# Patient Record
Sex: Male | Born: 1965 | Hispanic: Yes | Marital: Married | Smoking: Former smoker
Health system: Southern US, Community
[De-identification: ages and names within clinical notes are randomized; demographics above are authoritative.]

## PROBLEM LIST (undated history)

## (undated) DIAGNOSIS — E860 Dehydration: Secondary | ICD-10-CM

## (undated) DIAGNOSIS — F329 Major depressive disorder, single episode, unspecified: Secondary | ICD-10-CM

## (undated) DIAGNOSIS — I5042 Chronic combined systolic (congestive) and diastolic (congestive) heart failure: Secondary | ICD-10-CM

## (undated) DIAGNOSIS — R7989 Other specified abnormal findings of blood chemistry: Secondary | ICD-10-CM

## (undated) DIAGNOSIS — Z8673 Personal history of transient ischemic attack (TIA), and cerebral infarction without residual deficits: Secondary | ICD-10-CM

## (undated) DIAGNOSIS — E7800 Pure hypercholesterolemia, unspecified: Secondary | ICD-10-CM

## (undated) DIAGNOSIS — I2583 Coronary atherosclerosis due to lipid rich plaque: Secondary | ICD-10-CM

## (undated) DIAGNOSIS — I255 Ischemic cardiomyopathy: Secondary | ICD-10-CM

## (undated) DIAGNOSIS — E119 Type 2 diabetes mellitus without complications: Secondary | ICD-10-CM

## (undated) DIAGNOSIS — Z720 Tobacco use: Secondary | ICD-10-CM

## (undated) DIAGNOSIS — R55 Syncope and collapse: Secondary | ICD-10-CM

## (undated) DIAGNOSIS — I110 Hypertensive heart disease with heart failure: Secondary | ICD-10-CM

## (undated) DIAGNOSIS — I200 Unstable angina: Secondary | ICD-10-CM

## (undated) DIAGNOSIS — I214 Non-ST elevation (NSTEMI) myocardial infarction: Secondary | ICD-10-CM

## (undated) DIAGNOSIS — Z955 Presence of coronary angioplasty implant and graft: Secondary | ICD-10-CM

## (undated) DIAGNOSIS — I2510 Atherosclerotic heart disease of native coronary artery without angina pectoris: Secondary | ICD-10-CM

## (undated) DIAGNOSIS — D649 Anemia, unspecified: Secondary | ICD-10-CM

## (undated) DIAGNOSIS — F32A Depression, unspecified: Secondary | ICD-10-CM

## (undated) DIAGNOSIS — I10 Essential (primary) hypertension: Secondary | ICD-10-CM

## (undated) DIAGNOSIS — N186 End stage renal disease: Secondary | ICD-10-CM

## (undated) DIAGNOSIS — R079 Chest pain, unspecified: Secondary | ICD-10-CM

## (undated) DIAGNOSIS — E1121 Type 2 diabetes mellitus with diabetic nephropathy: Secondary | ICD-10-CM

## (undated) DIAGNOSIS — R778 Other specified abnormalities of plasma proteins: Secondary | ICD-10-CM

## (undated) DIAGNOSIS — Z992 Dependence on renal dialysis: Secondary | ICD-10-CM

## (undated) DIAGNOSIS — N419 Inflammatory disease of prostate, unspecified: Secondary | ICD-10-CM

## (undated) HISTORY — DX: Chest pain, unspecified: R07.9

## (undated) HISTORY — DX: Atherosclerotic heart disease of native coronary artery without angina pectoris: I25.10

## (undated) HISTORY — DX: Dehydration: E86.0

## (undated) HISTORY — DX: Essential (primary) hypertension: I10

## (undated) HISTORY — DX: Coronary atherosclerosis due to lipid rich plaque: I25.83

## (undated) HISTORY — DX: Personal history of transient ischemic attack (TIA), and cerebral infarction without residual deficits: Z86.73

## (undated) HISTORY — DX: Type 2 diabetes mellitus with diabetic nephropathy: E11.21

## (undated) HISTORY — DX: Presence of coronary angioplasty implant and graft: Z95.5

## (undated) HISTORY — DX: Other specified abnormal findings of blood chemistry: R79.89

## (undated) HISTORY — DX: Unstable angina: I20.0

## (undated) HISTORY — DX: Anemia, unspecified: D64.9

## (undated) HISTORY — DX: Syncope and collapse: R55

## (undated) HISTORY — DX: Hypertensive heart disease with heart failure: I11.0

## (undated) HISTORY — DX: Ischemic cardiomyopathy: I25.5

## (undated) HISTORY — DX: Other specified abnormalities of plasma proteins: R77.8

## (undated) HISTORY — DX: Chronic combined systolic (congestive) and diastolic (congestive) heart failure: I50.42

## (undated) NOTE — *Deleted (*Deleted)
Patient is a 21 year old male with multiple medical problems including CVA with chronic deficits, recent SNF placement, dialysis on Monday Wednesday Friday who is presenting today from skilled facility with hypoxia, respiratory distress.  Patient has recently been treated for pneumonia but symptoms have worsened.  He is awake and alert but has coarse breath sounds throughout, ongoing coughing and satting 100% on nonrebreather.  Patient sputum is green and thick.  He has no evidence of generalized fluid overload.  Patient was placed on high flow nasal cannula which improved his work of breathing.  X-ray shows right lower lobe pneumonia.  Leukocytosis of 23,000 and potassium at this time is normal.  Patient will need admission, IV antibiotics and dialysis while admitted.

---

## 2010-03-03 ENCOUNTER — Inpatient Hospital Stay (HOSPITAL_COMMUNITY)
Admission: EM | Admit: 2010-03-03 | Discharge: 2010-03-05 | Payer: Self-pay | Source: Home / Self Care | Attending: Interventional Cardiology | Admitting: Interventional Cardiology

## 2010-03-03 LAB — POCT CARDIAC MARKERS
CKMB, poc: <1 ng/mL — ABNORMAL LOW (ref 1.0–8.0)
Myoglobin, poc: 51.5 ng/mL (ref 12–200)
Troponin i, poc: <0.05 ng/mL (ref 0.00–0.09)
Troponin i, poc: <0.05 ng/mL (ref 0.00–0.09)

## 2010-03-03 LAB — CBC
HCT: 40.7 % (ref 39.0–52.0)
HCT: 41.6 % (ref 39.0–52.0)
Hemoglobin: 14.2 g/dL (ref 13.0–17.0)
MCH: 29.5 pg (ref 26.0–34.0)
MCHC: 33.7 g/dL (ref 30.0–36.0)
MCV: 87.5 fL (ref 78.0–100.0)
RDW: 12.6 % (ref 11.5–15.5)
WBC: 7.6 10*3/uL (ref 4.0–10.5)
WBC: 11.1 10*3/uL — ABNORMAL HIGH (ref 4.0–10.5)

## 2010-03-03 LAB — DIFFERENTIAL
Eosinophils Relative: 3 % (ref 0–5)
Lymphocytes Relative: 21 % (ref 12–46)
Lymphs Abs: 1.6 10*3/uL (ref 0.7–4.0)
Monocytes Absolute: 0.7 10*3/uL (ref 0.1–1.0)
Monocytes Relative: 9 % (ref 3–12)

## 2010-03-03 LAB — COMPREHENSIVE METABOLIC PANEL
ALT: 13 U/L (ref 0–53)
Alkaline Phosphatase: 113 U/L (ref 39–117)
Chloride: 103 mEq/L (ref 96–112)
Glucose, Bld: 280 mg/dL — ABNORMAL HIGH (ref 70–99)
Potassium: 4.4 mEq/L (ref 3.5–5.1)
Sodium: 137 mEq/L (ref 135–145)
Total Protein: 6.4 g/dL (ref 6.0–8.3)

## 2010-03-03 LAB — GLUCOSE, CAPILLARY: Glucose-Capillary: 139 mg/dL — ABNORMAL HIGH (ref 70–99)

## 2010-03-04 LAB — GLUCOSE, CAPILLARY
Glucose-Capillary: 189 mg/dL — ABNORMAL HIGH (ref 70–99)
Glucose-Capillary: 252 mg/dL — ABNORMAL HIGH (ref 70–99)
Glucose-Capillary: 109 mg/dL — ABNORMAL HIGH (ref 70–99)

## 2010-03-04 LAB — HEMOGLOBIN A1C
Hgb A1c MFr Bld: 11.1 % — ABNORMAL HIGH (ref <5.7)
Mean Plasma Glucose: 272 mg/dL — ABNORMAL HIGH (ref <117)

## 2010-03-05 LAB — LIPID PANEL
Total CHOL/HDL Ratio: 5.8 RATIO
VLDL: 37 mg/dL (ref 0–40)

## 2010-03-15 NOTE — Discharge Summary (Signed)
  Jeffery Young, JOSH NO.:  [id number]  MEDICAL RECORD NO.:  [id number]          PATIENT TYPE:  INP  LOCATION:  [id number]                         FACILITY:  [facility]  PHYSICIAN:  Ezzard Standing, M.D.DATE OF BIRTH:  09/30/1965  DATE OF ADMISSION:  03/03/2010 DATE OF DISCHARGE:  03/05/2010                              DISCHARGE SUMMARY   FINAL DIAGNOSES: 1. Chest pain of undetermined etiology with myocardial infarction     ruled out.     a.     Negative Cardiolite study for ischemia. 2. Tobacco abuse. 3. Non-insulin-dependent diabetes. 4. Hypertension. 5. Hyperlipidemia.  PROCEDURES:  Stress echo, Cardiolite stress test with Lexiscan.  HISTORY OF PRESENT ILLNESS:  This 45 year old Hispanic male moved from [place] recently to [place].  He has ran out of medicines and could not afford them and states his wife has insurance.  He has a history of diabetes for 5 years.  The morning prior to a getting ready for work, he developed sudden onset of substernal chest pressure lasting 30 minutes and resolved, it frightened him and he came to the ER for evaluation, was placed in the Cardiovascular  Diagnostic Unit.  Enzymes were negative.  A stress echocardiogram was negative for ischemia and he achieved a peak heart rate of 137 and stopped because of leg pain. There were no wall motion abnormalities noted on stress echo.  Dr. Tamala Julian admitted into the hospital for further evaluation and I am covering for him this weekend.  He has not had recurrent pain since admission and was to have a Cardiolite stress test in view of the indeterminate stress echo.  Please see the previously dictated history and physical for remainder of the details.  HOSPITAL COURSE:  Lab data showed a normal CBC.  Hemoglobin A1c is 11.1. Cholesterol is 181, triglycerides 185, HDL 31, LDL of 113.  Chemistry panel shows a glucose of 280.  Chest x-ray was normal.  Stress Myoview done on the 29 showed  diaphragmatic attenuation with no evidence of ischemia, nonspecific septal hypokinesis with an ejection fraction of 56%.  The stress echo showed vigorous LV systolic function, but was nondiagnostic because his heart rate was on adequate.  He had no recurrence of chest pain, was counseled to stop smoking.  He was discharged and was advised to get a primary care physician.  He was given referral to [facility].  DISCHARGE MEDICATIONS: 1. Metformin 500 mg b.i.d. resumed. 2. Simvastatin 20 mg daily. 3. Lisinopril 5 mg daily. 4. Aspirin 81 mg daily.  He is to follow up with [facility].  If he has recurrent pain, this should be followed up with Dr. Daneen Schick.     Ezzard Standing, M.D.     WST/MEDQ  D:  03/05/2010  T:  03/05/2010  Job:  [id number]  cc:   Belva Crome, M.D. Oncologist Signed by Viona Gilmore. Wynonia Lawman M.D. on 03/15/2010 09:22:41 AM

## 2010-03-16 NOTE — H&P (Signed)
Jeffery Young, Jeffery Young NO.:  [id number]  MEDICAL RECORD NO.:  [id number]          PATIENT TYPE:  INP  LOCATION:  [id number]                         FACILITY:  [facility]  PHYSICIAN:  Belva Crome, M.D.   DATE OF BIRTH:  03/11/1965  DATE OF ADMISSION:  03/03/2010 DATE OF DISCHARGE:                             HISTORY & PHYSICAL   No primary care physician, recently moved to [place] from [place].  REASON FOR ADMISSION:  Chest pain with suboptimal/indeterminate stress echo from CDU.  SUBJECTIVE:  The patient is 45 years of age.  He is a diabetic who is not on therapy.  He did not have insurance and ran out of medications and could not afford them.  He has a history of diabetes for approximately 5 years.  This morning prior to getting ready go to work, he developed sudden onset of substernal chest pressure.  It lasted approximately 30 minutes and then resolved.  It frightened him and he came to the ER for evaluation.  He was placed on CDU.  This morning he underwent a stress echo.  He was unable to achieve a target heart rate of 158 beats per minute.  He achieved a peak heart rate of 137. Exercise was stopped because of leg pain.  There was a question of EKG changes that were concerning.  There were no wall motion abnormalities noted on stress echo.  Cardiac markers x4 have been negative.  Chest pain has not recurred.  Because of the patient's risk profile, we felt that he needed to have cardiology consultation.  HABITS:  Smokes cigarettes one pack per day for greater than 20 years. Does not drink alcohol.  FAMILY HISTORY:  Mother died of cancer, had history of hypertension. Does not know his father's history.  He has one brother who had an MI at age 85 and other brothers had a CVA, age 53.  Two other brothers and one sister in good health.  ALLERGIES:  None.  MEDICATIONS:  Was previously taking metformin 500 mg b.i.d., simvastatin 10 mg per day, lisinopril 5 mg  per day.  Have not been on them for quite some time because he lost his insurance and is unable to afford the medications.  SOCIAL HISTORY:  He is married.  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  SIGNIFICANT PAST MEDICAL PROBLEMS: 1. Diabetes x6 years. 2. Hypertension. 3. Hyperlipidemia.  REVIEW OF SYSTEMS:  Has aches and pains in his muscles.  He had to stop walking on a treadmill at this point because both calves became really sore and tender.  He did not have any chest pain with exercise. Otherwise unremarkable ROS.  OBJECTIVE:  VITAL SIGNS:  Blood pressure 158/80, heart rate 60. SKIN:  Clear.  No xanthelasma. NECK:  No JVD or carotid bruits. LUNGS:  Clear. CARDIAC:  No gallop, rub, click, or murmur. ABDOMEN: Soft.  No bruits are heard. EXTREMITIES:  No edema.  Femoral pulses 2+ bilaterally.  Posterior tibial pulses 2+. NEUROLOGICAL:  Unremarkable.  EKG:  QRS pattern V1-V3.  Other laboratory data include potassium of 4.4, creatinine of 0.75, hemoglobin  of 13.7, troponin values were negative.  EKG is has said before unremarkable.  ASSESSMENT:  Atypical chest pain in a patient with multiple high risk markers.  He is an untreated diabetic.  He smokes cigarettes.  Has a family history of vascular disease.  His noninvasive test was nondiagnostic.  PLAN:  We will admit to the hospital for [facility] nuclear study.  If he has normal myocardial perfusion pattern, he will be discharged.  I will encourage him to resume therapy.     Belva Crome, M.D.     HWS/MEDQ  D:  03/03/2010  T:  03/04/2010  Job:  [id number]  Electronically Signed by Daneen Schick M.D. on 03/16/2010 05:00:40 PM

## 2011-04-11 ENCOUNTER — Encounter (INDEPENDENT_AMBULATORY_CARE_PROVIDER_SITE_OTHER): Payer: 59 | Admitting: Ophthalmology

## 2011-04-11 DIAGNOSIS — E11359 Type 2 diabetes mellitus with proliferative diabetic retinopathy without macular edema: Secondary | ICD-10-CM

## 2011-04-11 DIAGNOSIS — H4310 Vitreous hemorrhage, unspecified eye: Secondary | ICD-10-CM

## 2011-04-11 DIAGNOSIS — H43819 Vitreous degeneration, unspecified eye: Secondary | ICD-10-CM

## 2011-04-11 DIAGNOSIS — E11319 Type 2 diabetes mellitus with unspecified diabetic retinopathy without macular edema: Secondary | ICD-10-CM

## 2011-04-11 DIAGNOSIS — E1165 Type 2 diabetes mellitus with hyperglycemia: Secondary | ICD-10-CM

## 2011-04-11 DIAGNOSIS — H2510 Age-related nuclear cataract, unspecified eye: Secondary | ICD-10-CM

## 2011-04-11 DIAGNOSIS — S0510XA Contusion of eyeball and orbital tissues, unspecified eye, initial encounter: Secondary | ICD-10-CM

## 2011-05-14 ENCOUNTER — Encounter (INDEPENDENT_AMBULATORY_CARE_PROVIDER_SITE_OTHER): Payer: 59 | Admitting: Ophthalmology

## 2011-05-14 DIAGNOSIS — H3581 Retinal edema: Secondary | ICD-10-CM

## 2011-05-14 DIAGNOSIS — E11319 Type 2 diabetes mellitus with unspecified diabetic retinopathy without macular edema: Secondary | ICD-10-CM

## 2011-05-14 DIAGNOSIS — E11359 Type 2 diabetes mellitus with proliferative diabetic retinopathy without macular edema: Secondary | ICD-10-CM

## 2011-06-15 ENCOUNTER — Other Ambulatory Visit (INDEPENDENT_AMBULATORY_CARE_PROVIDER_SITE_OTHER): Payer: 59 | Admitting: Ophthalmology

## 2011-06-15 DIAGNOSIS — E1165 Type 2 diabetes mellitus with hyperglycemia: Secondary | ICD-10-CM

## 2011-06-15 DIAGNOSIS — E11359 Type 2 diabetes mellitus with proliferative diabetic retinopathy without macular edema: Secondary | ICD-10-CM

## 2011-10-15 ENCOUNTER — Ambulatory Visit (INDEPENDENT_AMBULATORY_CARE_PROVIDER_SITE_OTHER): Payer: 59 | Admitting: Ophthalmology

## 2011-10-15 DIAGNOSIS — E11319 Type 2 diabetes mellitus with unspecified diabetic retinopathy without macular edema: Secondary | ICD-10-CM

## 2011-10-15 DIAGNOSIS — H2510 Age-related nuclear cataract, unspecified eye: Secondary | ICD-10-CM

## 2011-10-15 DIAGNOSIS — H4310 Vitreous hemorrhage, unspecified eye: Secondary | ICD-10-CM

## 2011-10-15 DIAGNOSIS — E11359 Type 2 diabetes mellitus with proliferative diabetic retinopathy without macular edema: Secondary | ICD-10-CM

## 2011-10-15 DIAGNOSIS — E1165 Type 2 diabetes mellitus with hyperglycemia: Secondary | ICD-10-CM

## 2011-10-19 ENCOUNTER — Ambulatory Visit (INDEPENDENT_AMBULATORY_CARE_PROVIDER_SITE_OTHER): Payer: 59 | Admitting: Ophthalmology

## 2011-10-19 DIAGNOSIS — E1139 Type 2 diabetes mellitus with other diabetic ophthalmic complication: Secondary | ICD-10-CM

## 2011-10-19 DIAGNOSIS — E11311 Type 2 diabetes mellitus with unspecified diabetic retinopathy with macular edema: Secondary | ICD-10-CM

## 2011-10-19 DIAGNOSIS — E1165 Type 2 diabetes mellitus with hyperglycemia: Secondary | ICD-10-CM

## 2011-10-19 DIAGNOSIS — H2510 Age-related nuclear cataract, unspecified eye: Secondary | ICD-10-CM

## 2011-10-19 DIAGNOSIS — H4310 Vitreous hemorrhage, unspecified eye: Secondary | ICD-10-CM

## 2011-11-16 ENCOUNTER — Encounter (INDEPENDENT_AMBULATORY_CARE_PROVIDER_SITE_OTHER): Payer: 59 | Admitting: Ophthalmology

## 2011-12-14 ENCOUNTER — Encounter (INDEPENDENT_AMBULATORY_CARE_PROVIDER_SITE_OTHER): Payer: 59 | Admitting: Ophthalmology

## 2011-12-14 DIAGNOSIS — H35039 Hypertensive retinopathy, unspecified eye: Secondary | ICD-10-CM

## 2011-12-14 DIAGNOSIS — H43819 Vitreous degeneration, unspecified eye: Secondary | ICD-10-CM

## 2011-12-14 DIAGNOSIS — E1165 Type 2 diabetes mellitus with hyperglycemia: Secondary | ICD-10-CM

## 2011-12-14 DIAGNOSIS — H2700 Aphakia, unspecified eye: Secondary | ICD-10-CM

## 2011-12-14 DIAGNOSIS — I10 Essential (primary) hypertension: Secondary | ICD-10-CM

## 2011-12-14 DIAGNOSIS — E11359 Type 2 diabetes mellitus with proliferative diabetic retinopathy without macular edema: Secondary | ICD-10-CM

## 2011-12-14 DIAGNOSIS — E11311 Type 2 diabetes mellitus with unspecified diabetic retinopathy with macular edema: Secondary | ICD-10-CM

## 2012-01-09 ENCOUNTER — Encounter (INDEPENDENT_AMBULATORY_CARE_PROVIDER_SITE_OTHER): Payer: 59 | Admitting: Ophthalmology

## 2012-01-09 DIAGNOSIS — E11359 Type 2 diabetes mellitus with proliferative diabetic retinopathy without macular edema: Secondary | ICD-10-CM

## 2012-01-09 DIAGNOSIS — E11311 Type 2 diabetes mellitus with unspecified diabetic retinopathy with macular edema: Secondary | ICD-10-CM

## 2012-01-09 DIAGNOSIS — E1139 Type 2 diabetes mellitus with other diabetic ophthalmic complication: Secondary | ICD-10-CM

## 2012-01-09 DIAGNOSIS — H43819 Vitreous degeneration, unspecified eye: Secondary | ICD-10-CM

## 2012-01-09 DIAGNOSIS — H2510 Age-related nuclear cataract, unspecified eye: Secondary | ICD-10-CM

## 2012-02-11 ENCOUNTER — Encounter (INDEPENDENT_AMBULATORY_CARE_PROVIDER_SITE_OTHER): Payer: 59 | Admitting: Ophthalmology

## 2012-02-13 ENCOUNTER — Encounter (INDEPENDENT_AMBULATORY_CARE_PROVIDER_SITE_OTHER): Payer: 59 | Admitting: Ophthalmology

## 2012-02-13 DIAGNOSIS — E1165 Type 2 diabetes mellitus with hyperglycemia: Secondary | ICD-10-CM

## 2012-02-13 DIAGNOSIS — H43819 Vitreous degeneration, unspecified eye: Secondary | ICD-10-CM

## 2012-02-13 DIAGNOSIS — I10 Essential (primary) hypertension: Secondary | ICD-10-CM

## 2012-02-13 DIAGNOSIS — H2510 Age-related nuclear cataract, unspecified eye: Secondary | ICD-10-CM

## 2012-02-13 DIAGNOSIS — E11359 Type 2 diabetes mellitus with proliferative diabetic retinopathy without macular edema: Secondary | ICD-10-CM

## 2012-02-13 DIAGNOSIS — H35039 Hypertensive retinopathy, unspecified eye: Secondary | ICD-10-CM

## 2012-02-13 DIAGNOSIS — E11311 Type 2 diabetes mellitus with unspecified diabetic retinopathy with macular edema: Secondary | ICD-10-CM

## 2012-03-14 ENCOUNTER — Encounter (INDEPENDENT_AMBULATORY_CARE_PROVIDER_SITE_OTHER): Payer: BC Managed Care – PPO | Admitting: Ophthalmology

## 2012-08-21 ENCOUNTER — Emergency Department (HOSPITAL_COMMUNITY)
Admission: EM | Admit: 2012-08-21 | Discharge: 2012-08-22 | Disposition: A | Discharge status: Home / Self Care | Payer: BC Managed Care – PPO | Attending: Emergency Medicine | Admitting: Emergency Medicine

## 2012-08-21 ENCOUNTER — Encounter (HOSPITAL_COMMUNITY): Payer: Self-pay | Admitting: Adult Health

## 2012-08-21 ENCOUNTER — Emergency Department (HOSPITAL_COMMUNITY): Payer: BC Managed Care – PPO

## 2012-08-21 DIAGNOSIS — E119 Type 2 diabetes mellitus without complications: Secondary | ICD-10-CM | POA: Insufficient documentation

## 2012-08-21 DIAGNOSIS — F17200 Nicotine dependence, unspecified, uncomplicated: Secondary | ICD-10-CM | POA: Insufficient documentation

## 2012-08-21 DIAGNOSIS — E860 Dehydration: Secondary | ICD-10-CM | POA: Diagnosis present

## 2012-08-21 DIAGNOSIS — E7800 Pure hypercholesterolemia, unspecified: Secondary | ICD-10-CM | POA: Insufficient documentation

## 2012-08-21 DIAGNOSIS — R112 Nausea with vomiting, unspecified: Secondary | ICD-10-CM | POA: Insufficient documentation

## 2012-08-21 DIAGNOSIS — R42 Dizziness and giddiness: Secondary | ICD-10-CM | POA: Diagnosis present

## 2012-08-21 DIAGNOSIS — H5450 Low vision, one eye, unspecified eye: Secondary | ICD-10-CM | POA: Insufficient documentation

## 2012-08-21 DIAGNOSIS — R040 Epistaxis: Secondary | ICD-10-CM | POA: Insufficient documentation

## 2012-08-21 DIAGNOSIS — I10 Essential (primary) hypertension: Secondary | ICD-10-CM | POA: Insufficient documentation

## 2012-08-21 DIAGNOSIS — Z79899 Other long term (current) drug therapy: Secondary | ICD-10-CM | POA: Insufficient documentation

## 2012-08-21 HISTORY — DX: Pure hypercholesterolemia, unspecified: E78.00

## 2012-08-21 HISTORY — DX: Essential (primary) hypertension: I10

## 2012-08-21 LAB — URINALYSIS, ROUTINE W REFLEX MICROSCOPIC
Glucose, UA: NEGATIVE mg/dL
Ketones, ur: 15 mg/dL — AB
Leukocytes, UA: NEGATIVE
Nitrite: NEGATIVE
Protein, ur: 100 mg/dL — AB

## 2012-08-21 LAB — COMPREHENSIVE METABOLIC PANEL
ALT: 13 U/L (ref 0–53)
Albumin: 3.8 g/dL (ref 3.5–5.2)
Alkaline Phosphatase: 102 U/L (ref 39–117)
BUN: 20 mg/dL (ref 6–23)
Calcium: 9.7 mg/dL (ref 8.4–10.5)
Potassium: 4.3 mEq/L (ref 3.5–5.1)
Sodium: 135 mEq/L (ref 135–145)
Total Protein: 7.4 g/dL (ref 6.0–8.3)

## 2012-08-21 LAB — GLUCOSE, CAPILLARY: Glucose-Capillary: 123 mg/dL — ABNORMAL HIGH (ref 70–99)

## 2012-08-21 LAB — CBC
MCH: 30.4 pg (ref 26.0–34.0)
MCHC: 34.3 g/dL (ref 30.0–36.0)
Platelets: 195 10*3/uL (ref 150–400)
RDW: 12.6 % (ref 11.5–15.5)

## 2012-08-21 LAB — URINE MICROSCOPIC-ADD ON

## 2012-08-21 LAB — DIFFERENTIAL
Basophils Relative: 0 % (ref 0–1)
Eosinophils Absolute: 0.1 10*3/uL (ref 0.0–0.7)
Eosinophils Relative: 1 % (ref 0–5)
Neutrophils Relative %: 68 % (ref 43–77)

## 2012-08-21 LAB — POCT I-STAT TROPONIN I

## 2012-08-21 LAB — PROTIME-INR
INR: 0.98 (ref 0.00–1.49)
Prothrombin Time: 12.8 seconds (ref 11.6–15.2)

## 2012-08-21 LAB — TROPONIN I: Troponin I: <0.3 ng/mL (ref <0.30)

## 2012-08-21 MED ORDER — SODIUM CHLORIDE 0.9 % IV BOLUS (SEPSIS)
1000.0000 mL | Freq: Once | INTRAVENOUS | Status: AC
  Administered 2012-08-21: 1000 mL via INTRAVENOUS

## 2012-08-21 NOTE — ED Provider Notes (Signed)
I saw and evaluated the patient, reviewed the resident's note and I agree with the findings and plan.  Subjective: Patient here with transient dizziness while at work which started with abdominal discomfort. No anginal chest pain. No shortness of breath.  Objective: Afebrile vital signs stable. Labs unremarkable.  Assessment/plan: Workup here has been negative. Stable for discharge  Leota Jacobsen, MD 08/21/12 2321

## 2012-08-21 NOTE — ED Notes (Addendum)
Presents with "I feel off balanced. Like I am drunk but I am not. The back of my neck hurts and feels stiff and I have a minor headache. I just feel sluggish all day since 04:30 this am when I got up" CBG 123, BP 119/83. Pt alert and oriented, no neuro deficits. The feeling of off balance came on gradually. "I am not dizzy, the room is not spinning. Its just me. I feel like I am drunk and I am not"  No drift, no droop noted.  Denies numbness, tingling and weakness. Answers all questions appropriately.

## 2012-08-21 NOTE — ED Notes (Signed)
Urinal given for urine sample. Pt is aware of the sample needed

## 2012-08-21 NOTE — ED Provider Notes (Signed)
History    CSN: [id number] Arrival date & time 08/21/12  [id number]  First MD Initiated Contact with Patient 08/21/12 2037     Chief Complaint  Patient presents with  . Dizziness   (Consider location/radiation/quality/duration/timing/severity/associated sxs/prior Treatment) HPI  Jeffery Young 47 y.o. with high blood pressure, diabetes and high cholesterol presents emergency department for lightheadedness, nausea, and vomiting. It started this afternoon approximately 4:30 PM. Lightheadedness occurred after work. Is unchanged. Worsened when standing for a long period of time or quickly standing up. Nausea and vomiting onset at after the lightheadedness. Emesis is nonbilious and nonbloody. He had 2 episodes. Following the second episode of vomiting patient had epistaxis of the left nares, which resolved with gentle pressure. No history of frequent nosebleeds or easy bleeding. He denies headache, visual changes, slurred speech, dysphagia, weakness, numbness or tingling. Patient did note he has not been drinking as much water as usual.  Past Medical History  Diagnosis Date  . Hypertension   . Diabetes mellitus without complication   . Hypercholesteremia    History reviewed. No pertinent past surgical history. History reviewed. No pertinent family history. History  Substance Use Topics  . Smoking status: Current Every Day Smoker    Types: Cigarettes  . Smokeless tobacco: Not on file  . Alcohol Use: Yes    Review of Systems  Constitutional: Negative for fever, chills, diaphoresis, activity change, appetite change and fatigue.  HENT: Positive for nosebleeds. Negative for congestion, sore throat, rhinorrhea, sneezing and trouble swallowing.   Eyes: Negative for pain and redness.  Respiratory: Negative for cough, choking, chest tightness, shortness of breath, wheezing and stridor.   Cardiovascular: Negative for chest pain and leg swelling.  Gastrointestinal: Positive for nausea and vomiting.  Negative for diarrhea, constipation, blood in stool, abdominal distention and anal bleeding.  Musculoskeletal: Negative for myalgias and back pain.  Skin: Negative for rash.  Neurological: Positive for light-headedness. Negative for dizziness, speech difficulty, weakness, numbness and headaches.  Hematological: Negative for adenopathy.  Psychiatric/Behavioral: Negative for confusion.    Allergies  Review of patient's allergies indicates no known allergies.  Home Medications   Current Outpatient Rx  Name  Route  Sig  Dispense  Refill  . lisinopril (PRINIVIL,ZESTRIL) 5 MG tablet   Oral   Take 5 mg by mouth daily.         . metFORMIN (GLUCOPHAGE) 1000 MG tablet   Oral   Take 1,000 mg by mouth 2 (two) times daily with a meal.         . simvastatin (ZOCOR) 20 MG tablet   Oral   Take 20 mg by mouth every evening.          BP 153/75  Pulse 67  Temp(Src) 98.7 F (37.1 C) (Oral)  Resp 23  Ht 5\' 5"  (1.651 m)  Wt 139 lb (63.05 kg)  BMI 23.13 kg/m2  SpO2 95% Physical Exam  Nursing note and vitals reviewed. Constitutional: He is oriented to person, place, and time. He appears well-developed and well-nourished. No distress.  HENT:  Head: Normocephalic and atraumatic.  Nose: Mucosal edema present. No rhinorrhea. No epistaxis (dry blood in the left nares).  Eyes: Conjunctivae and EOM are normal. Right eye exhibits no discharge. Left eye exhibits no discharge. Right pupil is not reactive. Right pupil is round. 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 He does note having asymmetrical pupils are). Left pupil is round.  Neck:  Normal range of motion. Neck supple. No tracheal deviation present.  Cardiovascular: Normal rate, regular rhythm and normal heart sounds.  Exam reveals no friction rub.   No murmur heard. Pulmonary/Chest: Effort normal and breath sounds normal. No stridor. No respiratory distress. He has no wheezes.  He has no rales. He exhibits no tenderness.  Abdominal: Soft. He exhibits no distension. There is no tenderness. There is no rebound and no guarding.  Neurological: He is alert and oriented to person, place, and time. A cranial nerve deficit (3 through 12 grossly intact bilaterally) is present. No sensory deficit. He exhibits normal muscle tone (Strength in the bilateral upper extremities and lower  extremities intact and symmetric bilaterally). GCS eye subscore is 4. GCS verbal subscore is 5. GCS motor subscore is 6.  Skin: Skin is warm.  Psychiatric: He has a normal mood and affect.    ED Course  Procedures (including critical care time) Labs Reviewed  GLUCOSE, CAPILLARY - Abnormal; Notable for the following:    Glucose-Capillary 123 (*)    All other components within normal limits  COMPREHENSIVE METABOLIC PANEL - Abnormal; Notable for the following:    Glucose, Bld 129 (*)    All other components within normal limits  URINALYSIS, ROUTINE W REFLEX MICROSCOPIC - Abnormal; Notable for the following:    Hgb urine dipstick MODERATE (*)    Ketones, ur 15 (*)    Protein, ur 100 (*)    All other components within normal limits  PROTIME-INR  APTT  CBC  DIFFERENTIAL  TROPONIN I  URINE MICROSCOPIC-ADD ON  POCT I-STAT TROPONIN I   Ct Head (brain) Wo Contrast  08/21/2012   *RADIOLOGY REPORT*  Clinical Data: Dizziness  CT HEAD WITHOUT CONTRAST  Technique:  Contiguous axial images were obtained from the base of the skull through the vertex without contrast.  Comparison: None.  Findings: No acute hemorrhage, acute infarction, or mass lesion is identified.  No midline shift.  No ventriculomegaly.  No skull fracture.  Mild ethmoid mucoperiosteal thickening noted.  IMPRESSION: No acute intracranial finding.  Mild ethmoid sinusitis.   Original Report Authenticated By: Conchita [place], M.D.   Results for orders placed during the hospital encounter of 08/21/12  GLUCOSE, CAPILLARY      Result Value  Range   Glucose-Capillary 123 (*) 70 - 99 mg/dL   Comment 1 Notify RN     Comment 2 Documented in Chart    PROTIME-INR      Result Value Range   Prothrombin Time 12.8  11.6 - 15.2 seconds   INR 0.98  0.00 - 1.49  APTT      Result Value Range   aPTT 32  24 - 37 seconds  CBC      Result Value Range   WBC 9.5  4.0 - 10.5 K/uL   RBC 4.87  4.22 - 5.81 MIL/uL   Hemoglobin 14.8  13.0 - 17.0 g/dL   HCT 43.2  39.0 - 52.0 %   MCV 88.7  78.0 - 100.0 fL   MCH 30.4  26.0 - 34.0 pg   MCHC 34.3  30.0 - 36.0 g/dL   RDW 12.6  11.5 - 15.5 %   Platelets 195  150 - 400 K/uL  DIFFERENTIAL      Result Value Range   Neutrophils Relative % 68  43 - 77 %   Neutro Abs 6.4  1.7 - 7.7 K/uL   Lymphocytes Relative 24  12 - 46 %   Lymphs Abs  2.3  0.7 - 4.0 K/uL   Monocytes Relative 7  3 - 12 %   Monocytes Absolute 0.6  0.1 - 1.0 K/uL   Eosinophils Relative 1  0 - 5 %   Eosinophils Absolute 0.1  0.0 - 0.7 K/uL   Basophils Relative 0  0 - 1 %   Basophils Absolute 0.0  0.0 - 0.1 K/uL  COMPREHENSIVE METABOLIC PANEL      Result Value Range   Sodium 135  135 - 145 mEq/L   Potassium 4.3  3.5 - 5.1 mEq/L   Chloride 98  96 - 112 mEq/L   CO2 29  19 - 32 mEq/L   Glucose, Bld 129 (*) 70 - 99 mg/dL   BUN 20  6 - 23 mg/dL   Creatinine, Ser 0.96  0.50 - 1.35 mg/dL   Calcium 9.7  8.4 - 10.5 mg/dL   Total Protein 7.4  6.0 - 8.3 g/dL   Albumin 3.8  3.5 - 5.2 g/dL   AST 15  0 - 37 U/L   ALT 13  0 - 53 U/L   Alkaline Phosphatase 102  39 - 117 U/L   Total Bilirubin 0.3  0.3 - 1.2 mg/dL   GFR calc non Af Amer >90  >90 mL/min   GFR calc Af Amer >90  >90 mL/min  TROPONIN I      Result Value Range   Troponin I <0.30  <0.30 ng/mL  URINALYSIS, ROUTINE W REFLEX MICROSCOPIC      Result Value Range   Color, Urine YELLOW  YELLOW   APPearance CLEAR  CLEAR   Specific Gravity, Urine 1.013  1.005 - 1.030   pH 6.0  5.0 - 8.0   Glucose, UA NEGATIVE  NEGATIVE mg/dL   Hgb urine dipstick MODERATE (*) NEGATIVE   Bilirubin  Urine NEGATIVE  NEGATIVE   Ketones, ur 15 (*) NEGATIVE mg/dL   Protein, ur 100 (*) NEGATIVE mg/dL   Urobilinogen, UA 1.0  0.0 - 1.0 mg/dL   Nitrite NEGATIVE  NEGATIVE   Leukocytes, UA NEGATIVE  NEGATIVE  URINE MICROSCOPIC-ADD ON      Result Value Range   Squamous Epithelial / LPF RARE  RARE   WBC, UA 0-2  <3 WBC/hpf   RBC / HPF 0-2  <3 RBC/hpf   Bacteria, UA RARE  RARE  POCT I-STAT TROPONIN I      Result Value Range   Troponin i, poc 0.00  0.00 - 0.08 ng/mL   Comment 3              1. Lightheadedness   2. Dehydration     MDM  Eduardo Menge 48 y.o. presents after lightheadedness, nausea, vomiting and epistaxis. Symptoms have improved at bedside. He's afebrile vital signs are stable. No acute distress. No focal deficits on exam. As noted above, patient's asymmetric pupils are a baseline finding. He is alert and oriented x3. Moving all 4 extremities freely. Patient reflexively received workup including a CT scan. CT negative for acute intracranial abnormality, although some ethmoid sinusitis noted. Troponin negative. EKG unchanged from baseline. No murmurs on exam. Doubt patients having symptoms related ACS. Blood glucose within normal limits. Electrolytes stable. UA suggestive of dehydration. Patient given IV fluids in the emergency partner. Symptoms likely related dehydration. On reexam patient was feeling much better. Patient states discharge home was agreeable to plan. He was given strong return to cautions and instructed to follow up with his PCP, which he can do. Labs and  imaging reviewed. I discussed this patient's care with my attending, Dr. Zenia Resides.   Kelby Aline, MD 08/22/12 [phone]

## 2012-08-22 DIAGNOSIS — E860 Dehydration: Secondary | ICD-10-CM

## 2012-08-22 DIAGNOSIS — R42 Dizziness and giddiness: Secondary | ICD-10-CM | POA: Diagnosis present

## 2012-08-22 HISTORY — DX: Dehydration: E86.0

## 2012-08-23 NOTE — ED Provider Notes (Signed)
I saw and evaluated the patient, reviewed the resident's note and I agree with the findings and plan.   Leota Jacobsen, MD 08/23/12 [phone]

## 2012-09-23 ENCOUNTER — Encounter (INDEPENDENT_AMBULATORY_CARE_PROVIDER_SITE_OTHER): Payer: BC Managed Care – PPO | Admitting: Ophthalmology

## 2012-09-23 DIAGNOSIS — H4310 Vitreous hemorrhage, unspecified eye: Secondary | ICD-10-CM

## 2012-09-23 DIAGNOSIS — E11311 Type 2 diabetes mellitus with unspecified diabetic retinopathy with macular edema: Secondary | ICD-10-CM

## 2012-09-23 DIAGNOSIS — H35039 Hypertensive retinopathy, unspecified eye: Secondary | ICD-10-CM

## 2012-09-23 DIAGNOSIS — E1139 Type 2 diabetes mellitus with other diabetic ophthalmic complication: Secondary | ICD-10-CM

## 2012-09-23 DIAGNOSIS — I10 Essential (primary) hypertension: Secondary | ICD-10-CM

## 2012-09-23 DIAGNOSIS — E11359 Type 2 diabetes mellitus with proliferative diabetic retinopathy without macular edema: Secondary | ICD-10-CM

## 2012-10-16 ENCOUNTER — Encounter (INDEPENDENT_AMBULATORY_CARE_PROVIDER_SITE_OTHER): Payer: BC Managed Care – PPO | Admitting: Ophthalmology

## 2012-10-16 DIAGNOSIS — E1139 Type 2 diabetes mellitus with other diabetic ophthalmic complication: Secondary | ICD-10-CM

## 2012-10-16 DIAGNOSIS — I10 Essential (primary) hypertension: Secondary | ICD-10-CM

## 2012-10-16 DIAGNOSIS — H35039 Hypertensive retinopathy, unspecified eye: Secondary | ICD-10-CM

## 2012-10-16 DIAGNOSIS — E11359 Type 2 diabetes mellitus with proliferative diabetic retinopathy without macular edema: Secondary | ICD-10-CM

## 2012-10-16 DIAGNOSIS — H4310 Vitreous hemorrhage, unspecified eye: Secondary | ICD-10-CM

## 2012-10-16 DIAGNOSIS — E11311 Type 2 diabetes mellitus with unspecified diabetic retinopathy with macular edema: Secondary | ICD-10-CM

## 2012-11-13 ENCOUNTER — Encounter (INDEPENDENT_AMBULATORY_CARE_PROVIDER_SITE_OTHER): Payer: BC Managed Care – PPO | Admitting: Ophthalmology

## 2012-11-13 DIAGNOSIS — E11359 Type 2 diabetes mellitus with proliferative diabetic retinopathy without macular edema: Secondary | ICD-10-CM

## 2012-11-13 DIAGNOSIS — E11311 Type 2 diabetes mellitus with unspecified diabetic retinopathy with macular edema: Secondary | ICD-10-CM

## 2012-11-13 DIAGNOSIS — H35039 Hypertensive retinopathy, unspecified eye: Secondary | ICD-10-CM

## 2012-11-13 DIAGNOSIS — H43819 Vitreous degeneration, unspecified eye: Secondary | ICD-10-CM

## 2012-11-13 DIAGNOSIS — E1139 Type 2 diabetes mellitus with other diabetic ophthalmic complication: Secondary | ICD-10-CM

## 2012-11-13 DIAGNOSIS — I10 Essential (primary) hypertension: Secondary | ICD-10-CM

## 2012-11-13 DIAGNOSIS — H2510 Age-related nuclear cataract, unspecified eye: Secondary | ICD-10-CM

## 2012-12-12 ENCOUNTER — Encounter (HOSPITAL_COMMUNITY): Payer: Self-pay | Admitting: Emergency Medicine

## 2012-12-12 ENCOUNTER — Emergency Department (HOSPITAL_COMMUNITY)
Admission: EM | Admit: 2012-12-12 | Discharge: 2012-12-12 | Disposition: A | Discharge status: Home / Self Care | Payer: BC Managed Care – PPO | Attending: Emergency Medicine | Admitting: Emergency Medicine

## 2012-12-12 ENCOUNTER — Encounter (INDEPENDENT_AMBULATORY_CARE_PROVIDER_SITE_OTHER): Payer: BC Managed Care – PPO | Admitting: Ophthalmology

## 2012-12-12 DIAGNOSIS — R739 Hyperglycemia, unspecified: Secondary | ICD-10-CM

## 2012-12-12 DIAGNOSIS — I10 Essential (primary) hypertension: Secondary | ICD-10-CM | POA: Insufficient documentation

## 2012-12-12 DIAGNOSIS — F17200 Nicotine dependence, unspecified, uncomplicated: Secondary | ICD-10-CM | POA: Insufficient documentation

## 2012-12-12 DIAGNOSIS — E7800 Pure hypercholesterolemia, unspecified: Secondary | ICD-10-CM | POA: Insufficient documentation

## 2012-12-12 DIAGNOSIS — E119 Type 2 diabetes mellitus without complications: Secondary | ICD-10-CM | POA: Insufficient documentation

## 2012-12-12 DIAGNOSIS — Z79899 Other long term (current) drug therapy: Secondary | ICD-10-CM | POA: Insufficient documentation

## 2012-12-12 LAB — BASIC METABOLIC PANEL
BUN: 22 mg/dL (ref 6–23)
Calcium: 9.3 mg/dL (ref 8.4–10.5)
Chloride: 100 mEq/L (ref 96–112)
Creatinine, Ser: 1.03 mg/dL (ref 0.50–1.35)
GFR calc Af Amer: >90 mL/min (ref >90)
GFR calc non Af Amer: 85 mL/min — ABNORMAL LOW (ref >90)

## 2012-12-12 LAB — CBC WITH DIFFERENTIAL/PLATELET
Basophils Absolute: 0.1 10*3/uL (ref 0.0–0.1)
Basophils Relative: 1 % (ref 0–1)
Eosinophils Absolute: 0.2 10*3/uL (ref 0.0–0.7)
Eosinophils Relative: 3 % (ref 0–5)
HCT: 42.7 % (ref 39.0–52.0)
Hemoglobin: 15 g/dL (ref 13.0–17.0)
MCH: 31.7 pg (ref 26.0–34.0)
MCHC: 35.1 g/dL (ref 30.0–36.0)
MCV: 90.3 fL (ref 78.0–100.0)
Monocytes Absolute: 0.7 10*3/uL (ref 0.1–1.0)
Monocytes Relative: 7 % (ref 3–12)
Neutro Abs: 5.9 10*3/uL (ref 1.7–7.7)
RDW: 12.6 % (ref 11.5–15.5)

## 2012-12-12 LAB — GLUCOSE, CAPILLARY: Glucose-Capillary: 232 mg/dL — ABNORMAL HIGH (ref 70–99)

## 2012-12-12 MED ORDER — SODIUM CHLORIDE 0.9 % IV BOLUS (SEPSIS)
1000.0000 mL | Freq: Once | INTRAVENOUS | Status: AC
  Administered 2012-12-12: 1000 mL via INTRAVENOUS

## 2012-12-12 NOTE — ED Provider Notes (Signed)
CSN: [id number]     Arrival date & time 12/12/12  1736 History   First MD Initiated Contact with Patient 12/12/12 1955     Chief Complaint  Patient presents with  . Dizziness   (Consider location/radiation/quality/duration/timing/severity/associated sxs/prior Treatment) HPI  Jeffery Young is a 47 y.o.male with a significant PMH of diabetes, hypertension, smoking, hypercholesterolemia  presents to the ER with complaints of light headedness for 2 days. He says that he has not been dizzy but has been able to tell that something does not feel right. Both of his brothers are currently in the hospital and he has been under a lot of stress. He has not had any other symptoms of headache, fevers, neck pain, nausea, vomiting, diarrhea, weakness (generalized or focal). His symptoms do not get worse with movement. He does not feel as though he is spinning or rocking on a boat. He says he is otherwise pretty healthy and active.    Past Medical History  Diagnosis Date  . Hypertension   . Diabetes mellitus without complication   . Hypercholesteremia    History reviewed. No pertinent past surgical history. No family history on file. History  Substance Use Topics  . Smoking status: Current Every Day Smoker    Types: Cigarettes  . Smokeless tobacco: Not on file  . Alcohol Use: Yes    Review of Systems The patient denies anorexia, fever, weight loss,, vision loss, decreased hearing, hoarseness, chest pain, syncope, dyspnea on exertion, peripheral edema, balance deficits, hemoptysis, abdominal pain, melena, hematochezia, severe indigestion/heartburn, hematuria, incontinence, genital sores, muscle weakness, suspicious skin lesions, transient blindness, difficulty walking, depression, unusual weight change, abnormal bleeding, enlarged lymph nodes, angioedema, and breast masses.  Allergies  Review of patient's allergies indicates no known allergies.  Home Medications   Current Outpatient Rx  Name   Route  Sig  Dispense  Refill  . lisinopril (PRINIVIL,ZESTRIL) 5 MG tablet   Oral   Take 5 mg by mouth daily.         . metFORMIN (GLUCOPHAGE) 1000 MG tablet   Oral   Take 1,000 mg by mouth 2 (two) times daily with a meal.         . simvastatin (ZOCOR) 20 MG tablet   Oral   Take 20 mg by mouth every morning.           BP 172/90  Pulse 84  Temp(Src) 98.7 F (37.1 C) (Oral)  Resp 18  Ht 5\' 5"  (1.651 m)  Wt 140 lb (63.504 kg)  BMI 23.30 kg/m2  SpO2 100% Physical Exam  Nursing note and vitals reviewed. Constitutional: He is oriented to person, place, and time. He appears well-developed and well-nourished. No distress.  HENT:  Head: Normocephalic and atraumatic.  Eyes: Pupils are equal, round, and reactive to light.  Neck: Normal range of motion. Neck supple.  Cardiovascular: Normal rate and regular rhythm.   Pulmonary/Chest: Effort normal.  Abdominal: Soft. Bowel sounds are normal. There is no tenderness. There is no rebound.  Neurological: He is alert and oriented to person, place, and time. He has normal strength. No cranial nerve deficit or sensory deficit. GCS eye subscore is 4. GCS verbal subscore is 5. GCS motor subscore is 6.  Skin: Skin is warm and dry.    ED Course  Procedures (including critical care time) Labs Review Labs Reviewed  BASIC METABOLIC PANEL - Abnormal; Notable for the following:    Glucose, Bld 218 (*)    GFR calc non Af Wyvonnia Lora  85 (*)    All other components within normal limits  GLUCOSE, CAPILLARY - Abnormal; Notable for the following:    Glucose-Capillary 232 (*)    All other components within normal limits  GLUCOSE, CAPILLARY - Abnormal; Notable for the following:    Glucose-Capillary 125 (*)    All other components within normal limits  CBC WITH DIFFERENTIAL   Imaging Review No results found.  EKG Interpretation   None       MDM   1. Hyperglycemia     8:53 pm Patient sugar is noted to be elevated at 232 which he says is  very high for him. He feels as though his sugar is higher than normal because of all the stress that he is under. We discussed rehydrating patient and then re-evaluating. He is not showing any signs or symptoms of a stroke or head bleed at this time. The patient feels comfortable with the plan.  10:30pm After re-evaluation patient is feeling much better. His CBG is now 132 and he is asymptomatic. Pt advised to drink more water and see PCP for help with further managing his stress.  47 y.o.Jeffery Young's evaluation in the Emergency Department is complete. It has been determined that no acute conditions requiring further emergency intervention are present at this time. The patient/guardian have been advised of the diagnosis and plan. We have discussed signs and symptoms that warrant return to the ED, such as changes or worsening in symptoms.  Vital signs are stable at discharge. Filed Vitals:   12/12/12 2100  BP: 172/90  Pulse: 84  Temp:   Resp: 18    Patient/guardian has voiced understanding and agreed to follow-up with the PCP or specialist.   Linus Mako, PA-C 12/12/12 2231

## 2012-12-12 NOTE — ED Notes (Signed)
Pt states that he has multiple stressors in his life right now, include stress at work, marital problems, and multiple sick person in his family.

## 2012-12-12 NOTE — ED Notes (Signed)
The pt is c/o dizziness for 2 days with some pain in the back of his neck.  Earlier today he turned his head at work and he felt a numbness in the back of his neck also

## 2012-12-16 NOTE — ED Provider Notes (Signed)
Medical screening examination/treatment/procedure(s) were conducted as a shared visit with non-physician practitioner(s) and myself.  I personally evaluated the patient during the encounter.  EKG Interpretation     Ventricular Rate:  107 PR Interval:  148 QRS Duration: 74 QT Interval:  310 QTC Calculation: 413 R Axis:   77 Text Interpretation:  Sinus tachycardia Septal infarct , age undetermined Abnormal ECG ED PHYSICIAN INTERPRETATION AVAILABLE IN [facility] [place]           Patient is a diabetic and feels lightheaded for 2 days. Glucose elevated mildly.  Patient feels better after IV hydration  Nat Christen, MD 12/16/12 [phone]

## 2012-12-22 ENCOUNTER — Encounter (INDEPENDENT_AMBULATORY_CARE_PROVIDER_SITE_OTHER): Payer: BC Managed Care – PPO | Admitting: Ophthalmology

## 2012-12-22 DIAGNOSIS — E1139 Type 2 diabetes mellitus with other diabetic ophthalmic complication: Secondary | ICD-10-CM

## 2012-12-22 DIAGNOSIS — H43819 Vitreous degeneration, unspecified eye: Secondary | ICD-10-CM

## 2012-12-22 DIAGNOSIS — H2510 Age-related nuclear cataract, unspecified eye: Secondary | ICD-10-CM

## 2012-12-22 DIAGNOSIS — H35039 Hypertensive retinopathy, unspecified eye: Secondary | ICD-10-CM

## 2012-12-22 DIAGNOSIS — I10 Essential (primary) hypertension: Secondary | ICD-10-CM

## 2012-12-22 DIAGNOSIS — E11311 Type 2 diabetes mellitus with unspecified diabetic retinopathy with macular edema: Secondary | ICD-10-CM

## 2012-12-22 DIAGNOSIS — E11359 Type 2 diabetes mellitus with proliferative diabetic retinopathy without macular edema: Secondary | ICD-10-CM

## 2013-01-05 ENCOUNTER — Other Ambulatory Visit (INDEPENDENT_AMBULATORY_CARE_PROVIDER_SITE_OTHER): Payer: BC Managed Care – PPO | Admitting: Ophthalmology

## 2013-01-05 DIAGNOSIS — E1139 Type 2 diabetes mellitus with other diabetic ophthalmic complication: Secondary | ICD-10-CM

## 2013-01-05 DIAGNOSIS — H3581 Retinal edema: Secondary | ICD-10-CM

## 2013-01-16 ENCOUNTER — Encounter (INDEPENDENT_AMBULATORY_CARE_PROVIDER_SITE_OTHER): Payer: BC Managed Care – PPO | Admitting: Ophthalmology

## 2013-01-16 DIAGNOSIS — H2510 Age-related nuclear cataract, unspecified eye: Secondary | ICD-10-CM

## 2013-01-16 DIAGNOSIS — E11311 Type 2 diabetes mellitus with unspecified diabetic retinopathy with macular edema: Secondary | ICD-10-CM

## 2013-01-16 DIAGNOSIS — E1139 Type 2 diabetes mellitus with other diabetic ophthalmic complication: Secondary | ICD-10-CM

## 2013-01-16 DIAGNOSIS — E11359 Type 2 diabetes mellitus with proliferative diabetic retinopathy without macular edema: Secondary | ICD-10-CM

## 2013-01-16 DIAGNOSIS — H43819 Vitreous degeneration, unspecified eye: Secondary | ICD-10-CM

## 2013-01-16 DIAGNOSIS — H35039 Hypertensive retinopathy, unspecified eye: Secondary | ICD-10-CM

## 2013-01-16 DIAGNOSIS — H4310 Vitreous hemorrhage, unspecified eye: Secondary | ICD-10-CM

## 2013-01-16 DIAGNOSIS — I10 Essential (primary) hypertension: Secondary | ICD-10-CM

## 2013-02-13 ENCOUNTER — Encounter (INDEPENDENT_AMBULATORY_CARE_PROVIDER_SITE_OTHER): Payer: BC Managed Care – PPO | Admitting: Ophthalmology

## 2013-02-13 DIAGNOSIS — H43819 Vitreous degeneration, unspecified eye: Secondary | ICD-10-CM

## 2013-02-13 DIAGNOSIS — E11311 Type 2 diabetes mellitus with unspecified diabetic retinopathy with macular edema: Secondary | ICD-10-CM

## 2013-02-13 DIAGNOSIS — E1139 Type 2 diabetes mellitus with other diabetic ophthalmic complication: Secondary | ICD-10-CM

## 2013-02-13 DIAGNOSIS — I10 Essential (primary) hypertension: Secondary | ICD-10-CM

## 2013-02-13 DIAGNOSIS — E1165 Type 2 diabetes mellitus with hyperglycemia: Secondary | ICD-10-CM

## 2013-02-13 DIAGNOSIS — H35039 Hypertensive retinopathy, unspecified eye: Secondary | ICD-10-CM

## 2013-02-13 DIAGNOSIS — E11359 Type 2 diabetes mellitus with proliferative diabetic retinopathy without macular edema: Secondary | ICD-10-CM

## 2013-03-13 ENCOUNTER — Encounter (INDEPENDENT_AMBULATORY_CARE_PROVIDER_SITE_OTHER): Payer: BC Managed Care – PPO | Admitting: Ophthalmology

## 2013-03-19 ENCOUNTER — Encounter (INDEPENDENT_AMBULATORY_CARE_PROVIDER_SITE_OTHER): Payer: BC Managed Care – PPO | Admitting: Ophthalmology

## 2013-03-19 DIAGNOSIS — E1165 Type 2 diabetes mellitus with hyperglycemia: Secondary | ICD-10-CM

## 2013-03-19 DIAGNOSIS — H43819 Vitreous degeneration, unspecified eye: Secondary | ICD-10-CM

## 2013-03-19 DIAGNOSIS — I10 Essential (primary) hypertension: Secondary | ICD-10-CM

## 2013-03-19 DIAGNOSIS — H2510 Age-related nuclear cataract, unspecified eye: Secondary | ICD-10-CM

## 2013-03-19 DIAGNOSIS — E1139 Type 2 diabetes mellitus with other diabetic ophthalmic complication: Secondary | ICD-10-CM

## 2013-03-19 DIAGNOSIS — H35039 Hypertensive retinopathy, unspecified eye: Secondary | ICD-10-CM

## 2013-03-19 DIAGNOSIS — E11311 Type 2 diabetes mellitus with unspecified diabetic retinopathy with macular edema: Secondary | ICD-10-CM

## 2013-03-19 DIAGNOSIS — E11359 Type 2 diabetes mellitus with proliferative diabetic retinopathy without macular edema: Secondary | ICD-10-CM

## 2013-04-16 ENCOUNTER — Encounter (INDEPENDENT_AMBULATORY_CARE_PROVIDER_SITE_OTHER): Payer: BC Managed Care – PPO | Admitting: Ophthalmology

## 2013-04-30 ENCOUNTER — Encounter (INDEPENDENT_AMBULATORY_CARE_PROVIDER_SITE_OTHER): Payer: BC Managed Care – PPO | Admitting: Ophthalmology

## 2013-05-20 ENCOUNTER — Encounter (INDEPENDENT_AMBULATORY_CARE_PROVIDER_SITE_OTHER): Payer: BC Managed Care – PPO | Admitting: Ophthalmology

## 2013-05-20 DIAGNOSIS — E11311 Type 2 diabetes mellitus with unspecified diabetic retinopathy with macular edema: Secondary | ICD-10-CM

## 2013-05-20 DIAGNOSIS — E1165 Type 2 diabetes mellitus with hyperglycemia: Secondary | ICD-10-CM

## 2013-05-20 DIAGNOSIS — H43819 Vitreous degeneration, unspecified eye: Secondary | ICD-10-CM

## 2013-05-20 DIAGNOSIS — I10 Essential (primary) hypertension: Secondary | ICD-10-CM

## 2013-05-20 DIAGNOSIS — H2510 Age-related nuclear cataract, unspecified eye: Secondary | ICD-10-CM

## 2013-05-20 DIAGNOSIS — E11359 Type 2 diabetes mellitus with proliferative diabetic retinopathy without macular edema: Secondary | ICD-10-CM

## 2013-05-20 DIAGNOSIS — E1139 Type 2 diabetes mellitus with other diabetic ophthalmic complication: Secondary | ICD-10-CM

## 2013-05-20 DIAGNOSIS — H35039 Hypertensive retinopathy, unspecified eye: Secondary | ICD-10-CM

## 2013-06-16 ENCOUNTER — Encounter (INDEPENDENT_AMBULATORY_CARE_PROVIDER_SITE_OTHER): Payer: BC Managed Care – PPO | Admitting: Ophthalmology

## 2013-07-17 ENCOUNTER — Encounter (INDEPENDENT_AMBULATORY_CARE_PROVIDER_SITE_OTHER): Payer: BC Managed Care – PPO | Admitting: Ophthalmology

## 2013-07-17 DIAGNOSIS — H4310 Vitreous hemorrhage, unspecified eye: Secondary | ICD-10-CM

## 2013-07-17 DIAGNOSIS — E11311 Type 2 diabetes mellitus with unspecified diabetic retinopathy with macular edema: Secondary | ICD-10-CM

## 2013-07-17 DIAGNOSIS — E1165 Type 2 diabetes mellitus with hyperglycemia: Secondary | ICD-10-CM

## 2013-07-17 DIAGNOSIS — E1139 Type 2 diabetes mellitus with other diabetic ophthalmic complication: Secondary | ICD-10-CM

## 2013-07-17 DIAGNOSIS — E11359 Type 2 diabetes mellitus with proliferative diabetic retinopathy without macular edema: Secondary | ICD-10-CM

## 2013-07-17 DIAGNOSIS — I10 Essential (primary) hypertension: Secondary | ICD-10-CM

## 2013-07-17 DIAGNOSIS — H35039 Hypertensive retinopathy, unspecified eye: Secondary | ICD-10-CM

## 2013-07-17 DIAGNOSIS — H43819 Vitreous degeneration, unspecified eye: Secondary | ICD-10-CM

## 2013-08-14 ENCOUNTER — Encounter (INDEPENDENT_AMBULATORY_CARE_PROVIDER_SITE_OTHER): Payer: BC Managed Care – PPO | Admitting: Ophthalmology

## 2013-08-14 DIAGNOSIS — E1139 Type 2 diabetes mellitus with other diabetic ophthalmic complication: Secondary | ICD-10-CM

## 2013-08-14 DIAGNOSIS — E11359 Type 2 diabetes mellitus with proliferative diabetic retinopathy without macular edema: Secondary | ICD-10-CM

## 2013-08-14 DIAGNOSIS — H4310 Vitreous hemorrhage, unspecified eye: Secondary | ICD-10-CM

## 2013-08-14 DIAGNOSIS — E1165 Type 2 diabetes mellitus with hyperglycemia: Secondary | ICD-10-CM

## 2013-08-14 DIAGNOSIS — H35039 Hypertensive retinopathy, unspecified eye: Secondary | ICD-10-CM

## 2013-08-14 DIAGNOSIS — I10 Essential (primary) hypertension: Secondary | ICD-10-CM

## 2013-08-14 DIAGNOSIS — E11311 Type 2 diabetes mellitus with unspecified diabetic retinopathy with macular edema: Secondary | ICD-10-CM

## 2013-09-11 ENCOUNTER — Encounter (INDEPENDENT_AMBULATORY_CARE_PROVIDER_SITE_OTHER): Payer: BC Managed Care – PPO | Admitting: Ophthalmology

## 2013-11-25 ENCOUNTER — Encounter (INDEPENDENT_AMBULATORY_CARE_PROVIDER_SITE_OTHER): Payer: BC Managed Care – PPO | Admitting: Ophthalmology

## 2013-12-07 ENCOUNTER — Encounter (INDEPENDENT_AMBULATORY_CARE_PROVIDER_SITE_OTHER): Payer: BC Managed Care – PPO | Admitting: Ophthalmology

## 2013-12-07 DIAGNOSIS — E11351 Type 2 diabetes mellitus with proliferative diabetic retinopathy with macular edema: Secondary | ICD-10-CM

## 2013-12-07 DIAGNOSIS — E11311 Type 2 diabetes mellitus with unspecified diabetic retinopathy with macular edema: Secondary | ICD-10-CM

## 2013-12-07 DIAGNOSIS — I10 Essential (primary) hypertension: Secondary | ICD-10-CM

## 2013-12-07 DIAGNOSIS — H35033 Hypertensive retinopathy, bilateral: Secondary | ICD-10-CM

## 2013-12-07 DIAGNOSIS — H43813 Vitreous degeneration, bilateral: Secondary | ICD-10-CM

## 2013-12-07 DIAGNOSIS — H2511 Age-related nuclear cataract, right eye: Secondary | ICD-10-CM

## 2014-01-04 ENCOUNTER — Encounter (INDEPENDENT_AMBULATORY_CARE_PROVIDER_SITE_OTHER): Payer: BC Managed Care – PPO | Admitting: Ophthalmology

## 2014-01-04 DIAGNOSIS — E11351 Type 2 diabetes mellitus with proliferative diabetic retinopathy with macular edema: Secondary | ICD-10-CM

## 2014-01-04 DIAGNOSIS — E11311 Type 2 diabetes mellitus with unspecified diabetic retinopathy with macular edema: Secondary | ICD-10-CM

## 2014-01-04 DIAGNOSIS — I10 Essential (primary) hypertension: Secondary | ICD-10-CM

## 2014-01-04 DIAGNOSIS — H43813 Vitreous degeneration, bilateral: Secondary | ICD-10-CM

## 2014-01-04 DIAGNOSIS — H35033 Hypertensive retinopathy, bilateral: Secondary | ICD-10-CM

## 2014-02-08 ENCOUNTER — Encounter (INDEPENDENT_AMBULATORY_CARE_PROVIDER_SITE_OTHER): Payer: BC Managed Care – PPO | Admitting: Ophthalmology

## 2014-02-08 DIAGNOSIS — E11351 Type 2 diabetes mellitus with proliferative diabetic retinopathy with macular edema: Secondary | ICD-10-CM

## 2014-02-08 DIAGNOSIS — H35033 Hypertensive retinopathy, bilateral: Secondary | ICD-10-CM

## 2014-02-08 DIAGNOSIS — H43813 Vitreous degeneration, bilateral: Secondary | ICD-10-CM

## 2014-02-08 DIAGNOSIS — E11311 Type 2 diabetes mellitus with unspecified diabetic retinopathy with macular edema: Secondary | ICD-10-CM

## 2014-02-08 DIAGNOSIS — I10 Essential (primary) hypertension: Secondary | ICD-10-CM

## 2014-02-08 DIAGNOSIS — H2513 Age-related nuclear cataract, bilateral: Secondary | ICD-10-CM

## 2014-02-08 DIAGNOSIS — E11359 Type 2 diabetes mellitus with proliferative diabetic retinopathy without macular edema: Secondary | ICD-10-CM

## 2014-03-12 ENCOUNTER — Encounter (INDEPENDENT_AMBULATORY_CARE_PROVIDER_SITE_OTHER): Payer: BC Managed Care – PPO | Admitting: Ophthalmology

## 2015-12-07 DIAGNOSIS — N419 Inflammatory disease of prostate, unspecified: Secondary | ICD-10-CM

## 2015-12-07 HISTORY — DX: Inflammatory disease of prostate, unspecified: N41.9

## 2015-12-22 ENCOUNTER — Encounter (HOSPITAL_COMMUNITY)
Admission: EM | Disposition: A | Discharge status: Home / Self Care | Payer: Self-pay | Source: Home / Self Care | Attending: Cardiovascular Disease

## 2015-12-22 ENCOUNTER — Encounter (HOSPITAL_COMMUNITY): Payer: Self-pay | Admitting: *Deleted

## 2015-12-22 ENCOUNTER — Inpatient Hospital Stay (HOSPITAL_COMMUNITY)
Admission: EM | Admit: 2015-12-22 | Discharge: 2015-12-26 | DRG: 247 | Disposition: A | Discharge status: Home / Self Care | Payer: BC Managed Care – PPO | Attending: Cardiovascular Disease | Admitting: Cardiovascular Disease

## 2015-12-22 ENCOUNTER — Other Ambulatory Visit: Payer: Self-pay

## 2015-12-22 ENCOUNTER — Emergency Department (HOSPITAL_COMMUNITY): Payer: BC Managed Care – PPO

## 2015-12-22 DIAGNOSIS — Z7984 Long term (current) use of oral hypoglycemic drugs: Secondary | ICD-10-CM | POA: Diagnosis not present

## 2015-12-22 DIAGNOSIS — N179 Acute kidney failure, unspecified: Secondary | ICD-10-CM | POA: Diagnosis not present

## 2015-12-22 DIAGNOSIS — Z8249 Family history of ischemic heart disease and other diseases of the circulatory system: Secondary | ICD-10-CM | POA: Diagnosis not present

## 2015-12-22 DIAGNOSIS — F17210 Nicotine dependence, cigarettes, uncomplicated: Secondary | ICD-10-CM | POA: Diagnosis not present

## 2015-12-22 DIAGNOSIS — Z823 Family history of stroke: Secondary | ICD-10-CM

## 2015-12-22 DIAGNOSIS — G44209 Tension-type headache, unspecified, not intractable: Secondary | ICD-10-CM | POA: Diagnosis not present

## 2015-12-22 DIAGNOSIS — Z79899 Other long term (current) drug therapy: Secondary | ICD-10-CM | POA: Diagnosis not present

## 2015-12-22 DIAGNOSIS — Z006 Encounter for examination for normal comparison and control in clinical research program: Secondary | ICD-10-CM | POA: Diagnosis not present

## 2015-12-22 DIAGNOSIS — E1121 Type 2 diabetes mellitus with diabetic nephropathy: Secondary | ICD-10-CM

## 2015-12-22 DIAGNOSIS — Z955 Presence of coronary angioplasty implant and graft: Secondary | ICD-10-CM

## 2015-12-22 DIAGNOSIS — I129 Hypertensive chronic kidney disease with stage 1 through stage 4 chronic kidney disease, or unspecified chronic kidney disease: Secondary | ICD-10-CM | POA: Diagnosis present

## 2015-12-22 DIAGNOSIS — E1122 Type 2 diabetes mellitus with diabetic chronic kidney disease: Secondary | ICD-10-CM | POA: Diagnosis not present

## 2015-12-22 DIAGNOSIS — I2510 Atherosclerotic heart disease of native coronary artery without angina pectoris: Secondary | ICD-10-CM | POA: Diagnosis not present

## 2015-12-22 DIAGNOSIS — I255 Ischemic cardiomyopathy: Secondary | ICD-10-CM | POA: Diagnosis not present

## 2015-12-22 DIAGNOSIS — I25110 Atherosclerotic heart disease of native coronary artery with unstable angina pectoris: Secondary | ICD-10-CM

## 2015-12-22 DIAGNOSIS — Z720 Tobacco use: Secondary | ICD-10-CM | POA: Diagnosis present

## 2015-12-22 DIAGNOSIS — I10 Essential (primary) hypertension: Secondary | ICD-10-CM | POA: Diagnosis present

## 2015-12-22 DIAGNOSIS — I214 Non-ST elevation (NSTEMI) myocardial infarction: Secondary | ICD-10-CM | POA: Diagnosis not present

## 2015-12-22 DIAGNOSIS — E7800 Pure hypercholesterolemia, unspecified: Secondary | ICD-10-CM | POA: Diagnosis not present

## 2015-12-22 DIAGNOSIS — N183 Chronic kidney disease, stage 3 unspecified: Secondary | ICD-10-CM

## 2015-12-22 DIAGNOSIS — N189 Chronic kidney disease, unspecified: Secondary | ICD-10-CM | POA: Diagnosis not present

## 2015-12-22 DIAGNOSIS — I213 ST elevation (STEMI) myocardial infarction of unspecified site: Secondary | ICD-10-CM | POA: Diagnosis not present

## 2015-12-22 HISTORY — DX: Atherosclerotic heart disease of native coronary artery without angina pectoris: I25.10

## 2015-12-22 HISTORY — DX: Inflammatory disease of prostate, unspecified: N41.9

## 2015-12-22 HISTORY — DX: Type 2 diabetes mellitus without complications: E11.9

## 2015-12-22 HISTORY — DX: Non-ST elevation (NSTEMI) myocardial infarction: I21.4

## 2015-12-22 HISTORY — DX: Tobacco use: Z72.0

## 2015-12-22 HISTORY — PX: CARDIAC CATHETERIZATION: SHX172

## 2015-12-22 HISTORY — DX: Depression, unspecified: F32.A

## 2015-12-22 HISTORY — PX: CORONARY ANGIOPLASTY WITH STENT PLACEMENT: SHX49

## 2015-12-22 HISTORY — DX: Major depressive disorder, single episode, unspecified: F32.9

## 2015-12-22 LAB — COMPREHENSIVE METABOLIC PANEL
ALBUMIN: 2.9 g/dL — AB (ref 3.5–5.0)
ALT: 15 U/L — ABNORMAL LOW (ref 17–63)
AST: 28 U/L (ref 15–41)
Alkaline Phosphatase: 81 U/L (ref 38–126)
Anion gap: 8 (ref 5–15)
BUN: 19 mg/dL (ref 6–20)
CHLORIDE: 102 mmol/L (ref 101–111)
CO2: 25 mmol/L (ref 22–32)
Calcium: 9 mg/dL (ref 8.9–10.3)
Creatinine, Ser: 1.59 mg/dL — ABNORMAL HIGH (ref 0.61–1.24)
GFR calc Af Amer: 57 mL/min — ABNORMAL LOW (ref >60)
GFR calc non Af Amer: 49 mL/min — ABNORMAL LOW (ref >60)
GLUCOSE: 149 mg/dL — AB (ref 65–99)
Potassium: 4.6 mmol/L (ref 3.5–5.1)
SODIUM: 135 mmol/L (ref 135–145)
Total Bilirubin: 0.5 mg/dL (ref 0.3–1.2)
Total Protein: 6.5 g/dL (ref 6.5–8.1)

## 2015-12-22 LAB — CBC WITH DIFFERENTIAL/PLATELET
Basophils Absolute: 0 10*3/uL (ref 0.0–0.1)
Basophils Relative: 0 %
EOS ABS: 0.1 10*3/uL (ref 0.0–0.7)
EOS PCT: 1 %
HCT: 40.6 % (ref 39.0–52.0)
HEMOGLOBIN: 13.7 g/dL (ref 13.0–17.0)
Lymphocytes Relative: 23 %
Lymphs Abs: 1.6 10*3/uL (ref 0.7–4.0)
MCH: 30.9 pg (ref 26.0–34.0)
MCHC: 33.7 g/dL (ref 30.0–36.0)
MCV: 91.4 fL (ref 78.0–100.0)
MONOS PCT: 8 %
Monocytes Absolute: 0.6 10*3/uL (ref 0.1–1.0)
Neutro Abs: 4.6 10*3/uL (ref 1.7–7.7)
Neutrophils Relative %: 68 %
PLATELETS: 225 10*3/uL (ref 150–400)
RBC: 4.44 MIL/uL (ref 4.22–5.81)
RDW: 13.1 % (ref 11.5–15.5)
WBC: 6.9 10*3/uL (ref 4.0–10.5)

## 2015-12-22 LAB — GLUCOSE, CAPILLARY
GLUCOSE-CAPILLARY: 95 mg/dL (ref 65–99)
GLUCOSE-CAPILLARY: 71 mg/dL (ref 65–99)
Glucose-Capillary: 312 mg/dL — ABNORMAL HIGH (ref 65–99)

## 2015-12-22 LAB — POCT ACTIVATED CLOTTING TIME
ACTIVATED CLOTTING TIME: 324 s
Activated Clotting Time: 312 seconds

## 2015-12-22 LAB — PROTIME-INR
INR: 1.02
PROTHROMBIN TIME: 13.4 s (ref 11.4–15.2)

## 2015-12-22 LAB — I-STAT TROPONIN, ED: Troponin i, poc: 5.8 ng/mL (ref 0.00–0.08)

## 2015-12-22 LAB — TROPONIN I
TROPONIN I: 7.37 ng/mL — AB (ref <0.03)
Troponin I: 7.1 ng/mL (ref <0.03)

## 2015-12-22 LAB — TSH: TSH: 0.92 u[IU]/mL (ref 0.350–4.500)

## 2015-12-22 SURGERY — LEFT HEART CATH AND CORONARY ANGIOGRAPHY
Anesthesia: LOCAL

## 2015-12-22 MED ORDER — SODIUM CHLORIDE 0.9 % IV SOLN: 250.0000 mL | INTRAVENOUS | Status: DC | PRN

## 2015-12-22 MED ORDER — HYDRALAZINE HCL 20 MG/ML IJ SOLN: 10.0000 mg | Freq: Four times a day (QID) | INTRAMUSCULAR | Status: DC | PRN

## 2015-12-22 MED ORDER — LISINOPRIL 5 MG PO TABS: 5.0000 mg | ORAL_TABLET | Freq: Every day | ORAL | Status: DC

## 2015-12-22 MED ORDER — SODIUM CHLORIDE 0.9% FLUSH: 3.0000 mL | INTRAVENOUS | Status: DC | PRN

## 2015-12-22 MED ORDER — LIDOCAINE HCL (PF) 1 % IJ SOLN
INTRAMUSCULAR | Status: DC | PRN
  Administered 2015-12-22: 2 mL

## 2015-12-22 MED ORDER — VERAPAMIL HCL 2.5 MG/ML IV SOLN
INTRAVENOUS | Status: AC
  Filled 2015-12-22: qty 2

## 2015-12-22 MED ORDER — IOPAMIDOL (ISOVUE-370) INJECTION 76%
INTRAVENOUS | Status: AC
  Filled 2015-12-22: qty 100

## 2015-12-22 MED ORDER — MIDAZOLAM HCL 2 MG/2ML IJ SOLN
INTRAMUSCULAR | Status: AC
  Filled 2015-12-22: qty 2

## 2015-12-22 MED ORDER — ASPIRIN 81 MG PO CHEW: 324.0000 mg | CHEWABLE_TABLET | ORAL | Status: DC

## 2015-12-22 MED ORDER — HEPARIN (PORCINE) IN NACL 2-0.9 UNIT/ML-% IJ SOLN
INTRAMUSCULAR | Status: AC
  Filled 2015-12-22: qty 1000

## 2015-12-22 MED ORDER — IOPAMIDOL (ISOVUE-370) INJECTION 76%
INTRAVENOUS | Status: DC | PRN
  Administered 2015-12-22: 160 mL via INTRA_ARTERIAL

## 2015-12-22 MED ORDER — TICAGRELOR 90 MG PO TABS
ORAL_TABLET | ORAL | Status: DC | PRN
  Administered 2015-12-22: 180 mg via ORAL

## 2015-12-22 MED ORDER — NITROGLYCERIN 1 MG/10 ML FOR IR/CATH LAB
INTRA_ARTERIAL | Status: DC | PRN
  Administered 2015-12-22 (×2): 200 ug via INTRACORONARY

## 2015-12-22 MED ORDER — SODIUM CHLORIDE 0.9% FLUSH: 3.0000 mL | Freq: Two times a day (BID) | INTRAVENOUS | Status: DC

## 2015-12-22 MED ORDER — ATORVASTATIN CALCIUM 80 MG PO TABS
80.0000 mg | ORAL_TABLET | Freq: Every day | ORAL | Status: DC
  Administered 2015-12-22 – 2015-12-25 (×4): 80 mg via ORAL
  Filled 2015-12-22 (×4): qty 1

## 2015-12-22 MED ORDER — SODIUM CHLORIDE 0.9 % WEIGHT BASED INFUSION: 1.0000 mL/kg/h | INTRAVENOUS | Status: DC

## 2015-12-22 MED ORDER — ANGIOPLASTY BOOK
Freq: Once | Status: AC
  Administered 2015-12-22: 21:00:00
  Filled 2015-12-22: qty 1

## 2015-12-22 MED ORDER — ASPIRIN 300 MG RE SUPP: 300.0000 mg | RECTAL | Status: DC

## 2015-12-22 MED ORDER — LIDOCAINE HCL (PF) 1 % IJ SOLN
INTRAMUSCULAR | Status: AC
Start: 2015-12-22 — End: 2015-12-22
  Filled 2015-12-22: qty 30

## 2015-12-22 MED ORDER — NITROGLYCERIN 0.2 MG/ML ON CALL CATH LAB
INTRAVENOUS | Status: AC
  Filled 2015-12-22: qty 1

## 2015-12-22 MED ORDER — SODIUM CHLORIDE 0.9 % IV SOLN
Freq: Once | INTRAVENOUS | Status: AC
  Administered 2015-12-22: 10:00:00 via INTRAVENOUS

## 2015-12-22 MED ORDER — SODIUM CHLORIDE 0.9 % WEIGHT BASED INFUSION
3.0000 mL/kg/h | INTRAVENOUS | Status: DC
  Administered 2015-12-22: 3 mL/kg/h via INTRAVENOUS

## 2015-12-22 MED ORDER — SODIUM CHLORIDE 0.9% FLUSH
3.0000 mL | Freq: Two times a day (BID) | INTRAVENOUS | Status: DC
  Administered 2015-12-23 – 2015-12-26 (×6): 3 mL via INTRAVENOUS

## 2015-12-22 MED ORDER — VERAPAMIL HCL 2.5 MG/ML IV SOLN
INTRAVENOUS | Status: DC | PRN
  Administered 2015-12-22: 10 mL via INTRA_ARTERIAL

## 2015-12-22 MED ORDER — HEART ATTACK BOUNCING BOOK
Freq: Once | Status: AC
  Administered 2015-12-22: 21:00:00
  Filled 2015-12-22: qty 1

## 2015-12-22 MED ORDER — ACTIVE PARTNERSHIP FOR HEALTH OF YOUR HEART BOOK
Freq: Once | Status: AC
  Administered 2015-12-22: 23:00:00
  Filled 2015-12-22: qty 1

## 2015-12-22 MED ORDER — ACETAMINOPHEN 325 MG PO TABS: 650.0000 mg | ORAL_TABLET | ORAL | Status: DC | PRN

## 2015-12-22 MED ORDER — ONDANSETRON HCL 4 MG/2ML IJ SOLN: 4.0000 mg | Freq: Four times a day (QID) | INTRAMUSCULAR | Status: DC | PRN

## 2015-12-22 MED ORDER — ASPIRIN EC 81 MG PO TBEC
81.0000 mg | DELAYED_RELEASE_TABLET | Freq: Every day | ORAL | Status: DC
  Administered 2015-12-23 – 2015-12-26 (×4): 81 mg via ORAL
  Filled 2015-12-22 (×4): qty 1

## 2015-12-22 MED ORDER — HEPARIN (PORCINE) IN NACL 2-0.9 UNIT/ML-% IJ SOLN
INTRAMUSCULAR | Status: DC | PRN
  Administered 2015-12-22: 1000 mL

## 2015-12-22 MED ORDER — FENTANYL CITRATE (PF) 100 MCG/2ML IJ SOLN
INTRAMUSCULAR | Status: DC | PRN
  Administered 2015-12-22: 50 ug via INTRAVENOUS
  Administered 2015-12-22: 25 ug via INTRAVENOUS

## 2015-12-22 MED ORDER — HEPARIN BOLUS VIA INFUSION
3600.0000 [IU] | Freq: Once | INTRAVENOUS | Status: AC
  Administered 2015-12-22: 3600 [IU] via INTRAVENOUS
  Filled 2015-12-22: qty 3600

## 2015-12-22 MED ORDER — TICAGRELOR 90 MG PO TABS
90.0000 mg | ORAL_TABLET | Freq: Two times a day (BID) | ORAL | Status: DC
  Administered 2015-12-23 – 2015-12-26 (×8): 90 mg via ORAL
  Filled 2015-12-22 (×8): qty 1

## 2015-12-22 MED ORDER — HEPARIN SODIUM (PORCINE) 5000 UNIT/ML IJ SOLN: 60.0000 [IU]/kg | Freq: Once | INTRAMUSCULAR | Status: DC

## 2015-12-22 MED ORDER — ASPIRIN 81 MG PO CHEW: 81.0000 mg | CHEWABLE_TABLET | ORAL | Status: AC

## 2015-12-22 MED ORDER — CARVEDILOL 12.5 MG PO TABS
12.5000 mg | ORAL_TABLET | Freq: Two times a day (BID) | ORAL | Status: DC
  Administered 2015-12-22: 17:00:00 12.5 mg via ORAL
  Filled 2015-12-22 (×2): qty 1

## 2015-12-22 MED ORDER — NITROGLYCERIN 0.4 MG SL SUBL: 0.4000 mg | SUBLINGUAL_TABLET | SUBLINGUAL | Status: DC | PRN

## 2015-12-22 MED ORDER — FENTANYL CITRATE (PF) 100 MCG/2ML IJ SOLN
INTRAMUSCULAR | Status: AC
  Filled 2015-12-22: qty 2

## 2015-12-22 MED ORDER — HEPARIN SODIUM (PORCINE) 1000 UNIT/ML IJ SOLN
INTRAMUSCULAR | Status: AC
  Filled 2015-12-22: qty 1

## 2015-12-22 MED ORDER — ALPRAZOLAM 0.5 MG PO TABS: 0.5000 mg | ORAL_TABLET | Freq: Two times a day (BID) | ORAL | Status: DC | PRN

## 2015-12-22 MED ORDER — HEPARIN (PORCINE) IN NACL 100-0.45 UNIT/ML-% IJ SOLN
750.0000 [IU]/h | INTRAMUSCULAR | Status: DC
  Administered 2015-12-22: 750 [IU]/h via INTRAVENOUS
  Filled 2015-12-22: qty 250

## 2015-12-22 MED ORDER — INSULIN ASPART 100 UNIT/ML ~~LOC~~ SOLN
0.0000 [IU] | Freq: Three times a day (TID) | SUBCUTANEOUS | Status: DC
  Administered 2015-12-22: 11 [IU] via SUBCUTANEOUS
  Administered 2015-12-23: 12:00:00 8 [IU] via SUBCUTANEOUS
  Administered 2015-12-23: 18:00:00 5 [IU] via SUBCUTANEOUS
  Administered 2015-12-24: 8 [IU] via SUBCUTANEOUS
  Administered 2015-12-24: 06:00:00 5 [IU] via SUBCUTANEOUS
  Administered 2015-12-24: 2 [IU] via SUBCUTANEOUS
  Administered 2015-12-25 (×2): 3 [IU] via SUBCUTANEOUS
  Administered 2015-12-25: 5 [IU] via SUBCUTANEOUS
  Administered 2015-12-26 (×2): 3 [IU] via SUBCUTANEOUS

## 2015-12-22 MED ORDER — MIDAZOLAM HCL 2 MG/2ML IJ SOLN
INTRAMUSCULAR | Status: DC | PRN
  Administered 2015-12-22: 2 mg via INTRAVENOUS
  Administered 2015-12-22: 1 mg via INTRAVENOUS

## 2015-12-22 MED ORDER — HEPARIN SODIUM (PORCINE) 1000 UNIT/ML IJ SOLN
INTRAMUSCULAR | Status: DC | PRN
  Administered 2015-12-22: 6000 [IU] via INTRAVENOUS
  Administered 2015-12-22: 4000 [IU] via INTRAVENOUS

## 2015-12-22 MED ORDER — SODIUM CHLORIDE 0.9 % IV SOLN
INTRAVENOUS | Status: AC
  Administered 2015-12-22: 15:00:00 via INTRAVENOUS

## 2015-12-22 MED ORDER — ASPIRIN 81 MG PO CHEW
324.0000 mg | CHEWABLE_TABLET | Freq: Once | ORAL | Status: AC
  Administered 2015-12-22: 324 mg via ORAL
  Filled 2015-12-22: qty 4

## 2015-12-22 MED ORDER — TICAGRELOR 90 MG PO TABS
ORAL_TABLET | ORAL | Status: AC
  Filled 2015-12-22: qty 2

## 2015-12-22 SURGICAL SUPPLY — 22 items
BALLN MOZEC 1.5X15 (BALLOONS) ×2
BALLN MOZEC 2.50X20 (BALLOONS) ×2
BALLN ~~LOC~~ MOZEC 3.0X23 (BALLOONS) ×2
BALLN ~~LOC~~ MOZEC 3.5X15 (BALLOONS) ×2
BALLOON MOZEC 1.5X15 (BALLOONS) ×1 IMPLANT
BALLOON MOZEC 2.50X20 (BALLOONS) ×1 IMPLANT
BALLOON ~~LOC~~ MOZEC 3.0X23 (BALLOONS) ×1 IMPLANT
BALLOON ~~LOC~~ MOZEC 3.5X15 (BALLOONS) ×1 IMPLANT
CATH 5FR JL3.5 JR4 ANG PIG MP (CATHETERS) ×2 IMPLANT
CATH VISTA GUIDE 6FR XB3.5 (CATHETERS) ×2 IMPLANT
DEVICE RAD COMP TR BAND LRG (VASCULAR PRODUCTS) ×2 IMPLANT
GLIDESHEATH SLEND SS 6F .021 (SHEATH) ×2 IMPLANT
GUIDEWIRE INQWIRE 1.5J.035X260 (WIRE) ×1 IMPLANT
INQWIRE 1.5J .035X260CM (WIRE) ×2
KIT ENCORE 26 ADVANTAGE (KITS) ×2 IMPLANT
KIT HEART LEFT (KITS) ×2 IMPLANT
PACK CARDIAC CATHETERIZATION (CUSTOM PROCEDURE TRAY) ×2 IMPLANT
STENT PROMUS PREM MR 2.75X28 (Permanent Stent) ×2 IMPLANT
STENT PROMUS PREM MR 3.5X20 (Permanent Stent) ×2 IMPLANT
TRANSDUCER W/STOPCOCK (MISCELLANEOUS) ×2 IMPLANT
TUBING CIL FLEX 10 FLL-RA (TUBING) ×2 IMPLANT
WIRE COUGAR XT STRL 190CM (WIRE) ×4 IMPLANT

## 2015-12-22 NOTE — ED Provider Notes (Signed)
[facility] DEPT Provider Note   CSN: [id number] Arrival date & time: 12/22/15  6834     History   Chief Complaint Chief Complaint  Patient presents with  . Headache  . Chest Pain    HPI Jeffery Young is a 50 y.o. male.  HPI   Jeffery Young is a 50 y.o. male, with a history of DM, hypercholesterolemia, and HTN, presenting to the ED with recurrent headaches and chest pain for the past two weeks. These episodes have been occurring in the evenings beginning around 7pm. Pt would take ibuprofen and then go to sleep. Endorses an episode of vomiting last night with onset of symptoms. Denies current pain.  Headache and CP occur simultaneously. Headache is in the back of the head, throbbing, radiates into the posterior neck. CP occurs at rest, retrosternal, described as a pressure, moderate to severe, radiates down the left arm. Endorses significantly increased stress over the last few weeks.    Risk factors include medical history and 1 PPD smoking. Last oral intake was around 9pm last night.   Past Medical History:  Diagnosis Date  . Diabetes mellitus without complication ([place])   . Hypercholesteremia   . Hypertension   . Tobacco abuse     Patient Active Problem List   Diagnosis Date Noted  . NSTEMI (non-ST elevated myocardial infarction) ([place]) 12/22/2015  . Lightheadedness 08/22/2012  . Dehydration 08/22/2012    History reviewed. No pertinent surgical history.     Home Medications    Prior to Admission medications   Medication Sig Start Date End Date Taking? Authorizing Provider  ibuprofen (ADVIL,MOTRIN) 200 MG tablet Take 200 mg by mouth every 6 (six) hours as needed for moderate pain.   Yes Historical Provider, MD  lisinopril (PRINIVIL,ZESTRIL) 5 MG tablet Take 5 mg by mouth daily.   Yes Historical Provider, MD  metFORMIN (GLUCOPHAGE) 1000 MG tablet Take 1,000 mg by mouth 2 (two) times daily with a meal.   Yes Historical Provider, MD  simvastatin (ZOCOR) 20 MG  tablet Take 20 mg by mouth every morning.    Yes Historical Provider, MD    Family History Family History  Problem Relation Age of Onset  . CAD Brother   . CVA Brother     Social History Social History  Substance Use Topics  . Smoking status: Current Every Day Smoker    Types: Cigarettes  . Smokeless tobacco: Never Used  . Alcohol use Yes     Allergies   Patient has no known allergies.   Review of Systems Review of Systems  Constitutional: Negative for chills and fever.  Respiratory: Negative for cough and shortness of breath.   Cardiovascular: Positive for chest pain (none currently).  Gastrointestinal: Positive for vomiting (resolved). Negative for abdominal pain and nausea.  Neurological: Positive for headaches. Negative for dizziness, syncope, weakness, light-headedness and numbness.  All other systems reviewed and are negative.    Physical Exam Updated Vital Signs BP (!) 162/104 (BP Location: Right Arm)   Pulse 86   Temp 97.9 F (36.6 C) (Oral)   Resp 18   Ht 5\' 5"  (1.651 m)   Wt 61.2 kg   SpO2 98%   BMI 22.47 kg/m   Physical Exam  Constitutional: He is oriented to person, place, and time. He appears well-developed and well-nourished. No distress.  HENT:  Head: Normocephalic and atraumatic.  Eyes: Conjunctivae and EOM are normal. Pupils are equal, round, and reactive to light.  Neck: Normal range of motion. Neck  supple.  Cardiovascular: Normal rate, regular rhythm, normal heart sounds and intact distal pulses.   Pulmonary/Chest: Effort normal and breath sounds normal. No respiratory distress. He exhibits no tenderness.  Abdominal: Soft. There is no tenderness. There is no guarding.  Musculoskeletal: He exhibits no edema.  Normal motor function intact in all extremities and spine. No midline spinal tenderness.   Lymphadenopathy:    He has no cervical adenopathy.  Neurological: He is alert and oriented to person, place, and time.  No sensory deficits.  Strength 5/5 in all extremities. No gait disturbance. Coordination intact. Cranial nerves III-XII grossly intact. No facial droop.   Skin: Skin is warm and dry. He is not diaphoretic.  Psychiatric: He has a normal mood and affect. His behavior is normal.  Nursing note and vitals reviewed.    ED Treatments / Results  Labs (all labs ordered are listed, but only abnormal results are displayed) Labs Reviewed  COMPREHENSIVE METABOLIC PANEL - Abnormal; Notable for the following:       Result Value   Glucose, Bld 149 (*)    Creatinine, Ser 1.59 (*)    Albumin 2.9 (*)    ALT 15 (*)    GFR calc non Af Amer 49 (*)    GFR calc Af Amer 57 (*)    All other components within normal limits  I-STAT TROPOININ, ED - Abnormal; Notable for the following:    Troponin i, poc 5.80 (*)    All other components within normal limits  CBC WITH DIFFERENTIAL/PLATELET  HEPARIN LEVEL (UNFRACTIONATED)    EKG  EKG Interpretation  Date/Time:  Thursday December 22 2015 06:46:59 EST Ventricular Rate:  89 PR Interval:  132 QRS Duration: 90 QT Interval:  354 QTC Calculation: 430 R Axis:   75 Text Interpretation:  Normal sinus rhythm Septal infarct , age undetermined ST & T wave abnormality, consider inferior ischemia that is new compared to prior Abnormal ECG Confirmed by WARD,  DO, KRISTEN [phone]) on 12/22/2015 6:52:14 AM        EKG Interpretation  Date/Time:  Thursday December 22 2015 08:37:46 EST Ventricular Rate:  79 PR Interval:  132 QRS Duration: 95 QT Interval:  391 QTC Calculation: 449 R Axis:   72 Text Interpretation:  Sinus rhythm Anteroseptal infarct, old Borderline repolarization abnormality flipped t wave in v5 resolved Otherwise no significant change Confirmed by Tyrone Nine MD, DANIEL [phone]) on 12/22/2015 9:28:29 AM       Radiology Dg Chest 2 View  Result Date: 12/22/2015 CLINICAL DATA:  Worsening chest pain radiating to left arm for past 2 weeks. Hypertension, diabetes, and  hypercholesterolemia. EXAM: CHEST  2 VIEW COMPARISON:  03/03/2010 FINDINGS: The heart size and mediastinal contours are within normal limits. Both lungs are clear. The visualized skeletal structures are unremarkable. IMPRESSION: Negative.  No active cardiopulmonary disease. Electronically Signed   By: Earle Gell M.D.   On: 12/22/2015 08:08    Procedures Procedures (including critical care time)  CRITICAL CARE Performed by: Joshia Kitchings C Gerene Nedd Total critical care time: 35 minutes Critical care time was exclusive of separately billable procedures and treating other patients. Critical care was necessary to treat or prevent imminent or life-threatening deterioration. Critical care was time spent personally by me on the following activities: development of treatment plan with patient and/or surrogate as well as nursing, discussions with consultants, evaluation of patient's response to treatment, examination of patient, obtaining history from patient or surrogate, ordering and performing treatments and interventions, ordering and review of laboratory  studies, ordering and review of radiographic studies, pulse oximetry and re-evaluation of patient's condition.  Medications Ordered in ED Medications  heparin ADULT infusion 100 units/mL (25000 units/263mL sodium chloride 0.45%) (750 Units/hr Intravenous New Bag/Given 12/22/15 0809)  insulin aspart (novoLOG) injection 0-15 Units (not administered)  aspirin chewable tablet 324 mg (324 mg Oral Given 12/22/15 0755)  heparin bolus via infusion 3,600 Units (3,600 Units Intravenous Bolus from Bag 12/22/15 0809)  0.9 %  sodium chloride infusion ( Intravenous New Bag/Given 12/22/15 0944)     Initial Impression / Assessment and Plan / ED Course  I have reviewed the triage vital signs and the nursing notes.  Pertinent labs & imaging results that were available during my care of the patient were reviewed by me and considered in my medical decision making (see chart  for details).  Clinical Course     Patient presents with recurrent chest pain over the last 2 weeks. Initial EKG has new ischemic changes since his last tracing in November 2014. Inverted T waves in inferior lateral leads. ST elevation in leads V1 through V3 does not meet STEMI criteria and are consistent with elevation noted in previous tracing.   Suspect ACS. HEART score of 8. History significant for Hypertension, hypercholesterolemia, DM, and smoking. Patient's twin brother had a MI last year. Cardiology consulted. Patient to go to cath lab.  Patient's headache is consistent with a tension-type headache. He has no current headache here in the ED. No focal neuro deficits. I do not think this patient has an intracranial hemorrhage.  Findings and plan of care discussed with Deno Etienne, DO. Dr. Tyrone Nine personally evaluated and examined this patient.  Vitals:   12/22/15 0649 12/22/15 0730 12/22/15 0830  BP: (!) 162/104 127/88 156/95  Pulse: 86 84 81  Resp: 18 17 17   Temp: 97.9 F (36.6 C)    TempSrc: Oral    SpO2: 98% 97% 97%  Weight: 61.2 kg    Height: 5\' 5"  (1.651 m)     Vitals:   12/22/15 0830 12/22/15 0930 12/22/15 1000 12/22/15 1050  BP: 156/95 134/93 137/96 145/93  Pulse: 81 94 93 83  Resp: 17 19 24 21   Temp:    98.3 F (36.8 C)  TempSrc:    Oral  SpO2: 97% 100% 96% 98%  Weight:      Height:         Final Clinical Impressions(s) / ED Diagnoses   Final diagnoses:  NSTEMI (non-ST elevated myocardial infarction) [facility])    New Prescriptions New Prescriptions   No medications on file     Lorayne Bender, PA-C 12/22/15 [place], DO 12/22/15 1117

## 2015-12-22 NOTE — ED Triage Notes (Signed)
Patient presents with c/o CP that occurs in the evening every night for the past 2 weeks and a headache that occurs at the same time

## 2015-12-22 NOTE — Interval H&P Note (Signed)
History and Physical Interval Note:  12/22/2015 1:14 PM  Jeffery Young  has presented today for surgery, with the diagnosis of postive enzymes  The various methods of treatment have been discussed with the patient and family. After consideration of risks, benefits and other options for treatment, the patient has consented to  Procedure(s): Left Heart Cath and Coronary Angiography (N/A) as a surgical intervention .  The patient's history has been reviewed, patient examined, no change in status, stable for surgery.  I have reviewed the patient's chart and labs.  Questions were answered to the patient's satisfaction.    Cath Lab Visit (complete for each Cath Lab visit)  Clinical Evaluation Leading to the Procedure:   ACS: Yes.    Non-ACS:    Anginal Classification: CCS IV  Anti-ischemic medical therapy: No Therapy  Non-Invasive Test Results: No non-invasive testing performed  Prior CABG: No previous CABG         Lauree Chandler

## 2015-12-22 NOTE — ED Notes (Signed)
MD at bedside. 

## 2015-12-22 NOTE — Progress Notes (Signed)
TR BAND REMOVAL  LOCATION:    right radial  DEFLATED PER PROTOCOL:    Yes.    TIME BAND OFF / DRESSING APPLIED:    20:30   SITE UPON ARRIVAL:    Level 0  SITE AFTER BAND REMOVAL:    Level 0  CIRCULATION SENSATION AND MOVEMENT:    Within Normal Limits   Yes.    COMMENTS:   Post TR band instructions given. Pt tolerated well.

## 2015-12-22 NOTE — Progress Notes (Signed)
CRITICAL VALUE ALERT  Critical value received:  Troponin I    7.1  Date of notification: 12/22/15  Time of notification:  5537  Critical value read back:Yes.    Nurse who received alert:  Dina Rich RN  MD notified (1st page):  Bernerd Pho PA  Time of first page:  [phone]   Responding MD: Bernerd Pho PA  Time MD responded: [phone]

## 2015-12-22 NOTE — Progress Notes (Signed)
ANTICOAGULATION CONSULT NOTE - Initial Consult  Pharmacy Consult for heparin Indication: chest pain/ACS  No Known Allergies  Patient Measurements: Height: 5\' 5"  (165.1 cm) Weight: 135 lb (61.2 kg) IBW/kg (Calculated) : 61.5 Heparin Dosing Weight: 61.2 KG  Vital Signs: Temp: 97.9 F (36.6 C) (11/16 0649) Temp Source: Oral (11/16 0649) BP: 127/88 (11/16 0730) Pulse Rate: 84 (11/16 0730)  Labs:  Recent Labs  12/22/15 0655  HGB 13.7  HCT 40.6  PLT 225  CREATININE 1.59*    Estimated Creatinine Clearance: 48.6 mL/min (by C-G formula based on SCr of 1.59 mg/dL (H)).   Medical History: Past Medical History:  Diagnosis Date  . Diabetes mellitus without complication ([place])   . Hypercholesteremia   . Hypertension     Medications:   (Not in a hospital admission)  Assessment: Patient presents with c/o CP for the past 2 weeks and associated headache. No anticoagulants PTA. Platelets and HgB stable WNL. Pharmacy has been asked to start heparin infusion.   Goal of Therapy:  Heparin level 0.3-0.7 units/ml Monitor platelets by anticoagulation protocol: Yes   Plan:  Give 3600 units bolus x 1 Start heparin infusion at 750 units/hr Check anti-Xa level in 6 hours and daily while on heparin Continue to monitor H&H and platelets  Jariah Jarmon L Tavaria Mackins 12/22/2015,7:45 AM

## 2015-12-22 NOTE — ED Notes (Signed)
Cardiology at bedside.

## 2015-12-22 NOTE — H&P (Signed)
Patient ID: Jeffery Young MRN: [id number], DOB/AGE: Aug 12, 1965   Admit date: 12/22/2015  Primary Physician: Tawanna Solo, MD Primary Cardiologist: Seen by Dr. Tamala Julian in 2012 Reason for admission: NSTEMI  Pt. Profile:  Jeffery Young is a 50 y.o. male with a history of hypertension, hyperlipidemia, diabetes type 2, ongoing tobacco abuse and strong family history of CAD (twin brother with MI) who presented to [facility] ED today with ongoing chest pain 2 weeks. POC troponin 5 and cardiology called for admission  He was seen by Dr. Tamala Julian in 2012 for evaluation of chest pain. He underwent a stress echo but was unable to achieve a target heart rate of 158 beats per minute. Exercise was stopped because of leg pain.  There was a question of EKG changes that were concerning but there were no wall motion abnormalities noted on stress echo.  Cardiac markers x4 were negative and he was chest pain free so he was discharge home.     He was in his usual state of health until 2 weeks ago when he started to notice chest pain associated with nausea/vomitting, diaphoresis and shortness of breath. It seemed to occur on his way home from work and when he was trying to go to sleep. Felt like there was bruising in his chest area with radiation into his neck. He would try taking Advil for some relief which did help. It is occurred always every night for 2 weeks and he finally decided that he needed to be evaluated. Not necessarily related to exertion. He is pretty active on the job and has not had chest pain there. No lower extremity edema, orthopnea or PND. No dizziness or syncope. He smokes almost a pack a day for many years.  His twin brother in [place] had a MI and stroke recently.  Problem List  Past Medical History:  Diagnosis Date  . Diabetes mellitus without complication ([place])   . Hypercholesteremia   . Hypertension   . Tobacco abuse     History reviewed. No pertinent surgical history.    Allergies  No Known Allergies   Home Medications  Prior to Admission medications   Medication Sig Start Date End Date Taking? Authorizing Provider  lisinopril (PRINIVIL,ZESTRIL) 5 MG tablet Take 5 mg by mouth daily.    Historical Provider, MD  metFORMIN (GLUCOPHAGE) 1000 MG tablet Take 1,000 mg by mouth 2 (two) times daily with a meal.    Historical Provider, MD  simvastatin (ZOCOR) 20 MG tablet Take 20 mg by mouth every morning.     Historical Provider, MD    Family History  Family History  Problem Relation Age of Onset  . CAD Brother   . CVA Brother    Family Status  Relation Status  . Brother Alive     Social History Social History   Social History  . Marital status: Married    Spouse name: N/A  . Number of children: N/A  . Years of education: N/A   Occupational History  . Not on file.   Social History Main Topics  . Smoking status: Current Every Day Smoker    Types: Cigarettes  . Smokeless tobacco: Never Used  . Alcohol use Yes  . Drug use: No  . Sexual activity: Not on file   Other Topics Concern  . Not on file   Social History Narrative  . No narrative on file     Review of Systems General:  No chills, fever, night sweats  or weight changes.  Cardiovascular:  +++ chest pain, +++ dyspnea on exertion, No LE edema, orthopnea, palpitations, paroxysmal nocturnal dyspnea. Dermatological: No rash, lesions/masses Respiratory: No cough, dyspnea Urologic: No hematuria, dysuria Abdominal:   +++ nausea, vomiting, NO diarrhea, bright red blood per rectum, melena, or hematemesis Neurologic:  No visual changes, wkns, changes in mental status.                            All other systems reviewed and are otherwise negative except as noted above.  Physical Exam  Blood pressure 156/95, pulse 81, temperature 97.9 F (36.6 C), temperature source Oral, resp. rate 17, height 5\' 5"  (1.651 m), weight 135 lb (61.2 kg), SpO2 97 %.  General: Pleasant, NAD Psych:  Normal affect. Neuro: Alert and oriented X 3. Moves all extremities spontaneously. HEENT: Normal  Neck: Supple without bruits or JVD. Lungs:  Resp regular and unlabored, CTA. Heart: RRR no s3, s4, or murmurs. Abdomen: Soft, non-tender, non-distended, BS + x 4.  Extremities: No clubbing, cyanosis or edema. DP/PT/Radials 2+ and equal bilaterally.  Labs  No results for input(s): CKTOTAL, CKMB, TROPONINI in the last 72 hours. Lab Results  Component Value Date   WBC 6.9 12/22/2015   HGB 13.7 12/22/2015   HCT 40.6 12/22/2015   MCV 91.4 12/22/2015   PLT 225 12/22/2015     Recent Labs Lab 12/22/15 0655  NA 135  K 4.6  CL 102  CO2 25  BUN 19  CREATININE 1.59*  CALCIUM 9.0  PROT 6.5  BILITOT 0.5  ALKPHOS 81  ALT 15*  AST 28  GLUCOSE 149*    No results found for: DDIMER   Radiology/Studies  Dg Chest 2 View  Result Date: 12/22/2015 CLINICAL DATA:  Worsening chest pain radiating to left arm for past 2 weeks. Hypertension, diabetes, and hypercholesterolemia. EXAM: CHEST  2 VIEW COMPARISON:  03/03/2010 FINDINGS: The heart size and mediastinal contours are within normal limits. Both lungs are clear. The visualized skeletal structures are unremarkable. IMPRESSION: Negative.  No active cardiopulmonary disease. Electronically Signed   By: Earle Gell M.D.   On: 12/22/2015 08:08    ECG  NSR HR 89 septal Q waves, inferolateral TWIs, peaked T waves in v2-v3 similar to previous  ASSESSMENT AND PLAN Jeffery Young is a 50 y.o. male with a history of hypertension, hyperlipidemia, diabetes type 2, ongoing tobacco abuse and strong family history of CAD (twin brother with MI) who presented to [facility] ED today with ongoing chest pain 2 weeks. POC troponin 5 and cardiology called for admission  NSTEMI: POC troponin 5.8. ECG with inferolateral TWIs. Continue IV heparin. Given 324 mg ASA. Will continue to cycle enzymes and plan for heart cath this afternoon. Keep NPO. -- Continue  ASA, statin and will add Coreg. Will change simvastatin to atorva 80mg  daily  I have reviewed the risks, indications, and alternatives to cardiac catheterization and possible angioplasty/stenting with the patient. Risks include but are not limited to bleeding, infection, vascular injury, stroke, myocardial infection, arrhythmia, kidney injury, radiation-related injury in the case of prolonged fluoroscopy use, emergency cardiac surgery, and death. The patient understands the risks of serious complication is low (<2%).   CKD: unclear baseline. Creat 1.59. Creat 1.03 in 2014. Will hold lisinopril 5mg  daily for cath.  DMT2: hold metformin for cath. Will start SSI  HTN: BP mildly elevated. Will add Coreg 12.5 mg BID and hold ACE given elevated creat  and need for cath  Tobacco abuse: counseled on cessation   Signed, Angelena Form, PA-C 12/22/2015, 8:46 AM  Pager [phone]  I have personally seen and examined this patient with Angelena Form, PA-C. I agree with the assessment and plan as outlined above. He presents with chest pain, elevated troponin  And EKG changes. He is a long term smoker with HTN, HLD and FH of CAD. My exam shows a thin male in NAD, CV:RRR, no murmurs. Lungs: clear bilaterally, Abd: soft,NT, Ext: no edema. Labs reviewed by me. EKG reviewed by me and shows inferior TWI, sinus.  Plan is to admit with IV heparin, ASA and perform cardiac cath later today with probable PCI. Risks and benefits of cath reviewed with pt. He agrees to proceed. He is pain free. If he has recurrent pain, will move up in the cath schedule. Will hydrate this am pre-cath with borderline renal function.   Lauree Chandler 12/22/2015 9:29 AM

## 2015-12-23 ENCOUNTER — Inpatient Hospital Stay (HOSPITAL_COMMUNITY): Payer: BC Managed Care – PPO

## 2015-12-23 ENCOUNTER — Encounter (HOSPITAL_COMMUNITY): Payer: Self-pay | Admitting: Cardiovascular Disease

## 2015-12-23 DIAGNOSIS — Z720 Tobacco use: Secondary | ICD-10-CM | POA: Diagnosis present

## 2015-12-23 DIAGNOSIS — E1121 Type 2 diabetes mellitus with diabetic nephropathy: Secondary | ICD-10-CM

## 2015-12-23 DIAGNOSIS — I10 Essential (primary) hypertension: Secondary | ICD-10-CM | POA: Diagnosis present

## 2015-12-23 DIAGNOSIS — I213 ST elevation (STEMI) myocardial infarction of unspecified site: Secondary | ICD-10-CM

## 2015-12-23 DIAGNOSIS — E7800 Pure hypercholesterolemia, unspecified: Secondary | ICD-10-CM | POA: Diagnosis present

## 2015-12-23 LAB — COMPREHENSIVE METABOLIC PANEL
ALBUMIN: 2.5 g/dL — AB (ref 3.5–5.0)
ALK PHOS: 72 U/L (ref 38–126)
ALT: 12 U/L — ABNORMAL LOW (ref 17–63)
ANION GAP: 7 (ref 5–15)
AST: 24 U/L (ref 15–41)
BUN: 24 mg/dL — ABNORMAL HIGH (ref 6–20)
CALCIUM: 8.6 mg/dL — AB (ref 8.9–10.3)
CO2: 25 mmol/L (ref 22–32)
Chloride: 104 mmol/L (ref 101–111)
Creatinine, Ser: 1.67 mg/dL — ABNORMAL HIGH (ref 0.61–1.24)
GFR calc non Af Amer: 47 mL/min — ABNORMAL LOW (ref >60)
GFR, EST AFRICAN AMERICAN: 54 mL/min — AB (ref >60)
GLUCOSE: 100 mg/dL — AB (ref 65–99)
Potassium: 4.3 mmol/L (ref 3.5–5.1)
SODIUM: 136 mmol/L (ref 135–145)
TOTAL PROTEIN: 5.5 g/dL — AB (ref 6.5–8.1)
Total Bilirubin: 0.6 mg/dL (ref 0.3–1.2)

## 2015-12-23 LAB — ECHOCARDIOGRAM COMPLETE
AOASC: 34 cm
CHL CUP MV DEC (S): 239
EERAT: 9.77
EWDT: 239 ms
FS: 12 % — AB (ref 28–44)
Height: 65 in
IVS/LV PW RATIO, ED: 0.96
LA diam end sys: 29 mm
LA vol: 39 mL
LADIAMINDEX: 1.73 cm/m2
LASIZE: 29 mm
LAVOLA4C: 32.3 mL
LAVOLIN: 23.2 mL/m2
LV TDI E'LATERAL: 7.4
LVEEAVG: 9.77
LVEEMED: 9.77
LVELAT: 7.4 cm/s
LVOT SV: 54 mL
LVOT VTI: 15.5 cm
LVOT area: 3.46 cm2
LVOT diameter: 21 mm
LVOT peak vel: 85 cm/s
Lateral S' vel: 10.3 cm/s
MV pk A vel: 87.3 m/s
MVPG: 2 mmHg
MVPKEVEL: 72.3 m/s
PW: 11 mm — AB (ref 0.6–1.1)
RV TAPSE: 22 mm
TDI e' medial: 5.55
Weight: 2179.91 oz

## 2015-12-23 LAB — CBC
HCT: 36 % — ABNORMAL LOW (ref 39.0–52.0)
Hemoglobin: 12 g/dL — ABNORMAL LOW (ref 13.0–17.0)
MCH: 30.5 pg (ref 26.0–34.0)
MCHC: 33.3 g/dL (ref 30.0–36.0)
MCV: 91.4 fL (ref 78.0–100.0)
Platelets: 219 10*3/uL (ref 150–400)
RBC: 3.94 MIL/uL — ABNORMAL LOW (ref 4.22–5.81)
RDW: 13.1 % (ref 11.5–15.5)
WBC: 8.8 10*3/uL (ref 4.0–10.5)

## 2015-12-23 LAB — PROTIME-INR
INR: 0.92
Prothrombin Time: 12.3 seconds (ref 11.4–15.2)

## 2015-12-23 LAB — LIPID PANEL
CHOL/HDL RATIO: 3.9 ratio
CHOLESTEROL: 218 mg/dL — AB (ref 0–200)
HDL: 56 mg/dL (ref >40)
LDL Cholesterol: 144 mg/dL — ABNORMAL HIGH (ref 0–99)
TRIGLYCERIDES: 92 mg/dL (ref <150)
VLDL: 18 mg/dL (ref 0–40)

## 2015-12-23 LAB — HEMOGLOBIN A1C
Hgb A1c MFr Bld: 6.9 % — ABNORMAL HIGH (ref 4.8–5.6)
MEAN PLASMA GLUCOSE: 151 mg/dL

## 2015-12-23 LAB — GLUCOSE, CAPILLARY
GLUCOSE-CAPILLARY: 241 mg/dL — AB (ref 65–99)
GLUCOSE-CAPILLARY: 214 mg/dL — AB (ref 65–99)
Glucose-Capillary: 104 mg/dL — ABNORMAL HIGH (ref 65–99)
Glucose-Capillary: 253 mg/dL — ABNORMAL HIGH (ref 65–99)

## 2015-12-23 LAB — TROPONIN I: TROPONIN I: 6.33 ng/mL — AB (ref <0.03)

## 2015-12-23 MED ORDER — CARVEDILOL 25 MG PO TABS
25.0000 mg | ORAL_TABLET | Freq: Two times a day (BID) | ORAL | Status: DC
  Administered 2015-12-23 – 2015-12-26 (×7): 25 mg via ORAL
  Filled 2015-12-23 (×2): qty 2
  Filled 2015-12-23 (×3): qty 1
  Filled 2015-12-23: qty 2
  Filled 2015-12-23: qty 1

## 2015-12-23 MED ORDER — SODIUM CHLORIDE 0.9 % IV SOLN
INTRAVENOUS | Status: AC
  Administered 2015-12-23: 10:00:00 via INTRAVENOUS

## 2015-12-23 NOTE — Progress Notes (Signed)
CARDIAC REHAB PHASE I   PRE:  Rate/Rhythm: 91 SR    BP: sitting 151/89    SaO2:   MODE:  Ambulation: 500 ft   POST:  Rate/Rhythm: 99 SR    BP: sitting 153/81     SaO2:   Tolerated well, denied CP. Does st his left great toe is hurting to walk on. BP fairly stable. Ed completed with pt. He is contemplating smoking cessation. Voices numerous times "what am I going to do if I quit smoking?" Gave fake cigarette, resources, and tips. Understands the importance of Brilinta and sts he takes his meds. Will send referral to [facility]. He can walk independently today. [place], ACSM 12/23/2015 9:32 AM

## 2015-12-23 NOTE — Progress Notes (Signed)
  2D Echocardiogram has been performed.  Jeffery Young 12/23/2015, 11:18 AM

## 2015-12-23 NOTE — Care Management Note (Signed)
Case Management Note  Patient Details  Name: Jeffery Young MRN: [id number] Date of Birth: 1965/02/24  Subjective/Objective:       CV intervention, pta indep,  Will be on brilinta, NCM gave him the 30 day savings card, informed him co pay is 30.00, he said he was thinking about participating in twilight but was not sure.   [place] [facility] .  NCM will cont to follow for dc need.             Action/Plan:   Expected Discharge Date:                  Expected Discharge Plan:  Home/Self Care  In-House Referral:     Discharge planning Services  CM Consult  Post Acute Care Choice:    Choice offered to:     DME Arranged:    DME Agency:     HH Arranged:    HH Agency:     Status of Service:  Completed, signed off  If discussed at [employer] of Stay Meetings, dates discussed:    Additional Comments:  Zenon Mayo, RN 12/23/2015, 11:47 AM

## 2015-12-23 NOTE — Progress Notes (Signed)
Patient Name: Jeffery Young Date of Encounter: 12/23/2015  Primary Cardiologist: Dr. Shon Millet Problem List     Principal Problem:   NSTEMI (non-ST elevated myocardial infarction) [facility]) Active Problems:   Type II diabetes mellitus (HCC)   Tobacco abuse   Hypertension   Hypercholesteremia     Subjective   No chest pain or SOB.   Inpatient Medications    Scheduled Meds: . aspirin EC  81 mg Oral Daily  . atorvastatin  80 mg Oral q1800  . carvedilol  25 mg Oral BID WC  . insulin aspart  0-15 Units Subcutaneous TID WC  . sodium chloride flush  3 mL Intravenous Q12H  . ticagrelor  90 mg Oral BID   Continuous Infusions:  PRN Meds: sodium chloride, acetaminophen, ALPRAZolam, hydrALAZINE, nitroGLYCERIN, ondansetron (ZOFRAN) IV, sodium chloride flush   Vital Signs    Vitals:   12/22/15 2200 12/23/15 0342 12/23/15 0618 12/23/15 0741  BP: (!) 145/90 (!) 172/93 (!) 166/94 (!) 172/94  Pulse: 88 89  89  Resp: (!) 33 (!) 28 (!) 27 19  Temp:  97 F (36.1 C)  97.7 F (36.5 C)  TempSrc:  Oral  Oral  SpO2: 96% 96% 96% 96%  Weight:  136 lb 3.9 oz (61.8 kg)    Height:        Intake/Output Summary (Last 24 hours) at 12/23/15 0805 Last data filed at 12/23/15 0343  Gross per 24 hour  Intake           955.63 ml  Output             1650 ml  Net          -694.37 ml   Filed Weights   12/22/15 0649 12/23/15 0342  Weight: 135 lb (61.2 kg) 136 lb 3.9 oz (61.8 kg)    Physical Exam    GEN: Well nourished, well developed, in no acute distress.  HEENT: Grossly normal.  Neck: Supple, no JVD, carotid bruits, or masses. Cardiac: RRR, no murmurs, rubs, or gallops. No clubbing, cyanosis, edema.  Radials/DP/PT 2+ and equal bilaterally.  Respiratory:  Respirations regular and unlabored, clear to auscultation bilaterally. GI: Soft, nontender, nondistended, BS + x 4. MS: no deformity or atrophy. Skin: warm and dry, no rash. Neuro:  Strength and sensation are intact. Psych:  AAOx3.  Normal affect.  Labs    CBC  Recent Labs  12/22/15 0655 12/23/15 0336  WBC 6.9 8.8  NEUTROABS 4.6  --   HGB 13.7 12.0*  HCT 40.6 36.0*  MCV 91.4 91.4  PLT 225 867   Basic Metabolic Panel  Recent Labs  12/22/15 0655 12/23/15 0336  NA 135 136  K 4.6 4.3  CL 102 104  CO2 25 25  GLUCOSE 149* 100*  BUN 19 24*  CREATININE 1.59* 1.67*  CALCIUM 9.0 8.6*   Liver Function Tests  Recent Labs  12/22/15 0655 12/23/15 0336  AST 28 24  ALT 15* 12*  ALKPHOS 81 72  BILITOT 0.5 0.6  PROT 6.5 5.5*  ALBUMIN 2.9* 2.5*   No results for input(s): LIPASE, AMYLASE in the last 72 hours. Cardiac Enzymes  Recent Labs  12/22/15 1637 12/22/15 2104 12/23/15 0336  TROPONINI 7.10* 7.37* 6.33*   BNP Invalid input(s): POCBNP D-Dimer No results for input(s): DDIMER in the last 72 hours. Hemoglobin A1C  Recent Labs  12/22/15 1637  HGBA1C 6.9*   Fasting Lipid Panel  Recent Labs  12/23/15 0336  CHOL 218*  HDL 56  LDLCALC 144*  TRIG 92  CHOLHDL 3.9   Thyroid Function Tests  Recent Labs  12/22/15 1637  TSH 0.920    Telemetry    NSR- Personally Reviewed  ECG    NSR with inferolateral TWIs - Personally Reviewed  Radiology    Dg Chest 2 View  Result Date: 12/22/2015 CLINICAL DATA:  Worsening chest pain radiating to left arm for past 2 weeks. Hypertension, diabetes, and hypercholesterolemia. EXAM: CHEST  2 VIEW COMPARISON:  03/03/2010 FINDINGS: The heart size and mediastinal contours are within normal limits. Both lungs are clear. The visualized skeletal structures are unremarkable. IMPRESSION: Negative.  No active cardiopulmonary disease. Electronically Signed   By: Earle Gell M.D.   On: 12/22/2015 08:08    Cardiac Studies   12/22/15 Procedures  Coronary Stent Intervention  Left Heart Cath and Coronary Angiography  Conclusion    Prox RCA lesion, 20 %stenosed.  A STENT PROMUS PREM MR 2.75X28 drug eluting stent was successfully  placed.  Mid Cx lesion, 95 %stenosed.  Post intervention, there is a 0% residual stenosis.  A STENT PROMUS PREM MR 3.5X20 drug eluting stent was successfully placed, and overlaps previously placed stent.  Prox Cx to Mid Cx lesion, 99 %stenosed.  Post intervention, there is a 0% residual stenosis.  Ost 2nd Mrg to 2nd Mrg lesion, 70 %stenosed.  Post intervention, there is a 70% residual stenosis.  Ost 2nd Diag lesion, 100 %stenosed.  Prox LAD to Mid LAD lesion, 40 %stenosed.   1. Severe single vessel CAD.  2. Severe stenosis throughout the mid Circumflex involving the ostium of the moderate caliber OM2 branch.  3. Successful angioplasty OM2 4. Successful placement DES x 2 mid Circumflex.  5. Successful balloon angioplasty distal AV groove Circumflex (too small for a stent) 6. Moderate proximal LAD stenosis.   Recommendations: Will continue ASA and Brilinta for at least one year. (Will enroll in TWILIGHT study). Continue statin and beta blocker. Echo to assess LVEF.       Patient Profile     Jeffery Young is a 50 y.o. male with a history of hypertension, hyperlipidemia, diabetes type 2, ongoing tobacco abuse and strong family history of CAD (twin brother with MI) who presented to [facility] ED on 12/22/15 with ongoing chest pain 2 weeks. POC troponin 5 and cardiology called for admission  Assessment & Plan    NSTEMI: peak troponin 7.37. He underwent LHC on 12/22/15 which showed severe one vessel CAD s/p overlapping DES x2 and balloon angioplasty to distal AV groove Circumflex (too small for a stent).  -- He was started on ASA and Brilinta and will be enrolled in the Twilight study which is Brilinta + ASA x3 months followed by Brilinta + ASA or placebo.  -- Continue atorvastatin 80mg  daily and Coreg. I will increase Coreg 12.5 mg BID to 25mg  BID as he is hypertensive with HRs in 90s -- 2D ECHO pending.    CKD: unclear baseline. Creat 1.67 up from 1.59 after cath.  Will hold lisinopril 5mg  daily. If creat stabilizes this can be added back as an outpatient.   DMT2: HgA1c 6.9. Hold metformin 48 hours post cath. Continue SSI   HTN: BP uncontrolled. As above, I will increase Coreg from 12.5 mg BID to 25mg  BID. Home Lisinopril 5mg  daily has been held given elevated creat and need for cath  HLD: LDL 144. Goal <70. Home simvastatin changed to atorva 80 mg daily  Tobacco abuse: counseled on the  importance of cessation  Signed, Angelena Form, PA-C  12/23/2015, 8:05 AM   I have personally seen and examined this patient with Angelena Form, PA-C. I agree with the assessment and plan as outlined above. He was admitted with NSTEMI. Severe stenosis Circumflex treated with DES x 2. He is stable today on ASA/Brilinta/statin/Coreg. Ace-inh on hold with renal dysfunction. Echo today. I think we should monitor him today and gently hydrate. Follow renal function. If renal function stable, home tomorrow. Follow up with me after d/c. 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   Lauree Chandler 12/23/2015 8:55 AM

## 2015-12-23 NOTE — Progress Notes (Signed)
/  W  KIM  @ [facility] # [phone]   BRILINTA  90 MG BID  30/60TAB   COVER- YES  CO-PAY- $ 30.00  TIER- 2 DRUG  PRIOR APPROVAL - NO  PHARMACY : [facility]

## 2015-12-24 DIAGNOSIS — N183 Chronic kidney disease, stage 3 unspecified: Secondary | ICD-10-CM

## 2015-12-24 DIAGNOSIS — N179 Acute kidney failure, unspecified: Secondary | ICD-10-CM

## 2015-12-24 DIAGNOSIS — E7800 Pure hypercholesterolemia, unspecified: Secondary | ICD-10-CM

## 2015-12-24 DIAGNOSIS — N189 Chronic kidney disease, unspecified: Secondary | ICD-10-CM

## 2015-12-24 LAB — BASIC METABOLIC PANEL
Anion gap: 6 (ref 5–15)
BUN: 32 mg/dL — AB (ref 6–20)
CALCIUM: 8.5 mg/dL — AB (ref 8.9–10.3)
CHLORIDE: 105 mmol/L (ref 101–111)
CO2: 25 mmol/L (ref 22–32)
CREATININE: 1.7 mg/dL — AB (ref 0.61–1.24)
GFR calc non Af Amer: 46 mL/min — ABNORMAL LOW (ref >60)
GFR, EST AFRICAN AMERICAN: 53 mL/min — AB (ref >60)
GLUCOSE: 199 mg/dL — AB (ref 65–99)
Potassium: 4.5 mmol/L (ref 3.5–5.1)
Sodium: 136 mmol/L (ref 135–145)

## 2015-12-24 LAB — CBC
HEMATOCRIT: 36.2 % — AB (ref 39.0–52.0)
HEMOGLOBIN: 11.9 g/dL — AB (ref 13.0–17.0)
MCH: 30.3 pg (ref 26.0–34.0)
MCHC: 32.9 g/dL (ref 30.0–36.0)
MCV: 92.1 fL (ref 78.0–100.0)
Platelets: 198 10*3/uL (ref 150–400)
RBC: 3.93 MIL/uL — ABNORMAL LOW (ref 4.22–5.81)
RDW: 13.2 % (ref 11.5–15.5)
WBC: 6.8 10*3/uL (ref 4.0–10.5)

## 2015-12-24 LAB — GLUCOSE, CAPILLARY
GLUCOSE-CAPILLARY: 98 mg/dL (ref 65–99)
Glucose-Capillary: 216 mg/dL — ABNORMAL HIGH (ref 65–99)
Glucose-Capillary: 141 mg/dL — ABNORMAL HIGH (ref 65–99)
Glucose-Capillary: 292 mg/dL — ABNORMAL HIGH (ref 65–99)

## 2015-12-24 MED ORDER — HYDRALAZINE HCL 10 MG PO TABS
10.0000 mg | ORAL_TABLET | Freq: Three times a day (TID) | ORAL | Status: DC
  Administered 2015-12-24 – 2015-12-26 (×6): 10 mg via ORAL
  Filled 2015-12-24 (×6): qty 1

## 2015-12-24 MED ORDER — ISOSORBIDE MONONITRATE ER 30 MG PO TB24
30.0000 mg | ORAL_TABLET | Freq: Every day | ORAL | Status: DC
  Administered 2015-12-24 – 2015-12-26 (×3): 30 mg via ORAL
  Filled 2015-12-24 (×3): qty 1

## 2015-12-24 NOTE — Progress Notes (Signed)
Patient Name: Jeffery Young Date of Encounter: 12/24/2015  Primary Cardiologist:  Dr. Loel Dubonnet Problem List     Principal Problem:   NSTEMI (non-ST elevated myocardial infarction) [facility]) Active Problems:   Type II diabetes mellitus (HCC)   Tobacco abuse   Hypertension   Hypercholesteremia     Subjective   No complaints  Inpatient Medications    Scheduled Meds: . aspirin EC  81 mg Oral Daily  . atorvastatin  80 mg Oral q1800  . carvedilol  25 mg Oral BID WC  . insulin aspart  0-15 Units Subcutaneous TID WC  . sodium chloride flush  3 mL Intravenous Q12H  . ticagrelor  90 mg Oral BID   Continuous Infusions:  PRN Meds: sodium chloride, acetaminophen, ALPRAZolam, hydrALAZINE, nitroGLYCERIN, ondansetron (ZOFRAN) IV, sodium chloride flush   Vital Signs    Vitals:   12/23/15 1931 12/24/15 0428 12/24/15 0700 12/24/15 0800  BP: 129/86 (!) 177/94 (!) 143/89   Pulse: 83 90 80   Resp: 15 (!) 24 (!) 28 (!) 21  Temp: 97.5 F (36.4 C) 97 F (36.1 C) 98.2 F (36.8 C)   TempSrc: Oral Oral Oral   SpO2: 97% 97% 97%   Weight:  137 lb 2 oz (62.2 kg)    Height:        Intake/Output Summary (Last 24 hours) at 12/24/15 0900 Last data filed at 12/24/15 0700  Gross per 24 hour  Intake              600 ml  Output              500 ml  Net              100 ml   Filed Weights   12/22/15 0649 12/23/15 0342 12/24/15 0428  Weight: 135 lb (61.2 kg) 136 lb 3.9 oz (61.8 kg) 137 lb 2 oz (62.2 kg)    Physical Exam    GEN: Well nourished, well developed, in no acute distress.  HEENT: Grossly normal.  Neck: Supple, no JVD, carotid bruits, or masses. Cardiac: RRR, no murmurs, rubs, or gallops. No clubbing, cyanosis, edema.  Radials/DP/PT 2+ and equal bilaterally.  Respiratory:  Respirations regular and unlabored, clear to auscultation bilaterally. GI: Soft, nontender, nondistended, BS + x 4. MS: no deformity or atrophy. Skin: warm and dry, no rash. Neuro:   Strength and sensation are intact. Psych: AAOx3.  Normal affect.  Labs    CBC  Recent Labs  12/22/15 0655 12/23/15 0336 12/24/15 0236  WBC 6.9 8.8 6.8  NEUTROABS 4.6  --   --   HGB 13.7 12.0* 11.9*  HCT 40.6 36.0* 36.2*  MCV 91.4 91.4 92.1  PLT 225 219 324   Basic Metabolic Panel  Recent Labs  12/23/15 0336 12/24/15 0236  NA 136 136  K 4.3 4.5  CL 104 105  CO2 25 25  GLUCOSE 100* 199*  BUN 24* 32*  CREATININE 1.67* 1.70*  CALCIUM 8.6* 8.5*   Liver Function Tests  Recent Labs  12/22/15 0655 12/23/15 0336  AST 28 24  ALT 15* 12*  ALKPHOS 81 72  BILITOT 0.5 0.6  PROT 6.5 5.5*  ALBUMIN 2.9* 2.5*   No results for input(s): LIPASE, AMYLASE in the last 72 hours. Cardiac Enzymes  Recent Labs  12/22/15 1637 12/22/15 2104 12/23/15 0336  TROPONINI 7.10* 7.37* 6.33*   BNP Invalid input(s): POCBNP D-Dimer No results for input(s): DDIMER in the last 72 hours.  Hemoglobin A1C  Recent Labs  12/22/15 1637  HGBA1C 6.9*   Fasting Lipid Panel  Recent Labs  12/23/15 0336  CHOL 218*  HDL 56  LDLCALC 144*  TRIG 92  CHOLHDL 3.9   Thyroid Function Tests  Recent Labs  12/22/15 1637  TSH 0.920    Telemetry    NSR - Personally Reviewed  ECG    NSR, HR 80, TWI in 2, 3, aVF, V6, QTc 449  - Personally Reviewed  Radiology    No results found.  Cardiac Studies   Echo 12/23/15 - Left ventricle: The cavity size was normal. Wall thickness was   increased in a pattern of moderate LVH. Systolic function was   moderately to severely reduced. The estimated ejection fraction   was in the range of 30% to 35%. Akinesis of the inferior   myocardium. Features are consistent with a pseudonormal left   ventricular filling pattern, with concomitant abnormal relaxation   and increased filling pressure (grade 2 diastolic dysfunction). - Mitral valve: There was mild regurgitation. - Pericardium, extracardiac: A trivial pericardial effusion was    identified.  LHC 12/22/15 LAD proximal 40, D2 ostial 100 (filled by collaterals from D3) LCx proximal 99, mid 95; OM2 ostial 70 RCA proximal 20 PCI: 3.5 x 20 mm Promus Premier DES to proximal LCx; 2.75 x 28 mm Promus Premier DES to mid LCx; POBA to OM2  1. Severe single vessel CAD.  2. Severe stenosis throughout the mid Circumflex involving the ostium of the moderate caliber OM2 branch.  3. Successful angioplasty OM2 4. Successful placement DES x 2 mid Circumflex.  5. Successful balloon angioplasty distal AV groove Circumflex (too small for a stent) 6. Moderate proximal LAD stenosis.         Patient Profile     Jeffery Young is a 50 y.o. male with a hx of HTN, HL, DM2, tobacco abuse, FHx of CAD who presented to the hospital on 12/22/2015 with chest pain with assoc n/v, shortness of breath and diaphoresis x 2 weeks and a positive Troponin ruling him in for a NSTEMI.  ECG demonstrated IL TW inversions.  SCr was elevated upon presentation at 1.59. His ACE inhibitor was held and he was hydrated for cardiac catheterization. Troponin peaked at 7.37.  He underwent cardiac catheterization which demonstrated severe 1 V CAD with high grade stenosis in the LCx which was treated with overlapping DES x 2 and POBA to OM2.  He was enrolled in the TWILIGHT study and placed on Brilinta and ASA.  Creatinine did increase post Cath.  His blood pressure medications were adjusted for better control.  His statin was changed to high dose Atorvastatin due to elevated LDL and NSTEMI.  Echo yesterday shows EF 30-35 with inferior AK and grade 2 diastolic dysfunction.  Assessment & Plan    1. S/p NSTEMI - Patient presented with 2 weeks of chest pain and + Troponin.  LHC demonstrated 1 v CAD tx with overlapping DES x 2 to LCx and POBA to OM2.  He has not had any further CP. - Continue DAPT with ASA and Brilinta - continue BB and high dose statin.    2. Ischemic CM - EF 30-35 by echo.  He appears euvolemic on exam.  LVEDP  was 15-4mmHg.   - Avoid ACE I or ARB given worsening renal function.  Will start Hydralazine 10mg  TID and Imdur 30mg  daily  - Given ischemic LV dysfunction with EF 30-35% will need LifeVest until EF reassessed  by echo.  Will order today - Repeat echo in 2 months post revascularization.    3. CKD - Creatinine increased more since yesterday (1.03>>1.59>>1.67>>1.70) - Will gently hydrate and monitor overnight  4. DM2 - BS fairly controlled.   - Metformin on hold post cath - restart tomorrow.   - continue SS Insulin  5. HTN - BP poorly controlled - adding Hydralazine as above.  - continue BB  6. HL - Now on Atorvastatin 80. -  If patient it tolerating statin at time of TOC appt then repeat FLP and ALT in 6 weeks.   7. Tobacco abuse - Cessation has been stressed to patient this admit.  Signed, Fransico Him, MD 12/24/2015, 9:00 AM

## 2015-12-24 NOTE — Progress Notes (Signed)
CARDIAC REHAB PHASE I   PRE:  Rate/Rhythm: 76  BP:  Sitting: 135/80     SaO2: 96 ra  MODE:  Ambulation: 800 ft   POST:  Rate/Rhythm: 82  BP:  Sitting: 137/76     SaO2: 96% ra  8:14am-8:45am Patient ambulated well independently. One coughing fit. No shortness of breath. Toe still in pain. Overall he feels goods. Discussed smoking cessation. Moved to chair with call bell in reach.  [place], MS 12/24/2015 8:41 AM

## 2015-12-24 NOTE — Progress Notes (Signed)
Pt with order to transfer to 3W39. Report given to [facility]. V/S stable. No c/o any chest pain or discomfort. . Transferred without difficulty.

## 2015-12-25 DIAGNOSIS — Z955 Presence of coronary angioplasty implant and graft: Secondary | ICD-10-CM

## 2015-12-25 LAB — CBC
HCT: 34.2 % — ABNORMAL LOW (ref 39.0–52.0)
Hemoglobin: 11.2 g/dL — ABNORMAL LOW (ref 13.0–17.0)
MCH: 30 pg (ref 26.0–34.0)
MCHC: 32.7 g/dL (ref 30.0–36.0)
MCV: 91.7 fL (ref 78.0–100.0)
PLATELETS: 202 10*3/uL (ref 150–400)
RBC: 3.73 MIL/uL — AB (ref 4.22–5.81)
RDW: 13.1 % (ref 11.5–15.5)
WBC: 6.8 10*3/uL (ref 4.0–10.5)

## 2015-12-25 LAB — BASIC METABOLIC PANEL
ANION GAP: 8 (ref 5–15)
BUN: 27 mg/dL — ABNORMAL HIGH (ref 6–20)
CALCIUM: 8.5 mg/dL — AB (ref 8.9–10.3)
CO2: 24 mmol/L (ref 22–32)
Chloride: 105 mmol/L (ref 101–111)
Creatinine, Ser: 1.8 mg/dL — ABNORMAL HIGH (ref 0.61–1.24)
GFR, EST AFRICAN AMERICAN: 49 mL/min — AB (ref >60)
GFR, EST NON AFRICAN AMERICAN: 43 mL/min — AB (ref >60)
Glucose, Bld: 180 mg/dL — ABNORMAL HIGH (ref 65–99)
Potassium: 4.1 mmol/L (ref 3.5–5.1)
Sodium: 137 mmol/L (ref 135–145)

## 2015-12-25 LAB — GLUCOSE, CAPILLARY
GLUCOSE-CAPILLARY: 221 mg/dL — AB (ref 65–99)
GLUCOSE-CAPILLARY: 253 mg/dL — AB (ref 65–99)
Glucose-Capillary: 165 mg/dL — ABNORMAL HIGH (ref 65–99)
Glucose-Capillary: 160 mg/dL — ABNORMAL HIGH (ref 65–99)

## 2015-12-25 NOTE — Progress Notes (Signed)
Patient Name: Jeffery Young Date of Encounter: 12/25/2015  Primary Cardiologist:  Dr. Loel Dubonnet Problem List     Principal Problem:   NSTEMI (non-ST elevated myocardial infarction) [facility]) Active Problems:   Type II diabetes mellitus ([place])   Tobacco abuse   Hypertension   Hypercholesteremia   Acute kidney injury superimposed on CKD (HCC)     Subjective   No complaints. Creatinine pending this am, was hydrated overnight. Echo shows LVEF 30-35%. Plan was for LifeVest is LV function remains low.  Inpatient Medications    Scheduled Meds: . aspirin EC  81 mg Oral Daily  . atorvastatin  80 mg Oral q1800  . carvedilol  25 mg Oral BID WC  . hydrALAZINE  10 mg Oral Q8H  . insulin aspart  0-15 Units Subcutaneous TID WC  . isosorbide mononitrate  30 mg Oral Daily  . sodium chloride flush  3 mL Intravenous Q12H  . ticagrelor  90 mg Oral BID   Continuous Infusions:  PRN Meds: sodium chloride, acetaminophen, ALPRAZolam, hydrALAZINE, nitroGLYCERIN, ondansetron (ZOFRAN) IV, sodium chloride flush   Vital Signs    Vitals:   12/24/15 2041 12/24/15 2130 12/25/15 0537 12/25/15 0903  BP: 120/76 130/80 137/88 (!) 150/93  Pulse: 87  79   Resp: 19  18   Temp: 99 F (37.2 C)  98.9 F (37.2 C)   TempSrc:      SpO2: 98%  99%   Weight:   131 lb (59.4 kg)   Height:        Intake/Output Summary (Last 24 hours) at 12/25/15 0946 Last data filed at 12/25/15 0900  Gross per 24 hour  Intake             1102 ml  Output              300 ml  Net              802 ml   Filed Weights   12/23/15 0342 12/24/15 0428 12/25/15 0537  Weight: 136 lb 3.9 oz (61.8 kg) 137 lb 2 oz (62.2 kg) 131 lb (59.4 kg)    Physical Exam    GEN: Well nourished, well developed, in no acute distress.  HEENT: Grossly normal.  Neck: Supple, no JVD, carotid bruits, or masses. Cardiac: RRR, no murmurs, rubs, or gallops. No clubbing, cyanosis, edema.  Radials/DP/PT 2+ and equal bilaterally.    Respiratory:  Respirations regular and unlabored, clear to auscultation bilaterally. GI: Soft, nontender, nondistended, BS + x 4. MS: no deformity or atrophy. Skin: warm and dry, no rash. Neuro:  Strength and sensation are intact. Psych: AAOx3.  Normal affect.  Labs    CBC  Recent Labs  12/24/15 0236 12/25/15 0519  WBC 6.8 6.8  HGB 11.9* 11.2*  HCT 36.2* 34.2*  MCV 92.1 91.7  PLT 198 354   Basic Metabolic Panel  Recent Labs  12/23/15 0336 12/24/15 0236  NA 136 136  K 4.3 4.5  CL 104 105  CO2 25 25  GLUCOSE 100* 199*  BUN 24* 32*  CREATININE 1.67* 1.70*  CALCIUM 8.6* 8.5*   Liver Function Tests  Recent Labs  12/23/15 0336  AST 24  ALT 12*  ALKPHOS 72  BILITOT 0.6  PROT 5.5*  ALBUMIN 2.5*   No results for input(s): LIPASE, AMYLASE in the last 72 hours. Cardiac Enzymes  Recent Labs  12/22/15 1637 12/22/15 2104 12/23/15 0336  TROPONINI 7.10* 7.37* 6.33*   BNP Invalid  input(s): POCBNP D-Dimer No results for input(s): DDIMER in the last 72 hours. Hemoglobin A1C  Recent Labs  12/22/15 1637  HGBA1C 6.9*   Fasting Lipid Panel  Recent Labs  12/23/15 0336  CHOL 218*  HDL 56  LDLCALC 144*  TRIG 92  CHOLHDL 3.9   Thyroid Function Tests  Recent Labs  12/22/15 1637  TSH 0.920    Telemetry    NSR - Personally Reviewed  ECG    NSR, HR 80, TWI in 2, 3, aVF, V6, QTc 449  - Personally Reviewed  Radiology    No results found.  Cardiac Studies   Echo 12/23/15 - Left ventricle: The cavity size was normal. Wall thickness was   increased in a pattern of moderate LVH. Systolic function was   moderately to severely reduced. The estimated ejection fraction   was in the range of 30% to 35%. Akinesis of the inferior   myocardium. Features are consistent with a pseudonormal left   ventricular filling pattern, with concomitant abnormal relaxation   and increased filling pressure (grade 2 diastolic dysfunction). - Mitral valve: There was  mild regurgitation. - Pericardium, extracardiac: A trivial pericardial effusion was   identified.  LHC 12/22/15 LAD proximal 40, D2 ostial 100 (filled by collaterals from D3) LCx proximal 99, mid 95; OM2 ostial 70 RCA proximal 20 PCI: 3.5 x 20 mm Promus Premier DES to proximal LCx; 2.75 x 28 mm Promus Premier DES to mid LCx; POBA to OM2  1. Severe single vessel CAD.  2. Severe stenosis throughout the mid Circumflex involving the ostium of the moderate caliber OM2 branch.  3. Successful angioplasty OM2 4. Successful placement DES x 2 mid Circumflex.  5. Successful balloon angioplasty distal AV groove Circumflex (too small for a stent) 6. Moderate proximal LAD stenosis.         Patient Profile     Jeffery Young is a 50 y.o. male with a hx of HTN, HL, DM2, tobacco abuse, FHx of CAD who presented to the hospital on 12/22/2015 with chest pain with assoc n/v, shortness of breath and diaphoresis x 2 weeks and a positive Troponin ruling him in for a NSTEMI.  ECG demonstrated IL TW inversions.  SCr was elevated upon presentation at 1.59. His ACE inhibitor was held and he was hydrated for cardiac catheterization. Troponin peaked at 7.37.  He underwent cardiac catheterization which demonstrated severe 1 V CAD with high grade stenosis in the LCx which was treated with overlapping DES x 2 and POBA to OM2.  He was enrolled in the TWILIGHT study and placed on Brilinta and ASA.  Creatinine did increase post Cath.  His blood pressure medications were adjusted for better control.  His statin was changed to high dose Atorvastatin due to elevated LDL and NSTEMI.  Echo yesterday shows EF 30-35 with inferior AK and grade 2 diastolic dysfunction.  Assessment & Plan    1. S/p NSTEMI - Patient presented with 2 weeks of chest pain and + Troponin.  LHC demonstrated 1 v CAD tx with overlapping DES x 2 to LCx and POBA to OM2.  He has not had any further CP. - Continue DAPT with ASA and Brilinta - continue BB and high  dose statin.    2. Ischemic CM - EF 30-35 by echo.  He appears euvolemic on exam.  LVEDP was 15-40mmHg.   - Avoid ACE I or ARB given worsening renal function.  Will start Hydralazine 10mg  TID and Imdur 30mg  daily  -  Given ischemic LV dysfunction with EF 30-35% will need LifeVest - ordered by Dr. Radford Pax. - Repeat echo in 3 months post revascularization.   - Will likely need home lasix 40 mg daily at discharge  3. CKD - Creatinine increased more since yesterday (1.03>>1.59>>1.67>>1.70) - Hydrated overnight, BMET not ordered today, will order now  4. DM2 - BS fairly controlled.   - Metformin on hold post cath - restart tomorrow.   - continue SS Insulin  5. HTN - BP poorly controlled - adding Hydralazine as above.  - continue BB  6. HL - Now on Atorvastatin 80. -  If patient it tolerating statin at time of TOC appt then repeat FLP and ALT in 6 weeks.   D/c possibly later today if creatinine is improved and he can be fitted with LifeVest, otherwise, likely tomorrow am.  Pixie Casino, MD, [facility] Attending Cardiologist [facility]  12/25/2015, 9:46 AM

## 2015-12-25 NOTE — Progress Notes (Signed)
   Recent Labs  12/22/15 0655 12/23/15 0336 12/24/15 0236 12/25/15 1039  CREATININE 1.59* 1.67* 1.70* 1.80*    D/w Dr. K. Mali Hilty. As Creatinine continues to rise, will hold off on DC. Check BMET tomorrow AM. Will likely be able to DC home once Creatinine starts to decline. Richardson Dopp, PA-C   12/25/2015 12:55 PM

## 2015-12-25 NOTE — Plan of Care (Signed)
Problem: Education: Goal: Understanding of medication regimen will improve Outcome: Progressing Reviewed with patient and his wife about Brilinta, uses and side effects to call for, also discussed Coreg, written info provided for both meds and we also reviewed CHF and CAD and S/S to call for help, patient and wife asked appropriate questions and verbalized understanding of what was discussed.

## 2015-12-26 ENCOUNTER — Encounter: Payer: Self-pay | Admitting: *Deleted

## 2015-12-26 DIAGNOSIS — Z006 Encounter for examination for normal comparison and control in clinical research program: Secondary | ICD-10-CM

## 2015-12-26 LAB — GLUCOSE, CAPILLARY
GLUCOSE-CAPILLARY: 159 mg/dL — AB (ref 65–99)
Glucose-Capillary: 195 mg/dL — ABNORMAL HIGH (ref 65–99)

## 2015-12-26 LAB — BASIC METABOLIC PANEL
Anion gap: 6 (ref 5–15)
BUN: 29 mg/dL — AB (ref 6–20)
CO2: 25 mmol/L (ref 22–32)
CREATININE: 1.7 mg/dL — AB (ref 0.61–1.24)
Calcium: 8.6 mg/dL — ABNORMAL LOW (ref 8.9–10.3)
Chloride: 105 mmol/L (ref 101–111)
GFR calc Af Amer: 53 mL/min — ABNORMAL LOW (ref >60)
GFR, EST NON AFRICAN AMERICAN: 46 mL/min — AB (ref >60)
Glucose, Bld: 159 mg/dL — ABNORMAL HIGH (ref 65–99)
Potassium: 4.1 mmol/L (ref 3.5–5.1)
SODIUM: 136 mmol/L (ref 135–145)

## 2015-12-26 MED ORDER — CARVEDILOL 25 MG PO TABS: 25.0000 mg | ORAL_TABLET | Freq: Two times a day (BID) | ORAL | 6 refills | Status: DC

## 2015-12-26 MED ORDER — ISOSORBIDE MONONITRATE ER 30 MG PO TB24: 30.0000 mg | ORAL_TABLET | Freq: Every day | ORAL | 6 refills | Status: DC

## 2015-12-26 MED ORDER — HYDRALAZINE HCL 25 MG PO TABS
25.0000 mg | ORAL_TABLET | Freq: Three times a day (TID) | ORAL | Status: DC
  Administered 2015-12-26: 25 mg via ORAL
  Filled 2015-12-26: qty 1

## 2015-12-26 MED ORDER — ATORVASTATIN CALCIUM 80 MG PO TABS: 80.0000 mg | ORAL_TABLET | Freq: Every day | ORAL | 6 refills | Status: DC

## 2015-12-26 MED ORDER — ASPIRIN 81 MG PO TBEC: 81.0000 mg | DELAYED_RELEASE_TABLET | Freq: Every day | ORAL | Status: DC

## 2015-12-26 MED ORDER — TICAGRELOR 90 MG PO TABS: 90.0000 mg | ORAL_TABLET | Freq: Two times a day (BID) | ORAL | Status: DC

## 2015-12-26 MED ORDER — HYDRALAZINE HCL 25 MG PO TABS: 25.0000 mg | ORAL_TABLET | Freq: Three times a day (TID) | ORAL | 6 refills | Status: DC

## 2015-12-26 MED ORDER — NITROGLYCERIN 0.4 MG SL SUBL: 0.4000 mg | SUBLINGUAL_TABLET | SUBLINGUAL | 3 refills | Status: AC | PRN

## 2015-12-26 NOTE — Progress Notes (Signed)
Patient had cardiac rehab contact me to enroll in the  [facility] Research study.  Thanks for Rehab's assistance.

## 2015-12-26 NOTE — Progress Notes (Signed)
Updated report received in patient's room via [facility], reviewed new orders, POC and events of the day, assumed care of patient. Patient's spouse addressed her concern that he get some medication and instruction on how to stop smoking for when he goes home. She said that he is a 2-3 pack/day chain smoker and she wants him to continue not to smoke when he gets home. She also addressed concerns regarding purchasing B/P cuff and notes for absenteeism from his place of employment. I will try to remember to address with the day nurse, showed patient how to take his pulse and what was normal and when to call for help, all questions answered and both patient and his spouse verbalized understanding.

## 2015-12-26 NOTE — Research (Signed)
TWILIGHT Informed Consent   Subject Name: Jeffery Young  Subject met inclusion and exclusion criteria.  The informed consent form, study requirements and expectations were reviewed with the subject and questions and concerns were addressed prior to the signing of the consent form.  The subject verbalized understanding of the trial requirements.  The subject agreed to participate in the TWILIGHT trial and signed the informed consent.  The informed consent was obtained prior to performance of any protocol-specific procedures for the subject.  A copy of the signed informed consent was given to the subject and a copy was placed in the subject's medical record.  [name] 12/26/2015, 1115 am

## 2015-12-26 NOTE — Progress Notes (Signed)
CARDIAC REHAB PHASE I   PRE:  Rate/Rhythm: 71 SR    BP: sitting 143/94    SaO2:   MODE:  Ambulation: 1100 ft   POST:  Rate/Rhythm: 82 SR    BP: sitting 143/86     SaO2:   Tolerated well, no c/o. Ed reviewed with good understanding. Reinforced smoking cessation, pt is still worried about quitting. Gave him more tips and encouragement and resources. Discussed HF booklet and signs and sx. Gave videos to watch. Pt is now interested in Twilight trial. I am trying to contact research. [phone]   [place], ACSM 12/26/2015 10:36 AM

## 2015-12-26 NOTE — Discharge Summary (Signed)
Discharge Summary    Patient ID: Jeffery Young,  MRN: [id number], DOB/AGE: 49-Dec-1967 49 y.o.  Admit date: 12/22/2015 Discharge date: 12/26/2015  Primary Care Provider: Andria Frames Primary Cardiologist: Dr. Angelena Form   Discharge Diagnoses    Principal Problem:   NSTEMI (non-ST elevated myocardial infarction) [facility]) Active Problems:   Type II diabetes mellitus ([place])   Tobacco abuse   Hypertension   Hypercholesteremia   Acute kidney injury superimposed on CKD [facility])   Status post coronary artery stent placement   Allergies No Known Allergies  Diagnostic Studies/Procedures    LHC: 12/22/15  Conclusion     Prox RCA lesion, 20 %stenosed.  A STENT PROMUS PREM MR 2.75X28 drug eluting stent was successfully placed.  Mid Cx lesion, 95 %stenosed.  Post intervention, there is a 0% residual stenosis.  A STENT PROMUS PREM MR 3.5X20 drug eluting stent was successfully placed, and overlaps previously placed stent.  Prox Cx to Mid Cx lesion, 99 %stenosed.  Post intervention, there is a 0% residual stenosis.  Ost 2nd Mrg to 2nd Mrg lesion, 70 %stenosed.  Post intervention, there is a 70% residual stenosis.  Ost 2nd Diag lesion, 100 %stenosed.  Prox LAD to Mid LAD lesion, 40 %stenosed.   1. Severe single vessel CAD.  2. Severe stenosis throughout the mid Circumflex involving the ostium of the moderate caliber OM2 branch.  3. Successful angioplasty OM2 4. Successful placement DES x 2 mid Circumflex.  5. Successful balloon angioplasty distal AV groove Circumflex (too small for a stent) 6. Moderate proximal LAD stenosis.   Recommendations: Will continue ASA and Brilinta for at least one year. (Will enroll in TWILIGHT study). Continue statin and beta blocker. Echo to assess LVEF.    TTE: 12/23/15  Study Conclusions  - Left ventricle: The cavity size was normal. Wall thickness was   increased in a pattern of moderate LVH. Systolic function was   moderately to  severely reduced. The estimated ejection fraction   was in the range of 30% to 35%. Akinesis of the inferior   myocardium. Features are consistent with a pseudonormal left   ventricular filling pattern, with concomitant abnormal relaxation   and increased filling pressure (grade 2 diastolic dysfunction). - Mitral valve: There was mild regurgitation. - Pericardium, extracardiac: A trivial pericardial effusion was   identified. _____________   History of Present Illness     Jeffery Young is a 50 y.o. male with a history of hypertension, hyperlipidemia, diabetes type 2, ongoing tobacco abuse and strong family history of CAD (twin brother with MI) who presented to [facility] ED on 11/16 with ongoing chest pain 2 weeks. POC troponin 5 and cardiology called for admission  He was seen by Dr. Tamala Julian in 2012 for evaluation of chest pain. He underwent a stress echo but was unable to achieve a target heart rate of 158 beats per minute. Exercise was stopped because of leg pain. There was a question of EKG changes that were concerning but there were no wall motion abnormalities noted on stress echo. Cardiac markers x4 were negative and he was chest pain free so he was discharge home.    He was in his usual state of health until 2 weeks ago when he started to notice chest pain associated with nausea/vomitting, diaphoresis and shortness of breath. It seemed to occur on his way home from work and when he was trying to go to sleep. Felt like there was bruising in his chest area with radiation  into his neck. He would try taking Advil for some relief which did help. It is occurred always every night for 2 weeks and he finally decided that he needed to be evaluated. Not necessarily related to exertion. He is pretty active on the job and has not had chest pain there. No lower extremity edema, orthopnea or PND. No dizziness or syncope. He smokes almost a pack a day for many years. His twin brother in [place]  had a MI and stroke recently.   Hospital Course     Consultants: None  His POC trop in the ED was 5.8, and EKG showed inferolateral TWIs. He was started on IV heparin and taken for LHC that afternoon that showed severe single vessel disease in the LCx with 95 % stenosis that was treated with a DES x1 and POBA to OM2. Plan will be to continued on DAPT with ASA and Brilinta for at least one year. He will continue on statin and BB therapy. Follow up echo showed a reduced EF of 30-35% with akinesis of the inferior myocardium with G2DD. He did have a noted rise in his Cr following cath, and was held for gentle IV hydration. His blood pressure remained uncontrolled, therefore hydralazine was added with improvement. He was also fitted for a LifeVest given his decrease in EF noted on echo. Original plan was for discharge on 11/19, but his had a slight increase in Cr to 1.8, and he was held overnight for follow up BMET.   On 11/20 his Cr improved to 1.7. He reported feeling well. Worked well with cardiac rehab. He was seen and assessed by Dr. Meda Coffee and determined stable for discharge home. He has follow up arranged in the office next week. He has been enrolled in the Rainbow study and received his Brilinta and ASA prior to discharge.  _____________  Discharge Vitals Blood pressure (!) 157/90, pulse 72, temperature 98.9 F (37.2 C), temperature source Oral, resp. rate 18, height 5\' 5"  (1.651 m), weight 126 lb 9.6 oz (57.4 kg), SpO2 99 %.  Filed Weights   12/24/15 0428 12/25/15 0537 12/26/15 0529  Weight: 137 lb 2 oz (62.2 kg) 131 lb (59.4 kg) 126 lb 9.6 oz (57.4 kg)    Labs & Radiologic Studies    CBC  Recent Labs  12/24/15 0236 12/25/15 0519  WBC 6.8 6.8  HGB 11.9* 11.2*  HCT 36.2* 34.2*  MCV 92.1 91.7  PLT 198 093   Basic Metabolic Panel  Recent Labs  12/25/15 1039 12/26/15 0323  NA 137 136  K 4.1 4.1  CL 105 105  CO2 24 25  GLUCOSE 180* 159*  BUN 27* 29*  CREATININE 1.80*  1.70*  CALCIUM 8.5* 8.6*   Liver Function Tests No results for input(s): AST, ALT, ALKPHOS, BILITOT, PROT, ALBUMIN in the last 72 hours. No results for input(s): LIPASE, AMYLASE in the last 72 hours. Cardiac Enzymes No results for input(s): CKTOTAL, CKMB, CKMBINDEX, TROPONINI in the last 72 hours. BNP Invalid input(s): POCBNP D-Dimer No results for input(s): DDIMER in the last 72 hours. Hemoglobin A1C No results for input(s): HGBA1C in the last 72 hours. Fasting Lipid Panel No results for input(s): CHOL, HDL, LDLCALC, TRIG, CHOLHDL, LDLDIRECT in the last 72 hours. Thyroid Function Tests No results for input(s): TSH, T4TOTAL, T3FREE, THYROIDAB in the last 72 hours.  Invalid input(s): FREET3 _____________  Dg Chest 2 View  Result Date: 12/22/2015 CLINICAL DATA:  Worsening chest pain radiating to left arm for past 2  weeks. Hypertension, diabetes, and hypercholesterolemia. EXAM: CHEST  2 VIEW COMPARISON:  03/03/2010 FINDINGS: The heart size and mediastinal contours are within normal limits. Both lungs are clear. The visualized skeletal structures are unremarkable. IMPRESSION: Negative.  No active cardiopulmonary disease. Electronically Signed   By: Earle Gell M.D.   On: 12/22/2015 08:08   Disposition   Pt is being discharged home today in good condition.  Follow-up Plans & Appointments    Follow-up Information    Richardson Dopp, PA-C Follow up.   Specialties:  Cardiology, Physician Assistant Why:  at 12:00pm for your hospital follow up.  Contact information: [address] [address] [phone]          Discharge Instructions    (HEART FAILURE PATIENTS) Call MD:  Anytime you have any of the following symptoms: 1) 3 pound weight gain in 24 hours or 5 pounds in 1 week 2) shortness of breath, with or without a dry hacking cough 3) swelling in the hands, feet or stomach 4) if you have to sleep on extra pillows at night in order to breathe.     Complete by:  As directed    Amb Referral to Cardiac Rehabilitation    Complete by:  As directed    Diagnosis:   Coronary Stents NSTEMI PTCA     Diet - low sodium heart healthy    Complete by:  As directed    Discharge instructions    Complete by:  As directed    Radial Site Care Refer to this sheet in the next few weeks. These instructions provide you with information on caring for yourself after your procedure. Your caregiver may also give you more specific instructions. Your treatment has been planned according to current medical practices, but problems sometimes occur. Call your caregiver if you have any problems or questions after your procedure. HOME CARE INSTRUCTIONS You may shower the day after the procedure.Remove the bandage (dressing) and gently wash the site with plain soap and water.Gently pat the site dry.  Do not apply powder or lotion to the site.  Do not submerge the affected site in water for 3 to 5 days.  Inspect the site at least twice daily.  Do not flex or bend the affected arm for 24 hours.  No lifting over 5 pounds (2.3 kg) for 5 days after your procedure.  Do not drive home if you are discharged the same day of the procedure. Have someone else drive you.  You may drive 24 hours after the procedure unless otherwise instructed by your caregiver.  What to expect: Any bruising will usually fade within 1 to 2 weeks.  Blood that collects in the tissue (hematoma) may be painful to the touch. It should usually decrease in size and tenderness within 1 to 2 weeks.  SEEK IMMEDIATE MEDICAL CARE IF: You have unusual pain at the radial site.  You have redness, warmth, swelling, or pain at the radial site.  You have drainage (other than a small amount of blood on the dressing).  You have chills.  You have a fever or persistent symptoms for more than 72 hours.  You have a fever and your symptoms suddenly get worse.  Your arm becomes pale, cool, tingly, or numb.  You have  heavy bleeding from the site. Hold pressure on the site.   Increase activity slowly    Complete by:  As directed       Discharge Medications   Current Discharge  Medication List    START taking these medications   Details  aspirin EC 81 MG EC tablet Take 1 tablet (81 mg total) by mouth daily.    atorvastatin (LIPITOR) 80 MG tablet Take 1 tablet (80 mg total) by mouth daily at 6 PM. Qty: 30 tablet, Refills: 6    carvedilol (COREG) 25 MG tablet Take 1 tablet (25 mg total) by mouth 2 (two) times daily with a meal. Qty: 60 tablet, Refills: 6    hydrALAZINE (APRESOLINE) 25 MG tablet Take 1 tablet (25 mg total) by mouth 3 (three) times daily. Qty: 90 tablet, Refills: 6    isosorbide mononitrate (IMDUR) 30 MG 24 hr tablet Take 1 tablet (30 mg total) by mouth daily. Qty: 30 tablet, Refills: 6    nitroGLYCERIN (NITROSTAT) 0.4 MG SL tablet Place 1 tablet (0.4 mg total) under the tongue every 5 (five) minutes x 3 doses as needed for chest pain. Qty: 25 tablet, Refills: 3    ticagrelor (BRILINTA) 90 MG TABS tablet Take 1 tablet (90 mg total) by mouth 2 (two) times daily. Qty: 60 tablet      CONTINUE these medications which have NOT CHANGED   Details  ibuprofen (ADVIL,MOTRIN) 200 MG tablet Take 200 mg by mouth every 6 (six) hours as needed for moderate pain.    metFORMIN (GLUCOPHAGE) 1000 MG tablet Take 1,000 mg by mouth 2 (two) times daily with a meal.      STOP taking these medications     lisinopril (PRINIVIL,ZESTRIL) 5 MG tablet      simvastatin (ZOCOR) 20 MG tablet          Aspirin prescribed at discharge?  Yes High Intensity Statin Prescribed? (Lipitor 40-80mg  or Crestor 20-40mg ): Yes Beta Blocker Prescribed? Yes For EF <40%, was ACEI/ARB Prescribed? No: elevated cr ADP Receptor Inhibitor Prescribed? (i.e. Plavix etc.-Includes Medically Managed Patients): Yes For EF <40%, Aldosterone Inhibitor Prescribed? No:  Was EF assessed during THIS hospitalization? Yes Was  Cardiac Rehab II ordered? (Included Medically managed Patients): Yes   Outstanding Labs/Studies   BMET at office visit. FLP and LFTs in 6 weeks if tolerating statin. Echo in 6 weeks for follow up EF.  Duration of Discharge Encounter   Greater than 30 minutes including physician time.  Signed, Reino Bellis NP-C 12/26/2015, 12:42 PM

## 2015-12-26 NOTE — Progress Notes (Signed)
Inpatient Diabetes Program Recommendations  AACE/ADA: New Consensus Statement on Inpatient Glycemic Control (2015)  Target Ranges:  Prepandial:   less than 140 mg/dL      Peak postprandial:   less than 180 mg/dL (1-2 hours)      Critically ill patients:  140 - 180 mg/dL   Results for GALEN, RUSSMAN (MRN [id number]) as of 12/26/2015 11:22  Ref. Range 12/25/2015 08:12 12/25/2015 12:06 12/25/2015 17:24 12/25/2015 21:51 12/26/2015 08:06  Glucose-Capillary Latest Ref Range: 65 - 99 mg/dL 165 (H) 160 (H) 221 (H) 253 (H) 159 (H)   Review of Glycemic Control  Diabetes history: DM2 Outpatient Diabetes medications: Metformin 1000 mg BID Current orders for Inpatient glycemic control: Novolog 0-15 units TID with meals  Inpatient Diabetes Program Recommendations Insulin - Meal Coverage: While inpatient please consider ordering Novolog 3 units TID with meals for meal coverage if patient eats at least 50% of meals.  Thanks, Barnie Alderman, RN, MSN, CDE Diabetes Coordinator Inpatient Diabetes Program [phone] (Team Pager from 8am to 5pm)

## 2015-12-26 NOTE — Progress Notes (Signed)
Spoke with patient and his wife (via speakerphone) regarding the TWILIGHT Research study.  Biggest concern was this being a study.  Wife would not let the patient commit until she had an opportunity to research Brilinta and the study.  I called before leaving on Friday, 12/23/2015 and left a call back number with Clair Gulling, RN to contact me if patient expresses interest.  As of this morning have still not heard from patient regarding study.

## 2015-12-26 NOTE — Progress Notes (Signed)
Patient Name: Jeffery Young Date of Encounter: 12/26/2015  Primary Cardiologist:  Dr. Loel Dubonnet Problem List     Principal Problem:   NSTEMI (non-ST elevated myocardial infarction) [facility]) Active Problems:   Type II diabetes mellitus ([place])   Tobacco abuse   Hypertension   Hypercholesteremia   Acute kidney injury superimposed on CKD [facility])   Status post coronary artery stent placement   Subjective   No complaints. Cr delayed dc yesterday, but has improved today. Fitted for [place] yesterday.   Inpatient Medications    Scheduled Meds: . aspirin EC  81 mg Oral Daily  . atorvastatin  80 mg Oral q1800  . carvedilol  25 mg Oral BID WC  . hydrALAZINE  10 mg Oral Q8H  . insulin aspart  0-15 Units Subcutaneous TID WC  . isosorbide mononitrate  30 mg Oral Daily  . sodium chloride flush  3 mL Intravenous Q12H  . ticagrelor  90 mg Oral BID   Continuous Infusions:  PRN Meds: sodium chloride, acetaminophen, ALPRAZolam, hydrALAZINE, nitroGLYCERIN, ondansetron (ZOFRAN) IV, sodium chloride flush   Vital Signs    Vitals:   12/25/15 2100 12/25/15 2231 12/26/15 0529 12/26/15 0557  BP: (!) 142/84 (!) 144/87 (!) 157/90 (!) 151/97  Pulse: 71  77   Resp: 17  18   Temp: 98.3 F (36.8 C)  99.2 F (37.3 C)   TempSrc:      SpO2: 98%  99%   Weight:   126 lb 9.6 oz (57.4 kg)   Height:        Intake/Output Summary (Last 24 hours) at 12/26/15 0804 Last data filed at 12/26/15 0529  Gross per 24 hour  Intake             1200 ml  Output                0 ml  Net             1200 ml   Filed Weights   12/24/15 0428 12/25/15 0537 12/26/15 0529  Weight: 137 lb 2 oz (62.2 kg) 131 lb (59.4 kg) 126 lb 9.6 oz (57.4 kg)    Physical Exam    GEN: Well nourished, well developed, in no acute distress.  HEENT: Grossly normal.  Neck: Supple, no JVD, carotid bruits, or masses. Cardiac: RRR, no murmurs, rubs, or gallops. No clubbing, cyanosis, edema.  Radials/DP/PT 2+ and  equal bilaterally.  Respiratory:  Respirations regular and unlabored, clear to auscultation bilaterally. GI: Soft, nontender, nondistended, BS + x 4. MS: no deformity or atrophy. Skin: warm and dry, no rash. Neuro:  Strength and sensation are intact. Psych: AAOx3.  Normal affect.  Labs    CBC  Recent Labs  12/24/15 0236 12/25/15 0519  WBC 6.8 6.8  HGB 11.9* 11.2*  HCT 36.2* 34.2*  MCV 92.1 91.7  PLT 198 092   Basic Metabolic Panel  Recent Labs  12/25/15 1039 12/26/15 0323  NA 137 136  K 4.1 4.1  CL 105 105  CO2 24 25  GLUCOSE 180* 159*  BUN 27* 29*  CREATININE 1.80* 1.70*  CALCIUM 8.5* 8.6*   Telemetry    NSR - Personally Reviewed  ECG    NSR, HR 80, TWI in 2, 3, aVF, V6, QTc 449  - Personally Reviewed  Radiology    No results found.  Cardiac Studies   Echo 12/23/15 - Left ventricle: The cavity size was normal. Wall thickness was   increased  in a pattern of moderate LVH. Systolic function was   moderately to severely reduced. The estimated ejection fraction   was in the range of 30% to 35%. Akinesis of the inferior   myocardium. Features are consistent with a pseudonormal left   ventricular filling pattern, with concomitant abnormal relaxation   and increased filling pressure (grade 2 diastolic dysfunction). - Mitral valve: There was mild regurgitation. - Pericardium, extracardiac: A trivial pericardial effusion was   identified.  LHC 12/22/15 LAD proximal 40, D2 ostial 100 (filled by collaterals from D3) LCx proximal 99, mid 95; OM2 ostial 70 RCA proximal 20 PCI: 3.5 x 20 mm Promus Premier DES to proximal LCx; 2.75 x 28 mm Promus Premier DES to mid LCx; POBA to OM2  1. Severe single vessel CAD.  2. Severe stenosis throughout the mid Circumflex involving the ostium of the moderate caliber OM2 branch.  3. Successful angioplasty OM2 4. Successful placement DES x 2 mid Circumflex.  5. Successful balloon angioplasty distal AV groove Circumflex (too  small for a stent) 6. Moderate proximal LAD stenosis.         Patient Profile     Jeffery Young is a 50 y.o. male with a hx of HTN, HL, DM2, tobacco abuse, FHx of CAD who presented to the hospital on 12/22/2015 with chest pain with assoc n/v, shortness of breath and diaphoresis x 2 weeks and a positive Troponin ruling him in for a NSTEMI.  ECG demonstrated IL TW inversions.  SCr was elevated upon presentation at 1.59. His ACE inhibitor was held and he was hydrated for cardiac catheterization. Troponin peaked at 7.37.  He underwent cardiac catheterization which demonstrated severe 1 V CAD with high grade stenosis in the LCx which was treated with overlapping DES x 2 and POBA to OM2.  He was enrolled in the TWILIGHT study and placed on Brilinta and ASA.  Creatinine did increase post Cath.  His blood pressure medications were adjusted for better control.  His statin was changed to high dose Atorvastatin due to elevated LDL and NSTEMI.  Echo showed EF 30-35 with inferior AK and grade 2 diastolic dysfunction.  Assessment & Plan    1. S/p NSTEMI - Patient presented with 2 weeks of chest pain and + Troponin.  LHC demonstrated 1 v CAD tx with overlapping DES x 2 to LCx and POBA to OM2.  He has not had any further CP. - Continue DAPT with ASA and Brilinta - continue BB and high dose statin.    2. Ischemic CM - EF 30-35 by echo.  He appears euvolemic on exam.  LVEDP was 15-41mmHg.   - Avoid ACE I or ARB given worsening renal function.  Started yesterday on  Hydralazine 10mg  TID and Imdur 30mg  daily  - Given ischemic LV dysfunction with EF 30-35% will need LifeVest - ordered by Dr. Radford Pax. - Repeat echo in 3 months post revascularization.   - Will likely need home lasix 40 mg daily at discharge- per Dr. Lysbeth Penner previous note  3. CKD - Creatinine increased more since yesterday (1.03>>1.59>>1.67>>1.70>>1.80) - Cr yesterday increased 1.80. Held discharge yesterday given increase. Repeat this morning 1.7.    4. DM2 - BS fairly controlled.   - Metformin on hold post cath - can restart at discharge   - continue SS Insulin  5. HTN - BP with some improvement - Hydralazine as above.  - continue BB  6. HL - Now on Atorvastatin 80. -  If patient it tolerating statin  at time of TOC appt then repeat FLP and ALT in 6 weeks.    Reino Bellis NP-C 12/26/2015, 8:04 AM    The patient was seen, examined and discussed with Reino Bellis, NP-C and I agree with the above.   50 year old male post NSTEMI on 11/16, s/p LHC that showed 1 v CAD tx with overlapping DES x 2 to LCx and POBA to OM2. Now chest pain free. Ischemic cardiomyopathy, LVEF 30-35%, on ASA, Brilinta, atorvastatin, no ACEI/ARB as acute on chronic kidney failure, we will continue imdur 30 mg po daily, change iv hydralazine to hydralazine PO 25 mg TID, we will d/c lasix, we will arrange for an early follow up in 1 week to uptitrate BP meds as needed and for CMP blood draw.  Discharge today. Follow up echocardiogram in 6 weeks. If persistent LVEF < 35% --> ICD referral.  Ena Dawley 12/26/2015

## 2015-12-27 ENCOUNTER — Other Ambulatory Visit: Payer: Self-pay | Admitting: *Deleted

## 2015-12-27 MED ORDER — AMBULATORY NON FORMULARY MEDICATION: 81.0000 mg | Freq: Every day | Status: DC

## 2015-12-27 MED ORDER — AMBULATORY NON FORMULARY MEDICATION: 90.0000 mg | Freq: Two times a day (BID) | Status: DC

## 2016-01-02 ENCOUNTER — Encounter: Payer: Self-pay | Admitting: Cardiology

## 2016-01-03 ENCOUNTER — Encounter: Payer: Self-pay | Admitting: Cardiology

## 2016-01-03 ENCOUNTER — Ambulatory Visit (INDEPENDENT_AMBULATORY_CARE_PROVIDER_SITE_OTHER): Payer: BC Managed Care – PPO | Admitting: Cardiology

## 2016-01-03 ENCOUNTER — Encounter: Payer: Self-pay | Admitting: *Deleted

## 2016-01-03 VITALS — BP 138/72 | HR 80 | Ht 65.0 in | Wt 131.0 lb

## 2016-01-03 DIAGNOSIS — I10 Essential (primary) hypertension: Secondary | ICD-10-CM

## 2016-01-03 DIAGNOSIS — E7800 Pure hypercholesterolemia, unspecified: Secondary | ICD-10-CM

## 2016-01-03 DIAGNOSIS — N189 Chronic kidney disease, unspecified: Secondary | ICD-10-CM

## 2016-01-03 DIAGNOSIS — I214 Non-ST elevation (NSTEMI) myocardial infarction: Secondary | ICD-10-CM

## 2016-01-03 DIAGNOSIS — Z955 Presence of coronary angioplasty implant and graft: Secondary | ICD-10-CM

## 2016-01-03 DIAGNOSIS — N179 Acute kidney failure, unspecified: Secondary | ICD-10-CM

## 2016-01-03 DIAGNOSIS — N289 Disorder of kidney and ureter, unspecified: Secondary | ICD-10-CM

## 2016-01-03 LAB — BASIC METABOLIC PANEL
BUN: 29 mg/dL — ABNORMAL HIGH (ref 7–25)
CHLORIDE: 102 mmol/L (ref 98–110)
CO2: 24 mmol/L (ref 20–31)
Calcium: 8.9 mg/dL (ref 8.6–10.3)
Creat: 1.61 mg/dL — ABNORMAL HIGH (ref 0.60–1.35)
Glucose, Bld: 123 mg/dL — ABNORMAL HIGH (ref 65–99)
POTASSIUM: 4.7 mmol/L (ref 3.5–5.3)
SODIUM: 135 mmol/L (ref 135–146)

## 2016-01-03 NOTE — Progress Notes (Signed)
01/03/2016 Jeffery Young   1965-08-16  [id number]  Primary Physician Andria Frames, MD Primary Cardiologist: Dr. Angelena Form    Reason for Visit/CC: Hospital follow-up for CAD, status post non-STEMI  HPI:  Jeffery Young a 50 y.o.malewith a history of hypertension, hyperlipidemia, diabetes type 2, ongoing tobacco abuse and strong family history of CAD (twin brother with MI)who presented to [facility] ED on 11/16 with ongoing chest pain 2 weeks.  He ruled in for non-ST elevation myocardial infarction with troponin peaking at 5. EKG showed inferior lateral T-wave inversions. He was started on IV heparin and taken to the cath lab for left heart catheterization which showed severe single-vessel disease in the left circumflex with 95% stenosis that was treated successfully with PCI utilizing a drug-eluting stent. He also underwent by POBA to the OM 2. He was placed on dual antiplatelet therapy with aspirin plus Brilinta (Twilight Study). He was continued on a statin and beta blocker. An echocardiogram was obtained which revealed reduced left ventricular systolic function with an estimated ejection fraction of 30-35% with akinesis of the inferior myocardium along with grade 2 diastolic dysfunction. He was noted to have a rise in  serum creatinine following cath. This was treated with gentle IV hydration. SCr day of d/c was 1.7. His blood pressure also remained poorly uncontrolled and hydralazine was added to his regimen which resulted in improvement. Given his MI with EF of less than 30-35%, he was fitted for a LifeVest.  He presents back to clinic today for post hospital follow-up. He has done well. No recurrent CP. No dyspnea, LEE, orthopnea or PND. He has been fully compliant with meds and his lifevest. No shocks. He has not required use of SL NTG.   Current Meds  Medication Sig  . AMBULATORY NON FORMULARY MEDICATION Take 90 mg by mouth 2 (two) times daily. Medication Name: Brilinta 90 mg  BID (TWILIGHT Research Study PROVIDED)  . AMBULATORY NON FORMULARY MEDICATION Take 81 mg by mouth daily. Medication Name: Aspirin 81 mg Daily (TWILIGHT Research study PROVIDED)  . atorvastatin (LIPITOR) 80 MG tablet Take 1 tablet (80 mg total) by mouth daily at 6 PM.  . carvedilol (COREG) 25 MG tablet Take 1 tablet (25 mg total) by mouth 2 (two) times daily with a meal.  . hydrALAZINE (APRESOLINE) 25 MG tablet Take 1 tablet (25 mg total) by mouth 3 (three) times daily.  Marland Kitchen ibuprofen (ADVIL,MOTRIN) 200 MG tablet Take 200 mg by mouth every 6 (six) hours as needed for moderate pain.  . isosorbide mononitrate (IMDUR) 30 MG 24 hr tablet Take 1 tablet (30 mg total) by mouth daily.  . metFORMIN (GLUCOPHAGE) 1000 MG tablet Take 1,000 mg by mouth 2 (two) times daily with a meal.  . nitroGLYCERIN (NITROSTAT) 0.4 MG SL tablet Place 1 tablet (0.4 mg total) under the tongue every 5 (five) minutes x 3 doses as needed for chest pain.   No Known Allergies Past Medical History:  Diagnosis Date  . Coronary artery disease    a.12/22/15: NSTEMI s/p overlapping DES x2 and balloon angioplasty to distal AV groove Circumflex (too small for a stent).   . Depression   . Hypercholesteremia   . Hypertension   . Prostate infection   . Tobacco abuse   . Type II diabetes mellitus (HCC)    Family History  Problem Relation Age of Onset  . CAD Brother   . CVA Brother    Past Surgical History:  Procedure Laterality Date  .  CARDIAC CATHETERIZATION N/A 12/22/2015   Procedure: Left Heart Cath and Coronary Angiography;  Surgeon: Burnell Blanks, MD;  Location: [place] [facility];  Service: Cardiovascular;  Laterality: N/A;  . CARDIAC CATHETERIZATION N/A 12/22/2015   Procedure: Coronary Stent Intervention;  Surgeon: Burnell Blanks, MD;  Location: [place] [facility];  Service: Cardiovascular;  Laterality: N/A;  . CORONARY ANGIOPLASTY WITH STENT PLACEMENT  12/22/2015   Social History   Social History  .  Marital status: Married    Spouse name: N/A  . Number of children: N/A  . Years of education: N/A   Occupational History  . Not on file.   Social History Main Topics  . Smoking status: Current Every Day Smoker    Packs/day: 0.75    Years: 29.00    Types: Cigarettes  . Smokeless tobacco: Never Used  . Alcohol use 4.2 oz/week    7 Cans of beer per week  . Drug use: No  . Sexual activity: Not Currently   Other Topics Concern  . Not on file   Social History Narrative  . No narrative on file     Review of Systems: General: negative for chills, fever, night sweats or weight changes.  Cardiovascular: negative for chest pain, dyspnea on exertion, edema, orthopnea, palpitations, paroxysmal nocturnal dyspnea or shortness of breath Dermatological: negative for rash Respiratory: negative for cough or wheezing Urologic: negative for hematuria Abdominal: negative for nausea, vomiting, diarrhea, bright red blood per rectum, melena, or hematemesis Neurologic: negative for visual changes, syncope, or dizziness All other systems reviewed and are otherwise negative except as noted above.   Physical Exam:  Height 5\' 5"  (1.651 m).  General appearance: alert, cooperative and no distress Neck: no carotid bruit and no JVD Lungs: clear to auscultation bilaterally Heart: regular rate and rhythm, S1, S2 normal, no murmur, click, rub or gallop Extremities: extremities normal, atraumatic, no cyanosis or edema Pulses: 2+ and symmetric Skin: Skin color, texture, turgor normal. No rashes or lesions Neurologic: Grossly normal  EKG not performed   ASSESSMENT AND PLAN:   1. CAD: s/p NSTEMI 12/22/15. LHC showed severe single vessel disease in the LCx with 95 % stenosis that was treated with a DES x1 and POBA to OM2. EF 30-35%. He has remained stable w/o recurrent angina. Continue DAPT with ASA + Brilinta, high intensity statin, BB and Imdur.   2. HTN: Controlled on current regimen.   3.  Cardiomyopathy/ Systolic HF: EF post MI reduced at 30-35%. He has a lifevest and reports full compliance. Volume status is stable. Continue medical therapy with BB, hydralazine + nitrate. No ACE/ARB at this time given renal insufficiency. We will check a repeat BMP today. If Scr improved, will add ACE/ARB at his next f/u visit. We will gradually titrate HF meds. Plan is to repeat 2D echo in 3 months. If EF remains <35%, he will need referral to EP for consideration for ICD.   4. Renal Insufficiency: Scr at time of discharge was 1.7. He had a bump in SCr post cath. No ACE/ARB. We will check a repeat BMP today to reassess.   5. HLD: LDL was 144 during admission. Continue high intensity Lipitor. Recheck FLP and HFTs in 6 weeks.   6. DM: Per PCP.   7.Tobacco Abuse: he continues to smoke. Complete smoking cessation advised.    PLAN  F/u in 2-3 weeks for repeat assessment/ mediatation titration for HF.   Lyda Jester PA-C 01/03/2016 11:18 AM

## 2016-01-03 NOTE — Patient Instructions (Addendum)
Medication Instructions:  Your physician recommends that you continue on your current medications as directed. Please refer to the Current Medication list given to you today.   Labwork: TODAY:  BMET  6 WEEKS:  FASTING LIPID & HEPATIC   Testing/Procedures: None ordered  Follow-Up: Your physician recommends that you schedule a follow-up appointment in: [place], PA-C   Any Other Special Instructions Will Be Listed Below (If Applicable).  Heart-Healthy Eating Plan Introduction Heart-healthy meal planning includes:  Limiting unhealthy fats.  Increasing healthy fats.  Making other small dietary changes. You may need to talk with your doctor or a diet specialist (dietitian) to create an eating plan that is right for you. What types of fat should I choose?  Choose healthy fats. These include olive oil and canola oil, flaxseeds, walnuts, almonds, and seeds.  Eat more omega-3 fats. These include salmon, mackerel, sardines, tuna, flaxseed oil, and ground flaxseeds. Try to eat fish at least twice each week.  Limit saturated fats.  Saturated fats are often found in animal products, such as meats, butter, and cream.  Plant sources of saturated fats include palm oil, palm kernel oil, and coconut oil.  Avoid foods with partially hydrogenated oils in them. These include stick margarine, some tub margarines, cookies, crackers, and other baked goods. These contain trans fats. What general guidelines do I need to follow?  Check food labels carefully. Identify foods with trans fats or high amounts of saturated fat.  Fill one half of your plate with vegetables and green salads. Eat 4-5 servings of vegetables per day. A serving of vegetables is:  1 cup of raw leafy vegetables.   cup of raw or cooked cut-up vegetables.   cup of vegetable juice.  Fill one fourth of your plate with whole grains. Look for the word "whole" as the first word in the  ingredient list.  Fill one fourth of your plate with lean protein foods.  Eat 4-5 servings of fruit per day. A serving of fruit is:  One medium whole fruit.   cup of dried fruit.   cup of fresh, frozen, or canned fruit.   cup of 100% fruit juice.  Eat more foods that contain soluble fiber. These include apples, broccoli, carrots, beans, peas, and barley. Try to get 20-30 g of fiber per day.  Eat more home-cooked food. Eat less restaurant, buffet, and fast food.  Limit or avoid alcohol.  Limit foods high in starch and sugar.  Avoid fried foods.  Avoid frying your food. Try baking, boiling, grilling, or broiling it instead. You can also reduce fat by:  Removing the skin from poultry.  Removing all visible fats from meats.  Skimming the fat off of stews, soups, and gravies before serving them.  Steaming vegetables in water or broth.  Lose weight if you are overweight.  Eat 4-5 servings of nuts, legumes, and seeds per week:  One serving of dried beans or legumes equals  cup after being cooked.  One serving of nuts equals 1 ounces.  One serving of seeds equals  ounce or one tablespoon.  You may need to keep track of how much salt or sodium you eat. This is especially true if you have high blood pressure. Talk with your doctor or dietitian to get more information. What foods can I eat? Grains  Breads, including [place], white, pita, wheat, raisin, rye, oatmeal, and [place]. Tortillas that are neither fried nor made with lard or trans  fat. Low-fat rolls, including hotdog and hamburger buns and English muffins. Biscuits. Muffins. Waffles. Pancakes. Light popcorn. Whole-grain cereals. Flatbread. Melba toast. Pretzels. Breadsticks. Rusks. Low-fat snacks. Low-fat crackers, including oyster, saltine, matzo, graham, animal, and rye. Rice and pasta, including brown rice and pastas that are made with whole wheat. Vegetables  All vegetables. Fruits  All fruits, but limit  coconut. Meats and Other Protein Sources  Lean, well-trimmed beef, veal, pork, and lamb. Chicken and [place] without skin. All fish and shellfish. Wild duck, rabbit, pheasant, and venison. Egg whites or low-cholesterol egg substitutes. Dried beans, peas, lentils, and tofu. Seeds and most nuts. Dairy  Low-fat or nonfat cheeses, including ricotta, string, and mozzarella. Skim or 1% milk that is liquid, powdered, or evaporated. Buttermilk that is made with low-fat milk. Nonfat or low-fat yogurt. Beverages  Mineral water. Diet carbonated beverages. Sweets and Desserts  Sherbets and fruit ices. Honey, jam, marmalade, jelly, and syrups. Meringues and gelatins. Pure sugar candy, such as hard candy, jelly beans, gumdrops, mints, marshmallows, and small amounts of dark chocolate. [employer] Eat all sweets and desserts in moderation. Fats and Oils  Nonhydrogenated (trans-free) margarines. Vegetable oils, including soybean, sesame, sunflower, olive, peanut, safflower, corn, canola, and cottonseed. Salad dressings or mayonnaise made with a vegetable oil. Limit added fats and oils that you use for cooking, baking, salads, and as spreads. Other  Cocoa powder. Coffee and tea. All seasonings and condiments. The items listed above may not be a complete list of recommended foods or beverages. Contact your dietitian for more options.  What foods are not recommended? Grains  Breads that are made with saturated or trans fats, oils, or whole milk. Croissants. Butter rolls. Cheese breads. Sweet rolls. Donuts. Buttered popcorn. Chow mein noodles. High-fat crackers, such as cheese or butter crackers. Meats and Other Protein Sources  Fatty meats, such as hotdogs, short ribs, sausage, spareribs, bacon, rib eye roast or steak, and mutton. High-fat deli meats, such as salami and bologna. Caviar. Domestic duck and goose. Organ meats, such as kidney, liver, sweetbreads, and heart. Dairy  Cream, sour cream, cream cheese,  and creamed cottage cheese. Whole-milk cheeses, including blue (bleu), [place] Jack, [place], [place], American, [place], Swiss, cheddar, [place], and [place]. Whole or 2% milk that is liquid, evaporated, or condensed. Whole buttermilk. Cream sauce or high-fat cheese sauce. Yogurt that is made from whole milk. Beverages  Regular sodas and juice drinks with added sugar. Sweets and Desserts  Frosting. Pudding. Cookies. Cakes other than angel food cake. Candy that has milk chocolate or white chocolate, hydrogenated fat, butter, coconut, or unknown ingredients. Buttered syrups. Full-fat ice cream or ice cream drinks. Fats and Oils  Gravy that has suet, meat fat, or shortening. Cocoa butter, hydrogenated oils, palm oil, coconut oil, palm kernel oil. These can often be found in baked products, candy, fried foods, nondairy creamers, and whipped toppings. Solid fats and shortenings, including bacon fat, salt pork, lard, and butter. Nondairy cream substitutes, such as coffee creamers and sour cream substitutes. Salad dressings that are made of unknown oils, cheese, or sour cream. The items listed above may not be a complete list of foods and beverages to avoid. Contact your dietitian for more information.  This information is not intended to replace advice given to you by your health care provider. Make sure you discuss any questions you have with your health care provider. Document Released: 07/24/2011 Document Revised: 06/30/2015 Document Reviewed: 07/16/2013  2017 Elsevier     If you need a  refill on your cardiac medications before your next appointment, please call your pharmacy.

## 2016-01-10 ENCOUNTER — Encounter (HOSPITAL_COMMUNITY): Payer: Self-pay | Admitting: Emergency Medicine

## 2016-01-10 ENCOUNTER — Emergency Department (HOSPITAL_COMMUNITY): Payer: BC Managed Care – PPO

## 2016-01-10 ENCOUNTER — Telehealth: Payer: Self-pay | Admitting: Cardiology

## 2016-01-10 ENCOUNTER — Inpatient Hospital Stay (HOSPITAL_COMMUNITY)
Admission: EM | Admit: 2016-01-10 | Discharge: 2016-01-12 | DRG: 246 | Disposition: A | Discharge status: Home / Self Care | Payer: BC Managed Care – PPO | Attending: Cardiovascular Disease | Admitting: Cardiovascular Disease

## 2016-01-10 DIAGNOSIS — R079 Chest pain, unspecified: Secondary | ICD-10-CM

## 2016-01-10 DIAGNOSIS — E871 Hypo-osmolality and hyponatremia: Secondary | ICD-10-CM | POA: Diagnosis present

## 2016-01-10 DIAGNOSIS — Z7984 Long term (current) use of oral hypoglycemic drugs: Secondary | ICD-10-CM

## 2016-01-10 DIAGNOSIS — N183 Chronic kidney disease, stage 3 unspecified: Secondary | ICD-10-CM | POA: Diagnosis present

## 2016-01-10 DIAGNOSIS — E7800 Pure hypercholesterolemia, unspecified: Secondary | ICD-10-CM | POA: Diagnosis present

## 2016-01-10 DIAGNOSIS — Z823 Family history of stroke: Secondary | ICD-10-CM

## 2016-01-10 DIAGNOSIS — I200 Unstable angina: Secondary | ICD-10-CM | POA: Diagnosis present

## 2016-01-10 DIAGNOSIS — R778 Other specified abnormalities of plasma proteins: Secondary | ICD-10-CM

## 2016-01-10 DIAGNOSIS — I25110 Atherosclerotic heart disease of native coronary artery with unstable angina pectoris: Secondary | ICD-10-CM | POA: Diagnosis not present

## 2016-01-10 DIAGNOSIS — I130 Hypertensive heart and chronic kidney disease with heart failure and stage 1 through stage 4 chronic kidney disease, or unspecified chronic kidney disease: Secondary | ICD-10-CM | POA: Diagnosis present

## 2016-01-10 DIAGNOSIS — E1121 Type 2 diabetes mellitus with diabetic nephropathy: Secondary | ICD-10-CM | POA: Diagnosis present

## 2016-01-10 DIAGNOSIS — I214 Non-ST elevation (NSTEMI) myocardial infarction: Secondary | ICD-10-CM | POA: Diagnosis present

## 2016-01-10 DIAGNOSIS — I2583 Coronary atherosclerosis due to lipid rich plaque: Secondary | ICD-10-CM | POA: Diagnosis present

## 2016-01-10 DIAGNOSIS — I10 Essential (primary) hypertension: Secondary | ICD-10-CM | POA: Diagnosis not present

## 2016-01-10 DIAGNOSIS — F17210 Nicotine dependence, cigarettes, uncomplicated: Secondary | ICD-10-CM | POA: Diagnosis present

## 2016-01-10 DIAGNOSIS — R7989 Other specified abnormal findings of blood chemistry: Secondary | ICD-10-CM

## 2016-01-10 DIAGNOSIS — I2510 Atherosclerotic heart disease of native coronary artery without angina pectoris: Secondary | ICD-10-CM

## 2016-01-10 DIAGNOSIS — Z8249 Family history of ischemic heart disease and other diseases of the circulatory system: Secondary | ICD-10-CM

## 2016-01-10 DIAGNOSIS — I5042 Chronic combined systolic (congestive) and diastolic (congestive) heart failure: Secondary | ICD-10-CM | POA: Diagnosis present

## 2016-01-10 DIAGNOSIS — Z720 Tobacco use: Secondary | ICD-10-CM | POA: Diagnosis present

## 2016-01-10 DIAGNOSIS — I255 Ischemic cardiomyopathy: Secondary | ICD-10-CM | POA: Diagnosis present

## 2016-01-10 DIAGNOSIS — E1122 Type 2 diabetes mellitus with diabetic chronic kidney disease: Secondary | ICD-10-CM | POA: Diagnosis present

## 2016-01-10 DIAGNOSIS — Z7982 Long term (current) use of aspirin: Secondary | ICD-10-CM

## 2016-01-10 DIAGNOSIS — I2582 Chronic total occlusion of coronary artery: Secondary | ICD-10-CM | POA: Diagnosis present

## 2016-01-10 DIAGNOSIS — Z955 Presence of coronary angioplasty implant and graft: Secondary | ICD-10-CM

## 2016-01-10 DIAGNOSIS — I110 Hypertensive heart disease with heart failure: Secondary | ICD-10-CM

## 2016-01-10 DIAGNOSIS — Z7902 Long term (current) use of antithrombotics/antiplatelets: Secondary | ICD-10-CM

## 2016-01-10 HISTORY — DX: Atherosclerotic heart disease of native coronary artery without angina pectoris: I25.10

## 2016-01-10 HISTORY — DX: Chest pain, unspecified: R07.9

## 2016-01-10 LAB — BASIC METABOLIC PANEL
Anion gap: 11 (ref 5–15)
BUN: 21 mg/dL — ABNORMAL HIGH (ref 6–20)
CHLORIDE: 101 mmol/L (ref 101–111)
CO2: 21 mmol/L — ABNORMAL LOW (ref 22–32)
CREATININE: 1.5 mg/dL — AB (ref 0.61–1.24)
Calcium: 9.1 mg/dL (ref 8.9–10.3)
GFR calc non Af Amer: 53 mL/min — ABNORMAL LOW (ref >60)
Glucose, Bld: 211 mg/dL — ABNORMAL HIGH (ref 65–99)
POTASSIUM: 4.4 mmol/L (ref 3.5–5.1)
SODIUM: 133 mmol/L — AB (ref 135–145)

## 2016-01-10 LAB — CBC
HCT: 42.5 % (ref 39.0–52.0)
Hemoglobin: 14.9 g/dL (ref 13.0–17.0)
MCH: 31.1 pg (ref 26.0–34.0)
MCHC: 35.1 g/dL (ref 30.0–36.0)
MCV: 88.7 fL (ref 78.0–100.0)
PLATELETS: 300 10*3/uL (ref 150–400)
RBC: 4.79 MIL/uL (ref 4.22–5.81)
RDW: 12.4 % (ref 11.5–15.5)
WBC: 9.8 10*3/uL (ref 4.0–10.5)

## 2016-01-10 LAB — I-STAT TROPONIN, ED
TROPONIN I, POC: 0.01 ng/mL (ref 0.00–0.08)
Troponin i, poc: 0.01 ng/mL (ref 0.00–0.08)

## 2016-01-10 LAB — CBG MONITORING, ED: GLUCOSE-CAPILLARY: 158 mg/dL — AB (ref 65–99)

## 2016-01-10 NOTE — ED Triage Notes (Signed)
Pt sts CP starting last night; pt wearing external defib; pt had recent stent as well

## 2016-01-10 NOTE — ED Notes (Signed)
Pt sent to xray then to room.

## 2016-01-10 NOTE — ED Provider Notes (Signed)
[place] DEPT Provider Note   CSN: [id number] Arrival date & time: 01/10/16  1750    History   Chief Complaint Chief Complaint  Patient presents with  . Chest Pain    HPI Jeffery Young is a 50 y.o. male.  Patient is a 50 y/o male with hx of CAD, HTN, HLD, tobacco abuse, and NSTEMI on 12/22/15 s/p DES x 2 and balloon angioplasty to distal AV groove Circumflex. He is on dual therapy of ASA and Brilinta for anticoagulation and has a defibrillator for LVEF of 30-35%. He presents to the ED for evaluation of chest pain. Chest pain began last night and has been intermittent. He describes an ache that radiates to his LUE and mildly to his back. Pain has been relieved with NTG prior to arrival. Patient reports taking 6 tablets before ED evaluation. He reports similar pain with his NSTEMI. He cannot characterize whether pain worsens with exertion. He has not had syncope, SOB, lightheadedness, jaw pain, extremity numbness/paresthesias, nausea, vomiting, fever, diaphoresis. He had reassuring outpatient cards follow up on 01/03/16.  Cards - Dr. Julianne Handler   The history is provided by the patient. No language interpreter was used.  Chest Pain      Past Medical History:  Diagnosis Date  . Coronary artery disease    a.12/22/15: NSTEMI s/p overlapping DES x2 and balloon angioplasty to distal AV groove Circumflex (too small for a stent).   . Depression   . Hypercholesteremia   . Hypertension   . Prostate infection   . Tobacco abuse   . Type II diabetes mellitus [facility])     Patient Active Problem List   Diagnosis Date Noted  . Chest pain 01/10/2016  . Status post coronary artery stent placement   . Acute kidney injury superimposed on CKD ([place])   . Type II diabetes mellitus ([place])   . Tobacco abuse   . Hypertension   . Hypercholesteremia   . NSTEMI (non-ST elevated myocardial infarction) ([place]) 12/22/2015  . Lightheadedness 08/22/2012  . Dehydration 08/22/2012    Past Surgical  History:  Procedure Laterality Date  . CARDIAC CATHETERIZATION N/A 12/22/2015   Procedure: Left Heart Cath and Coronary Angiography;  Surgeon: Burnell Blanks, MD;  Location: [place] [facility];  Service: Cardiovascular;  Laterality: N/A;  . CARDIAC CATHETERIZATION N/A 12/22/2015   Procedure: Coronary Stent Intervention;  Surgeon: Burnell Blanks, MD;  Location: [place] [facility];  Service: Cardiovascular;  Laterality: N/A;  . CORONARY ANGIOPLASTY WITH STENT PLACEMENT  12/22/2015       Home Medications    Prior to Admission medications   Medication Sig Start Date End Date Taking? Authorizing Provider  AMBULATORY NON FORMULARY MEDICATION Take 90 mg by mouth 2 (two) times daily. Medication Name: Brilinta 90 mg BID (TWILIGHT Research Study PROVIDED) 12/26/15  Yes Burnell Blanks, MD  AMBULATORY NON FORMULARY MEDICATION Take 81 mg by mouth daily. Medication Name: Aspirin 81 mg Daily (TWILIGHT Research study PROVIDED) 12/27/15  Yes Burnell Blanks, MD  atorvastatin (LIPITOR) 80 MG tablet Take 1 tablet (80 mg total) by mouth daily at 6 PM. 12/26/15  Yes Cheryln Manly, NP  carvedilol (COREG) 25 MG tablet Take 1 tablet (25 mg total) by mouth 2 (two) times daily with a meal. 12/26/15  Yes Cheryln Manly, NP  hydrALAZINE (APRESOLINE) 25 MG tablet Take 1 tablet (25 mg total) by mouth 3 (three) times daily. 12/26/15  Yes Cheryln Manly, NP  ibuprofen (ADVIL,MOTRIN) 200 MG tablet  Take 200 mg by mouth every 6 (six) hours as needed for moderate pain.   Yes Historical Provider, MD  isosorbide mononitrate (IMDUR) 30 MG 24 hr tablet Take 1 tablet (30 mg total) by mouth daily. 12/27/15  Yes Cheryln Manly, NP  metFORMIN (GLUCOPHAGE) 1000 MG tablet Take 1,000 mg by mouth 2 (two) times daily with a meal.   Yes Historical Provider, MD  nitroGLYCERIN (NITROSTAT) 0.4 MG SL tablet Place 1 tablet (0.4 mg total) under the tongue every 5 (five) minutes x 3 doses as needed for  chest pain. 12/26/15  Yes Cheryln Manly, NP    Family History Family History  Problem Relation Age of Onset  . CAD Brother   . CVA Brother     Social History Social History  Substance Use Topics  . Smoking status: Current Every Day Smoker    Packs/day: 0.75    Years: 29.00    Types: Cigarettes  . Smokeless tobacco: Never Used  . Alcohol use 4.2 oz/week    7 Cans of beer per week     Allergies   Patient has no known allergies.   Review of Systems Review of Systems  Cardiovascular: Positive for chest pain.   Ten systems reviewed and are negative for acute change, except as noted in the HPI.    Physical Exam Updated Vital Signs BP 139/83   Pulse 109   Temp 97.8 F (36.6 C) (Oral)   Resp 15   SpO2 97%   Physical Exam  Constitutional: He is oriented to person, place, and time. He appears well-developed and well-nourished. No distress.  Nontoxic and in NAD. In good spirits, pleasant.  HENT:  Head: Normocephalic and atraumatic.  Eyes: Conjunctivae and EOM are normal. No scleral icterus.  Neck: Normal range of motion.  No JVD  Cardiovascular: Regular rhythm and intact distal pulses.   Mild tachycardia  Pulmonary/Chest: Effort normal. No respiratory distress. He has no wheezes. He has no rales.  Respirations even and unlabored. Lungs CTAB.  Musculoskeletal: Normal range of motion.  Neurological: He is alert and oriented to person, place, and time. He exhibits normal muscle tone. Coordination normal.  Skin: Skin is warm and dry. No rash noted. He is not diaphoretic. No erythema. No pallor.  Psychiatric: He has a normal mood and affect. His behavior is normal.  Nursing note and vitals reviewed.    ED Treatments / Results  Labs (all labs ordered are listed, but only abnormal results are displayed) Labs Reviewed  BASIC METABOLIC PANEL - Abnormal; Notable for the following:       Result Value   Sodium 133 (*)    CO2 21 (*)    Glucose, Bld 211 (*)    BUN  21 (*)    Creatinine, Ser 1.50 (*)    GFR calc non Af Amer 53 (*)    All other components within normal limits  CBG MONITORING, ED - Abnormal; Notable for the following:    Glucose-Capillary 158 (*)    All other components within normal limits  CBC  I-STAT TROPOININ, ED  I-STAT TROPOININ, ED    EKG  EKG Interpretation  Date/Time:  Tuesday January 10 2016 17:56:23 EST Ventricular Rate:  115 PR Interval:  142 QRS Duration: 74 QT Interval:  324 QTC Calculation: 448 R Axis:   92 Text Interpretation:  Sinus tachycardia Rightward axis Septal infarct , age undetermined T wave abnormality, consider inferior ischemia Abnormal ECG Similar T wave changes to prior ECG  Confirmed by [facility] MD, [place] ([id number]) on 01/10/2016 6:03:11 PM       Radiology Dg Chest 2 View  Result Date: 01/10/2016 CLINICAL DATA:  Left-sided chest pain and left arm pain. EXAM: CHEST  2 VIEW COMPARISON:  None. FINDINGS: The heart size and mediastinal contours are within normal limits. Both lungs are clear. No pneumothorax. The visualized skeletal structures are unremarkable. IMPRESSION: No active cardiopulmonary disease. No significant change from prior. Electronically Signed   By: Ashley Royalty M.D.   On: 01/10/2016 20:22    Procedures Procedures (including critical care time)  Medications Ordered in ED Medications - No data to display   Initial Impression / Assessment and Plan / ED Course  I have reviewed the triage vital signs and the nursing notes.  Pertinent labs & imaging results that were available during my care of the patient were reviewed by me and considered in my medical decision making (see chart for details).  Clinical Course     50 year old male with recent PCI for an STEMI presents to the emergency department for persistent chest pain which began yesterday. He reports that his pain is similar to his past heart attack. Initial workup is reassuring. Patient is mildly tachycardic. He has had no  hypoxia. Blood pressure is relatively well-controlled compared to baseline during admission he is on hydralazine and Coreg for this. Case discussed with Dr. Radford Pax of cardiology who recommends observation admission to [facility] for chest pain rule out. Case subsequently discussed with Dr. Loleta Books of [facility] who will admit.   Final Clinical Impressions(s) / ED Diagnoses   Final diagnoses:  Chest pain, unspecified type    New Prescriptions New Prescriptions   No medications on file     Antonietta Breach, PA-C 01/10/16 [place], MD 01/12/16 [id number]

## 2016-01-10 NOTE — Telephone Encounter (Signed)
Pt states he has been having chest pain all day, has taken 4-5 NTG since 2 PM without relief.  He c/o pain in arm and chest pain not going away. He denies SOB, nausea, vomiting, and sweating.  I advised pt to go to ED if he had a ride or call 911. He voiced understanding and agreed with plan.

## 2016-01-10 NOTE — ED Notes (Signed)
Hospitalist at bedside 

## 2016-01-10 NOTE — Telephone Encounter (Signed)
Pt c/o of Chest Pain: 1. Are you having CP right now? No 2. Are you experiencing any other symptoms (ex. SOB, nausea, vomiting, sweating)? no 3. How long have you been experiencing CP? Last night 4. Is your CP continuous or coming and going? Comes and goes  5. Have you taken Nitroglycerin? Yes-4 to 5

## 2016-01-10 NOTE — ED Notes (Signed)
Patient transported to X-ray by [facility].

## 2016-01-10 NOTE — ED Notes (Signed)
Pt taken to xray 

## 2016-01-10 NOTE — H&P (Signed)
History and Physical  Patient Name: Jeffery Young     ZJQ:[id number]    DOB: [birth year]    DOA: 01/10/2016 PCP: Andria Frames, MD  Cardiology: Dr. Angelena Form    Patient coming from: Home     Chief Complaint: Chest pain  HPI: Jeffery Young is a 50 y.o. male with a past medical history significant for CAD s/p DES to LCx 12/22/15, HTN, CKD, and NIDDM who presents with chest pain.  The patient was recently admitted to the Cardiology service for NSTEMI, found to have single vessel disease, treated with DES x2 to LCx, started on aspirin and Brilinta.  Echo showed EF 30-35% and so he was given Lifevest as well.  Since discharge, he has been well until last night, he was lying down to sleep when he had onset of chest discomfort and left elbow discomfort, identical to his previous angina before his stent.  This resolved by itself, but returned today.  He took "like 6 nitro" without relief, called his Cardiologist's office who recommended he be evaluated in the ER.  ED course: -Afebrile, heart rate 110, respirations and pulse ox normal, blood pressure 161/108 -Initial ECG showed sinus tachycardia with a new right axis and troponin was negative. -Na 133, K 4.4, Cr 1.5 (baseline 1.6), WBC 9.8, Hgb 14.9 -CXR clear -The case was discussed with Cardiology, who recommended rule out overnight given recent stent and TRH was asked to admit for observation for chest pain      Review of Systems:  Review of Systems  Constitutional: Negative for diaphoresis.  Respiratory: Negative for shortness of breath.   Cardiovascular: Positive for chest pain.  Gastrointestinal: Negative for nausea and vomiting.  All other systems reviewed and are negative.    Past Medical History:  Diagnosis Date  . Coronary artery disease    a.12/22/15: NSTEMI s/p overlapping DES x2 and balloon angioplasty to distal AV groove Circumflex (too small for a stent).   . Depression   . Hypercholesteremia   . Hypertension   .  Prostate infection   . Tobacco abuse   . Type II diabetes mellitus ([place])     Past Surgical History:  Procedure Laterality Date  . CARDIAC CATHETERIZATION N/A 12/22/2015   Procedure: Left Heart Cath and Coronary Angiography;  Surgeon: Burnell Blanks, MD;  Location: [facility];  Service: Cardiovascular;  Laterality: N/A;  . CARDIAC CATHETERIZATION N/A 12/22/2015   Procedure: Coronary Stent Intervention;  Surgeon: Burnell Blanks, MD;  Location: [facility];  Service: Cardiovascular;  Laterality: N/A;  . CORONARY ANGIOPLASTY WITH STENT PLACEMENT  12/22/2015    Social History: Patient lives alone.  Patient walks unassisted.  He still smokes.  He is from [place].  He is a retired Occupational hygienist.    No Known Allergies  Family history: family history includes CAD in his brother and brother; CVA in his brother.  Prior to Admission medications   Medication Sig Start Date End Date Taking? Authorizing Provider  AMBULATORY NON FORMULARY MEDICATION Take 90 mg by mouth 2 (two) times daily. Medication Name: Brilinta 90 mg BID (TWILIGHT Research Study PROVIDED) 12/26/15  Yes Burnell Blanks, MD  AMBULATORY NON FORMULARY MEDICATION Take 81 mg by mouth daily. Medication Name: Aspirin 81 mg Daily (TWILIGHT Research study PROVIDED) 12/27/15  Yes Burnell Blanks, MD  atorvastatin (LIPITOR) 80 MG tablet Take 1 tablet (80 mg total) by mouth daily at 6 PM. 12/26/15  Yes Cheryln Manly, NP  carvedilol (COREG)  25 MG tablet Take 1 tablet (25 mg total) by mouth 2 (two) times daily with a meal. 12/26/15  Yes Cheryln Manly, NP  hydrALAZINE (APRESOLINE) 25 MG tablet Take 1 tablet (25 mg total) by mouth 3 (three) times daily. 12/26/15  Yes Cheryln Manly, NP  ibuprofen (ADVIL,MOTRIN) 200 MG tablet Take 200 mg by mouth every 6 (six) hours as needed for moderate pain.   Yes Historical Provider, MD  isosorbide mononitrate (IMDUR) 30 MG 24 hr tablet Take 1  tablet (30 mg total) by mouth daily. 12/27/15  Yes Cheryln Manly, NP  metFORMIN (GLUCOPHAGE) 1000 MG tablet Take 1,000 mg by mouth 2 (two) times daily with a meal.   Yes Historical Provider, MD  nitroGLYCERIN (NITROSTAT) 0.4 MG SL tablet Place 1 tablet (0.4 mg total) under the tongue every 5 (five) minutes x 3 doses as needed for chest pain. 12/26/15  Yes Cheryln Manly, NP       Physical Exam: BP 144/98   Pulse 106   Temp 97.8 F (36.6 C) (Oral)   Resp (!) 27   SpO2 97%  General appearance: Well-developed, adult male, alert and in no acute distress.   Eyes: Left pupil deformed.  Anicteric, conjunctiva pink, lids and lashes normal.     ENT: No nasal deformity, discharge, or epistaxis.  OP moist without lesions.   Skin: Warm and dry.   Cardiac: RRR, nl S1-S2, no murmurs appreciated.  Capillary refill is brisk.  JVP normal.  No LE edema.  Radial and DP pulses 2+ and symmetric.  No carotid bruits. Respiratory: Normal respiratory rate and rhythm.  CTAB without rales or wheezes. GI: Abdomen soft without rigidity.  No TTP. No ascites, distension.   MSK: No deformities or effusions.   Pain not reproduced with palpation of precordium.  No pain with arm movement. Neuro: Sensorium intact and responding to questions, attention normal.  Speech is fluent.  Moves all extremities equally and with normal coordination.    Psych: Behavior appropriate.  Affect normal.  No evidence of aural or visual hallucinations or delusions.       Labs on Admission:  The metabolic panel shows mild hyponatremia, chronic renal insufficiency. The complete blood count shows normal counts. The initial troponin is negative.  Radiological Exams on Admission: Personally reviewed CXR shows no focal abnormality: Dg Chest 2 View  Result Date: 01/10/2016 CLINICAL DATA:  Left-sided chest pain and left arm pain. EXAM: CHEST  2 VIEW COMPARISON:  None. FINDINGS: The heart size and mediastinal contours are within normal  limits. Both lungs are clear. No pneumothorax. The visualized skeletal structures are unremarkable. IMPRESSION: No active cardiopulmonary disease. No significant change from prior. Electronically Signed   By: Ashley Royalty M.D.   On: 01/10/2016 20:22    EKG: Independently reviewed. Rate 115, QTc 448, new right axis.      Assessment/Plan  1. Chest pain: This is new.  The pain is identical to his recent MI and other potential causes of chest pain (PE, dissection, pancreatitis, pneumonia/effusion, pericarditis) are thought to be unlikely.  We have been asked to admit the patient for observation and etiology consultation with Cardiology tomorrow.  -Aspirin 325 given in ER -Serial troponins are ordered -Telemetry -Consult to cardiology, appreciate recommendations -Smoking cessation was recommended, nursing teaching ordered   2. Coronary secondary prevention:  -Continue statin, aspirin, Brilinta -Continue BB  3. HTN:  -Continue hydralazine, Imdur  4. NIDDM:  -Hold metformin -SSI every 8 hours while NPO  5. Hyponatremia:  -Check urine electrolytes, osmolality      DVT prophylaxis: Lovenox Diet: NPO after 4am for anticipated stress testing Code Status: FULL  Family Communication: Wife and son at bedside  Disposition Plan: Anticipate overnight observation for arrhythmia on telemetry, serial troponins and subsequent risk stratification by Cardiology.  If testing negative, home after. Consults called: Cardiology Admission status: Telemetry, OBS   Medical decision making: Patient seen at 10:20 PM on 01/10/2016.  The patient was discussed with Antonietta Breach, PA-C. What exists of the patient's chart was reviewed in depth.  Clinical condition: stable.      Edwin Dada [facility] Pager [phone]

## 2016-01-11 ENCOUNTER — Encounter (HOSPITAL_COMMUNITY): Payer: Self-pay | Admitting: Physician Assistant

## 2016-01-11 ENCOUNTER — Encounter (HOSPITAL_COMMUNITY)
Admission: EM | Disposition: A | Discharge status: Home / Self Care | Payer: Self-pay | Source: Home / Self Care | Attending: Cardiovascular Disease

## 2016-01-11 DIAGNOSIS — Z7902 Long term (current) use of antithrombotics/antiplatelets: Secondary | ICD-10-CM | POA: Diagnosis not present

## 2016-01-11 DIAGNOSIS — I110 Hypertensive heart disease with heart failure: Secondary | ICD-10-CM | POA: Diagnosis not present

## 2016-01-11 DIAGNOSIS — Z7982 Long term (current) use of aspirin: Secondary | ICD-10-CM | POA: Diagnosis not present

## 2016-01-11 DIAGNOSIS — I2510 Atherosclerotic heart disease of native coronary artery without angina pectoris: Secondary | ICD-10-CM

## 2016-01-11 DIAGNOSIS — I5042 Chronic combined systolic (congestive) and diastolic (congestive) heart failure: Secondary | ICD-10-CM

## 2016-01-11 DIAGNOSIS — E1122 Type 2 diabetes mellitus with diabetic chronic kidney disease: Secondary | ICD-10-CM | POA: Diagnosis present

## 2016-01-11 DIAGNOSIS — Z823 Family history of stroke: Secondary | ICD-10-CM | POA: Diagnosis not present

## 2016-01-11 DIAGNOSIS — Z955 Presence of coronary angioplasty implant and graft: Secondary | ICD-10-CM | POA: Diagnosis not present

## 2016-01-11 DIAGNOSIS — Z720 Tobacco use: Secondary | ICD-10-CM | POA: Diagnosis not present

## 2016-01-11 DIAGNOSIS — I200 Unstable angina: Secondary | ICD-10-CM

## 2016-01-11 DIAGNOSIS — I2582 Chronic total occlusion of coronary artery: Secondary | ICD-10-CM | POA: Diagnosis present

## 2016-01-11 DIAGNOSIS — F17210 Nicotine dependence, cigarettes, uncomplicated: Secondary | ICD-10-CM | POA: Diagnosis present

## 2016-01-11 DIAGNOSIS — I130 Hypertensive heart and chronic kidney disease with heart failure and stage 1 through stage 4 chronic kidney disease, or unspecified chronic kidney disease: Secondary | ICD-10-CM | POA: Diagnosis present

## 2016-01-11 DIAGNOSIS — I214 Non-ST elevation (NSTEMI) myocardial infarction: Secondary | ICD-10-CM | POA: Diagnosis present

## 2016-01-11 DIAGNOSIS — E871 Hypo-osmolality and hyponatremia: Secondary | ICD-10-CM | POA: Diagnosis present

## 2016-01-11 DIAGNOSIS — N183 Chronic kidney disease, stage 3 (moderate): Secondary | ICD-10-CM | POA: Diagnosis present

## 2016-01-11 DIAGNOSIS — Z8249 Family history of ischemic heart disease and other diseases of the circulatory system: Secondary | ICD-10-CM | POA: Diagnosis not present

## 2016-01-11 DIAGNOSIS — E1121 Type 2 diabetes mellitus with diabetic nephropathy: Secondary | ICD-10-CM | POA: Diagnosis present

## 2016-01-11 DIAGNOSIS — R7989 Other specified abnormal findings of blood chemistry: Secondary | ICD-10-CM

## 2016-01-11 DIAGNOSIS — I2583 Coronary atherosclerosis due to lipid rich plaque: Secondary | ICD-10-CM

## 2016-01-11 DIAGNOSIS — R778 Other specified abnormalities of plasma proteins: Secondary | ICD-10-CM

## 2016-01-11 DIAGNOSIS — I255 Ischemic cardiomyopathy: Secondary | ICD-10-CM | POA: Diagnosis present

## 2016-01-11 DIAGNOSIS — Z7984 Long term (current) use of oral hypoglycemic drugs: Secondary | ICD-10-CM | POA: Diagnosis not present

## 2016-01-11 DIAGNOSIS — I25110 Atherosclerotic heart disease of native coronary artery with unstable angina pectoris: Secondary | ICD-10-CM | POA: Diagnosis present

## 2016-01-11 DIAGNOSIS — R079 Chest pain, unspecified: Secondary | ICD-10-CM | POA: Diagnosis present

## 2016-01-11 DIAGNOSIS — E7800 Pure hypercholesterolemia, unspecified: Secondary | ICD-10-CM | POA: Diagnosis present

## 2016-01-11 DIAGNOSIS — R748 Abnormal levels of other serum enzymes: Secondary | ICD-10-CM

## 2016-01-11 HISTORY — DX: Unstable angina: I20.0

## 2016-01-11 HISTORY — PX: CARDIAC CATHETERIZATION: SHX172

## 2016-01-11 LAB — TROPONIN I
TROPONIN I: 0.04 ng/mL — AB (ref <0.03)
Troponin I: <0.03 ng/mL (ref <0.03)

## 2016-01-11 LAB — BASIC METABOLIC PANEL
Anion gap: 4 — ABNORMAL LOW (ref 5–15)
BUN: 24 mg/dL — AB (ref 6–20)
CHLORIDE: 105 mmol/L (ref 101–111)
CO2: 28 mmol/L (ref 22–32)
CREATININE: 1.5 mg/dL — AB (ref 0.61–1.24)
Calcium: 8.8 mg/dL — ABNORMAL LOW (ref 8.9–10.3)
GFR calc non Af Amer: 53 mL/min — ABNORMAL LOW (ref >60)
GLUCOSE: 164 mg/dL — AB (ref 65–99)
Potassium: 4.1 mmol/L (ref 3.5–5.1)
Sodium: 137 mmol/L (ref 135–145)

## 2016-01-11 LAB — GLUCOSE, CAPILLARY
GLUCOSE-CAPILLARY: 121 mg/dL — AB (ref 65–99)
GLUCOSE-CAPILLARY: 371 mg/dL — AB (ref 65–99)
Glucose-Capillary: 121 mg/dL — ABNORMAL HIGH (ref 65–99)
Glucose-Capillary: 171 mg/dL — ABNORMAL HIGH (ref 65–99)
Glucose-Capillary: 172 mg/dL — ABNORMAL HIGH (ref 65–99)
Glucose-Capillary: 288 mg/dL — ABNORMAL HIGH (ref 65–99)

## 2016-01-11 LAB — OSMOLALITY, URINE: OSMOLALITY UR: 407 mosm/kg (ref 300–900)

## 2016-01-11 LAB — PROTIME-INR
INR: 1.06
Prothrombin Time: 13.8 seconds (ref 11.4–15.2)

## 2016-01-11 LAB — RAPID URINE DRUG SCREEN, HOSP PERFORMED
Amphetamines: NOT DETECTED
BARBITURATES: NOT DETECTED
Benzodiazepines: NOT DETECTED
Cocaine: NOT DETECTED
OPIATES: NOT DETECTED
TETRAHYDROCANNABINOL: NOT DETECTED

## 2016-01-11 LAB — POCT ACTIVATED CLOTTING TIME
Activated Clotting Time: 219 seconds
Activated Clotting Time: 257 seconds

## 2016-01-11 LAB — SODIUM, URINE, RANDOM: SODIUM UR: 50 mmol/L

## 2016-01-11 LAB — OSMOLALITY: OSMOLALITY: 298 mosm/kg — AB (ref 275–295)

## 2016-01-11 SURGERY — LEFT HEART CATH AND CORONARY ANGIOGRAPHY

## 2016-01-11 MED ORDER — CARVEDILOL 12.5 MG PO TABS
25.0000 mg | ORAL_TABLET | Freq: Two times a day (BID) | ORAL | Status: DC
  Administered 2016-01-11 – 2016-01-12 (×3): 25 mg via ORAL
  Filled 2016-01-11 (×2): qty 2
  Filled 2016-01-11: qty 1

## 2016-01-11 MED ORDER — VERAPAMIL HCL 2.5 MG/ML IV SOLN
INTRAVENOUS | Status: AC
  Filled 2016-01-11: qty 2

## 2016-01-11 MED ORDER — ENOXAPARIN SODIUM 40 MG/0.4ML ~~LOC~~ SOLN
40.0000 mg | Freq: Every day | SUBCUTANEOUS | Status: DC
  Administered 2016-01-11: 40 mg via SUBCUTANEOUS
  Filled 2016-01-11 (×2): qty 0.4

## 2016-01-11 MED ORDER — METFORMIN HCL 500 MG PO TABS: 1000.0000 mg | ORAL_TABLET | Freq: Two times a day (BID) | ORAL | Status: DC

## 2016-01-11 MED ORDER — ASPIRIN 81 MG PO CHEW
324.0000 mg | CHEWABLE_TABLET | ORAL | Status: AC
  Administered 2016-01-11: 324 mg via ORAL
  Filled 2016-01-11: qty 4

## 2016-01-11 MED ORDER — FENTANYL CITRATE (PF) 100 MCG/2ML IJ SOLN
INTRAMUSCULAR | Status: DC | PRN
  Administered 2016-01-11 (×2): 25 ug via INTRAVENOUS

## 2016-01-11 MED ORDER — INSULIN ASPART 100 UNIT/ML ~~LOC~~ SOLN
0.0000 [IU] | SUBCUTANEOUS | Status: DC
  Administered 2016-01-11: 2 [IU] via SUBCUTANEOUS
  Administered 2016-01-11: 9 [IU] via SUBCUTANEOUS
  Administered 2016-01-12: 1 [IU] via SUBCUTANEOUS

## 2016-01-11 MED ORDER — ISOSORBIDE MONONITRATE ER 30 MG PO TB24
30.0000 mg | ORAL_TABLET | Freq: Every day | ORAL | Status: DC
  Administered 2016-01-11: 30 mg via ORAL
  Filled 2016-01-11: qty 1

## 2016-01-11 MED ORDER — ACETAMINOPHEN 325 MG PO TABS
650.0000 mg | ORAL_TABLET | ORAL | Status: DC | PRN
Start: 2016-01-11 — End: 2016-01-12

## 2016-01-11 MED ORDER — SODIUM CHLORIDE 0.9 % WEIGHT BASED INFUSION
3.0000 mL/kg/h | INTRAVENOUS | Status: AC
  Administered 2016-01-11: 3 mL/kg/h via INTRAVENOUS

## 2016-01-11 MED ORDER — HEPARIN (PORCINE) IN NACL 2-0.9 UNIT/ML-% IJ SOLN
INTRAMUSCULAR | Status: AC
  Filled 2016-01-11: qty 1000

## 2016-01-11 MED ORDER — HEPARIN SODIUM (PORCINE) 1000 UNIT/ML IJ SOLN
INTRAMUSCULAR | Status: AC
  Filled 2016-01-11: qty 1

## 2016-01-11 MED ORDER — TICAGRELOR 90 MG PO TABS
ORAL_TABLET | ORAL | Status: AC
  Filled 2016-01-11: qty 1

## 2016-01-11 MED ORDER — SODIUM CHLORIDE 0.9% FLUSH: 3.0000 mL | INTRAVENOUS | Status: DC | PRN

## 2016-01-11 MED ORDER — IOPAMIDOL (ISOVUE-370) INJECTION 76%
INTRAVENOUS | Status: AC
  Filled 2016-01-11: qty 50

## 2016-01-11 MED ORDER — ADENOSINE (DIAGNOSTIC) 140MCG/KG/MIN
INTRAVENOUS | Status: DC | PRN
  Administered 2016-01-11: 140 ug/kg/min via INTRAVENOUS

## 2016-01-11 MED ORDER — IOPAMIDOL (ISOVUE-370) INJECTION 76%
INTRAVENOUS | Status: DC | PRN
  Administered 2016-01-11: 140 mL via INTRA_ARTERIAL

## 2016-01-11 MED ORDER — SODIUM CHLORIDE 0.9 % WEIGHT BASED INFUSION
1.0000 mL/kg/h | INTRAVENOUS | Status: AC
  Administered 2016-01-11: 1 mL/kg/h via INTRAVENOUS

## 2016-01-11 MED ORDER — HYDRALAZINE HCL 20 MG/ML IJ SOLN: 5.0000 mg | INTRAMUSCULAR | Status: AC | PRN

## 2016-01-11 MED ORDER — ONDANSETRON HCL 4 MG/2ML IJ SOLN: 4.0000 mg | Freq: Four times a day (QID) | INTRAMUSCULAR | Status: DC | PRN

## 2016-01-11 MED ORDER — TICAGRELOR 90 MG PO TABS
90.0000 mg | ORAL_TABLET | Freq: Two times a day (BID) | ORAL | Status: DC
  Filled 2016-01-11: qty 1

## 2016-01-11 MED ORDER — FENTANYL CITRATE (PF) 100 MCG/2ML IJ SOLN
INTRAMUSCULAR | Status: AC
  Filled 2016-01-11: qty 2

## 2016-01-11 MED ORDER — LABETALOL HCL 5 MG/ML IV SOLN: 10.0000 mg | INTRAVENOUS | Status: AC | PRN

## 2016-01-11 MED ORDER — SODIUM CHLORIDE 0.9 % WEIGHT BASED INFUSION: 1.0000 mL/kg/h | INTRAVENOUS | Status: DC

## 2016-01-11 MED ORDER — LIDOCAINE HCL (PF) 1 % IJ SOLN
INTRAMUSCULAR | Status: AC
  Filled 2016-01-11: qty 30

## 2016-01-11 MED ORDER — ISOSORBIDE MONONITRATE ER 30 MG PO TB24
30.0000 mg | ORAL_TABLET | Freq: Once | ORAL | Status: AC
  Administered 2016-01-11: 30 mg via ORAL

## 2016-01-11 MED ORDER — HYDRALAZINE HCL 25 MG PO TABS
25.0000 mg | ORAL_TABLET | Freq: Three times a day (TID) | ORAL | Status: DC
  Administered 2016-01-11 (×2): 25 mg via ORAL
  Filled 2016-01-11 (×2): qty 1

## 2016-01-11 MED ORDER — NITROGLYCERIN 1 MG/10 ML FOR IR/CATH LAB
INTRA_ARTERIAL | Status: DC | PRN
  Administered 2016-01-11: 150 ug via INTRACORONARY

## 2016-01-11 MED ORDER — GI COCKTAIL ~~LOC~~: 30.0000 mL | Freq: Four times a day (QID) | ORAL | Status: DC | PRN

## 2016-01-11 MED ORDER — ASPIRIN 81 MG PO CHEW
81.0000 mg | CHEWABLE_TABLET | Freq: Every day | ORAL | Status: DC
  Administered 2016-01-12: 81 mg via ORAL
  Filled 2016-01-11: qty 1

## 2016-01-11 MED ORDER — MIDAZOLAM HCL 2 MG/2ML IJ SOLN
INTRAMUSCULAR | Status: AC
  Filled 2016-01-11: qty 2

## 2016-01-11 MED ORDER — SODIUM CHLORIDE 0.9% FLUSH: 3.0000 mL | Freq: Two times a day (BID) | INTRAVENOUS | Status: DC

## 2016-01-11 MED ORDER — SODIUM CHLORIDE 0.9 % IV SOLN: 250.0000 mL | INTRAVENOUS | Status: DC | PRN

## 2016-01-11 MED ORDER — INSULIN ASPART 100 UNIT/ML ~~LOC~~ SOLN
0.0000 [IU] | Freq: Three times a day (TID) | SUBCUTANEOUS | Status: DC
  Administered 2016-01-11: 5 [IU] via SUBCUTANEOUS
  Administered 2016-01-11: 2 [IU] via SUBCUTANEOUS

## 2016-01-11 MED ORDER — TICAGRELOR 90 MG PO TABS
ORAL_TABLET | ORAL | Status: DC | PRN
  Administered 2016-01-11: 90 mg via ORAL

## 2016-01-11 MED ORDER — NON FORMULARY: Freq: Every day | Status: DC

## 2016-01-11 MED ORDER — VERAPAMIL HCL 2.5 MG/ML IV SOLN
INTRAVENOUS | Status: DC | PRN
  Administered 2016-01-11: 16:00:00 via INTRA_ARTERIAL

## 2016-01-11 MED ORDER — IOPAMIDOL (ISOVUE-370) INJECTION 76%
INTRAVENOUS | Status: AC
  Filled 2016-01-11: qty 100

## 2016-01-11 MED ORDER — ADENOSINE 12 MG/4ML IV SOLN
INTRAVENOUS | Status: AC
  Filled 2016-01-11: qty 16

## 2016-01-11 MED ORDER — ISOSORBIDE MONONITRATE ER 60 MG PO TB24
60.0000 mg | ORAL_TABLET | Freq: Every day | ORAL | Status: DC
  Administered 2016-01-12: 60 mg via ORAL
  Filled 2016-01-11: qty 1

## 2016-01-11 MED ORDER — MIDAZOLAM HCL 2 MG/2ML IJ SOLN
INTRAMUSCULAR | Status: DC | PRN
  Administered 2016-01-11: 1 mg via INTRAVENOUS
  Administered 2016-01-11: 2 mg via INTRAVENOUS

## 2016-01-11 MED ORDER — LIDOCAINE HCL (PF) 1 % IJ SOLN
INTRAMUSCULAR | Status: DC | PRN
  Administered 2016-01-11: 5 mL

## 2016-01-11 MED ORDER — HYDRALAZINE HCL 50 MG PO TABS
50.0000 mg | ORAL_TABLET | Freq: Three times a day (TID) | ORAL | Status: DC
  Administered 2016-01-11 – 2016-01-12 (×2): 50 mg via ORAL
  Filled 2016-01-11 (×2): qty 1

## 2016-01-11 MED ORDER — ATORVASTATIN CALCIUM 80 MG PO TABS
80.0000 mg | ORAL_TABLET | Freq: Every day | ORAL | Status: DC
  Administered 2016-01-11 (×2): 80 mg via ORAL
  Filled 2016-01-11 (×2): qty 1

## 2016-01-11 MED ORDER — HEPARIN SODIUM (PORCINE) 1000 UNIT/ML IJ SOLN
INTRAMUSCULAR | Status: DC | PRN
  Administered 2016-01-11: 3000 [IU] via INTRAVENOUS
  Administered 2016-01-11: 5000 [IU] via INTRAVENOUS

## 2016-01-11 MED ORDER — HEPARIN (PORCINE) IN NACL 2-0.9 UNIT/ML-% IJ SOLN
INTRAMUSCULAR | Status: DC | PRN
  Administered 2016-01-11: 1000 mL

## 2016-01-11 MED ORDER — NON FORMULARY
Freq: Two times a day (BID) | Status: DC
  Administered 2016-01-11: 22:00:00 via ORAL

## 2016-01-11 MED ORDER — ASPIRIN EC 325 MG PO TBEC
325.0000 mg | DELAYED_RELEASE_TABLET | Freq: Once | ORAL | Status: AC
  Administered 2016-01-11: 325 mg via ORAL
  Filled 2016-01-11: qty 1

## 2016-01-11 SURGICAL SUPPLY — 18 items
BALLN EUPHORA RX 2.0X10 (BALLOONS) ×3
BALLN ~~LOC~~ EUPHORA RX 2.5X12 (BALLOONS) ×3
BALLOON EUPHORA RX 2.0X10 (BALLOONS) ×1 IMPLANT
BALLOON ~~LOC~~ EUPHORA RX 2.5X12 (BALLOONS) ×1 IMPLANT
CATH 5FR JL3.5 JR4 ANG PIG MP (CATHETERS) ×2 IMPLANT
CATH VISTA GUIDE 6FR XB4 (CATHETERS) ×3 IMPLANT
DEVICE RAD COMP TR BAND LRG (VASCULAR PRODUCTS) ×3 IMPLANT
GLIDESHEATH SLEND SS 6F .021 (SHEATH) ×3 IMPLANT
GUIDEWIRE INQWIRE 1.5J.035X260 (WIRE) ×1 IMPLANT
GUIDEWIRE PRESSURE COMET II (WIRE) ×3 IMPLANT
INQWIRE 1.5J .035X260CM (WIRE) ×3
KIT ENCORE 26 ADVANTAGE (KITS) ×3 IMPLANT
KIT HEART LEFT (KITS) ×3 IMPLANT
PACK CARDIAC CATHETERIZATION (CUSTOM PROCEDURE TRAY) ×3 IMPLANT
STENT RESOLUTE ONYX 2.25X15 (Permanent Stent) ×3 IMPLANT
TRANSDUCER W/STOPCOCK (MISCELLANEOUS) ×3 IMPLANT
TUBING CIL FLEX 10 FLL-RA (TUBING) ×3 IMPLANT
WIRE COUGAR XT STRL 190CM (WIRE) ×3 IMPLANT

## 2016-01-11 NOTE — Progress Notes (Signed)
Patient troponin resulted 0.04 this morning.Patient denies chest pain or discomfort at present time.Text paged NP K.Government social research officer.

## 2016-01-11 NOTE — Care Management Obs Status (Signed)
[facility] NOTIFICATION   Patient Details  Name: Birt Reinoso MRN: [id number] Date of Birth: 1965-10-08   Medicare Observation Status Notification Given:  Yes    CrutchfieldAntony Haste, RN 01/11/2016, 10:46 AM

## 2016-01-11 NOTE — Progress Notes (Signed)
Patient asked to have family member to bring study drug in to hospital.

## 2016-01-11 NOTE — Interval H&P Note (Signed)
Cath Lab Visit (complete for each Cath Lab visit)  Clinical Evaluation Leading to the Procedure:   ACS: Yes.    Non-ACS:    Anginal Classification: CCS III  Anti-ischemic medical therapy: Maximal Therapy (2 or more classes of medications)  Non-Invasive Test Results: No non-invasive testing performed  Prior CABG: No previous CABG  History and Physical Interval Note:  01/11/2016 3:41 PM  Jeffery Young  has presented today for surgery, with the diagnosis of cp  The various methods of treatment have been discussed with the patient and family. After consideration of risks, benefits and other options for treatment, the patient has consented to  Procedure(s): Left Heart Cath and Coronary Angiography (N/A) as a surgical intervention .  The patient's history has been reviewed, patient examined, no change in status, stable for surgery.  I have reviewed the patient's chart and labs.  Questions were answered to the patient's satisfaction.     Sherren Mocha

## 2016-01-11 NOTE — H&P (View-Only) (Signed)
CARDIOLOGY CONSULT NOTE   Patient ID: Jeffery Young MRN: [id number] DOB/AGE: 49-May-1967 49 y.o.  Admit date: 01/10/2016  Primary Physician   Andria Frames, MD Primary Cardiologist   Dr Angelena Form Reason for Consultation   Chest pain Requesting MD: Dr Eliseo Squires  [name] is a 50 y.o. year old male with a history of HTN, HLD, CKD III, FH CAD, and NIDDM .  Admit 11/16-11/20 for NSTEMI w/ DES x 2 CFX and POBA OM2. In [place] study. D/c w/ LifeVest for ICM w/ EF 30% 11/28, seen in f/u and was doing well. BMET was stable   Monday night, pt at home, not doing much and developed chest pain. It was a pulling pain. It was 8/10. He took ibuprofen x 3 and was able to sleep. Pt was mid=chest>L chest and L arm.   He woke with the chest pain on Tuesday, but it was not as bad. Later, the pain worsened and he started taking SL NTG. Took about 5 of them. The nitro would help temporarily. No change in pain w/ deep inspiration or cough. No change with position change. No associated sx.   It reminded him of his MI/anginal pain. He finally called the office and came to the ER as requested. He did not get anything for pain, but the pain finally went away. This was the first episode of pain since d/c.    Past Medical History:  Diagnosis Date  . Coronary artery disease    a.12/22/15: NSTEMI s/p overlapping DES x2 and balloon angioplasty to distal AV groove Circumflex (too small for a stent).   . Depression   . Hypercholesteremia   . Hypertension   . Prostate infection   . Tobacco abuse   . Type II diabetes mellitus ([place])      Past Surgical History:  Procedure Laterality Date  . CARDIAC CATHETERIZATION N/A 12/22/2015   Procedure: Left Heart Cath and Coronary Angiography;  Surgeon: Burnell Blanks, MD;  Location: [facility];  Service: Cardiovascular;  Laterality: N/A;  . CARDIAC CATHETERIZATION N/A 12/22/2015   Procedure: Coronary Stent Intervention;  Surgeon: Burnell Blanks, MD;  Location: [facility];  Service: Cardiovascular;  Laterality: N/A;  . CORONARY ANGIOPLASTY WITH STENT PLACEMENT  12/22/2015    No Known Allergies  I have reviewed the patient's current medications . atorvastatin  80 mg Oral q1800  . carvedilol  25 mg Oral BID WC  . enoxaparin (LOVENOX) injection  40 mg Subcutaneous QHS  . hydrALAZINE  25 mg Oral TID  . insulin aspart  0-9 Units Subcutaneous Q4H  . isosorbide mononitrate  30 mg Oral Daily  . NON FORMULARY   Oral BID  . NON FORMULARY   Oral Daily    acetaminophen, gi cocktail, ondansetron (ZOFRAN) IV  Prior to Admission medications   Medication Sig Start Date End Date Taking? Authorizing Provider  AMBULATORY NON FORMULARY MEDICATION Take 90 mg by mouth 2 (two) times daily. Medication Name: Brilinta 90 mg BID (TWILIGHT Research Study PROVIDED) 12/26/15  Yes Burnell Blanks, MD  AMBULATORY NON FORMULARY MEDICATION Take 81 mg by mouth daily. Medication Name: Aspirin 81 mg Daily (TWILIGHT Research study PROVIDED) 12/27/15  Yes Burnell Blanks, MD  atorvastatin (LIPITOR) 80 MG tablet Take 1 tablet (80 mg total) by mouth daily at 6 PM. 12/26/15  Yes Cheryln Manly, NP  carvedilol (COREG) 25 MG tablet Take 1 tablet (25 mg total) by mouth 2 (two) times  daily with a meal. 12/26/15  Yes Cheryln Manly, NP  hydrALAZINE (APRESOLINE) 25 MG tablet Take 1 tablet (25 mg total) by mouth 3 (three) times daily. 12/26/15  Yes Cheryln Manly, NP  ibuprofen (ADVIL,MOTRIN) 200 MG tablet Take 200 mg by mouth every 6 (six) hours as needed for moderate pain.   Yes Historical Provider, MD  isosorbide mononitrate (IMDUR) 30 MG 24 hr tablet Take 1 tablet (30 mg total) by mouth daily. 12/27/15  Yes Cheryln Manly, NP  metFORMIN (GLUCOPHAGE) 1000 MG tablet Take 1,000 mg by mouth 2 (two) times daily with a meal.   Yes Historical Provider, MD  nitroGLYCERIN (NITROSTAT) 0.4 MG SL tablet Place 1 tablet (0.4 mg total) under the  tongue every 5 (five) minutes x 3 doses as needed for chest pain. 12/26/15  Yes Cheryln Manly, NP     Social History   Social History  . Marital status: Married    Spouse name: N/A  . Number of children: N/A  . Years of education: N/A   Occupational History  . [employer]   Social History Main Topics  . Smoking status: Current Every Day Smoker    Packs/day: 0.75    Years: 29.00    Types: Cigarettes  . Smokeless tobacco: Never Used  . Alcohol use 4.2 oz/week    7 Cans of beer per week  . Drug use: No  . Sexual activity: Not Currently   Other Topics Concern  . Not on file   Social History Narrative  . No narrative on file    Family Status  Relation Status  . Brother Alive  . Mother Deceased  . Father Deceased  . Maternal Grandmother Deceased  . Maternal Grandfather Deceased  . Paternal Grandmother Deceased  . Paternal Grandfather Deceased  . Brother    Family History  Problem Relation Age of Onset  . CAD Brother   . CVA Brother   . CAD Brother      ROS:  Full 14 point review of systems complete and found to be negative unless listed above.  Physical Exam: Blood pressure (!) 148/96, pulse 88, temperature 98.2 F (36.8 C), temperature source Oral, resp. rate 17, height 5\' 5"  (1.651 m), weight 125 lb 4.8 oz (56.8 kg), SpO2 98 %.  General: Well developed, well nourished, male in no acute distress Head: Eyes PERRLA, No xanthomas.   Normocephalic and atraumatic, oropharynx without edema or exudate. Dentition: good Lungs: scattered rales Heart: HRRR S1 S2, no rub/gallop, no murmur. pulses are 2+ all 4 extrem.   Neck: No carotid bruits. No lymphadenopathy.  JVD not elevated Abdomen: Bowel sounds present, abdomen soft and non-tender without masses or hernias noted. Msk:  No spine or cva tenderness. No weakness, no joint deformities or effusions. Extremities: No clubbing or cyanosis. No edema.  Neuro: Alert and oriented X 3. No focal deficits  noted. Psych:  Good affect, responds appropriately Skin: No rashes or lesions noted.  Labs:   Lab Results  Component Value Date   WBC 9.8 01/10/2016   HGB 14.9 01/10/2016   HCT 42.5 01/10/2016   MCV 88.7 01/10/2016   PLT 300 01/10/2016     Recent Labs Lab 01/10/16 1800  NA 133*  K 4.4  CL 101  CO2 21*  BUN 21*  CREATININE 1.50*  CALCIUM 9.1  GLUCOSE 211*   No results found for: MG  Recent Labs  01/11/16 0137 01/11/16 0426 01/11/16 0711  TROPONINI <0.03 0.04* <0.03    Recent Labs  01/10/16 1818 01/10/16 2217  TROPIPOC 0.01 0.01   No results found for: BNP Lab Results  Component Value Date   CHOL 218 (H) 12/23/2015   HDL 56 12/23/2015   LDLCALC 144 (H) 12/23/2015   TRIG 92 12/23/2015   TSH  Date/Time Value Ref Range Status  12/22/2015 04:37 PM 0.920 0.350 - 4.500 uIU/mL Final    Comment:    Performed by a 3rd Generation assay with a functional sensitivity of <=0.01 uIU/mL.   Echo: 12/23/2015 - Left ventricle: The cavity size was normal. Wall thickness was increased in a pattern of moderate LVH. Systolic function was moderately to severely reduced. The estimated ejection fraction was in the range of 30% to 35%. Akinesis of the inferior myocardium. Features are consistent with a pseudonormal left ventricular filling pattern, with concomitant abnormal relaxation and increased filling pressure (grade 2 diastolic dysfunction). - Mitral valve: There was mild regurgitation. - Pericardium, extracardiac: A trivial pericardial effusion was identified.  ECG:  12/05 SR, abnl inf T waves and high lateral T waves Minor changes from 11/18  Cath: 12/22/2015  Prox RCA lesion, 20 %stenosed.  A STENT PROMUS PREM MR 2.75X28 drug eluting stent was successfully placed.  Mid Cx lesion, 95 %stenosed.  Post intervention, there is a 0% residual stenosis.  A STENT PROMUS PREM MR 3.5X20 drug eluting stent was successfully placed, and overlaps  previously placed stent.  Prox Cx to Mid Cx lesion, 99 %stenosed.  Post intervention, there is a 0% residual stenosis.  Ost 2nd Mrg to 2nd Mrg lesion, 70 %stenosed.  Post intervention, there is a 70% residual stenosis.  Ost 2nd Diag lesion, 100 %stenosed.  Prox LAD to Mid LAD lesion, 40 %stenosed. 1. Severe single vessel CAD.  2. Severe stenosis throughout the mid Circumflex involving the ostium of the moderate caliber OM2 branch.  3. Successful angioplasty OM2 4. Successful placement DES x 2 mid Circumflex.  5. Successful balloon angioplasty distal AV groove Circumflex (too small for a stent) 6. Moderate proximal LAD stenosis.  Recommendations: Will continue ASA and Brilinta for at least one year. (Will enroll in TWILIGHT study). Continue statin and beta blocker. Echo to assess LVEF.   Radiology:  Dg Chest 2 View Result Date: 01/10/2016 CLINICAL DATA:  Left-sided chest pain and left arm pain. EXAM: CHEST  2 VIEW COMPARISON:  None. FINDINGS: The heart size and mediastinal contours are within normal limits. Both lungs are clear. No pneumothorax. The visualized skeletal structures are unremarkable. IMPRESSION: No active cardiopulmonary disease. No significant change from prior. Electronically Signed   By: Ashley Royalty M.D.   On: 01/10/2016 20:22    ASSESSMENT AND PLAN:   The patient was seen today by Dr Oval Linsey, the patient evaluated and the data reviewed.   Principal Problem: 1.  Chest pain - not exertional - a troponin was 0.4, all others normal - prolonged  - ECG is abnormal, but does not seem significantly different from d/c ECG. - he is NPO, MD advise on further testing.  2. ICM: EF 30-35% at recent admit - Pt took LifeVest off on admission - for recheck before next office visit, refer for ICD if EF no better - wt is down/stable, no sx volume overload by exam. - continue w/ LifeVest after d/c.  Otherwise, per IM Active Problems:   Type 2 diabetes mellitus with diabetic  nephropathy, without long-term current use of insulin (HCC)   Tobacco abuse  Essential hypertension   CKD (chronic kidney disease), stage III   Coronary artery disease due to lipid rich plaque   Signed: Rosaria Ferries, PA-C 01/11/2016 8:43 AM Beeper [phone]  Co-Sign MD

## 2016-01-11 NOTE — Care Management Note (Signed)
Case Management Note  Patient Details  Name: Jeffery Young MRN: [id number] Date of Birth: 02-27-1965  Subjective/Objective:  50 y.o. M admitted 01/10/2016 with CP. Lives at home alone. Denies need for [facility].                   Action/Plan:CM will follow for needs as ordered.    Expected Discharge Date:                  Expected Discharge Plan:  Home/Self Care  In-House Referral:  NA  Discharge planning Services  CM Consult  Post Acute Care Choice:  NA Choice offered to:  Patient  DME Arranged:    DME Agency:     HH Arranged:    [place] Agency:     Status of Service:  In process, will continue to follow  If discussed at Long Length of Stay Meetings, dates discussed:    Additional Comments:  Delrae Sawyers, RN 01/11/2016, 10:46 AM

## 2016-01-11 NOTE — Consult Note (Signed)
CARDIOLOGY CONSULT NOTE   Patient ID: Jeffery Young MRN: [id number] DOB/AGE: August 04, 1965 50 y.o.  Admit date: 01/10/2016  Primary Physician   Andria Frames, MD Primary Cardiologist   Dr Angelena Form Reason for Consultation   Chest pain Requesting MD: Dr Eliseo Squires  QQP:[id number] Jeffery Young is a 50 y.o. year old male with a history of HTN, HLD, CKD III, FH CAD, and NIDDM .  Admit 11/16-11/20 for NSTEMI w/ DES x 2 CFX and POBA OM2. In [place] study. D/c w/ LifeVest for ICM w/ EF 30% 11/28, seen in f/u and was doing well. BMET was stable   Monday night, pt at home, not doing much and developed chest pain. It was a pulling pain. It was 8/10. He took ibuprofen x 3 and was able to sleep. Pt was mid=chest>L chest and L arm.   He woke with the chest pain on Tuesday, but it was not as bad. Later, the pain worsened and he started taking SL NTG. Took about 5 of them. The nitro would help temporarily. No change in pain w/ deep inspiration or cough. No change with position change. No associated sx.   It reminded him of his MI/anginal pain. He finally called the office and came to the ER as requested. He did not get anything for pain, but the pain finally went away. This was the first episode of pain since d/c.    Past Medical History:  Diagnosis Date  . Coronary artery disease    a.12/22/15: NSTEMI s/p overlapping DES x2 and balloon angioplasty to distal AV groove Circumflex (too small for a stent).   . Depression   . Hypercholesteremia   . Hypertension   . Prostate infection   . Tobacco abuse   . Type II diabetes mellitus ([place])      Past Surgical History:  Procedure Laterality Date  . CARDIAC CATHETERIZATION N/A 12/22/2015   Procedure: Left Heart Cath and Coronary Angiography;  Surgeon: Burnell Blanks, MD;  Location: [facility];  Service: Cardiovascular;  Laterality: N/A;  . CARDIAC CATHETERIZATION N/A 12/22/2015   Procedure: Coronary Stent Intervention;  Surgeon: Burnell Blanks, MD;  Location: [facility];  Service: Cardiovascular;  Laterality: N/A;  . CORONARY ANGIOPLASTY WITH STENT PLACEMENT  12/22/2015    No Known Allergies  I have reviewed the patient's current medications . atorvastatin  80 mg Oral q1800  . carvedilol  25 mg Oral BID WC  . enoxaparin (LOVENOX) injection  40 mg Subcutaneous QHS  . hydrALAZINE  25 mg Oral TID  . insulin aspart  0-9 Units Subcutaneous Q4H  . isosorbide mononitrate  30 mg Oral Daily  . NON FORMULARY   Oral BID  . NON FORMULARY   Oral Daily    acetaminophen, gi cocktail, ondansetron (ZOFRAN) IV  Prior to Admission medications   Medication Sig Start Date End Date Taking? Authorizing Provider  AMBULATORY NON FORMULARY MEDICATION Take 90 mg by mouth 2 (two) times daily. Medication Name: Brilinta 90 mg BID (TWILIGHT Research Study PROVIDED) 12/26/15  Yes Burnell Blanks, MD  AMBULATORY NON FORMULARY MEDICATION Take 81 mg by mouth daily. Medication Name: Aspirin 81 mg Daily (TWILIGHT Research study PROVIDED) 12/27/15  Yes Burnell Blanks, MD  atorvastatin (LIPITOR) 80 MG tablet Take 1 tablet (80 mg total) by mouth daily at 6 PM. 12/26/15  Yes Cheryln Manly, NP  carvedilol (COREG) 25 MG tablet Take 1 tablet (25 mg total) by mouth 2 (two) times  daily with a meal. 12/26/15  Yes Cheryln Manly, NP  hydrALAZINE (APRESOLINE) 25 MG tablet Take 1 tablet (25 mg total) by mouth 3 (three) times daily. 12/26/15  Yes Cheryln Manly, NP  ibuprofen (ADVIL,MOTRIN) 200 MG tablet Take 200 mg by mouth every 6 (six) hours as needed for moderate pain.   Yes Historical Provider, MD  isosorbide mononitrate (IMDUR) 30 MG 24 hr tablet Take 1 tablet (30 mg total) by mouth daily. 12/27/15  Yes Cheryln Manly, NP  metFORMIN (GLUCOPHAGE) 1000 MG tablet Take 1,000 mg by mouth 2 (two) times daily with a meal.   Yes Historical Provider, MD  nitroGLYCERIN (NITROSTAT) 0.4 MG SL tablet Place 1 tablet (0.4 mg total) under the  tongue every 5 (five) minutes x 3 doses as needed for chest pain. 12/26/15  Yes Cheryln Manly, NP     Social History   Social History  . Marital status: Married    Spouse name: N/A  . Number of children: N/A  . Years of education: N/A   Occupational History  . [employer]   Social History Main Topics  . Smoking status: Current Every Day Smoker    Packs/day: 0.75    Years: 29.00    Types: Cigarettes  . Smokeless tobacco: Never Used  . Alcohol use 4.2 oz/week    7 Cans of beer per week  . Drug use: No  . Sexual activity: Not Currently   Other Topics Concern  . Not on file   Social History Narrative  . No narrative on file    Family Status  Relation Status  . Brother Alive  . Mother Deceased  . Father Deceased  . Maternal Grandmother Deceased  . Maternal Grandfather Deceased  . Paternal Grandmother Deceased  . Paternal Grandfather Deceased  . Brother    Family History  Problem Relation Age of Onset  . CAD Brother   . CVA Brother   . CAD Brother      ROS:  Full 14 point review of systems complete and found to be negative unless listed above.  Physical Exam: Blood pressure (!) 148/96, pulse 88, temperature 98.2 F (36.8 C), temperature source Oral, resp. rate 17, height 5\' 5"  (1.651 m), weight 125 lb 4.8 oz (56.8 kg), SpO2 98 %.  General: Well developed, well nourished, male in no acute distress Head: Eyes PERRLA, No xanthomas.   Normocephalic and atraumatic, oropharynx without edema or exudate. Dentition: good Lungs: scattered rales Heart: HRRR S1 S2, no rub/gallop, no murmur. pulses are 2+ all 4 extrem.   Neck: No carotid bruits. No lymphadenopathy.  JVD not elevated Abdomen: Bowel sounds present, abdomen soft and non-tender without masses or hernias noted. Msk:  No spine or cva tenderness. No weakness, no joint deformities or effusions. Extremities: No clubbing or cyanosis. No edema.  Neuro: Alert and oriented X 3. No focal deficits  noted. Psych:  Good affect, responds appropriately Skin: No rashes or lesions noted.  Labs:   Lab Results  Component Value Date   WBC 9.8 01/10/2016   HGB 14.9 01/10/2016   HCT 42.5 01/10/2016   MCV 88.7 01/10/2016   PLT 300 01/10/2016     Recent Labs Lab 01/10/16 1800  NA 133*  K 4.4  CL 101  CO2 21*  BUN 21*  CREATININE 1.50*  CALCIUM 9.1  GLUCOSE 211*   No results found for: MG  Recent Labs  01/11/16 0137 01/11/16 0426 01/11/16 0711  TROPONINI <0.03 0.04* <0.03    Recent Labs  01/10/16 1818 01/10/16 2217  TROPIPOC 0.01 0.01   No results found for: BNP Lab Results  Component Value Date   CHOL 218 (H) 12/23/2015   HDL 56 12/23/2015   LDLCALC 144 (H) 12/23/2015   TRIG 92 12/23/2015   TSH  Date/Time Value Ref Range Status  12/22/2015 04:37 PM 0.920 0.350 - 4.500 uIU/mL Final    Comment:    Performed by a 3rd Generation assay with a functional sensitivity of <=0.01 uIU/mL.   Echo: 12/23/2015 - Left ventricle: The cavity size was normal. Wall thickness was increased in a pattern of moderate LVH. Systolic function was moderately to severely reduced. The estimated ejection fraction was in the range of 30% to 35%. Akinesis of the inferior myocardium. Features are consistent with a pseudonormal left ventricular filling pattern, with concomitant abnormal relaxation and increased filling pressure (grade 2 diastolic dysfunction). - Mitral valve: There was mild regurgitation. - Pericardium, extracardiac: A trivial pericardial effusion was identified.  ECG:  12/05 SR, abnl inf T waves and high lateral T waves Minor changes from 11/18  Cath: 12/22/2015  Prox RCA lesion, 20 %stenosed.  A STENT PROMUS PREM MR 2.75X28 drug eluting stent was successfully placed.  Mid Cx lesion, 95 %stenosed.  Post intervention, there is a 0% residual stenosis.  A STENT PROMUS PREM MR 3.5X20 drug eluting stent was successfully placed, and overlaps  previously placed stent.  Prox Cx to Mid Cx lesion, 99 %stenosed.  Post intervention, there is a 0% residual stenosis.  Ost 2nd Mrg to 2nd Mrg lesion, 70 %stenosed.  Post intervention, there is a 70% residual stenosis.  Ost 2nd Diag lesion, 100 %stenosed.  Prox LAD to Mid LAD lesion, 40 %stenosed. 1. Severe single vessel CAD.  2. Severe stenosis throughout the mid Circumflex involving the ostium of the moderate caliber OM2 branch.  3. Successful angioplasty OM2 4. Successful placement DES x 2 mid Circumflex.  5. Successful balloon angioplasty distal AV groove Circumflex (too small for a stent) 6. Moderate proximal LAD stenosis.  Recommendations: Will continue ASA and Brilinta for at least one year. (Will enroll in TWILIGHT study). Continue statin and beta blocker. Echo to assess LVEF.   Radiology:  Dg Chest 2 View Result Date: 01/10/2016 CLINICAL DATA:  Left-sided chest pain and left arm pain. EXAM: CHEST  2 VIEW COMPARISON:  None. FINDINGS: The heart size and mediastinal contours are within normal limits. Both lungs are clear. No pneumothorax. The visualized skeletal structures are unremarkable. IMPRESSION: No active cardiopulmonary disease. No significant change from prior. Electronically Signed   By: Ashley Royalty M.D.   On: 01/10/2016 20:22    ASSESSMENT AND PLAN:   The patient was seen today by Dr Oval Linsey, the patient evaluated and the data reviewed.   Principal Problem: 1.  Chest pain - not exertional - a troponin was 0.4, all others normal - prolonged  - ECG is abnormal, but does not seem significantly different from d/c ECG. - he is NPO, MD advise on further testing.  2. ICM: EF 30-35% at recent admit - Pt took LifeVest off on admission - for recheck before next office visit, refer for ICD if EF no better - wt is down/stable, no sx volume overload by exam. - continue w/ LifeVest after d/c.  Otherwise, per IM Active Problems:   Type 2 diabetes mellitus with diabetic  nephropathy, without long-term current use of insulin (HCC)   Tobacco abuse  Essential hypertension   CKD (chronic kidney disease), stage III   Coronary artery disease due to lipid rich plaque   Signed: Rosaria Ferries, PA-C 01/11/2016 8:43 AM Beeper [phone]  Co-Sign MD

## 2016-01-11 NOTE — Progress Notes (Signed)
PROGRESS NOTE    Jeffery Young  ZDG:[id number] DOB: 10/07/1965 DOA: 01/10/2016 PCP: Andria Frames, MD   Outpatient Specialists:     Brief Narrative:  Jeffery Young is a 50 y.o. male with a past medical history significant for CAD s/p DES to LCx 12/22/15, HTN, CKD, and NIDDM who presents with chest pain.  The patient was recently admitted to the Cardiology service for NSTEMI, found to have single vessel disease, treated with DES x2 to LCx, started on aspirin and Brilinta.  Echo showed EF 30-35% and so he was given Lifevest as well.  Since discharge, he has been well until last night, he was lying down to sleep when he had onset of chest discomfort and left elbow discomfort, identical to his previous angina before his stent.  This resolved by itself, but returned today.  He took "like 6 nitro" without relief, called his Cardiologist's office who recommended he be evaluated in the ER.   Assessment & Plan:   Principal Problem:   Chest pain Active Problems:   Type 2 diabetes mellitus with diabetic nephropathy, without long-term current use of insulin (HCC)   Tobacco abuse   Essential hypertension   CKD (chronic kidney disease), stage III   Coronary artery disease due to lipid rich plaque  Chest pain: -pain is identical to his recent MI -Consult to cardiology, appreciate recommendations- plan for cath -Smoking cessation was recommended, nursing teaching ordered  Coronary secondary prevention:  -Continue statin, aspirin, Brilinta- on a study -Continue BB   HTN:  -Continue hydralazine, Imdur  NIDDM:  -Hold metformin -SSI   CKD stage III -IVF pre-cath -BMP in AM   DVT prophylaxis:    Code Status: Full Code   Family Communication: patient  Disposition Plan:     Consultants:   cards  Procedures:   cath   Subjective: No SOB, no CP currently  Objective: Vitals:   01/11/16 0004 01/11/16 0036 01/11/16 0429 01/11/16 0819  BP: (!) 167/96 (!) 144/90  (!) 167/97 (!) 148/96  Pulse: (!) 102  (!) 102 88  Resp:      Temp: 97.8 F (36.6 C)  98.2 F (36.8 C) 98.2 F (36.8 C)  TempSrc: Oral  Oral Oral  SpO2: 98% 98% 98% 98%  Weight: 58 kg (127 lb 12.8 oz)  56.8 kg (125 lb 4.8 oz)   Height: 5\' 5"  (1.651 m)       Intake/Output Summary (Last 24 hours) at 01/11/16 0830 Last data filed at 01/11/16 0715  Gross per 24 hour  Intake                0 ml  Output              575 ml  Net             -575 ml   Filed Weights   01/11/16 0004 01/11/16 0429  Weight: 58 kg (127 lb 12.8 oz) 56.8 kg (125 lb 4.8 oz)    Examination:  General exam: Appears calm and comfortable  Respiratory system: Clear to auscultation. Respiratory effort normal. Cardiovascular system: S1 & S2 heard, RRR. No JVD, murmurs, rubs, gallops or clicks. No pedal edema. Gastrointestinal system: Abdomen is nondistended, soft and nontender. No organomegaly or masses felt. Normal bowel sounds heard. Central nervous system: Alert and oriented. No focal neurological deficits. .     Data Reviewed: I have personally reviewed following labs and imaging studies  CBC:  Recent Labs Lab 01/10/16 1800  WBC 9.8  HGB 14.9  HCT 42.5  MCV 88.7  PLT 416   Basic Metabolic Panel:  Recent Labs Lab 01/10/16 1800  NA 133*  K 4.4  CL 101  CO2 21*  GLUCOSE 211*  BUN 21*  CREATININE 1.50*  CALCIUM 9.1   GFR: Estimated Creatinine Clearance: 47.9 mL/min (by C-G formula based on SCr of 1.5 mg/dL (H)). Liver Function Tests: No results for input(s): AST, ALT, ALKPHOS, BILITOT, PROT, ALBUMIN in the last 168 hours. No results for input(s): LIPASE, AMYLASE in the last 168 hours. No results for input(s): AMMONIA in the last 168 hours. Coagulation Profile: No results for input(s): INR, PROTIME in the last 168 hours. Cardiac Enzymes:  Recent Labs Lab 01/11/16 0137 01/11/16 0426 01/11/16 0711  TROPONINI <0.03 0.04* <0.03   BNP (last 3 results) No results for input(s): PROBNP  in the last 8760 hours. HbA1C: No results for input(s): HGBA1C in the last 72 hours. CBG:  Recent Labs Lab 01/10/16 2014 01/11/16 0008 01/11/16 0635  GLUCAP 158* 288* 172*   Lipid Profile: No results for input(s): CHOL, HDL, LDLCALC, TRIG, CHOLHDL, LDLDIRECT in the last 72 hours. Thyroid Function Tests: No results for input(s): TSH, T4TOTAL, FREET4, T3FREE, THYROIDAB in the last 72 hours. Anemia Panel: No results for input(s): VITAMINB12, FOLATE, FERRITIN, TIBC, IRON, RETICCTPCT in the last 72 hours. Urine analysis:    Component Value Date/Time   COLORURINE YELLOW 08/21/2012 2250   APPEARANCEUR CLEAR 08/21/2012 2250   LABSPEC 1.013 08/21/2012 2250   PHURINE 6.0 08/21/2012 2250   GLUCOSEU NEGATIVE 08/21/2012 2250   HGBUR MODERATE (A) 08/21/2012 2250   BILIRUBINUR NEGATIVE 08/21/2012 2250   KETONESUR 15 (A) 08/21/2012 2250   PROTEINUR 100 (A) 08/21/2012 2250   UROBILINOGEN 1.0 08/21/2012 2250   NITRITE NEGATIVE 08/21/2012 2250   LEUKOCYTESUR NEGATIVE 08/21/2012 2250   Sepsis Labs: @LABRCNTIP (procalcitonin:4,lacticidven:4)  )No results found for this or any previous visit (from the past 240 hour(s)).    Anti-infectives    None       Radiology Studies: Dg Chest 2 View  Result Date: 01/10/2016 CLINICAL DATA:  Left-sided chest pain and left arm pain. EXAM: CHEST  2 VIEW COMPARISON:  None. FINDINGS: The heart size and mediastinal contours are within normal limits. Both lungs are clear. No pneumothorax. The visualized skeletal structures are unremarkable. IMPRESSION: No active cardiopulmonary disease. No significant change from prior. Electronically Signed   By: Ashley Royalty M.D.   On: 01/10/2016 20:22        Scheduled Meds: . atorvastatin  80 mg Oral q1800  . carvedilol  25 mg Oral BID WC  . enoxaparin (LOVENOX) injection  40 mg Subcutaneous QHS  . hydrALAZINE  25 mg Oral TID  . insulin aspart  0-9 Units Subcutaneous Q8H  . isosorbide mononitrate  30 mg Oral  Daily  . NON FORMULARY   Oral BID  . NON FORMULARY   Oral Daily   Continuous Infusions:   LOS: 0 days    Time spent: 25 min    [place], DO [facility] Pager [phone]  If 7PM-7AM, please contact night-coverage [email or web address] Password [email or web address] 01/11/2016, 8:30 AM

## 2016-01-11 NOTE — Progress Notes (Signed)
Patient arrived to unit [place] bed 38.Assisted to bed by nursing staff.Patient denies chest pain or discomfort at present time.Patient placed on telemetry and oriented to unit and call bell system.Will continue to monitor.

## 2016-01-12 ENCOUNTER — Encounter (HOSPITAL_COMMUNITY): Payer: Self-pay | Admitting: Cardiovascular Disease

## 2016-01-12 DIAGNOSIS — I200 Unstable angina: Secondary | ICD-10-CM

## 2016-01-12 LAB — BASIC METABOLIC PANEL
Anion gap: 8 (ref 5–15)
BUN: 27 mg/dL — AB (ref 6–20)
CHLORIDE: 104 mmol/L (ref 101–111)
CO2: 24 mmol/L (ref 22–32)
CREATININE: 1.51 mg/dL — AB (ref 0.61–1.24)
Calcium: 8.6 mg/dL — ABNORMAL LOW (ref 8.9–10.3)
GFR calc Af Amer: >60 mL/min (ref >60)
GFR calc non Af Amer: 53 mL/min — ABNORMAL LOW (ref >60)
Glucose, Bld: 125 mg/dL — ABNORMAL HIGH (ref 65–99)
Potassium: 3.8 mmol/L (ref 3.5–5.1)
Sodium: 136 mmol/L (ref 135–145)

## 2016-01-12 LAB — CBC
HCT: 33.3 % — ABNORMAL LOW (ref 39.0–52.0)
HEMOGLOBIN: 11.1 g/dL — AB (ref 13.0–17.0)
MCH: 29.9 pg (ref 26.0–34.0)
MCHC: 33.3 g/dL (ref 30.0–36.0)
MCV: 89.8 fL (ref 78.0–100.0)
Platelets: 207 10*3/uL (ref 150–400)
RBC: 3.71 MIL/uL — AB (ref 4.22–5.81)
RDW: 12.7 % (ref 11.5–15.5)
WBC: 8.9 10*3/uL (ref 4.0–10.5)

## 2016-01-12 LAB — GLUCOSE, CAPILLARY
GLUCOSE-CAPILLARY: 181 mg/dL — AB (ref 65–99)
Glucose-Capillary: 146 mg/dL — ABNORMAL HIGH (ref 65–99)

## 2016-01-12 MED ORDER — METFORMIN HCL 1000 MG PO TABS: 1000.0000 mg | ORAL_TABLET | Freq: Two times a day (BID) | ORAL | Status: DC

## 2016-01-12 MED ORDER — ASPIRIN 81 MG PO CHEW: 81.0000 mg | CHEWABLE_TABLET | Freq: Every day | ORAL | Status: DC

## 2016-01-12 MED ORDER — TICAGRELOR 90 MG PO TABS: 90.0000 mg | ORAL_TABLET | Freq: Two times a day (BID) | ORAL | 10 refills | Status: DC

## 2016-01-12 MED ORDER — ANGIOPLASTY BOOK
Freq: Once | Status: DC
  Filled 2016-01-12: qty 1

## 2016-01-12 MED ORDER — TICAGRELOR 90 MG PO TABS: 90.0000 mg | ORAL_TABLET | Freq: Two times a day (BID) | ORAL | 0 refills | Status: DC

## 2016-01-12 MED ORDER — NICOTINE 14 MG/24HR TD PT24: 14.0000 mg | MEDICATED_PATCH | Freq: Every day | TRANSDERMAL | Status: DC

## 2016-01-12 MED ORDER — NICOTINE 14 MG/24HR TD PT24: 14.0000 mg | MEDICATED_PATCH | Freq: Every day | TRANSDERMAL | 0 refills | Status: DC

## 2016-01-12 MED ORDER — LIVING BETTER WITH HEART FAILURE BOOK
Freq: Once | Status: AC
  Administered 2016-01-12: 12:00:00

## 2016-01-12 NOTE — Progress Notes (Signed)
CARDIAC REHAB PHASE I   PRE:  Rate/Rhythm: 92 SR    BP: sitting 149/75    SaO2:   MODE:  Ambulation: 500 ft   POST:  Rate/Rhythm: 113 ST    BP: sitting 170/84     SaO2:   Pt c/o left elbow pain/soreness upon getting OOB. This resolved after a few minutes. He sts it has been coming and going. No CP associated with it (although ? If they went together on admit). Pt HR up to 113 ST walking. He endorses fatigue and SOB. He had not been walking at home as we had discussed last admit. He was also smoking. We had a long discussion, again, of risk of smoking. He is thinking about quitting and would like a nicotine patch prescription at d/c in case he has some insurance coverage for it. I set up smoking cessation video. I gave him ex gl again. He needs numerous discussions regarding information. He was surprised by his heart disease, "thought I was fixed". I tried to explain his disease and draw a picture but he would benefit from cardiology discussing it with him. He understands the importance of Brilinta but there is some confusion with the [place] trial and what he gets from hospital. He is already referred to [facility]. Encouraged more walking if no d/c. [place], ACSM 01/12/2016 8:59 AM

## 2016-01-12 NOTE — Progress Notes (Signed)
Patient Name: Jeffery Young Date of Encounter: 01/12/2016  Primary Cardiologist: Dr. Shon Millet Problem List     Principal Problem:   Chest pain Active Problems:   Type 2 diabetes mellitus with diabetic nephropathy, without long-term current use of insulin (HCC)   Tobacco abuse   Essential hypertension   CKD (chronic kidney disease), stage III   Coronary artery disease due to lipid rich plaque   Hypertensive heart disease with heart failure (HCC)   Chronic combined systolic and diastolic heart failure (HCC)   Elevated troponin   Unstable angina pectoris [facility])     Patient Profile     Jeffery Young is a 39M with CAD status post recent left circumflex PCI, chronic systolic and diastolic heart failure LVEF 30-35%, hypertension, hyperlipidemia, diabetes, and CKD III who presented with recurrent chest pain and mildly elevated troponin.   Subjective   Still with mild, recurrent, intermittent chest and left arm pain at rest. He ambulated with cardiac rehab earlier this am w/o exertional symptoms.   Inpatient Medications    Scheduled Meds: . angioplasty book   Does not apply Once  . aspirin  81 mg Oral Daily  . atorvastatin  80 mg Oral q1800  . carvedilol  25 mg Oral BID WC  . enoxaparin (LOVENOX) injection  40 mg Subcutaneous QHS  . hydrALAZINE  50 mg Oral TID  . insulin aspart  0-9 Units Subcutaneous Q4H  . isosorbide mononitrate  60 mg Oral Daily  . Living Better with Heart Failure Book   Does not apply Once  . NON FORMULARY   Oral BID  . NON FORMULARY   Oral Daily  . sodium chloride flush  3 mL Intravenous Q12H  . ticagrelor  90 mg Oral BID   Continuous Infusions:  PRN Meds: sodium chloride, acetaminophen, gi cocktail, ondansetron (ZOFRAN) IV, sodium chloride flush   Vital Signs    Vitals:   01/11/16 1948 01/11/16 2000 01/12/16 0325 01/12/16 0759  BP: 140/81 135/76 (!) 159/72 (!) 149/75  Pulse: 80  93 96  Resp: 17 (!) 28 (!) 22 20  Temp: 98.3 F (36.8 C)   98.1 F (36.7 C) 98 F (36.7 C)  TempSrc: Oral  Oral Oral  SpO2: 96%  94% 98%  Weight:   125 lb 10.6 oz (57 kg)   Height:        Intake/Output Summary (Last 24 hours) at 01/12/16 1032 Last data filed at 01/12/16 0800  Gross per 24 hour  Intake            893.6 ml  Output             2300 ml  Net          -1406.4 ml   Filed Weights   01/11/16 0004 01/11/16 0429 01/12/16 0325  Weight: 127 lb 12.8 oz (58 kg) 125 lb 4.8 oz (56.8 kg) 125 lb 10.6 oz (57 kg)    Physical Exam   GEN: Well nourished, well developed, in no acute distress.  HEENT: Grossly normal.  Neck: Supple, no JVD, carotid bruits, or masses. Cardiac: RRR, no murmurs, rubs, or gallops. No clubbing, cyanosis, edema.  Radials/DP/PT 2+ and equal bilaterally.  Respiratory:  Respirations regular and unlabored, clear to auscultation bilaterally. GI: Soft, nontender, nondistended, BS + x 4. MS: no deformity or atrophy. Skin: warm and dry, no rash. Neuro:  Strength and sensation are intact. Psych: AAOx3.  Normal affect.  Labs    CBC  Recent  Labs  01/10/16 1800 01/12/16 0242  WBC 9.8 8.9  HGB 14.9 11.1*  HCT 42.5 33.3*  MCV 88.7 89.8  PLT 300 962   Basic Metabolic Panel  Recent Labs  01/11/16 1201 01/12/16 0242  NA 137 136  K 4.1 3.8  CL 105 104  CO2 28 24  GLUCOSE 164* 125*  BUN 24* 27*  CREATININE 1.50* 1.51*  CALCIUM 8.8* 8.6*   Liver Function Tests No results for input(s): AST, ALT, ALKPHOS, BILITOT, PROT, ALBUMIN in the last 72 hours. No results for input(s): LIPASE, AMYLASE in the last 72 hours. Cardiac Enzymes  Recent Labs  01/11/16 0137 01/11/16 0426 01/11/16 0711  TROPONINI <0.03 0.04* <0.03   BNP Invalid input(s): POCBNP D-Dimer No results for input(s): DDIMER in the last 72 hours. Hemoglobin A1C No results for input(s): HGBA1C in the last 72 hours. Fasting Lipid Panel No results for input(s): CHOL, HDL, LDLCALC, TRIG, CHOLHDL, LDLDIRECT in the last 72 hours. Thyroid Function  Tests No results for input(s): TSH, T4TOTAL, T3FREE, THYROIDAB in the last 72 hours.  Invalid input(s): FREET3  Telemetry    NSR - Personally Reviewed  ECG    Repeat 12 lead pending.   Radiology    Dg Chest 2 View  Result Date: 01/10/2016 CLINICAL DATA:  Left-sided chest pain and left arm pain. EXAM: CHEST  2 VIEW COMPARISON:  None. FINDINGS: The heart size and mediastinal contours are within normal limits. Both lungs are clear. No pneumothorax. The visualized skeletal structures are unremarkable. IMPRESSION: No active cardiopulmonary disease. No significant change from prior. Electronically Signed   By: Ashley Royalty M.D.   On: 01/10/2016 20:22    Cardiac Studies   Procedures   Coronary Stent Intervention  Intravascular Pressure Wire/FFR Study  Left Heart Cath and Coronary Angiography  Conclusion   1. Continued patency of the stented segments throughout the proximal and mid circumflex 2. Severe stenosis of the distal circumflex into the left PDA treated successfully with a 2.25 x 15 mm DES 3. Nonobstructive LAD stenosis with total occlusion of the small diagonal branch filled by left to left collaterals 4. Mild to moderate stenosis of a small nondominant RCA 5. Moderate stenosis at the ostium of the second OM branch with negative pressure wire evaluation  Continue ASA/brilinta, secondary risk reduction measures     Patient Profile     Jeffery Young is a 48M with CAD status post recent left circumflex PCI, chronic systolic and diastolic heart failure LVEF 30-35%, hypertension, hyperlipidemia, diabetes, and CKD III who presented with recurrent chest pain and mildly elevated troponin  Assessment & Plan    1. CAD/ Unstable Angina:  Small troponin leak yesterday at 0.04. LHC showed continued patency of the stented segments throughout the proximal and mid circumflex,  severe stenosis of the distal circumflex into the left PDA treated successfully with a 2.25 x 15 mm DES. There was  also nonobstructive LAD stenosis with total occlusion of the small diagonal branch filled by left to left collaterals. Mild to moderate stenosis of a small nondominant RCA and moderate stenosis at the ostium of the second OM branch with negative pressure wire evaluation. He will remain on ASA + Brilinta, high dose Lipitor, Coreg, Imdur and hydralazine. Still with mild, recurrent, intermittent chest and left arm pain at rest. He ambulated with cardiac rehab earlier this am w/o exertional symptoms. Mild active CP at present. Will check STAT 12 lead EKG to assess for any ST changes.  He may need  further titration of his Imdur. Monitor closely.   2. HTN: mildly elevated in the 599H systolic. He just received morning meds which include Coreg, Imdur and hydralazine. Monitor closely.   3. Cardiomyopathy/ Systolic HF: EF, post MI 74/1423, was reduced at 30-35%. He has a lifevest and reports full compliance at home. Volume status is stable. Continue medical therapy with BB, hydralazine + nitrate. No ACE/ARB at this time given renal insufficiency. Plan is to repeat 2D echo in 3 months, after treatment with guidelines directed medical therapy for heart failure. If EF remains <35%, he will need referral to EP for consideration for ICD.   4. Renal Insufficiency: Scr 1.51. Improved from 1.70 from last hospitalization 12/2015. Currently not on an ACE/ARB.   5. HLD: LDL was 144 during admission in Nov. Continue high intensity Lipitor. Recheck FLP and HFTs in 6 weeks.   6. DM: PBG levels elevated by improving. Continue on Insulin inpatient. Outpatient management per PCP.   7.Tobacco Abuse: he continues to smoke. He is requesting Rx for nicotine patch at time of discharge.   Signed, Lyda Jester, PA-C  01/12/2016, 10:32 AM

## 2016-01-12 NOTE — Discharge Summary (Signed)
Discharge Summary    Patient ID: Jeffery Young,  MRN: [id number], DOB/AGE: [birth year] 50 y.o.  Admit date: 01/10/2016 Discharge date: 01/12/2016  Primary Care Provider: Paulino Rily Primary Cardiologist: Dr. Clifton James   Discharge Diagnoses    Principal Problem:   Chest pain Active Problems:   Type 2 diabetes mellitus with diabetic nephropathy, without long-term current use of insulin (HCC)   Tobacco abuse   Essential hypertension   CKD (chronic kidney disease), stage III   Coronary artery disease due to lipid rich plaque   Hypertensive heart disease with heart failure (HCC)   Chronic combined systolic and diastolic heart failure (HCC)   Elevated troponin   Unstable angina pectoris (HCC)   Allergies No Known Allergies  Diagnostic Studies/Procedures   Procedures - LHC 01/11/16  Coronary Stent Intervention  Intravascular Pressure Wire/FFR Study  Left Heart Cath and Coronary Angiography  Conclusion   1. Continued patency of the stented segments throughout the proximal and mid circumflex 2. Severe stenosis of the distal circumflex into the left PDA treated successfully with a 2.25 x 15 mm DES 3. Nonobstructive LAD stenosis with total occlusion of the small diagonal branch filled by left to left collaterals 4. Mild to moderate stenosis of a small nondominant RCA 5. Moderate stenosis at the ostium of the second OM branch with negative pressure wire evaluation  Continue ASA/brilinta, secondary risk reduction measures    History of Present Illness     Jeffery Young a 50 y.o.malewith a history of hypertension, hyperlipidemia, diabetes type 2, ongoing tobacco abuse and strong family history of CAD (twin brother with MI)who presented to [facility] ED on 11/16with ongoing chest pain 2 weeks. He ruled in for non-ST elevation myocardial infarction with troponin peaking at 5. EKG showed inferior lateral T-wave inversions. He was started on IV heparin and taken  to the cath lab for left heart catheterization which showed severe single-vessel disease in the left circumflex with 95% stenosis that was treated successfully with PCI utilizing a drug-eluting stent. He also underwent by POBA to the OM 2. He was placed on dual antiplatelet therapy with aspirin plus Brilinta (Twilight Study). He was continued on a statin and beta blocker. An echocardiogram was obtained which revealed reduced left ventricular systolic function with an estimated ejection fraction of 30-35% with akinesis of the inferior myocardium along with grade 2 diastolic dysfunction. He was noted to have a rise in  serum creatinine following cath. This was treated with gentle IV hydration. SCr day of d/c was 1.7. His blood pressure also remained poorly uncontrolled and hydralazine was added to his regimen which resulted in improvement. Given his MI with EF of less than 30-35%, he was fitted for a LifeVest.  He presented back to [facility] on 01/10/16 with recurrent CP. He was lying down to sleep when he had sudden onset of chest discomfort and left elbow discomfort, identical to his previous angina before his stent but unrelieved with SL NTG. He called the office and was advised to go to the ED. Initial ECG in ED showed sinus tachycardia with a new right axis and initial troponin was negative. Given his symptoms and history, he was admitted for observation.  Hospital Course     Patient was admitted to telemetry for recurrent unstable angina. Cardiac enzymes were cycled. Troponin was slightly abnormal x 1 at 0.04. Subsequently, he was referred for repeat LHC. Procedure was performed by Dr. Excell Seltzer 01/11/16, via the right radial artery. LHC showed continued  patency of the stented segments throughout the proximal and mid circumflex,  severe stenosis of the distal circumflex into the left PDA treated successfully with a 2.25 x 15 mm DES. There was also nonobstructive LAD stenosis with total occlusion of the small diagonal  branch filled by left to left collaterals. Mild to moderate stenosis of a small nondominant RCA and moderate stenosis at the ostium of the second OM branch with negative pressure wire evaluation. He will remain on ASA + Brilinta , high dose Lipitor, Coreg, Imdur and hydralazine. His Imdur was increased to 60 mg. Unfortunately, he no longer meets requirements for [facility] and has been dis enrolled. He will continue to wear his Life Vest. He was last seen by Dr. Duke Salvia, who determined he was stable for discharge home. He was chest pain free w/o post cath complications. VSS. SCr also stable at 1.5. He was instructed to hold his metformin 48 hrs post cath. Post hospital f/u has been arranged in 1 week on 01/20/16.   Consultants: none     Discharge Vitals Blood pressure 133/77, pulse 85, temperature 98.2 F (36.8 C), temperature source Oral, resp. rate 20, height 5\' 5"  (1.651 m), weight 125 lb 10.6 oz (57 kg), SpO2 96 %.  Filed Weights   01/11/16 0004 01/11/16 0429 01/12/16 0325  Weight: 127 lb 12.8 oz (58 kg) 125 lb 4.8 oz (56.8 kg) 125 lb 10.6 oz (57 kg)    Labs & Radiologic Studies    CBC  Recent Labs  01/10/16 1800 01/12/16 0242  WBC 9.8 8.9  HGB 14.9 11.1*  HCT 42.5 33.3*  MCV 88.7 89.8  PLT 300 207   Basic Metabolic Panel  Recent Labs  01/11/16 1201 01/12/16 0242  NA 137 136  K 4.1 3.8  CL 105 104  CO2 28 24  GLUCOSE 164* 125*  BUN 24* 27*  CREATININE 1.50* 1.51*  CALCIUM 8.8* 8.6*   Liver Function Tests No results for input(s): AST, ALT, ALKPHOS, BILITOT, PROT, ALBUMIN in the last 72 hours. No results for input(s): LIPASE, AMYLASE in the last 72 hours. Cardiac Enzymes  Recent Labs  01/11/16 0137 01/11/16 0426 01/11/16 0711  TROPONINI <0.03 0.04* <0.03   BNP Invalid input(s): POCBNP D-Dimer No results for input(s): DDIMER in the last 72 hours. Hemoglobin A1C No results for input(s): HGBA1C in the last 72 hours. Fasting Lipid Panel No results for  input(s): CHOL, HDL, LDLCALC, TRIG, CHOLHDL, LDLDIRECT in the last 72 hours. Thyroid Function Tests No results for input(s): TSH, T4TOTAL, T3FREE, THYROIDAB in the last 72 hours.  Invalid input(s): FREET3 _____________  Dg Chest 2 View  Result Date: 01/10/2016 CLINICAL DATA:  Left-sided chest pain and left arm pain. EXAM: CHEST  2 VIEW COMPARISON:  None. FINDINGS: The heart size and mediastinal contours are within normal limits. Both lungs are clear. No pneumothorax. The visualized skeletal structures are unremarkable. IMPRESSION: No active cardiopulmonary disease. No significant change from prior. Electronically Signed   By: Tollie Eth M.D.   On: 01/10/2016 20:22   Dg Chest 2 View  Result Date: 12/22/2015 CLINICAL DATA:  Worsening chest pain radiating to left arm for past 2 weeks. Hypertension, diabetes, and hypercholesterolemia. EXAM: CHEST  2 VIEW COMPARISON:  03/03/2010 FINDINGS: The heart size and mediastinal contours are within normal limits. Both lungs are clear. The visualized skeletal structures are unremarkable. IMPRESSION: Negative.  No active cardiopulmonary disease. Electronically Signed   By: Myles Rosenthal M.D.   On: 12/22/2015 08:08  Disposition   Pt is being discharged home today in good condition.  Follow-up Plans & Appointments    Follow-up Information    Robbie Lis, PA-C Follow up on 01/20/2016.   Specialties:  Cardiology, Radiology Why:  11:00 AM (cardiology follow-up) Contact information: [address] [place] [address] [phone]            Discharge Medications   Current Discharge Medication List    START taking these medications   Details  aspirin 81 MG chewable tablet Chew 1 tablet (81 mg total) by mouth daily.    nicotine (NICODERM CQ - DOSED IN MG/24 HOURS) 14 mg/24hr patch Place 1 patch (14 mg total) onto the skin daily. Qty: 28 patch, Refills: 0    !! ticagrelor (BRILINTA) 90 MG TABS tablet Take 1 tablet (90 mg  total) by mouth 2 (two) times daily. Qty: 60 tablet, Refills: 10    !! ticagrelor (BRILINTA) 90 MG TABS tablet Take 1 tablet (90 mg total) by mouth 2 (two) times daily. Qty: 60 tablet, Refills: 0     !! - Potential duplicate medications found. Please discuss with provider.    CONTINUE these medications which have CHANGED   Details  metFORMIN (GLUCOPHAGE) 1000 MG tablet Take 1 tablet (1,000 mg total) by mouth 2 (two) times daily with a meal.      CONTINUE these medications which have NOT CHANGED   Details  atorvastatin (LIPITOR) 80 MG tablet Take 1 tablet (80 mg total) by mouth daily at 6 PM. Qty: 30 tablet, Refills: 6    carvedilol (COREG) 25 MG tablet Take 1 tablet (25 mg total) by mouth 2 (two) times daily with a meal. Qty: 60 tablet, Refills: 6    hydrALAZINE (APRESOLINE) 25 MG tablet Take 1 tablet (25 mg total) by mouth 3 (three) times daily. Qty: 90 tablet, Refills: 6    isosorbide mononitrate (IMDUR) 30 MG 24 hr tablet Take 1 tablet (30 mg total) by mouth daily. Qty: 30 tablet, Refills: 6    nitroGLYCERIN (NITROSTAT) 0.4 MG SL tablet Place 1 tablet (0.4 mg total) under the tongue every 5 (five) minutes x 3 doses as needed for chest pain. Qty: 25 tablet, Refills: 3      STOP taking these medications     AMBULATORY NON FORMULARY MEDICATION      AMBULATORY NON FORMULARY MEDICATION      ibuprofen (ADVIL,MOTRIN) 200 MG tablet          Aspirin prescribed at discharge?  Yes High Intensity Statin Prescribed? (Lipitor 40-80mg  or Crestor 20-40mg ): Yes Beta Blocker Prescribed? Yes For EF <40%, was ACEI/ARB Prescribed? No: renal insufficiency (he is on Nitrates + hydralazine) ADP Receptor Inhibitor Prescribed? (i.e. Plavix etc.-Includes Medically Managed Patients): Yes For EF <40%, Aldosterone Inhibitor Prescribed? No: renal insufficiency  Was EF assessed during THIS hospitalization? Yes Was Cardiac Rehab II ordered? (Included Medically managed Patients): Yes     Outstanding Labs/Studies   None   Duration of Discharge Encounter   Greater than 30 minutes including physician time.  Signed, Robbie Lis PA-C 01/12/2016, 1:05 PM   History and all data above reviewed.  Patient examined.  I agree with the findings as above.  All available labs, radiology testing, previous records reviewed. Agree with documented discharge summary.   Alexcis Bicking C. Duke Salvia, MD, [facility]

## 2016-01-12 NOTE — Research (Signed)
TWILIGHT Research study patient 17 days post original PCI, admitted for clinically driven revascularization (PCI). Unfortunately this is a study endpoint and the patient is ineligible for Protocol randomization. Spoke with patient and updated. He will need a prescription for Brilinta. Nurse Clair Gulling aware.

## 2016-01-12 NOTE — Progress Notes (Signed)
Pt copay will be $30 - prior auth not required for brilinta.

## 2016-01-20 ENCOUNTER — Ambulatory Visit (INDEPENDENT_AMBULATORY_CARE_PROVIDER_SITE_OTHER): Payer: BC Managed Care – PPO | Admitting: Cardiology

## 2016-01-20 ENCOUNTER — Encounter: Payer: Self-pay | Admitting: *Deleted

## 2016-01-20 ENCOUNTER — Encounter: Payer: Self-pay | Admitting: Cardiology

## 2016-01-20 VITALS — BP 160/92 | HR 76 | Ht 65.0 in | Wt 131.0 lb

## 2016-01-20 DIAGNOSIS — I5022 Chronic systolic (congestive) heart failure: Secondary | ICD-10-CM

## 2016-01-20 MED ORDER — HYDRALAZINE HCL 50 MG PO TABS: 50.0000 mg | ORAL_TABLET | Freq: Three times a day (TID) | ORAL | 9 refills | Status: DC

## 2016-01-20 NOTE — Patient Instructions (Signed)
Medication Instructions:  Your physician has recommended you make the following change in your medication:  1. Increase Hydrazaline (50 mg ) three times daily, Sent in today to patient's requested pharmacy.   Labwork: -None  Testing/Procedures: -None  Follow-Up: Your physician recommends that you keep your scheduled  follow-up appointment in January with Lyda Jester,  PA.    Any Other Special Instructions Will Be Listed Below (If Applicable).  Patient received a letter today to do light duty at work, no lifting over 25 lbs Faxed patient's Nicotine Replacement Therapy Information to [phone]     If you need a refill on your cardiac medications before your next appointment, please call your pharmacy.

## 2016-01-20 NOTE — Progress Notes (Signed)
01/20/2016 Jeffery Young   Oct 05, 1965  [id number]  Primary Physician Andria Frames, MD Primary Cardiologist: Dr. Angelena Form   Reason for Visit/CC: [facility] F/u for CAD   HPI:  Jeffery Young a 50 y.o.malewith a history of hypertension, hyperlipidemia, diabetes type 2, ongoing tobacco abuse and strong family history of CAD (twin brother with MI)who presented to [facility] ED on 11/16with ongoing chest pain 2 weeks. He ruled in for non-ST elevation myocardial infarction with troponin peaking at 5. EKGshowed inferior lateral T-wave inversions. He was started on IV heparin and taken to the cathlab for left heart catheterization which showed severe single-vessel disease in the left circumflex with 95% stenosis that was treated successfully with PCI utilizing a drug-eluting stent. He also underwent by POBA to the OM 2. He was placed on dual antiplatelet therapy with aspirin plus Brilinta (Twilight Study). He was continued on a statin and beta blocker. An echocardiogram was obtained which revealed reduced left ventricular systolic function with an estimated ejection fraction of 30-35% with akinesis of the inferior myocardium along with grade 2 diastolic dysfunction. He was noted to have a rise in serum creatinine following cath. This was treated with gentle IV hydration. SCr day of d/c was 1.7. His blood pressure also remained poorly uncontrolled and hydralazine was added to his regimen which resulted in improvement. Given his MI with EF of less than 30-35%,he was fitted for a LifeVest.  He presented back to [facility] on 01/10/16 with recurrent CP. He was lying down to sleep when he had sudden onset of chest discomfort and left elbow discomfort, identical to his previous angina before his stent but unrelieved with SL NTG. He called the office and was advised to go to the ED. Initial ECG in ED showed sinus tachycardia with a new right axisand initial troponin was negative. Given his symptoms  and history, he was admitted for observation.   Patient was admitted to telemetry for recurrent unstable angina. Cardiac enzymes were cycled. Troponin was slightly abnormal x 1 at 0.04. Subsequently, he was referred for repeat LHC. Procedure was performed by Dr. Burt Knack 01/11/16, via the right radial artery. LHC showed continued patency of the stented segments throughout the proximal and mid circumflex, severe stenosis of the distal circumflex into the left PDA treated successfully with a 2.25 x 15 mm DES. There was also nonobstructive LAD stenosis with total occlusion of the small diagonal branch filled by left to left collaterals. Mild to moderate stenosis of a small nondominant RCA and moderate stenosis at the ostium of the second OM branch with negative pressure wire evaluation. He was continued on ASA + Brilinta , high dose Lipitor, Coreg, Imdur and hydralazine. His Imdur was increased to 60 mg. Unfortunately, he no longer meets requirements for [facility] and has been dis enrolled. He was discharged home on 12/7 and instructed to continue with his lifevest.  He presents back to clinic today for post hospital f/u. He has done well. No recurrent CP and no dyspnea. He reports full medication compliance. His BP is high in clinic today at 160/92. He admits that he had 3 cups of regular coffee prior to this mornings appt. He also continues to smoke, but is enrolling in a smoking cessation program, offered by his employer, that will provide him with free nicotine patches. He smokes ~1/2 ppd. He is compliant with his Lifevest "most days" but is not wearing it today. No dizziness, palpitations, syncope/ near syncope.     Current Meds  Medication Sig  . aspirin 81 MG chewable tablet Chew 1 tablet (81 mg total) by mouth daily.  Marland Kitchen atorvastatin (LIPITOR) 80 MG tablet Take 1 tablet (80 mg total) by mouth daily at 6 PM.  . carvedilol (COREG) 25 MG tablet Take 1 tablet (25 mg total) by mouth 2 (two) times daily  with a meal.  . hydrALAZINE (APRESOLINE) 50 MG tablet Take 1 tablet (50 mg total) by mouth 3 (three) times daily.  . isosorbide mononitrate (IMDUR) 30 MG 24 hr tablet Take 1 tablet (30 mg total) by mouth daily.  . metFORMIN (GLUCOPHAGE) 1000 MG tablet Take 1 tablet (1,000 mg total) by mouth 2 (two) times daily with a meal.  . nicotine (NICODERM CQ - DOSED IN MG/24 HOURS) 14 mg/24hr patch Place 1 patch (14 mg total) onto the skin daily.  . nitroGLYCERIN (NITROSTAT) 0.4 MG SL tablet Place 1 tablet (0.4 mg total) under the tongue every 5 (five) minutes x 3 doses as needed for chest pain.  . ticagrelor (BRILINTA) 90 MG TABS tablet Take 1 tablet (90 mg total) by mouth 2 (two) times daily.  . [DISCONTINUED] hydrALAZINE (APRESOLINE) 25 MG tablet Take 1 tablet (25 mg total) by mouth 3 (three) times daily.  . [DISCONTINUED] ticagrelor (BRILINTA) 90 MG TABS tablet Take 1 tablet (90 mg total) by mouth 2 (two) times daily.   No Known Allergies Past Medical History:  Diagnosis Date  . Coronary artery disease    a.12/22/15: NSTEMI s/p overlapping DES x2 and balloon angioplasty to distal AV groove Circumflex (too small for a stent).   . Depression   . Hypercholesteremia   . Hypertension   . Prostate infection   . Tobacco abuse   . Type II diabetes mellitus (HCC)    Family History  Problem Relation Age of Onset  . CAD Brother   . CVA Brother   . CAD Brother    Past Surgical History:  Procedure Laterality Date  . CARDIAC CATHETERIZATION N/A 12/22/2015   Procedure: Left Heart Cath and Coronary Angiography;  Surgeon: Burnell Blanks, MD;  Location: [place] [facility];  Service: Cardiovascular;  Laterality: N/A;  . CARDIAC CATHETERIZATION N/A 12/22/2015   Procedure: Coronary Stent Intervention;  Surgeon: Burnell Blanks, MD;  Location: [place] [facility];  Service: Cardiovascular;  Laterality: N/A;  . CARDIAC CATHETERIZATION N/A 01/11/2016   Procedure: Left Heart Cath and Coronary  Angiography;  Surgeon: Sherren Mocha, MD;  Location: [place] [facility];  Service: Cardiovascular;  Laterality: N/A;  . CARDIAC CATHETERIZATION N/A 01/11/2016   Procedure: Intravascular Pressure Wire/FFR Study;  Surgeon: Sherren Mocha, MD;  Location: [place] [facility];  Service: Cardiovascular;  Laterality: N/A;  . CARDIAC CATHETERIZATION N/A 01/11/2016   Procedure: Coronary Stent Intervention;  Surgeon: Sherren Mocha, MD;  Location: [place] [facility];  Service: Cardiovascular;  Laterality: N/A;  . CORONARY ANGIOPLASTY WITH STENT PLACEMENT  12/22/2015   Social History   Social History  . Marital status: Married    Spouse name: N/A  . Number of children: N/A  . Years of education: N/A   Occupational History  . [employer]   Social History Main Topics  . Smoking status: Current Every Day Smoker    Packs/day: 0.75    Years: 29.00    Types: Cigarettes  . Smokeless tobacco: Never Used  . Alcohol use 4.2 oz/week    7 Cans of beer per week  . Drug use: No  .  Sexual activity: Not Currently   Other Topics Concern  . Not on file   Social History Narrative  . No narrative on file     Review of Systems: General: negative for chills, fever, night sweats or weight changes.  Cardiovascular: negative for chest pain, dyspnea on exertion, edema, orthopnea, palpitations, paroxysmal nocturnal dyspnea or shortness of breath Dermatological: negative for rash Respiratory: negative for cough or wheezing Urologic: negative for hematuria Abdominal: negative for nausea, vomiting, diarrhea, bright red blood per rectum, melena, or hematemesis Neurologic: negative for visual changes, syncope, or dizziness All other systems reviewed and are otherwise negative except as noted above.   Physical Exam:  Blood pressure (!) 160/92, pulse 76, height 5\' 5"  (1.651 m), weight 131 lb (59.4 kg).  General appearance: alert, cooperative and no distress Neck: no carotid bruit and no  JVD Lungs: clear to auscultation bilaterally Heart: regular rate and rhythm, S1, S2 normal, no murmur, click, rub or gallop Extremities: extremities normal, atraumatic, no cyanosis or edema Pulses: 2+ and symmetric Skin: Skin color, texture, turgor normal. No rashes or lesions Neurologic: Grossly normal  EKG not performed   ASSESSMENT AND PLAN:   1. CAD/ Unstable Angina:  Repeat LHC on 01/11/16 showed continued patency of the stented segments throughout the proximal and mid circumflex,  severe stenosis of the distal circumflex into the left PDA treated successfully with a 2.25 x 15 mm DES. There was also nonobstructive LAD stenosis with total occlusion of the small diagonal branch filled by left to left collaterals. Mild to moderate stenosis of a small nondominant RCA and moderate stenosis at the ostium of the second OM branch with negative pressure wire evaluation.    -He is stable w/o recurrent angina. Continued ASA + Brilinta, high dose Lipitor, Coreg, Imdur and hydralazine.  2. HTN:  elevated in clinic today at 160/92.Marland Kitchen He had 3 cups of coffee this morning and continues to smoke 1/2 ppd. Pt advised to stop smoking and to limit caffeine intake. He is on the Max dose of Coreg. No ACE/ARB given CKD. We will increase his hydralazine to 50 mg TID. Continue Imdur.   3. Cardiomyopathy/ Systolic HF: EF, post MI 78/2956, was reduced at 30-35%. Volume status is stable. Continue medical therapy with BB, hydralazine + nitrate. No ACE/ARB at this time given renal insufficiency. Plan is to repeat 2D echo in 3 months, after treatment with guidelines directed medical therapy for heart failure. If EF remains <35%, he will need referral to EP for consideration for ICD. Patient advised to improve compliance with Lifevest.   4. Renal Insufficiency: Scr 1.51 on 12/7,  Improved from 1.70 from prior hospitalization 12/2015. Currently not on an ACE/ARB.   5. HLD:LDL was 144 during admission in Nov. Continue  high intensity Lipitor. Recheck FLP and HFTs in 6 weeks.   6. DM: followed by PCP.   7.Tobacco Abuse: he continues to smoke 1/2 ppd. Smoking cessation advised. He is enrolling in a smoking cessation program, offered by his employer, that will provide him with free nicotine patches.   PLAN  F/u in 3-4 weeks for repeat assessment and further titration of his HF meds.   Lyda Jester PA-C 01/20/2016 12:30 PM

## 2016-02-09 ENCOUNTER — Ambulatory Visit: Payer: BC Managed Care – PPO | Admitting: Cardiology

## 2016-02-14 ENCOUNTER — Other Ambulatory Visit: Payer: BC Managed Care – PPO | Admitting: *Deleted

## 2016-02-14 ENCOUNTER — Other Ambulatory Visit: Payer: BC Managed Care – PPO

## 2016-02-14 DIAGNOSIS — I10 Essential (primary) hypertension: Secondary | ICD-10-CM

## 2016-02-14 DIAGNOSIS — E7800 Pure hypercholesterolemia, unspecified: Secondary | ICD-10-CM

## 2016-02-14 NOTE — Addendum Note (Signed)
Addended by: Eulis Foster on: 02/14/2016 10:20 AM   Modules accepted: Orders

## 2016-02-15 LAB — HEPATIC FUNCTION PANEL
ALK PHOS: 115 IU/L (ref 39–117)
ALT: 18 IU/L (ref 0–44)
AST: 14 IU/L (ref 0–40)
Albumin: 3.3 g/dL — ABNORMAL LOW (ref 3.5–5.5)
Bilirubin Total: 0.2 mg/dL (ref 0.0–1.2)
Bilirubin, Direct: 0.06 mg/dL (ref 0.00–0.40)
TOTAL PROTEIN: 6 g/dL (ref 6.0–8.5)

## 2016-02-15 LAB — LIPID PANEL
CHOL/HDL RATIO: 3.7 ratio (ref 0.0–5.0)
Cholesterol, Total: 191 mg/dL (ref 100–199)
HDL: 52 mg/dL (ref >39)
LDL Calculated: 102 mg/dL — ABNORMAL HIGH (ref 0–99)
Triglycerides: 184 mg/dL — ABNORMAL HIGH (ref 0–149)
VLDL CHOLESTEROL CAL: 37 mg/dL (ref 5–40)

## 2016-02-16 ENCOUNTER — Telehealth: Payer: Self-pay | Admitting: Cardiology

## 2016-02-16 NOTE — Telephone Encounter (Signed)
°  Follow Up   Pt is calling to follow up on blood work results. Please call.

## 2016-02-16 NOTE — Telephone Encounter (Signed)
Patient is returning your call.    Thanks

## 2016-02-17 ENCOUNTER — Telehealth (HOSPITAL_COMMUNITY): Payer: Self-pay | Admitting: Family Medicine

## 2016-02-17 NOTE — Telephone Encounter (Signed)
S/w Toney Reil with [employer], verified insurance, Deductible $1250.00 with 20% Co-Insurance, Out of Pocket $4,350.00, no limits Reference # [id number] ... KJ

## 2016-02-17 NOTE — Telephone Encounter (Signed)
-----   Message from Consuelo Pandy, [place] sent at 02/16/2016  1:34 PM EST ----- His cholesterol is still higher than what it should be. We would like for his LDL (bad cholesterol) to be <70 mg. It is 102. Check compliance and make sure is he taking his Lipitor nightly. If fully compliant, we will need to refer him to lipid clinic for consideration of initiation of Zetia vs PCSK-9 inhibitor to help lower LDL. He has had a recent STEMI and repeat trip to the cath lab following MI with repeat PCI. We need to be more aggressive with lowering his cholesterol.

## 2016-02-17 NOTE — Telephone Encounter (Signed)
Follow up  ° ° °Patient returning call back to nurse  °

## 2016-02-17 NOTE — Telephone Encounter (Signed)
Returned call to pt and left another message for pt to call back.  

## 2016-02-17 NOTE — Telephone Encounter (Signed)
Pt has been made aware of his lab results. He does admit not taking his Lipitor every night. He has been advised of the recommendations above, but wants to try taking his meds every night, and watching what he eats and pt was advised we will try that and repeat his lipids on 03/01/16 o/v with Ellen Henri, PA-C. Pt was advised to come to that o/v fasting. Pt agreeable and verbalized understanding.

## 2016-02-20 ENCOUNTER — Encounter (HOSPITAL_COMMUNITY): Payer: Self-pay | Admitting: *Deleted

## 2016-02-20 ENCOUNTER — Telehealth (HOSPITAL_COMMUNITY): Payer: Self-pay | Admitting: Cardiac Rehabilitation

## 2016-02-20 NOTE — Telephone Encounter (Signed)
pc to discuss enrolling in cardiac rehab. Pt declined due to high insurance copay.  Pt offered maintenance program. Pt declined due to cost.  Pt encouraged to exercise on his own. Understanding verbalized.

## 2016-02-27 ENCOUNTER — Telehealth: Payer: Self-pay | Admitting: Cardiology

## 2016-02-27 NOTE — Telephone Encounter (Signed)
Pt advised to contact PCP for recommendations for treatment for cold, pt agreed.

## 2016-02-27 NOTE — Telephone Encounter (Signed)
New Message:     Pt says he has a terrible cold,he wants to know if [place] would call him in a Z Pack?

## 2016-02-29 ENCOUNTER — Telehealth: Payer: Self-pay | Admitting: Cardiovascular Disease

## 2016-02-29 NOTE — Telephone Encounter (Signed)
Medication Detail    Disp Refills Start End   hydrALAZINE (APRESOLINE) 50 MG tablet 90 tablet 9 01/20/2016    Sig - Route: Take 1 tablet (50 mg total) by mouth 3 (three) times daily. - Oral   E-Prescribing Status: Receipt confirmed by pharmacy (01/20/2016 12:00 PM EST)   Pharmacy   [facility] [address], [place]

## 2016-02-29 NOTE — Telephone Encounter (Signed)
New Message   *STAT* If patient is at the pharmacy, call can be transferred to refill team.   1. Which medications need to be refilled? (please list name of each medication and dose if known)  hydralazine (apresoline) 50 mg tablets three times daily  2. Which pharmacy/location (including street and city if local pharmacy) is medication to be sent to? [facility] [address] [place], [place], [place]  3. Do they need a 30 day or 90 day supply?  30 day supply  Pt voiced Ria Comment sent a prescription for 25 mg instead of 50 mg.  Please confirm with pt once prescription has been submitted.

## 2016-02-29 NOTE — Telephone Encounter (Signed)
Refills are already at the pharmacy. I spoke with patient and he is aware that the refills are there in fact he has already picked up the rx for 50 mg. He was not calling to ask that this be refilled he was wanting to find out who sent in an rx for his previous dose of 25 mg. The pharmacy told him that this was filled yesterday by a Mendel Ryder. I do not see where an rx was sent in yesterday for 25 mg. Patient questioned if maybe it was just an automatic refill, which it could be if he had refills remaining on his previous dose. Patient is aware that per his chart 50 mg is current therapy for him.

## 2016-03-01 ENCOUNTER — Encounter: Payer: Self-pay | Admitting: Cardiology

## 2016-03-01 ENCOUNTER — Ambulatory Visit (INDEPENDENT_AMBULATORY_CARE_PROVIDER_SITE_OTHER): Payer: BC Managed Care – PPO | Admitting: Cardiology

## 2016-03-01 ENCOUNTER — Other Ambulatory Visit: Payer: Self-pay

## 2016-03-01 ENCOUNTER — Telehealth: Payer: Self-pay | Admitting: *Deleted

## 2016-03-01 ENCOUNTER — Ambulatory Visit (HOSPITAL_COMMUNITY): Payer: BC Managed Care – PPO | Attending: Internal Medicine

## 2016-03-01 ENCOUNTER — Other Ambulatory Visit: Payer: Self-pay | Admitting: Cardiology

## 2016-03-01 ENCOUNTER — Encounter: Payer: Self-pay | Admitting: *Deleted

## 2016-03-01 VITALS — BP 174/80 | HR 76 | Ht 65.0 in | Wt 131.0 lb

## 2016-03-01 DIAGNOSIS — I5022 Chronic systolic (congestive) heart failure: Secondary | ICD-10-CM

## 2016-03-01 DIAGNOSIS — E7800 Pure hypercholesterolemia, unspecified: Secondary | ICD-10-CM | POA: Diagnosis not present

## 2016-03-01 DIAGNOSIS — Z720 Tobacco use: Secondary | ICD-10-CM | POA: Diagnosis not present

## 2016-03-01 DIAGNOSIS — I5021 Acute systolic (congestive) heart failure: Secondary | ICD-10-CM | POA: Diagnosis present

## 2016-03-01 DIAGNOSIS — E119 Type 2 diabetes mellitus without complications: Secondary | ICD-10-CM | POA: Diagnosis not present

## 2016-03-01 DIAGNOSIS — I10 Essential (primary) hypertension: Secondary | ICD-10-CM | POA: Diagnosis not present

## 2016-03-01 LAB — ECHOCARDIOGRAM LIMITED
HEIGHTINCHES: 65 in
Weight: 2096 oz

## 2016-03-01 NOTE — Patient Instructions (Signed)
Medication Instructions:   Your physician recommends that you continue on your current medications as directed. Please refer to the Current Medication list given to you today.   If you need a refill on your cardiac medications before your next appointment, please call your pharmacy.  Labwork: NONE ORDERED  TODAY    Testing/Procedures: Your physician has requested that you have an echocardiogram. Echocardiography is a painless test that uses sound waves to create images of your heart. It provides your doctor with information about the size and shape of your heart and how well your heart's chambers and valves are working. This procedure takes approximately one hour. There are no restrictions for this procedure.   Follow-Up: BP CHECK FOLLOW UP WITH PHARM D  ON SAME DAY AS ECHO..  IN 3 MONTHS WITH DR Angelena Form    Any Other Special Instructions Will Be Listed Below (If Applicable).

## 2016-03-01 NOTE — Telephone Encounter (Signed)
Pt has been made aware of his echo results. He has been made aware he can return back to work, he can turn the life vest in, and he can continue with his medication regimen.  Pt agreeable with this plan and verbalized understanding.

## 2016-03-01 NOTE — Progress Notes (Signed)
03/01/2016 Jeffery Young   1965/07/28  [id number]  Primary Physician Paulino Rily, MD Primary Cardiologist: Dr. Clifton James    Reason for Visit/CC: F/u for CAD   HPI:  Jeffery Young a 51 y.o.malewith a history of hypertension, hyperlipidemia, diabetes type 2, ongoing tobacco abuse and strong family history of CAD (twin brother with MI)who presented to [facility] ED on 11/16with ongoing chest pain 2 weeks. He ruled in for non-ST elevation myocardial infarction with troponin peaking at 5. EKGshowed inferior lateral T-wave inversions. He was started on IV heparin and taken to the cathlab for left heart catheterization which showed severe single-vessel disease in the left circumflex with 95% stenosis that was treated successfully with PCI utilizing a drug-eluting stent. He also underwent by POBA to the OM 2. He was placed on dual antiplatelet therapy with aspirin plus Brilinta (Twilight Study). He was continued on a statin and beta blocker. An echocardiogram was obtained which revealed reduced left ventricular systolic function with an estimated ejection fraction of 30-35% with akinesis of the inferior myocardium along with grade 2 diastolic dysfunction. He was noted to have a rise in serum creatinine following cath. This was treated with gentle IV hydration. SCr day of d/c was 1.7. His blood pressure also remained poorly uncontrolled and hydralazine was added to his regimen which resulted in improvement. Given his MI with EF of less than 30-35%,he was fitted for a LifeVest.  He presented back to [facility] on 01/10/16 with recurrent CP. He was lying down to sleep when he had sudden onset of chest discomfort and left elbow discomfort, identical to his previous angina before his stent but unrelieved with SL NTG. He called the office and was advised to go to the ED. Initial ECG in EDshowed sinus tachycardia with a new right axisand initial troponin was negative. Given his symptoms and history,  he was admitted for observation.   Patient was admitted to telemetry for recurrent unstable angina. Cardiac enzymes were cycled. Troponin was slightly abnormal x 1 at 0.04. Subsequently, he was referred for repeat LHC. Procedure was performed by Dr. Excell Seltzer 01/11/16, via the right radial artery. LHC showed continued patency of the stented segments throughout the proximal and mid circumflex, severe stenosis of the distal circumflex into the left PDA treated successfully with a 2.25 x 15 mm DES. There was also nonobstructive LAD stenosis with total occlusion of the small diagonal branch filled by left to left collaterals. Mild to moderate stenosis of a small nondominant RCA and moderate stenosis at the ostium of the second OM branch with negative pressure wire evaluation. He was continued on ASA + Brilinta , high dose Lipitor, Coreg,Imdur and hydralazine. His Imdur was increased to 60 mg. Unfortunately, he no longer meets requirements for [facility] and has been dis enrolled. He was discharged home on 12/7 and instructed to continue with his lifevest.  At his last OV 01/20/16, his HF meds were titrated. He was already on the Max dose of Coreg. His hydralazine was increased to 50 mg TID. His Imdur was continued. Repeat Lipid panel was obtained. LDL was improved but still not at goal of < 70 mg/dL. HDL had improved from 144 >>578 mg/dL. Pt admitted that he was not taking Lipitor nightly. He was advised to take nightly, with plans to recheck in 4 weeks and refer to Lipid Clinic for consideration for PCSK9 inhibitor therapy if still not at goal.   Today in clinic he reports that he has done well cardiac wise.  He denies any recurrent anginal symptoms. No CP or dyspnea. No acute HF symptoms, denying dyspnea, orthopnea, PND and LEE. He also denies palpitations. Unfortunately, he has been noncompliant with is Lifevest. He reports full med compliance but has not taken all of his BP meds this am. He has improved  compliance with his statin, now taking it nightly. He continues to smoke < 1ppd. He has nicotine patches at home but has not tried them yet. He also had 2 cups of coffee this am. BP is elevated at 174/80. He notes his BP was good at PCP office yesterday in the 130s systolic and 70s diastolic. He also recently purchased a home BP cuff and notes BP has been controlled at home. He does not want to adjust his BP meds today.    Current Meds  Medication Sig  . aspirin 81 MG chewable tablet Chew 1 tablet (81 mg total) by mouth daily.  Marland Kitchen atorvastatin (LIPITOR) 80 MG tablet Take 1 tablet (80 mg total) by mouth daily at 6 PM.  . carvedilol (COREG) 25 MG tablet Take 1 tablet (25 mg total) by mouth 2 (two) times daily with a meal.  . hydrALAZINE (APRESOLINE) 50 MG tablet Take 1 tablet (50 mg total) by mouth 3 (three) times daily.  . isosorbide mononitrate (IMDUR) 30 MG 24 hr tablet Take 1 tablet (30 mg total) by mouth daily.  . metFORMIN (GLUCOPHAGE) 1000 MG tablet Take 1 tablet (1,000 mg total) by mouth 2 (two) times daily with a meal.  . nicotine (NICODERM CQ - DOSED IN MG/24 HOURS) 14 mg/24hr patch Place 1 patch (14 mg total) onto the skin daily.  . nitroGLYCERIN (NITROSTAT) 0.4 MG SL tablet Place 1 tablet (0.4 mg total) under the tongue every 5 (five) minutes x 3 doses as needed for chest pain.  . ticagrelor (BRILINTA) 90 MG TABS tablet Take 1 tablet (90 mg total) by mouth 2 (two) times daily.   No Known Allergies Past Medical History:  Diagnosis Date  . Coronary artery disease    a.12/22/15: NSTEMI s/p overlapping DES x2 and balloon angioplasty to distal AV groove Circumflex (too small for a stent).   . Depression   . Hypercholesteremia   . Hypertension   . Prostate infection   . Tobacco abuse   . Type II diabetes mellitus (HCC)    Family History  Problem Relation Age of Onset  . CAD Brother   . CVA Brother   . CAD Brother    Past Surgical History:  Procedure Laterality Date  . CARDIAC  CATHETERIZATION N/A 12/22/2015   Procedure: Left Heart Cath and Coronary Angiography;  Surgeon: Kathleene Hazel, MD;  Location: [facility] INVASIVE CV LAB;  Service: Cardiovascular;  Laterality: N/A;  . CARDIAC CATHETERIZATION N/A 12/22/2015   Procedure: Coronary Stent Intervention;  Surgeon: Kathleene Hazel, MD;  Location: [facility] INVASIVE CV LAB;  Service: Cardiovascular;  Laterality: N/A;  . CARDIAC CATHETERIZATION N/A 01/11/2016   Procedure: Left Heart Cath and Coronary Angiography;  Surgeon: Tonny Bollman, MD;  Location: [facility] INVASIVE CV LAB;  Service: Cardiovascular;  Laterality: N/A;  . CARDIAC CATHETERIZATION N/A 01/11/2016   Procedure: Intravascular Pressure Wire/FFR Study;  Surgeon: Tonny Bollman, MD;  Location: [facility] INVASIVE CV LAB;  Service: Cardiovascular;  Laterality: N/A;  . CARDIAC CATHETERIZATION N/A 01/11/2016   Procedure: Coronary Stent Intervention;  Surgeon: Tonny Bollman, MD;  Location: [name] INVASIVE CV LAB;  Service: Cardiovascular;  Laterality: N/A;  . CORONARY ANGIOPLASTY WITH STENT PLACEMENT  12/22/2015  Social History   Social History  . Marital status: Married    Spouse name: N/A  . Number of children: N/A  . Years of education: N/A   Occupational History  . CUSTODIAN [employer]   Social History Main Topics  . Smoking status: Current Every Day Smoker    Packs/day: 0.75    Years: 29.00    Types: Cigarettes  . Smokeless tobacco: Never Used  . Alcohol use 4.2 oz/week    7 Cans of beer per week  . Drug use: No  . Sexual activity: Not Currently   Other Topics Concern  . Not on file   Social History Narrative  . No narrative on file     Review of Systems: General: negative for chills, fever, night sweats or weight changes.  Cardiovascular: negative for chest pain, dyspnea on exertion, edema, orthopnea, palpitations, paroxysmal nocturnal dyspnea or shortness of breath Dermatological: negative for rash Respiratory: negative for cough or  wheezing Urologic: negative for hematuria Abdominal: negative for nausea, vomiting, diarrhea, bright red blood per rectum, melena, or hematemesis Neurologic: negative for visual changes, syncope, or dizziness All other systems reviewed and are otherwise negative except as noted above.   Physical Exam:  Blood pressure (!) 174/80, pulse 76, height 5\' 5"  (1.651 m), weight 131 lb (59.4 kg).  General appearance: alert, cooperative and no distress Neck: no carotid bruit and no JVD Lungs: clear to auscultation bilaterally Heart: regular rate and rhythm, S1, S2 normal, no murmur, click, rub or gallop Extremities: extremities normal, atraumatic, no cyanosis or edema Pulses: 2+ and symmetric Skin: Skin color, texture, turgor normal. No rashes or lesions Neurologic: Grossly normal  EKG Not performed   ASSESSMENT AND PLAN:   1. CAD/ Unstable Angina: s/p STEMI in 12/2014, s/p PCI utilizing a drug-eluting stent. He also underwent by POBA to the OM 2. He had recurrent unstable angina with repeat LHC on 01/11/16 showing continued patency of the stented segments throughout the proximal and mid circumflex, severe stenosis of the distal circumflex into the left PDA treated successfully with a 2.25 x 15 mm DES. There was also nonobstructive LAD stenosis with total occlusion of the small diagonal branch filled by left to left collaterals. Mild to moderate stenosis of a small nondominant RCA and moderate stenosis at the ostium of the second OM branch with negative pressure wire evaluation.               -He is stable w/o recurrent angina. Continued ASA + Brilinta, high dose Lipitor, Coreg, Imdur and hydralazine.  2. HTN:  elevated in clinic today at 174/80. He had 3 cups of coffee this morning and continues to smoke 1/2 ppd. Pt advised to stop smoking and to limit caffeine intake. He is on the Max dose of Coreg. No ACE/ARB given CKD. We increased his hydralazine to 50 mg TID at his last OV. He refuses to  increase his meds further today. He states his BP has been controlled at home and was controlled at PCP office today. He admits that he has not taken all of his BP meds yet this morning. Pt advised to do so when he gets home and to monitor BP at home. We will do a repeat BP check when he returns for his echo in the next 1-2 weeks. Continue Imdur, Coreg and Hydralazine.    3. Cardiomyopathy/ Systolic HF: EF,post MI 12/2015, wasreduced at 30-35%. Volume status is stable. Continue medical therapy with BB, hydralazine +  nitrate. No ACE/ARB at this time given renal insufficiency. He has been noncompliant with his Lifevest. He denies any s/s of acute CHF. No palpitations, dizziness, syncope/ near syncope. It has been > 45 days since his STEMI. We will go ahead and order a repeat limited 2D echo to reassess LVF. Hopefully, there has been improvement in EF, given coronary reperfusion and medical therapy.   4. Renal Insufficiency: Scr 1.51 on 12/7,  Improved from 1.70 from prior hospitalization 12/2015. Currently not on anACE/ARB.   5. ZOX:WRUEAV Lipid panel was obtained at last OV. LDL was improved but still not at goal of < 70 mg/dL. HDL had improved from 144 >>409 mg/dL. Pt admitted that he was not taking Lipitor nightly. He was advised to take nightly, with plans to recheck in 4 weeks and refer to Lipid Clinic for consideration for PCSK9 inhibitor therapy if still not at goal.    6. DM: followed by PCP.   7.Tobacco Abuse: he continues to smoke 1/2 ppd. Smoking cessation advised. He is enrolling in a smoking cessation program, offered by his employer, that will provide him with free nicotine patches. He was encouraged to start this.    PLAN  Repeat 2D echo to reassess LVF. Repeat BP check in HTN clinic when he returns. F/u with Dr. Eulis Foster in 2-3 months.   Raushanah Osmundson PA-C 03/01/2016 10:30 AM

## 2016-03-06 ENCOUNTER — Ambulatory Visit: Payer: BC Managed Care – PPO

## 2016-03-06 NOTE — Progress Notes (Deleted)
Patient ID: Jeffery Young                 DOB: [birth year]                      MRN: [id number]     HPI: Jeffery Young is a 51 y.o. male patient of Dr. Angelena Form, refferred by Ellen Henri, PA with PMH below who presents today for hypertension evaluation. Recent ECHO revealed EF 55-60%. At his last visit with Ellen Henri, BP elevated to 174/80; no medication changes were made as pt's pressure at home has been controlled and his pressure was controlled at PCP the day before.     Cardiac Hx: HTN, HLD, DM2 Tobacco abuse, strong family history of CAD, NSTEMI (12/2014),   Current HTN meds:  Isosorbide mononitrate 30mg  daily Hydralazine 50mg  TID Carvedilol 25mg  BID  Previously tried:  lisinopril  BP goal: <130/80  Family History: Twin brother with MI,   Social History:   Diet:   Exercise:   Home BP readings:   Wt Readings from Last 3 Encounters:  03/01/16 131 lb (59.4 kg)  01/20/16 131 lb (59.4 kg)  01/12/16 125 lb 10.6 oz (57 kg)   BP Readings from Last 3 Encounters:  03/01/16 (!) 174/80  01/20/16 (!) 160/92  01/12/16 133/77   Pulse Readings from Last 3 Encounters:  03/01/16 76  01/20/16 76  01/12/16 85    Renal function: CrCl cannot be calculated (Patient's most recent lab result is older than the maximum 21 days allowed.).  Past Medical History:  Diagnosis Date  . Coronary artery disease    a.12/22/15: NSTEMI s/p overlapping DES x2 and balloon angioplasty to distal AV groove Circumflex (too small for a stent).   . Depression   . Hypercholesteremia   . Hypertension   . Prostate infection   . Tobacco abuse   . Type II diabetes mellitus ([place])     Current Outpatient Prescriptions on File Prior to Visit  Medication Sig Dispense Refill  . aspirin 81 MG chewable tablet Chew 1 tablet (81 mg total) by mouth daily.    Marland Kitchen atorvastatin (LIPITOR) 80 MG tablet Take 1 tablet (80 mg total) by mouth daily at 6 PM. 30 tablet 6  . carvedilol (COREG) 25 MG tablet Take 1  tablet (25 mg total) by mouth 2 (two) times daily with a meal. 60 tablet 6  . hydrALAZINE (APRESOLINE) 50 MG tablet Take 1 tablet (50 mg total) by mouth 3 (three) times daily. 90 tablet 9  . isosorbide mononitrate (IMDUR) 30 MG 24 hr tablet Take 1 tablet (30 mg total) by mouth daily. 30 tablet 6  . metFORMIN (GLUCOPHAGE) 1000 MG tablet Take 1 tablet (1,000 mg total) by mouth 2 (two) times daily with a meal.    . nicotine (NICODERM CQ - DOSED IN MG/24 HOURS) 14 mg/24hr patch Place 1 patch (14 mg total) onto the skin daily. 28 patch 0  . nitroGLYCERIN (NITROSTAT) 0.4 MG SL tablet Place 1 tablet (0.4 mg total) under the tongue every 5 (five) minutes x 3 doses as needed for chest pain. 25 tablet 3  . ticagrelor (BRILINTA) 90 MG TABS tablet Take 1 tablet (90 mg total) by mouth 2 (two) times daily. 60 tablet 10   No current facility-administered medications on file prior to visit.     No Known Allergies  There were no vitals taken for this visit.   Assessment/Plan: Hypertension:    Thank you, Lelan Pons. Patterson Hammersmith,  PharmD  [facility]  03/06/2016 8:23 AM

## 2016-03-09 ENCOUNTER — Other Ambulatory Visit: Payer: Self-pay

## 2016-03-09 ENCOUNTER — Emergency Department (HOSPITAL_COMMUNITY): Payer: BC Managed Care – PPO

## 2016-03-09 ENCOUNTER — Encounter (HOSPITAL_COMMUNITY): Payer: Self-pay | Admitting: Emergency Medicine

## 2016-03-09 ENCOUNTER — Emergency Department (HOSPITAL_COMMUNITY)
Admission: EM | Admit: 2016-03-09 | Discharge: 2016-03-09 | Disposition: A | Discharge status: Home / Self Care | Payer: BC Managed Care – PPO | Attending: Emergency Medicine | Admitting: Emergency Medicine

## 2016-03-09 DIAGNOSIS — N183 Chronic kidney disease, stage 3 (moderate): Secondary | ICD-10-CM | POA: Insufficient documentation

## 2016-03-09 DIAGNOSIS — W228XXA Striking against or struck by other objects, initial encounter: Secondary | ICD-10-CM | POA: Diagnosis not present

## 2016-03-09 DIAGNOSIS — Y990 Civilian activity done for income or pay: Secondary | ICD-10-CM | POA: Insufficient documentation

## 2016-03-09 DIAGNOSIS — S0990XA Unspecified injury of head, initial encounter: Secondary | ICD-10-CM

## 2016-03-09 DIAGNOSIS — S0003XA Contusion of scalp, initial encounter: Secondary | ICD-10-CM | POA: Diagnosis not present

## 2016-03-09 DIAGNOSIS — I2510 Atherosclerotic heart disease of native coronary artery without angina pectoris: Secondary | ICD-10-CM | POA: Diagnosis not present

## 2016-03-09 DIAGNOSIS — Z23 Encounter for immunization: Secondary | ICD-10-CM | POA: Diagnosis not present

## 2016-03-09 DIAGNOSIS — E1122 Type 2 diabetes mellitus with diabetic chronic kidney disease: Secondary | ICD-10-CM | POA: Diagnosis not present

## 2016-03-09 DIAGNOSIS — F17210 Nicotine dependence, cigarettes, uncomplicated: Secondary | ICD-10-CM | POA: Insufficient documentation

## 2016-03-09 DIAGNOSIS — Z7982 Long term (current) use of aspirin: Secondary | ICD-10-CM | POA: Insufficient documentation

## 2016-03-09 DIAGNOSIS — Y929 Unspecified place or not applicable: Secondary | ICD-10-CM | POA: Diagnosis not present

## 2016-03-09 DIAGNOSIS — I252 Old myocardial infarction: Secondary | ICD-10-CM | POA: Diagnosis not present

## 2016-03-09 DIAGNOSIS — I951 Orthostatic hypotension: Secondary | ICD-10-CM | POA: Insufficient documentation

## 2016-03-09 DIAGNOSIS — Z955 Presence of coronary angioplasty implant and graft: Secondary | ICD-10-CM | POA: Insufficient documentation

## 2016-03-09 DIAGNOSIS — Y939 Activity, unspecified: Secondary | ICD-10-CM | POA: Diagnosis not present

## 2016-03-09 DIAGNOSIS — Z79899 Other long term (current) drug therapy: Secondary | ICD-10-CM | POA: Insufficient documentation

## 2016-03-09 LAB — COMPREHENSIVE METABOLIC PANEL
ALK PHOS: 86 U/L (ref 38–126)
ALT: 15 U/L — AB (ref 17–63)
AST: 21 U/L (ref 15–41)
Albumin: 3 g/dL — ABNORMAL LOW (ref 3.5–5.0)
Anion gap: 10 (ref 5–15)
BILIRUBIN TOTAL: 0.3 mg/dL (ref 0.3–1.2)
BUN: 28 mg/dL — ABNORMAL HIGH (ref 6–20)
CALCIUM: 8.9 mg/dL (ref 8.9–10.3)
CO2: 22 mmol/L (ref 22–32)
CREATININE: 1.91 mg/dL — AB (ref 0.61–1.24)
Chloride: 104 mmol/L (ref 101–111)
GFR, EST AFRICAN AMERICAN: 46 mL/min — AB (ref >60)
GFR, EST NON AFRICAN AMERICAN: 39 mL/min — AB (ref >60)
Glucose, Bld: 147 mg/dL — ABNORMAL HIGH (ref 65–99)
Potassium: 4.7 mmol/L (ref 3.5–5.1)
Sodium: 136 mmol/L (ref 135–145)
TOTAL PROTEIN: 6.4 g/dL — AB (ref 6.5–8.1)

## 2016-03-09 LAB — CBC WITH DIFFERENTIAL/PLATELET
Basophils Absolute: 0.1 10*3/uL (ref 0.0–0.1)
Basophils Relative: 1 %
Eosinophils Absolute: 0.2 10*3/uL (ref 0.0–0.7)
Eosinophils Relative: 2 %
HCT: 34.7 % — ABNORMAL LOW (ref 39.0–52.0)
HEMOGLOBIN: 11.2 g/dL — AB (ref 13.0–17.0)
LYMPHS ABS: 1.3 10*3/uL (ref 0.7–4.0)
LYMPHS PCT: 14 %
MCH: 29.4 pg (ref 26.0–34.0)
MCHC: 32.3 g/dL (ref 30.0–36.0)
MCV: 91.1 fL (ref 78.0–100.0)
MONOS PCT: 8 %
Monocytes Absolute: 0.8 10*3/uL (ref 0.1–1.0)
NEUTROS PCT: 75 %
Neutro Abs: 7 10*3/uL (ref 1.7–7.7)
Platelets: 251 10*3/uL (ref 150–400)
RBC: 3.81 MIL/uL — AB (ref 4.22–5.81)
RDW: 13 % (ref 11.5–15.5)
WBC: 9.3 10*3/uL (ref 4.0–10.5)

## 2016-03-09 LAB — URINALYSIS, ROUTINE W REFLEX MICROSCOPIC
Bilirubin Urine: NEGATIVE
GLUCOSE, UA: 50 mg/dL — AB
Hgb urine dipstick: NEGATIVE
Ketones, ur: NEGATIVE mg/dL
Leukocytes, UA: NEGATIVE
Nitrite: NEGATIVE
SPECIFIC GRAVITY, URINE: 1.014 (ref 1.005–1.030)
Squamous Epithelial / LPF: NONE SEEN
pH: 5 (ref 5.0–8.0)

## 2016-03-09 LAB — CBG MONITORING, ED: Glucose-Capillary: 163 mg/dL — ABNORMAL HIGH (ref 65–99)

## 2016-03-09 LAB — I-STAT TROPONIN, ED: Troponin i, poc: 0.02 ng/mL (ref 0.00–0.08)

## 2016-03-09 LAB — TROPONIN I

## 2016-03-09 MED ORDER — SODIUM CHLORIDE 0.9 % IV BOLUS (SEPSIS)
1000.0000 mL | Freq: Once | INTRAVENOUS | Status: AC
  Administered 2016-03-09: 1000 mL via INTRAVENOUS

## 2016-03-09 MED ORDER — ONDANSETRON HCL 4 MG/2ML IJ SOLN
4.0000 mg | Freq: Once | INTRAMUSCULAR | Status: AC
  Administered 2016-03-09: 4 mg via INTRAVENOUS
  Filled 2016-03-09: qty 2

## 2016-03-09 MED ORDER — TETANUS-DIPHTH-ACELL PERTUSSIS 5-2.5-18.5 LF-MCG/0.5 IM SUSP
0.5000 mL | Freq: Once | INTRAMUSCULAR | Status: AC
Start: 2016-03-09 — End: 2016-03-09
  Administered 2016-03-09: 0.5 mL via INTRAMUSCULAR
  Filled 2016-03-09: qty 0.5

## 2016-03-09 NOTE — Discharge Instructions (Signed)
Do not take your blood pressure medication tonight.  Hold your brilinta today.  TED hose if symptoms persist.

## 2016-03-09 NOTE — ED Provider Notes (Signed)
[facility] DEPT Provider Note   CSN: [id number] Arrival date & time: 03/09/16  4098     History   Chief Complaint Chief Complaint  Patient presents with  . Loss of Consciousness    HPI Byrne Capek is a 51 y.o. male.  Pt presents to the ED with a syncopal event.  The pt said that he was getting into a van at work and passed out.  He does not know what happened.  He just remembers getting up off the ground.  He did hit the back of his head and has a little pain to his left hip, but otherwise, feels ok.  He just had a MI in November which required 2 stents.  This is his first week back to work.      Past Medical History:  Diagnosis Date  . Coronary artery disease    a.12/22/15: NSTEMI s/p overlapping DES x2 and balloon angioplasty to distal AV groove Circumflex (too small for a stent).   . Depression   . Hypercholesteremia   . Hypertension   . Prostate infection   . Tobacco abuse   . Type II diabetes mellitus [facility])     Patient Active Problem List   Diagnosis Date Noted  . Unstable angina pectoris ([place]) 01/11/2016  . Hypertensive heart disease with heart failure ([place])   . Chronic combined systolic and diastolic heart failure ([place])   . Elevated troponin   . Chest pain 01/10/2016  . Coronary artery disease due to lipid rich plaque 01/10/2016  . Status post coronary artery stent placement   . CKD (chronic kidney disease), stage III   . Type 2 diabetes mellitus with diabetic nephropathy, without long-term current use of insulin ([place])   . Tobacco abuse   . Essential hypertension   . Hypercholesteremia   . NSTEMI (non-ST elevated myocardial infarction) ([place]) 12/22/2015  . Lightheadedness 08/22/2012  . Dehydration 08/22/2012    Past Surgical History:  Procedure Laterality Date  . CARDIAC CATHETERIZATION N/A 12/22/2015   Procedure: Left Heart Cath and Coronary Angiography;  Surgeon: Burnell Blanks, MD;  Location: [facility] [facility];  Service: Cardiovascular;   Laterality: N/A;  . CARDIAC CATHETERIZATION N/A 12/22/2015   Procedure: Coronary Stent Intervention;  Surgeon: Burnell Blanks, MD;  Location: [place] [facility];  Service: Cardiovascular;  Laterality: N/A;  . CARDIAC CATHETERIZATION N/A 01/11/2016   Procedure: Left Heart Cath and Coronary Angiography;  Surgeon: Sherren Mocha, MD;  Location: [place] [facility];  Service: Cardiovascular;  Laterality: N/A;  . CARDIAC CATHETERIZATION N/A 01/11/2016   Procedure: Intravascular Pressure Wire/FFR Study;  Surgeon: Sherren Mocha, MD;  Location: [place] [facility];  Service: Cardiovascular;  Laterality: N/A;  . CARDIAC CATHETERIZATION N/A 01/11/2016   Procedure: Coronary Stent Intervention;  Surgeon: Sherren Mocha, MD;  Location: [place] [facility];  Service: Cardiovascular;  Laterality: N/A;  . CORONARY ANGIOPLASTY WITH STENT PLACEMENT  12/22/2015       Home Medications    Prior to Admission medications   Medication Sig Start Date End Date Taking? Authorizing Provider  aspirin 81 MG chewable tablet Chew 1 tablet (81 mg total) by mouth daily. 01/13/16  Yes Brittainy Erie Noe, PA-C  atorvastatin (LIPITOR) 80 MG tablet Take 1 tablet (80 mg total) by mouth daily at 6 PM. 12/26/15  Yes Cheryln Manly, NP  carvedilol (COREG) 25 MG tablet Take 1 tablet (25 mg total) by mouth 2 (two) times daily with a meal. 12/26/15  Yes Rosalene Billings  Mancel Bale, NP  hydrALAZINE (APRESOLINE) 50 MG tablet Take 1 tablet (50 mg total) by mouth 3 (three) times daily. 01/20/16  Yes Brittainy Erie Noe, PA-C  isosorbide mononitrate (IMDUR) 30 MG 24 hr tablet Take 1 tablet (30 mg total) by mouth daily. 12/27/15  Yes Cheryln Manly, NP  metFORMIN (GLUCOPHAGE) 1000 MG tablet Take 1 tablet (1,000 mg total) by mouth 2 (two) times daily with a meal. 01/15/16  Yes Brittainy Erie Noe, PA-C  ticagrelor (BRILINTA) 90 MG TABS tablet Take 1 tablet (90 mg total) by mouth 2 (two) times daily. 01/12/16  Yes Brittainy Erie Noe, PA-C    nicotine (NICODERM CQ - DOSED IN MG/24 HOURS) 14 mg/24hr patch Place 1 patch (14 mg total) onto the skin daily. Patient not taking: Reported on 03/09/2016 01/13/16   Brittainy Erie Noe, PA-C  nitroGLYCERIN (NITROSTAT) 0.4 MG SL tablet Place 1 tablet (0.4 mg total) under the tongue every 5 (five) minutes x 3 doses as needed for chest pain. 12/26/15   Cheryln Manly, NP    Family History Family History  Problem Relation Age of Onset  . CAD Brother   . CVA Brother   . CAD Brother     Social History Social History  Substance Use Topics  . Smoking status: Current Every Day Smoker    Packs/day: 0.75    Years: 29.00    Types: Cigarettes  . Smokeless tobacco: Never Used  . Alcohol use 4.2 oz/week    7 Cans of beer per week     Allergies   Patient has no known allergies.   Review of Systems Review of Systems  Musculoskeletal:       Left hip  Neurological: Positive for syncope and headaches.  All other systems reviewed and are negative.    Physical Exam Updated Vital Signs BP 145/86   Pulse 89   Temp 98.4 F (36.9 C) (Oral)   Resp 24   SpO2 100%   Physical Exam  Constitutional: He is oriented to person, place, and time. He appears well-developed and well-nourished.  HENT:  Head: Normocephalic.    Right Ear: External ear normal.  Left Ear: External ear normal.  Nose: Nose normal.  Mouth/Throat: Oropharynx is clear and moist.  Eyes: Conjunctivae are normal.  Left eye anisocoria and strabismus (chronic)  Neck: Normal range of motion. Neck supple.  Cardiovascular: Normal rate, regular rhythm, normal heart sounds and intact distal pulses.   Pulmonary/Chest: Effort normal and breath sounds normal.  Abdominal: Soft. Bowel sounds are normal.  Musculoskeletal:       Left hip: He exhibits tenderness.  Neurological: He is alert and oriented to person, place, and time.  Skin: Skin is warm and dry.  Psychiatric: He has a normal mood and affect. His behavior is normal.  Judgment and thought content normal.  Nursing note and vitals reviewed.    ED Treatments / Results  Labs (all labs ordered are listed, but only abnormal results are displayed) Labs Reviewed  CBC WITH DIFFERENTIAL/PLATELET - Abnormal; Notable for the following:       Result Value   RBC 3.81 (*)    Hemoglobin 11.2 (*)    HCT 34.7 (*)    All other components within normal limits  COMPREHENSIVE METABOLIC PANEL - Abnormal; Notable for the following:    Glucose, Bld 147 (*)    BUN 28 (*)    Creatinine, Ser 1.91 (*)    Total Protein 6.4 (*)    Albumin 3.0 (*)  ALT 15 (*)    GFR calc non Af Amer 39 (*)    GFR calc Af Amer 46 (*)    All other components within normal limits  URINALYSIS, ROUTINE W REFLEX MICROSCOPIC - Abnormal; Notable for the following:    Glucose, UA 50 (*)    Protein, ur >=300 (*)    Bacteria, UA RARE (*)    All other components within normal limits  CBG MONITORING, ED - Abnormal; Notable for the following:    Glucose-Capillary 163 (*)    All other components within normal limits  TROPONIN I  POCT CBG (FASTING - GLUCOSE)-MANUAL ENTRY  I-STAT TROPOININ, ED    EKG  EKG Interpretation None     EKG:  HR 75.  No STEMI  Radiology Dg Chest 2 View  Result Date: 03/09/2016 CLINICAL DATA:  Syncope, fall onto concrete. EXAM: CHEST  2 VIEW COMPARISON:  01/10/2016 FINDINGS: Heart and mediastinal contours are within normal limits. No focal opacities or effusions. No acute bony abnormality. IMPRESSION: No active cardiopulmonary disease. Electronically Signed   By: Rolm Baptise M.D.   On: 03/09/2016 11:20   Ct Head Wo Contrast  Result Date: 03/09/2016 CLINICAL DATA:  Syncope EXAM: CT HEAD WITHOUT CONTRAST TECHNIQUE: Contiguous axial images were obtained from the base of the skull through the vertex without intravenous contrast. COMPARISON:  08/21/2012 FINDINGS: Brain: No acute intracranial abnormality. Specifically, no hemorrhage, hydrocephalus, mass lesion, acute  infarction, or significant intracranial injury. Vascular: No hyperdense vessel or unexpected calcification. Skull: No acute calvarial abnormality. Sinuses/Orbits: Visualized paranasal sinuses and mastoids clear. Orbital soft tissues unremarkable. Other: None IMPRESSION: No acute intracranial abnormality. Electronically Signed   By: Rolm Baptise M.D.   On: 03/09/2016 10:40   Dg Hip Unilat W Or Wo Pelvis 2-3 Views Left  Result Date: 03/09/2016 CLINICAL DATA:  Syncope, fall, left hip pain. EXAM: DG HIP (WITH OR WITHOUT PELVIS) 2-3V LEFT COMPARISON:  None. FINDINGS: Early spurring in the hip joints bilaterally. SI joints are symmetric and unremarkable. No acute bony abnormality. Specifically, no fracture, subluxation, or dislocation. Soft tissues are intact. IMPRESSION: No acute bony abnormality. Electronically Signed   By: Rolm Baptise M.D.   On: 03/09/2016 11:21    Procedures Procedures (including critical care time)  Medications Ordered in ED Medications  Tdap (BOOSTRIX) injection 0.5 mL (0.5 mLs Intramuscular Given 03/09/16 1156)  sodium chloride 0.9 % bolus 1,000 mL (0 mLs Intravenous Stopped 03/09/16 1236)  sodium chloride 0.9 % bolus 1,000 mL (0 mLs Intravenous Stopped 03/09/16 1510)  ondansetron (ZOFRAN) injection 4 mg (4 mg Intravenous Given 03/09/16 1321)     Initial Impression / Assessment and Plan / ED Course  I have reviewed the triage vital signs and the nursing notes.  Pertinent labs & imaging results that were available during my care of the patient were reviewed by me and considered in my medical decision making (see chart for details).     Pt is feeling much better.  Pt is able to stand up with out feeling dizzy.  He is told to hold his coreg and brilinta tonight.  Return if worse.  F/u with pcp.  Final Clinical Impressions(s) / ED Diagnoses   Final diagnoses:  Orthostatic hypotension  Contusion of scalp, initial encounter  Injury of head, initial encounter    New  Prescriptions New Prescriptions   No medications on file     Isla Pence, MD 03/09/16 1523

## 2016-03-09 NOTE — ED Notes (Signed)
Patient undressed, in gown, on monitor, continuous pulse oximetry and blood pressure cuff 

## 2016-03-09 NOTE — ED Triage Notes (Signed)
Pt here from work where he passed out getting into a van , pos loc , pt had a lac to the head bleeding controlled

## 2016-03-09 NOTE — ED Notes (Signed)
CBG 163 

## 2016-03-09 NOTE — ED Notes (Signed)
heada washed with sdoap and water and bacitracin oint applied

## 2016-03-09 NOTE — ED Notes (Signed)
Orthostatics  Cancelled by dr Gilford Raid

## 2016-03-12 NOTE — Telephone Encounter (Signed)
Returned pts call and he has been advised not to stop his bp meds and to continue with the Brilinta.

## 2016-03-12 NOTE — Telephone Encounter (Signed)
New Message     Pt was taken to the Er 03/10/16 because he passed out, they ER said he is dehydrated and his bp was low, they advised him to stop taking the bp medicines, he was treated at [facility]

## 2016-03-13 ENCOUNTER — Ambulatory Visit (INDEPENDENT_AMBULATORY_CARE_PROVIDER_SITE_OTHER): Payer: BC Managed Care – PPO | Admitting: Physician Assistant

## 2016-03-13 ENCOUNTER — Encounter: Payer: Self-pay | Admitting: Physician Assistant

## 2016-03-13 VITALS — BP 170/82 | HR 84 | Ht 65.0 in | Wt 135.0 lb

## 2016-03-13 DIAGNOSIS — R55 Syncope and collapse: Secondary | ICD-10-CM | POA: Insufficient documentation

## 2016-03-13 DIAGNOSIS — I255 Ischemic cardiomyopathy: Secondary | ICD-10-CM

## 2016-03-13 DIAGNOSIS — I5042 Chronic combined systolic (congestive) and diastolic (congestive) heart failure: Secondary | ICD-10-CM

## 2016-03-13 DIAGNOSIS — I2583 Coronary atherosclerosis due to lipid rich plaque: Secondary | ICD-10-CM

## 2016-03-13 DIAGNOSIS — I10 Essential (primary) hypertension: Secondary | ICD-10-CM

## 2016-03-13 DIAGNOSIS — Z720 Tobacco use: Secondary | ICD-10-CM

## 2016-03-13 DIAGNOSIS — I2510 Atherosclerotic heart disease of native coronary artery without angina pectoris: Secondary | ICD-10-CM

## 2016-03-13 HISTORY — DX: Syncope and collapse: R55

## 2016-03-13 HISTORY — DX: Ischemic cardiomyopathy: I25.5

## 2016-03-13 MED ORDER — HYDRALAZINE HCL 25 MG PO TABS: 25.0000 mg | ORAL_TABLET | Freq: Three times a day (TID) | ORAL | 11 refills | Status: DC

## 2016-03-13 NOTE — Patient Instructions (Addendum)
Medication Instructions:  1) DECREASE HYDRALAZINE to 25 mg three times daily  Labwork: None  Testing/Procedures: None  Follow-Up: You have an appointment scheduled with Dr. Angelena Form 03/30/16 at 12:15PM.  Any Other Special Instructions Will Be Listed Below (If Applicable). PLEASE LIMIT YOUR CAFFEINE.  Steps to Quit Smoking Smoking tobacco can be bad for your health. It can also affect almost every organ in your body. Smoking puts you and people around you at risk for many serious long-lasting (chronic) diseases. Quitting smoking is hard, but it is one of the best things that you can do for your health. It is never too late to quit. What are the benefits of quitting smoking? When you quit smoking, you lower your risk for getting serious diseases and conditions. They can include:  Lung cancer or lung disease.  Heart disease.  Stroke.  Heart attack.  Not being able to have children (infertility).  Weak bones (osteoporosis) and broken bones (fractures). If you have coughing, wheezing, and shortness of breath, those symptoms may get better when you quit. You may also get sick less often. If you are pregnant, quitting smoking can help to lower your chances of having a baby of low birth weight. What can I do to help me quit smoking? Talk with your doctor about what can help you quit smoking. Some things you can do (strategies) include:  Quitting smoking totally, instead of slowly cutting back how much you smoke over a period of time.  Going to in-person counseling. You are more likely to quit if you go to many counseling sessions.  Using resources and support systems, such as:  Online chats with a Social worker.  Phone quitlines.  Printed Furniture conservator/restorer.  Support groups or group counseling.  Text messaging programs.  Mobile phone apps or applications.  Taking medicines. Some of these medicines may have nicotine in them. If you are pregnant or breastfeeding, do not take  any medicines to quit smoking unless your doctor says it is okay. Talk with your doctor about counseling or other things that can help you. Talk with your doctor about using more than one strategy at the same time, such as taking medicines while you are also going to in-person counseling. This can help make quitting easier. What things can I do to make it easier to quit? Quitting smoking might feel very hard at first, but there is a lot that you can do to make it easier. Take these steps:  Talk to your family and friends. Ask them to support and encourage you.  Call phone quitlines, reach out to support groups, or work with a Social worker.  Ask people who smoke to not smoke around you.  Avoid places that make you want (trigger) to smoke, such as:  Bars.  Parties.  Smoke-break areas at work.  Spend time with people who do not smoke.  Lower the stress in your life. Stress can make you want to smoke. Try these things to help your stress:  Getting regular exercise.  Deep-breathing exercises.  Yoga.  Meditating.  Doing a body scan. To do this, close your eyes, focus on one area of your body at a time from head to toe, and notice which parts of your body are tense. Try to relax the muscles in those areas.  Download or buy apps on your mobile phone or tablet that can help you stick to your quit plan. There are many free apps, such as QuitGuide from the [employer] Office manager for Disease Control and  Prevention). You can find more support from smokefree.gov and other websites. This information is not intended to replace advice given to you by your health care provider. Make sure you discuss any questions you have with your health care provider. Document Released: 11/18/2008 Document Revised: 09/20/2015 Document Reviewed: 06/08/2014 Elsevier Interactive Patient Education  2017 [employer].    If you need a refill on your cardiac medications before your next appointment, please call your  pharmacy.

## 2016-03-13 NOTE — Progress Notes (Signed)
Cardiology Office Note    Date:  03/13/2016   ID:  Jeffery Young, DOB 05-Apr-1965, MRN [id number]  PCP:  Andria Frames, MD  Cardiologist: Dr.McAlhany  No chief complaint on file.   History of Present Illness:  Jeffery Young is a 51 y.o. male with a history of hypertension, hyperlipidemia, diabetes type 2, ongoing tobacco abuse and strong family history of CAD. He had a NSTEMI 12/2015 Treated with DES to the circumflex and by mouth PA to the OM 2. LVEF was third of the 35% with akinesis of the inferior wall and grade 2 DD. He was fitted for a LifeVest.  Readmitted to the hospital 01/10/16 with unstable angina and cath showed patent proximal and mid circumflex stents with severe stenosis in the distal circumflex into the left PDA treated with DES. Nonobstructive LAD and total small diagonal with left to left collaterals. Mild to moderate stenosis of a small nondominant RCA and moderate stenosis at the ostium of the second OM branch.   Patient saw Ellen Henri, [place] 03/01/16 and she ordered a limited echo to assess his LV function. LVEF was 55-60% with grade 1 DD. He was told to return his LifeVest, and he could return to work.  Patient went back to work and was out smoking a cigarette when he had a syncopal episode. He says he can't remember the events surrounding this but was told in the emergency room his blood pressures dropped. He was given fluids and told to hold his Coreg and Brilinta that night. CT scan was negative for acute injury.  Patient denied any chest pain, palpitations. He did have some dizziness after taking his medications and thought he might be overmedicated.      Past Medical History:  Diagnosis Date  . Coronary artery disease    a.12/22/15: NSTEMI s/p overlapping DES x2 and balloon angioplasty to distal AV groove Circumflex (too small for a stent).   . Depression   . Hypercholesteremia   . Hypertension   . Prostate infection   . Tobacco abuse   . Type II  diabetes mellitus ([place])     Past Surgical History:  Procedure Laterality Date  . CARDIAC CATHETERIZATION N/A 12/22/2015   Procedure: Left Heart Cath and Coronary Angiography;  Surgeon: Burnell Blanks, MD;  Location: [facility];  Service: Cardiovascular;  Laterality: N/A;  . CARDIAC CATHETERIZATION N/A 12/22/2015   Procedure: Coronary Stent Intervention;  Surgeon: Burnell Blanks, MD;  Location: [facility];  Service: Cardiovascular;  Laterality: N/A;  . CARDIAC CATHETERIZATION N/A 01/11/2016   Procedure: Left Heart Cath and Coronary Angiography;  Surgeon: Sherren Mocha, MD;  Location: [facility];  Service: Cardiovascular;  Laterality: N/A;  . CARDIAC CATHETERIZATION N/A 01/11/2016   Procedure: Intravascular Pressure Wire/FFR Study;  Surgeon: Sherren Mocha, MD;  Location: [facility];  Service: Cardiovascular;  Laterality: N/A;  . CARDIAC CATHETERIZATION N/A 01/11/2016   Procedure: Coronary Stent Intervention;  Surgeon: Sherren Mocha, MD;  Location: [facility];  Service: Cardiovascular;  Laterality: N/A;  . CORONARY ANGIOPLASTY WITH STENT PLACEMENT  12/22/2015    Current Medications: Outpatient Medications Prior to Visit  Medication Sig Dispense Refill  . aspirin 81 MG chewable tablet Chew 1 tablet (81 mg total) by mouth daily.    Marland Kitchen atorvastatin (LIPITOR) 80 MG tablet Take 1 tablet (80 mg total) by mouth daily at 6 PM. 30 tablet 6  . carvedilol (COREG) 25 MG tablet Take 1 tablet (25  mg total) by mouth 2 (two) times daily with a meal. 60 tablet 6  . isosorbide mononitrate (IMDUR) 30 MG 24 hr tablet Take 1 tablet (30 mg total) by mouth daily. 30 tablet 6  . metFORMIN (GLUCOPHAGE) 1000 MG tablet Take 1 tablet (1,000 mg total) by mouth 2 (two) times daily with a meal.    . nitroGLYCERIN (NITROSTAT) 0.4 MG SL tablet Place 1 tablet (0.4 mg total) under the tongue every 5 (five) minutes x 3 doses as needed for chest pain. 25 tablet 3  .  ticagrelor (BRILINTA) 90 MG TABS tablet Take 1 tablet (90 mg total) by mouth 2 (two) times daily. 60 tablet 10  . hydrALAZINE (APRESOLINE) 50 MG tablet Take 1 tablet (50 mg total) by mouth 3 (three) times daily. 90 tablet 9  . nicotine (NICODERM CQ - DOSED IN MG/24 HOURS) 14 mg/24hr patch Place 1 patch (14 mg total) onto the skin daily. (Patient not taking: Reported on 03/13/2016) 28 patch 0   No facility-administered medications prior to visit.      Allergies:   Patient has no known allergies.   Social History   Social History  . Marital status: Married    Spouse name: N/A  . Number of children: N/A  . Years of education: N/A   Occupational History  . [employer]   Social History Main Topics  . Smoking status: Current Every Day Smoker    Packs/day: 0.75    Years: 29.00    Types: Cigarettes  . Smokeless tobacco: Never Used  . Alcohol use 4.2 oz/week    7 Cans of beer per week  . Drug use: No  . Sexual activity: Not Currently   Other Topics Concern  . None   Social History Narrative  . None     Family History:  The patient's family history includes CAD in his brother and brother; CVA in his brother.   ROS:   Please see the history of present illness.    Review of Systems  Constitution: Negative.  HENT: Negative.   Cardiovascular: Negative.   Respiratory: Negative.   Endocrine: Negative.   Hematologic/Lymphatic: Negative.   Musculoskeletal: Negative.   Gastrointestinal: Negative.   Genitourinary: Negative.   Neurological: Negative.    All other systems reviewed and are negative.   PHYSICAL EXAM:   VS:  BP (!) 170/82   Pulse 84   Ht 5\' 5"  (1.651 m)   Wt 135 lb (61.2 kg)   BMI 22.47 kg/m   Physical Exam  GEN: Well nourished, well developed, in no acute distress  Neck: no JVD, carotid bruits, or masses Cardiac:RRR; positive S4 no murmurs, rubs  Respiratory:  clear to auscultation bilaterally, normal work of breathing GI: soft, nontender,  nondistended, + BS Ext: without cyanosis, clubbing, or edema, Good distal pulses bilaterally Psych: euthymic mood, full affect  Wt Readings from Last 3 Encounters:  03/13/16 135 lb (61.2 kg)  03/01/16 131 lb (59.4 kg)  01/20/16 131 lb (59.4 kg)      Studies/Labs Reviewed:   EKG:  EKG is not ordered today.  EKG from 03/09/16 normal sinus rhythm with anterior wall MI, old and nonspecific ST-T wave changes, no acute EKG changes. EKG from 03/11/16 normal sinus rhythm with old anteroseptal MI, no acute change Recent Labs: 12/22/2015: TSH 0.920 03/09/2016: ALT 15; BUN 28; Creatinine, Ser 1.91; Hemoglobin 11.2; Platelets 251; Potassium 4.7; Sodium 136   Lipid Panel    Component Value Date/Time  CHOL 191 02/14/2016 1020   TRIG 184 (H) 02/14/2016 1020   HDL 52 02/14/2016 1020   CHOLHDL 3.7 02/14/2016 1020   CHOLHDL 3.9 12/23/2015 0336   VLDL 18 12/23/2015 0336   LDLCALC 102 (H) 02/14/2016 1020    Additional studies/ records that were reviewed today include:  Limited echo 03/01/16 Study Conclusions   - Left ventricle: The cavity size was normal. Wall thickness was   normal. Systolic function was normal. The estimated ejection   fraction was in the range of 55% to 60%. Doppler parameters are   consistent with abnormal left ventricular relaxation (grade 1   diastolic dysfunction).   Cardiac catheterization 12/6/17Conclusion  1. Continued patency of the stented segments throughout the proximal and mid circumflex 2. Severe stenosis of the distal circumflex into the left PDA treated successfully with a 2.25 x 15 mm DES 3. Nonobstructive LAD stenosis with total occlusion of the small diagonal branch filled by left to left collaterals 4. Mild to moderate stenosis of a small nondominant RCA 5. Moderate stenosis at the ostium of the second OM branch with negative pressure wire evaluation   Continue ASA/brilinta, secondary risk reduction measures       ASSESSMENT:    1. Coronary artery  disease due to lipid rich plaque   2. Ischemic cardiomyopathy   3. Syncope, unspecified syncope type   4. Essential hypertension   5. Chronic combined systolic and diastolic heart failure ([place])   6. Tobacco abuse      PLAN:  In order of problems listed above:  CAD status post recent stents in November and December as discussed above. No further angina  Ischemic cardiomyopathy EF improved to 50-55% on 2-D echo last week. LifeVest returned.   Episode of syncope one week later. Suspect it was orthostatic from reading the records from the emergency room. Patient's blood pressure does drop when necessary the office today. He has baseline hypertension 176/84 drops to 133/76 with standing. We'll decrease increase hydralazine to 25 mg 3 times a day. Asked him to cut back on his caffeine and smoking. Follow-up with Dr. Angelena Form Feb 23.  Essential hypertension baseline blood pressure elevated but he is orthostatic. Adjusting medications.  Chronic combined systolic and diastolic heart failure compensated  Tobacco abuse smoking cessation recommended   Medication Adjustments/Labs and Tests Ordered: Current medicines are reviewed at length with the patient today.  Concerns regarding medicines are outlined above.  Medication changes, Labs and Tests ordered today are listed in the Patient Instructions below. Patient Instructions  Medication Instructions:  1) DECREASE HYDRALAZINE to 25 mg three times daily  Labwork: None  Testing/Procedures: None  Follow-Up: You have an appointment scheduled with Dr. Angelena Form 03/30/16 at 12:15PM.  Any Other Special Instructions Will Be Listed Below (If Applicable). PLEASE LIMIT YOUR CAFFEINE.  Steps to Quit Smoking Smoking tobacco can be bad for your health. It can also affect almost every organ in your body. Smoking puts you and people around you at risk for many serious long-lasting (chronic) diseases. Quitting smoking is hard, but it is one of the best  things that you can do for your health. It is never too late to quit. What are the benefits of quitting smoking? When you quit smoking, you lower your risk for getting serious diseases and conditions. They can include:  Lung cancer or lung disease.  Heart disease.  Stroke.  Heart attack.  Not being able to have children (infertility).  Weak bones (osteoporosis) and broken bones (fractures). If  you have coughing, wheezing, and shortness of breath, those symptoms may get better when you quit. You may also get sick less often. If you are pregnant, quitting smoking can help to lower your chances of having a baby of low birth weight. What can I do to help me quit smoking? Talk with your doctor about what can help you quit smoking. Some things you can do (strategies) include:  Quitting smoking totally, instead of slowly cutting back how much you smoke over a period of time.  Going to in-person counseling. You are more likely to quit if you go to many counseling sessions.  Using resources and support systems, such as:  Online chats with a Social worker.  Phone quitlines.  Printed Furniture conservator/restorer.  Support groups or group counseling.  Text messaging programs.  Mobile phone apps or applications.  Taking medicines. Some of these medicines may have nicotine in them. If you are pregnant or breastfeeding, do not take any medicines to quit smoking unless your doctor says it is okay. Talk with your doctor about counseling or other things that can help you. Talk with your doctor about using more than one strategy at the same time, such as taking medicines while you are also going to in-person counseling. This can help make quitting easier. What things can I do to make it easier to quit? Quitting smoking might feel very hard at first, but there is a lot that you can do to make it easier. Take these steps:  Talk to your family and friends. Ask them to support and encourage you.  Call phone  quitlines, reach out to support groups, or work with a Social worker.  Ask people who smoke to not smoke around you.  Avoid places that make you want (trigger) to smoke, such as:  Bars.  Parties.  Smoke-break areas at work.  Spend time with people who do not smoke.  Lower the stress in your life. Stress can make you want to smoke. Try these things to help your stress:  Getting regular exercise.  Deep-breathing exercises.  Yoga.  Meditating.  Doing a body scan. To do this, close your eyes, focus on one area of your body at a time from head to toe, and notice which parts of your body are tense. Try to relax the muscles in those areas.  Download or buy apps on your mobile phone or tablet that can help you stick to your quit plan. There are many free apps, such as QuitGuide from the [employer] Office manager for Disease Control and Prevention). You can find more support from smokefree.gov and other websites. This information is not intended to replace advice given to you by your health care provider. Make sure you discuss any questions you have with your health care provider. Document Released: 11/18/2008 Document Revised: 09/20/2015 Document Reviewed: 06/08/2014 Elsevier Interactive Patient Education  2017 [employer].    If you need a refill on your cardiac medications before your next appointment, please call your pharmacy.      Signed, Ermalinda Barrios, PA-C  03/13/2016 1:56 PM    [facility] [place], [place], [place]  [address] Phone: [phone]; Fax: [phone]

## 2016-03-30 ENCOUNTER — Encounter (INDEPENDENT_AMBULATORY_CARE_PROVIDER_SITE_OTHER): Payer: Self-pay

## 2016-03-30 ENCOUNTER — Ambulatory Visit (INDEPENDENT_AMBULATORY_CARE_PROVIDER_SITE_OTHER): Payer: BC Managed Care – PPO | Admitting: Cardiovascular Disease

## 2016-03-30 ENCOUNTER — Encounter: Payer: Self-pay | Admitting: Cardiovascular Disease

## 2016-03-30 VITALS — BP 160/90 | HR 88 | Ht 65.0 in | Wt 130.4 lb

## 2016-03-30 DIAGNOSIS — I255 Ischemic cardiomyopathy: Secondary | ICD-10-CM

## 2016-03-30 DIAGNOSIS — I2510 Atherosclerotic heart disease of native coronary artery without angina pectoris: Secondary | ICD-10-CM | POA: Diagnosis not present

## 2016-03-30 DIAGNOSIS — I10 Essential (primary) hypertension: Secondary | ICD-10-CM

## 2016-03-30 MED ORDER — CLOPIDOGREL BISULFATE 75 MG PO TABS: 75.0000 mg | ORAL_TABLET | Freq: Every day | ORAL | 11 refills | Status: DC

## 2016-03-30 NOTE — Patient Instructions (Signed)
Medication Instructions:  Your physician has recommended you make the following change in your medication:  Stop Brilinta. Start Clopidogrel 75 mg by mouth daily.   Labwork: none  Testing/Procedures: none  Follow-Up: Your physician recommends that you schedule a follow-up appointment in: 6 months.  Please call our office in about 3 months to schedule this appointment.    Any Other Special Instructions Will Be Listed Below (If Applicable).     If you need a refill on your cardiac medications before your next appointment, please call your pharmacy.

## 2016-03-30 NOTE — Progress Notes (Signed)
Chief Complaint  Patient presents with  . Follow-up     History of Present Illness: 51 yo male with h/o HTN, HLD, DM, tobacco abuse and CAD who is here today for cardiac follow up. He was admitted to [facility] November 2017 with a NSTEMI. 2 Promus drug eluting stents were placed in the mid Circumflex. He was readmitted December 2017 with unstable angina and another DES was placed in the distal Circumflex into the left PDA. FFR of the OM2 branch was ok. Echo January 2018 with LVEF=55-60%. He was smoking a cigarette at work 03/09/16 and passed out. He was seen in the ED at [facility]. He was felt to be orthostatic. Hydralazine dose reduced.   He is here today for follow up. He has has been feeling better since he was seen in the ED. No chest pain. He has had dyspnea at night after taking Brilinta.   Primary Care Physician: Andria Frames, MD   Past Medical History:  Diagnosis Date  . Coronary artery disease    a.12/22/15: NSTEMI s/p overlapping DES x2 and balloon angioplasty to distal AV groove Circumflex (too small for a stent).   . Depression   . Hypercholesteremia   . Hypertension   . Prostate infection   . Tobacco abuse   . Type II diabetes mellitus ([place])     Past Surgical History:  Procedure Laterality Date  . CARDIAC CATHETERIZATION N/A 12/22/2015   Procedure: Left Heart Cath and Coronary Angiography;  Surgeon: Burnell Blanks, MD;  Location: [facility];  Service: Cardiovascular;  Laterality: N/A;  . CARDIAC CATHETERIZATION N/A 12/22/2015   Procedure: Coronary Stent Intervention;  Surgeon: Burnell Blanks, MD;  Location: [facility];  Service: Cardiovascular;  Laterality: N/A;  . CARDIAC CATHETERIZATION N/A 01/11/2016   Procedure: Left Heart Cath and Coronary Angiography;  Surgeon: Sherren Mocha, MD;  Location: [facility];  Service: Cardiovascular;  Laterality: N/A;  . CARDIAC CATHETERIZATION N/A 01/11/2016   Procedure: Intravascular Pressure Wire/FFR  Study;  Surgeon: Sherren Mocha, MD;  Location: [place] [facility];  Service: Cardiovascular;  Laterality: N/A;  . CARDIAC CATHETERIZATION N/A 01/11/2016   Procedure: Coronary Stent Intervention;  Surgeon: Sherren Mocha, MD;  Location: [facility];  Service: Cardiovascular;  Laterality: N/A;  . CORONARY ANGIOPLASTY WITH STENT PLACEMENT  12/22/2015    Current Outpatient Prescriptions  Medication Sig Dispense Refill  . aspirin 81 MG chewable tablet Chew 1 tablet (81 mg total) by mouth daily.    Marland Kitchen atorvastatin (LIPITOR) 80 MG tablet Take 1 tablet (80 mg total) by mouth daily at 6 PM. 30 tablet 6  . carvedilol (COREG) 25 MG tablet Take 1 tablet (25 mg total) by mouth 2 (two) times daily with a meal. 60 tablet 6  . hydrALAZINE (APRESOLINE) 25 MG tablet Take 1 tablet (25 mg total) by mouth 3 (three) times daily. 90 tablet 11  . isosorbide mononitrate (IMDUR) 30 MG 24 hr tablet Take 1 tablet (30 mg total) by mouth daily. 30 tablet 6  . metFORMIN (GLUCOPHAGE) 1000 MG tablet Take 1 tablet (1,000 mg total) by mouth 2 (two) times daily with a meal.    . nitroGLYCERIN (NITROSTAT) 0.4 MG SL tablet Place 1 tablet (0.4 mg total) under the tongue every 5 (five) minutes x 3 doses as needed for chest pain. 25 tablet 3  . clopidogrel (PLAVIX) 75 MG tablet Take 1 tablet (75 mg total) by mouth daily. 30 tablet 11  No current facility-administered medications for this visit.     No Known Allergies  Social History   Social History  . Marital status: Married    Spouse name: N/A  . Number of children: N/A  . Years of education: N/A   Occupational History  . [employer]   Social History Main Topics  . Smoking status: Current Every Day Smoker    Packs/day: 0.75    Years: 29.00    Types: Cigarettes  . Smokeless tobacco: Never Used  . Alcohol use 4.2 oz/week    7 Cans of beer per week  . Drug use: No  . Sexual activity: Not Currently   Other Topics Concern  . Not on file    Social History Narrative  . No narrative on file    Family History  Problem Relation Age of Onset  . CAD Brother   . CVA Brother   . CAD Brother     Review of Systems:  As stated in the HPI and otherwise negative.   BP (!) 160/90   Pulse 88   Ht 5\' 5"  (1.651 m)   Wt 130 lb 6.4 oz (59.1 kg)   BMI 21.70 kg/m   Physical Examination: General: Well developed, well nourished, NAD  HEENT: OP clear, mucus membranes moist  SKIN: warm, dry. No rashes. Neuro: No focal deficits  Musculoskeletal: Muscle strength 5/5 all ext  Psychiatric: Mood and affect normal  Neck: No JVD, no carotid bruits, no thyromegaly, no lymphadenopathy.  Lungs:Clear bilaterally, no wheezes, rhonci, crackles Cardiovascular: Regular rate and rhythm. No murmurs, gallops or rubs. Abdomen:Soft. Bowel sounds present. Non-tender.  Extremities: No lower extremity edema. Pulses are 2 + in the bilateral DP/PT.  Echo January 2018:  Left ventricle: The cavity size was normal. Wall thickness was   normal. Systolic function was normal. The estimated ejection   fraction was in the range of 55% to 60%. Doppler parameters are   consistent with abnormal left ventricular relaxation (grade 1   diastolic dysfunction).  EKG:  EKG is not ordered today. The ekg ordered today demonstrates   Recent Labs: 12/22/2015: TSH 0.920 03/09/2016: ALT 15; BUN 28; Creatinine, Ser 1.91; Hemoglobin 11.2; Platelets 251; Potassium 4.7; Sodium 136   Lipid Panel    Component Value Date/Time   CHOL 191 02/14/2016 1020   TRIG 184 (H) 02/14/2016 1020   HDL 52 02/14/2016 1020   CHOLHDL 3.7 02/14/2016 1020   CHOLHDL 3.9 12/23/2015 0336   VLDL 18 12/23/2015 0336   LDLCALC 102 (H) 02/14/2016 1020     Wt Readings from Last 3 Encounters:  03/30/16 130 lb 6.4 oz (59.1 kg)  03/13/16 135 lb (61.2 kg)  03/01/16 131 lb (59.4 kg)     Other studies Reviewed: Additional studies/ records that were reviewed today include: . Review of the above  records demonstrates:   Assessment and Plan:   1. CAD without angina: NO chest pain suggestive of angina. Will change Brilinta to Plavix given dyspnea. Continue ASA, statin, beta blocker, Imdur  2. Ischemic cardiomyopathy: LVEF normal by echo January 2017. Continue current meds.   3. HTN: BP is slightly elevated today. He will follow at home.   Current medicines are reviewed at length with the patient today.  The patient does not have concerns regarding medicines.  The following changes have been made:  no change  Labs/ tests ordered today include:  No orders of the defined types were placed in this encounter.  Disposition:   FU with me in 6 months  Signed, Lauree Chandler, MD 03/30/2016 1:18 PM    [facility] [place], [place], [place]  [address] Phone: [phone]; Fax: [phone]

## 2016-04-17 ENCOUNTER — Telehealth: Payer: Self-pay | Admitting: Cardiovascular Disease

## 2016-04-17 NOTE — Telephone Encounter (Signed)
°*  STAT* If patient is at the pharmacy, call can be transferred to refill team.   1. Which medications need to be refilled? (please list name of each medication and dose if known) Asprin  2. Which pharmacy/location (including street and city if local pharmacy) is medication to be sent to?  [facility]   3. Do they need a 30 day or 90 day supply? [place]

## 2016-04-17 NOTE — Telephone Encounter (Signed)
informed pt that we do not fill ASA 81, its a OTC medication, pt expressed understanding.

## 2016-05-24 ENCOUNTER — Encounter: Payer: Self-pay | Admitting: Cardiovascular Disease

## 2016-06-06 ENCOUNTER — Emergency Department (HOSPITAL_COMMUNITY)
Admission: EM | Admit: 2016-06-06 | Discharge: 2016-06-06 | Discharge status: Absent without leave | Payer: BC Managed Care – PPO

## 2016-06-06 NOTE — ED Triage Notes (Signed)
Called x 2 NO answer in lobby

## 2016-06-14 ENCOUNTER — Encounter (INDEPENDENT_AMBULATORY_CARE_PROVIDER_SITE_OTHER): Payer: Self-pay

## 2016-06-14 ENCOUNTER — Encounter: Payer: Self-pay | Admitting: Cardiovascular Disease

## 2016-06-14 ENCOUNTER — Ambulatory Visit (INDEPENDENT_AMBULATORY_CARE_PROVIDER_SITE_OTHER): Payer: BC Managed Care – PPO | Admitting: Cardiovascular Disease

## 2016-06-14 VITALS — BP 162/82 | HR 84 | Ht 65.0 in | Wt 130.8 lb

## 2016-06-14 DIAGNOSIS — I10 Essential (primary) hypertension: Secondary | ICD-10-CM

## 2016-06-14 DIAGNOSIS — I2510 Atherosclerotic heart disease of native coronary artery without angina pectoris: Secondary | ICD-10-CM | POA: Diagnosis not present

## 2016-06-14 DIAGNOSIS — Z720 Tobacco use: Secondary | ICD-10-CM | POA: Diagnosis not present

## 2016-06-14 DIAGNOSIS — I255 Ischemic cardiomyopathy: Secondary | ICD-10-CM

## 2016-06-14 MED ORDER — HYDRALAZINE HCL 50 MG PO TABS: 50.0000 mg | ORAL_TABLET | Freq: Three times a day (TID) | ORAL | 6 refills | Status: DC

## 2016-06-14 NOTE — Patient Instructions (Signed)
Medication Instructions:  Your physician has recommended you make the following change in your medication:  Increase hydralazine to 50 mg by mouth three times daily   Labwork: none  Testing/Procedures: none  Follow-Up: Your physician recommends that you schedule a follow-up appointment in: 6 months. Please call our office in about 3 months to schedule this appointment    Any Other Special Instructions Will Be Listed Below (If Applicable).     If you need a refill on your cardiac medications before your next appointment, please call your pharmacy.

## 2016-06-14 NOTE — Progress Notes (Signed)
Chief Complaint  Patient presents with  . Follow-up    CAD     History of Present Illness: 51 yo male with history of CAD, HTN, HLD, DM and tobacco abuse here today for follow p. He had a NSTEMI in November 2017. 2 Promus DES placed in the Circumflex and in December 2017 another DES was placed in the distal Circumflex. FFR of OM branch was ok. Echo January 2018 with LVEF=55-60%. He was smoking a cigarette at work 03/09/16 and passed out. He was seen in the ED at [facility]. He was felt to be orthostatic. Hydralazine dose reduced. He had dyspnea with Brilinta and this was changed to Plavix.   He is here today for follow up. The patient denies any chest pain, dyspnea, palpitations, lower extremity edema, orthopnea, PND, dizziness, near syncope or syncope.   Primary Care Physician: [name], MD   Past Medical History:  Diagnosis Date  . Coronary artery disease    a.12/22/15: NSTEMI s/p overlapping DES x2 and balloon angioplasty to distal AV groove Circumflex (too small for a stent).   . Depression   . Hypercholesteremia   . Hypertension   . Prostate infection   . Tobacco abuse   . Type II diabetes mellitus ([place])     Past Surgical History:  Procedure Laterality Date  . CARDIAC CATHETERIZATION N/A 12/22/2015   Procedure: Left Heart Cath and Coronary Angiography;  Surgeon: Burnell Blanks, MD;  Location: [place] [facility];  Service: Cardiovascular;  Laterality: N/A;  . CARDIAC CATHETERIZATION N/A 12/22/2015   Procedure: Coronary Stent Intervention;  Surgeon: Burnell Blanks, MD;  Location: [place] [facility];  Service: Cardiovascular;  Laterality: N/A;  . CARDIAC CATHETERIZATION N/A 01/11/2016   Procedure: Left Heart Cath and Coronary Angiography;  Surgeon: Sherren Mocha, MD;  Location: [place] [facility];  Service: Cardiovascular;  Laterality: N/A;  . CARDIAC CATHETERIZATION N/A 01/11/2016   Procedure: Intravascular Pressure Wire/FFR Study;  Surgeon: Sherren Mocha, MD;   Location: [place] [facility];  Service: Cardiovascular;  Laterality: N/A;  . CARDIAC CATHETERIZATION N/A 01/11/2016   Procedure: Coronary Stent Intervention;  Surgeon: Sherren Mocha, MD;  Location: [place] [facility];  Service: Cardiovascular;  Laterality: N/A;  . CORONARY ANGIOPLASTY WITH STENT PLACEMENT  12/22/2015    Current Outpatient Prescriptions  Medication Sig Dispense Refill  . aspirin 81 MG chewable tablet Chew 1 tablet (81 mg total) by mouth daily.    Marland Kitchen atorvastatin (LIPITOR) 80 MG tablet Take 1 tablet (80 mg total) by mouth daily at 6 PM. 30 tablet 6  . carvedilol (COREG) 25 MG tablet Take 1 tablet (25 mg total) by mouth 2 (two) times daily with a meal. 60 tablet 6  . clopidogrel (PLAVIX) 75 MG tablet Take 1 tablet (75 mg total) by mouth daily. 30 tablet 11  . isosorbide mononitrate (IMDUR) 30 MG 24 hr tablet Take 1 tablet (30 mg total) by mouth daily. 30 tablet 6  . metFORMIN (GLUCOPHAGE) 1000 MG tablet Take 1 tablet (1,000 mg total) by mouth 2 (two) times daily with a meal.    . nitroGLYCERIN (NITROSTAT) 0.4 MG SL tablet Place 1 tablet (0.4 mg total) under the tongue every 5 (five) minutes x 3 doses as needed for chest pain. 25 tablet 3  . hydrALAZINE (APRESOLINE) 50 MG tablet Take 1 tablet (50 mg total) by mouth 3 (three) times daily. 90 tablet 6   No current facility-administered medications for this visit.  No Known Allergies  Social History   Social History  . Marital status: Married    Spouse name: N/A  . Number of children: N/A  . Years of education: N/A   Occupational History  . [employer]   Social History Main Topics  . Smoking status: Current Every Day Smoker    Packs/day: 0.75    Years: 29.00    Types: Cigarettes  . Smokeless tobacco: Never Used  . Alcohol use 4.2 oz/week    7 Cans of beer per week  . Drug use: No  . Sexual activity: Not Currently   Other Topics Concern  . Not on file   Social History Narrative  . No  narrative on file    Family History  Problem Relation Age of Onset  . CAD Brother   . CVA Brother   . CAD Brother     Review of Systems:  As stated in the HPI and otherwise negative.   BP (!) 162/82   Pulse 84   Ht 5\' 5"  (1.651 m)   Wt 130 lb 12.8 oz (59.3 kg)   SpO2 95%   BMI 21.77 kg/m   Physical Examination: General: Well developed, well nourished, NAD  HEENT: OP clear, mucus membranes moist  SKIN: warm, dry. No rashes. Neuro: No focal deficits  Musculoskeletal: Muscle strength 5/5 all ext  Psychiatric: Mood and affect normal  Neck: No JVD, no carotid bruits, no thyromegaly, no lymphadenopathy.  Lungs:Clear bilaterally, no wheezes, rhonci, crackles Cardiovascular: Regular rate and rhythm. No murmurs, gallops or rubs. Abdomen:Soft. Bowel sounds present. Non-tender.  Extremities: No lower extremity edema. Pulses are 2 + in the bilateral DP/PT.  Echo January 2018:  Left ventricle: The cavity size was normal. Wall thickness was   normal. Systolic function was normal. The estimated ejection   fraction was in the range of 55% to 60%. Doppler parameters are   consistent with abnormal left ventricular relaxation (grade 1   diastolic dysfunction).  EKG:  EKG is not  ordered today. The ekg ordered today demonstrates   Recent Labs: 12/22/2015: TSH 0.920 03/09/2016: ALT 15; BUN 28; Creatinine, Ser 1.91; Hemoglobin 11.2; Platelets 251; Potassium 4.7; Sodium 136   Lipid Panel    Component Value Date/Time   CHOL 191 02/14/2016 1020   TRIG 184 (H) 02/14/2016 1020   HDL 52 02/14/2016 1020   CHOLHDL 3.7 02/14/2016 1020   CHOLHDL 3.9 12/23/2015 0336   VLDL 18 12/23/2015 0336   LDLCALC 102 (H) 02/14/2016 1020     Wt Readings from Last 3 Encounters:  06/14/16 130 lb 12.8 oz (59.3 kg)  03/30/16 130 lb 6.4 oz (59.1 kg)  03/13/16 135 lb (61.2 kg)     Other studies Reviewed: Additional studies/ records that were reviewed today include: . Review of the above records  demonstrates:   Assessment and Plan:   1. CAD with angina: His chest pain has been controlled on Imdur. Will continue ASA, Plavix, statin, beta blocker and Imdur.   2. Ischemic cardiomyopathy: LV function normal by echo January 2018.  This improved from 30% around the time of his MI.   3. HTN: BP elevated today and at home. Will increase Hydralazine to 50 mg po TID.    4. Tobacco abuse: Smoking cessation advised.   Current medicines are reviewed at length with the patient today.  The patient does not have concerns regarding medicines.  The following changes have been made:  no change  Labs/  tests ordered today include:  No orders of the defined types were placed in this encounter.   Disposition:   FU with me in 6 months  Signed, Lauree Chandler, MD 06/14/2016 9:46 AM    [facility] [address], [place], [place]  [address] Phone: [phone]; Fax: [phone]

## 2016-10-03 ENCOUNTER — Other Ambulatory Visit: Payer: Self-pay | Admitting: *Deleted

## 2016-10-03 MED ORDER — ATORVASTATIN CALCIUM 80 MG PO TABS: 80.0000 mg | ORAL_TABLET | Freq: Every day | ORAL | 6 refills | Status: DC

## 2016-11-16 ENCOUNTER — Ambulatory Visit: Payer: BC Managed Care – PPO | Admitting: Internal Medicine

## 2016-11-16 ENCOUNTER — Telehealth: Payer: Self-pay

## 2016-11-16 NOTE — Telephone Encounter (Signed)
Call received to scheduling, Pt states unable to keep appt today.  Pt scheduled to follow up in office next week.  Notified to go to ER if any chest pain, sob.

## 2016-11-16 NOTE — Telephone Encounter (Signed)
DOD call received from [facility].  Pt of Dr. Angelena Form arrived for assessment of left side numbness and hip pain.  Per nurse Pt BP 197/105 and would like to send Pt to be assessed.  Notified nurse ok to send Pt for DOD assessment.

## 2016-11-20 ENCOUNTER — Encounter: Payer: Self-pay | Admitting: Cardiology

## 2016-11-20 ENCOUNTER — Ambulatory Visit (INDEPENDENT_AMBULATORY_CARE_PROVIDER_SITE_OTHER): Payer: BC Managed Care – PPO | Admitting: Cardiology

## 2016-11-20 ENCOUNTER — Encounter (INDEPENDENT_AMBULATORY_CARE_PROVIDER_SITE_OTHER): Payer: Self-pay

## 2016-11-20 VITALS — BP 196/100 | HR 82 | Ht 65.0 in | Wt 129.8 lb

## 2016-11-20 DIAGNOSIS — Z720 Tobacco use: Secondary | ICD-10-CM | POA: Diagnosis not present

## 2016-11-20 DIAGNOSIS — I2510 Atherosclerotic heart disease of native coronary artery without angina pectoris: Secondary | ICD-10-CM | POA: Diagnosis not present

## 2016-11-20 DIAGNOSIS — I10 Essential (primary) hypertension: Secondary | ICD-10-CM

## 2016-11-20 NOTE — Patient Instructions (Signed)
Your physician recommends that you continue on your current medications as directed. Please refer to the Current Medication list given to you today.  Your physician recommends that you schedule a follow-up appointment in:  [place]

## 2016-11-20 NOTE — Progress Notes (Signed)
11/20/2016 Jeffery Young   1965/08/19  [id number]  Primary Physician Knox Royalty, MD Primary Cardiologist: Dr. Clifton James    Reason for Visit/CC: F/u for HTN and CAD  HPI:  Jeffery Young is a 51 y.o. male followed by Dr. Clifton James, with a h/o CAD, HTN, HLD, DM and tobacco use. He first presented Nov 2017 with NSTEMI and had a Promus DES placed in the Circumflex and in December 2017 another DES was placed in the distal Circumflex. He was also noted to have moderate stenosis of the proximal OM branch however FFR testing was normal. EF was initially low post MI at 30%. He was placed on appropriate medical therapy for systolic HF and fitted with a LifeVest for primary prevention of SCD. F/u Echo January 2018 showed improvement in systolic function with LVEF=55-60%. LifeVest discontinued. He has required medication adjustments due to side effects. Brilinta was stopped due to dyspnea. Plavix added. Hydralazine dose also had to be reduced due to orthostatic symptoms.   He presents to clinic today for management of BP. He was seen recently at his orthopedist office and BP was noted to be elevated at 195/105. Call was made to DOD, who advised that he be evaluated soon for medication management.   His BP today is elevated at 196/100. He is asymptomatic w/o CP, dyspnea, HA or visual changes. He admits that he has not yet taken his meds. He did not eat breakfast as he was unsure if he would need labs, thus he did not take any meds. He notes that he does this anytime he has a doctor's appt. He did not take his meds prior to going to the orthopedist the other day when BP was noted to be high.   There also seems to be some confusion regarding the doses/ frequency of meds. He has not been taking Coreg BID. Only once daily. He also often forgets to take his mid-day dose of hydralazine. He has only been taking BID instead of TID. He also is unsure if he is taking Imdur, although it is on his med list. Dr. Clifton James had  noted in his last note to continue on Imdur.   The patient also has other factors likely contributing to his HTN, including persistent tobacco use and moderate caffeine intake.   Current Meds  Medication Sig  . aspirin 81 MG chewable tablet Chew 1 tablet (81 mg total) by mouth daily.  . carvedilol (COREG) 25 MG tablet Take 1 tablet (25 mg total) by mouth 2 (two) times daily with a meal.  . clopidogrel (PLAVIX) 75 MG tablet Take 1 tablet (75 mg total) by mouth daily.  . hydrALAZINE (APRESOLINE) 50 MG tablet Take 1 tablet (50 mg total) by mouth 3 (three) times daily.  . metFORMIN (GLUCOPHAGE) 1000 MG tablet Take 1 tablet (1,000 mg total) by mouth 2 (two) times daily with a meal.  . nitroGLYCERIN (NITROSTAT) 0.4 MG SL tablet Place 1 tablet (0.4 mg total) under the tongue every 5 (five) minutes x 3 doses as needed for chest pain.  . simvastatin (ZOCOR) 20 MG tablet Take 20 mg by mouth daily.   No Known Allergies Past Medical History:  Diagnosis Date  . Coronary artery disease    a.12/22/15: NSTEMI s/p overlapping DES x2 and balloon angioplasty to distal AV groove Circumflex (too small for a stent).   . Depression   . Hypercholesteremia   . Hypertension   . Prostate infection   . Tobacco abuse   . Type II  diabetes mellitus ([facility])    Family History  Problem Relation Age of Onset  . CAD Brother   . CVA Brother   . CAD Brother    Past Surgical History:  Procedure Laterality Date  . CARDIAC CATHETERIZATION N/A 12/22/2015   Procedure: Left Heart Cath and Coronary Angiography;  Surgeon: Kathleene Hazel, MD;  Location: [facility] IN[facility]ASIVE [facility];  Service: Cardiovascular;  Laterality: N/A;  . CARDIAC CATHETERIZATION N/A 12/22/2015   Procedure: Coronary Stent Intervention;  Surgeon: Kathleene Hazel, MD;  Location: [facility] INVAS[facility];  Service: Cardiovascular;  Laterality: N/A;  . CARDIAC CATHETERIZATION N/A 01/11/2016   Procedure: Left Heart Cath and Coronary Angiography;  Surgeon:  Tonny Bollman, MD;  Location: [facility] INVASIVE CV LAB;  Service: Cardiovascular;  Laterality: N/A;  . CARDIAC CATHETERIZATION N/A 01/11/2016   Procedure: Intravascular Pressure Wire/FFR Study;  Surgeon: Tonny Bollman, MD;  Location: [facility] INVASIVE CV LAB;  Service: Cardiovascular;  Laterality: N/A;  . CARDIAC CATHETERIZATION N/A 01/11/2016   Procedure: Coronary Stent Intervention;  Surgeon: Tonny Bollman, MD;  Location: [facility] INVASIVE CV LAB;  Service: Cardiovascular;  Laterality: N/A;  . CORONARY ANGIOPLASTY WITH STENT PLACEMENT  12/22/2015   Social History   Social History  . Marital status: Married    Spouse name: N/A  . Number of children: N/A  . Years of education: N/A   Occupational History  . CUSTODIAN [employer]   Social History Main Topics  . Smoking status: Current Every Day Smoker    Packs/day: 0.75    Years: 29.00    Types: Cigarettes  . Smokeless tobacco: Never Used  . Alcohol use 4.2 oz/week    7 Cans of beer per week  . Drug use: No  . Sexual activity: Not Currently   Other Topics Concern  . Not on file   Social History Narrative  . No narrative on file     Review of Systems: General: negative for chills, fever, night sweats or weight changes.  Cardiovascular: negative for chest pain, dyspnea on exertion, edema, orthopnea, palpitations, paroxysmal nocturnal dyspnea or shortness of breath Dermatological: negative for rash Respiratory: negative for cough or wheezing Urologic: negative for hematuria Abdominal: negative for nausea, vomiting, diarrhea, bright red blood per rectum, melena, or hematemesis Neurologic: negative for visual changes, syncope, or dizziness All other systems reviewed and are otherwise negative except as noted above.   Physical Exam:  Blood pressure (!) 196/100, pulse 82, height 5\' 5"  (1.651 m), weight 129 lb 12.8 oz (58.9 kg), SpO2 96 %.  General appearance: alert, cooperative and no distress Neck: no carotid bruit and no JVD Lungs:  clear to auscultation bilaterally Heart: regular rate and rhythm, S1, S2 normal, no murmur, click, rub or gallop Extremities: extremities normal, atraumatic, no cyanosis or edema Pulses: 2+ and symmetric Skin: Skin color, texture, turgor normal. No rashes or lesions Neurologic: Grossly normal  EKG not performed -- personally reviewed   ASSESSMENT AND PLAN:   1. CAD: stable w/o CP. No dyspnea. Continue medical therapy with ASA, Plavix, BB, LA nitrate and statin. Smoking cessation advised.   2. HTN: elevated in clinic today at 196/100, however pt has not yet taken his BP meds. He also had 2 cups of coffee today and continues to smoke. Pt also taking meds incorrectly, only taking BID BB once daily and TID hydralazine BID. We discussed recommended drug frequency for effectiveness. He now knows to take Coreg BID and will try to improve compliance with hydralazine  to TID. Pt to continue on Imdur once daily. He was advised to be sure to take all of his meds prior to his next f/u appt so that we can better assess his response to his current prescribed regimen. We will make further adjustments if needed at that time. F/u in HTN clinic for repeat assessment. Pt also encouraged to cut back on smoking and reduce caffeine and limit salt intake. He was instructed to bring all of his home meds with him to his f/u with HTN clinic.   3. H/o Systolic HF: EF post MI in 2017 was 30% but improved to normal w/ medical management. F/u Echo 02/2016 showed normal LVEF. He denies dyspnea. Volume is stable.   4. Tobacco Abuse: active smoker. Smoking cessation advised.    Follow-Up in HTN clinic in 1-2 weeks for repeat assessment of BP and medication management. F/u with Dr. Clifton James in 6 months.   Lindey Renzulli Delmer Islam, MHS [facility] 11/20/2016 12:00 PM

## 2016-12-09 NOTE — Progress Notes (Deleted)
Patient ID: Jeffery Young                 DOB: [birth year]                      MRN: [id number]     HPI: Jeffery Young is a 51 y.o. male patient of Dr. Angelena Form referred by Lyda Jester, PA-C to HTN clinic. PMH is significant for CAD s/p PCI 2017, HTN, HLD, DM, HFrEF (EF 30% post-MI, improved to 55% 02/2016), MDD, CKD III, and tobacco abuse. Pt recently seen in clinic with BP 196/100 and no associated symptoms though he had not taken his medications that morning. At this visit pt appeared to be poorly compliant with medications and was taking carvedilol once daily and hydralazine only twice daily.  Pt presents to clinic ***  Current HTN meds: Carvedilol 25mg  BID, hydralazine 50mg  TID, Imdur ***  Previously tried: lisinopril 5mg  daily (AKI post-MI 2017)  BP goal: <130/80 mmHg  Family History: CAD (brother), CVA (brother)  Social History: 0.75 packs/day for 29 years, 7 cans of beer/week ***  Diet: ***  Exercise: ***  Home BP readings: ***  Wt Readings from Last 3 Encounters:  11/20/16 129 lb 12.8 oz (58.9 kg)  06/14/16 130 lb 12.8 oz (59.3 kg)  03/30/16 130 lb 6.4 oz (59.1 kg)   BP Readings from Last 3 Encounters:  11/20/16 (!) 196/100  06/14/16 (!) 162/82  03/30/16 (!) 160/90   Pulse Readings from Last 3 Encounters:  11/20/16 82  06/14/16 84  03/30/16 88    Renal function: CrCl cannot be calculated (Patient's most recent lab result is older than the maximum 21 days allowed.).  Past Medical History:  Diagnosis Date  . Coronary artery disease    a.12/22/15: NSTEMI s/p overlapping DES x2 and balloon angioplasty to distal AV groove Circumflex (too small for a stent).   . Depression   . Hypercholesteremia   . Hypertension   . Prostate infection   . Tobacco abuse   . Type II diabetes mellitus ([place])     Current Outpatient Medications on File Prior to Visit  Medication Sig Dispense Refill  . aspirin 81 MG chewable tablet Chew 1 tablet (81 mg total) by mouth daily.      . carvedilol (COREG) 25 MG tablet Take 1 tablet (25 mg total) by mouth 2 (two) times daily with a meal. 60 tablet 6  . clopidogrel (PLAVIX) 75 MG tablet Take 1 tablet (75 mg total) by mouth daily. 30 tablet 11  . hydrALAZINE (APRESOLINE) 50 MG tablet Take 1 tablet (50 mg total) by mouth 3 (three) times daily. 90 tablet 6  . metFORMIN (GLUCOPHAGE) 1000 MG tablet Take 1 tablet (1,000 mg total) by mouth 2 (two) times daily with a meal.    . nitroGLYCERIN (NITROSTAT) 0.4 MG SL tablet Place 1 tablet (0.4 mg total) under the tongue every 5 (five) minutes x 3 doses as needed for chest pain. 25 tablet 3  . simvastatin (ZOCOR) 20 MG tablet Take 20 mg by mouth daily.     No current facility-administered medications on file prior to visit.     No Known Allergies   Assessment/Plan:  1. Hypertension -

## 2016-12-10 ENCOUNTER — Ambulatory Visit: Payer: BC Managed Care – PPO | Admitting: Pharmacist

## 2017-04-10 ENCOUNTER — Other Ambulatory Visit: Payer: Self-pay | Admitting: Cardiovascular Disease

## 2017-05-28 ENCOUNTER — Inpatient Hospital Stay (HOSPITAL_BASED_OUTPATIENT_CLINIC_OR_DEPARTMENT_OTHER)
Admission: EM | Admit: 2017-05-28 | Discharge: 2017-06-09 | DRG: 683 | Disposition: A | Discharge status: Skilled Nursing Facility | Payer: BC Managed Care – PPO | Attending: Family Medicine | Admitting: Family Medicine

## 2017-05-28 ENCOUNTER — Inpatient Hospital Stay (HOSPITAL_COMMUNITY): Payer: BC Managed Care – PPO

## 2017-05-28 ENCOUNTER — Other Ambulatory Visit: Payer: Self-pay

## 2017-05-28 ENCOUNTER — Emergency Department (HOSPITAL_BASED_OUTPATIENT_CLINIC_OR_DEPARTMENT_OTHER): Payer: BC Managed Care – PPO

## 2017-05-28 ENCOUNTER — Encounter (HOSPITAL_BASED_OUTPATIENT_CLINIC_OR_DEPARTMENT_OTHER): Payer: Self-pay | Admitting: *Deleted

## 2017-05-28 DIAGNOSIS — N179 Acute kidney failure, unspecified: Secondary | ICD-10-CM

## 2017-05-28 DIAGNOSIS — E1122 Type 2 diabetes mellitus with diabetic chronic kidney disease: Secondary | ICD-10-CM | POA: Diagnosis present

## 2017-05-28 DIAGNOSIS — Z8673 Personal history of transient ischemic attack (TIA), and cerebral infarction without residual deficits: Secondary | ICD-10-CM

## 2017-05-28 DIAGNOSIS — D649 Anemia, unspecified: Secondary | ICD-10-CM | POA: Diagnosis present

## 2017-05-28 DIAGNOSIS — E1121 Type 2 diabetes mellitus with diabetic nephropathy: Secondary | ICD-10-CM | POA: Diagnosis present

## 2017-05-28 DIAGNOSIS — Z7902 Long term (current) use of antithrombotics/antiplatelets: Secondary | ICD-10-CM | POA: Diagnosis not present

## 2017-05-28 DIAGNOSIS — G4700 Insomnia, unspecified: Secondary | ICD-10-CM | POA: Diagnosis present

## 2017-05-28 DIAGNOSIS — N185 Chronic kidney disease, stage 5: Secondary | ICD-10-CM | POA: Diagnosis present

## 2017-05-28 DIAGNOSIS — R748 Abnormal levels of other serum enzymes: Secondary | ICD-10-CM | POA: Diagnosis not present

## 2017-05-28 DIAGNOSIS — I6381 Other cerebral infarction due to occlusion or stenosis of small artery: Secondary | ICD-10-CM | POA: Diagnosis not present

## 2017-05-28 DIAGNOSIS — I161 Hypertensive emergency: Secondary | ICD-10-CM | POA: Diagnosis present

## 2017-05-28 DIAGNOSIS — I69354 Hemiplegia and hemiparesis following cerebral infarction affecting left non-dominant side: Secondary | ICD-10-CM

## 2017-05-28 DIAGNOSIS — Z79899 Other long term (current) drug therapy: Secondary | ICD-10-CM

## 2017-05-28 DIAGNOSIS — I120 Hypertensive chronic kidney disease with stage 5 chronic kidney disease or end stage renal disease: Secondary | ICD-10-CM | POA: Diagnosis present

## 2017-05-28 DIAGNOSIS — Z7982 Long term (current) use of aspirin: Secondary | ICD-10-CM

## 2017-05-28 DIAGNOSIS — Z590 Homelessness: Secondary | ICD-10-CM

## 2017-05-28 DIAGNOSIS — E7800 Pure hypercholesterolemia, unspecified: Secondary | ICD-10-CM | POA: Diagnosis present

## 2017-05-28 DIAGNOSIS — Z9114 Patient's other noncompliance with medication regimen: Secondary | ICD-10-CM

## 2017-05-28 DIAGNOSIS — I10 Essential (primary) hypertension: Secondary | ICD-10-CM | POA: Diagnosis present

## 2017-05-28 DIAGNOSIS — Z7984 Long term (current) use of oral hypoglycemic drugs: Secondary | ICD-10-CM

## 2017-05-28 DIAGNOSIS — F32A Depression, unspecified: Secondary | ICD-10-CM | POA: Diagnosis present

## 2017-05-28 DIAGNOSIS — N189 Chronic kidney disease, unspecified: Secondary | ICD-10-CM | POA: Diagnosis present

## 2017-05-28 DIAGNOSIS — R29810 Facial weakness: Secondary | ICD-10-CM | POA: Diagnosis present

## 2017-05-28 DIAGNOSIS — F329 Major depressive disorder, single episode, unspecified: Secondary | ICD-10-CM | POA: Diagnosis not present

## 2017-05-28 DIAGNOSIS — I2583 Coronary atherosclerosis due to lipid rich plaque: Secondary | ICD-10-CM | POA: Diagnosis not present

## 2017-05-28 DIAGNOSIS — F17210 Nicotine dependence, cigarettes, uncomplicated: Secondary | ICD-10-CM | POA: Diagnosis present

## 2017-05-28 DIAGNOSIS — E872 Acidosis: Secondary | ICD-10-CM | POA: Diagnosis present

## 2017-05-28 DIAGNOSIS — Z955 Presence of coronary angioplasty implant and graft: Secondary | ICD-10-CM | POA: Diagnosis not present

## 2017-05-28 DIAGNOSIS — Z8249 Family history of ischemic heart disease and other diseases of the circulatory system: Secondary | ICD-10-CM | POA: Diagnosis not present

## 2017-05-28 DIAGNOSIS — R7989 Other specified abnormal findings of blood chemistry: Secondary | ICD-10-CM

## 2017-05-28 DIAGNOSIS — E785 Hyperlipidemia, unspecified: Secondary | ICD-10-CM | POA: Diagnosis present

## 2017-05-28 DIAGNOSIS — N2581 Secondary hyperparathyroidism of renal origin: Secondary | ICD-10-CM | POA: Diagnosis present

## 2017-05-28 DIAGNOSIS — I252 Old myocardial infarction: Secondary | ICD-10-CM | POA: Diagnosis not present

## 2017-05-28 DIAGNOSIS — T39395A Adverse effect of other nonsteroidal anti-inflammatory drugs [NSAID], initial encounter: Secondary | ICD-10-CM | POA: Diagnosis present

## 2017-05-28 DIAGNOSIS — R778 Other specified abnormalities of plasma proteins: Secondary | ICD-10-CM | POA: Diagnosis present

## 2017-05-28 DIAGNOSIS — D631 Anemia in chronic kidney disease: Secondary | ICD-10-CM | POA: Diagnosis present

## 2017-05-28 DIAGNOSIS — I2510 Atherosclerotic heart disease of native coronary artery without angina pectoris: Secondary | ICD-10-CM | POA: Diagnosis present

## 2017-05-28 DIAGNOSIS — N19 Unspecified kidney failure: Secondary | ICD-10-CM

## 2017-05-28 DIAGNOSIS — G8929 Other chronic pain: Secondary | ICD-10-CM | POA: Diagnosis present

## 2017-05-28 DIAGNOSIS — Z720 Tobacco use: Secondary | ICD-10-CM | POA: Diagnosis present

## 2017-05-28 DIAGNOSIS — N289 Disorder of kidney and ureter, unspecified: Secondary | ICD-10-CM | POA: Diagnosis not present

## 2017-05-28 DIAGNOSIS — E11311 Type 2 diabetes mellitus with unspecified diabetic retinopathy with macular edema: Secondary | ICD-10-CM | POA: Diagnosis present

## 2017-05-28 HISTORY — DX: Personal history of transient ischemic attack (TIA), and cerebral infarction without residual deficits: Z86.73

## 2017-05-28 HISTORY — DX: Anemia, unspecified: D64.9

## 2017-05-28 HISTORY — DX: Anemia in chronic kidney disease: D63.1

## 2017-05-28 HISTORY — DX: Essential (primary) hypertension: I10

## 2017-05-28 HISTORY — DX: Anemia in chronic kidney disease: N18.9

## 2017-05-28 LAB — RAPID URINE DRUG SCREEN, HOSP PERFORMED
Amphetamines: NOT DETECTED
BARBITURATES: NOT DETECTED
BENZODIAZEPINES: NOT DETECTED
COCAINE: NOT DETECTED
Opiates: NOT DETECTED
TETRAHYDROCANNABINOL: NOT DETECTED

## 2017-05-28 LAB — CBC WITH DIFFERENTIAL/PLATELET
BASOS ABS: 0 10*3/uL (ref 0.0–0.1)
BASOS PCT: 0 %
EOS ABS: 0.1 10*3/uL (ref 0.0–0.7)
Eosinophils Relative: 2 %
HEMATOCRIT: 31.8 % — AB (ref 39.0–52.0)
HEMOGLOBIN: 10.9 g/dL — AB (ref 13.0–17.0)
Lymphocytes Relative: 18 %
Lymphs Abs: 1.3 10*3/uL (ref 0.7–4.0)
MCH: 31.2 pg (ref 26.0–34.0)
MCHC: 34.3 g/dL (ref 30.0–36.0)
MCV: 91.1 fL (ref 78.0–100.0)
Monocytes Absolute: 0.5 10*3/uL (ref 0.1–1.0)
Monocytes Relative: 7 %
NEUTROS ABS: 5.4 10*3/uL (ref 1.7–7.7)
NEUTROS PCT: 73 %
Platelets: 216 10*3/uL (ref 150–400)
RBC: 3.49 MIL/uL — AB (ref 4.22–5.81)
RDW: 12.7 % (ref 11.5–15.5)
WBC: 7.4 10*3/uL (ref 4.0–10.5)

## 2017-05-28 LAB — TROPONIN I
TROPONIN I: 0.04 ng/mL — AB (ref <0.03)
Troponin I: 0.04 ng/mL (ref <0.03)

## 2017-05-28 LAB — COMPREHENSIVE METABOLIC PANEL
ALBUMIN: 3.5 g/dL (ref 3.5–5.0)
ALK PHOS: 75 U/L (ref 38–126)
ALT: 12 U/L — AB (ref 17–63)
ANION GAP: 11 (ref 5–15)
AST: 15 U/L (ref 15–41)
BILIRUBIN TOTAL: 0.3 mg/dL (ref 0.3–1.2)
BUN: 63 mg/dL — AB (ref 6–20)
CALCIUM: 8.7 mg/dL — AB (ref 8.9–10.3)
CO2: 22 mmol/L (ref 22–32)
Chloride: 103 mmol/L (ref 101–111)
Creatinine, Ser: 5.84 mg/dL — ABNORMAL HIGH (ref 0.61–1.24)
GFR calc Af Amer: 12 mL/min — ABNORMAL LOW (ref >60)
GFR calc non Af Amer: 10 mL/min — ABNORMAL LOW (ref >60)
GLUCOSE: 148 mg/dL — AB (ref 65–99)
Potassium: 4.6 mmol/L (ref 3.5–5.1)
SODIUM: 136 mmol/L (ref 135–145)
Total Protein: 6.8 g/dL (ref 6.5–8.1)

## 2017-05-28 LAB — URINALYSIS, ROUTINE W REFLEX MICROSCOPIC
BILIRUBIN URINE: NEGATIVE
GLUCOSE, UA: 250 mg/dL — AB
Ketones, ur: NEGATIVE mg/dL
Leukocytes, UA: NEGATIVE
NITRITE: NEGATIVE
PH: 6 (ref 5.0–8.0)
Protein, ur: >300 mg/dL — AB
Specific Gravity, Urine: 1.025 (ref 1.005–1.030)

## 2017-05-28 LAB — URINALYSIS, MICROSCOPIC (REFLEX)

## 2017-05-28 LAB — LIPASE, BLOOD: Lipase: 52 U/L — ABNORMAL HIGH (ref 11–51)

## 2017-05-28 LAB — ETHANOL: Alcohol, Ethyl (B): <10 mg/dL (ref <10)

## 2017-05-28 LAB — GLUCOSE, CAPILLARY: GLUCOSE-CAPILLARY: 299 mg/dL — AB (ref 65–99)

## 2017-05-28 LAB — CREATININE, URINE, RANDOM: Creatinine, Urine: 82.82 mg/dL

## 2017-05-28 LAB — SALICYLATE LEVEL

## 2017-05-28 LAB — SODIUM, URINE, RANDOM: Sodium, Ur: 47 mmol/L

## 2017-05-28 LAB — ACETAMINOPHEN LEVEL: Acetaminophen (Tylenol), Serum: <10 ug/mL — ABNORMAL LOW (ref 10–30)

## 2017-05-28 MED ORDER — INSULIN ASPART 100 UNIT/ML ~~LOC~~ SOLN
0.0000 [IU] | Freq: Three times a day (TID) | SUBCUTANEOUS | Status: DC
  Administered 2017-05-29: 1 [IU] via SUBCUTANEOUS
  Administered 2017-05-29 (×2): 2 [IU] via SUBCUTANEOUS
  Administered 2017-05-30: 1 [IU] via SUBCUTANEOUS
  Administered 2017-05-30: 2 [IU] via SUBCUTANEOUS
  Administered 2017-05-30: 3 [IU] via SUBCUTANEOUS
  Administered 2017-05-31 (×2): 2 [IU] via SUBCUTANEOUS
  Administered 2017-05-31: 1 [IU] via SUBCUTANEOUS
  Administered 2017-06-01: 2 [IU] via SUBCUTANEOUS
  Administered 2017-06-01: 3 [IU] via SUBCUTANEOUS
  Administered 2017-06-02: 1 [IU] via SUBCUTANEOUS
  Administered 2017-06-02: 2 [IU] via SUBCUTANEOUS
  Administered 2017-06-02: 3 [IU] via SUBCUTANEOUS
  Administered 2017-06-03: 2 [IU] via SUBCUTANEOUS
  Administered 2017-06-03: 1 [IU] via SUBCUTANEOUS
  Administered 2017-06-03 – 2017-06-04 (×4): 2 [IU] via SUBCUTANEOUS
  Administered 2017-06-05: 1 [IU] via SUBCUTANEOUS
  Administered 2017-06-05 – 2017-06-07 (×6): 2 [IU] via SUBCUTANEOUS
  Administered 2017-06-07 (×2): 3 [IU] via SUBCUTANEOUS
  Administered 2017-06-08 – 2017-06-09 (×4): 2 [IU] via SUBCUTANEOUS
  Administered 2017-06-09: 3 [IU] via SUBCUTANEOUS

## 2017-05-28 MED ORDER — HYDROCODONE-ACETAMINOPHEN 5-325 MG PO TABS
1.0000 | ORAL_TABLET | ORAL | Status: DC | PRN
  Administered 2017-06-02: 1 via ORAL
  Filled 2017-05-28: qty 1

## 2017-05-28 MED ORDER — LABETALOL HCL 5 MG/ML IV SOLN
10.0000 mg | INTRAVENOUS | Status: DC | PRN
  Administered 2017-05-28 – 2017-05-29 (×2): 10 mg via INTRAVENOUS
  Filled 2017-05-28: qty 4

## 2017-05-28 MED ORDER — SODIUM CHLORIDE 0.9% FLUSH
3.0000 mL | Freq: Two times a day (BID) | INTRAVENOUS | Status: DC
  Administered 2017-05-28 – 2017-06-07 (×15): 3 mL via INTRAVENOUS

## 2017-05-28 MED ORDER — ACETAMINOPHEN 650 MG RE SUPP: 650.0000 mg | Freq: Four times a day (QID) | RECTAL | Status: DC | PRN

## 2017-05-28 MED ORDER — ACETAMINOPHEN 325 MG PO TABS
650.0000 mg | ORAL_TABLET | Freq: Four times a day (QID) | ORAL | Status: DC | PRN
  Filled 2017-05-28: qty 2

## 2017-05-28 MED ORDER — LABETALOL HCL 5 MG/ML IV SOLN
10.0000 mg | Freq: Once | INTRAVENOUS | Status: AC
  Administered 2017-05-28: 10 mg via INTRAVENOUS
  Filled 2017-05-28: qty 4

## 2017-05-28 MED ORDER — CARVEDILOL 25 MG PO TABS
25.0000 mg | ORAL_TABLET | Freq: Two times a day (BID) | ORAL | Status: DC
  Administered 2017-05-29 – 2017-06-09 (×23): 25 mg via ORAL
  Filled 2017-05-28 (×24): qty 1

## 2017-05-28 MED ORDER — SIMVASTATIN 20 MG PO TABS
20.0000 mg | ORAL_TABLET | Freq: Every day | ORAL | Status: DC
  Administered 2017-05-29 – 2017-06-09 (×12): 20 mg via ORAL
  Filled 2017-05-28 (×13): qty 1

## 2017-05-28 MED ORDER — ONDANSETRON HCL 4 MG/2ML IJ SOLN
4.0000 mg | Freq: Four times a day (QID) | INTRAMUSCULAR | Status: DC | PRN
Start: 2017-05-28 — End: 2017-06-09

## 2017-05-28 MED ORDER — HEPARIN SODIUM (PORCINE) 5000 UNIT/ML IJ SOLN
5000.0000 [IU] | Freq: Three times a day (TID) | INTRAMUSCULAR | Status: DC
  Administered 2017-05-28 – 2017-06-09 (×33): 5000 [IU] via SUBCUTANEOUS
  Filled 2017-05-28 (×33): qty 1

## 2017-05-28 MED ORDER — BISACODYL 5 MG PO TBEC
5.0000 mg | DELAYED_RELEASE_TABLET | Freq: Every day | ORAL | Status: DC | PRN
  Administered 2017-06-01 – 2017-06-08 (×5): 5 mg via ORAL
  Filled 2017-05-28 (×5): qty 1

## 2017-05-28 MED ORDER — CLOPIDOGREL BISULFATE 75 MG PO TABS
75.0000 mg | ORAL_TABLET | Freq: Every day | ORAL | Status: DC
  Administered 2017-05-29 – 2017-06-09 (×12): 75 mg via ORAL
  Filled 2017-05-28 (×13): qty 1

## 2017-05-28 MED ORDER — ONDANSETRON HCL 4 MG PO TABS: 4.0000 mg | ORAL_TABLET | Freq: Four times a day (QID) | ORAL | Status: DC | PRN

## 2017-05-28 MED ORDER — SENNOSIDES-DOCUSATE SODIUM 8.6-50 MG PO TABS: 1.0000 | ORAL_TABLET | Freq: Every evening | ORAL | Status: DC | PRN

## 2017-05-28 MED ORDER — HYDRALAZINE HCL 50 MG PO TABS
50.0000 mg | ORAL_TABLET | Freq: Three times a day (TID) | ORAL | Status: DC
  Administered 2017-05-28 – 2017-05-30 (×8): 50 mg via ORAL
  Filled 2017-05-28 (×5): qty 1
  Filled 2017-05-28: qty 2
  Filled 2017-05-28 (×2): qty 1

## 2017-05-28 MED ORDER — INSULIN ASPART 100 UNIT/ML ~~LOC~~ SOLN
0.0000 [IU] | Freq: Every day | SUBCUTANEOUS | Status: DC
  Administered 2017-05-28 – 2017-05-31 (×2): 3 [IU] via SUBCUTANEOUS
  Administered 2017-06-01 – 2017-06-07 (×5): 2 [IU] via SUBCUTANEOUS

## 2017-05-28 MED ORDER — ASPIRIN 81 MG PO CHEW
81.0000 mg | CHEWABLE_TABLET | Freq: Every day | ORAL | Status: DC
  Administered 2017-05-28 – 2017-06-09 (×13): 81 mg via ORAL
  Filled 2017-05-28 (×13): qty 1

## 2017-05-28 MED ORDER — SODIUM CHLORIDE 0.9 % IV SOLN
INTRAVENOUS | Status: AC
  Administered 2017-05-28: 22:00:00 via INTRAVENOUS

## 2017-05-28 NOTE — Progress Notes (Signed)
Patient presenting with 190/110 - ?compliance.  He is also depressed, may need psychiatric evaluation.  Creatinine is currently 5.84, prior creatinine 1.91 in 2/18.  He presented for depression and for several months of neurologic symptoms.  Will admit to telemetry, give some IV pushes of medications - would not start IV drip at this time given general lack of symptoms and probability that this is long-standing uncontrolled HTN rather than HTN crisis.  Carlyon Shadow, M.D.

## 2017-05-28 NOTE — ED Notes (Signed)
Floor states unable to take report at present-states they will call back.

## 2017-05-28 NOTE — H&P (Signed)
History and Physical    Jeffery Young TOI:[id number] DOB: Jan 12, 1966 DOA: 05/28/2017  PCP: [name], MD   Patient coming from: Home, by way of [facility]  Chief Complaint: Gait difficulty, speech problems, depression   HPI: Jeffery Young is a 52 y.o. male with medical history significant for hypertension, type 2 diabetes mellitus, coronary artery disease with stents, and chronic kidney disease, now presenting to the emergency department for evaluation of multiple complaints including depression, gait abnormality, he is accompanied by family that reportand slurred speech.  Family is present and reports noting a left facial droop, dysarthria, and apparent left leg weakness for the past several months and possibly for a year.  Patient has refused to seek evaluation for this up until this point.  Family is also concerned for depression, noting that the patient has not been taking his medications and has been missing work.  Patient acknowledges some feelings of depression, but denies suicidal or homicidal ideation and denies hallucinations.  He denies any chest pain, palpitations, cough, shortness of breath, fevers, chills, abdominal pain, vomiting, or diarrhea.  [facility] ED Course: Upon arrival to the ED, patient is found to be afebrile, saturating well on room air, hypertensive to 190/111, and vitals otherwise normal.  EKG features a sinus rhythm and chest x-ray is negative for edema or consolidation.  Noncontrast head CT is negative for acute intracranial abnormality, but notable for progression of microvascular ischemic changes and several small chronic lacunar infarcts in the basal ganglia and brainstem.  Chemistry panel is concerning for creatinine 5.89, up from 1.91 in February 2018.  CBC features a stable chronic normocytic anemia with hemoglobin 10.9.  Troponin is slightly elevated to 0.04.  UDS is negative and UA is notable for proteinuria.  Patient was treated with his home  antihypertensives of Coreg and hydralazine, and also given a 10 mg IV push of labetalol in the ED.  Blood pressure improved with this, he remained hemodynamically stable, and will be admitted for ongoing evaluation and management of uncontrolled hypertension with slight troponin elevation and marked worsening in his renal function.  Review of Systems:  All other systems reviewed and apart from HPI, are negative.  Past Medical History:  Diagnosis Date  . Coronary artery disease    a.12/22/15: NSTEMI s/p overlapping DES x2 and balloon angioplasty to distal AV groove Circumflex (too small for a stent).   . Depression   . Hypercholesteremia   . Hypertension   . Prostate infection   . Tobacco abuse   . Type II diabetes mellitus ([place])     Past Surgical History:  Procedure Laterality Date  . CARDIAC CATHETERIZATION N/A 12/22/2015   Procedure: Left Heart Cath and Coronary Angiography;  Surgeon: Burnell Blanks, MD;  Location: [place] [facility];  Service: Cardiovascular;  Laterality: N/A;  . CARDIAC CATHETERIZATION N/A 12/22/2015   Procedure: Coronary Stent Intervention;  Surgeon: Burnell Blanks, MD;  Location: [place] [facility];  Service: Cardiovascular;  Laterality: N/A;  . CARDIAC CATHETERIZATION N/A 01/11/2016   Procedure: Left Heart Cath and Coronary Angiography;  Surgeon: Sherren Mocha, MD;  Location: [place] [facility];  Service: Cardiovascular;  Laterality: N/A;  . CARDIAC CATHETERIZATION N/A 01/11/2016   Procedure: Intravascular Pressure Wire/FFR Study;  Surgeon: Sherren Mocha, MD;  Location: [place] [facility];  Service: Cardiovascular;  Laterality: N/A;  . CARDIAC CATHETERIZATION N/A 01/11/2016   Procedure: Coronary Stent Intervention;  Surgeon: Sherren Mocha, MD;  Location: [place] [facility];  Service: Cardiovascular;  Laterality: N/A;  . CORONARY ANGIOPLASTY WITH STENT PLACEMENT  12/22/2015     reports that he has been smoking cigarettes.  He has a 21.75  pack-year smoking history. He has never used smokeless tobacco. He reports that he drinks about 4.2 oz of alcohol per week. He reports that he does not use drugs.  No Known Allergies  Family History  Problem Relation Age of Onset  . CAD Brother   . CVA Brother   . CAD Brother      Prior to Admission medications   Medication Sig Start Date End Date Taking? Authorizing Provider  ibuprofen (ADVIL,MOTRIN) 800 MG tablet Take 800 mg by mouth daily at 6 (six) AM. Taking wife's prescription medication   Yes [provider]  metFORMIN (GLUCOPHAGE) 1000 MG tablet Take 1 tablet (1,000 mg total) by mouth 2 (two) times daily with a meal. 01/15/16  Yes Lyda Jester M, PA-C  Multiple Vitamins-Minerals (MULTIVITAMIN ADULT PO) Take 1 tablet by mouth daily.   Yes [provider]  simvastatin (ZOCOR) 20 MG tablet Take 20 mg by mouth daily.   Yes [provider]  traMADol (ULTRAM) 50 MG tablet Take 50 mg by mouth every 6 (six) hours as needed for moderate pain or severe pain.   Yes [provider]  aspirin 81 MG chewable tablet Chew 1 tablet (81 mg total) by mouth daily. Patient not taking: Reported on 05/28/2017 01/13/16   Lyda Jester M, PA-C  carvedilol (COREG) 25 MG tablet Take 1 tablet (25 mg total) by mouth 2 (two) times daily with a meal. Patient not taking: Reported on 05/28/2017 12/26/15   Reino Bellis B, NP  clopidogrel (PLAVIX) 75 MG tablet TAKE ONE TABLET BY MOUTH DAILY Patient not taking: Reported on 05/28/2017 04/10/17   Burnell Blanks, MD  hydrALAZINE (APRESOLINE) 50 MG tablet Take 1 tablet (50 mg total) by mouth 3 (three) times daily. 06/14/16 11/20/16  Burnell Blanks, MD  nitroGLYCERIN (NITROSTAT) 0.4 MG SL tablet Place 1 tablet (0.4 mg total) under the tongue every 5 (five) minutes x 3 doses as needed for chest pain. 12/26/15   Cheryln Manly, NP    Physical Exam: Vitals:   05/28/17 1600 05/28/17 1630 05/28/17 1700  05/28/17 1730  BP: (!) 184/88 (!) 150/81 (!) 155/78 (!) 158/84  Pulse: 81 88 88 91  Resp: (!) 23 19 20 16   Temp:      TempSrc:      SpO2: 99% 96% 97% 100%  Weight:      Height:          Constitutional: NAD, calm  Eyes: PERTLA, lateral deviation of left eye  ENMT: Mucous membranes are moist. Posterior pharynx clear of any exudate or lesions.   Neck: normal, supple, no masses, no thyromegaly Respiratory: clear to auscultation bilaterally, no wheezing, no crackles. Normal respiratory effort.  Cardiovascular: S1 & S2 heard, regular rate and rhythm. No extremity edema. No significant JVD. Abdomen: No distension, no tenderness, soft. Bowel sounds normal.  Musculoskeletal: no clubbing / cyanosis. No joint deformity upper and lower extremities.   Skin: no significant rashes, lesions, ulcers. Warm, dry, well-perfused. Neurologic: Lateral deviation of left eye; mild dysarthria; mild left lower facial weakness. Sensation to light touch intact. UE strength 5/5 bilaterally. LLE strength diminished distally.  Psychiatric: Alert and oriented x 3. Pleasant and cooperative.     Labs on Admission: I have personally reviewed following labs and imaging studies  CBC: Recent Labs  Lab 05/28/17 1253  WBC 7.4  NEUTROABS 5.4  HGB 10.9*  HCT 31.8*  MCV 91.1  PLT 683   Basic Metabolic Panel: Recent Labs  Lab 05/28/17 1253  NA 136  K 4.6  CL 103  CO2 22  GLUCOSE 148*  BUN 63*  CREATININE 5.84*  CALCIUM 8.7*   GFR: Estimated Creatinine Clearance: 11.3 mL/min (A) (by C-G formula based on SCr of 5.84 mg/dL (H)). Liver Function Tests: Recent Labs  Lab 05/28/17 1253  AST 15  ALT 12*  ALKPHOS 75  BILITOT 0.3  PROT 6.8  ALBUMIN 3.5   Recent Labs  Lab 05/28/17 1253  LIPASE 52*   No results for input(s): AMMONIA in the last 168 hours. Coagulation Profile: No results for input(s): INR, PROTIME in the last 168 hours. Cardiac Enzymes: Recent Labs  Lab 05/28/17 1253 05/28/17 1737   TROPONINI 0.04* 0.04*   BNP (last 3 results) No results for input(s): PROBNP in the last 8760 hours. HbA1C: No results for input(s): HGBA1C in the last 72 hours. CBG: No results for input(s): GLUCAP in the last 168 hours. Lipid Profile: No results for input(s): CHOL, HDL, LDLCALC, TRIG, CHOLHDL, LDLDIRECT in the last 72 hours. Thyroid Function Tests: No results for input(s): TSH, T4TOTAL, FREET4, T3FREE, THYROIDAB in the last 72 hours. Anemia Panel: No results for input(s): VITAMINB12, FOLATE, FERRITIN, TIBC, IRON, RETICCTPCT in the last 72 hours. Urine analysis:    Component Value Date/Time   COLORURINE YELLOW 05/28/2017 [place] 05/28/2017 1253   LABSPEC 1.025 05/28/2017 1253   PHURINE 6.0 05/28/2017 1253   GLUCOSEU 250 (A) 05/28/2017 1253   HGBUR SMALL (A) 05/28/2017 1253   BILIRUBINUR NEGATIVE 05/28/2017 1253   KETONESUR NEGATIVE 05/28/2017 1253   PROTEINUR >300 (A) 05/28/2017 1253   UROBILINOGEN 1.0 08/21/2012 2250   NITRITE NEGATIVE 05/28/2017 1253   LEUKOCYTESUR NEGATIVE 05/28/2017 1253   Sepsis Labs: @LABRCNTIP (procalcitonin:4,lacticidven:4) )No results found for this or any previous visit (from the past 240 hour(s)).   Radiological Exams on Admission: Dg Chest 2 View  Result Date: 05/28/2017 CLINICAL DATA:  Hypertension EXAM: CHEST - 2 VIEW COMPARISON:  March 09, 2016 FINDINGS: Lungs are clear. Heart size and pulmonary vascularity are normal. No adenopathy. No bone lesions. IMPRESSION: No edema or consolidation. Electronically Signed   By: Lowella Grip III M.D.   On: 05/28/2017 14:33   Ct Head Wo Contrast  Result Date: 05/28/2017 CLINICAL DATA:  52 y/o  M; MI 1 year ago with deterioration. EXAM: CT HEAD WITHOUT CONTRAST TECHNIQUE: Contiguous axial images were obtained from the base of the skull through the vertex without intravenous contrast. COMPARISON:  03/09/2016 CT head FINDINGS: Brain: No evidence of acute infarction, hemorrhage,  hydrocephalus, extra-axial collection or mass lesion/mass effect. Small chronic lacunar infarcts are present within the bilateral lentiform nuclei, right thalamus, and right pons. A focus within the right putamen is new from the prior study. There is a background of chronic microvascular ischemic changes and parenchymal volume loss of the brain that is mildly progressed. Vascular: Calcific atherosclerosis of carotid siphons. No hyperdense vessel identified. Skull: Normal. Negative for fracture or focal lesion. Sinuses/Orbits: No acute finding. Other: Left intra-ocular lens replacement. IMPRESSION: 1. No acute intracranial abnormality identified. 2. Mild progression of chronic microvascular ischemic changes and parenchymal volume loss of the brain. Several small chronic lacunar infarcts in basal ganglia and brainstem. Electronically Signed   By: Kristine Garbe M.D.   On: 05/28/2017 13:30    EKG:  Independently reviewed. Sinus rhythm.   Assessment/Plan  1. Renal insufficiency  - SCr is 5.84 on admission, up from 1.91 in February 2019  - Potassium wnl, no acidosis, no volume issues  - He has been using a family member's ibuprofen 800 mg tablets for his chronic low back pain, possibly contributing - Check urine chemistries and renal US, provide IVF hydration, avoid nephrotoxins, renally-dose medications, and repeat chem panel in am    2. Hx of CVA with residual deficits  - Family has noted left leg weakness and mild dysarthria for several months to a yr  - CT head is negative for acute findings, but notable for old lacunar infarcts in basal ganglia and brainstem, as well as progression of microvascular ischemic disease  - Continue ASA, Plavix, and statin  - PT eval and tx requested   3. Hypertension with hypertensive urgency  - BP 190/111 in ED, asymptomatic  - He acknowledges not taking his medications for a few days  - Improved with his home medicines and one IVP labetalol  - Continue  hydralazine and Coreg   4. Type II DM  - A1c was 6.9% in 2017  - Managed with metformin at home, held on admission  - Check CBG's and use a low-intensity SSI with Novolog as needed    5. CAD; elevated troponin  - No anginal complaints on admission  - Hx of NSTEMI treated with DES to left circumflex  - Troponin slightly elevated to 0.04 on admission without angina  - Continue cardiac monitoring, trend troponin, continue ASA, Plavix, Coreg, and Zocor    6. Depression  - Denies SI, HI, or hallucinations  - Manifested in not taking his medications or going to work  - Social work consulted for available resources    DVT prophylaxis: sq heparin  Code Status: Full  Family Communication: Significant other updated at bedside Consults called: None Admission status: Inpatient    [name], MD [facility] Pager [phone]  If 7PM-7AM, please contact night-coverage [email or web address] Password [facility]  05/28/2017, 7:38 PM

## 2017-05-28 NOTE — ED Notes (Signed)
ED Provider at bedside. 

## 2017-05-28 NOTE — ED Triage Notes (Signed)
Wife states he had an MI a year ago and has been going down hill since. She thinks he has mental issues. Pt admits to feeling depressed.

## 2017-05-28 NOTE — Progress Notes (Signed)
Called back ED to get report but not ready, to call back.

## 2017-05-28 NOTE — ED Provider Notes (Signed)
[place] EMERGENCY DEPARTMENT Provider Note   CSN: [id number] Arrival date & time: 05/28/17  1150     History   Chief Complaint Chief Complaint  Patient presents with  . Depression    HPI Delrico Minehart is a 52 y.o. male.  HPI   Pt Is a 52 year old male with history of hypertension, hyperlipidemia, T2 DM, CAD s/p stents x3 (on plavix) who presents to the ED today with his ex-wife to be evaluated for multiple complaints.  His ex-wife brought him to the ED with c/o that he "has been deteriorating over the last several months ".  States he had a heart attack last year and has not seemed to be in self since then.  Has been depressed and not taking his medications, going to work, Social research officer, government. she also states that his words sound slurred and he sometimes has difficulty finding the correct words.  States he has been having some reports he drags his left leg when he walks. She is concerned that he may have had a stroke.  Patient also reports that he has paresthesias in the left arm that has been constant for several weeks.  Also reports pain and weakness to the left lower extremity which as also been ongoing for several months.  Has history of arthritis in his back and was told by PCP that her symptoms are caused because of this.  He denies any fevers, dizziness, lightheadedness, headaches, CP, SOB, abd pain, NVD, urinary sxs.  Does report chronic vision changes to the left eye due to distant trauma to the lens, and chronic vision changes in the right eye that have been present for several months due to macular edema (per pt).  He endorses that he has not been compliant with his medications and has not taken his Plavix in several weeks.  He tells me that he is not sure what all of his medicines are for and is not sure which ones he has and has not been taking.  He denies SI, at HI, AVH.  States he has not used alcohol in over a week.  Denies any illegal substance use.  Past Medical History:    Diagnosis Date  . Coronary artery disease    a.12/22/15: NSTEMI s/p overlapping DES x2 and balloon angioplasty to distal AV groove Circumflex (too small for a stent).   . Depression   . Hypercholesteremia   . Hypertension   . Prostate infection   . Tobacco abuse   . Type II diabetes mellitus [facility])     Patient Active Problem List   Diagnosis Date Noted  . Uncontrolled hypertension 05/28/2017  . Syncope 03/13/2016  . Ischemic cardiomyopathy 03/13/2016  . Unstable angina pectoris ([place]) 01/11/2016  . Hypertensive heart disease with heart failure ([place])   . Chronic combined systolic and diastolic heart failure ([place])   . Elevated troponin   . Chest pain 01/10/2016  . Coronary artery disease due to lipid rich plaque 01/10/2016  . Status post coronary artery stent placement   . CKD (chronic kidney disease), stage III ([place])   . Type 2 diabetes mellitus with diabetic nephropathy, without long-term current use of insulin ([place])   . Tobacco abuse   . Essential hypertension   . Hypercholesteremia   . NSTEMI (non-ST elevated myocardial infarction) ([place]) 12/22/2015  . Lightheadedness 08/22/2012  . Dehydration 08/22/2012    Past Surgical History:  Procedure Laterality Date  . CARDIAC CATHETERIZATION N/A 12/22/2015   Procedure: Left Heart Cath and Coronary Angiography;  Surgeon: Burnell Blanks, MD;  Location: [place] CV LAB;  Service: Cardiovascular;  Laterality: N/A;  . CARDIAC CATHETERIZATION N/A 12/22/2015   Procedure: Coronary Stent Intervention;  Surgeon: Burnell Blanks, MD;  Location: [place] CV LAB;  Service: Cardiovascular;  Laterality: N/A;  . CARDIAC CATHETERIZATION N/A 01/11/2016   Procedure: Left Heart Cath and Coronary Angiography;  Surgeon: Sherren Mocha, MD;  Location: [place] CV LAB;  Service: Cardiovascular;  Laterality: N/A;  . CARDIAC CATHETERIZATION N/A 01/11/2016   Procedure: Intravascular Pressure Wire/FFR Study;  Surgeon: Sherren Mocha, MD;   Location: [place] CV LAB;  Service: Cardiovascular;  Laterality: N/A;  . CARDIAC CATHETERIZATION N/A 01/11/2016   Procedure: Coronary Stent Intervention;  Surgeon: Sherren Mocha, MD;  Location: [place] CV LAB;  Service: Cardiovascular;  Laterality: N/A;  . CORONARY ANGIOPLASTY WITH STENT PLACEMENT  12/22/2015        Home Medications    Prior to Admission medications   Medication Sig Start Date End Date Taking? Authorizing Provider  ibuprofen (ADVIL,MOTRIN) 800 MG tablet Take 800 mg by mouth daily at 6 (six) AM. Taking wife's prescription medication   Yes [provider]  metFORMIN (GLUCOPHAGE) 1000 MG tablet Take 1 tablet (1,000 mg total) by mouth 2 (two) times daily with a meal. 01/15/16  Yes Lyda Jester M, PA-C  Multiple Vitamins-Minerals (MULTIVITAMIN ADULT PO) Take 1 tablet by mouth daily.   Yes [provider]  simvastatin (ZOCOR) 20 MG tablet Take 20 mg by mouth daily.   Yes [provider]  traMADol (ULTRAM) 50 MG tablet Take 50 mg by mouth every 6 (six) hours as needed for moderate pain or severe pain.   Yes [provider]  aspirin 81 MG chewable tablet Chew 1 tablet (81 mg total) by mouth daily. Patient not taking: Reported on 05/28/2017 01/13/16   Lyda Jester M, PA-C  carvedilol (COREG) 25 MG tablet Take 1 tablet (25 mg total) by mouth 2 (two) times daily with a meal. Patient not taking: Reported on 05/28/2017 12/26/15   Reino Bellis B, NP  clopidogrel (PLAVIX) 75 MG tablet TAKE ONE TABLET BY MOUTH DAILY Patient not taking: Reported on 05/28/2017 04/10/17   Burnell Blanks, MD  hydrALAZINE (APRESOLINE) 50 MG tablet Take 1 tablet (50 mg total) by mouth 3 (three) times daily. 06/14/16 11/20/16  Burnell Blanks, MD  nitroGLYCERIN (NITROSTAT) 0.4 MG SL tablet Place 1 tablet (0.4 mg total) under the tongue every 5 (five) minutes x 3 doses as needed for chest pain. 12/26/15   Cheryln Manly, NP    Family  History Family History  Problem Relation Age of Onset  . CAD Brother   . CVA Brother   . CAD Brother     Social History Social History   Tobacco Use  . Smoking status: Current Every Day Smoker    Packs/day: 0.75    Years: 29.00    Pack years: 21.75    Types: Cigarettes  . Smokeless tobacco: Never Used  Substance Use Topics  . Alcohol use: Yes    Alcohol/week: 4.2 oz    Types: 7 Cans of beer per week  . Drug use: No     Allergies   Patient has no known allergies.   Review of Systems Review of Systems   Physical Exam Updated Vital Signs BP (!) 158/84   Pulse 91   Temp 98.1 F (36.7 C)   Resp 16   Ht 5\' 5"  (1.651 m)  Wt 53.5 kg (118 lb)   SpO2 100%   BMI 19.64 kg/m   Physical Exam  Constitutional: He appears well-developed and well-nourished.  HENT:  Head: Normocephalic and atraumatic.  Eyes: Conjunctivae are normal.  Neck: Neck supple.  Cardiovascular: Normal rate, regular rhythm, normal heart sounds and intact distal pulses.  No murmur heard. Pulmonary/Chest: Effort normal and breath sounds normal. No respiratory distress. He has no wheezes. He has no rales.  Abdominal: Soft. Bowel sounds are normal. He exhibits no distension. There is no tenderness.  Musculoskeletal: He exhibits no edema.  Neurological: He is alert.  Mental Status:  Alert, thought content appropriate, able to give a coherent history. Speech is somewhat slow. Able to follow 2 step commands without difficulty.  Cranial Nerves:  II:  Left pupil is dilated to 73mm and fixed, right pupil reactive and round III,IV, VI: ptosis not present, extra-ocular motions intact bilaterally  V,VII: left sided facial droop, facial light touch sensation equal VIII: hearing grossly normal to voice  X: uvula elevates symmetrically  XI: bilateral shoulder shrug symmetric and strong XII: midline tongue extension without fassiculations Motor:  Normal tone.  Patient has 5/5 strength to bilateral upper  extremities asleep.  Left lower extremity has 3/5 strength diffusely in right lower extremity is 5/5 strength.   Sensory: Sensation to bilateral lower extremities intact and symmetric.  Reported abnormal sensation to her right upper extremity as compared to left. DTRs: patellar 2+ symmetric b/l Cerebellar: dysmetria with finger to nose on left, no abnormality appreciated with heel to shin bilat Gait: steady gait, walks with lump CV: 2+ radial and DP/PT pulses Pt had no pronator drift bilateral upper extremities.  Slight pronator drift with left lower cavity.  Skin: Skin is warm and dry.  Psychiatric:  Tearful on exam  Nursing note and vitals reviewed.   ED Treatments / Results  Labs (all labs ordered are listed, but only abnormal results are displayed) Labs Reviewed  CBC WITH DIFFERENTIAL/PLATELET - Abnormal; Notable for the following components:      Result Value   RBC 3.49 (*)    Hemoglobin 10.9 (*)    HCT 31.8 (*)    All other components within normal limits  COMPREHENSIVE METABOLIC PANEL - Abnormal; Notable for the following components:   Glucose, Bld 148 (*)    BUN 63 (*)    Creatinine, Ser 5.84 (*)    Calcium 8.7 (*)    ALT 12 (*)    GFR calc non Af Amer 10 (*)    GFR calc Af Amer 12 (*)    All other components within normal limits  LIPASE, BLOOD - Abnormal; Notable for the following components:   Lipase 52 (*)    All other components within normal limits  URINALYSIS, ROUTINE W REFLEX MICROSCOPIC - Abnormal; Notable for the following components:   Glucose, UA 250 (*)    Hgb urine dipstick SMALL (*)    Protein, ur >300 (*)    All other components within normal limits  TROPONIN I - Abnormal; Notable for the following components:   Troponin I 0.04 (*)    All other components within normal limits  ACETAMINOPHEN LEVEL - Abnormal; Notable for the following components:   Acetaminophen (Tylenol), Serum <10 (*)    All other components within normal limits  URINALYSIS,  MICROSCOPIC (REFLEX) - Abnormal; Notable for the following components:   Bacteria, UA FEW (*)    All other components within normal limits  RAPID URINE DRUG SCREEN, HOSP  PERFORMED  ETHANOL  SALICYLATE LEVEL  TROPONIN I    EKG EKG Interpretation  Date/Time:  Tuesday May 28 2017 13:48:29 EDT Ventricular Rate:  68 PR Interval:    QRS Duration: 87 QT Interval:  435 QTC Calculation: 463 R Axis:   18 Text Interpretation:  Sinus rhythm Anterior infarct, old Baseline wander in lead(s) II III aVR aVF Confirmed by Davonna Belling [phone]) on 05/28/2017 3:15:16 PM   Radiology Dg Chest 2 View  Result Date: 05/28/2017 CLINICAL DATA:  Hypertension EXAM: CHEST - 2 VIEW COMPARISON:  March 09, 2016 FINDINGS: Lungs are clear. Heart size and pulmonary vascularity are normal. No adenopathy. No bone lesions. IMPRESSION: No edema or consolidation. Electronically Signed   By: Lowella Grip III M.D.   On: 05/28/2017 14:33   Ct Head Wo Contrast  Result Date: 05/28/2017 CLINICAL DATA:  52 y/o  M; MI 1 year ago with deterioration. EXAM: CT HEAD WITHOUT CONTRAST TECHNIQUE: Contiguous axial images were obtained from the base of the skull through the vertex without intravenous contrast. COMPARISON:  03/09/2016 CT head FINDINGS: Brain: No evidence of acute infarction, hemorrhage, hydrocephalus, extra-axial collection or mass lesion/mass effect. Small chronic lacunar infarcts are present within the bilateral lentiform nuclei, right thalamus, and right pons. A focus within the right putamen is new from the prior study. There is a background of chronic microvascular ischemic changes and parenchymal volume loss of the brain that is mildly progressed. Vascular: Calcific atherosclerosis of carotid siphons. No hyperdense vessel identified. Skull: Normal. Negative for fracture or focal lesion. Sinuses/Orbits: No acute finding. Other: Left intra-ocular lens replacement. IMPRESSION: 1. No acute intracranial abnormality  identified. 2. Mild progression of chronic microvascular ischemic changes and parenchymal volume loss of the brain. Several small chronic lacunar infarcts in basal ganglia and brainstem. Electronically Signed   By: Kristine Garbe M.D.   On: 05/28/2017 13:30    Procedures Procedures (including critical care time)  Medications Ordered in ED Medications  aspirin chewable tablet 81 mg (81 mg Oral Given 05/28/17 1531)  carvedilol (COREG) tablet 25 mg (has no administration in time range)  clopidogrel (PLAVIX) tablet 75 mg (has no administration in time range)  hydrALAZINE (APRESOLINE) tablet 50 mg (50 mg Oral Given 05/28/17 1532)  simvastatin (ZOCOR) tablet 20 mg (has no administration in time range)  labetalol (NORMODYNE,TRANDATE) injection 10 mg (10 mg Intravenous Given 05/28/17 1438)     Initial Impression / Assessment and Plan / ED Course  I have reviewed the triage vital signs and the nursing notes.  Pertinent labs & imaging results that were available during my care of the patient were reviewed by me and considered in my medical decision making (see chart for details).   Dr Alvino Chapel consulted with Dr. Lorin Mercy who will admit the patient to the hospitalist service. Pt [facility] be [facility] to [facility].    Final Clinical Impressions(s) / ED Diagnoses   Final diagnoses:  Hypertensive emergency  Acute renal failure, unspecified acute renal failure type (HCC)  Elevated troponin  Lacunar infarction [facility]   Patient presenting with multiple complaints including depression, weakness to the left side.  Initially patient was hypertensive.  He is afebrile not tachycardic.  Normal O2 sats and respirations.  His cardiopulmonary exam is within normal limits.  His neurologic exam did show some left-sided facial droop and left-sided weakness in the lower extremities concerning for CVA.  Symptoms have been present for several months so this is not likely new.  CT head was positive for old  lacunar  infarcts to the basal ganglia and brainstem.  Given patient's elevated blood pressure, labs, troponin, EKG and chest x-ray were also ordered.  BC with no leukocytosis however did show slight anemia which appears to be baseline for patient.  CMP with elevated creatinine to 5.84 BUN to 63.  Last creatinine was completed greater than 1 year ago and was 1.91.  Unclear if this is a chronic finding or whether this is an acute renal failure given patient has been noncompliant with his blood pressure medications and his blood pressure is elevated today.  His liver function and lipase are grossly normal.  UA with glucosuria, hematuria, proteinuria.  UDS was negative.  Troponin was elevated 0.04.  It is unclear if this elevation is due to to his worsening kidney function today.  He is having no chest pain or shortness of breath.  His EKG showed normal sinus rhythm heart rate 68.  He does have old anterior infarct.  No significant significant ST elevation or depressions suggesting ischemia. Chest x-ray with no cardiomegaly, pulmonary edema or consolidation.  No widened mediastinum.  Do not suspect acute heart failure.  Patient will require admission for acute renal failure in the setting of uncontrolled hypertension.  Patient admitted to hospitalist service and will be transferred to [facility].   ED Discharge Orders    None       Bishop Dublin 05/28/17 Emelia Salisbury, MD 05/29/17 2142

## 2017-05-29 DIAGNOSIS — N179 Acute kidney failure, unspecified: Secondary | ICD-10-CM | POA: Diagnosis present

## 2017-05-29 LAB — GLUCOSE, CAPILLARY
GLUCOSE-CAPILLARY: 145 mg/dL — AB (ref 65–99)
GLUCOSE-CAPILLARY: 162 mg/dL — AB (ref 65–99)
GLUCOSE-CAPILLARY: 138 mg/dL — AB (ref 65–99)
Glucose-Capillary: 193 mg/dL — ABNORMAL HIGH (ref 65–99)

## 2017-05-29 LAB — BASIC METABOLIC PANEL
Anion gap: 13 (ref 5–15)
BUN: 61 mg/dL — ABNORMAL HIGH (ref 6–20)
CALCIUM: 8.6 mg/dL — AB (ref 8.9–10.3)
CO2: 20 mmol/L — ABNORMAL LOW (ref 22–32)
CREATININE: 5.38 mg/dL — AB (ref 0.61–1.24)
Chloride: 106 mmol/L (ref 101–111)
GFR calc non Af Amer: 11 mL/min — ABNORMAL LOW (ref >60)
GFR, EST AFRICAN AMERICAN: 13 mL/min — AB (ref >60)
Glucose, Bld: 146 mg/dL — ABNORMAL HIGH (ref 65–99)
Potassium: 4.2 mmol/L (ref 3.5–5.1)
SODIUM: 139 mmol/L (ref 135–145)

## 2017-05-29 LAB — CBC
HCT: 29.2 % — ABNORMAL LOW (ref 39.0–52.0)
Hemoglobin: 9.6 g/dL — ABNORMAL LOW (ref 13.0–17.0)
MCH: 30 pg (ref 26.0–34.0)
MCHC: 32.9 g/dL (ref 30.0–36.0)
MCV: 91.3 fL (ref 78.0–100.0)
PLATELETS: 202 10*3/uL (ref 150–400)
RBC: 3.2 MIL/uL — AB (ref 4.22–5.81)
RDW: 13.2 % (ref 11.5–15.5)
WBC: 6.7 10*3/uL (ref 4.0–10.5)

## 2017-05-29 LAB — HEMOGLOBIN A1C
HEMOGLOBIN A1C: 6.9 % — AB (ref 4.8–5.6)
MEAN PLASMA GLUCOSE: 151.33 mg/dL

## 2017-05-29 LAB — HIV ANTIBODY (ROUTINE TESTING W REFLEX): HIV SCREEN 4TH GENERATION: NONREACTIVE

## 2017-05-29 LAB — MRSA PCR SCREENING: MRSA by PCR: NEGATIVE

## 2017-05-29 LAB — TROPONIN I
TROPONIN I: 0.04 ng/mL — AB (ref <0.03)
TROPONIN I: 0.03 ng/mL — AB (ref <0.03)
Troponin I: 0.03 ng/mL (ref <0.03)

## 2017-05-29 MED ORDER — SODIUM CHLORIDE 0.9 % IV SOLN
INTRAVENOUS | Status: DC
  Administered 2017-05-29 – 2017-06-03 (×6): via INTRAVENOUS

## 2017-05-29 MED ORDER — SERTRALINE HCL 25 MG PO TABS
25.0000 mg | ORAL_TABLET | Freq: Every day | ORAL | Status: DC
  Administered 2017-05-29 – 2017-06-01 (×4): 25 mg via ORAL
  Filled 2017-05-29 (×4): qty 1

## 2017-05-29 MED ORDER — HYDRALAZINE HCL 20 MG/ML IJ SOLN: 10.0000 mg | Freq: Four times a day (QID) | INTRAMUSCULAR | Status: DC | PRN

## 2017-05-29 MED ORDER — AMLODIPINE BESYLATE 10 MG PO TABS
10.0000 mg | ORAL_TABLET | Freq: Every day | ORAL | Status: DC
  Administered 2017-05-29 – 2017-06-02 (×5): 10 mg via ORAL
  Filled 2017-05-29 (×5): qty 1

## 2017-05-29 NOTE — Evaluation (Signed)
Physical Therapy Evaluation Patient Details Name: Jeffery Young MRN: [id number] DOB: 11/10/1965 Today's Date: 05/29/2017   History of Present Illness  Pt is a 52 y.o. male admitted 05/28/17 for evaluation of multiple complaints, including depression, gait abnormality, LLE weakness, and gait abnormality over the past several months; pt has refused to seek evaluation up until this point. Head CT negative for acute abnormality; notable for progression of microvascular ischemic changes and several small chronic lacunar infarcts in the basal ganglia and brainstem. Pt also with uncontrolled HTN and worsening renal function; AKI on CKD III. PMH includes CAD, HTN, tobacco abuse, DMII, NSTEMI (12/2015).      Clinical Impression  Pt presents with an overall decrease in functional mobility secondary to above. PTA, pt indep and works as Secretary/administrator; reports multiple falls and increased difficulty with mobility secondary to fatigue and LLE weakness. Recently moved out of wife's house and currently homeless. Today, required HHA and minA to maintain balance amb with no DME; amb in hallway with RW and min guard for balance; poor safety awareness. Demonstrates L-side foot drop. Poor historian regarding timeline for onset of multiple deficits. Pt at significant risk for falls and would benefit from continued acute PT services to maximize functional mobility and independence prior to d/c with short-term SNF-level therapies.     Follow Up Recommendations SNF;Supervision for mobility/OOB    Equipment Recommendations  Rolling walker with 5" wheels    Recommendations for Other Services OT consult     Precautions / Restrictions Precautions Precautions: Fall Restrictions Weight Bearing Restrictions: No      Mobility  Bed Mobility Overal bed mobility: Needs Assistance Bed Mobility: Supine to Sit     Supine to sit: Supervision     General bed mobility comments: No physical assist  required  Transfers Overall transfer level: Needs assistance Equipment used: 1 person hand held assist;Rolling walker (2 wheeled) Transfers: Sit to/from Stand Sit to Stand: Mod assist;Min guard         General transfer comment: Stood initially with no UE support, requiring modA and HHA to correct balance. Stood 2x with RW and min guard for balance  Ambulation/Gait Ambulation/Gait assistance: Min assist;Min guard Ambulation Distance (Feet): 80 Feet Assistive device: 1 person hand held assist;Rolling walker (2 wheeled) Gait Pattern/deviations: Step-through pattern;Decreased stride length;Decreased dorsiflexion - left;Decreased weight shift to left Gait velocity: Decreased Gait velocity interpretation: <1.8 ft/sec, indicate of risk for recurrent falls General Gait Details: Initially amb in room with no DME, requiring HHA and minA to maintain balance. Amb an additional 8' with RW and close min guard for balance; pt demonstrating L foot drop, requiring increased hip flexion to compensate. Shuffling with turns and L foot getting caught on RW. Distance limited secondary to c/o fatigue  Stairs            Wheelchair Mobility    Modified Rankin (Stroke Patients Only) Modified Rankin (Stroke Patients Only) Pre-Morbid Rankin Score: No significant disability Modified Rankin: Moderately severe disability     Balance Overall balance assessment: Needs assistance   Sitting balance-Leahy Scale: Good Sitting balance - Comments: Indep to don socks EOB; increased time and effort     Standing balance-Leahy Scale: Poor Standing balance comment: Reliant on UE support                             Pertinent Vitals/Pain Pain Assessment: Faces Faces Pain Scale: Hurts little more Pain Location: Lower back Pain Descriptors /  Indicators: Constant;Sore Pain Intervention(s): Monitored during session;Repositioned    Home Living Family/patient expects to be discharged to::  Unsure Living Arrangements: Alone Available Help at Discharge: Family;Friend(s);Available PRN/intermittently             Additional Comments: Pt recently moved out of apartment where he lived with wife. Per chart, wife informed RN that pt will not be able to return home with him. Therefore, pt currently homeless; pt unsure if he has anyone to stay with at d/c    Prior Function Level of Independence: Independent         Comments: Works in housekeeping. Does not use DME to ambulate, but endorses multiple falls (mainly from tripping on L foot). Drove up until this past month     Hand Dominance        Extremity/Trunk Assessment   Upper Extremity Assessment Upper Extremity Assessment: LUE deficits/detail LUE Deficits / Details: Grossly 4/5 throughout; reports constant numbness in L hand LUE Sensation: decreased light touch LUE Coordination: decreased fine motor    Lower Extremity Assessment Lower Extremity Assessment: LLE deficits/detail LLE Deficits / Details: Hip and knee grossly 4/5 throughout, ankle DF 2/5, ankle PF 3/5; reports intermittent numbness in L foot LLE Sensation: decreased light touch       Communication   Communication: No difficulties  Cognition Arousal/Alertness: Awake/alert Behavior During Therapy: WFL for tasks assessed/performed Overall Cognitive Status: No family/caregiver present to determine baseline cognitive functioning Area of Impairment: Attention;Following commands;Safety/judgement;Awareness                   Current Attention Level: Selective   Following Commands: Follows multi-step commands inconsistently Safety/Judgement: Decreased awareness of safety Awareness: Emergent   General Comments: Pt states onset of deficits notable since heart attack this past September (per chart, NSTEMI was 12/2015).       General Comments General comments (skin integrity, edema, etc.): Resting BP 154/88, seated BP 138/92, post-amb BP 151/132.  SpO2 98% on RA    Exercises     Assessment/Plan    PT Assessment Patient needs continued PT services  PT Problem List Decreased strength;Decreased activity tolerance;Decreased balance;Decreased mobility;Decreased cognition;Decreased knowledge of use of DME;Decreased safety awareness       PT Treatment Interventions DME instruction;Gait training;Stair training;Functional mobility training;Therapeutic activities;Therapeutic exercise;Balance training;Patient/family education    PT Goals (Current goals can be found in the Care Plan section)  Acute Rehab PT Goals Patient Stated Goal: Get stronger and less fatigue PT Goal Formulation: With patient Time For Goal Achievement: 06/12/17 Potential to Achieve Goals: Fair    Frequency Min 3X/week   Barriers to discharge Decreased caregiver support;Inaccessible home environment Homeless    Co-evaluation               AM-PAC PT "6 Clicks" Daily Activity  Outcome Measure Difficulty turning over in bed (including adjusting bedclothes, sheets and blankets)?: A Little Difficulty moving from lying on back to sitting on the side of the bed? : A Little Difficulty sitting down on and standing up from a chair with arms (e.g., wheelchair, bedside commode, etc,.)?: A Little Help needed moving to and from a bed to chair (including a wheelchair)?: A Little Help needed walking in hospital room?: A Little Help needed climbing 3-5 steps with a railing? : A Lot 6 Click Score: 17    End of Session Equipment Utilized During Treatment: Gait belt Activity Tolerance: Patient limited by fatigue Patient left: in chair;with call bell/phone within reach;with family/visitor present;with nursing/sitter in room;with  chair alarm set Nurse Communication: Mobility status PT Visit Diagnosis: Other abnormalities of gait and mobility (R26.89);Other symptoms and signs involving the nervous system (R29.898)    Time: 2883-3744 PT Time Calculation (min) (ACUTE  ONLY): 30 min   Charges:   PT Evaluation $PT Eval Moderate Complexity: 1 Mod PT Treatments $Therapeutic Activity: 8-22 mins   PT G Codes:       Mabeline Caras, PT, DPT Acute Rehab Services  Pager: [place] 05/29/2017, 4:51 PM

## 2017-05-29 NOTE — Progress Notes (Addendum)
Patient Demographics:    Jeffery Young, is a 52 y.o. male, DOB - 1966/01/04, QMV:[id number]  Admit date - 05/28/2017   Admitting Physician [name], MD  Outpatient Primary MD for the patient is [name], MD  LOS - 1   Chief Complaint  Patient presents with  . Depression        Subjective:    Jeffery Young today has no fevers, no emesis,  No chest pain,     Assessment  & Plan :    Principal Problem:   AKI (acute kidney injury) on CKD III Active Problems:   Type 2 diabetes mellitus with diabetic nephropathy, without long-term current use of insulin (HCC)   Tobacco abuse   Coronary artery disease due to lipid rich plaque   Elevated troponin   Uncontrolled hypertension   Depression   Renal insufficiency   Normocytic anemia   History of completed stroke  1) AKI----acute kidney injury on CKD stage III -    suspect due to uncontrolled hypertension compounded by frequent ibuprofen use,  creatinine on admission=5.84 ,   baseline creatinine = 1.9    , creatinine is now= 5.38      ,  Avoid nephrotoxic agents/dehydration/hypotension, - Potassium wnl, no acidosis, no volume issues , renal ultrasound consistent with medical renal kidney disease without obstructive uropathy   2) Hx of CVA with residual deficits - - CT head is negative for acute findings, but notable for old lacunar infarcts in basal ganglia and brainstem, as well as progression of microvascular ischemic disease , patient with left-sided weakness and speech problems  which are not new, - Continue ASA  and statin , (on plavix for CAD), - PT eval and tx requested   3) Hypertension with hypertensive urgency - - BP 190/111 in ED, asymptomatic , BP is improving with resumption of his home medications including Coreg, amlodipine added, patient admits to noncompliance with antihypertensive medication for a few days prior to admission, may use  IV Hydralazine 10 mg  Every 4 hours Prn for systolic blood pressure over 160 mmhg  4) Type II DM - - A1c was 6.9% , continue to hold metformin due to kidney concerns, Use Novolog/Humalog Sliding scale insulin with Accu-Cheks/Fingersticks as ordered  5. CAD; elevated troponin - - No anginal complaints , suspect due to severely elevated blood pressure with mild ischemia, - Hx of NSTEMI treated with DES to left circumflex ,  Last known EF 55-60%, , continue ASA, Plavix, Coreg, and Zocor  , consider echocardiogram to rule out wall motion abnormality if troponin continues to trend up  6. Depression -  Denies SI, HI, or hallucinations ,  Manifested in not taking his medications or going to work  - Social work consulted for available resources , zoloft as ordered  7)Tobacco Abuse-  Smoking cessation counseling for 4 minutes today, consider nicotine patch   DVT prophylaxis: sq heparin  Code Status: Full    Disposition Plan  : ??? Homeless  Consults  :  SW  Lab Results  Component Value Date   PLT 202 05/29/2017    Inpatient Medications  Scheduled Meds: . amLODipine  10 mg Oral Daily  . aspirin  81 mg Oral Daily  . carvedilol  25 mg Oral BID WC  . clopidogrel  75 mg Oral Daily  . heparin  5,000 Units Subcutaneous Q8H  . hydrALAZINE  50 mg Oral TID  . insulin aspart  0-5 Units Subcutaneous QHS  . insulin aspart  0-9 Units Subcutaneous TID WC  . simvastatin  20 mg Oral Daily  . sodium chloride flush  3 mL Intravenous Q12H   Continuous Infusions: . sodium chloride 75 mL/hr at 05/29/17 1200   PRN Meds:.acetaminophen **OR** acetaminophen, bisacodyl, hydrALAZINE, HYDROcodone-acetaminophen, ondansetron **OR** ondansetron (ZOFRAN) IV, senna-docusate    Anti-infectives (From admission, onward)   None        Objective:   Vitals:   05/29/17 0200 05/29/17 0600 05/29/17 0753 05/29/17 1214  BP: (!) 155/82 (!) 187/85 (!) 179/82 126/75  Pulse: 79 74 78   Resp: 20  (!) 21 18  Temp:    97.7 F (36.5 C) 97.9 F (36.6 C)  TempSrc:   Oral Oral  SpO2: 96%  100%   Weight:      Height:        Wt Readings from Last 3 Encounters:  05/28/17 53.5 kg (118 lb)  11/20/16 58.9 kg (129 lb 12.8 oz)  06/14/16 59.3 kg (130 lb 12.8 oz)     Intake/Output Summary (Last 24 hours) at 05/29/2017 1301 Last data filed at 05/29/2017 1218 Gross per 24 hour  Intake 1565 ml  Output 1625 ml  Net -60 ml     Physical Exam  Gen:- Awake Alert,  In no apparent distress  HEENT:- [place].AT, No sclera icterus, lt eye EOM deficit is not new Neck-Supple Neck,No JVD,.  Lungs-  CTAB , good air movement CV- S1, S2 normal Abd-  +ve B.Sounds, Abd Soft, No tenderness,    Extremity/Skin:- No  edema,   good pulses Psych-affect is appropriate , oriented x3 Neuro-mostly resolved left-sided facial weakness, improving strength in left lower extremity as per patient, speech defect appears to be resolving per patient,   Data Review:   Micro Results Recent Results (from the past 240 hour(s))  MRSA PCR Screening     Status: None   Collection Time: 05/28/17  7:01 PM  Result Value Ref Range Status   MRSA by PCR NEGATIVE NEGATIVE Final    Comment:        The GeneXpert MRSA Assay (FDA approved for NASAL specimens only), is one component of a comprehensive MRSA colonization surveillance program. It is not intended to diagnose MRSA infection nor to guide or monitor treatment for MRSA infections. Performed at [facility], [place] [address], [place], [place] [address]     Radiology Reports Dg Chest 2 View  Result Date: 05/28/2017 CLINICAL DATA:  Hypertension EXAM: CHEST - 2 VIEW COMPARISON:  March 09, 2016 FINDINGS: Lungs are clear. Heart size and pulmonary vascularity are normal. No adenopathy. No bone lesions. IMPRESSION: No edema or consolidation. Electronically Signed   By: Lowella Grip III M.D.   On: 05/28/2017 14:33   Ct Head Wo Contrast  Result Date: 05/28/2017 CLINICAL DATA:  52  y/o  M; MI 1 year ago with deterioration. EXAM: CT HEAD WITHOUT CONTRAST TECHNIQUE: Contiguous axial images were obtained from the base of the skull through the vertex without intravenous contrast. COMPARISON:  03/09/2016 CT head FINDINGS: Brain: No evidence of acute infarction, hemorrhage, hydrocephalus, extra-axial collection or mass lesion/mass effect. Small chronic lacunar infarcts are present within the bilateral lentiform nuclei, right thalamus, and right pons. A focus within the right putamen is  new from the prior study. There is a background of chronic microvascular ischemic changes and parenchymal volume loss of the brain that is mildly progressed. Vascular: Calcific atherosclerosis of carotid siphons. No hyperdense vessel identified. Skull: Normal. Negative for fracture or focal lesion. Sinuses/Orbits: No acute finding. Other: Left intra-ocular lens replacement. IMPRESSION: 1. No acute intracranial abnormality identified. 2. Mild progression of chronic microvascular ischemic changes and parenchymal volume loss of the brain. Several small chronic lacunar infarcts in basal ganglia and brainstem. Electronically Signed   By: Kristine Garbe M.D.   On: 05/28/2017 13:30   US Renal  Result Date: 05/29/2017 CLINICAL DATA:  Renal failure. History of diabetes and hypertension. EXAM: RENAL / URINARY TRACT ULTRASOUND COMPLETE COMPARISON:  None. FINDINGS: Right Kidney: Length: 10.7 cm. Diffusely increased parenchymal echotexture consistent with chronic medical renal disease. No hydronephrosis. Stone in the midpole right kidney measuring 11 mm diameter. Left Kidney: Length: 11.1 cm. Diffusely increased renal parenchymal echotexture consistent with chronic medical renal disease. No hydronephrosis or solid mass identified. Bladder: No bladder wall thickening or filling defects identified. IMPRESSION: 1. Diffusely increased parenchymal echotexture to both kidneys consistent with chronic medical renal disease.  2. Nonobstructing 11 mm stone in the right kidney. 3. No hydronephrosis. Electronically Signed   By: Lucienne Capers M.D.   On: 05/29/2017 00:32     CBC Recent Labs  Lab 05/28/17 1253 05/29/17 0614  WBC 7.4 6.7  HGB 10.9* 9.6*  HCT 31.8* 29.2*  PLT 216 202  MCV 91.1 91.3  MCH 31.2 30.0  MCHC 34.3 32.9  RDW 12.7 13.2  LYMPHSABS 1.3  --   MONOABS 0.5  --   EOSABS 0.1  --   BASOSABS 0.0  --     Chemistries  Recent Labs  Lab 05/28/17 1253 05/29/17 0614  NA 136 139  K 4.6 4.2  CL 103 106  CO2 22 20*  GLUCOSE 148* 146*  BUN 63* 61*  CREATININE 5.84* 5.38*  CALCIUM 8.7* 8.6*  AST 15  --   ALT 12*  --   ALKPHOS 75  --   BILITOT 0.3  --    ------------------------------------------------------------------------------------------------------------------ No results for input(s): CHOL, HDL, LDLCALC, TRIG, CHOLHDL, LDLDIRECT in the last 72 hours.  Lab Results  Component Value Date   HGBA1C 6.9 (H) 05/29/2017   ------------------------------------------------------------------------------------------------------------------ No results for input(s): TSH, T4TOTAL, T3FREE, THYROIDAB in the last 72 hours.  Invalid input(s): FREET3 ------------------------------------------------------------------------------------------------------------------ No results for input(s): VITAMINB12, FOLATE, FERRITIN, TIBC, IRON, RETICCTPCT in the last 72 hours.  Coagulation profile No results for input(s): INR, PROTIME in the last 168 hours.  No results for input(s): DDIMER in the last 72 hours.  Cardiac Enzymes Recent Labs  Lab 05/28/17 2326 05/29/17 0614 05/29/17 1045  TROPONINI 0.04* 0.03* 0.03*   ------------------------------------------------------------------------------------------------------------------ No results found for: BNP   Roxan Hockey M.D on 05/29/2017 at 1:01 PM  Between 7am to 7pm - Pager - [phone]  After 7pm go to [email or web address] - password  [email or web address]  [facility] -  Office  [phone]   Voice Recognition Viviann Spare dictation system was used to create this note, attempts have been made to correct errors. Please contact the author with questions and/or clarifications.

## 2017-05-30 LAB — GLUCOSE, CAPILLARY
GLUCOSE-CAPILLARY: 229 mg/dL — AB (ref 65–99)
Glucose-Capillary: 126 mg/dL — ABNORMAL HIGH (ref 65–99)
Glucose-Capillary: 155 mg/dL — ABNORMAL HIGH (ref 65–99)
Glucose-Capillary: 192 mg/dL — ABNORMAL HIGH (ref 65–99)

## 2017-05-30 LAB — BASIC METABOLIC PANEL WITH GFR
Anion gap: 10 (ref 5–15)
Anion gap: 12 (ref 5–15)
BUN: 58 mg/dL — ABNORMAL HIGH (ref 6–20)
BUN: 54 mg/dL — ABNORMAL HIGH (ref 6–20)
CO2: 21 mmol/L — ABNORMAL LOW (ref 22–32)
CO2: 20 mmol/L — ABNORMAL LOW (ref 22–32)
Calcium: 8.5 mg/dL — ABNORMAL LOW (ref 8.9–10.3)
Calcium: 8.4 mg/dL — ABNORMAL LOW (ref 8.9–10.3)
Chloride: 106 mmol/L (ref 101–111)
Chloride: 104 mmol/L (ref 101–111)
Creatinine, Ser: 5.15 mg/dL — ABNORMAL HIGH (ref 0.61–1.24)
Creatinine, Ser: 5.08 mg/dL — ABNORMAL HIGH (ref 0.61–1.24)
GFR calc Af Amer: 14 mL/min — ABNORMAL LOW
GFR calc Af Amer: 14 mL/min — ABNORMAL LOW
GFR calc non Af Amer: 12 mL/min — ABNORMAL LOW
GFR calc non Af Amer: 12 mL/min — ABNORMAL LOW
Glucose, Bld: 144 mg/dL — ABNORMAL HIGH (ref 65–99)
Glucose, Bld: 161 mg/dL — ABNORMAL HIGH (ref 65–99)
Potassium: 4.5 mmol/L (ref 3.5–5.1)
Potassium: 4.5 mmol/L (ref 3.5–5.1)
Sodium: 137 mmol/L (ref 135–145)
Sodium: 136 mmol/L (ref 135–145)

## 2017-05-30 LAB — UREA NITROGEN, URINE: UREA NITROGEN UR: 404 mg/dL

## 2017-05-30 MED ORDER — ISOSORBIDE MONONITRATE ER 30 MG PO TB24
30.0000 mg | ORAL_TABLET | Freq: Every day | ORAL | Status: DC
  Administered 2017-05-30 – 2017-06-02 (×4): 30 mg via ORAL
  Filled 2017-05-30 (×4): qty 1

## 2017-05-30 MED ORDER — ALBUTEROL SULFATE (2.5 MG/3ML) 0.083% IN NEBU
2.5000 mg | INHALATION_SOLUTION | RESPIRATORY_TRACT | Status: DC | PRN
  Administered 2017-05-30: 2.5 mg via RESPIRATORY_TRACT
  Filled 2017-05-30 (×2): qty 3

## 2017-05-30 MED ORDER — GUAIFENESIN ER 600 MG PO TB12
600.0000 mg | ORAL_TABLET | Freq: Two times a day (BID) | ORAL | Status: DC
  Administered 2017-05-30 – 2017-06-09 (×21): 600 mg via ORAL
  Filled 2017-05-30 (×21): qty 1

## 2017-05-30 NOTE — Progress Notes (Signed)
Physical Therapy Treatment Patient Details Name: Jeffery Young MRN: [id number] DOB: September 13, 1965 Today's Date: 05/30/2017    History of Present Illness Pt is a 52 y.o. male admitted 05/28/17 for evaluation of multiple complaints, including depression, gait abnormality, LLE weakness, and gait abnormality over the past several months; pt has refused to seek evaluation up until this point. Head CT negative for acute abnormality; notable for progression of microvascular ischemic changes and several small chronic lacunar infarcts in the basal ganglia and brainstem. Pt also with uncontrolled HTN and worsening renal function; AKI on CKD III. PMH includes CAD, HTN, tobacco abuse, DMII, NSTEMI (12/2015).     PT Comments    Pt limited by c/o fatigue, only willing to ambulate in room secondary to this. Able to amb with RW and close min guard for balance; decreased awareness of LLE deficits with decreased ability to compensate. Pt at significant increased risk for falls secondary to this and slowed gait speed. Continue to recommend SNF-level therapies to maximize functional mobility and independence prior to return home.   Follow Up Recommendations  SNF;Supervision for mobility/OOB     Equipment Recommendations  Rolling walker with 5" wheels    Recommendations for Other Services       Precautions / Restrictions Precautions Precautions: Fall Restrictions Weight Bearing Restrictions: No    Mobility  Bed Mobility Overal bed mobility: Needs Assistance Bed Mobility: Supine to Sit     Supine to sit: Supervision     General bed mobility comments: No physical assist required  Transfers Overall transfer level: Needs assistance Equipment used: Rolling walker (2 wheeled) Transfers: Sit to/from Stand Sit to Stand: Min guard            Ambulation/Gait Ambulation/Gait assistance: Min guard Ambulation Distance (Feet): 20 Feet Assistive device: Rolling walker (2 wheeled) Gait  Pattern/deviations: Step-through pattern;Decreased stride length;Decreased dorsiflexion - left;Decreased weight shift to left Gait velocity: Decreased Gait velocity interpretation: <1.31 ft/sec, indicative of household ambulator General Gait Details: Pt only willing to amb in room; requiring RW and min guard for balance secondary to L foot drop and decreased awareness to compensate well. Easily fatigued; declining further amb distance secondary to this fatigue.   Stairs             Wheelchair Mobility    Modified Rankin (Stroke Patients Only)       Balance Overall balance assessment: Needs assistance   Sitting balance-Leahy Scale: Good Sitting balance - Comments: Indep to don socks EOB; increased time and effort     Standing balance-Leahy Scale: Poor Standing balance comment: Reliant on UE support                            Cognition Arousal/Alertness: Awake/alert Behavior During Therapy: Flat affect Overall Cognitive Status: No family/caregiver present to determine baseline cognitive functioning Area of Impairment: Attention;Following commands;Safety/judgement;Awareness;Problem solving                   Current Attention Level: Selective   Following Commands: Follows multi-step commands inconsistently Safety/Judgement: Decreased awareness of safety Awareness: Emergent Problem Solving: Requires verbal cues        Exercises      General Comments General comments (skin integrity, edema, etc.): BP 154/66; HR 80s. Declining seated therex secondary to arrival of lunch      Pertinent Vitals/Pain Pain Assessment: No/denies pain    Home Living  Prior Function            PT Goals (current goals can now be found in the care plan section) Acute Rehab PT Goals Patient Stated Goal: Get stronger and less fatigue PT Goal Formulation: With patient Time For Goal Achievement: 06/12/17 Potential to Achieve Goals:  Fair Progress towards PT goals: Progressing toward goals    Frequency    Min 3X/week      PT Plan Current plan remains appropriate    Co-evaluation              AM-PAC PT "6 Clicks" Daily Activity  Outcome Measure  Difficulty turning over in bed (including adjusting bedclothes, sheets and blankets)?: A Little Difficulty moving from lying on back to sitting on the side of the bed? : A Little Difficulty sitting down on and standing up from a chair with arms (e.g., wheelchair, bedside commode, etc,.)?: A Little Help needed moving to and from a bed to chair (including a wheelchair)?: A Little Help needed walking in hospital room?: A Little Help needed climbing 3-5 steps with a railing? : A Lot 6 Click Score: 17    End of Session   Activity Tolerance: Patient limited by fatigue Patient left: in chair;with call bell/phone within reach;with chair alarm set Nurse Communication: Mobility status PT Visit Diagnosis: Other abnormalities of gait and mobility (R26.89);Other symptoms and signs involving the nervous system (R29.898)     Time: 9324-1991 PT Time Calculation (min) (ACUTE ONLY): 13 min  Charges:  $Therapeutic Activity: 8-22 mins                    G Codes:      Mabeline Caras, PT, DPT Acute Rehab Services  Pager: [place] 05/30/2017, 2:07 PM

## 2017-05-30 NOTE — Clinical Social Work Note (Signed)
Clinical Social Work Assessment  Patient Details  Name: Jeffery Young MRN: [id number] Date of Birth: Mar 18, 1965  Date of referral:  05/30/17               Reason for consult:  Facility Placement                Permission sought to share information with:  Family Supports Permission granted to share information::  Yes, Release of Information Signed  Name::        Agency::  snf  Relationship::     Contact Information:     Housing/Transportation Living arrangements for the past 2 months:  Apartment, [place] of Information:  Patient Patient Interpreter Needed:  None Criminal Activity/Legal Involvement Pertinent to Current Situation/Hospitalization:  No - Comment as needed Significant Relationships:  Adult Children, Spouse(they are separated) Lives with:  Spouse(had to move back in with wife due to pt getting kicked out of apt) Do you feel safe going back to the place where you live?    Need for family participation in patient care:  No (Coment)  Care giving concerns: No family at bedside. Patient stated he is separated from his wife. Patient stated he had an apartment of his own but lost it and had to move back in with his wife. Patient stated he has two adult children in the area   Facilities manager / plan:  CSW met patient at bedside to discuss discharge plan. 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 Patient stated he feels like he does not have the same energy doing that job as he did before and will look into filing for disability. Patient stated he is agreeable to discharge to a rehab facility for short term rehab. CSW asked patient what his plan will be once he finish rehab, patient stated he did not have an answer. CSW to follow up with patient once bed offers are available   Employment status:  [employer] information:  Other (Comment Required)(BCBS) PT Recommendations:  [place] /  Referral to community resources:  [place]  Patient/Family's Response to care:  Patient was very sleepy during assessment but stated he appreciates CSW role in his care   Patient/Family's Understanding of and Emotional Response to Diagnosis, Current Treatment, and Prognosis:  Patient agreeable to short term rehab Emotional Assessment Appearance:  Appears stated age Attitude/Demeanor/Rapport:  Other Affect (typically observed):  Accepting Orientation:  Oriented to Self, Oriented to Situation, Oriented to Place, Oriented to  Time Alcohol / Substance use:    Psych involvement (Current and /or in the community):     Discharge Needs  Concerns to be addressed:  No discharge needs identified Readmission within the last 30 days:  No Current discharge risk:    Barriers to Discharge:  No SNF bed, Continued Medical Work up   [employer], LCSW 05/30/2017, 4:54 PM

## 2017-05-30 NOTE — Progress Notes (Signed)
Patient Demographics:    Jeffery Young, is a 52 y.o. male, DOB - 1965-12-07, PVX:[id number]  Admit date - 05/28/2017   Admitting Physician [name], MD  Outpatient Primary MD for the patient is [name], MD  LOS - 2   Chief Complaint  Patient presents with  . Depression        Subjective:    [name] today has no fevers, no emesis,  No chest pain,   Voiding well  Assessment  & Plan :    Principal Problem:   AKI (acute kidney injury) on CKD III Active Problems:   Type 2 diabetes mellitus with diabetic nephropathy, without long-term current use of insulin (HCC)   Tobacco abuse   Coronary artery disease due to lipid rich plaque   Elevated troponin   Uncontrolled hypertension   Depression   Renal insufficiency   Normocytic anemia   History of completed stroke  1) AKI----acute kidney injury on CKD stage III -    suspect due to uncontrolled hypertension compounded by frequent ibuprofen use,  creatinine on admission=5.84 ,   baseline creatinine = 1.9    , creatinine is now= 5.1      ,  Avoid nephrotoxic agents/dehydration/hypotension, - Potassium wnl, no acidosis, renal ultrasound consistent with medical renal kidney disease without obstructive uropathy, suspect underlying CKD due to hypertension and diabetic  2) Hx of CVA with residual deficits - - CT head is negative for acute findings, but notable for old lacunar infarcts in basal ganglia and brainstem, as well as progression of microvascular ischemic disease , patient with left-sided weakness and speech problems  which are not new, - Continue ASA  and statin , (on plavix for CAD),  3) Hypertension with hypertensive urgency - - BP 190/111 in ED, asymptomatic , BP is improving with resumption of his home medications including Coreg, amlodipine added, patient admits to noncompliance with antihypertensive medication for a few days prior to  admission, may use IV Hydralazine 10 mg  Every 4 hours Prn for systolic blood pressure over 160 mmhg. no evidence of acute stroke at this admission  4) Type II DM - - A1c was 6.9% , continue to hold metformin due to kidney concerns, Use Novolog/Humalog Sliding scale insulin with Accu-Cheks/Fingersticks as ordered  5. CAD; elevated troponin - - No anginal complaints , suspect due to severely elevated blood pressure with mild ischemia, - Hx of NSTEMI treated with DES to left circumflex ,  Last known EF 55-60%, , continue ASA, Plavix, Coreg, and Zocor  , troponin peaked at 0.04,.    6. Depression -  Denies SI, HI, or hallucinations , depressive symptoms Manifested in not taking his medications or going to work  - Social work consulted for available resources , zoloft as ordered  7)Tobacco Abuse-  Smoking cessation advised  DVT prophylaxis: sq heparin  Code Status: Full    Disposition Plan  : ??? Homeless, prior to admission lives with his ex-wife  Consults  :  SW/nephrology  Lab Results  Component Value Date   PLT 202 05/29/2017    Inpatient Medications  Scheduled Meds: . amLODipine  10 mg Oral Daily  . aspirin  81 mg Oral Daily  . carvedilol  25 mg  Oral BID WC  . clopidogrel  75 mg Oral Daily  . guaiFENesin  600 mg Oral BID  . heparin  5,000 Units Subcutaneous Q8H  . hydrALAZINE  50 mg Oral TID  . insulin aspart  0-5 Units Subcutaneous QHS  . insulin aspart  0-9 Units Subcutaneous TID WC  . isosorbide mononitrate  30 mg Oral Daily  . sertraline  25 mg Oral Daily  . simvastatin  20 mg Oral Daily  . sodium chloride flush  3 mL Intravenous Q12H   Continuous Infusions: . sodium chloride 75 mL/hr at 05/30/17 0701   PRN Meds:.acetaminophen **OR** acetaminophen, albuterol, bisacodyl, hydrALAZINE, HYDROcodone-acetaminophen, ondansetron **OR** ondansetron (ZOFRAN) IV, senna-docusate    Anti-infectives (From admission, onward)   None        Objective:   Vitals:    05/30/17 1151 05/30/17 1200 05/30/17 1614 05/30/17 1937  BP: (!) 144/78 (!) 157/55 (!) 157/79 130/76  Pulse: 67 69 67 74  Resp: 16 15 (!) 21 (!) 21  Temp: 97.7 F (36.5 C)  98.2 F (36.8 C) 98.5 F (36.9 C)  TempSrc: Oral  Oral Oral  SpO2: 97% 96% 99% 99%  Weight:      Height:        Wt Readings from Last 3 Encounters:  05/28/17 53.5 kg (118 lb)  11/20/16 58.9 kg (129 lb 12.8 oz)  06/14/16 59.3 kg (130 lb 12.8 oz)     Intake/Output Summary (Last 24 hours) at 05/30/2017 2012 Last data filed at 05/30/2017 1302 Gross per 24 hour  Intake 1565 ml  Output 1650 ml  Net -85 ml     Physical Exam  Gen:- Awake Alert,  In no apparent distress  HEENT:- [place].AT, No sclera icterus, lt eye EOM deficit is not new Neck-Supple Neck,No JVD,.  Lungs-  CTAB , good air movement CV- S1, S2 normal Abd-  +ve B.Sounds, Abd Soft, No tenderness,    Extremity/Skin:- No  edema,   good pulses Psych-affect is appropriate , oriented x3 Neuro-mostly resolved left-sided facial weakness, improving strength in left lower extremity as per patient, speech defect appears to be resolving per patient,   Data Review:   Micro Results Recent Results (from the past 240 hour(s))  MRSA PCR Screening     Status: None   Collection Time: 05/28/17  7:01 PM  Result Value Ref Range Status   MRSA by PCR NEGATIVE NEGATIVE Final    Comment:        The GeneXpert MRSA Assay (FDA approved for NASAL specimens only), is one component of a comprehensive MRSA colonization surveillance program. It is not intended to diagnose MRSA infection nor to guide or monitor treatment for MRSA infections. Performed at [facility], [place] [address], [place], [place] [address]     Radiology Reports Dg Chest 2 View  Result Date: 05/28/2017 CLINICAL DATA:  Hypertension EXAM: CHEST - 2 VIEW COMPARISON:  March 09, 2016 FINDINGS: Lungs are clear. Heart size and pulmonary vascularity are normal. No adenopathy. No bone lesions.  IMPRESSION: No edema or consolidation. Electronically Signed   By: Lowella Grip III M.D.   On: 05/28/2017 14:33   Ct Head Wo Contrast  Result Date: 05/28/2017 CLINICAL DATA:  52 y/o  M; MI 1 year ago with deterioration. EXAM: CT HEAD WITHOUT CONTRAST TECHNIQUE: Contiguous axial images were obtained from the base of the skull through the vertex without intravenous contrast. COMPARISON:  03/09/2016 CT head FINDINGS: Brain: No evidence of acute infarction, hemorrhage, hydrocephalus, extra-axial  collection or mass lesion/mass effect. Small chronic lacunar infarcts are present within the bilateral lentiform nuclei, right thalamus, and right pons. A focus within the right putamen is new from the prior study. There is a background of chronic microvascular ischemic changes and parenchymal volume loss of the brain that is mildly progressed. Vascular: Calcific atherosclerosis of carotid siphons. No hyperdense vessel identified. Skull: Normal. Negative for fracture or focal lesion. Sinuses/Orbits: No acute finding. Other: Left intra-ocular lens replacement. IMPRESSION: 1. No acute intracranial abnormality identified. 2. Mild progression of chronic microvascular ischemic changes and parenchymal volume loss of the brain. Several small chronic lacunar infarcts in basal ganglia and brainstem. Electronically Signed   By: Kristine Garbe M.D.   On: 05/28/2017 13:30   US Renal  Result Date: 05/29/2017 CLINICAL DATA:  Renal failure. History of diabetes and hypertension. EXAM: RENAL / URINARY TRACT ULTRASOUND COMPLETE COMPARISON:  None. FINDINGS: Right Kidney: Length: 10.7 cm. Diffusely increased parenchymal echotexture consistent with chronic medical renal disease. No hydronephrosis. Stone in the midpole right kidney measuring 11 mm diameter. Left Kidney: Length: 11.1 cm. Diffusely increased renal parenchymal echotexture consistent with chronic medical renal disease. No hydronephrosis or solid mass identified.  Bladder: No bladder wall thickening or filling defects identified. IMPRESSION: 1. Diffusely increased parenchymal echotexture to both kidneys consistent with chronic medical renal disease. 2. Nonobstructing 11 mm stone in the right kidney. 3. No hydronephrosis. Electronically Signed   By: Lucienne Capers M.D.   On: 05/29/2017 00:32     CBC Recent Labs  Lab 05/28/17 1253 05/29/17 0614  WBC 7.4 6.7  HGB 10.9* 9.6*  HCT 31.8* 29.2*  PLT 216 202  MCV 91.1 91.3  MCH 31.2 30.0  MCHC 34.3 32.9  RDW 12.7 13.2  LYMPHSABS 1.3  --   MONOABS 0.5  --   EOSABS 0.1  --   BASOSABS 0.0  --     Chemistries  Recent Labs  Lab 05/28/17 1253 05/29/17 0614 05/30/17 0222 05/30/17 1049  NA 136 139 137 136  K 4.6 4.2 4.5 4.5  CL 103 106 106 104  CO2 22 20* 21* 20*  GLUCOSE 148* 146* 144* 161*  BUN 63* 61* 58* 54*  CREATININE 5.84* 5.38* 5.15* 5.08*  CALCIUM 8.7* 8.6* 8.5* 8.4*  AST 15  --   --   --   ALT 12*  --   --   --   ALKPHOS 75  --   --   --   BILITOT 0.3  --   --   --    ------------------------------------------------------------------------------------------------------------------ No results for input(s): CHOL, HDL, LDLCALC, TRIG, CHOLHDL, LDLDIRECT in the last 72 hours.  Lab Results  Component Value Date   HGBA1C 6.9 (H) 05/29/2017   ------------------------------------------------------------------------------------------------------------------ No results for input(s): TSH, T4TOTAL, T3FREE, THYROIDAB in the last 72 hours.  Invalid input(s): FREET3 ------------------------------------------------------------------------------------------------------------------ No results for input(s): VITAMINB12, FOLATE, FERRITIN, TIBC, IRON, RETICCTPCT in the last 72 hours.  Coagulation profile No results for input(s): INR, PROTIME in the last 168 hours.  No results for input(s): DDIMER in the last 72 hours.  Cardiac Enzymes Recent Labs  Lab 05/28/17 2326 05/29/17 0614  05/29/17 1045  TROPONINI 0.04* 0.03* 0.03*   ------------------------------------------------------------------------------------------------------------------ No results found for: BNP   Roxan Hockey M.D on 05/30/2017 at 8:12 PM  Between 7am to 7pm - Pager - [phone]  After 7pm go to [email or web address] - password [email or web address]  [facility] -  Office  [phone]   Voice Recognition Viviann Spare dictation system was  used to create this note, attempts have been made to correct errors. Please contact the author with questions and/or clarifications.

## 2017-05-31 LAB — BASIC METABOLIC PANEL
ANION GAP: 11 (ref 5–15)
BUN: 55 mg/dL — ABNORMAL HIGH (ref 6–20)
CHLORIDE: 105 mmol/L (ref 101–111)
CO2: 20 mmol/L — ABNORMAL LOW (ref 22–32)
Calcium: 8.3 mg/dL — ABNORMAL LOW (ref 8.9–10.3)
Creatinine, Ser: 5.11 mg/dL — ABNORMAL HIGH (ref 0.61–1.24)
GFR calc Af Amer: 14 mL/min — ABNORMAL LOW (ref >60)
GFR calc non Af Amer: 12 mL/min — ABNORMAL LOW (ref >60)
GLUCOSE: 168 mg/dL — AB (ref 65–99)
POTASSIUM: 4.1 mmol/L (ref 3.5–5.1)
Sodium: 136 mmol/L (ref 135–145)

## 2017-05-31 LAB — GLUCOSE, CAPILLARY
GLUCOSE-CAPILLARY: 143 mg/dL — AB (ref 65–99)
GLUCOSE-CAPILLARY: 161 mg/dL — AB (ref 65–99)
Glucose-Capillary: 179 mg/dL — ABNORMAL HIGH (ref 65–99)
Glucose-Capillary: 182 mg/dL — ABNORMAL HIGH (ref 65–99)
Glucose-Capillary: 261 mg/dL — ABNORMAL HIGH (ref 65–99)

## 2017-05-31 LAB — IRON AND TIBC
Iron: 67 ug/dL (ref 45–182)
Saturation Ratios: 29 % (ref 17.9–39.5)
TIBC: 234 ug/dL — ABNORMAL LOW (ref 250–450)
UIBC: 167 ug/dL

## 2017-05-31 LAB — FERRITIN: FERRITIN: 48 ng/mL (ref 24–336)

## 2017-05-31 MED ORDER — SODIUM BICARBONATE 650 MG PO TABS
1300.0000 mg | ORAL_TABLET | Freq: Two times a day (BID) | ORAL | Status: DC
  Administered 2017-05-31 – 2017-06-09 (×19): 1300 mg via ORAL
  Filled 2017-05-31 (×20): qty 2

## 2017-05-31 MED ORDER — HYDRALAZINE HCL 50 MG PO TABS
100.0000 mg | ORAL_TABLET | Freq: Three times a day (TID) | ORAL | Status: DC
  Administered 2017-05-31 – 2017-06-09 (×28): 100 mg via ORAL
  Filled 2017-05-31 (×28): qty 2

## 2017-05-31 NOTE — Progress Notes (Signed)
Patient Demographics:    Jeffery Young, is a 52 y.o. male, DOB - [birth year], UYQ:[id number]  Admit date - 05/28/2017   Admitting Physician [name], MD  Outpatient Primary MD for the patient is [name], MD  LOS - 3   Chief Complaint  Patient presents with  . Depression        Subjective:    [name] today has no fevers, no emesis,  No chest pain,   complains of generalized weakness, appetite remains poor  Assessment  & Plan :    Principal Problem:   AKI (acute kidney injury) on CKD III Active Problems:   Type 2 diabetes mellitus with diabetic nephropathy, without long-term current use of insulin (HCC)   Tobacco abuse   Coronary artery disease due to lipid rich plaque   Elevated troponin   Uncontrolled hypertension   Depression   Renal insufficiency   Normocytic anemia   History of completed stroke  1) AKI----acute kidney injury on CKD stage III -    creatinine still above 5, nephrology consult/input appreciated, bicarb added , suspect due to uncontrolled hypertension compounded by frequent ibuprofen use,  creatinine on admission=5.84 ,   baseline creatinine = 1.9    , creatinine is now= 5.11      ,  Avoid nephrotoxic agents/dehydration/hypotension, - Potassium wnl, no acidosis, renal ultrasound consistent with medical renal kidney disease without obstructive uropathy, suspect underlying CKD due to hypertension and diabetic,   2) Hx of CVA with residual deficits - - CT head is negative for acute findings, but notable for old lacunar infarcts in basal ganglia and brainstem, as well as progression of microvascular ischemic disease , patient with left-sided weakness and speech problems  which are not new, - Continue ASA  and statin , (on plavix for CAD), no new/acute stroke this admission  3) Hypertension with hypertensive urgency - - much improved BP control overall, on admission BP  190/111 in ED, asymptomatic , continue Coreg, c/n amlodipine, give hydralazine 100 mg 3 times daily and isosorbide 30 mg daily, patient admits to noncompliance with antihypertensive medication for a few days prior to admission, may use IV Hydralazine 10 mg  Every 4 hours Prn for systolic blood pressure over 160 mmhg. no evidence of acute/new stroke at this admission  4) Type II DM - - A1c was 6.9% , continue to hold metformin due to kidney concerns, Use Novolog/Humalog Sliding scale insulin with Accu-Cheks/Fingersticks as ordered  5. CAD; elevated troponin - - No anginal complaints , suspect due to severely elevated blood pressure with mild ischemia, - Hx of NSTEMI treated with DES to left circumflex ,  Last known EF 55-60%, , continue ASA, Plavix, Coreg, and Zocor  , troponin peaked at 0.04,.    6. Depression -  Denies SI, HI, or hallucinations , depressive symptoms Manifested in not taking his medications or going to work  - Social work consulted for available resources , c/n zoloft as ordered  7)Tobacco Abuse-  Smoking cessation advised  DVT prophylaxis: sq heparin  Code Status: Full   Disposition Plan  : ??? Homeless, prior to admission lives with his ex-wife  Consults  :  SW/nephrology  Lab Results  Component Value Date   PLT  202 05/29/2017   Inpatient Medications  Scheduled Meds: . amLODipine  10 mg Oral Daily  . aspirin  81 mg Oral Daily  . carvedilol  25 mg Oral BID WC  . clopidogrel  75 mg Oral Daily  . guaiFENesin  600 mg Oral BID  . heparin  5,000 Units Subcutaneous Q8H  . hydrALAZINE  100 mg Oral Q8H  . insulin aspart  0-5 Units Subcutaneous QHS  . insulin aspart  0-9 Units Subcutaneous TID WC  . isosorbide mononitrate  30 mg Oral Daily  . sertraline  25 mg Oral Daily  . simvastatin  20 mg Oral Daily  . sodium bicarbonate  1,300 mg Oral BID  . sodium chloride flush  3 mL Intravenous Q12H   Continuous Infusions: . sodium chloride 75 mL/hr at 05/31/17 1019    PRN Meds:.acetaminophen **OR** acetaminophen, albuterol, bisacodyl, hydrALAZINE, HYDROcodone-acetaminophen, ondansetron **OR** ondansetron (ZOFRAN) IV, senna-docusate    Anti-infectives (From admission, onward)   None        Objective:   Vitals:   05/31/17 0513 05/31/17 0749 05/31/17 1150 05/31/17 1532  BP:  (!) 171/88 (!) 143/80 131/68  Pulse:  72 64 70  Resp:  (!) 21 20 (!) 25  Temp:  98.7 F (37.1 C) 98.7 F (37.1 C) 98.8 F (37.1 C)  TempSrc:  Oral Oral Oral  SpO2:  96% 99% 100%  Weight: 57.6 kg (127 lb)     Height:        Wt Readings from Last 3 Encounters:  05/31/17 57.6 kg (127 lb)  11/20/16 58.9 kg (129 lb 12.8 oz)  06/14/16 59.3 kg (130 lb 12.8 oz)     Intake/Output Summary (Last 24 hours) at 05/31/2017 1810 Last data filed at 05/31/2017 1500 Gross per 24 hour  Intake 1215 ml  Output 1725 ml  Net -510 ml     Physical Exam  Gen:- Awake Alert,  In no apparent distress  HEENT:- [place].AT, No sclera icterus, lt eye EOM deficit is not new Neck-Supple Neck,No JVD,.  Lungs-  CTAB , good air movement CV- S1, S2 normal Abd-  +ve B.Sounds, Abd Soft, No tenderness,    Extremity/Skin:- No  edema,   good pulses Psych-affect is appropriate , oriented x3 Neuro-patient has some left-sided deficits from previous stroke, no new acute deficits this time around, but he does have mostly generalized weakness, speech defect appears to have resolved.   Data Review:   Micro Results Recent Results (from the past 240 hour(s))  MRSA PCR Screening     Status: None   Collection Time: 05/28/17  7:01 PM  Result Value Ref Range Status   MRSA by PCR NEGATIVE NEGATIVE Final    Comment:        The GeneXpert MRSA Assay (FDA approved for NASAL specimens only), is one component of a comprehensive MRSA colonization surveillance program. It is not intended to diagnose MRSA infection nor to guide or monitor treatment for MRSA infections. Performed at [facility], [place] [address], [place], [place] [address]     Radiology Reports Dg Chest 2 View  Result Date: 05/28/2017 CLINICAL DATA:  Hypertension EXAM: CHEST - 2 VIEW COMPARISON:  March 09, 2016 FINDINGS: Lungs are clear. Heart size and pulmonary vascularity are normal. No adenopathy. No bone lesions. IMPRESSION: No edema or consolidation. Electronically Signed   By: Lowella Grip III M.D.   On: 05/28/2017 14:33   Ct Head Wo Contrast  Result Date: 05/28/2017 CLINICAL DATA:  52  y/o  M; MI 1 year ago with deterioration. EXAM: CT HEAD WITHOUT CONTRAST TECHNIQUE: Contiguous axial images were obtained from the base of the skull through the vertex without intravenous contrast. COMPARISON:  03/09/2016 CT head FINDINGS: Brain: No evidence of acute infarction, hemorrhage, hydrocephalus, extra-axial collection or mass lesion/mass effect. Small chronic lacunar infarcts are present within the bilateral lentiform nuclei, right thalamus, and right pons. A focus within the right putamen is new from the prior study. There is a background of chronic microvascular ischemic changes and parenchymal volume loss of the brain that is mildly progressed. Vascular: Calcific atherosclerosis of carotid siphons. No hyperdense vessel identified. Skull: Normal. Negative for fracture or focal lesion. Sinuses/Orbits: No acute finding. Other: Left intra-ocular lens replacement. IMPRESSION: 1. No acute intracranial abnormality identified. 2. Mild progression of chronic microvascular ischemic changes and parenchymal volume loss of the brain. Several small chronic lacunar infarcts in basal ganglia and brainstem. Electronically Signed   By: Kristine Garbe M.D.   On: 05/28/2017 13:30   US Renal  Result Date: 05/29/2017 CLINICAL DATA:  Renal failure. History of diabetes and hypertension. EXAM: RENAL / URINARY TRACT ULTRASOUND COMPLETE COMPARISON:  None. FINDINGS: Right Kidney: Length: 10.7 cm. Diffusely increased parenchymal echotexture  consistent with chronic medical renal disease. No hydronephrosis. Stone in the midpole right kidney measuring 11 mm diameter. Left Kidney: Length: 11.1 cm. Diffusely increased renal parenchymal echotexture consistent with chronic medical renal disease. No hydronephrosis or solid mass identified. Bladder: No bladder wall thickening or filling defects identified. IMPRESSION: 1. Diffusely increased parenchymal echotexture to both kidneys consistent with chronic medical renal disease. 2. Nonobstructing 11 mm stone in the right kidney. 3. No hydronephrosis. Electronically Signed   By: Lucienne Capers M.D.   On: 05/29/2017 00:32     CBC Recent Labs  Lab 05/28/17 1253 05/29/17 0614  WBC 7.4 6.7  HGB 10.9* 9.6*  HCT 31.8* 29.2*  PLT 216 202  MCV 91.1 91.3  MCH 31.2 30.0  MCHC 34.3 32.9  RDW 12.7 13.2  LYMPHSABS 1.3  --   MONOABS 0.5  --   EOSABS 0.1  --   BASOSABS 0.0  --     Chemistries  Recent Labs  Lab 05/28/17 1253 05/29/17 0614 05/30/17 0222 05/30/17 1049 05/31/17 0241  NA 136 139 137 136 136  K 4.6 4.2 4.5 4.5 4.1  CL 103 106 106 104 105  CO2 22 20* 21* 20* 20*  GLUCOSE 148* 146* 144* 161* 168*  BUN 63* 61* 58* 54* 55*  CREATININE 5.84* 5.38* 5.15* 5.08* 5.11*  CALCIUM 8.7* 8.6* 8.5* 8.4* 8.3*  AST 15  --   --   --   --   ALT 12*  --   --   --   --   ALKPHOS 75  --   --   --   --   BILITOT 0.3  --   --   --   --    ------------------------------------------------------------------------------------------------------------------ No results for input(s): CHOL, HDL, LDLCALC, TRIG, CHOLHDL, LDLDIRECT in the last 72 hours.  Lab Results  Component Value Date   HGBA1C 6.9 (H) 05/29/2017   ------------------------------------------------------------------------------------------------------------------ No results for input(s): TSH, T4TOTAL, T3FREE, THYROIDAB in the last 72 hours.  Invalid input(s):  FREET3 ------------------------------------------------------------------------------------------------------------------ Recent Labs    05/31/17 0954  FERRITIN 48  TIBC 234*  IRON 67    Coagulation profile No results for input(s): INR, PROTIME in the last 168 hours.  No results for input(s): DDIMER in the last  72 hours.  Cardiac Enzymes Recent Labs  Lab 05/28/17 2326 05/29/17 0614 05/29/17 1045  TROPONINI 0.04* 0.03* 0.03*   ------------------------------------------------------------------------------------------------------------------ No results found for: BNP   Roxan Hockey M.D on 05/31/2017 at 6:10 PM  Between 7am to 7pm - Pager - [phone]  After 7pm go to [email or web address] - password [email or web address]  [facility] -  Office  [phone]   Voice Recognition Viviann Spare dictation system was used to create this note, attempts have been made to correct errors. Please contact the author with questions and/or clarifications.

## 2017-05-31 NOTE — Plan of Care (Signed)
  Problem: Health Behavior/Discharge Planning: Goal: Ability to manage health-related needs will improve Outcome: Progressing   Problem: Clinical Measurements: Goal: Ability to maintain clinical measurements within normal limits will improve Outcome: Progressing Goal: Will remain free from infection Outcome: Progressing Goal: Diagnostic test results will improve Outcome: Progressing Goal: Respiratory complications will improve Outcome: Progressing Goal: Cardiovascular complication will be avoided Outcome: Progressing   Problem: Activity: Goal: Risk for activity intolerance will decrease Outcome: Progressing   Problem: Nutrition: Goal: Adequate nutrition will be maintained Outcome: Progressing   Problem: Pain Managment: Goal: General experience of comfort will improve Outcome: Progressing   Problem: Safety: Goal: Ability to remain free from injury will improve Outcome: Progressing   Problem: Skin Integrity: Goal: Risk for impaired skin integrity will decrease Outcome: Progressing   Problem: Spiritual Needs Goal: Ability to function at adequate level Outcome: Progressing

## 2017-05-31 NOTE — Consult Note (Signed)
[facility] Consult Note     Date: 05/31/2017                  Patient Name:  Jeffery Young  MRN: [id number]  DOB: 1965/10/24  Age / Sex: 52 y.o., male         PCP: [name], MD                 Service Requesting Consult:  Internal medicine                 Reason for Consult:  Acute kidney injury            Chief Complaint: Gait difficulty and change in speech HPI: Patient is a 52 year old male presenting with dysarthria and abnormal gait.  PMH significant for CAD and H/O CVA with residual left-sided weakness, CKD stage III, HTN, type 2 diabetes mellitus, tobacco use disorder, depression.  Family was concerned patient was not taking his medications and that he may be suffering from depression.  Patient is without SI or HI.  Upon arrival, patient was in hypertensive urgency.  Noncontrasted CT negative for acute intracranial abnormalities with residual chronic lacunar infarct of the basal ganglia and brainstem.  Patient also found to have AKI based on creatinine 5.89 up from his baseline 1.91 as of February 2018.  Nephrology was consulted.  Past Medical History:  Diagnosis Date  . Coronary artery disease    a.12/22/15: NSTEMI s/p overlapping DES x2 and balloon angioplasty to distal AV groove Circumflex (too small for a stent).   . Depression   . Hypercholesteremia   . Hypertension   . Prostate infection   . Tobacco abuse   . Type II diabetes mellitus ([place])     Past Surgical History:  Procedure Laterality Date  . CARDIAC CATHETERIZATION N/A 12/22/2015   Procedure: Left Heart Cath and Coronary Angiography;  Surgeon: Burnell Blanks, MD;  Location: [facility];  Service: Cardiovascular;  Laterality: N/A;  . CARDIAC CATHETERIZATION N/A 12/22/2015   Procedure: Coronary Stent Intervention;  Surgeon: Burnell Blanks, MD;  Location: [facility];  Service: Cardiovascular;  Laterality: N/A;  . CARDIAC CATHETERIZATION N/A 01/11/2016   Procedure: Left Heart Cath and Coronary Angiography;  Surgeon: Sherren Mocha, MD;  Location: [facility];  Service: Cardiovascular;  Laterality: N/A;  . CARDIAC CATHETERIZATION N/A 01/11/2016   Procedure: Intravascular Pressure Wire/FFR Study;  Surgeon: Sherren Mocha, MD;  Location: [facility];  Service: Cardiovascular;  Laterality: N/A;  . CARDIAC CATHETERIZATION N/A 01/11/2016   Procedure: Coronary Stent Intervention;  Surgeon: Sherren Mocha, MD;  Location: [facility];  Service: Cardiovascular;  Laterality: N/A;  . CORONARY ANGIOPLASTY WITH STENT PLACEMENT  12/22/2015    Family History  Problem Relation Age of Onset  . CAD Brother   . CVA Brother   . CAD Brother    Social History:  reports that he has been smoking cigarettes.  He has a 21.75 pack-year smoking history. He has never used smokeless tobacco. He reports that he drinks about 4.2 oz of alcohol per week. He reports that he does not use drugs.  Allergies: No Known Allergies  Medications Prior to Admission  Medication Sig Dispense Refill  . ibuprofen (ADVIL,MOTRIN) 800 MG tablet Take 800 mg by mouth daily at 6 (six) AM. Taking wife's prescription medication    . metFORMIN (GLUCOPHAGE) 1000 MG tablet Take 1 tablet (1,000 mg total) by mouth 2 (two) times  daily with a meal.    . Multiple Vitamins-Minerals (MULTIVITAMIN ADULT PO) Take 1 tablet by mouth daily.    . simvastatin (ZOCOR) 20 MG tablet Take 20 mg by mouth daily.    . traMADol (ULTRAM) 50 MG tablet Take 50 mg by mouth every 6 (six) hours as needed for moderate pain or severe pain.    Marland Kitchen aspirin 81 MG chewable tablet Chew 1 tablet (81 mg total) by mouth daily. (Patient not taking: Reported on 05/28/2017)    . carvedilol (COREG) 25 MG tablet Take 1 tablet (25 mg total) by mouth 2 (two) times daily with a meal. (Patient not taking: Reported on 05/28/2017) 60 tablet 6  . clopidogrel (PLAVIX) 75 MG tablet TAKE ONE TABLET BY MOUTH DAILY (Patient not taking:  Reported on 05/28/2017) 90 tablet 2  . hydrALAZINE (APRESOLINE) 50 MG tablet Take 1 tablet (50 mg total) by mouth 3 (three) times daily. 90 tablet 6  . nitroGLYCERIN (NITROSTAT) 0.4 MG SL tablet Place 1 tablet (0.4 mg total) under the tongue every 5 (five) minutes x 3 doses as needed for chest pain. 25 tablet 3    Results for orders placed or performed during the hospital encounter of 05/28/17 (from the past 48 hour(s))  Troponin I     Status: Abnormal   Collection Time: 05/29/17 10:45 AM  Result Value Ref Range   Troponin I 0.03 (HH) <0.03 ng/mL    Comment: CRITICAL VALUE NOTED.  VALUE IS CONSISTENT WITH PREVIOUSLY REPORTED AND CALLED VALUE. Performed at [facility], [place] [address], [place], [place] [address]   Glucose, capillary     Status: Abnormal   Collection Time: 05/29/17  1:00 PM  Result Value Ref Range   Glucose-Capillary 162 (H) 65 - 99 mg/dL   Comment 1 Notify RN    Comment 2 Document in Chart   Glucose, capillary     Status: Abnormal   Collection Time: 05/29/17  4:11 PM  Result Value Ref Range   Glucose-Capillary 138 (H) 65 - 99 mg/dL   Comment 1 Notify RN    Comment 2 Document in Chart   Glucose, capillary     Status: Abnormal   Collection Time: 05/29/17  9:05 PM  Result Value Ref Range   Glucose-Capillary 193 (H) 65 - 99 mg/dL  Basic metabolic panel     Status: Abnormal   Collection Time: 05/30/17  2:22 AM  Result Value Ref Range   Sodium 137 135 - 145 mmol/L   Potassium 4.5 3.5 - 5.1 mmol/L   Chloride 106 101 - 111 mmol/L   CO2 21 (L) 22 - 32 mmol/L   Glucose, Bld 144 (H) 65 - 99 mg/dL   BUN 58 (H) 6 - 20 mg/dL   Creatinine, Ser 5.15 (H) 0.61 - 1.24 mg/dL   Calcium 8.5 (L) 8.9 - 10.3 mg/dL   GFR calc non Af Amer 12 (L) >60 mL/min   GFR calc Af Amer 14 (L) >60 mL/min    Comment: (NOTE) The eGFR has been calculated using the CKD EPI equation. This calculation has not been validated in all clinical situations. eGFR's persistently <60 mL/min signify  possible Chronic Kidney Disease.    Anion gap 10 5 - 15    Comment: Performed at [facility] [address], [place], [place] [address]  Glucose, capillary     Status: Abnormal   Collection Time: 05/30/17  8:06 AM  Result Value Ref Range   Glucose-Capillary 126 (H)  65 - 99 mg/dL   Comment 1 Notify RN    Comment 2 Document in Chart   Basic metabolic panel     Status: Abnormal   Collection Time: 05/30/17 10:49 AM  Result Value Ref Range   Sodium 136 135 - 145 mmol/L   Potassium 4.5 3.5 - 5.1 mmol/L   Chloride 104 101 - 111 mmol/L   CO2 20 (L) 22 - 32 mmol/L   Glucose, Bld 161 (H) 65 - 99 mg/dL   BUN 54 (H) 6 - 20 mg/dL   Creatinine, Ser 5.08 (H) 0.61 - 1.24 mg/dL   Calcium 8.4 (L) 8.9 - 10.3 mg/dL   GFR calc non Af Amer 12 (L) >60 mL/min   GFR calc Af Amer 14 (L) >60 mL/min    Comment: (NOTE) The eGFR has been calculated using the CKD EPI equation. This calculation has not been validated in all clinical situations. eGFR's persistently <60 mL/min signify possible Chronic Kidney Disease.    Anion gap 12 5 - 15    Comment: Performed at [facility] [address] [place], [place] [address]  Glucose, capillary     Status: Abnormal   Collection Time: 05/30/17 12:29 PM  Result Value Ref Range   Glucose-Capillary 155 (H) 65 - 99 mg/dL   Comment 1 Notify RN    Comment 2 Document in Chart   Glucose, capillary     Status: Abnormal   Collection Time: 05/30/17  5:01 PM  Result Value Ref Range   Glucose-Capillary 229 (H) 65 - 99 mg/dL   Comment 1 Notify RN    Comment 2 Document in Chart   Glucose, capillary     Status: Abnormal   Collection Time: 05/30/17  9:11 PM  Result Value Ref Range   Glucose-Capillary 192 (H) 65 - 99 mg/dL   Comment 1 Notify RN    Comment 2 Document in Chart   Basic metabolic panel     Status: Abnormal   Collection Time: 05/31/17  2:41 AM  Result Value Ref Range   Sodium 136 135 - 145 mmol/L   Potassium 4.1 3.5 - 5.1 mmol/L   Chloride 105  101 - 111 mmol/L   CO2 20 (L) 22 - 32 mmol/L   Glucose, Bld 168 (H) 65 - 99 mg/dL   BUN 55 (H) 6 - 20 mg/dL   Creatinine, Ser 5.11 (H) 0.61 - 1.24 mg/dL   Calcium 8.3 (L) 8.9 - 10.3 mg/dL   GFR calc non Af Amer 12 (L) >60 mL/min   GFR calc Af Amer 14 (L) >60 mL/min    Comment: (NOTE) The eGFR has been calculated using the CKD EPI equation. This calculation has not been validated in all clinical situations. eGFR's persistently <60 mL/min signify possible Chronic Kidney Disease.    Anion gap 11 5 - 15    Comment: Performed at [facility] [address] [place], [place] [address]  Glucose, capillary     Status: Abnormal   Collection Time: 05/31/17  7:48 AM  Result Value Ref Range   Glucose-Capillary 143 (H) 65 - 99 mg/dL   Comment 1 Notify RN    Comment 2 Document in Chart    No results found.  Review of Systems  Constitutional: Negative for chills, fever and malaise/fatigue.  Respiratory: Negative for cough and shortness of breath.   Cardiovascular: Negative for chest pain and leg swelling.  Gastrointestinal: Negative for abdominal pain, blood in stool, diarrhea, nausea and  vomiting.  Genitourinary: Negative for dysuria, flank pain, hematuria and urgency.  Musculoskeletal: Negative for myalgias and neck pain.  Neurological: Positive for focal weakness. Negative for headaches.    Blood pressure (!) 171/88, pulse 72, temperature 98.7 F (37.1 C), temperature source Oral, resp. rate (!) 21, height '5\' 5"'  (1.651 m), weight 127 lb (57.6 kg), SpO2 96 %. Physical Exam  Constitutional: He is oriented to person, place, and time. He appears well-developed and well-nourished. No distress.  HENT:  Head: Normocephalic and atraumatic.  Mouth/Throat: Oropharynx is clear and moist.  Eyes: Pupils are equal, round, and reactive to light. EOM are normal.  Neck: Normal range of motion. Neck supple. No JVD present.  Cardiovascular: Normal rate and regular rhythm. Exam reveals no gallop  and no friction rub.  No murmur heard. Respiratory: Effort normal and breath sounds normal. He has no wheezes. He has no rales.  GI: Soft. Bowel sounds are normal. There is no tenderness.  Musculoskeletal: He exhibits no edema or tenderness.  Neurological: He is alert and oriented to person, place, and time.  Chronic LUE and LLE weakness  Skin: Skin is warm and dry.     Assessment/Plan AKI on CKD stage III: Secondary to uncontrolled HTN and NSAID use.  Patient endorses taking an entire bottle of ibuprofen a few weeks ago.  Was not aware that he had CKD.  Creatinine 5.11 (baseline 1.9), BUN 55, CO2 20, electrolytes WNL.  Renal ultrasound without obstructive uropathy or hydronephrosis.  Avoid nephrotoxic agents.  We will continue to treat with fluids.  Suspect there may be underlying ATN based on history and FENa.  Will check iron studies and PTH.    H/o CVA with residual left-sided weakness dysarthria: Old lacunar infarcts in basal ganglia and brainstem on head CT without acute findings.  Followed by PT.  Per primary.  CAD: On Plavix, aspirin, simvastatin, and Imdur.  HTN: Remains hypertensive 170s/80s.  Antihypertensives include amlodipine, carvedilol, hydralazine.  T2DM: Holding metformin in the setting of AKI.  CBGs mid 100s.  Continue insulin.  Per primary.  Tobacco use disorder: On nicotine patch.  Per primary.  Depression: On Zoloft.  Per primary.    Harriet Butte, DO, PGY-2

## 2017-06-01 LAB — RENAL FUNCTION PANEL
ANION GAP: 10 (ref 5–15)
Albumin: 2.9 g/dL — ABNORMAL LOW (ref 3.5–5.0)
BUN: 61 mg/dL — ABNORMAL HIGH (ref 6–20)
CHLORIDE: 106 mmol/L (ref 101–111)
CO2: 22 mmol/L (ref 22–32)
Calcium: 8.6 mg/dL — ABNORMAL LOW (ref 8.9–10.3)
Creatinine, Ser: 5.15 mg/dL — ABNORMAL HIGH (ref 0.61–1.24)
GFR calc non Af Amer: 12 mL/min — ABNORMAL LOW (ref >60)
GFR, EST AFRICAN AMERICAN: 14 mL/min — AB (ref >60)
Glucose, Bld: 103 mg/dL — ABNORMAL HIGH (ref 65–99)
Phosphorus: 6 mg/dL — ABNORMAL HIGH (ref 2.5–4.6)
Potassium: 4.5 mmol/L (ref 3.5–5.1)
Sodium: 138 mmol/L (ref 135–145)

## 2017-06-01 LAB — PROTEIN / CREATININE RATIO, URINE
Creatinine, Urine: 49.01 mg/dL
Protein Creatinine Ratio: 4.33 mg/mg{Cre} — ABNORMAL HIGH (ref 0.00–0.15)
Total Protein, Urine: 212 mg/dL

## 2017-06-01 LAB — PTH, INTACT AND CALCIUM
Calcium, Total (PTH): 8.3 mg/dL — ABNORMAL LOW (ref 8.7–10.2)
PTH: 162 pg/mL — ABNORMAL HIGH (ref 15–65)

## 2017-06-01 LAB — GLUCOSE, CAPILLARY
GLUCOSE-CAPILLARY: 237 mg/dL — AB (ref 65–99)
GLUCOSE-CAPILLARY: 181 mg/dL — AB (ref 65–99)
GLUCOSE-CAPILLARY: 213 mg/dL — AB (ref 65–99)
Glucose-Capillary: 118 mg/dL — ABNORMAL HIGH (ref 65–99)

## 2017-06-01 MED ORDER — SERTRALINE HCL 50 MG PO TABS
50.0000 mg | ORAL_TABLET | Freq: Every day | ORAL | Status: DC
  Administered 2017-06-02 – 2017-06-09 (×8): 50 mg via ORAL
  Filled 2017-06-01 (×8): qty 1

## 2017-06-01 NOTE — Progress Notes (Signed)
[facility] NEPHROLOGY PROGRESS NOTE  Assessment/ Plan: Pt is a 52 y.o.   African-American male with history of type 2 diabetes, hypertension for 2 to 3 years, coronary artery disease, tobacco abuse, CKD stage III with serum creatinine level of 1.9 on 03/2016, admitted with subacute stroke, found to have elevated serum creatinine level of 5.84 on admission.  Patient with hypertensive urgency and was reporting an increase amount of ibuprofen use  (bottles of) for long time.  His blood pressure was not controlled.  Consulted by Dr.Emokpae for the management of renal failure.  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  Acute rise in creatinine may also be contributed by excessive use of NSAIDs and hypertensive urgency.  He is nonoliguric, creatinine level stable at 5.1.  -Patient is nonoliguric.  UA with bacteria and UPCR 4.3 which could be due to NSAID use and diabetic nephropathy.  Follow-up hepatitis studies.  He is not uremic.  No indication for dialysis.  Continue to monitor. -  Ultrasound of kidneys with increased echogenicity and nonobstructive 11 mm calculi on the right kidney.  No hydronephrosis.  #Metabolic acidosis: Continue sodium bicarbonate.  #Anemia likely due to chronic kidney disease: Iron stores acceptable. Hold ESA because of uncontrolled hypertension and stroke.  Monitor CBC.  No sign of bleeding.  #Hypertension: Monitor blood pressure. Currently on amlodipine, Coreg, hydralazine, Imdur.  Monitor blood pressure.  Avoid hypotension because of stroke.   Subjective: Seen and examined at bedside.  Reported generalized weakness.  Denied nausea vomiting chest pain shortness of breath. Objective Vital signs in last 24 hours: Vitals:   05/31/17 2258 06/01/17 0331 06/01/17 0520 06/01/17 0738  BP: (!) 153/75 (!) 159/81  (!)  159/80  Pulse: 72 89  77  Resp: (!) 22 (!) 21  (!) 22  Temp: 98.4 F (36.9 C) 98.3 F (36.8 C)  98.7 F (37.1 C)  TempSrc: Oral Oral  Oral  SpO2: 97% 98%  96%  Weight:   58.1 kg (128 lb)   Height:       Weight change: 0.454 kg (1 lb)  Intake/Output Summary (Last 24 hours) at 06/01/2017 0818 Last data filed at 06/01/2017 0800 Gross per 24 hour  Intake 2727.25 ml  Output 2025 ml  Net 702.25 ml       Labs: Basic Metabolic Panel: Recent Labs  Lab 05/30/17 1049 05/31/17 0241 05/31/17 0954 06/01/17 0218  NA 136 136  --  138  K 4.5 4.1  --  4.5  CL 104 105  --  106  CO2 20* 20*  --  22  GLUCOSE 161* 168*  --  103*  BUN 54* 55*  --  61*  CREATININE 5.08* 5.11*  --  5.15*  CALCIUM 8.4* 8.3* 8.3* 8.6*  PHOS  --   --   --  6.0*   Liver Function Tests: Recent Labs  Lab 05/28/17 1253 06/01/17 0218  AST 15  --   ALT 12*  --   ALKPHOS 75  --   BILITOT 0.3  --   PROT 6.8  --   ALBUMIN 3.5 2.9*   Recent Labs  Lab 05/28/17 1253  LIPASE 52*   No results for input(s): AMMONIA in the last 168 hours. CBC: Recent Labs  Lab 05/28/17 1253 05/29/17 0614  WBC 7.4 6.7  NEUTROABS 5.4  --   HGB 10.9* 9.6*  HCT  31.8* 29.2*  MCV 91.1 91.3  PLT 216 202   Cardiac Enzymes: Recent Labs  Lab 05/28/17 1253 05/28/17 1737 05/28/17 2326 05/29/17 0614 05/29/17 1045  TROPONINI 0.04* 0.04* 0.04* 0.03* 0.03*   CBG: Recent Labs  Lab 05/31/17 0748 05/31/17 1227 05/31/17 1338 05/31/17 1705 05/31/17 2131  GLUCAP 143* 179* 161* 182* 261*    Iron Studies:  Recent Labs    05/31/17 0954  IRON 67  TIBC 234*  FERRITIN 48   Studies/Results: No results found.  Medications: Infusions: . sodium chloride 75 mL/hr at 05/31/17 2209    Scheduled Medications: . amLODipine  10 mg Oral Daily  . aspirin  81 mg Oral Daily  . carvedilol  25 mg Oral BID WC  . clopidogrel  75 mg Oral Daily  . guaiFENesin  600 mg Oral BID  . heparin  5,000 Units Subcutaneous Q8H  .  hydrALAZINE  100 mg Oral Q8H  . insulin aspart  0-5 Units Subcutaneous QHS  . insulin aspart  0-9 Units Subcutaneous TID WC  . isosorbide mononitrate  30 mg Oral Daily  . sertraline  25 mg Oral Daily  . simvastatin  20 mg Oral Daily  . sodium bicarbonate  1,300 mg Oral BID  . sodium chloride flush  3 mL Intravenous Q12H    have reviewed scheduled and prn medications.  Physical Exam: General:NAD, comfortable Heart:RRR, s1s2 nl Lungs:clear b/l, no craclle Abdomen:soft, Non-tender, non-distended Extremities:No edema   Dron Prasad Bhandari 06/01/2017,8:18 AM  LOS: 4 days

## 2017-06-01 NOTE — Progress Notes (Signed)
Patient Demographics:    Jeffery Young, is a 52 y.o. male, DOB - 1965/10/05, DGU:[id number]  Admit date - 05/28/2017   Admitting Physician [name], MD  Outpatient Primary MD for the patient is [name], MD  LOS - 4   Chief Complaint  Patient presents with  . Depression        Subjective:    Jeffery Young today has no fevers, no emesis,  No chest pain,   appetite remains poor, patient is voiding well  Assessment  & Plan :    Principal Problem:   AKI (acute kidney injury) on CKD III Active Problems:   Type 2 diabetes mellitus with diabetic nephropathy, without long-term current use of insulin (HCC)   Tobacco abuse   Coronary artery disease due to lipid rich plaque   Elevated troponin   Uncontrolled hypertension   Depression   Renal insufficiency   Normocytic anemia   History of completed stroke  1) AKI----acute kidney injury on CKD stage III -   patient is voiding well, creatinine still above 5, nephrology consult/input appreciated, bicarb added , suspect due to uncontrolled hypertension compounded by frequent ibuprofen use,  creatinine on admission=5.84 ,   baseline creatinine = 1.9    , creatinine is now= 5.11      ,  Avoid nephrotoxic agents/dehydration/hypotension, - Potassium wnl, no acidosis, renal ultrasound consistent with medical renal kidney disease without obstructive uropathy, suspect underlying CKD is due to hypertension and diabetic,   2) Hx of CVA with residual deficits - - CT head is negative for acute findings, but notable for old lacunar infarcts in basal ganglia and brainstem, as well as progression of microvascular ischemic disease , patient with left-sided weakness and speech problems  which are not new, - Continue ASA  and statin , (on plavix for CAD), no new/acute stroke this admission  3) Hypertension with hypertensive urgency - - much improved BP control overall,  on admission BP 190/111 in ED, asymptomatic , continue Coreg, c/n amlodipine, c/n hydralazine 100 mg 3 times daily and isosorbide 30 mg daily, patient admits to noncompliance with antihypertensive medication for a few days prior to admission, may use IV Hydralazine 10 mg  Every 4 hours Prn for systolic blood pressure over 160 mmhg. no evidence of acute/new stroke at this admission  4) Type II DM - - A1c was 6.9% , continue to hold metformin due to kidney concerns, Use Novolog/Humalog Sliding scale insulin with Accu-Cheks/Fingersticks as ordered  5. CAD; elevated troponin - - No anginal complaints , suspect due to severely elevated blood pressure with mild ischemia, - Hx of NSTEMI treated with DES to left circumflex ,  Last known EF 55-60%, , continue ASA, Plavix, Coreg, and Zocor  , troponin peaked at 0.04,.    6. Depression -  Denies SI, HI, or hallucinations , depressive symptoms Manifested in not taking his medications or going to work  - Social work consulted for available resources , c/n zoloft as ordered  7)Tobacco Abuse-  Smoking cessation advised  DVT prophylaxis: sq heparin  Code Status: Full   Disposition Plan  : ??? Homeless, prior to admission lives with his ex-wife  Consults  :  SW/nephrology  Lab Results  Component Value  Date   PLT 202 05/29/2017   Inpatient Medications  Scheduled Meds: . amLODipine  10 mg Oral Daily  . aspirin  81 mg Oral Daily  . carvedilol  25 mg Oral BID WC  . clopidogrel  75 mg Oral Daily  . guaiFENesin  600 mg Oral BID  . heparin  5,000 Units Subcutaneous Q8H  . hydrALAZINE  100 mg Oral Q8H  . insulin aspart  0-5 Units Subcutaneous QHS  . insulin aspart  0-9 Units Subcutaneous TID WC  . isosorbide mononitrate  30 mg Oral Daily  . [START ON 06/02/2017] sertraline  50 mg Oral Daily  . simvastatin  20 mg Oral Daily  . sodium bicarbonate  1,300 mg Oral BID  . sodium chloride flush  3 mL Intravenous Q12H   Continuous Infusions: . sodium  chloride 75 mL/hr at 05/31/17 2209   PRN Meds:.acetaminophen **OR** acetaminophen, albuterol, bisacodyl, hydrALAZINE, HYDROcodone-acetaminophen, ondansetron **OR** ondansetron (ZOFRAN) IV, senna-docusate    Anti-infectives (From admission, onward)   None        Objective:   Vitals:   06/01/17 0738 06/01/17 1100 06/01/17 1223 06/01/17 1700  BP: (!) 159/80  128/74 (!) 150/81  Pulse: 77  61   Resp: (!) 22 18    Temp: 98.7 F (37.1 C)  97.8 F (36.6 C) 97.9 F (36.6 C)  TempSrc: Oral  Oral Oral  SpO2: 96%  96% 97%  Weight:      Height:        Wt Readings from Last 3 Encounters:  06/01/17 58.1 kg (128 lb)  11/20/16 58.9 kg (129 lb 12.8 oz)  06/14/16 59.3 kg (130 lb 12.8 oz)     Intake/Output Summary (Last 24 hours) at 06/01/2017 1922 Last data filed at 06/01/2017 0900 Gross per 24 hour  Intake 2527.25 ml  Output 1350 ml  Net 1177.25 ml     Physical Exam  Gen:- Awake Alert,  In no apparent distress  HEENT:- [place].AT, No sclera icterus, lt eye EOM deficit is not new Neck-Supple Neck,No JVD,.  Lungs-  CTAB , good air movement CV- S1, S2 normal Abd-  +ve B.Sounds, Abd Soft, No tenderness,    Extremity/Skin:- No  edema,   good pulses Psych-affect is appropriate , oriented x3 Neuro-patient has some left-sided deficits from previous stroke, no new acute deficits this time around, but he does have mostly generalized weakness, speech defect appears to have resolved.   Data Review:   Micro Results Recent Results (from the past 240 hour(s))  MRSA PCR Screening     Status: None   Collection Time: 05/28/17  7:01 PM  Result Value Ref Range Status   MRSA by PCR NEGATIVE NEGATIVE Final    Comment:        The GeneXpert MRSA Assay (FDA approved for NASAL specimens only), is one component of a comprehensive MRSA colonization surveillance program. It is not intended to diagnose MRSA infection nor to guide or monitor treatment for MRSA infections. Performed at [facility], [place] [address], [place], [place] [address]     Radiology Reports Dg Chest 2 View  Result Date: 05/28/2017 CLINICAL DATA:  Hypertension EXAM: CHEST - 2 VIEW COMPARISON:  March 09, 2016 FINDINGS: Lungs are clear. Heart size and pulmonary vascularity are normal. No adenopathy. No bone lesions. IMPRESSION: No edema or consolidation. Electronically Signed   By: Lowella Grip III M.D.   On: 05/28/2017 14:33   Ct Head Wo Contrast  Result Date: 05/28/2017 CLINICAL  DATA:  52 y/o  M; MI 1 year ago with deterioration. EXAM: CT HEAD WITHOUT CONTRAST TECHNIQUE: Contiguous axial images were obtained from the base of the skull through the vertex without intravenous contrast. COMPARISON:  03/09/2016 CT head FINDINGS: Brain: No evidence of acute infarction, hemorrhage, hydrocephalus, extra-axial collection or mass lesion/mass effect. Small chronic lacunar infarcts are present within the bilateral lentiform nuclei, right thalamus, and right pons. A focus within the right putamen is new from the prior study. There is a background of chronic microvascular ischemic changes and parenchymal volume loss of the brain that is mildly progressed. Vascular: Calcific atherosclerosis of carotid siphons. No hyperdense vessel identified. Skull: Normal. Negative for fracture or focal lesion. Sinuses/Orbits: No acute finding. Other: Left intra-ocular lens replacement. IMPRESSION: 1. No acute intracranial abnormality identified. 2. Mild progression of chronic microvascular ischemic changes and parenchymal volume loss of the brain. Several small chronic lacunar infarcts in basal ganglia and brainstem. Electronically Signed   By: Kristine Garbe M.D.   On: 05/28/2017 13:30   US Renal  Result Date: 05/29/2017 CLINICAL DATA:  Renal failure. History of diabetes and hypertension. EXAM: RENAL / URINARY TRACT ULTRASOUND COMPLETE COMPARISON:  None. FINDINGS: Right Kidney: Length: 10.7 cm. Diffusely increased parenchymal  echotexture consistent with chronic medical renal disease. No hydronephrosis. Stone in the midpole right kidney measuring 11 mm diameter. Left Kidney: Length: 11.1 cm. Diffusely increased renal parenchymal echotexture consistent with chronic medical renal disease. No hydronephrosis or solid mass identified. Bladder: No bladder wall thickening or filling defects identified. IMPRESSION: 1. Diffusely increased parenchymal echotexture to both kidneys consistent with chronic medical renal disease. 2. Nonobstructing 11 mm stone in the right kidney. 3. No hydronephrosis. Electronically Signed   By: Lucienne Capers M.D.   On: 05/29/2017 00:32     CBC Recent Labs  Lab 05/28/17 1253 05/29/17 0614  WBC 7.4 6.7  HGB 10.9* 9.6*  HCT 31.8* 29.2*  PLT 216 202  MCV 91.1 91.3  MCH 31.2 30.0  MCHC 34.3 32.9  RDW 12.7 13.2  LYMPHSABS 1.3  --   MONOABS 0.5  --   EOSABS 0.1  --   BASOSABS 0.0  --     Chemistries  Recent Labs  Lab 05/28/17 1253 05/29/17 0614 05/30/17 0222 05/30/17 1049 05/31/17 0241 05/31/17 0954 06/01/17 0218  NA 136 139 137 136 136  --  138  K 4.6 4.2 4.5 4.5 4.1  --  4.5  CL 103 106 106 104 105  --  106  CO2 22 20* 21* 20* 20*  --  22  GLUCOSE 148* 146* 144* 161* 168*  --  103*  BUN 63* 61* 58* 54* 55*  --  61*  CREATININE 5.84* 5.38* 5.15* 5.08* 5.11*  --  5.15*  CALCIUM 8.7* 8.6* 8.5* 8.4* 8.3* 8.3* 8.6*  AST 15  --   --   --   --   --   --   ALT 12*  --   --   --   --   --   --   ALKPHOS 75  --   --   --   --   --   --   BILITOT 0.3  --   --   --   --   --   --    ------------------------------------------------------------------------------------------------------------------ No results for input(s): CHOL, HDL, LDLCALC, TRIG, CHOLHDL, LDLDIRECT in the last 72 hours.  Lab Results  Component Value Date   HGBA1C 6.9 (H) 05/29/2017   ------------------------------------------------------------------------------------------------------------------  No results for  input(s): TSH, T4TOTAL, T3FREE, THYROIDAB in the last 72 hours.  Invalid input(s): FREET3 ------------------------------------------------------------------------------------------------------------------ Recent Labs    05/31/17 0954  FERRITIN 48  TIBC 234*  IRON 67    Coagulation profile No results for input(s): INR, PROTIME in the last 168 hours.  No results for input(s): DDIMER in the last 72 hours.  Cardiac Enzymes Recent Labs  Lab 05/28/17 2326 05/29/17 0614 05/29/17 1045  TROPONINI 0.04* 0.03* 0.03*   ------------------------------------------------------------------------------------------------------------------ No results found for: BNP   Roxan Hockey M.D on 06/01/2017 at 7:22 PM  Between 7am to 7pm - Pager - [phone]  After 7pm go to [email or web address] - password [email or web address]  [facility] -  Office  [phone]   Voice Recognition Viviann Spare dictation system was used to create this note, attempts have been made to correct errors. Please contact the author with questions and/or clarifications.

## 2017-06-02 LAB — CBC
HEMATOCRIT: 28.9 % — AB (ref 39.0–52.0)
Hemoglobin: 9.2 g/dL — ABNORMAL LOW (ref 13.0–17.0)
MCH: 29.2 pg (ref 26.0–34.0)
MCHC: 31.8 g/dL (ref 30.0–36.0)
MCV: 91.7 fL (ref 78.0–100.0)
Platelets: 183 10*3/uL (ref 150–400)
RBC: 3.15 MIL/uL — ABNORMAL LOW (ref 4.22–5.81)
RDW: 13 % (ref 11.5–15.5)
WBC: 5.4 10*3/uL (ref 4.0–10.5)

## 2017-06-02 LAB — RENAL FUNCTION PANEL
ALBUMIN: 2.6 g/dL — AB (ref 3.5–5.0)
ANION GAP: 10 (ref 5–15)
BUN: 68 mg/dL — ABNORMAL HIGH (ref 6–20)
CALCIUM: 8.5 mg/dL — AB (ref 8.9–10.3)
CO2: 22 mmol/L (ref 22–32)
CREATININE: 5.49 mg/dL — AB (ref 0.61–1.24)
Chloride: 104 mmol/L (ref 101–111)
GFR calc non Af Amer: 11 mL/min — ABNORMAL LOW (ref >60)
GFR, EST AFRICAN AMERICAN: 13 mL/min — AB (ref >60)
Glucose, Bld: 175 mg/dL — ABNORMAL HIGH (ref 65–99)
PHOSPHORUS: 6 mg/dL — AB (ref 2.5–4.6)
Potassium: 4.1 mmol/L (ref 3.5–5.1)
SODIUM: 136 mmol/L (ref 135–145)

## 2017-06-02 LAB — GLUCOSE, CAPILLARY
GLUCOSE-CAPILLARY: 158 mg/dL — AB (ref 65–99)
Glucose-Capillary: 143 mg/dL — ABNORMAL HIGH (ref 65–99)
Glucose-Capillary: 242 mg/dL — ABNORMAL HIGH (ref 65–99)
Glucose-Capillary: 191 mg/dL — ABNORMAL HIGH (ref 65–99)

## 2017-06-02 NOTE — Progress Notes (Signed)
[facility] NEPHROLOGY PROGRESS NOTE  Assessment/ Plan: Pt is a 52 y.o.   African-American male with history of type 2 diabetes, hypertension for 2 to 3 years, coronary artery disease, tobacco abuse, CKD stage III with serum creatinine level of 1.9 on 03/2016, admitted with subacute stroke, found to have elevated serum creatinine level of 5.84 on admission.  Patient with hypertensive urgency and was reporting an increase amount of ibuprofen use  (bottles of) for long time.  His blood pressure was not controlled.  Consulted by Dr.Emokpae for the management of renal failure.  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  Acute rise in creatinine may also be contributed by excessive use of NSAIDs and hypertensive urgency.  -The serum creatinine level continued to elevated to 5.4 today.  He is not oliguric and dry on physical exam.  Agree with IV fluid.  Continue sodium bicarbonate.  -UA with bacteria and UPCR 4.3 which could be due to NSAID use and diabetic nephropathy.   -  Ultrasound of kidneys with increased echogenicity and nonobstructive 11 mm calculi on the right kidney.  No hydronephrosis. -Follow-up hepatitis panel.  Since creatinine is worsening I will check serology including immunofixation, complement, ANCA, ANA, hepatitis.  Follow-up with results.  He has no signs or symptoms of uremia.  No indication for dialysis.  Avoid nephrotoxins.  #Metabolic acidosis: Continue sodium bicarbonate.  Serum CO2 22.  #Anemia likely due to chronic kidney disease: Iron stores acceptable. Hold ESA because of uncontrolled hypertension and stroke.  Monitor CBC.  No sign of bleeding.  #Hypertension: Monitor blood pressure. Currently on amlodipine, Coreg, hydralazine, Imdur.  Monitor blood pressure.  Avoid hypotension because of stroke.   Subjective:  Seen and examined at bedside.  Denies chest pain, shortness of breath, nausea vomiting or headache. Objective Vital signs in last 24 hours: Vitals:   06/01/17 2310 06/02/17 0314 06/02/17 0558 06/02/17 0755  BP: (!) 161/79 (!) 148/74  (!) 154/81  Pulse: 67 70  69  Resp: 14 15  16   Temp: 98.2 F (36.8 C) 98.5 F (36.9 C)  (!) 97.5 F (36.4 C)  TempSrc: Oral Oral  Oral  SpO2: 95% 92%  95%  Weight:   59.8 kg (131 lb 13.4 oz)   Height:       Weight change: 1.74 kg (3 lb 13.4 oz)  Intake/Output Summary (Last 24 hours) at 06/02/2017 0852 Last data filed at 06/02/2017 0706 Gross per 24 hour  Intake 280 ml  Output 925 ml  Net -645 ml       Labs: Basic Metabolic Panel: Recent Labs  Lab 05/31/17 0241 05/31/17 0954 06/01/17 0218 06/02/17 0228  NA 136  --  138 136  K 4.1  --  4.5 4.1  CL 105  --  106 104  CO2 20*  --  22 22  GLUCOSE 168*  --  103* 175*  BUN 55*  --  61* 68*  CREATININE 5.11*  --  5.15* 5.49*  CALCIUM 8.3* 8.3* 8.6* 8.5*  PHOS  --   --  6.0* 6.0*   Liver Function Tests: Recent Labs  Lab 05/28/17 1253 06/01/17 0218 06/02/17 0228  AST 15  --   --   ALT 12*  --   --   ALKPHOS 75  --   --   BILITOT 0.3  --   --   PROT  6.8  --   --   ALBUMIN 3.5 2.9* 2.6*   Recent Labs  Lab 05/28/17 1253  LIPASE 52*   No results for input(s): AMMONIA in the last 168 hours. CBC: Recent Labs  Lab 05/28/17 1253 05/29/17 0614 06/02/17 0228  WBC 7.4 6.7 5.4  NEUTROABS 5.4  --   --   HGB 10.9* 9.6* 9.2*  HCT 31.8* 29.2* 28.9*  MCV 91.1 91.3 91.7  PLT 216 202 183   Cardiac Enzymes: Recent Labs  Lab 05/28/17 1253 05/28/17 1737 05/28/17 2326 05/29/17 0614 05/29/17 1045  TROPONINI 0.04* 0.04* 0.04* 0.03* 0.03*   CBG: Recent Labs  Lab 06/01/17 0846 06/01/17 1228 06/01/17 1727 06/01/17 2157 06/02/17 0759  GLUCAP 118* 237* 181* 213* 143*    Iron Studies:  Recent Labs    05/31/17 0954  IRON 67  TIBC 234*  FERRITIN 48   Studies/Results: No  results found.  Medications: Infusions: . sodium chloride 75 mL/hr at 05/31/17 2209    Scheduled Medications: . amLODipine  10 mg Oral Daily  . aspirin  81 mg Oral Daily  . carvedilol  25 mg Oral BID WC  . clopidogrel  75 mg Oral Daily  . guaiFENesin  600 mg Oral BID  . heparin  5,000 Units Subcutaneous Q8H  . hydrALAZINE  100 mg Oral Q8H  . insulin aspart  0-5 Units Subcutaneous QHS  . insulin aspart  0-9 Units Subcutaneous TID WC  . isosorbide mononitrate  30 mg Oral Daily  . sertraline  50 mg Oral Daily  . simvastatin  20 mg Oral Daily  . sodium bicarbonate  1,300 mg Oral BID  . sodium chloride flush  3 mL Intravenous Q12H    have reviewed scheduled and prn medications.  Physical Exam: General:NAD, comfortable Heart:RRR, s1s2 nl Lungs: Clear b/l, no crackle Abdomen:soft, Non-tender, non-distended Extremities: Edema   Dron Tanna Furry 06/02/2017,8:52 AM  LOS: 5 days

## 2017-06-02 NOTE — Progress Notes (Signed)
Patient Demographics:    Jeffery Young, is a 52 y.o. male, DOB - October 28, 1965, RDE:[id number]  Admit date - 05/28/2017   Admitting Physician [name], MD  Outpatient Primary MD for the patient is [name], MD  LOS - 5   Chief Complaint  Patient presents with  . Depression        Subjective:    Jeffery Young today has no fevers, no emesis,  No chest pain,  , patient is voiding well, no new complaints  Assessment  & Plan :    Principal Problem:   AKI (acute kidney injury) on CKD III Active Problems:   Type 2 diabetes mellitus with diabetic nephropathy, without long-term current use of insulin (HCC)   Tobacco abuse   Coronary artery disease due to lipid rich plaque   Elevated troponin   Uncontrolled hypertension   Depression   Renal insufficiency   Normocytic anemia   History of completed stroke  1) AKI----acute kidney injury on CKD stage III -   patient is voiding well, creatinine still above 5, nephrology consult/input appreciated, bicarb added , suspect due to uncontrolled hypertension compounded by frequent ibuprofen use,  creatinine on admission=5.84 ,   baseline creatinine = 1.9    , creatinine is now= back up to  5.49      ,  Avoid nephrotoxic agents/dehydration/hypotension, - Potassium wnl, no acidosis, renal ultrasound consistent with medical renal kidney disease without obstructive uropathy, suspect underlying CKD is due to hypertension and diabetic, give persistent elevation of creatinine check immunofixation, complement, ANCA, ANA, hepatitis  2) Hx of CVA with residual deficits - - CT head is negative for acute findings, but notable for old lacunar infarcts in basal ganglia and brainstem, as well as progression of microvascular ischemic disease , patient with left-sided weakness and speech problems  which are not new, - Continue ASA  and statin , (on plavix for CAD), no new/acute  stroke this admission, patient has generalized weakness as well as residual left-sided weakness, PT eval appreciated, patient needs skilled nursing facility for rehab  3) Hypertension with hypertensive urgency - - BP is improved , on admission BP 190/111 in ED, asymptomatic , continue Coreg, c/n amlodipine, c/n hydralazine 100 mg 3 times daily and isosorbide 30 mg daily, patient admits to noncompliance with antihypertensive medication for a few days prior to admission, may use IV Hydralazine 10 mg  Every 4 hours Prn for systolic blood pressure over 160 mmhg. no evidence of acute/new stroke at this admission  4) Type II DM - - A1c was 6.9% , continue to hold metformin due to kidney concerns, Use Novolog/Humalog Sliding scale insulin with Accu-Cheks/Fingersticks as ordered  5. CAD; elevated troponin - - No anginal complaints , suspect due to severely elevated blood pressure with mild ischemia, - Hx of NSTEMI treated with DES to left circumflex ,  Last known EF 55-60%, , continue ASA, Plavix, Coreg, and Zocor  , troponin peaked at 0.04,.    6. Depression -  Denies SI, HI, or hallucinations , depressive symptoms Manifested in not taking his medications or going to work ,  Social work consulted for available resources , c/n zoloft as ordered  7)Tobacco Abuse-  Smoking cessation counseling for 4 minutes  today, consider nicotine patch      DVT prophylaxis: sq heparin  Code Status: Full   Disposition Plan  : ??? Homeless, prior to admission lives with his ex-wife, Phy therapist recommends SNF rehab awaiting transfer to skilled nursing facility when bed is available  Consults  :  SW/nephrology/PT  Lab Results  Component Value Date   PLT 183 06/02/2017   Inpatient Medications  Scheduled Meds: . amLODipine  10 mg Oral Daily  . aspirin  81 mg Oral Daily  . carvedilol  25 mg Oral BID WC  . clopidogrel  75 mg Oral Daily  . guaiFENesin  600 mg Oral BID  . heparin  5,000 Units Subcutaneous  Q8H  . hydrALAZINE  100 mg Oral Q8H  . insulin aspart  0-5 Units Subcutaneous QHS  . insulin aspart  0-9 Units Subcutaneous TID WC  . isosorbide mononitrate  30 mg Oral Daily  . sertraline  50 mg Oral Daily  . simvastatin  20 mg Oral Daily  . sodium bicarbonate  1,300 mg Oral BID  . sodium chloride flush  3 mL Intravenous Q12H   Continuous Infusions: . sodium chloride 75 mL/hr at 06/02/17 0755   PRN Meds:.acetaminophen **OR** acetaminophen, albuterol, bisacodyl, hydrALAZINE, HYDROcodone-acetaminophen, ondansetron **OR** ondansetron (ZOFRAN) IV, senna-docusate    Anti-infectives (From admission, onward)   None        Objective:   Vitals:   06/02/17 0314 06/02/17 0558 06/02/17 0755 06/02/17 1100  BP: (!) 148/74  (!) 154/81 (!) 144/75  Pulse: 70  69 69  Resp: 15  16 18   Temp: 98.5 F (36.9 C)  (!) 97.5 F (36.4 C) 98.9 F (37.2 C)  TempSrc: Oral  Oral Oral  SpO2: 92%  95% 96%  Weight:  59.8 kg (131 lb 13.4 oz)    Height:        Wt Readings from Last 3 Encounters:  06/02/17 59.8 kg (131 lb 13.4 oz)  11/20/16 58.9 kg (129 lb 12.8 oz)  06/14/16 59.3 kg (130 lb 12.8 oz)     Intake/Output Summary (Last 24 hours) at 06/02/2017 1454 Last data filed at 06/02/2017 1200 Gross per 24 hour  Intake 2001.25 ml  Output 1425 ml  Net 576.25 ml     Physical Exam  Gen:- Awake Alert,  In no apparent distress  HEENT:- [place].AT, No sclera icterus, lt eye EOM deficit is not new Neck-Supple Neck,No JVD,.  Lungs-  CTAB , good air movement CV- S1, S2 normal Abd-  +ve B.Sounds, Abd Soft, No tenderness,    Extremity/Skin:- No  edema,   good pulses Psych-affect is appropriate , oriented x3 Neuro-patient has some left-sided deficits from previous stroke, no new acute deficits this time around, but he does have mostly generalized weakness, speech defect appears to have resolved.   Data Review:   Micro Results Recent Results (from the past 240 hour(s))  MRSA PCR Screening     Status:  None   Collection Time: 05/28/17  7:01 PM  Result Value Ref Range Status   MRSA by PCR NEGATIVE NEGATIVE Final    Comment:        The GeneXpert MRSA Assay (FDA approved for NASAL specimens only), is one component of a comprehensive MRSA colonization surveillance program. It is not intended to diagnose MRSA infection nor to guide or monitor treatment for MRSA infections. Performed at [facility], [place] [address], [place], [place] [address]     Radiology Reports Dg Chest 2 View  Result Date: 05/28/2017 CLINICAL DATA:  Hypertension EXAM: CHEST - 2 VIEW COMPARISON:  March 09, 2016 FINDINGS: Lungs are clear. Heart size and pulmonary vascularity are normal. No adenopathy. No bone lesions. IMPRESSION: No edema or consolidation. Electronically Signed   By: Lowella Grip III M.D.   On: 05/28/2017 14:33   Ct Head Wo Contrast  Result Date: 05/28/2017 CLINICAL DATA:  52 y/o  M; MI 1 year ago with deterioration. EXAM: CT HEAD WITHOUT CONTRAST TECHNIQUE: Contiguous axial images were obtained from the base of the skull through the vertex without intravenous contrast. COMPARISON:  03/09/2016 CT head FINDINGS: Brain: No evidence of acute infarction, hemorrhage, hydrocephalus, extra-axial collection or mass lesion/mass effect. Small chronic lacunar infarcts are present within the bilateral lentiform nuclei, right thalamus, and right pons. A focus within the right putamen is new from the prior study. There is a background of chronic microvascular ischemic changes and parenchymal volume loss of the brain that is mildly progressed. Vascular: Calcific atherosclerosis of carotid siphons. No hyperdense vessel identified. Skull: Normal. Negative for fracture or focal lesion. Sinuses/Orbits: No acute finding. Other: Left intra-ocular lens replacement. IMPRESSION: 1. No acute intracranial abnormality identified. 2. Mild progression of chronic microvascular ischemic changes and parenchymal volume loss of  the brain. Several small chronic lacunar infarcts in basal ganglia and brainstem. Electronically Signed   By: Kristine Garbe M.D.   On: 05/28/2017 13:30   US Renal  Result Date: 05/29/2017 CLINICAL DATA:  Renal failure. History of diabetes and hypertension. EXAM: RENAL / URINARY TRACT ULTRASOUND COMPLETE COMPARISON:  None. FINDINGS: Right Kidney: Length: 10.7 cm. Diffusely increased parenchymal echotexture consistent with chronic medical renal disease. No hydronephrosis. Stone in the midpole right kidney measuring 11 mm diameter. Left Kidney: Length: 11.1 cm. Diffusely increased renal parenchymal echotexture consistent with chronic medical renal disease. No hydronephrosis or solid mass identified. Bladder: No bladder wall thickening or filling defects identified. IMPRESSION: 1. Diffusely increased parenchymal echotexture to both kidneys consistent with chronic medical renal disease. 2. Nonobstructing 11 mm stone in the right kidney. 3. No hydronephrosis. Electronically Signed   By: Lucienne Capers M.D.   On: 05/29/2017 00:32     CBC Recent Labs  Lab 05/28/17 1253 05/29/17 0614 06/02/17 0228  WBC 7.4 6.7 5.4  HGB 10.9* 9.6* 9.2*  HCT 31.8* 29.2* 28.9*  PLT 216 202 183  MCV 91.1 91.3 91.7  MCH 31.2 30.0 29.2  MCHC 34.3 32.9 31.8  RDW 12.7 13.2 13.0  LYMPHSABS 1.3  --   --   MONOABS 0.5  --   --   EOSABS 0.1  --   --   BASOSABS 0.0  --   --     Chemistries  Recent Labs  Lab 05/28/17 1253  05/30/17 0222 05/30/17 1049 05/31/17 0241 05/31/17 0954 06/01/17 0218 06/02/17 0228  NA 136   < > 137 136 136  --  138 136  K 4.6   < > 4.5 4.5 4.1  --  4.5 4.1  CL 103   < > 106 104 105  --  106 104  CO2 22   < > 21* 20* 20*  --  22 22  GLUCOSE 148*   < > 144* 161* 168*  --  103* 175*  BUN 63*   < > 58* 54* 55*  --  61* 68*  CREATININE 5.84*   < > 5.15* 5.08* 5.11*  --  5.15* 5.49*  CALCIUM 8.7*   < > 8.5* 8.4* 8.3* 8.3*  8.6* 8.5*  AST 15  --   --   --   --   --   --   --   ALT  12*  --   --   --   --   --   --   --   ALKPHOS 75  --   --   --   --   --   --   --   BILITOT 0.3  --   --   --   --   --   --   --    < > = values in this interval not displayed.   ------------------------------------------------------------------------------------------------------------------ No results for input(s): CHOL, HDL, LDLCALC, TRIG, CHOLHDL, LDLDIRECT in the last 72 hours.  Lab Results  Component Value Date   HGBA1C 6.9 (H) 05/29/2017   ------------------------------------------------------------------------------------------------------------------ No results for input(s): TSH, T4TOTAL, T3FREE, THYROIDAB in the last 72 hours.  Invalid input(s): FREET3 ------------------------------------------------------------------------------------------------------------------ Recent Labs    05/31/17 0954  FERRITIN 48  TIBC 234*  IRON 67    Coagulation profile No results for input(s): INR, PROTIME in the last 168 hours.  No results for input(s): DDIMER in the last 72 hours.  Cardiac Enzymes Recent Labs  Lab 05/28/17 2326 05/29/17 0614 05/29/17 1045  TROPONINI 0.04* 0.03* 0.03*   ------------------------------------------------------------------------------------------------------------------ No results found for: BNP   Roxan Hockey M.D on 06/02/2017 at 2:54 PM  Between 7am to 7pm - Pager - [phone]  After 7pm go to [email or web address] - password [email or web address]  [facility] -  Office  [phone]   Voice Recognition Viviann Spare dictation system was used to create this note, attempts have been made to correct errors. Please contact the author with questions and/or clarifications.

## 2017-06-02 NOTE — Progress Notes (Signed)
Wife, Marval Regal, is at bedside requesting to speak w/CSW.  List of facilities provided to wife yesterday for pending discharge to SNF for rehab.  VM left for CSW to return call and speak w/wife.

## 2017-06-02 NOTE — Plan of Care (Signed)
Patient continues to progress slowly toward care goals.  Patient ambulated approx 125 ft today w/SBA x 1 pushing IV pole.  Patient also sat in bedside chair for approx six hours so far today.  Chronic back pain c/o 8/10 - took one Hydrocodone tablet for pain which was effective.  Continued discussion re: self care once discharged.  Will continue to monitor progress.

## 2017-06-03 LAB — GLUCOSE, CAPILLARY
GLUCOSE-CAPILLARY: 179 mg/dL — AB (ref 65–99)
GLUCOSE-CAPILLARY: 157 mg/dL — AB (ref 65–99)
Glucose-Capillary: 134 mg/dL — ABNORMAL HIGH (ref 65–99)
Glucose-Capillary: 177 mg/dL — ABNORMAL HIGH (ref 65–99)

## 2017-06-03 LAB — RENAL FUNCTION PANEL
ALBUMIN: 2.8 g/dL — AB (ref 3.5–5.0)
ANION GAP: 9 (ref 5–15)
BUN: 66 mg/dL — ABNORMAL HIGH (ref 6–20)
CO2: 21 mmol/L — ABNORMAL LOW (ref 22–32)
Calcium: 8.6 mg/dL — ABNORMAL LOW (ref 8.9–10.3)
Chloride: 107 mmol/L (ref 101–111)
Creatinine, Ser: 5.5 mg/dL — ABNORMAL HIGH (ref 0.61–1.24)
GFR calc Af Amer: 13 mL/min — ABNORMAL LOW (ref >60)
GFR calc non Af Amer: 11 mL/min — ABNORMAL LOW (ref >60)
GLUCOSE: 173 mg/dL — AB (ref 65–99)
PHOSPHORUS: 6.1 mg/dL — AB (ref 2.5–4.6)
POTASSIUM: 4.2 mmol/L (ref 3.5–5.1)
Sodium: 137 mmol/L (ref 135–145)

## 2017-06-03 LAB — HEPATITIS PANEL, ACUTE
HEP A IGM: NEGATIVE
HEP B C IGM: NEGATIVE
Hepatitis B Surface Ag: NEGATIVE

## 2017-06-03 LAB — C3 COMPLEMENT: C3 Complement: 93 mg/dL (ref 82–167)

## 2017-06-03 LAB — ANA W/REFLEX IF POSITIVE: Anti Nuclear Antibody(ANA): NEGATIVE

## 2017-06-03 LAB — C4 COMPLEMENT: COMPLEMENT C4, BODY FLUID: 22 mg/dL (ref 14–44)

## 2017-06-03 LAB — GLOMERULAR BASEMENT MEMBRANE ANTIBODIES: GBM Ab: 4 units (ref 0–20)

## 2017-06-03 MED ORDER — FERUMOXYTOL INJECTION 510 MG/17 ML
510.0000 mg | Freq: Once | INTRAVENOUS | Status: AC
  Administered 2017-06-03: 510 mg via INTRAVENOUS
  Filled 2017-06-03: qty 17

## 2017-06-03 MED ORDER — LIVING WELL WITH DIABETES BOOK
Freq: Once | Status: AC
  Administered 2017-06-03: 09:00:00
  Filled 2017-06-03 (×2): qty 1

## 2017-06-03 MED ORDER — AMLODIPINE BESYLATE 10 MG PO TABS
10.0000 mg | ORAL_TABLET | Freq: Every day | ORAL | Status: DC
  Administered 2017-06-03 – 2017-06-08 (×6): 10 mg via ORAL
  Filled 2017-06-03 (×6): qty 1

## 2017-06-03 MED ORDER — CALCITRIOL 0.25 MCG PO CAPS
0.2500 ug | ORAL_CAPSULE | Freq: Every day | ORAL | Status: DC
Start: 2017-06-03 — End: 2017-06-09
  Administered 2017-06-03 – 2017-06-09 (×7): 0.25 ug via ORAL
  Filled 2017-06-03 (×7): qty 1

## 2017-06-03 MED ORDER — DARBEPOETIN ALFA 150 MCG/0.3ML IJ SOSY
150.0000 ug | PREFILLED_SYRINGE | INTRAMUSCULAR | Status: DC
  Administered 2017-06-03: 150 ug via SUBCUTANEOUS
  Filled 2017-06-03: qty 0.3

## 2017-06-03 MED ORDER — ISOSORBIDE MONONITRATE ER 60 MG PO TB24
60.0000 mg | ORAL_TABLET | Freq: Every day | ORAL | Status: DC
  Administered 2017-06-03: 60 mg via ORAL
  Filled 2017-06-03: qty 1

## 2017-06-03 NOTE — Progress Notes (Signed)
Subjective: Interval History: has no complaint .  Objective: Vital signs in last 24 hours: Temp:  [97.7 F (36.5 C)-98.9 F (37.2 C)] 98.6 F (37 C) (04/29 0707) Pulse Rate:  [61-69] 69 (04/29 0707) Resp:  [16-18] 18 (04/29 0707) BP: (128-163)/(75-85) 152/76 (04/29 0707) SpO2:  [93 %-96 %] 95 % (04/29 0707) Weight change:   Intake/Output from previous day: 04/28 0701 - 04/29 0700 In: 3686.3 [I.V.:3686.3] Out: 2150 [Urine:2150] Intake/Output this shift: No intake/output data recorded.  General appearance: alert, cooperative and no distress Resp: diminished breath sounds bilaterally Cardio: S1, S2 normal and systolic murmur: systolic ejection 2/6, decrescendo at 2nd left intercostal space GI: soft, non-tender; bowel sounds normal; no masses,  no organomegaly Extremities: extremities normal, atraumatic, no cyanosis or edema  Lab Results: Recent Labs    06/02/17 0228  WBC 5.4  HGB 9.2*  HCT 28.9*  PLT 183   BMET:  Recent Labs    06/02/17 0228 06/03/17 0456  NA 136 137  K 4.1 4.2  CL 104 107  CO2 22 21*  GLUCOSE 175* 173*  BUN 68* 66*  CREATININE 5.49* 5.50*  CALCIUM 8.5* 8.6*   Recent Labs    05/31/17 0954  PTH 162*  Comment   Iron Studies:  Recent Labs    05/31/17 0954  IRON 67  TIBC 234*  FERRITIN 48    Studies/Results: No results found.  I have reviewed the patient's current medications.  Assessment/Plan: 1 CKD 5, ? Mild AKI . Appears to be plateauing.  Mild acidemia, anemia, HPTH 2 HTN controlled, will consider addition of diuretic 3 Anemia esa/Fe 4 MA will use Na bicarb 5 DM controlled 6 HPTH add Calcitriol P vit D, esa, FE,      LOS: 6 days   Jeneen Rinks Britta Louth 06/03/2017,8:54 AM

## 2017-06-03 NOTE — Progress Notes (Signed)
Physical Therapy Treatment Patient Details Name: Jeffery Young MRN: [id number] DOB: [birth year] Today's Date: 06/03/2017    History of Present Illness Pt is a 52 y.o. male admitted 05/28/17 for evaluation of multiple complaints, including depression, gait abnormality, LLE weakness, and gait abnormality over the past several months; pt has refused to seek evaluation up until this point. Head CT negative for acute abnormality; notable for progression of microvascular ischemic changes and several small chronic lacunar infarcts in the basal ganglia and brainstem. Pt also with uncontrolled HTN and worsening renal function; AKI on CKD III. PMH includes CAD, HTN, tobacco abuse, DMII, NSTEMI (12/2015).      PT Comments    Pt not overly excited to mobilize stating he didn't sleep well last night. Pt educated for balance deficits, need for use of RW with gait, fall risk, BE FAST for stroke education, and plan for progression. Pt reports he has no intention of returning to work and has had 5 falls in the last year between work and home. Pt with decreased awareness of deficits and fall risk. Will continue to follow.     Follow Up Recommendations  SNF;Supervision for mobility/OOB     Equipment Recommendations  Rolling walker with 5" wheels    Recommendations for Other Services       Precautions / Restrictions Precautions Precautions: Fall Restrictions Weight Bearing Restrictions: No    Mobility  Bed Mobility   Bed Mobility: Supine to Sit     Supine to sit: Supervision     General bed mobility comments: increased time with HOB 30degrees  Transfers Overall transfer level: Needs assistance   Transfers: Sit to/from Stand Sit to Stand: Min guard         General transfer comment: guarding for balance with deficits  Ambulation/Gait Ambulation/Gait assistance: Min guard Ambulation Distance (Feet): 300 Feet Assistive device: Rolling walker (2 wheeled) Gait Pattern/deviations:  Step-through pattern;Decreased stride length;Decreased dorsiflexion - left;Decreased weight shift to left   Gait velocity interpretation: <1.8 ft/sec, indicate of risk for recurrent falls General Gait Details: pt able to walk with RW with increased tolerance and stability but veering left without awareness and hitting obstacles x 2 despite cues   Stairs             Wheelchair Mobility    Modified Rankin (Stroke Patients Only) Modified Rankin (Stroke Patients Only) Pre-Morbid Rankin Score: No significant disability Modified Rankin: Moderately severe disability     Balance Overall balance assessment: Needs assistance   Sitting balance-Leahy Scale: Good       Standing balance-Leahy Scale: Fair   Single Leg Stance - Right Leg: 0 Single Leg Stance - Left Leg: 0 Tandem Stance - Right Leg: 20 Tandem Stance - Left Leg: 20 Rhomberg - Eyes Opened: 60 Rhomberg - Eyes Closed: 20   High Level Balance Comments: pt able to perform partial tandem stance with assist to step but unable to perform full heel to toe, pt unable to SLS, LOB with eyes closed. Pt educated for fall implication with balance deficits and need for RW with gait             Cognition Arousal/Alertness: Awake/alert Behavior During Therapy: Flat affect Overall Cognitive Status: Impaired/Different from baseline Area of Impairment: Attention;Following commands;Safety/judgement;Awareness;Problem solving;Memory;Orientation                 Orientation Level: Time;Disoriented to Current Attention Level: Selective Memory: Decreased short-term memory Following Commands: Follows one step commands consistently Safety/Judgement: Decreased awareness of safety;Decreased awareness of  deficits Awareness: Emergent Problem Solving: Requires verbal cues;Slow processing General Comments: pt not aware of day, required verbal cues for direction management to return to room, Pt veering left with gait with no awareness       Exercises      General Comments        Pertinent Vitals/Pain Pain Score: 4  Pain Location: Lower back Pain Descriptors / Indicators: Constant;Sore Pain Intervention(s): Limited activity within patient's tolerance;Repositioned    Home Living                      Prior Function            PT Goals (current goals can now be found in the care plan section) Progress towards PT goals: Progressing toward goals    Frequency           PT Plan Current plan remains appropriate    Co-evaluation              AM-PAC PT "6 Clicks" Daily Activity  Outcome Measure  Difficulty turning over in bed (including adjusting bedclothes, sheets and blankets)?: A Little Difficulty moving from lying on back to sitting on the side of the bed? : A Little Difficulty sitting down on and standing up from a chair with arms (e.g., wheelchair, bedside commode, etc,.)?: A Little Help needed moving to and from a bed to chair (including a wheelchair)?: A Little Help needed walking in hospital room?: A Little Help needed climbing 3-5 steps with a railing? : A Little 6 Click Score: 18    End of Session Equipment Utilized During Treatment: Gait belt Activity Tolerance: Patient tolerated treatment well Patient left: in chair;with call bell/phone within reach;with chair alarm set Nurse Communication: Mobility status PT Visit Diagnosis: Other abnormalities of gait and mobility (R26.89);Other symptoms and signs involving the nervous system (R29.898)     Time: 5102-5852 PT Time Calculation (min) (ACUTE ONLY): 23 min  Charges:  $Gait Training: 8-22 mins $Neuromuscular Re-education: 8-22 mins                    G Codes:       Elwyn Reach, PT [phone]    [place] 06/03/2017, 10:57 AM

## 2017-06-03 NOTE — Progress Notes (Signed)
Clinical Social Worker received phone call back from patients wife. Patients wife Marval Regal chose to send patient to [facility] for short term rehab. CSW reached out to admission coordinator for facility and they stated they are able to take patient once authorization through insurance has been attained.   Rhea Pink, MSW,  [place]

## 2017-06-03 NOTE — Progress Notes (Signed)
Clinical Social Worker following patient for support and discharge needs. Patient was given bed offers but stated his wife will be the one to make a decision. CSW reached out to patient wife via phone and left a voicemail for her to return CSW calls.   Rhea Pink, MSW,  [place]

## 2017-06-03 NOTE — Progress Notes (Signed)
Patient Demographics:    Jeffery Young, is a 52 y.o. male, DOB - 1965/11/02, OYD:[id number]  Admit date - 05/28/2017   Admitting Physician [name], MD  Outpatient Primary MD for the patient is [name], MD  LOS - 6   Chief Complaint  Patient presents with  . Depression        Subjective:    Jeffery Young today has no fevers, no emesis,  No chest pain,  , eating okay, continues to void well without any dysuria, awaiting transfer to skilled nursing facility when bed becomes available  Assessment  & Plan :    Principal Problem:   AKI (acute kidney injury) on CKD III Active Problems:   Type 2 diabetes mellitus with diabetic nephropathy, without long-term current use of insulin (HCC)   Tobacco abuse   Coronary artery disease due to lipid rich plaque   Elevated troponin   Uncontrolled hypertension   Depression   Renal insufficiency   Normocytic anemia   History of completed stroke  1) AKI----acute kidney injury on CKD stage III -   patient is voiding well, creatinine still above 5, nephrology consult/input appreciated, bicarb added , suspect due to uncontrolled hypertension compounded by frequent ibuprofen use,  creatinine on admission=5.84 ,   baseline creatinine = 1.9    , creatinine is now= back up to  5.49      ,  Avoid nephrotoxic agents/dehydration/hypotension, - Potassium wnl, no acidosis, renal ultrasound consistent with medical renal kidney disease without obstructive uropathy, suspect underlying CKD is due to hypertension and diabetic, give persistent elevation of creatinine check immunofixation and ANCA, ANA are pending, however complement studies are unremarkable,  viral hepatitis panel is unremarkable, glomerular basement antibodies are negative  2) Hx of CVA with residual deficits - - CT head is negative for acute findings, but notable for old lacunar infarcts in basal ganglia and  brainstem, as well as progression of microvascular ischemic disease , patient with left-sided weakness and speech problems  which are not new, - Continue ASA  and statin , (on plavix for CAD), no new/acute stroke this admission, patient has generalized weakness as well as residual left-sided weakness, PT eval appreciated, patient needs skilled nursing facility for rehab  3) Hypertension with hypertensive urgency - - BP is improved , on admission BP 190/111 in ED, asymptomatic , continue Coreg, c/n amlodipine, c/n hydralazine 100 mg 3 times daily and isosorbide 30 mg daily, patient admits to noncompliance with antihypertensive medication for a few days prior to admission, may use IV Hydralazine 10 mg  Every 4 hours Prn for systolic blood pressure over 160 mmhg. no evidence of acute/new stroke at this admission  4) Type II DM - - A1c was 6.9% , continue to hold metformin due to kidney concerns, Use Novolog/Humalog Sliding scale insulin with Accu-Cheks/Fingersticks as ordered  5. CAD; elevated troponin - - No anginal complaints , suspect due to severely elevated blood pressure with mild ischemia, - Hx of NSTEMI treated with DES to left circumflex ,  Last known EF 55-60%, , continue ASA, Plavix, Coreg, and Zocor  , troponin peaked at 0.04,.    6. Depression -  Denies SI, HI, or hallucinations , depressive symptoms Manifested in not taking  his medications or going to work ,  Social work consulted for available resources , c/n zoloft as ordered  7)Tobacco Abuse-  Smoking cessation advised  DVT prophylaxis: sq heparin  Code Status: Full   Disposition Plan  : ??? Homeless, prior to admission lives with his ex-wife, Phy therapist recommends SNF rehab awaiting transfer to skilled nursing facility when bed is available  Consults  :  SW/nephrology/PT  Lab Results  Component Value Date   PLT 183 06/02/2017   Inpatient Medications  Scheduled Meds: . amLODipine  10 mg Oral QHS  . aspirin  81 mg Oral  Daily  . calcitRIOL  0.25 mcg Oral Daily  . carvedilol  25 mg Oral BID WC  . clopidogrel  75 mg Oral Daily  . darbepoetin (ARANESP) injection - NON-DIALYSIS  150 mcg Subcutaneous Q Mon-1800  . guaiFENesin  600 mg Oral BID  . heparin  5,000 Units Subcutaneous Q8H  . hydrALAZINE  100 mg Oral Q8H  . insulin aspart  0-5 Units Subcutaneous QHS  . insulin aspart  0-9 Units Subcutaneous TID WC  . isosorbide mononitrate  60 mg Oral Daily  . sertraline  50 mg Oral Daily  . simvastatin  20 mg Oral Daily  . sodium bicarbonate  1,300 mg Oral BID  . sodium chloride flush  3 mL Intravenous Q12H   Continuous Infusions: . sodium chloride 75 mL/hr at 06/03/17 1300   PRN Meds:.acetaminophen **OR** acetaminophen, albuterol, bisacodyl, hydrALAZINE, HYDROcodone-acetaminophen, ondansetron **OR** ondansetron (ZOFRAN) IV, senna-docusate    Anti-infectives (From admission, onward)   None        Objective:   Vitals:   06/03/17 0035 06/03/17 0707 06/03/17 1200 06/03/17 1551  BP: (!) 147/80 (!) 152/76 132/66 (!) 158/76  Pulse: 61 69 61 67  Resp: 16 18 15 20   Temp: 98.2 F (36.8 C) 98.6 F (37 C) 98 F (36.7 C) 98.7 F (37.1 C)  TempSrc: Oral Oral Oral Oral  SpO2: 93% 95% 97% 95%  Weight:      Height:        Wt Readings from Last 3 Encounters:  06/02/17 59.8 kg (131 lb 13.4 oz)  11/20/16 58.9 kg (129 lb 12.8 oz)  06/14/16 59.3 kg (130 lb 12.8 oz)     Intake/Output Summary (Last 24 hours) at 06/03/2017 1909 Last data filed at 06/03/2017 1700 Gross per 24 hour  Intake 2721.25 ml  Output 2025 ml  Net 696.25 ml     Physical Exam  Gen:- Awake Alert,  In no apparent distress  HEENT:- [place].AT, No sclera icterus, lt eye EOM deficit is not new Neck-Supple Neck,No JVD,.  Lungs-  CTAB , good air movement CV- S1, S2 normal Abd-  +ve B.Sounds, Abd Soft, No tenderness,    Extremity/Skin:- No  edema,   good pulses Psych-affect is appropriate , oriented x3 Neuro-patient has some left-sided  deficits from previous stroke, no new acute deficits this time around, but he does have mostly generalized weakness, speech defect appears to have resolved.   Data Review:   Micro Results Recent Results (from the past 240 hour(s))  MRSA PCR Screening     Status: None   Collection Time: 05/28/17  7:01 PM  Result Value Ref Range Status   MRSA by PCR NEGATIVE NEGATIVE Final    Comment:        The GeneXpert MRSA Assay (FDA approved for NASAL specimens only), is one component of a comprehensive MRSA colonization surveillance program. It is not intended to  diagnose MRSA infection nor to guide or monitor treatment for MRSA infections. Performed at [facility], [place] [address] [place], [place] [address]     Radiology Reports Dg Chest 2 View  Result Date: 05/28/2017 CLINICAL DATA:  Hypertension EXAM: CHEST - 2 VIEW COMPARISON:  March 09, 2016 FINDINGS: Lungs are clear. Heart size and pulmonary vascularity are normal. No adenopathy. No bone lesions. IMPRESSION: No edema or consolidation. Electronically Signed   By: Lowella Grip III M.D.   On: 05/28/2017 14:33   Ct Head Wo Contrast  Result Date: 05/28/2017 CLINICAL DATA:  52 y/o  M; MI 1 year ago with deterioration. EXAM: CT HEAD WITHOUT CONTRAST TECHNIQUE: Contiguous axial images were obtained from the base of the skull through the vertex without intravenous contrast. COMPARISON:  03/09/2016 CT head FINDINGS: Brain: No evidence of acute infarction, hemorrhage, hydrocephalus, extra-axial collection or mass lesion/mass effect. Small chronic lacunar infarcts are present within the bilateral lentiform nuclei, right thalamus, and right pons. A focus within the right putamen is new from the prior study. There is a background of chronic microvascular ischemic changes and parenchymal volume loss of the brain that is mildly progressed. Vascular: Calcific atherosclerosis of carotid siphons. No hyperdense vessel identified. Skull: Normal.  Negative for fracture or focal lesion. Sinuses/Orbits: No acute finding. Other: Left intra-ocular lens replacement. IMPRESSION: 1. No acute intracranial abnormality identified. 2. Mild progression of chronic microvascular ischemic changes and parenchymal volume loss of the brain. Several small chronic lacunar infarcts in basal ganglia and brainstem. Electronically Signed   By: Kristine Garbe M.D.   On: 05/28/2017 13:30   US Renal  Result Date: 05/29/2017 CLINICAL DATA:  Renal failure. History of diabetes and hypertension. EXAM: RENAL / URINARY TRACT ULTRASOUND COMPLETE COMPARISON:  None. FINDINGS: Right Kidney: Length: 10.7 cm. Diffusely increased parenchymal echotexture consistent with chronic medical renal disease. No hydronephrosis. Stone in the midpole right kidney measuring 11 mm diameter. Left Kidney: Length: 11.1 cm. Diffusely increased renal parenchymal echotexture consistent with chronic medical renal disease. No hydronephrosis or solid mass identified. Bladder: No bladder wall thickening or filling defects identified. IMPRESSION: 1. Diffusely increased parenchymal echotexture to both kidneys consistent with chronic medical renal disease. 2. Nonobstructing 11 mm stone in the right kidney. 3. No hydronephrosis. Electronically Signed   By: Lucienne Capers M.D.   On: 05/29/2017 00:32     CBC Recent Labs  Lab 05/28/17 1253 05/29/17 0614 06/02/17 0228  WBC 7.4 6.7 5.4  HGB 10.9* 9.6* 9.2*  HCT 31.8* 29.2* 28.9*  PLT 216 202 183  MCV 91.1 91.3 91.7  MCH 31.2 30.0 29.2  MCHC 34.3 32.9 31.8  RDW 12.7 13.2 13.0  LYMPHSABS 1.3  --   --   MONOABS 0.5  --   --   EOSABS 0.1  --   --   BASOSABS 0.0  --   --     Chemistries  Recent Labs  Lab 05/28/17 1253  05/30/17 1049 05/31/17 0241 05/31/17 0954 06/01/17 0218 06/02/17 0228 06/03/17 0456  NA 136   < > 136 136  --  138 136 137  K 4.6   < > 4.5 4.1  --  4.5 4.1 4.2  CL 103   < > 104 105  --  106 104 107  CO2 22   < > 20*  20*  --  22 22 21*  GLUCOSE 148*   < > 161* 168*  --  103* 175* 173*  BUN 63*   < >  54* 55*  --  61* 68* 66*  CREATININE 5.84*   < > 5.08* 5.11*  --  5.15* 5.49* 5.50*  CALCIUM 8.7*   < > 8.4* 8.3* 8.3* 8.6* 8.5* 8.6*  AST 15  --   --   --   --   --   --   --   ALT 12*  --   --   --   --   --   --   --   ALKPHOS 75  --   --   --   --   --   --   --   BILITOT 0.3  --   --   --   --   --   --   --    < > = values in this interval not displayed.   ------------------------------------------------------------------------------------------------------------------ No results for input(s): CHOL, HDL, LDLCALC, TRIG, CHOLHDL, LDLDIRECT in the last 72 hours.  Lab Results  Component Value Date   HGBA1C 6.9 (H) 05/29/2017   ------------------------------------------------------------------------------------------------------------------ No results for input(s): TSH, T4TOTAL, T3FREE, THYROIDAB in the last 72 hours.  Invalid input(s): FREET3 ------------------------------------------------------------------------------------------------------------------ No results for input(s): VITAMINB12, FOLATE, FERRITIN, TIBC, IRON, RETICCTPCT in the last 72 hours.  Coagulation profile No results for input(s): INR, PROTIME in the last 168 hours.  No results for input(s): DDIMER in the last 72 hours.  Cardiac Enzymes Recent Labs  Lab 05/28/17 2326 05/29/17 0614 05/29/17 1045  TROPONINI 0.04* 0.03* 0.03*   ------------------------------------------------------------------------------------------------------------------ No results found for: BNP   Roxan Hockey M.D on 06/03/2017 at 7:09 PM  Between 7am to 7pm - Pager - [phone]  After 7pm go to [email or web address] - password [email or web address]  [facility] -  Office  [phone]   Voice Recognition Viviann Spare dictation system was used to create this note, attempts have been made to correct errors. Please contact the author with questions and/or  clarifications.

## 2017-06-04 ENCOUNTER — Inpatient Hospital Stay (HOSPITAL_COMMUNITY): Payer: BC Managed Care – PPO

## 2017-06-04 DIAGNOSIS — N189 Chronic kidney disease, unspecified: Secondary | ICD-10-CM

## 2017-06-04 LAB — ANCA TITERS
C-ANCA: <1:20 {titer}
P-ANCA: <1:20 {titer}

## 2017-06-04 LAB — GLUCOSE, CAPILLARY
GLUCOSE-CAPILLARY: 157 mg/dL — AB (ref 65–99)
Glucose-Capillary: 179 mg/dL — ABNORMAL HIGH (ref 65–99)
Glucose-Capillary: 183 mg/dL — ABNORMAL HIGH (ref 65–99)
Glucose-Capillary: 153 mg/dL — ABNORMAL HIGH (ref 65–99)
Glucose-Capillary: 209 mg/dL — ABNORMAL HIGH (ref 65–99)

## 2017-06-04 LAB — RENAL FUNCTION PANEL
ANION GAP: 11 (ref 5–15)
Albumin: 2.7 g/dL — ABNORMAL LOW (ref 3.5–5.0)
BUN: 61 mg/dL — ABNORMAL HIGH (ref 6–20)
CO2: 22 mmol/L (ref 22–32)
CREATININE: 5.37 mg/dL — AB (ref 0.61–1.24)
Calcium: 8.9 mg/dL (ref 8.9–10.3)
Chloride: 105 mmol/L (ref 101–111)
GFR calc Af Amer: 13 mL/min — ABNORMAL LOW (ref >60)
GFR, EST NON AFRICAN AMERICAN: 11 mL/min — AB (ref >60)
Glucose, Bld: 234 mg/dL — ABNORMAL HIGH (ref 65–99)
POTASSIUM: 4.2 mmol/L (ref 3.5–5.1)
Phosphorus: 6 mg/dL — ABNORMAL HIGH (ref 2.5–4.6)
Sodium: 138 mmol/L (ref 135–145)

## 2017-06-04 MED ORDER — FUROSEMIDE 80 MG PO TABS
80.0000 mg | ORAL_TABLET | Freq: Every day | ORAL | Status: DC
  Administered 2017-06-04 – 2017-06-09 (×6): 80 mg via ORAL
  Filled 2017-06-04 (×6): qty 1

## 2017-06-04 MED ORDER — NICOTINE 14 MG/24HR TD PT24
14.0000 mg | MEDICATED_PATCH | Freq: Every day | TRANSDERMAL | Status: DC
  Administered 2017-06-04 – 2017-06-09 (×6): 14 mg via TRANSDERMAL
  Filled 2017-06-04 (×6): qty 1

## 2017-06-04 MED ORDER — KIDNEY FAILURE BOOK
Freq: Once | Status: AC
  Administered 2017-06-04: 1
  Filled 2017-06-04: qty 1

## 2017-06-04 MED ORDER — ISOSORBIDE MONONITRATE ER 60 MG PO TB24
120.0000 mg | ORAL_TABLET | Freq: Every day | ORAL | Status: DC
  Administered 2017-06-04 – 2017-06-09 (×6): 120 mg via ORAL
  Filled 2017-06-04 (×6): qty 2

## 2017-06-04 NOTE — Progress Notes (Signed)
Subjective: Interval History: has no complaint..  Objective: Vital signs in last 24 hours: Temp:  [97.7 F (36.5 C)-98.7 F (37.1 C)] 98 F (36.7 C) (04/30 0305) Pulse Rate:  [61-71] 71 (04/30 0305) Resp:  [15-20] 20 (04/29 1928) BP: (132-158)/(66-83) 156/83 (04/30 0411) SpO2:  [95 %-97 %] 96 % (04/30 0305) Weight:  [60.8 kg (134 lb 0.6 oz)] 60.8 kg (134 lb 0.6 oz) (04/30 0500) Weight change:   Intake/Output from previous day: 04/29 0701 - 04/30 0700 In: 2416.3 [P.O.:1020; I.V.:1396.3] Out: 1850 [Urine:1850] Intake/Output this shift: No intake/output data recorded.  General appearance: alert, cooperative and no distress Resp: wheezes bilaterally Cardio: S1, S2 normal and systolic murmur: systolic ejection 2/6, crescendo at 2nd left intercostal space GI: pos bs, liver down 5 cm Extremities: extremities normal, atraumatic, no cyanosis or edema  Lab Results: Recent Labs    06/02/17 0228  WBC 5.4  HGB 9.2*  HCT 28.9*  PLT 183   BMET:  Recent Labs    06/03/17 0456 06/04/17 0232  NA 137 138  K 4.2 4.2  CL 107 105  CO2 21* 22  GLUCOSE 173* 234*  BUN 66* 61*  CREATININE 5.50* 5.37*  CALCIUM 8.6* 8.9   No results for input(s): PTH in the last 72 hours. Iron Studies: No results for input(s): IRON, TIBC, TRANSFERRIN, FERRITIN in the last 72 hours.  Studies/Results: No results found.  I have reviewed the patient's current medications.  Assessment/Plan: 1 AKI/CKD5.  bp control not optimal.  Needs no ivf, needs diuretic. Will give dialysis ed, map 2 HTN add Lasix 3 Anemia esa 4 HPTH pending 5 CVA 6 DM controlled P map, stop ivf, add Lasix.  Can get outpatient access, F/U with Dr. Junie Panning    LOS: 7 days   Jeneen Rinks Idalys Konecny 06/04/2017,7:06 AM

## 2017-06-04 NOTE — Progress Notes (Signed)
Right  Upper Extremity Vein Map       Incidental finding: a complex well-defined non-vascular structure on right shoulder.    Cephalic  Segment Diameter Depth Comment  1. Axilla mm mm Not visualized  2. Mid upper arm mm mm Not visualized  3. Above AC 3.13mm 10.42mm   4. In AC mm mm Not visualized  5. Below AC mm mm Not visualized  6. Mid forearm mm mm Not visualized  7. Wrist mm mm    mm mm    mm mm    mm mm    Basilic  Segment Diameter Depth Comment  1. Axilla 5.59mm 4.32mm   2. Mid upper arm 4.47mm 3.44mm   3. Above AC 5.59mm 7.19mm   4. In [facility] 4.72mm 6.43mm Braching  5. Below AC 3.27mm 2.73mm   6. Mid forearm 3.26mm 1.49mm Thrombosed  7. Wrist mm mm    mm mm    mm mm    mm mm    Left  Upper Extremity Vein Map   Cephalic  Segment Diameter Depth Comment  1. Axilla 2.31mm 3.39mm   2. Mid upper arm 2.41mm 2.86mm   3. Above AC 2.24mm 2.25mm Braching  4. In AC mm mm Thrombosed and Braching  5. Below AC mm mm Not visualized  6. Mid forearm mm mm Not visualized  7. Wrist mm mm Not visualized   mm mm    mm mm    mm mm    Basilic  Segment Diameter Depth Comment  1. Axilla 5.91mm 6.30mm   2. Mid upper arm 4.29mm 10.36mm   3. Above [facility] 4.62mm 10.35mm   4. In [facility] 3.71mm 5.40mm Braching  5. Below AC 2.93mm 5.58mm Braching  6. Mid forearm mm mm Not visualized  7. Wrist mm mm Not visualized   mm mm    mm mm    mm mm     Hongying Valborg Friar (RDMS RVT) 06/04/17 1:17 PM

## 2017-06-04 NOTE — Progress Notes (Signed)
Patient Demographics:    Jeffery Young, is a 52 y.o. male, DOB - 03/28/1965, NLZ:[id number]  Admit date - 05/28/2017   Admitting Physician [name], MD  Outpatient Primary MD for the patient is [name], MD  LOS - 7   Chief Complaint  Patient presents with  . Depression        Subjective:    Jeffery Young today has no fevers, no emesis,  No chest pain,  , eating okay, continues to void well without any dysuria, awaiting transfer to skilled nursing facility when bed becomes available  Assessment  & Plan :    Principal Problem:   AKI (acute kidney injury) on CKD III Active Problems:   Type 2 diabetes mellitus with diabetic nephropathy, without long-term current use of insulin (HCC)   Tobacco abuse   Coronary artery disease due to lipid rich plaque   Elevated troponin   Uncontrolled hypertension   Depression   Renal insufficiency   Normocytic anemia   History of completed stroke  Brief summary 52 year old male with medical history significant for hypertension, type 2 diabetes mellitus, coronary artery disease with stents, and chronic kidney disease admitted on 05/28/2017 with concerns about generalized weakness, depressive symptoms, decreased oral intake and worsening renal function.  Currently awaiting placement to skilled nursing facility, also will need hemodialysis access, vein mapping completed 06/04/2017    Plan: 1) AKI----acute kidney injury on CKD stage V -   patient is voiding well, creatinine still above 5, nephrology consult/input appreciated, suspect worsening renal function due to uncontrolled hypertension compounded by frequent ibuprofen use,  creatinine on admission=5.84 ,   baseline creatinine = 1.9    , creatinine is now= back up > 5     ,  Avoid nephrotoxic agents/dehydration/hypotension, - Potassium wnl, renal ultrasound consistent with medical renal kidney disease without  obstructive uropathy, suspect underlying CKD is due to hypertension and diabetic, give persistent elevation of creatinine check immunofixation and ANCA, ANA are pending, however complement studies are unremarkable,  viral hepatitis panel is unremarkable, glomerular basement antibodies are negative.  Nephrology is advised discontinuation of IV antibiotics and starting diuretics with Lasix,  will need hemodialysis access, vein mapping completed 06/04/2017  2) Hx of CVA with residual deficits - - CT head is negative for acute findings, but notable for old lacunar infarcts in basal ganglia and brainstem, as well as progression of microvascular ischemic disease , patient with left-sided weakness and speech problems  which are not new, - Continue ASA  and statin , (on plavix for CAD), no new/acute stroke this admission, patient has generalized weakness as well as residual left-sided weakness, PT eval appreciated, patient needs skilled nursing facility for rehab  3) Hypertension with hypertensive urgency - - BP is improved , on admission BP 190/111 in ED, asymptomatic , continue Coreg, c/n amlodipine, c/n hydralazine 100 mg 3 times daily and isosorbide 30 mg daily, patient admits to noncompliance with antihypertensive medication for a few days prior to admission, may use IV Hydralazine 10 mg  Every 4 hours Prn for systolic blood pressure over 160 mmhg. no evidence of acute/new stroke at this admission  4) Type II DM - - A1c was 6.9% , continue to hold metformin due to kidney  concerns, Use Novolog/Humalog Sliding scale insulin with Accu-Cheks/Fingersticks as ordered  5. CAD; elevated troponin - - No anginal complaints , suspect due to severely elevated blood pressure with mild ischemia, - Hx of NSTEMI treated with DES to left circumflex ,  Last known EF 55-60%, , continue ASA, Plavix, Coreg, and Zocor  , troponin peaked at 0.04,.    6. Depression -  Denies SI, HI, or hallucinations , depressive symptoms  Manifested in not taking his medications or going to work ,  Social work consulted for available resources , c/n zoloft as ordered  7)Tobacco Abuse-  Smoking cessation advised, Nicotine patch  8) secondary hyperparathyroidism-nephrology has been to manage  DVT prophylaxis: sq heparin  Code Status: Full   Disposition Plan  : ??? Homeless, prior to admission lives with his ex-wife, Phy therapist recommends SNF rehab awaiting transfer to skilled nursing facility when bed is available  Consults  :  SW/nephrology/PT  Lab Results  Component Value Date   PLT 183 06/02/2017   Inpatient Medications  Scheduled Meds: . amLODipine  10 mg Oral QHS  . aspirin  81 mg Oral Daily  . calcitRIOL  0.25 mcg Oral Daily  . carvedilol  25 mg Oral BID WC  . clopidogrel  75 mg Oral Daily  . darbepoetin (ARANESP) injection - NON-DIALYSIS  150 mcg Subcutaneous Q Mon-1800  . furosemide  80 mg Oral Daily  . guaiFENesin  600 mg Oral BID  . heparin  5,000 Units Subcutaneous Q8H  . hydrALAZINE  100 mg Oral Q8H  . insulin aspart  0-5 Units Subcutaneous QHS  . insulin aspart  0-9 Units Subcutaneous TID WC  . isosorbide mononitrate  120 mg Oral Daily  . sertraline  50 mg Oral Daily  . simvastatin  20 mg Oral Daily  . sodium bicarbonate  1,300 mg Oral BID  . sodium chloride flush  3 mL Intravenous Q12H   Continuous Infusions:  PRN Meds:.acetaminophen **OR** acetaminophen, albuterol, bisacodyl, hydrALAZINE, HYDROcodone-acetaminophen, ondansetron **OR** ondansetron (ZOFRAN) IV, senna-docusate    Anti-infectives (From admission, onward)   None        Objective:   Vitals:   06/04/17 1100 06/04/17 1447 06/04/17 1500 06/04/17 1928  BP: 138/74 (!) 162/82 (!) 161/76 139/74  Pulse: (!) 59  68 95  Resp:    18  Temp: 98.7 F (37.1 C)  98.1 F (36.7 C) 98.1 F (36.7 C)  TempSrc: Oral  Oral Oral  SpO2: 98%  95% 96%  Weight:      Height:        Wt Readings from Last 3 Encounters:  06/04/17 60.8 kg  (134 lb 0.6 oz)  11/20/16 58.9 kg (129 lb 12.8 oz)  06/14/16 59.3 kg (130 lb 12.8 oz)     Intake/Output Summary (Last 24 hours) at 06/04/2017 2024 Last data filed at 06/04/2017 1930 Gross per 24 hour  Intake 1316 ml  Output 1550 ml  Net -234 ml     Physical Exam  Gen:- Awake Alert,  In no apparent distress  HEENT:- [place].AT, No sclera icterus, lt eye EOM deficit is not new Neck-Supple Neck,No JVD,.  Lungs-  CTAB , good air movement CV- S1, S2 normal Abd-  +ve B.Sounds, Abd Soft, No tenderness,    Extremity/Skin:- No  edema,   good pulses Psych-affect is appropriate , oriented x3 Neuro-patient has some left-sided deficits from previous stroke, no new acute deficits this time around, but he does have mostly generalized weakness, speech defect appears to  have resolved.   Data Review:   Micro Results Recent Results (from the past 240 hour(s))  MRSA PCR Screening     Status: None   Collection Time: 05/28/17  7:01 PM  Result Value Ref Range Status   MRSA by PCR NEGATIVE NEGATIVE Final    Comment:        The GeneXpert MRSA Assay (FDA approved for NASAL specimens only), is one component of a comprehensive MRSA colonization surveillance program. It is not intended to diagnose MRSA infection nor to guide or monitor treatment for MRSA infections. Performed at [facility], [place] [address] [place], [place] [address]     Radiology Reports Dg Chest 2 View  Result Date: 05/28/2017 CLINICAL DATA:  Hypertension EXAM: CHEST - 2 VIEW COMPARISON:  March 09, 2016 FINDINGS: Lungs are clear. Heart size and pulmonary vascularity are normal. No adenopathy. No bone lesions. IMPRESSION: No edema or consolidation. Electronically Signed   By: Lowella Grip III M.D.   On: 05/28/2017 14:33   Ct Head Wo Contrast  Result Date: 05/28/2017 CLINICAL DATA:  52 y/o  M; MI 1 year ago with deterioration. EXAM: CT HEAD WITHOUT CONTRAST TECHNIQUE: Contiguous axial images were obtained from the  base of the skull through the vertex without intravenous contrast. COMPARISON:  03/09/2016 CT head FINDINGS: Brain: No evidence of acute infarction, hemorrhage, hydrocephalus, extra-axial collection or mass lesion/mass effect. Small chronic lacunar infarcts are present within the bilateral lentiform nuclei, right thalamus, and right pons. A focus within the right putamen is new from the prior study. There is a background of chronic microvascular ischemic changes and parenchymal volume loss of the brain that is mildly progressed. Vascular: Calcific atherosclerosis of carotid siphons. No hyperdense vessel identified. Skull: Normal. Negative for fracture or focal lesion. Sinuses/Orbits: No acute finding. Other: Left intra-ocular lens replacement. IMPRESSION: 1. No acute intracranial abnormality identified. 2. Mild progression of chronic microvascular ischemic changes and parenchymal volume loss of the brain. Several small chronic lacunar infarcts in basal ganglia and brainstem. Electronically Signed   By: Kristine Garbe M.D.   On: 05/28/2017 13:30   US Renal  Result Date: 05/29/2017 CLINICAL DATA:  Renal failure. History of diabetes and hypertension. EXAM: RENAL / URINARY TRACT ULTRASOUND COMPLETE COMPARISON:  None. FINDINGS: Right Kidney: Length: 10.7 cm. Diffusely increased parenchymal echotexture consistent with chronic medical renal disease. No hydronephrosis. Stone in the midpole right kidney measuring 11 mm diameter. Left Kidney: Length: 11.1 cm. Diffusely increased renal parenchymal echotexture consistent with chronic medical renal disease. No hydronephrosis or solid mass identified. Bladder: No bladder wall thickening or filling defects identified. IMPRESSION: 1. Diffusely increased parenchymal echotexture to both kidneys consistent with chronic medical renal disease. 2. Nonobstructing 11 mm stone in the right kidney. 3. No hydronephrosis. Electronically Signed   By: Lucienne Capers M.D.   On:  05/29/2017 00:32     CBC Recent Labs  Lab 05/29/17 0614 06/02/17 0228  WBC 6.7 5.4  HGB 9.6* 9.2*  HCT 29.2* 28.9*  PLT 202 183  MCV 91.3 91.7  MCH 30.0 29.2  MCHC 32.9 31.8  RDW 13.2 13.0    Chemistries  Recent Labs  Lab 05/31/17 0241 05/31/17 0954 06/01/17 0218 06/02/17 0228 06/03/17 0456 06/04/17 0232  NA 136  --  138 136 137 138  K 4.1  --  4.5 4.1 4.2 4.2  CL 105  --  106 104 107 105  CO2 20*  --  22 22 21* 22  GLUCOSE 168*  --  103* 175* 173* 234*  BUN 55*  --  61* 68* 66* 61*  CREATININE 5.11*  --  5.15* 5.49* 5.50* 5.37*  CALCIUM 8.3* 8.3* 8.6* 8.5* 8.6* 8.9   ------------------------------------------------------------------------------------------------------------------ No results for input(s): CHOL, HDL, LDLCALC, TRIG, CHOLHDL, LDLDIRECT in the last 72 hours.  Lab Results  Component Value Date   HGBA1C 6.9 (H) 05/29/2017   ------------------------------------------------------------------------------------------------------------------ No results for input(s): TSH, T4TOTAL, T3FREE, THYROIDAB in the last 72 hours.  Invalid input(s): FREET3 ------------------------------------------------------------------------------------------------------------------ No results for input(s): VITAMINB12, FOLATE, FERRITIN, TIBC, IRON, RETICCTPCT in the last 72 hours.  Coagulation profile No results for input(s): INR, PROTIME in the last 168 hours.  No results for input(s): DDIMER in the last 72 hours.  Cardiac Enzymes Recent Labs  Lab 05/28/17 2326 05/29/17 0614 05/29/17 1045  TROPONINI 0.04* 0.03* 0.03*   ------------------------------------------------------------------------------------------------------------------ No results found for: BNP   Roxan Hockey M.D on 06/04/2017 at 8:24 PM  Between 7am to 7pm - Pager - [phone]  After 7pm go to [email or web address] - password [email or web address]  [facility] -  Office  [phone]   Voice Recognition  Viviann Spare dictation system was used to create this note, attempts have been made to correct errors. Please contact the author with questions and/or clarifications.

## 2017-06-04 NOTE — Progress Notes (Signed)
Clinical Social Worker following patient for discharge need. Patients wife had chosen [facility] for [facility] but facility called CSW back today and stated that they are unable to take patient because they were unaware hey no longer had a contract with [employer].  CSW contacted patient wife to make her aware and she chose [facility] as a back up facility. CSW spoke with admission coordinator Juliann Pulse at [facility] and she will start authorization through patients insurance. CSW to continue to follow patient and family for support and needs.   Rhea Pink, MSW,  [place]

## 2017-06-04 NOTE — Progress Notes (Signed)
Pt's wife says he smokes a lot at home and requests a nicotine patch to try and help him quit.   [place]  Kyley Laurel, RN

## 2017-06-05 DIAGNOSIS — R748 Abnormal levels of other serum enzymes: Secondary | ICD-10-CM

## 2017-06-05 DIAGNOSIS — F329 Major depressive disorder, single episode, unspecified: Secondary | ICD-10-CM

## 2017-06-05 DIAGNOSIS — N289 Disorder of kidney and ureter, unspecified: Secondary | ICD-10-CM

## 2017-06-05 DIAGNOSIS — E1121 Type 2 diabetes mellitus with diabetic nephropathy: Secondary | ICD-10-CM

## 2017-06-05 DIAGNOSIS — N179 Acute kidney failure, unspecified: Principal | ICD-10-CM

## 2017-06-05 DIAGNOSIS — D649 Anemia, unspecified: Secondary | ICD-10-CM

## 2017-06-05 DIAGNOSIS — Z720 Tobacco use: Secondary | ICD-10-CM

## 2017-06-05 DIAGNOSIS — I10 Essential (primary) hypertension: Secondary | ICD-10-CM

## 2017-06-05 DIAGNOSIS — I2583 Coronary atherosclerosis due to lipid rich plaque: Secondary | ICD-10-CM

## 2017-06-05 DIAGNOSIS — I2510 Atherosclerotic heart disease of native coronary artery without angina pectoris: Secondary | ICD-10-CM

## 2017-06-05 LAB — GLUCOSE, CAPILLARY
GLUCOSE-CAPILLARY: 156 mg/dL — AB (ref 65–99)
Glucose-Capillary: 125 mg/dL — ABNORMAL HIGH (ref 65–99)
Glucose-Capillary: 192 mg/dL — ABNORMAL HIGH (ref 65–99)
Glucose-Capillary: 233 mg/dL — ABNORMAL HIGH (ref 65–99)

## 2017-06-05 LAB — RENAL FUNCTION PANEL
Albumin: 2.7 g/dL — ABNORMAL LOW (ref 3.5–5.0)
Anion gap: 9 (ref 5–15)
BUN: 59 mg/dL — AB (ref 6–20)
CHLORIDE: 102 mmol/L (ref 101–111)
CO2: 26 mmol/L (ref 22–32)
CREATININE: 5.32 mg/dL — AB (ref 0.61–1.24)
Calcium: 9 mg/dL (ref 8.9–10.3)
GFR calc Af Amer: 13 mL/min — ABNORMAL LOW (ref >60)
GFR calc non Af Amer: 11 mL/min — ABNORMAL LOW (ref >60)
GLUCOSE: 199 mg/dL — AB (ref 65–99)
POTASSIUM: 4.1 mmol/L (ref 3.5–5.1)
Phosphorus: 6 mg/dL — ABNORMAL HIGH (ref 2.5–4.6)
Sodium: 137 mmol/L (ref 135–145)

## 2017-06-05 NOTE — Progress Notes (Signed)
PROGRESS NOTE  Jeffery Young  ZOX:[id number] DOB: [birth year] DOA: 05/28/2017 PCP: [name], MD   Brief Narrative: Jeffery Young is a 52 y.o. male with a history of HTN, T2DM, CAD w/stenting and stage V CKD who presented 4/23 with generalized weakness, depressive symptoms, and decreased po intake, found to have acute kidney injury on stage V CKD.  Assessment & Plan: Principal Problem:   AKI (acute kidney injury) on CKD III Active Problems:   Type 2 diabetes mellitus with diabetic nephropathy, without long-term current use of insulin (HCC)   Tobacco abuse   Coronary artery disease due to lipid rich plaque   Elevated troponin   Uncontrolled hypertension   Depression   Renal insufficiency   Normocytic anemia   History of completed stroke  AKI on stage V CKD: Complement levels unremarkable. Renal ultrasound consistent with medical renal kidney disease without obstructive uropathy - Nephrology consulted, will follow up with Dr. Carolin Sicks and get access placement as an outpatient. Vein mapping has been performed.  - Follow Cr daily and UOP. Started lasix - Control HTN, DM as much as possible.  - Avoid nephrotoxins, namely ibuprofen which the patient was taking consistently.   History of CVA with residual deficits: CT head is negative for acute findings, but notable for old lacunar infarcts in basal ganglia and brainstem, as well as progression of microvascular ischemic disease, patient with left-sided weakness and speech issues which are chronic.  - Continue ASA, plavix, statin - Will need SNF for rehabilitation per PT recommendations.   Depression: Neurovegetative symptoms are significant and will continue to impact the delivery of medical care.  - Needs close psychiatry follow up as outpatient, preferably within next 1-2 weeks - Started sertraline due to severity of symptoms. May increase 50mg  > 100mg  in 2 weeks if tolerating.  HTN with hypertensive urgency: CP improved (190/11 at  admission).  - Continue coreg, norvasc, BiDil and prn hydralazine  T2DM: HbA1c 6.9%.  - Discontinue home metformin.  - Giving SSI whil inpatient AC/HS  CAD: With mildly elevated troponin (0.04) due to hypertensive urgency and decreased renal clearance. No chest pain.  - Continue ASA, plavix, statin, beta blocker  Tobacco use:  - Brief cessation counseling provided.  - Nicotine patch  DVT prophylaxis: Heparin Code Status: Full Family Communication: None at bedside, wife is up to date on plan. Disposition Plan: Awaiting SNF bed availability. Has insurance authorization x48 hours. The patient was previously living with his wife with whom he is estranged and would not be allowed back in the home. Therefore, the alternative to DC to SNF is to homelessness at this time which is not acceptable.  Consultants:   Nephrology  Procedures:   None  Antimicrobials:  None   Subjective: Withdrawn, but appropriate behavior. Denies any complaints and specifically denies shortness of breath, chest pain, N/V/D, itching.   Objective: Vitals:   06/05/17 0330 06/05/17 0500 06/05/17 0814 06/05/17 1326  BP: (!) 159/73  (!) 167/76 (!) 154/78  Pulse: 77  68 64  Resp: 20  18   Temp: 98.8 F (37.1 C)  98.5 F (36.9 C)   TempSrc: Oral  Oral   SpO2: 93%  93% 92%  Weight:  59.4 kg (131 lb)    Height:        Intake/Output Summary (Last 24 hours) at 06/05/2017 1627 Last data filed at 06/05/2017 1615 Gross per 24 hour  Intake 480 ml  Output 2130 ml  Net -1650 ml   [employer]  06/04/17 0500 06/04/17 2338 06/05/17 0500  Weight: 60.8 kg (134 lb 0.6 oz) 62.2 kg (137 lb 2 oz) 59.4 kg (131 lb)    Gen: 52 y.o. male in no distress Pulm: Non-labored breathing room air. Clear to auscultation bilaterally.  CV: Regular rate and rhythm. No murmur, rub, or gallop. No JVD, trace pedal edema. GI: Abdomen soft, non-tender, non-distended, with normoactive bowel sounds. No organomegaly or masses felt. Ext:  Warm, no deformities. Left PIV.  Skin: No rashes, lesions or ulcers Neuro: Alert and oriented. No focal neurological deficits. Psych: Judgement and insight appear intact. Mood depressed & affect is restricted.   Data Reviewed: I have personally reviewed following labs and imaging studies  CBC: Recent Labs  Lab 06/02/17 0228  WBC 5.4  HGB 9.2*  HCT 28.9*  MCV 91.7  PLT 277   Basic Metabolic Panel: Recent Labs  Lab 06/01/17 0218 06/02/17 0228 06/03/17 0456 06/04/17 0232 06/05/17 0202  NA 138 136 137 138 137  K 4.5 4.1 4.2 4.2 4.1  CL 106 104 107 105 102  CO2 22 22 21* 22 26  GLUCOSE 103* 175* 173* 234* 199*  BUN 61* 68* 66* 61* 59*  CREATININE 5.15* 5.49* 5.50* 5.37* 5.32*  CALCIUM 8.6* 8.5* 8.6* 8.9 9.0  PHOS 6.0* 6.0* 6.1* 6.0* 6.0*   GFR: Estimated Creatinine Clearance: 13.8 mL/min (A) (by C-G formula based on SCr of 5.32 mg/dL (H)). Liver Function Tests: Recent Labs  Lab 06/01/17 0218 06/02/17 0228 06/03/17 0456 06/04/17 0232 06/05/17 0202  ALBUMIN 2.9* 2.6* 2.8* 2.7* 2.7*   No results for input(s): LIPASE, AMYLASE in the last 168 hours. No results for input(s): AMMONIA in the last 168 hours. Coagulation Profile: No results for input(s): INR, PROTIME in the last 168 hours. Cardiac Enzymes: No results for input(s): CKTOTAL, CKMB, CKMBINDEX, TROPONINI in the last 168 hours. BNP (last 3 results) No results for input(s): PROBNP in the last 8760 hours. HbA1C: No results for input(s): HGBA1C in the last 72 hours. CBG: Recent Labs  Lab 06/04/17 1314 06/04/17 1736 06/04/17 2126 06/05/17 0811 06/05/17 1242  GLUCAP 153* 157* 209* 125* 156*   Lipid Profile: No results for input(s): CHOL, HDL, LDLCALC, TRIG, CHOLHDL, LDLDIRECT in the last 72 hours. Thyroid Function Tests: No results for input(s): TSH, T4TOTAL, FREET4, T3FREE, THYROIDAB in the last 72 hours. Anemia Panel: No results for input(s): VITAMINB12, FOLATE, FERRITIN, TIBC, IRON, RETICCTPCT in the  last 72 hours. Urine analysis:    Component Value Date/Time   COLORURINE YELLOW 05/28/2017 [place] 05/28/2017 1253   LABSPEC 1.025 05/28/2017 1253   PHURINE 6.0 05/28/2017 1253   GLUCOSEU 250 (A) 05/28/2017 1253   HGBUR SMALL (A) 05/28/2017 1253   BILIRUBINUR NEGATIVE 05/28/2017 1253   KETONESUR NEGATIVE 05/28/2017 1253   PROTEINUR >300 (A) 05/28/2017 1253   UROBILINOGEN 1.0 08/21/2012 2250   NITRITE NEGATIVE 05/28/2017 1253   LEUKOCYTESUR NEGATIVE 05/28/2017 1253   Recent Results (from the past 240 hour(s))  MRSA PCR Screening     Status: None   Collection Time: 05/28/17  7:01 PM  Result Value Ref Range Status   MRSA by PCR NEGATIVE NEGATIVE Final    Comment:        The GeneXpert MRSA Assay (FDA approved for NASAL specimens only), is one component of a comprehensive MRSA colonization surveillance program. It is not intended to diagnose MRSA infection nor to guide or monitor treatment for MRSA infections. Performed at [facility], [place]  [address], [place], [place] [address]       Radiology Studies: No results found.  Scheduled Meds: . amLODipine  10 mg Oral QHS  . aspirin  81 mg Oral Daily  . calcitRIOL  0.25 mcg Oral Daily  . carvedilol  25 mg Oral BID WC  . clopidogrel  75 mg Oral Daily  . darbepoetin (ARANESP) injection - NON-DIALYSIS  150 mcg Subcutaneous Q Mon-1800  . furosemide  80 mg Oral Daily  . guaiFENesin  600 mg Oral BID  . heparin  5,000 Units Subcutaneous Q8H  . hydrALAZINE  100 mg Oral Q8H  . insulin aspart  0-5 Units Subcutaneous QHS  . insulin aspart  0-9 Units Subcutaneous TID WC  . isosorbide mononitrate  120 mg Oral Daily  . nicotine  14 mg Transdermal Daily  . sertraline  50 mg Oral Daily  . simvastatin  20 mg Oral Daily  . sodium bicarbonate  1,300 mg Oral BID  . sodium chloride flush  3 mL Intravenous Q12H   Continuous Infusions:   LOS: 8 days   Time spent: 15 minutes.  Patrecia Pour, MD [facility] Pager [phone]  If 7PM-7AM, please contact night-coverage [email or web address] Password [facility] 06/05/2017, 4:27 PM

## 2017-06-05 NOTE — Progress Notes (Signed)
3:30PM- CSW following patient for support and rehab placement. Family has chosen to place patient into [facility] and is awaiting authorization. Per facility they are unsure if they will be able to attain authorization based on patients physical therapy note. CSW reached out to patients wife and asked what family's  back up plan would be if patient insurance is denied. Patients wife stated patient will be unable to return to her home since they have been legally separated for 5 years. Wife stated she want him to be placed in a long term facility once she is able to attain disability on behalf of patient. Wife stated she will support patient in any way she can but she is unable to care for him at home. CSW will continue to follow patient for support and discharge needs. CSW will follow up with medically team once insurance is approved or denied    4:00pm Clinical Social Worker following patient for support and discharge need. CSW has been waiting for approval through patients insurance for him to go to rehab. Per facility patient finally received authorization through insurance but unfortunately wont have a bed available until tomorrow or Friday. Authorization through BC/BS will expire after 48/hrs.  Rhea Pink, MSW,  [place]

## 2017-06-05 NOTE — Progress Notes (Signed)
Physical Therapy Treatment Patient Details Name: Jeffery Young MRN: [id number] DOB: 1965-04-23 Today's Date: 06/05/2017    History of Present Illness Pt is a 52 y.o. male admitted 05/28/17 for evaluation of multiple complaints, including depression, gait abnormality, LLE weakness, and gait abnormality over the past several months; pt has refused to seek evaluation up until this point. Head CT negative for acute abnormality; notable for progression of microvascular ischemic changes and several small chronic lacunar infarcts in the basal ganglia and brainstem. Pt also with uncontrolled HTN and worsening renal function; AKI on CKD III. PMH includes CAD, HTN, tobacco abuse, DMII, NSTEMI (12/2015).      PT Comments    Pt con't to be self limiting with depressed mood and decreased motivation to improve. Pt did amb 400' today with RW. Pt con't to have L Sided weakness and onset of back pain with mobility. Acute PT to con't to follow.   Follow Up Recommendations  SNF;Supervision for mobility/OOB     Equipment Recommendations  Rolling walker with 5" wheels    Recommendations for Other Services OT consult     Precautions / Restrictions Precautions Precautions: Fall Precaution Comments: L sided weakness, depressed mood Restrictions Weight Bearing Restrictions: No    Mobility  Bed Mobility Overal bed mobility: Needs Assistance Bed Mobility: Supine to Sit     Supine to sit: Supervision     General bed mobility comments: increased time  Transfers Overall transfer level: Needs assistance Equipment used: None Transfers: Sit to/from Stand Sit to Stand: Min guard         General transfer comment: guarding for balance  Ambulation/Gait Ambulation/Gait assistance: Min assist Ambulation Distance (Feet): 400 Feet Assistive device: 1 person hand held assist;Rolling walker (2 wheeled) Gait Pattern/deviations: Step-to pattern;Step-through pattern;Decreased stride length;Decreased step  length - left;Decreased stance time - left;Decreased weight shift to left;Decreased dorsiflexion - left Gait velocity: decreased Gait velocity interpretation: <1.8 ft/sec, indicate of risk for recurrent falls General Gait Details: attempted to amb with R HHA however pt c/o of low back pain. Pt then transitioned to RW. Pt min guard however required max v/c's to pick up of L foot consistently to clear it and to stay in walker. Pt with report of onset of R hip pain during ambulation. Pt requested to sit however did not have a chair and was able to amb 400' without sitting but 3-4 standing rest break.   Stairs             Wheelchair Mobility    Modified Rankin (Stroke Patients Only) Modified Rankin (Stroke Patients Only) Pre-Morbid Rankin Score: No significant disability Modified Rankin: Moderately severe disability     Balance Overall balance assessment: Needs assistance Sitting-balance support: No upper extremity supported;Feet supported Sitting balance-Leahy Scale: Good Sitting balance - Comments: Indep to don socks EOB; increased time and effort     Standing balance-Leahy Scale: Poor Standing balance comment: when standing without UE assist pt leaning on bed with back of legs                            Cognition Arousal/Alertness: Awake/alert Behavior During Therapy: Flat affect Overall Cognitive Status: Impaired/Different from baseline                           Safety/Judgement: Decreased awareness of safety;Decreased awareness of deficits   Problem Solving: Decreased initiation;Requires verbal cues;Requires tactile cues General Comments: pt with  depressed mood and requires max encouragement to participate in PT and to do things for himself      Exercises      General Comments General comments (skin integrity, edema, etc.): spoke with pt and spouse about doing more for himself and getting OOB with staff 3x/day. Pt with depressed mood and self  limiting attitude requiring max encouragement to pariticipate      Pertinent Vitals/Pain Pain Assessment: 0-10 Pain Score: 8  Pain Location: lower back and R hip Pain Descriptors / Indicators: Constant Pain Intervention(s): Monitored during session    Home Living                      Prior Function            PT Goals (current goals can now be found in the care plan section) Progress towards PT goals: Progressing toward goals    Frequency    Min 3X/week      PT Plan Current plan remains appropriate    Co-evaluation              AM-PAC PT "6 Clicks" Daily Activity  Outcome Measure  Difficulty turning over in bed (including adjusting bedclothes, sheets and blankets)?: A Little Difficulty moving from lying on back to sitting on the side of the bed? : A Little Difficulty sitting down on and standing up from a chair with arms (e.g., wheelchair, bedside commode, etc,.)?: A Little Help needed moving to and from a bed to chair (including a wheelchair)?: A Little Help needed walking in hospital room?: A Little Help needed climbing 3-5 steps with a railing? : A Little 6 Click Score: 18    End of Session Equipment Utilized During Treatment: Gait belt Activity Tolerance: Patient tolerated treatment well Patient left: in chair;with call bell/phone within reach;with chair alarm set Nurse Communication: Mobility status PT Visit Diagnosis: Other abnormalities of gait and mobility (R26.89);Other symptoms and signs involving the nervous system (R29.898)     Time: 1200-1229 PT Time Calculation (min) (ACUTE ONLY): 29 min  Charges:  $Gait Training: 23-37 mins                    G Codes:       Kittie Plater, PT, DPT Pager #: [phone] Office #: [phone]    [place] 06/05/2017, 1:52 PM

## 2017-06-05 NOTE — Progress Notes (Signed)
Subjective: Interval History: has no complaint, appetite ok. No N, V itching.  Objective: Vital signs in last 24 hours: Temp:  [98.1 F (36.7 C)-98.8 F (37.1 C)] 98.8 F (37.1 C) (05/01 0330) Pulse Rate:  [59-97] 77 (05/01 0330) Resp:  [18-20] 20 (05/01 0330) BP: (138-162)/(70-86) 159/73 (05/01 0330) SpO2:  [93 %-98 %] 93 % (05/01 0330) Weight:  [59.4 kg (131 lb)-62.2 kg (137 lb 2 oz)] 59.4 kg (131 lb) (05/01 0500) Weight change: 1.4 kg (3 lb 1.4 oz)  Intake/Output from previous day: 04/30 0701 - 05/01 0700 In: 726 [P.O.:720; I.V.:6] Out: 1650 [Urine:1650] Intake/Output this shift: No intake/output data recorded.  General appearance: alert, cooperative and no distress Resp: wheezes bibasilar Cardio: S1, S2 normal and systolic murmur: systolic ejection 2/6, decrescendo at 2nd left intercostal space GI: pos bs, liver down 4 cm soft Extremities: extremities normal, atraumatic, no cyanosis or edema  Lab Results: No results for input(s): WBC, HGB, HCT, PLT in the last 72 hours. BMET:  Recent Labs    06/04/17 0232 06/05/17 0202  NA 138 137  K 4.2 4.1  CL 105 102  CO2 22 26  GLUCOSE 234* 199*  BUN 61* 59*  CREATININE 5.37* 5.32*  CALCIUM 8.9 9.0   No results for input(s): PTH in the last 72 hours. Iron Studies: No results for input(s): IRON, TIBC, TRANSFERRIN, FERRITIN in the last 72 hours.  Studies/Results: No results found.  I have reviewed the patient's current medications.  Assessment/Plan: 1 CKD 5 . Still a remote chance of recovery but limited.  Suspect he will not.  Can get access as outpatient 2 HTN improving Lasix added 3 Smoking 4 Anemia esa, Fe ok 5 HPTH vit D 6 Dm controlled 7 Dispo P will not follow formally at this time , needs to see Dr. Junie Panning as outpatient within 3 wk    LOS: 8 days   Jeneen Rinks Joletta Manner 06/05/2017,7:15 AM

## 2017-06-05 NOTE — Progress Notes (Signed)
Pt request 6am meds to be given during breakfast on day shift. This RN will pass on request to day shift RN

## 2017-06-06 LAB — GLUCOSE, CAPILLARY
GLUCOSE-CAPILLARY: 180 mg/dL — AB (ref 65–99)
GLUCOSE-CAPILLARY: 200 mg/dL — AB (ref 65–99)
Glucose-Capillary: 171 mg/dL — ABNORMAL HIGH (ref 65–99)
Glucose-Capillary: 205 mg/dL — ABNORMAL HIGH (ref 65–99)

## 2017-06-06 LAB — IMMUNOFIXATION ELECTROPHORESIS
IGA: 184 mg/dL (ref 90–386)
IGG (IMMUNOGLOBIN G), SERUM: 1029 mg/dL (ref 700–1600)
IgM (Immunoglobulin M), Srm: 48 mg/dL (ref 20–172)
Total Protein ELP: 5.3 g/dL — ABNORMAL LOW (ref 6.0–8.5)

## 2017-06-06 LAB — RENAL FUNCTION PANEL
Albumin: 2.7 g/dL — ABNORMAL LOW (ref 3.5–5.0)
Anion gap: 9 (ref 5–15)
BUN: 58 mg/dL — AB (ref 6–20)
CHLORIDE: 100 mmol/L — AB (ref 101–111)
CO2: 26 mmol/L (ref 22–32)
Calcium: 8.8 mg/dL — ABNORMAL LOW (ref 8.9–10.3)
Creatinine, Ser: 5.7 mg/dL — ABNORMAL HIGH (ref 0.61–1.24)
GFR calc Af Amer: 12 mL/min — ABNORMAL LOW (ref >60)
GFR calc non Af Amer: 10 mL/min — ABNORMAL LOW (ref >60)
GLUCOSE: 215 mg/dL — AB (ref 65–99)
POTASSIUM: 3.9 mmol/L (ref 3.5–5.1)
Phosphorus: 5.5 mg/dL — ABNORMAL HIGH (ref 2.5–4.6)
Sodium: 135 mmol/L (ref 135–145)

## 2017-06-06 NOTE — Discharge Summary (Signed)
Physician Discharge Summary  Jeffery Young XYV:[id number] DOB: 1965-11-18 DOA: 05/28/2017  PCP: [name], MD  Admit date: 05/28/2017 Discharge date: 06/07/2017  Admitted From: Home Disposition: SNF   Recommendations for Outpatient Follow-up:  1. Follow up with nephrology, Dr. Carolin Sicks for follow up of renal disease. Anticipate he will need access for HD going forward. Vein mapping performed prior to discharge.  2. Follow up with psychiatry in 1-2 weeks. Started SSRI, consider increasing dose if tolerating.   Home Health: N/A Equipment/Devices: Per SNF Discharge Condition: Stable CODE STATUS: Full Diet recommendation: Renal, carb-modified  Brief/Interim Summary: Jeffery Young is a 52 y.o. male with a history of HTN, T2DM, CAD w/stenting and stage V CKD who presented 4/23 with generalized weakness, depressive symptoms, and decreased po intake, found to have acute kidney injury on stage V CKD. Nephrology was consulted, though the patient did not need hemodialysis.   Discharge Diagnoses:  Principal Problem:   AKI (acute kidney injury) on CKD III Active Problems:   Type 2 diabetes mellitus with diabetic nephropathy, without long-term current use of insulin (HCC)   Tobacco abuse   Coronary artery disease due to lipid rich plaque   Elevated troponin   Uncontrolled hypertension   Depression   Renal insufficiency   Normocytic anemia   History of completed stroke  AKI on stage III CKD, now stage V CKD: Complement levels unremarkable. Renal ultrasound consistent with medical renal kidney disease without obstructive uropathy - Nephrology consulted, will follow up with Dr. Carolin Sicks and get access placement as an outpatient. Vein mapping has been performed.  - Making urine, continue lasix 80mg  po daily - Continue calcitriol, bicarbonate - Control HTN, DM as much as possible.  - Avoid nephrotoxins, namely ibuprofen which the patient was taking consistently.   History of CVA with residual  deficits: CT head is negative for acute findings, but notable for old lacunar infarcts in basal ganglia and brainstem, as well as progression of microvascular ischemic disease, patient with left-sided weakness and speech issues which are chronic.  - Continue ASA, plavix, statin - Will need SNF for rehabilitation per PT recommendations.   Depression: Neurovegetative symptoms are significant and will continue to impact the delivery of medical care.  - Needs close psychiatry follow up as outpatient, preferably within next 1-2 weeks - Started sertraline due to severity of symptoms. May increase 50mg  > 100mg  in 2 weeks if tolerating.  HTN with hypertensive urgency: BP improved (190/11 at admission) with additional medications.  - Continue coreg, norvasc, BiDil and hydralazine  T2DM: HbA1c 6.9%.  - Discontinue home metformin. Could consider tradjenta 5mg  daily as replacement.   CAD: With mildly elevated troponin (0.04) due to hypertensive urgency and decreased renal clearance. No chest pain.  - Continue ASA, plavix, statin, beta blocker  Tobacco use:  - Brief cessation counseling provided.  - Nicotine patch  Discharge Instructions Discharge Instructions    Diet - low sodium heart healthy   Complete by:  As directed    Increase activity slowly   Complete by:  As directed      Allergies as of 06/07/2017   No Known Allergies     Medication List    STOP taking these medications   ibuprofen 800 MG tablet Commonly known as:  ADVIL,MOTRIN   metFORMIN 1000 MG tablet Commonly known as:  GLUCOPHAGE   traMADol 50 MG tablet Commonly known as:  ULTRAM     TAKE these medications   amLODipine 10 MG tablet Commonly known as:  NORVASC Take 1 tablet (10 mg total) by mouth at bedtime.   aspirin 81 MG chewable tablet Chew 1 tablet (81 mg total) by mouth daily.   calcitRIOL 0.25 MCG capsule Commonly known as:  ROCALTROL Take 1 capsule (0.25 mcg total) by mouth daily. Start taking  on:  06/08/2017   carvedilol 25 MG tablet Commonly known as:  COREG Take 1 tablet (25 mg total) by mouth 2 (two) times daily with a meal.   clopidogrel 75 MG tablet Commonly known as:  PLAVIX TAKE ONE TABLET BY MOUTH DAILY   furosemide 80 MG tablet Commonly known as:  LASIX Take 1 tablet (80 mg total) by mouth daily. Start taking on:  06/08/2017   hydrALAZINE 100 MG tablet Commonly known as:  APRESOLINE Take 1 tablet (100 mg total) by mouth 3 (three) times daily. What changed:    medication strength  how much to take   isosorbide mononitrate 120 MG 24 hr tablet Commonly known as:  IMDUR Take 1 tablet (120 mg total) by mouth daily. Start taking on:  06/08/2017   MULTIVITAMIN ADULT PO Take 1 tablet by mouth daily.   nitroGLYCERIN 0.4 MG SL tablet Commonly known as:  NITROSTAT Place 1 tablet (0.4 mg total) under the tongue every 5 (five) minutes x 3 doses as needed for chest pain.   sertraline 50 MG tablet Commonly known as:  ZOLOFT Take 1 tablet (50 mg total) by mouth daily. Start taking on:  06/08/2017   simvastatin 20 MG tablet Commonly known as:  ZOCOR Take 20 mg by mouth daily.   sodium bicarbonate 650 MG tablet Take 2 tablets (1,300 mg total) by mouth 2 (two) times daily.      Follow-up Information    [name], MD Follow up.   Specialty:  Family Medicine Contact information: [place] [address] [phone]        Rosita Fire, MD. Schedule an appointment as soon as possible for a visit in 2 week(s).   Specialties:  Nephrology, Internal Medicine Contact information: [place] [address] [phone]          No Known Allergies  Consultations:  Nephrology, Dr. Jimmy Footman  Procedures/Studies: Dg Chest 2 View  Result Date: 05/28/2017 CLINICAL DATA:  Hypertension EXAM: CHEST - 2 VIEW COMPARISON:  March 09, 2016 FINDINGS: Lungs are clear. Heart size and pulmonary vascularity are normal. No adenopathy. No  bone lesions. IMPRESSION: No edema or consolidation. Electronically Signed   By: Lowella Grip III M.D.   On: 05/28/2017 14:33   Ct Head Wo Contrast  Result Date: 05/28/2017 CLINICAL DATA:  52 y/o  M; MI 1 year ago with deterioration. EXAM: CT HEAD WITHOUT CONTRAST TECHNIQUE: Contiguous axial images were obtained from the base of the skull through the vertex without intravenous contrast. COMPARISON:  03/09/2016 CT head FINDINGS: Brain: No evidence of acute infarction, hemorrhage, hydrocephalus, extra-axial collection or mass lesion/mass effect. Small chronic lacunar infarcts are present within the bilateral lentiform nuclei, right thalamus, and right pons. A focus within the right putamen is new from the prior study. There is a background of chronic microvascular ischemic changes and parenchymal volume loss of the brain that is mildly progressed. Vascular: Calcific atherosclerosis of carotid siphons. No hyperdense vessel identified. Skull: Normal. Negative for fracture or focal lesion. Sinuses/Orbits: No acute finding. Other: Left intra-ocular lens replacement. IMPRESSION: 1. No acute intracranial abnormality identified. 2. Mild progression of chronic microvascular ischemic changes and parenchymal volume loss of the  brain. Several small chronic lacunar infarcts in basal ganglia and brainstem. Electronically Signed   By: Kristine Garbe M.D.   On: 05/28/2017 13:30   US Renal  Result Date: 05/29/2017 CLINICAL DATA:  Renal failure. History of diabetes and hypertension. EXAM: RENAL / URINARY TRACT ULTRASOUND COMPLETE COMPARISON:  None. FINDINGS: Right Kidney: Length: 10.7 cm. Diffusely increased parenchymal echotexture consistent with chronic medical renal disease. No hydronephrosis. Stone in the midpole right kidney measuring 11 mm diameter. Left Kidney: Length: 11.1 cm. Diffusely increased renal parenchymal echotexture consistent with chronic medical renal disease. No hydronephrosis or solid mass  identified. Bladder: No bladder wall thickening or filling defects identified. IMPRESSION: 1. Diffusely increased parenchymal echotexture to both kidneys consistent with chronic medical renal disease. 2. Nonobstructing 11 mm stone in the right kidney. 3. No hydronephrosis. Electronically Signed   By: Lucienne Capers M.D.   On: 05/29/2017 00:32   Subjective: Did not sleep well last night because of noise on the unit. Ate breakfast and has not other complaints. No dyspnea, chest pain, palpitations, N/V/D, or itching or hiccups.  Discharge Exam: Vitals:   06/07/17 0539 06/07/17 0800  BP: (!) 156/71 (!) 148/72  Pulse:  (!) 144  Resp:    Temp:  98.9 F (37.2 C)  SpO2:  92%   General: 52yo male in no distress Cardiovascular: RRR, S1/S2 +, no rubs, no gallops Respiratory: CTA bilaterally, no wheezing, no rhonchi Abdominal: Soft, NT, ND, bowel sounds + Extremities: No edema, no cyanosis Psych: Flat affect  Labs: Basic Metabolic Panel: Recent Labs  Lab 06/03/17 0456 06/04/17 0232 06/05/17 0202 06/06/17 0229 06/07/17 0324  NA 137 138 137 135 133*  K 4.2 4.2 4.1 3.9 3.7  CL 107 105 102 100* 96*  CO2 21* 22 26 26 27   GLUCOSE 173* 234* 199* 215* 217*  BUN 66* 61* 59* 58* 61*  CREATININE 5.50* 5.37* 5.32* 5.70* 5.86*  CALCIUM 8.6* 8.9 9.0 8.8* 8.7*  PHOS 6.1* 6.0* 6.0* 5.5* 5.3*   Liver Function Tests: Recent Labs  Lab 06/03/17 0456 06/04/17 0232 06/05/17 0202 06/06/17 0229 06/07/17 0324  ALBUMIN 2.8* 2.7* 2.7* 2.7* 2.9*   CBC: Recent Labs  Lab 06/02/17 0228  WBC 5.4  HGB 9.2*  HCT 28.9*  MCV 91.7  PLT 183   CBG: Recent Labs  Lab 06/06/17 0813 06/06/17 1225 06/06/17 1649 06/06/17 2101 06/07/17 0802  GLUCAP 171* 180* 200* 205* 233*   Urinalysis    Component Value Date/Time   COLORURINE YELLOW 05/28/2017 1253   APPEARANCEUR CLEAR 05/28/2017 1253   LABSPEC 1.025 05/28/2017 1253   PHURINE 6.0 05/28/2017 1253   GLUCOSEU 250 (A) 05/28/2017 1253   HGBUR  SMALL (A) 05/28/2017 1253   BILIRUBINUR NEGATIVE 05/28/2017 1253   KETONESUR NEGATIVE 05/28/2017 1253   PROTEINUR >300 (A) 05/28/2017 1253   UROBILINOGEN 1.0 08/21/2012 2250   NITRITE NEGATIVE 05/28/2017 1253   LEUKOCYTESUR NEGATIVE 05/28/2017 1253    Microbiology Recent Results (from the past 240 hour(s))  MRSA PCR Screening     Status: None   Collection Time: 05/28/17  7:01 PM  Result Value Ref Range Status   MRSA by PCR NEGATIVE NEGATIVE Final    Comment:        The GeneXpert MRSA Assay (FDA approved for NASAL specimens only), is one component of a comprehensive MRSA colonization surveillance program. It is not intended to diagnose MRSA infection nor to guide or monitor treatment for MRSA infections. Performed at [facility], [place]  [address] [place], [place] [address]     Time coordinating discharge: Approximately 40 minutes  Patrecia Pour, MD  [facility] 06/07/2017, 9:34 AM Pager [phone]

## 2017-06-06 NOTE — Progress Notes (Signed)
Physical Therapy Treatment Patient Details Name: Jeffery Young MRN: [id number] DOB: 10/07/1965 Today's Date: 06/06/2017    History of Present Illness Pt is a 52 y.o. male admitted 05/28/17 for evaluation of multiple complaints, including depression, gait abnormality, LLE weakness, and gait abnormality over the past several months; pt has refused to seek evaluation up until this point. Head CT negative for acute abnormality; notable for progression of microvascular ischemic changes and several small chronic lacunar infarcts in the basal ganglia and brainstem. Pt also with uncontrolled HTN and worsening renal function; AKI on CKD III. PMH includes CAD, HTN, tobacco abuse, DMII, NSTEMI (12/2015).     PT Comments    Pt declining OOB mobility today secondary to fatigue. Agreeable to LE therex. Provided HEP handout with exercises and frequency. Pt remains flat with mood; not responsive to max encouragement this session. Continue to recommend SNF-level therapies to maximize functional mobility and independence.   Follow Up Recommendations  SNF;Supervision for mobility/OOB     Equipment Recommendations  Rolling walker with 5" wheels    Recommendations for Other Services       Precautions / Restrictions Precautions Precautions: Fall Precaution Comments: L sided weakness, depressed mood Restrictions Weight Bearing Restrictions: No    Mobility  Bed Mobility               General bed mobility comments: Declining OOB mobility despite max encouragement and education. Agreeable to LE therex  Transfers                    Ambulation/Gait                 Stairs             Wheelchair Mobility    Modified Rankin (Stroke Patients Only)       Balance                                            Cognition Arousal/Alertness: Awake/alert Behavior During Therapy: Flat affect Overall Cognitive Status: Impaired/Different from baseline                                        Exercises Other Exercises Other Exercises: HEP handout provided: SLR, supine hip abd/add, seated toe/heel raises, seated marching    General Comments        Pertinent Vitals/Pain Pain Assessment: No/denies pain    Home Living                      Prior Function            PT Goals (current goals can now be found in the care plan section) Acute Rehab PT Goals PT Goal Formulation: With patient Time For Goal Achievement: 06/12/17 Potential to Achieve Goals: Fair Progress towards PT goals: Progressing toward goals    Frequency    Min 3X/week      PT Plan Current plan remains appropriate    Co-evaluation              AM-PAC PT "6 Clicks" Daily Activity  Outcome Measure  Difficulty turning over in bed (including adjusting bedclothes, sheets and blankets)?: A Little Difficulty moving from lying on back to sitting on the side of the bed? : A Little  Difficulty sitting down on and standing up from a chair with arms (e.g., wheelchair, bedside commode, etc,.)?: A Little Help needed moving to and from a bed to chair (including a wheelchair)?: A Little Help needed walking in hospital room?: A Little Help needed climbing 3-5 steps with a railing? : A Little 6 Click Score: 18    End of Session   Activity Tolerance: Patient limited by fatigue Patient left: in bed;with call bell/phone within reach Nurse Communication: Mobility status       Time: 4536-4680 PT Time Calculation (min) (ACUTE ONLY): 13 min  Charges:  $Therapeutic Exercise: 8-22 mins                    G Codes:      Mabeline Caras, PT, DPT Acute Rehab Services  Pager: [place] 06/06/2017, 4:46 PM

## 2017-06-06 NOTE — Progress Notes (Signed)
Inpatient Diabetes Program Recommendations  AACE/ADA: New Consensus Statement on Inpatient Glycemic Control (2015)  Target Ranges:  Prepandial:   less than 140 mg/dL      Peak postprandial:   less than 180 mg/dL (1-2 hours)      Critically ill patients:  140 - 180 mg/dL   Results for Jeffery Young, Jeffery Young (MRN [id number]) as of 06/06/2017 09:17  Ref. Range 06/05/2017 08:11 06/05/2017 12:42 06/05/2017 16:42 06/05/2017 21:56 06/06/2017 08:13  Glucose-Capillary Latest Ref Range: 65 - 99 mg/dL 125 (H) 156 (H) 192 (H) 233 (H) 171 (H)   Review of Glycemic Control  Diabetes history: DM2 Outpatient Diabetes medications: Metformin 1000 mg BID Current orders for Inpatient glycemic control: Novolog 0-9 units TID with meals  Inpatient Diabetes Program Recommendations: Oral Agents: Noted Metformin will be discontinued due to acute kidney injury. MD may want to consider ordering Tradjenta 5 mg daily (DPP-4 inhibitor that is eliminated via feces and has low risk of hypoglycemia).  Thanks, Barnie Alderman, RN, MSN, CDE Diabetes Coordinator Inpatient Diabetes Program [phone] (Team Pager from 8am to 5pm)

## 2017-06-06 NOTE — Progress Notes (Signed)
  PROGRESS NOTE  Jeffery Young  Jeffery Young DOB: Sep 12, 1965 DOA: 05/28/2017 PCP: [name], MD   Brief Narrative: Jeffery Young is a 52 y.o. male with a history of HTN, T2DM, CAD w/stenting and stage V CKD who presented 4/23 with generalized weakness, depressive symptoms, and decreased po intake, found to have acute kidney injury on stage V CKD.  Assessment & Plan: Principal Problem:   AKI (acute kidney injury) on CKD III Active Problems:   Type 2 diabetes mellitus with diabetic nephropathy, without long-term current use of insulin (HCC)   Tobacco abuse   Coronary artery disease due to lipid rich plaque   Elevated troponin   Uncontrolled hypertension   Depression   Renal insufficiency   Normocytic anemia   History of completed stroke  AKI on stage V CKD: Complement levels unremarkable. Renal ultrasound consistent with medical renal kidney disease without obstructive uropathy - Nephrology consulted, will follow up with Dr. Carolin Sicks and get access placement as an outpatient. Vein mapping has been performed.  - Making urine, continue lasix 80mg  po daily - Control HTN, DM as much as possible.  - Avoid nephrotoxins, namely ibuprofen which the patient was taking consistently.   History of CVA with residual deficits: CT head is negative for acute findings, but notable for old lacunar infarcts in basal ganglia and brainstem, as well as progression of microvascular ischemic disease, patient with left-sided weakness and speech issues which are chronic.  - Continue ASA, plavix, statin - Will need SNF for rehabilitation per PT recommendations.   Depression: Neurovegetative symptoms are significant and will continue to impact the delivery of medical care.  - Needs close psychiatry follow up as outpatient, preferably within next 1-2 weeks - Started sertraline due to severity of symptoms. May increase 50mg  > 100mg  in 2 weeks if tolerating.  HTN with hypertensive urgency: CP improved (190/11  at admission).  - Continue coreg, norvasc, BiDil and prn hydralazine  T2DM: HbA1c 6.9%.  - Discontinue home metformin. Could consider tradjenta 5mg  daily as replacement.   CAD: With mildly elevated troponin (0.04) due to hypertensive urgency and decreased renal clearance. No chest pain.  - Continue ASA, plavix, statin, beta blocker  Tobacco use:  - Brief cessation counseling provided.  - Nicotine patch  DVT prophylaxis: Heparin Code Status: Full Family Communication: None at bedside, wife is up to date on plan. Disposition Plan: SNF 5/3.  Subjective: No complaints, making urine. Again denies shortness of breath, chest pain, N/V/D, itching.   Objective: BP (!) 150/74 (BP Location: Right Arm)   Pulse 66   Temp 98.2 F (36.8 C) (Oral)   Resp 20   Ht 5\' 5"  (1.651 m)   Wt 58.8 kg (129 lb 11.2 oz)   SpO2 95%   BMI 21.58 kg/m   Gen: 52 y.o. male in no distress Pulm: Non-labored breathing room air. Clear to auscultation bilaterally.  Neuro: Alert and oriented. No focal neurological deficits. Psych: Judgement and insight appear intact. Mood depressed & affect is restricted.   Time spent: 15 minutes.  Patrecia Pour, MD [facility] Pager [phone]

## 2017-06-06 NOTE — Progress Notes (Signed)
Clinical Social Worker following patient for placement needs. Admission Coordinator from [facility] stated they will be able to have a bed for patient tomorrow (Friday May 3rd).    Rhea Pink, MSW,  [place]

## 2017-06-07 DIAGNOSIS — Z8673 Personal history of transient ischemic attack (TIA), and cerebral infarction without residual deficits: Secondary | ICD-10-CM

## 2017-06-07 LAB — GLUCOSE, CAPILLARY
GLUCOSE-CAPILLARY: 233 mg/dL — AB (ref 65–99)
GLUCOSE-CAPILLARY: 218 mg/dL — AB (ref 65–99)
Glucose-Capillary: 195 mg/dL — ABNORMAL HIGH (ref 65–99)
Glucose-Capillary: 208 mg/dL — ABNORMAL HIGH (ref 65–99)

## 2017-06-07 LAB — RENAL FUNCTION PANEL
ALBUMIN: 2.9 g/dL — AB (ref 3.5–5.0)
Anion gap: 10 (ref 5–15)
BUN: 61 mg/dL — AB (ref 6–20)
CALCIUM: 8.7 mg/dL — AB (ref 8.9–10.3)
CO2: 27 mmol/L (ref 22–32)
CREATININE: 5.86 mg/dL — AB (ref 0.61–1.24)
Chloride: 96 mmol/L — ABNORMAL LOW (ref 101–111)
GFR calc Af Amer: 12 mL/min — ABNORMAL LOW (ref >60)
GFR, EST NON AFRICAN AMERICAN: 10 mL/min — AB (ref >60)
Glucose, Bld: 217 mg/dL — ABNORMAL HIGH (ref 65–99)
PHOSPHORUS: 5.3 mg/dL — AB (ref 2.5–4.6)
Potassium: 3.7 mmol/L (ref 3.5–5.1)
SODIUM: 133 mmol/L — AB (ref 135–145)

## 2017-06-07 MED ORDER — CALCITRIOL 0.25 MCG PO CAPS: 0.2500 ug | ORAL_CAPSULE | Freq: Every day | ORAL | Status: DC

## 2017-06-07 MED ORDER — AMLODIPINE BESYLATE 10 MG PO TABS: 10.0000 mg | ORAL_TABLET | Freq: Every day | ORAL | Status: DC

## 2017-06-07 MED ORDER — FUROSEMIDE 80 MG PO TABS: 80.0000 mg | ORAL_TABLET | Freq: Every day | ORAL | Status: DC

## 2017-06-07 MED ORDER — SERTRALINE HCL 50 MG PO TABS: 50.0000 mg | ORAL_TABLET | Freq: Every day | ORAL | Status: DC

## 2017-06-07 MED ORDER — SODIUM BICARBONATE 650 MG PO TABS: 1300.0000 mg | ORAL_TABLET | Freq: Two times a day (BID) | ORAL | Status: DC

## 2017-06-07 MED ORDER — HYDRALAZINE HCL 100 MG PO TABS: 100.0000 mg | ORAL_TABLET | Freq: Three times a day (TID) | ORAL | Status: DC

## 2017-06-07 MED ORDER — ISOSORBIDE MONONITRATE ER 120 MG PO TB24: 120.0000 mg | ORAL_TABLET | Freq: Every day | ORAL | Status: DC

## 2017-06-07 NOTE — NC FL2 (Signed)
[place] LEVEL OF CARE SCREENING TOOL     IDENTIFICATION  Patient Name: Jeffery Young Birthdate: February 16, 1965 Sex: male Admission Date (Current Location): 05/28/2017  [facility] and [place] Number:  Herbalist and Address:  [facility], [place] [address] [place], [place] [address]      Provider Number: [id number]  Attending Physician Name and Address:  Patrecia Pour, MD  Relative Name and Phone Number:       Current Level of Care: Hospital Recommended Level of Care: [place] Prior Approval Number:    Date Approved/Denied:   PASRR Number: pending  Discharge Plan: SNF    Current Diagnoses: Patient Active Problem List   Diagnosis Date Noted  . AKI (acute kidney injury) on CKD III 05/29/2017  . Uncontrolled hypertension 05/28/2017  . Depression 05/28/2017  . Renal insufficiency 05/28/2017  . Normocytic anemia 05/28/2017  . History of completed stroke 05/28/2017  . Syncope 03/13/2016  . Ischemic cardiomyopathy 03/13/2016  . Unstable angina pectoris ([place]) 01/11/2016  . Hypertensive heart disease with heart failure ([place])   . Chronic combined systolic and diastolic heart failure ([place])   . Elevated troponin   . Chest pain 01/10/2016  . Coronary artery disease due to lipid rich plaque 01/10/2016  . Status post coronary artery stent placement   . CKD (chronic kidney disease), stage III ([place])   . Type 2 diabetes mellitus with diabetic nephropathy, without long-term current use of insulin ([place])   . Tobacco abuse   . Essential hypertension   . Hypercholesteremia   . NSTEMI (non-ST elevated myocardial infarction) ([place]) 12/22/2015  . Lightheadedness 08/22/2012  . Dehydration 08/22/2012    Orientation RESPIRATION BLADDER Height & Weight     Self, Time, Situation, Place  Normal Continent Weight: 125 lb 14.1 oz (57.1 kg) Height:  5\' 5"  (165.1 cm)  BEHAVIORAL SYMPTOMS/MOOD NEUROLOGICAL BOWEL NUTRITION STATUS   Continent Diet(heart healthy)  AMBULATORY STATUS COMMUNICATION OF NEEDS Skin   Limited Assist Verbally                         Personal Care Assistance Level of Assistance  Bathing, Feeding, Dressing Bathing Assistance: Limited assistance Feeding assistance: Independent Dressing Assistance: Limited assistance     Functional Limitations Info  Sight, Hearing, Speech Sight Info: Adequate Hearing Info: Adequate Speech Info: Adequate    SPECIAL CARE FACTORS FREQUENCY  PT (By licensed PT), OT (By licensed OT)     PT Frequency: 5x wk OT Frequency: 5 xkw            Contractures Contractures Info: Not present    Additional Factors Info  Code Status, Allergies Code Status Info: full code Allergies Info: NKA           Current Medications (06/07/2017):  This is the current hospital active medication list Current Facility-Administered Medications  Medication Dose Route Frequency Provider Last Rate Last Dose  . acetaminophen (TYLENOL) tablet 650 mg  650 mg Oral Q6H PRN Opyd, Ilene Qua, MD       Or  . acetaminophen (TYLENOL) suppository 650 mg  650 mg Rectal Q6H PRN Opyd, Ilene Qua, MD      . albuterol (PROVENTIL) (2.5 MG/3ML) 0.083% nebulizer solution 2.5 mg  2.5 mg Nebulization Q2H PRN Emokpae, Courage, MD   2.5 mg at 05/30/17 1451  . amLODipine (NORVASC) tablet 10 mg  10 mg Oral QHS Mauricia Area, MD   10 mg at  06/06/17 2140  . aspirin chewable tablet 81 mg  81 mg Oral Daily Opyd, Ilene Qua, MD   81 mg at 06/07/17 0929  . bisacodyl (DULCOLAX) EC tablet 5 mg  5 mg Oral Daily PRN Opyd, Ilene Qua, MD   5 mg at 06/07/17 0928  . calcitRIOL (ROCALTROL) capsule 0.25 mcg  0.25 mcg Oral Daily Deterding, Jeneen Rinks, MD   0.25 mcg at 06/07/17 0928  . carvedilol (COREG) tablet 25 mg  25 mg Oral BID WC Opyd, Ilene Qua, MD   25 mg at 06/07/17 0834  . clopidogrel (PLAVIX) tablet 75 mg  75 mg Oral Daily Opyd, Ilene Qua, MD   75 mg at 06/07/17 0929  . Darbepoetin Alfa (ARANESP) injection 150  mcg  150 mcg Subcutaneous Q Mon-1800 Deterding, Jeneen Rinks, MD   150 mcg at 06/03/17 1639  . furosemide (LASIX) tablet 80 mg  80 mg Oral Daily Mauricia Area, MD   80 mg at 06/07/17 2878  . guaiFENesin (MUCINEX) 12 hr tablet 600 mg  600 mg Oral BID Emokpae, Courage, MD   600 mg at 06/07/17 0928  . heparin injection 5,000 Units  5,000 Units Subcutaneous Q8H Opyd, Ilene Qua, MD   5,000 Units at 06/07/17 0539  . hydrALAZINE (APRESOLINE) injection 10 mg  10 mg Intravenous Q6H PRN Emokpae, Courage, MD      . hydrALAZINE (APRESOLINE) tablet 100 mg  100 mg Oral Q8H Emokpae, Courage, MD   100 mg at 06/07/17 0539  . HYDROcodone-acetaminophen (NORCO/VICODIN) 5-325 MG per tablet 1-2 tablet  1-2 tablet Oral Q4H PRN Opyd, Ilene Qua, MD   1 tablet at 06/02/17 1513  . insulin aspart (novoLOG) injection 0-5 Units  0-5 Units Subcutaneous QHS [name], MD   2 Units at 06/06/17 2140  . insulin aspart (novoLOG) injection 0-9 Units  0-9 Units Subcutaneous TID WC Opyd, Ilene Qua, MD   3 Units at 06/07/17 [phone]  . isosorbide mononitrate (IMDUR) 24 hr tablet 120 mg  120 mg Oral Daily Emokpae, Courage, MD   120 mg at 06/07/17 0929  . nicotine (NICODERM CQ - dosed in mg/24 hours) patch 14 mg  14 mg Transdermal Daily Emokpae, Courage, MD   14 mg at 06/07/17 0929  . ondansetron (ZOFRAN) tablet 4 mg  4 mg Oral Q6H PRN Opyd, Ilene Qua, MD       Or  . ondansetron (ZOFRAN) injection 4 mg  4 mg Intravenous Q6H PRN Opyd, Ilene Qua, MD      . senna-docusate (Senokot-S) tablet 1 tablet  1 tablet Oral QHS PRN Opyd, Ilene Qua, MD      . sertraline (ZOLOFT) tablet 50 mg  50 mg Oral Daily Emokpae, Courage, MD   50 mg at 06/07/17 0929  . simvastatin (ZOCOR) tablet 20 mg  20 mg Oral Daily Opyd, Ilene Qua, MD   20 mg at 06/07/17 0928  . sodium bicarbonate tablet 1,300 mg  1,300 mg Oral BID Rosita Fire, MD   1,300 mg at 06/07/17 0929  . sodium chloride flush (NS) 0.9 % injection 3 mL  3 mL Intravenous Q12H Opyd, Ilene Qua, MD    3 mL at 06/07/17 2094     Discharge Medications: Please see discharge summary for a list of discharge medications.  Relevant Imaging Results:  Relevant Lab Results:   Additional Information SS# [id number]  Jorge Ny, [place]

## 2017-06-07 NOTE — Progress Notes (Signed)
CSW covering patient this afternoon- was informed by unit CSW that patient lost bed at [facility] today but they will have bed for patient tomorrow- wife aware  Jorge Ny, [place] Social Worker [phone]

## 2017-06-08 LAB — RENAL FUNCTION PANEL
ALBUMIN: 2.9 g/dL — AB (ref 3.5–5.0)
ANION GAP: 11 (ref 5–15)
BUN: 62 mg/dL — ABNORMAL HIGH (ref 6–20)
CALCIUM: 8.9 mg/dL (ref 8.9–10.3)
CO2: 28 mmol/L (ref 22–32)
Chloride: 95 mmol/L — ABNORMAL LOW (ref 101–111)
Creatinine, Ser: 5.95 mg/dL — ABNORMAL HIGH (ref 0.61–1.24)
GFR calc Af Amer: 11 mL/min — ABNORMAL LOW (ref >60)
GFR calc non Af Amer: 10 mL/min — ABNORMAL LOW (ref >60)
GLUCOSE: 213 mg/dL — AB (ref 65–99)
Phosphorus: 5.8 mg/dL — ABNORMAL HIGH (ref 2.5–4.6)
Potassium: 4 mmol/L (ref 3.5–5.1)
SODIUM: 134 mmol/L — AB (ref 135–145)

## 2017-06-08 LAB — GLUCOSE, CAPILLARY
GLUCOSE-CAPILLARY: 171 mg/dL — AB (ref 65–99)
GLUCOSE-CAPILLARY: 173 mg/dL — AB (ref 65–99)
Glucose-Capillary: 188 mg/dL — ABNORMAL HIGH (ref 65–99)
Glucose-Capillary: 195 mg/dL — ABNORMAL HIGH (ref 65–99)

## 2017-06-08 NOTE — Clinical Social Work Placement (Signed)
   CLINICAL SOCIAL WORK PLACEMENT  NOTE  Date:  06/08/2017  Patient Details  Name: Jeffery Young MRN: [id number] Date of Birth: 1965/10/06  Clinical Social Work is seeking post-discharge placement for this patient at the [place] level of care (*CSW will initial, date and re-position this form in  chart as items are completed):  Yes   Patient/family provided with [place] Work Department's list of facilities offering this level of care within the geographic area requested by the patient (or if unable, by the patient's family).  Yes   Patient/family informed of their freedom to choose among providers that offer the needed level of care, that participate in Medicare, Medicaid or managed care program needed by the patient, have an available bed and are willing to accept the patient.  Yes   Patient/family informed of [place]'s ownership interest in [facility] and [facility], as well as of the fact that they are under no obligation to receive care at these facilities.  PASRR submitted to EDS on       PASRR number received on       Existing PASRR number confirmed on       FL2 transmitted to all facilities in geographic area requested by pt/family on       FL2 transmitted to all facilities within larger geographic area on       Patient informed that his/her managed care company has contracts with or will negotiate with certain facilities, including the following:        Yes   Patient/family informed of bed offers received.  Patient chooses bed at [facility]     Physician recommends and patient chooses bed at      Patient to be transferred to [facility] on 06/08/17.  Patient to be transferred to facility by PTAR     Patient family notified on 06/08/17 of transfer.  Name of family member notified:  Ayesha     PHYSICIAN       Additional Comment:    _______________________________________________ Eileen Stanford, LCSW 06/08/2017, 9:40 AM

## 2017-06-08 NOTE — Clinical Social Work Note (Addendum)
Jeffery Young has determined they are unable to take pt today. Pt is walking 400 feet and is alert and oriented. CSW staffed case with AD. At this time CSW is going to reach out to [facility] to proceed with [facility].   [place], [place]

## 2017-06-08 NOTE — Progress Notes (Signed)
Patient seen this morning, reports he is tired. No changes from yesterday at time of expected discharge. Please see DC summary dated 5/3.   Vance Gather, MD 06/08/2017 11:14 AM

## 2017-06-08 NOTE — Clinical Social Work Note (Signed)
Pt will d/c to [facility] today. Pt's room is being prepared. Facility will let CSW know when pt's room is ready.  [place], [place]
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
## 2017-06-09 LAB — RENAL FUNCTION PANEL
ALBUMIN: 3.1 g/dL — AB (ref 3.5–5.0)
Anion gap: 12 (ref 5–15)
BUN: 63 mg/dL — ABNORMAL HIGH (ref 6–20)
CALCIUM: 9.1 mg/dL (ref 8.9–10.3)
CO2: 29 mmol/L (ref 22–32)
Chloride: 93 mmol/L — ABNORMAL LOW (ref 101–111)
Creatinine, Ser: 6.32 mg/dL — ABNORMAL HIGH (ref 0.61–1.24)
GFR, EST AFRICAN AMERICAN: 11 mL/min — AB (ref >60)
GFR, EST NON AFRICAN AMERICAN: 9 mL/min — AB (ref >60)
Glucose, Bld: 241 mg/dL — ABNORMAL HIGH (ref 65–99)
Phosphorus: 5.4 mg/dL — ABNORMAL HIGH (ref 2.5–4.6)
Potassium: 3.8 mmol/L (ref 3.5–5.1)
SODIUM: 134 mmol/L — AB (ref 135–145)

## 2017-06-09 LAB — GLUCOSE, CAPILLARY
GLUCOSE-CAPILLARY: 166 mg/dL — AB (ref 65–99)
GLUCOSE-CAPILLARY: 214 mg/dL — AB (ref 65–99)

## 2017-06-09 MED ORDER — HYDRALAZINE HCL 50 MG PO TABS
100.0000 mg | ORAL_TABLET | Freq: Four times a day (QID) | ORAL | Status: DC
  Administered 2017-06-09: 100 mg via ORAL
  Filled 2017-06-09: qty 2

## 2017-06-09 MED ORDER — TRAZODONE HCL 50 MG PO TABS: 50.0000 mg | ORAL_TABLET | Freq: Every day | ORAL | Status: DC

## 2017-06-09 NOTE — Clinical Social Work Placement (Signed)
   CLINICAL SOCIAL WORK PLACEMENT  NOTE  Date:  06/09/2017  Patient Details  Name: Jeffery Young MRN: [id number] Date of Birth: [birth year]  Clinical Social Work is seeking post-discharge placement for this patient at the [place] level of care (*CSW will initial, date and re-position this form in  chart as items are completed):  Yes   Patient/family provided with [place] Work Department's list of facilities offering this level of care within the geographic area requested by the patient (or if unable, by the patient's family).  Yes   Patient/family informed of their freedom to choose among providers that offer the needed level of care, that participate in Medicare, Medicaid or managed care program needed by the patient, have an available bed and are willing to accept the patient.  Yes   Patient/family informed of [place]'s ownership interest in [facility] and [facility] as well as of the fact that they are under no obligation to receive care at these facilities.  PASRR submitted to EDS on       PASRR number received on       Existing PASRR number confirmed on       FL2 transmitted to all facilities in geographic area requested by pt/family on       FL2 transmitted to all facilities within larger geographic area on       Patient informed that his/her managed care company has contracts with or will negotiate with certain facilities, including the following:        Yes   Patient/family informed of bed offers received.  Patient chooses bed at [facility]     Physician recommends and patient chooses bed at      Patient to be transferred to [facility] on 06/08/17.  Patient to be transferred to facility by PTAR     Patient family notified on 06/08/17 of transfer.  Name of family member notified:  Ayesha     PHYSICIAN       Additional Comment:    _______________________________________________ Eileen Stanford, LCSW 06/09/2017, 9:57 AM

## 2017-06-09 NOTE — Discharge Summary (Signed)
Physician Discharge Summary  Jeffery Young WNU:[id number] DOB: 03-16-1965 DOA: 05/28/2017  PCP: Jeffery Cowman, MD  Admit date: 05/28/2017 Discharge date: 06/09/2017  Admitted From: Home Disposition: SNF   Recommendations for Outpatient Follow-up:  1. Follow up with nephrology, Dr. Carolin Young for follow up of renal disease. Anticipate he will need access for HD going forward. Vein mapping performed prior to discharge.  2. Monitor BMP in 1 week 3. Follow up with psychiatry in 1-2 weeks. Started SSRI and trazodone qHS.  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 His previous living situation with his wife is no longer an option due to social discord and the patient's impaired gait and vision which requires 24-hr supervision at this time. Therapy evaluations have recommended SNF at discharge, which is felt to be the only safe plan. Trazodone has been added for insomnia and psychiatry follow up will be instrumental in improving compliance.   Discharge Diagnoses:  Principal Problem:   AKI (acute kidney injury) on CKD III Active Problems:   Type 2 diabetes mellitus with diabetic nephropathy, without long-term current use of insulin (HCC)   Tobacco abuse   Coronary artery disease due to lipid rich plaque   Elevated troponin   Uncontrolled hypertension   Depression   Renal insufficiency   Normocytic anemia   History of completed stroke  AKI on stageIIICKD, now stage V  CKD: Complement levels unremarkable. Renal ultrasound consistent with medical renal kidney disease without obstructive uropathy - Nephrology consulted, will follow up with Dr. Carolin Young and get access placement as an outpatient. Vein mapping has been performed.  -Making urine, continue lasix 80mg  po daily. Will follow up metabolic panel ~weekly.  - Continue calcitriol, bicarbonate - Control HTN, DM as much as possible.  - Avoid nephrotoxins, namely ibuprofen which the patient was taking consistently.   History of CVA with residual deficits: CT head is negative for acute findings, but notable for old lacunar infarcts in basal ganglia and brainstem, as well as progression of microvascular ischemic disease, patient with left-sided weakness, speech issues, and drastically diminished visual acuity which are chronic.  - Continue ASA, plavix, statin - Will need SNF for rehabilitation per PT recommendations.   Depression: Neurovegetative symptoms are significant and will continue to impact the delivery of medical care.  - Will continue to require close psychiatry follow up as outpatient. - Started sertraline 25mg  > 50mg  due to severity of symptoms. May increase 50mg  > 100mg  pending psychiatry recommendations.  - Trazodone added for insomnia, doesn't require renal dosing.  HTN with hypertensive urgency:BP improved (190/11 at admission)with additional medications. - Continue coreg, norvasc, BiDil and hydralazine. Still slightly above goal, and has had steady decrease from severe range, so consider further increase of hydralazine to 100mg  q6h from q8h vs. additional agent. He's on max doses of others.   T2DM: HbA1c 6.9%.  - Discontinue home metformin. Could consider tradjenta 5mg  daily as replacement.  Decreased visual acuity: ~light and shape discerning only on right eye, ~20/200 on left eye due to microvascular disease and diabetic retinopathy.  - PT/OT to continue  CAD: With mildly  elevated  troponin (0.04) due to hypertensive urgency and decreased renal clearance. No chest pain.  - Continue ASA, plavix, statin, beta blocker  Tobacco use:  - Brief cessation counseling provided.  - Nicotine patch  Discharge Instructions Discharge Instructions    Diet - low sodium heart healthy   Complete by:  As directed    Increase activity slowly   Complete by:  As directed      Allergies as of 06/09/2017   No Known Allergies     Medication List    STOP taking these medications   ibuprofen 800 MG tablet Commonly known as:  ADVIL,MOTRIN   metFORMIN 1000 MG tablet Commonly known as:  GLUCOPHAGE   traMADol 50 MG tablet Commonly known as:  ULTRAM     TAKE these medications   amLODipine 10 MG tablet Commonly known as:  NORVASC Take 1 tablet (10 mg total) by mouth at bedtime.   aspirin 81 MG chewable tablet Chew 1 tablet (81 mg total) by mouth daily.   calcitRIOL 0.25 MCG capsule Commonly known as:  ROCALTROL Take 1 capsule (0.25 mcg total) by mouth daily.   carvedilol 25 MG tablet Commonly known as:  COREG Take 1 tablet (25 mg total) by mouth 2 (two) times daily with a meal.   clopidogrel 75 MG tablet Commonly known as:  PLAVIX TAKE ONE TABLET BY MOUTH DAILY   furosemide 80 MG tablet Commonly known as:  LASIX Take 1 tablet (80 mg total) by mouth daily.   hydrALAZINE 100 MG tablet Commonly known as:  APRESOLINE Take 1 tablet (100 mg total) by mouth 3 (three) times daily. What changed:    medication strength  how much to take   isosorbide mononitrate 120 MG 24 hr tablet Commonly known as:  IMDUR Take 1 tablet (120 mg total) by mouth daily.   MULTIVITAMIN ADULT PO Take 1 tablet by mouth daily.   nitroGLYCERIN 0.4 MG SL tablet Commonly known as:  NITROSTAT Place 1 tablet (0.4 mg total) under the tongue every 5 (five) minutes x 3 doses as needed for chest pain.   sertraline 50 MG tablet Commonly known as:  ZOLOFT Take 1 tablet (50 mg total) by  mouth daily.   simvastatin 20 MG tablet Commonly known as:  ZOCOR Take 20 mg by mouth daily.   sodium bicarbonate 650 MG tablet Take 2 tablets (1,300 mg total) by mouth 2 (two) times daily.   traZODone 50 MG tablet Commonly known as:  DESYREL Take 1 tablet (50 mg total) by mouth at bedtime.       Contact information for follow-up providers    Jeffery Cowman, MD Follow up.   Specialty:  Family Medicine Contact information: [place] [address] [phone]        Rosita Fire, MD. Schedule an appointment as soon as possible for a visit in 2 week(s).   Specialties:  Nephrology, Internal Medicine Contact information: [place] [address] [phone]            Contact information for after-discharge care    [facility] .   Service:  Skilled Nursing Contact information: [address] [place] [phone]                 No Known Allergies  Consultations:  Nephrology, Dr. Jimmy Footman  Procedures/Studies: Dg Chest 2 View  Result Date: 05/28/2017 CLINICAL DATA:  Hypertension EXAM: CHEST - 2 VIEW COMPARISON:  March 09, 2016 FINDINGS: Lungs are clear. Heart size and pulmonary vascularity are normal. No adenopathy. No bone lesions. IMPRESSION: No edema or consolidation. Electronically Signed   By: Lowella Grip III M.D.   On: 05/28/2017 14:33   Ct Head Wo Contrast  Result Date: 05/28/2017 CLINICAL DATA:  52 y/o  M; MI 1 year ago with deterioration. EXAM: CT HEAD WITHOUT CONTRAST TECHNIQUE: Contiguous axial images were obtained from the base of the skull through the vertex without intravenous contrast. COMPARISON:  03/09/2016 CT head FINDINGS: Brain: No evidence of acute infarction, hemorrhage, hydrocephalus, extra-axial collection or mass lesion/mass effect. Small chronic lacunar infarcts are present within the bilateral lentiform nuclei, right thalamus, and right  pons. A focus within the right putamen is new from the prior study. There is a background of chronic microvascular ischemic changes and parenchymal volume loss of the brain that is mildly progressed. Vascular: Calcific atherosclerosis of carotid siphons. No hyperdense vessel identified. Skull: Normal. Negative for fracture or focal lesion. Sinuses/Orbits: No acute finding. Other: Left intra-ocular lens replacement. IMPRESSION: 1. No acute intracranial abnormality identified. 2. Mild progression of chronic microvascular ischemic changes and parenchymal volume loss of the brain. Several small chronic lacunar infarcts in basal ganglia and brainstem. Electronically Signed   By: Kristine Garbe M.D.   On: 05/28/2017 13:30   US Renal  Result Date: 05/29/2017 CLINICAL DATA:  Renal failure. History of diabetes and hypertension. EXAM: RENAL / URINARY TRACT ULTRASOUND COMPLETE COMPARISON:  None. FINDINGS: Right Kidney: Length: 10.7 cm. Diffusely increased parenchymal echotexture consistent with chronic medical renal disease. No hydronephrosis. Stone in the midpole right kidney measuring 11 mm diameter. Left Kidney: Length: 11.1 cm. Diffusely increased renal parenchymal echotexture consistent with chronic medical renal disease. No hydronephrosis or solid mass identified. Bladder: No bladder wall thickening or filling defects identified. IMPRESSION: 1. Diffusely increased parenchymal echotexture to both kidneys consistent with chronic medical renal disease. 2. Nonobstructing 11 mm stone in the right kidney. 3. No hydronephrosis. Electronically Signed   By: Lucienne Capers M.D.   On: 05/29/2017 00:32   Subjective: Didn't sleep again last night, had significant hiccups but no dyspnea, chest pain, abd pain, N/V/D. Denies SI.    Discharge Exam: Vitals:   06/09/17 0800 06/09/17 0816  BP: (!) 162/75 (!) 162/75  Pulse: 70 70  Resp:    Temp: 98.6 F (37 C)   SpO2: 93%    Gen: 52yo M with flat affect.   Pulm: Non-labored breathing room air. Clear to auscultation bilaterally.  CV: Regular rate and rhythm. No murmur, rub, or gallop. No JVD, no pedal edema. GI: Abdomen soft, non-tender, non-distended, with normoactive bowel sounds. No organomegaly or masses felt. Ext: Warm, no deformities Skin: No rashes, lesions or ulcers Neuro: Alert and oriented. Unsteady gait, senses shapes only in right eye, very diminished in left but can read very large, close type. lateral strabismus noted, stable.  Psych: Judgement and insight appear fair. Mood depressed, withdrawn/flattened affect. No SI.  Labs: Basic Metabolic Panel: Recent Labs  Lab 06/05/17 0202 06/06/17 0229 06/07/17 0324 06/08/17 0320 06/09/17 0242  NA 137 135 133* 134* 134*  K 4.1 3.9 3.7 4.0 3.8  CL 102 100* 96* 95* 93*  CO2 26 26 27 28 29   GLUCOSE 199* 215* 217* 213* 241*  BUN 59* 58* 61* 62* 63*  CREATININE 5.32* 5.70* 5.86* 5.95* 6.32*  CALCIUM 9.0 8.8* 8.7* 8.9 9.1  PHOS 6.0* 5.5* 5.3* 5.8* 5.4*   Liver Function Tests: Recent Labs  Lab 06/05/17 0202 06/06/17 0229 06/07/17 0324 06/08/17 0320 06/09/17 0242  ALBUMIN 2.7* 2.7* 2.9* 2.9* 3.1*   CBG: Recent Labs  Lab 06/08/17 0809 06/08/17 1159 06/08/17 1657 06/08/17 2120 06/09/17 0815  GLUCAP 188* 195* 171* 173* 166*   Urinalysis    Component Value Date/Time   COLORURINE YELLOW 05/28/2017 [place] 05/28/2017 1253   LABSPEC 1.025 05/28/2017 1253   PHURINE 6.0 05/28/2017 1253   GLUCOSEU 250 (A) 05/28/2017 1253   HGBUR SMALL (A) 05/28/2017 1253   BILIRUBINUR NEGATIVE 05/28/2017 [place] 05/28/2017 1253   PROTEINUR >300 (A) 05/28/2017 1253   UROBILINOGEN 1.0 08/21/2012 2250   NITRITE NEGATIVE 05/28/2017 1253   LEUKOCYTESUR NEGATIVE 05/28/2017 1253    Time coordinating discharge: Approximately 40 minutes  Patrecia Pour, MD  [facility] 06/09/2017, 9:52 AM Pager [phone]

## 2017-06-09 NOTE — Progress Notes (Signed)
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 Nephrology was consulted, though the patient did not need hemodialysis. SSRI was started for depression though neurovegetative symptoms continue. His previous living situation with his wife is no longer an option due to social discord and the patient's impaired gait and vision which requires 24-hr supervision at this time. Therapy evaluations have recommended SNF at discharge, which is felt to be the only safe plan. Trazodone has been added for insomnia and psychiatry is consulted for further recommendations.  Subjective: Didn't sleep again last night, had significant hiccups but no dyspnea, chest pain, abd pain, N/V/D. Denies SI.  Objective: BP (!) 162/75   Pulse 70   Temp 98.6 F (37 C) (Oral)   Resp 15   Ht 5\' 5"  (1.651 m)   Wt 55.7 kg (122 lb 12.8 oz)   SpO2 93%   BMI 20.43 kg/m   Gen: 52yo M with flat affect.  Pulm: Non-labored breathing room air. Clear to auscultation bilaterally.  CV: Regular rate and rhythm. No murmur, rub, or gallop. No JVD, no pedal edema. GI: Abdomen soft, non-tender, non-distended, with normoactive bowel sounds. No organomegaly or masses felt. Ext: Warm, no deformities Skin: No rashes, lesions or ulcers Neuro: Alert and oriented. Unsteady gait, senses shapes only in right eye, very diminished in left but can read very large, close type. lateral strabismus noted, stable.  Psych: Judgement and insight appear fair. Mood depressed, withdrawn/flattened affect. No SI.  Assessment & Plan: Principal Problem:   AKI (acute kidney injury)  on CKD III Active Problems:   Type 2 diabetes mellitus with diabetic nephropathy, without long-term current use of insulin (HCC)   Tobacco abuse   Coronary artery disease due to lipid rich plaque   Elevated troponin   Uncontrolled hypertension   Depression   Renal insufficiency   Normocytic anemia   History of completed stroke  AKI on stage III CKD, now stage V CKD: Complement levels unremarkable. Renal ultrasound consistent with medical renal kidney disease without obstructive uropathy - Nephrology consulted, will follow up with Dr. Carolin Young and get access placement as an outpatient. Vein mapping has been performed.  -Making urine, continue lasix 80mg  po daily. Will follow up metabolic panel ~weekly.  - Continue calcitriol, bicarbonate - Control HTN, DM as much as possible.  - Avoid nephrotoxins, namely ibuprofen which the patient was taking consistently.   History of CVA with residual deficits: CT head is negative for acute findings, but notable for old lacunar infarcts in basal ganglia and brainstem, as well as progression of microvascular ischemic disease, patient with left-sided weakness, speech issues, and drastically diminished visual acuity which are chronic.  - Continue ASA, plavix, statin - Will need SNF for rehabilitation per PT recommendations.   Depression: Neurovegetative symptoms are significant and will continue to impact the delivery of medical care.  - Psychiatry consulted as an inpatient. Will continue to require close psychiatry follow up as outpatient. - Started sertraline 25mg  > 50mg  due to severity of symptoms. May increase 50mg  > 100mg  pending psychiatry recommendations.  - Trazodone added for insomnia, doesn't require renal dosing.  HTN with hypertensive urgency: BP improved (190/11 at admission) with additional medications.  -  Continue coreg, norvasc, BiDil and hydralazine. Still slightly above goal, and has had steady decrease, so safe to increase  hydralazine to 100mg  q6h from q8h. He's on max doses of others.   T2DM: HbA1c 6.9%.  - Discontinue home metformin. Could consider tradjenta 5mg  daily as replacement.  Decreased visual acuity: ~light and shape discerning only on right eye, ~20/200 on left eye due to microvascular disease and diabetic retinopathy.  - PT/OT to continue  CAD: With mildly elevated troponin (0.04) due to hypertensive urgency and decreased renal clearance. No chest pain.  - Continue ASA, plavix, statin, beta blocker  Tobacco use:  - Brief cessation counseling provided.  - Nicotine patch  DVT prophylaxis: Heparin Code Status: Full Family Communication: None at bedside Disposition Plan: SNF when bed available, will need repeat insurance authorization. Psychiatry consult pending.  Time spent: 25 minutes.  Jeffery Pour, MD [facility] [email or web address] Password [email or web address] 06/09/2017, 9:25 AM

## 2017-06-09 NOTE — Progress Notes (Signed)
PTA at bedside to transport pt to [facility].  Report given.  Pt belongings given to EMS.  Pt discharged via stretcher, EMS to [facility].

## 2017-06-09 NOTE — Progress Notes (Signed)
[facility] care and spoke with [facility], report given.  No quiestions verbalized.  Will transfer pt to [place] care when ambulance arrives.  Per SW wife aware and agreeable to transfer.

## 2017-06-09 NOTE — Clinical Social Work Note (Signed)
CSW spoke with pt at bedside. Pt denies any thoughts of suicide. Pt denies any thoughts of harming any other individuals. CSW explained to pt that he will transfer to [facility] today at 12:00--pt agreeable.  [place], [place]

## 2017-06-09 NOTE — Clinical Social Work Note (Addendum)
There are several barriers to a safe d/c. Pt's poor vision and unsteady gait puts pt at risk for falls. MD has ordered a psych consult due to pt's significant depression. CSW paged PT to reevaluate pt. CSW will expand SNF search.  [place], [place]

## 2017-06-09 NOTE — Clinical Social Work Note (Signed)
Clinical Social Worker facilitated patient discharge including contacting patient family and facility to confirm patient discharge plans.  Clinical information faxed to facility and family agreeable with plan.  CSW arranged ambulance transport via [facility] to [place] will go to room 224B.  RN to call [phone] for report prior to discharge.  Clinical Social Worker will sign off for now as social work intervention is no longer needed. Please consult [place] again if new need arises.  [place], [place]

## 2017-06-25 ENCOUNTER — Encounter (HOSPITAL_COMMUNITY): Payer: Self-pay | Admitting: Neurology

## 2017-06-25 ENCOUNTER — Inpatient Hospital Stay (HOSPITAL_COMMUNITY)
Admission: EM | Admit: 2017-06-25 | Discharge: 2017-07-04 | DRG: 264 | Disposition: A | Discharge status: Skilled Nursing Facility | Payer: BC Managed Care – PPO | Attending: Internal Medicine | Admitting: Internal Medicine

## 2017-06-25 DIAGNOSIS — E1121 Type 2 diabetes mellitus with diabetic nephropathy: Secondary | ICD-10-CM | POA: Diagnosis present

## 2017-06-25 DIAGNOSIS — I132 Hypertensive heart and chronic kidney disease with heart failure and with stage 5 chronic kidney disease, or end stage renal disease: Principal | ICD-10-CM | POA: Diagnosis present

## 2017-06-25 DIAGNOSIS — E875 Hyperkalemia: Secondary | ICD-10-CM | POA: Diagnosis present

## 2017-06-25 DIAGNOSIS — I10 Essential (primary) hypertension: Secondary | ICD-10-CM | POA: Diagnosis present

## 2017-06-25 DIAGNOSIS — N189 Chronic kidney disease, unspecified: Secondary | ICD-10-CM | POA: Diagnosis present

## 2017-06-25 DIAGNOSIS — N186 End stage renal disease: Secondary | ICD-10-CM | POA: Diagnosis present

## 2017-06-25 DIAGNOSIS — E1122 Type 2 diabetes mellitus with diabetic chronic kidney disease: Secondary | ICD-10-CM | POA: Diagnosis present

## 2017-06-25 DIAGNOSIS — I252 Old myocardial infarction: Secondary | ICD-10-CM | POA: Diagnosis not present

## 2017-06-25 DIAGNOSIS — F32A Depression, unspecified: Secondary | ICD-10-CM | POA: Diagnosis present

## 2017-06-25 DIAGNOSIS — Z7982 Long term (current) use of aspirin: Secondary | ICD-10-CM

## 2017-06-25 DIAGNOSIS — E1165 Type 2 diabetes mellitus with hyperglycemia: Secondary | ICD-10-CM | POA: Diagnosis present

## 2017-06-25 DIAGNOSIS — D631 Anemia in chronic kidney disease: Secondary | ICD-10-CM | POA: Diagnosis present

## 2017-06-25 DIAGNOSIS — Z955 Presence of coronary angioplasty implant and graft: Secondary | ICD-10-CM

## 2017-06-25 DIAGNOSIS — F329 Major depressive disorder, single episode, unspecified: Secondary | ICD-10-CM | POA: Diagnosis present

## 2017-06-25 DIAGNOSIS — Z95828 Presence of other vascular implants and grafts: Secondary | ICD-10-CM

## 2017-06-25 DIAGNOSIS — Z992 Dependence on renal dialysis: Secondary | ICD-10-CM

## 2017-06-25 DIAGNOSIS — Z79899 Other long term (current) drug therapy: Secondary | ICD-10-CM | POA: Diagnosis not present

## 2017-06-25 DIAGNOSIS — Z8673 Personal history of transient ischemic attack (TIA), and cerebral infarction without residual deficits: Secondary | ICD-10-CM | POA: Diagnosis not present

## 2017-06-25 DIAGNOSIS — N2581 Secondary hyperparathyroidism of renal origin: Secondary | ICD-10-CM | POA: Diagnosis present

## 2017-06-25 DIAGNOSIS — Z7902 Long term (current) use of antithrombotics/antiplatelets: Secondary | ICD-10-CM

## 2017-06-25 DIAGNOSIS — E7800 Pure hypercholesterolemia, unspecified: Secondary | ICD-10-CM | POA: Diagnosis present

## 2017-06-25 DIAGNOSIS — I2510 Atherosclerotic heart disease of native coronary artery without angina pectoris: Secondary | ICD-10-CM | POA: Diagnosis present

## 2017-06-25 DIAGNOSIS — I5042 Chronic combined systolic (congestive) and diastolic (congestive) heart failure: Secondary | ICD-10-CM | POA: Diagnosis present

## 2017-06-25 DIAGNOSIS — Z87891 Personal history of nicotine dependence: Secondary | ICD-10-CM

## 2017-06-25 DIAGNOSIS — N185 Chronic kidney disease, stage 5: Secondary | ICD-10-CM | POA: Diagnosis not present

## 2017-06-25 DIAGNOSIS — D649 Anemia, unspecified: Secondary | ICD-10-CM | POA: Diagnosis not present

## 2017-06-25 HISTORY — DX: End stage renal disease: N18.6

## 2017-06-25 HISTORY — DX: Dependence on renal dialysis: Z99.2

## 2017-06-25 LAB — CBC WITH DIFFERENTIAL/PLATELET
ABS IMMATURE GRANULOCYTES: 0 10*3/uL (ref 0.0–0.1)
BASOS ABS: 0 10*3/uL (ref 0.0–0.1)
Basophils Relative: 1 %
Eosinophils Absolute: 0.1 10*3/uL (ref 0.0–0.7)
Eosinophils Relative: 2 %
HCT: 32.9 % — ABNORMAL LOW (ref 39.0–52.0)
Hemoglobin: 10.5 g/dL — ABNORMAL LOW (ref 13.0–17.0)
IMMATURE GRANULOCYTES: 1 %
Lymphocytes Relative: 15 %
Lymphs Abs: 0.9 10*3/uL (ref 0.7–4.0)
MCH: 29.6 pg (ref 26.0–34.0)
MCHC: 31.9 g/dL (ref 30.0–36.0)
MCV: 92.7 fL (ref 78.0–100.0)
MONOS PCT: 8 %
Monocytes Absolute: 0.5 10*3/uL (ref 0.1–1.0)
NEUTROS ABS: 4.6 10*3/uL (ref 1.7–7.7)
NEUTROS PCT: 73 %
PLATELETS: 278 10*3/uL (ref 150–400)
RBC: 3.55 MIL/uL — ABNORMAL LOW (ref 4.22–5.81)
RDW: 12.9 % (ref 11.5–15.5)
WBC: 6.2 10*3/uL (ref 4.0–10.5)

## 2017-06-25 LAB — RETICULOCYTES
RBC.: 3.47 MIL/uL — ABNORMAL LOW (ref 4.22–5.81)
Retic Count, Absolute: 45.1 10*3/uL (ref 19.0–186.0)
Retic Ct Pct: 1.3 % (ref 0.4–3.1)

## 2017-06-25 LAB — VITAMIN B12: Vitamin B-12: 524 pg/mL (ref 180–914)

## 2017-06-25 LAB — BASIC METABOLIC PANEL
ANION GAP: 13 (ref 5–15)
BUN: 92 mg/dL — ABNORMAL HIGH (ref 6–20)
CO2: 28 mmol/L (ref 22–32)
Calcium: 9.1 mg/dL (ref 8.9–10.3)
Chloride: 90 mmol/L — ABNORMAL LOW (ref 101–111)
Creatinine, Ser: 7.89 mg/dL — ABNORMAL HIGH (ref 0.61–1.24)
GFR calc non Af Amer: 7 mL/min — ABNORMAL LOW (ref >60)
GFR, EST AFRICAN AMERICAN: 8 mL/min — AB (ref >60)
Glucose, Bld: 321 mg/dL — ABNORMAL HIGH (ref 65–99)
Potassium: 5.2 mmol/L — ABNORMAL HIGH (ref 3.5–5.1)
Sodium: 131 mmol/L — ABNORMAL LOW (ref 135–145)

## 2017-06-25 LAB — URINALYSIS, ROUTINE W REFLEX MICROSCOPIC
Bacteria, UA: NONE SEEN
Bilirubin Urine: NEGATIVE
HGB URINE DIPSTICK: NEGATIVE
Ketones, ur: NEGATIVE mg/dL
LEUKOCYTES UA: NEGATIVE
NITRITE: NEGATIVE
PROTEIN: 100 mg/dL — AB
Specific Gravity, Urine: 1.009 (ref 1.005–1.030)
pH: 7 (ref 5.0–8.0)

## 2017-06-25 LAB — GLUCOSE, CAPILLARY: Glucose-Capillary: 190 mg/dL — ABNORMAL HIGH (ref 65–99)

## 2017-06-25 LAB — MAGNESIUM: MAGNESIUM: 2.7 mg/dL — AB (ref 1.7–2.4)

## 2017-06-25 LAB — FERRITIN: Ferritin: 155 ng/mL (ref 24–336)

## 2017-06-25 LAB — IRON AND TIBC
IRON: 48 ug/dL (ref 45–182)
SATURATION RATIOS: 21 % (ref 17.9–39.5)
TIBC: 228 ug/dL — AB (ref 250–450)
UIBC: 180 ug/dL

## 2017-06-25 LAB — CBG MONITORING, ED: GLUCOSE-CAPILLARY: 287 mg/dL — AB (ref 65–99)

## 2017-06-25 LAB — FOLATE: Folate: 23.5 ng/mL (ref >5.9)

## 2017-06-25 MED ORDER — ZOLPIDEM TARTRATE 5 MG PO TABS
5.0000 mg | ORAL_TABLET | Freq: Every evening | ORAL | Status: DC | PRN
  Administered 2017-06-25: 5 mg via ORAL
  Filled 2017-06-25 (×2): qty 1

## 2017-06-25 MED ORDER — CALCITRIOL 0.25 MCG PO CAPS
0.2500 ug | ORAL_CAPSULE | Freq: Every day | ORAL | Status: DC
  Administered 2017-06-26 – 2017-07-03 (×8): 0.25 ug via ORAL
  Filled 2017-06-25 (×8): qty 1

## 2017-06-25 MED ORDER — INSULIN ASPART 100 UNIT/ML ~~LOC~~ SOLN
0.0000 [IU] | Freq: Every day | SUBCUTANEOUS | Status: DC
  Administered 2017-06-26: 2 [IU] via SUBCUTANEOUS
  Administered 2017-06-27: 4 [IU] via SUBCUTANEOUS
  Administered 2017-06-28: 3 [IU] via SUBCUTANEOUS
  Administered 2017-06-30: 2 [IU] via SUBCUTANEOUS

## 2017-06-25 MED ORDER — NEPRO/CARBSTEADY PO LIQD
237.0000 mL | Freq: Three times a day (TID) | ORAL | Status: DC | PRN
  Filled 2017-06-25: qty 237

## 2017-06-25 MED ORDER — SORBITOL 70 % SOLN
30.0000 mL | Status: DC | PRN
  Filled 2017-06-25: qty 30

## 2017-06-25 MED ORDER — SERTRALINE HCL 50 MG PO TABS
50.0000 mg | ORAL_TABLET | Freq: Every day | ORAL | Status: DC
  Administered 2017-06-26 – 2017-07-04 (×9): 50 mg via ORAL
  Filled 2017-06-25 (×9): qty 1

## 2017-06-25 MED ORDER — HYDROXYZINE HCL 25 MG PO TABS: 25.0000 mg | ORAL_TABLET | Freq: Three times a day (TID) | ORAL | Status: DC | PRN

## 2017-06-25 MED ORDER — DOCUSATE SODIUM 283 MG RE ENEM
1.0000 | ENEMA | RECTAL | Status: DC | PRN
  Filled 2017-06-25: qty 1

## 2017-06-25 MED ORDER — HYDRALAZINE HCL 50 MG PO TABS
100.0000 mg | ORAL_TABLET | Freq: Three times a day (TID) | ORAL | Status: DC
  Administered 2017-06-25 – 2017-07-04 (×24): 100 mg via ORAL
  Filled 2017-06-25 (×25): qty 2

## 2017-06-25 MED ORDER — ONDANSETRON HCL 4 MG/2ML IJ SOLN: 4.0000 mg | Freq: Four times a day (QID) | INTRAMUSCULAR | Status: DC | PRN

## 2017-06-25 MED ORDER — SODIUM CHLORIDE 0.9% FLUSH
3.0000 mL | Freq: Two times a day (BID) | INTRAVENOUS | Status: DC
  Administered 2017-06-26 – 2017-07-03 (×15): 3 mL via INTRAVENOUS

## 2017-06-25 MED ORDER — SIMVASTATIN 20 MG PO TABS
20.0000 mg | ORAL_TABLET | Freq: Every day | ORAL | Status: DC
  Administered 2017-06-26 – 2017-07-04 (×9): 20 mg via ORAL
  Filled 2017-06-25 (×9): qty 1

## 2017-06-25 MED ORDER — CARVEDILOL 25 MG PO TABS
25.0000 mg | ORAL_TABLET | Freq: Two times a day (BID) | ORAL | Status: DC
  Administered 2017-06-25 – 2017-07-04 (×16): 25 mg via ORAL
  Filled 2017-06-25 (×16): qty 1

## 2017-06-25 MED ORDER — SODIUM BICARBONATE 650 MG PO TABS
1300.0000 mg | ORAL_TABLET | Freq: Two times a day (BID) | ORAL | Status: DC
  Administered 2017-06-25 – 2017-06-28 (×6): 1300 mg via ORAL
  Filled 2017-06-25 (×7): qty 2

## 2017-06-25 MED ORDER — AMLODIPINE BESYLATE 10 MG PO TABS
10.0000 mg | ORAL_TABLET | Freq: Every day | ORAL | Status: DC
  Administered 2017-06-25 – 2017-07-03 (×9): 10 mg via ORAL
  Filled 2017-06-25 (×9): qty 1

## 2017-06-25 MED ORDER — TRAZODONE HCL 50 MG PO TABS
50.0000 mg | ORAL_TABLET | Freq: Every day | ORAL | Status: DC
  Administered 2017-06-25 – 2017-07-03 (×9): 50 mg via ORAL
  Filled 2017-06-25 (×9): qty 1

## 2017-06-25 MED ORDER — CLOPIDOGREL BISULFATE 75 MG PO TABS
75.0000 mg | ORAL_TABLET | Freq: Every day | ORAL | Status: DC
  Administered 2017-06-26: 75 mg via ORAL
  Filled 2017-06-25: qty 1

## 2017-06-25 MED ORDER — CAMPHOR-MENTHOL 0.5-0.5 % EX LOTN
1.0000 "application " | TOPICAL_LOTION | Freq: Three times a day (TID) | CUTANEOUS | Status: DC | PRN
  Filled 2017-06-25: qty 222

## 2017-06-25 MED ORDER — ACETAMINOPHEN 325 MG PO TABS: 650.0000 mg | ORAL_TABLET | Freq: Four times a day (QID) | ORAL | Status: DC | PRN

## 2017-06-25 MED ORDER — ENOXAPARIN SODIUM 30 MG/0.3ML ~~LOC~~ SOLN
30.0000 mg | SUBCUTANEOUS | Status: DC
  Administered 2017-06-25 – 2017-06-27 (×3): 30 mg via SUBCUTANEOUS
  Filled 2017-06-25 (×3): qty 0.3

## 2017-06-25 MED ORDER — DOCUSATE SODIUM 100 MG PO CAPS
100.0000 mg | ORAL_CAPSULE | Freq: Two times a day (BID) | ORAL | Status: DC
Start: 2017-06-25 — End: 2017-07-04
  Administered 2017-06-25 – 2017-07-04 (×17): 100 mg via ORAL
  Filled 2017-06-25 (×17): qty 1

## 2017-06-25 MED ORDER — ONDANSETRON HCL 4 MG PO TABS
4.0000 mg | ORAL_TABLET | Freq: Four times a day (QID) | ORAL | Status: DC | PRN
  Administered 2017-06-27: 4 mg via ORAL
  Filled 2017-06-25: qty 1

## 2017-06-25 MED ORDER — ISOSORBIDE MONONITRATE ER 60 MG PO TB24
120.0000 mg | ORAL_TABLET | Freq: Every day | ORAL | Status: DC
  Administered 2017-06-26 – 2017-07-04 (×9): 120 mg via ORAL
  Filled 2017-06-25 (×10): qty 2

## 2017-06-25 MED ORDER — INSULIN ASPART 100 UNIT/ML ~~LOC~~ SOLN
0.0000 [IU] | Freq: Three times a day (TID) | SUBCUTANEOUS | Status: DC
  Administered 2017-06-25: 5 [IU] via SUBCUTANEOUS
  Administered 2017-06-26: 3 [IU] via SUBCUTANEOUS
  Administered 2017-06-26: 1 [IU] via SUBCUTANEOUS
  Administered 2017-06-26: 5 [IU] via SUBCUTANEOUS
  Administered 2017-06-27 – 2017-06-28 (×3): 2 [IU] via SUBCUTANEOUS
  Administered 2017-06-29: 3 [IU] via SUBCUTANEOUS
  Administered 2017-06-30 (×2): 1 [IU] via SUBCUTANEOUS
  Administered 2017-06-30 – 2017-07-01 (×2): 2 [IU] via SUBCUTANEOUS
  Administered 2017-07-01: 5 [IU] via SUBCUTANEOUS
  Administered 2017-07-01: 1 [IU] via SUBCUTANEOUS
  Administered 2017-07-02 (×2): 3 [IU] via SUBCUTANEOUS
  Administered 2017-07-03 (×2): 1 [IU] via SUBCUTANEOUS
  Administered 2017-07-04: 5 [IU] via SUBCUTANEOUS
  Administered 2017-07-04: 3 [IU] via SUBCUTANEOUS
  Filled 2017-06-25: qty 1

## 2017-06-25 MED ORDER — ACETAMINOPHEN 650 MG RE SUPP: 650.0000 mg | Freq: Four times a day (QID) | RECTAL | Status: DC | PRN

## 2017-06-25 MED ORDER — CALCIUM CARBONATE ANTACID 1250 MG/5ML PO SUSP
500.0000 mg | Freq: Four times a day (QID) | ORAL | Status: DC | PRN
  Administered 2017-06-25: 500 mg via ORAL
  Filled 2017-06-25: qty 5

## 2017-06-25 MED ORDER — ASPIRIN 81 MG PO CHEW
81.0000 mg | CHEWABLE_TABLET | Freq: Every day | ORAL | Status: DC
  Administered 2017-06-26 – 2017-07-04 (×9): 81 mg via ORAL
  Filled 2017-06-25 (×9): qty 1

## 2017-06-25 NOTE — ED Notes (Signed)
ED TO INPATIENT HANDOFF REPORT  Name/Age/Gender Jeffery Young 52 y.o. male  Code Status    Code Status Orders  (From admission, onward)        Start     Ordered   06/25/17 1622  Full code  Continuous     06/25/17 1621    Code Status History    Date Active Date Inactive Code Status Order ID Comments User Context   05/28/2017 1938 06/09/2017 1601 Full Code [id number]  [name], MD Inpatient   01/11/2016 0019 01/12/2016 1639 Full Code [id number]  Edwin Dada, MD Inpatient   12/22/2015 1539 12/26/2015 1900 Full Code [id number]  Burnell Blanks, MD Inpatient   12/22/2015 1539 12/22/2015 1539 Full Code [id number]  Eileen Stanford, PA-C Inpatient      Home/SNF/Other Rehab  Chief Complaint Abnormal kidney function  Level of Care/Admitting Diagnosis ED Disposition    ED Disposition Condition [facility] Area: [place] [[id number]]  Level of Care: Telemetry [5]  Diagnosis: ESRD (end stage renal disease) on dialysis [facility]) [[id number]]  Admitting Physician: Karmen Bongo [[id number]]  Attending Physician: Karmen Bongo [[id number]]  Estimated length of stay: 3 - 4 days  Certification:: I certify this patient will need inpatient services for at least 2 midnights  PT Class (Do Not Modify): Inpatient [101]  PT Acc Code (Do Not Modify): Private [1]       Medical History Past Medical History:  Diagnosis Date  . Coronary artery disease    a.12/22/15: NSTEMI s/p overlapping DES x2 and balloon angioplasty to distal AV groove Circumflex (too small for a stent).   . Depression   . ESRD (end stage renal disease) on dialysis ([place])   . Hypercholesteremia   . Hypertension   . Prostate infection   . Tobacco abuse   . Type II diabetes mellitus (HCC)     Allergies No Known Allergies  IV Location/Drains/Wounds Patient Lines/Drains/Airways Status   Active Line/Drains/Airways    Name:   Placement date:   Placement time:   Site:   Days:   Peripheral IV 06/25/17 Right Hand   06/25/17    1649    Hand   less than 1          Labs/Imaging Results for orders placed or performed during the hospital encounter of 06/25/17 (from the past 48 hour(s))  Urinalysis, Routine w reflex microscopic     Status: Abnormal   Collection Time: 06/25/17  1:00 PM  Result Value Ref Range   Color, Urine YELLOW YELLOW   APPearance CLEAR CLEAR   Specific Gravity, Urine 1.009 1.005 - 1.030   pH 7.0 5.0 - 8.0   Glucose, UA >=500 (A) NEGATIVE mg/dL   Hgb urine dipstick NEGATIVE NEGATIVE   Bilirubin Urine NEGATIVE NEGATIVE   Ketones, ur NEGATIVE NEGATIVE mg/dL   Protein, ur 100 (A) NEGATIVE mg/dL   Nitrite NEGATIVE NEGATIVE   Leukocytes, UA NEGATIVE NEGATIVE   RBC / HPF 0-5 0 - 5 RBC/hpf   WBC, UA 0-5 0 - 5 WBC/hpf   Bacteria, UA NONE SEEN NONE SEEN    Comment: Performed at [facility], [address], [place], [place] [address]  Basic metabolic panel     Status: Abnormal   Collection Time: 06/25/17  1:21 PM  Result Value Ref Range   Sodium 131 (L) 135 - 145 mmol/L   Potassium 5.2 (H) 3.5 - 5.1 mmol/L   Chloride 90 (L) 101 -  111 mmol/L   CO2 28 22 - 32 mmol/L   Glucose, Bld 321 (H) 65 - 99 mg/dL   BUN 92 (H) 6 - 20 mg/dL   Creatinine, Ser 7.89 (H) 0.61 - 1.24 mg/dL   Calcium 9.1 8.9 - 10.3 mg/dL   GFR calc non Af Amer 7 (L) >60 mL/min   GFR calc Af Amer 8 (L) >60 mL/min    Comment: (NOTE) The eGFR has been calculated using the CKD EPI equation. This calculation has not been validated in all clinical situations. eGFR's persistently <60 mL/min signify possible Chronic Kidney Disease.    Anion gap 13 5 - 15    Comment: Performed at [facility] [address] [place], [place] [address]  CBC with Differential     Status: Abnormal   Collection Time: 06/25/17  1:21 PM  Result Value Ref Range   WBC 6.2 4.0 - 10.5 K/uL   RBC 3.55 (L) 4.22 - 5.81 MIL/uL   Hemoglobin 10.5 (L) 13.0 - 17.0 g/dL   HCT 32.9 (L) 39.0 - 52.0 %    MCV 92.7 78.0 - 100.0 fL   MCH 29.6 26.0 - 34.0 pg   MCHC 31.9 30.0 - 36.0 g/dL   RDW 12.9 11.5 - 15.5 %   Platelets 278 150 - 400 K/uL   Neutrophils Relative % 73 %   Neutro Abs 4.6 1.7 - 7.7 K/uL   Lymphocytes Relative 15 %   Lymphs Abs 0.9 0.7 - 4.0 K/uL   Monocytes Relative 8 %   Monocytes Absolute 0.5 0.1 - 1.0 K/uL   Eosinophils Relative 2 %   Eosinophils Absolute 0.1 0.0 - 0.7 K/uL   Basophils Relative 1 %   Basophils Absolute 0.0 0.0 - 0.1 K/uL   Immature Granulocytes 1 %   Abs Immature Granulocytes 0.0 0.0 - 0.1 K/uL    Comment: Performed at [facility] [address], [place], [place] [address]  Magnesium     Status: Abnormal   Collection Time: 06/25/17  1:21 PM  Result Value Ref Range   Magnesium 2.7 (H) 1.7 - 2.4 mg/dL    Comment: Performed at [facility] [address] [place], [place] [address]  Vitamin B12     Status: None   Collection Time: 06/25/17  4:50 PM  Result Value Ref Range   Vitamin B-12 524 180 - 914 pg/mL    Comment: (NOTE) This assay is not validated for testing neonatal or myeloproliferative syndrome specimens for Vitamin B12 levels. Performed at [facility], [place] [address], [place], [place] [address]   Folate     Status: None   Collection Time: 06/25/17  4:50 PM  Result Value Ref Range   Folate 23.5 >5.9 ng/mL    Comment: Performed at [facility], [place] [address] [place], [place] [address]  Iron and TIBC     Status: Abnormal   Collection Time: 06/25/17  4:50 PM  Result Value Ref Range   Iron 48 45 - 182 ug/dL   TIBC 228 (L) 250 - 450 ug/dL   Saturation Ratios 21 17.9 - 39.5 %   UIBC 180 ug/dL    Comment: Performed at [facility] [address] [place], [place] [address]  Ferritin     Status: None   Collection Time: 06/25/17  4:50 PM  Result Value Ref Range   Ferritin 155 24 - 336 ng/mL    Comment: Performed at [facility], [place] [address] [place], [place] [address]  Reticulocytes      Status: Abnormal   Collection Time: 06/25/17  4:50 PM  Result Value Ref Range   Retic Ct Pct 1.3 0.4 - 3.1 %   RBC. 3.47 (L) 4.22 - 5.81 MIL/uL   Retic Count, Absolute 45.1 19.0 - 186.0 K/uL    Comment: Performed at [facility] [address] [place], [place] [address]  CBG monitoring, ED     Status: Abnormal   Collection Time: 06/25/17  5:11 PM  Result Value Ref Range   Glucose-Capillary 287 (H) 65 - 99 mg/dL   No results found.  Pending Labs Unresulted Labs (From admission, onward)   Start     Ordered   06/26/17 0500  Renal function panel  Tomorrow morning,   R     06/25/17 1512   06/26/17 0500  CBC  Tomorrow morning,   R     06/25/17 1621      Vitals/Pain Today's Vitals   06/25/17 1601 06/25/17 1615 06/25/17 1623 06/25/17 1715  BP:  (!) 159/88    Pulse:  67  68  Resp:  19  20  Temp:      TempSrc:      SpO2: 94% 93%  91%  PainSc:   0-No pain     Isolation Precautions No active isolations  Medications Medications  amLODipine (NORVASC) tablet 10 mg (has no administration in time range)  aspirin chewable tablet 81 mg (has no administration in time range)  calcitRIOL (ROCALTROL) capsule 0.25 mcg (has no administration in time range)  carvedilol (COREG) tablet 25 mg (25 mg Oral Given 06/25/17 1744)  clopidogrel (PLAVIX) tablet 75 mg (has no administration in time range)  hydrALAZINE (APRESOLINE) tablet 100 mg (100 mg Oral Given 06/25/17 1748)  isosorbide mononitrate (IMDUR) 24 hr tablet 120 mg (has no administration in time range)  sertraline (ZOLOFT) tablet 50 mg (has no administration in time range)  simvastatin (ZOCOR) tablet 20 mg (has no administration in time range)  sodium bicarbonate tablet 1,300 mg (has no administration in time range)  traZODone (DESYREL) tablet 50 mg (has no administration in time range)  docusate sodium (COLACE) capsule 100 mg (has no administration in time range)  acetaminophen (TYLENOL) tablet 650 mg (has no administration in  time range)    Or  acetaminophen (TYLENOL) suppository 650 mg (has no administration in time range)  zolpidem (AMBIEN) tablet 5 mg (has no administration in time range)  sorbitol 70 % solution 30 mL (has no administration in time range)  docusate sodium (ENEMEEZ) enema 283 mg (has no administration in time range)  ondansetron (ZOFRAN) tablet 4 mg (has no administration in time range)    Or  ondansetron (ZOFRAN) injection 4 mg (has no administration in time range)  camphor-menthol (SARNA) lotion 1 application (has no administration in time range)    And  hydrOXYzine (ATARAX/VISTARIL) tablet 25 mg (has no administration in time range)  calcium carbonate (dosed in mg elemental calcium) suspension 500 mg of elemental calcium (500 mg of elemental calcium Oral Given 06/25/17 1744)  feeding supplement (NEPRO CARB STEADY) liquid 237 mL (has no administration in time range)  enoxaparin (LOVENOX) injection 30 mg (30 mg Subcutaneous Given 06/25/17 1743)  sodium chloride flush (NS) 0.9 % injection 3 mL (has no administration in time range)  insulin aspart (novoLOG) injection 0-9 Units (5 Units Subcutaneous Given 06/25/17 1743)  insulin aspart (novoLOG) injection 0-5 Units (has  no administration in time range)    Mobility walks with person assist

## 2017-06-25 NOTE — Consult Note (Addendum)
Hospital Consult    Reason for Consult:  Need for permanent access and [facility] Requesting Physician:  Dr. Lorrene Reid MRN #:  [id number]  History of Present Illness: This is a 52 y.o. male with PMH significant for HTN, hyperlipidemia, CAD with coronary stenting 12/2015 maintained on plavix, type II DM, and CKD stage V brought to the ED from SNF due to symptoms of uremia.  Nephrology has consulted vascular surgery for non-emergent [facility] placement and permanent access creation.  The patient is R arm dominant and would prefer access placement in L arm.  He is not on any blood thinners.  Patient understands need for dialysis and is willing to proceed.  Past Medical History:  Diagnosis Date  . Coronary artery disease    a.12/22/15: NSTEMI s/p overlapping DES x2 and balloon angioplasty to distal AV groove Circumflex (too small for a stent).   . Depression   . Hypercholesteremia   . Hypertension   . Prostate infection   . Tobacco abuse   . Type II diabetes mellitus ([place])     Past Surgical History:  Procedure Laterality Date  . CARDIAC CATHETERIZATION N/A 12/22/2015   Procedure: Left Heart Cath and Coronary Angiography;  Surgeon: Burnell Blanks, MD;  Location: [place] CV LAB;  Service: Cardiovascular;  Laterality: N/A;  . CARDIAC CATHETERIZATION N/A 12/22/2015   Procedure: Coronary Stent Intervention;  Surgeon: Burnell Blanks, MD;  Location: [place] CV LAB;  Service: Cardiovascular;  Laterality: N/A;  . CARDIAC CATHETERIZATION N/A 01/11/2016   Procedure: Left Heart Cath and Coronary Angiography;  Surgeon: Sherren Mocha, MD;  Location: [place] CV LAB;  Service: Cardiovascular;  Laterality: N/A;  . CARDIAC CATHETERIZATION N/A 01/11/2016   Procedure: Intravascular Pressure Wire/FFR Study;  Surgeon: Sherren Mocha, MD;  Location: [place] CV LAB;  Service: Cardiovascular;  Laterality: N/A;  . CARDIAC CATHETERIZATION N/A 01/11/2016   Procedure: Coronary Stent Intervention;   Surgeon: Sherren Mocha, MD;  Location: [place] CV LAB;  Service: Cardiovascular;  Laterality: N/A;  . CORONARY ANGIOPLASTY WITH STENT PLACEMENT  12/22/2015    No Known Allergies  Prior to Admission medications   Medication Sig Start Date End Date Taking? Authorizing Provider  amLODipine (NORVASC) 10 MG tablet Take 1 tablet (10 mg total) by mouth at bedtime. 06/07/17   Patrecia Pour, MD  aspirin 81 MG chewable tablet Chew 1 tablet (81 mg total) by mouth daily. Patient not taking: Reported on 05/28/2017 01/13/16   Lyda Jester M, PA-C  calcitRIOL (ROCALTROL) 0.25 MCG capsule Take 1 capsule (0.25 mcg total) by mouth daily. 06/08/17   Patrecia Pour, MD  carvedilol (COREG) 25 MG tablet Take 1 tablet (25 mg total) by mouth 2 (two) times daily with a meal. Patient not taking: Reported on 05/28/2017 12/26/15   Reino Bellis B, NP  clopidogrel (PLAVIX) 75 MG tablet TAKE ONE TABLET BY MOUTH DAILY Patient not taking: Reported on 05/28/2017 04/10/17   Burnell Blanks, MD  furosemide (LASIX) 80 MG tablet Take 1 tablet (80 mg total) by mouth daily. 06/08/17   Patrecia Pour, MD  hydrALAZINE (APRESOLINE) 100 MG tablet Take 1 tablet (100 mg total) by mouth 3 (three) times daily. 06/07/17 09/05/17  Patrecia Pour, MD  isosorbide mononitrate (IMDUR) 120 MG 24 hr tablet Take 1 tablet (120 mg total) by mouth daily. 06/08/17   Patrecia Pour, MD  Multiple Vitamins-Minerals (MULTIVITAMIN ADULT PO) Take 1 tablet by mouth daily.    [provider]  nitroGLYCERIN (  NITROSTAT) 0.4 MG SL tablet Place 1 tablet (0.4 mg total) under the tongue every 5 (five) minutes x 3 doses as needed for chest pain. 12/26/15   Cheryln Manly, NP  sertraline (ZOLOFT) 50 MG tablet Take 1 tablet (50 mg total) by mouth daily. 06/08/17   Patrecia Pour, MD  simvastatin (ZOCOR) 20 MG tablet Take 20 mg by mouth daily.    [provider]  sodium bicarbonate 650 MG tablet Take 2 tablets (1,300 mg total) by mouth 2 (two) times  daily. 06/07/17   Patrecia Pour, MD  traZODone (DESYREL) 50 MG tablet Take 1 tablet (50 mg total) by mouth at bedtime. 06/09/17   Patrecia Pour, MD    Social History   Socioeconomic History  . Marital status: Married    Spouse name: Not on file  . Number of children: Not on file  . Years of education: Not on file  . Highest education level: Not on file  Occupational History  . Occupation: International aid/development worker: [employer]  Social Needs  . Financial resource strain: Not on file  . Food insecurity:    Worry: Not on file    Inability: Not on file  . Transportation needs:    Medical: Not on file    Non-medical: Not on file  Tobacco Use  . Smoking status: Current Every Day Smoker    Packs/day: 0.75    Years: 29.00    Pack years: 21.75    Types: Cigarettes  . Smokeless tobacco: Never Used  Substance and Sexual Activity  . Alcohol use: Yes    Alcohol/week: 4.2 oz    Types: 7 Cans of beer per week  . Drug use: No  . Sexual activity: Not Currently  Lifestyle  . Physical activity:    Days per week: Not on file    Minutes per session: Not on file  . Stress: Not on file  Relationships  . Social connections:    Talks on phone: Not on file    Gets together: Not on file    Attends religious service: Not on file    Active member of club or organization: Not on file    Attends meetings of clubs or organizations: Not on file    Relationship status: Not on file  . Intimate partner violence:    Fear of current or ex partner: Not on file    Emotionally abused: Not on file    Physically abused: Not on file    Forced sexual activity: Not on file  Other Topics Concern  . Not on file  Social History Narrative  . Not on file     Family History  Problem Relation Age of Onset  . CAD Brother   . CVA Brother   . CAD Brother     ROS: Otherwise negative unless mentioned in HPI  Physical Examination  Vitals:   06/25/17 1345 06/25/17 1445  BP: 140/70 (!) 145/75  Pulse: 66  72  Resp: 16 18  Temp:    SpO2: 92% 91%   There is no height or weight on file to calculate BMI.  General:  WDWN in NAD Gait: Not observed HENT: WNL, normocephalic Pulmonary: normal non-labored breathing Cardiac: RRR Abdomen:  soft, NT/ND, no masses Skin: without rashes Vascular Exam/Pulses: symmetrical radial pulses, symmetrical DP pulses Extremities: without ischemic changes, without Gangrene , without cellulitis; without open wounds;  Musculoskeletal: moving all extremities well; IV noted in L  arm near [facility] fossa Neurologic: A&O X 3;  No focal weakness or paresthesias are detected; speech is fluent/normal Psychiatric:  The pt has Normal affect. Lymph:  Unremarkable  CBC    Component Value Date/Time   WBC 6.2 06/25/2017 1321   RBC 3.55 (L) 06/25/2017 1321   HGB 10.5 (L) 06/25/2017 1321   HCT 32.9 (L) 06/25/2017 1321   PLT 278 06/25/2017 1321   MCV 92.7 06/25/2017 1321   MCH 29.6 06/25/2017 1321   MCHC 31.9 06/25/2017 1321   RDW 12.9 06/25/2017 1321   LYMPHSABS 0.9 06/25/2017 1321   MONOABS 0.5 06/25/2017 1321   EOSABS 0.1 06/25/2017 1321   BASOSABS 0.0 06/25/2017 1321    BMET    Component Value Date/Time   NA 131 (L) 06/25/2017 1321   K 5.2 (H) 06/25/2017 1321   CL 90 (L) 06/25/2017 1321   CO2 28 06/25/2017 1321   GLUCOSE 321 (H) 06/25/2017 1321   BUN 92 (H) 06/25/2017 1321   CREATININE 7.89 (H) 06/25/2017 1321   CREATININE 1.61 (H) 01/03/2016 1236   CALCIUM 9.1 06/25/2017 1321   CALCIUM 8.3 (L) 05/31/2017 0954   GFRNONAA 7 (L) 06/25/2017 1321   GFRAA 8 (L) 06/25/2017 1321    COAGS: Lab Results  Component Value Date   INR 1.06 01/11/2016   INR 0.92 12/23/2015   INR 1.02 12/22/2015     Non-Invasive Vascular Imaging:   Right  Upper Extremity Vein Map       Incidental finding: a complex well-defined non-vascular structure on right shoulder.    Cephalic  Segment Diameter Depth Comment  1. Axilla mm mm Not visualized  2. Mid upper arm mm mm Not  visualized  3. Above AC 3.8mm 10.80mm   4. In AC mm mm Not visualized  5. Below AC mm mm Not visualized  6. Mid forearm mm mm Not visualized  7. Wrist mm mm    mm mm    mm mm    mm mm    Basilic  Segment Diameter Depth Comment  1. Axilla 5.47mm 4.35mm   2. Mid upper arm 4.74mm 3.62mm   3. Above AC 5.52mm 7.3mm   4. In [facility] 4.6mm 6.25mm Braching  5. Below AC 3.21mm 2.83mm   6. Mid forearm 3.56mm 1.20mm Thrombosed  7. Wrist mm mm    mm mm    mm mm    mm mm    Left  Upper Extremity Vein Map   Cephalic  Segment Diameter Depth Comment  1. Axilla 2.26mm 3.15mm   2. Mid upper arm 2.54mm 2.98mm   3. Above AC 2.59mm 2.58mm Braching  4. In AC mm mm Thrombosed and Braching  5. Below AC mm mm Not visualized  6. Mid forearm mm mm Not visualized  7. Wrist mm mm Not visualized   mm mm    mm mm    mm mm    Basilic  Segment Diameter Depth Comment  1. Axilla 5.21mm 6.46mm   2. Mid upper arm 4.44mm 10.53mm   3. Above [facility] 4.28mm 10.43mm   4. In [facility] 3.88mm 5.50mm Braching  5. Below AC 2.70mm 5.59mm Braching  6. Mid forearm mm mm Not visualized  7. Wrist mm mm Not visualized   mm mm    mm mm    mm mm     Statin:  Yes.   Beta Blocker:  Yes.   Aspirin:  Yes.   ACEI:  No. ARB:  No. CCB use:  Yes Other antiplatelets/anticoagulants:  Yes.   plavix for CAD with history of PCI and stent x2 12/2015   ASSESSMENT/PLAN: This is a 52 y.o. male with progression of CKD to ESRD  Patient would prefer dialysis access in L arm Vein mapping performed 05/25/17; adequate L basilic vein, possibly cephalic vein Plan will be for [facility] placement and L arm AV fistula vs graft this week Move IV and restrict L arm Dr. Scot Dock will see the patient later today Ok to give diet from vascular surgery standpoint  Dagoberto Ligas PA-C [facility] [phone]  I have interviewed the patient and examined the patient. I agree with the  findings by the PA. He has an IV in his left antecubital space which will have to be moved to the right arm.  The left upper arm cephalic vein has thrombus at the antecubital level.  There is a small chance that the vein would be usable above that.  The second choice would be a basilic vein transposition on the left.  I have discussed that this could potentially have to be done in 2 stages depending upon the size of the vein.  The third option would be placement of an AV graft.  We will also place a tunneled dialysis catheter.  His surgery has been scheduled for Friday.  Deitra Mayo, MD, [place] [phone] Office: [place], [place] [phone]

## 2017-06-25 NOTE — ED Provider Notes (Signed)
Emergency Department Provider Note   I have reviewed the triage vital signs and the nursing notes.   HISTORY  Chief Complaint No chief complaint on file.   HPI Jeffery Young is a 52 y.o. male with PMH of CAD, HLD, HTN, DM, and CKD presents to the emergency department from his SNF with concern for abnormal kidney function.  The patient was admitted on 05/28/17 with HTN and AKI.  He was ultimately discharged on 06/07/17 with plan for continued nephrology follow-up.  Patient states he has had no symptoms that he can recall specifically since leaving the hospital.  He states he continues to make urine.  Denies any blood in the urine.  No pain in the abdomen, back, chest.  No new medications that he can recall.    Past Medical History:  Diagnosis Date  . Coronary artery disease    a.12/22/15: NSTEMI s/p overlapping DES x2 and balloon angioplasty to distal AV groove Circumflex (too small for a stent).   . Depression   . ESRD (end stage renal disease) on dialysis ([place])   . Hypercholesteremia   . Hypertension   . Prostate infection   . Tobacco abuse   . Type II diabetes mellitus [facility])     Patient Active Problem List   Diagnosis Date Noted  . ESRD (end stage renal disease) on dialysis ([place]) 06/25/2017  . Uncontrolled hypertension 05/28/2017  . Depression 05/28/2017  . Normocytic anemia 05/28/2017  . History of completed stroke 05/28/2017  . Syncope 03/13/2016  . Ischemic cardiomyopathy 03/13/2016  . Unstable angina pectoris ([place]) 01/11/2016  . Hypertensive heart disease with heart failure ([place])   . Chronic combined systolic and diastolic heart failure ([place])   . Elevated troponin   . Chest pain 01/10/2016  . Coronary artery disease due to lipid rich plaque 01/10/2016  . Status post coronary artery stent placement   . Type 2 diabetes mellitus with diabetic nephropathy, without long-term current use of insulin ([place])   . Tobacco abuse   . Essential hypertension   .  Hypercholesteremia   . NSTEMI (non-ST elevated myocardial infarction) ([place]) 12/22/2015  . Lightheadedness 08/22/2012  . Dehydration 08/22/2012    Past Surgical History:  Procedure Laterality Date  . CARDIAC CATHETERIZATION N/A 12/22/2015   Procedure: Left Heart Cath and Coronary Angiography;  Surgeon: Burnell Blanks, MD;  Location: [place] CV LAB;  Service: Cardiovascular;  Laterality: N/A;  . CARDIAC CATHETERIZATION N/A 12/22/2015   Procedure: Coronary Stent Intervention;  Surgeon: Burnell Blanks, MD;  Location: [place] CV LAB;  Service: Cardiovascular;  Laterality: N/A;  . CARDIAC CATHETERIZATION N/A 01/11/2016   Procedure: Left Heart Cath and Coronary Angiography;  Surgeon: Sherren Mocha, MD;  Location: [place] [facility];  Service: Cardiovascular;  Laterality: N/A;  . CARDIAC CATHETERIZATION N/A 01/11/2016   Procedure: Intravascular Pressure Wire/FFR Study;  Surgeon: Sherren Mocha, MD;  Location: [place] CV LAB;  Service: Cardiovascular;  Laterality: N/A;  . CARDIAC CATHETERIZATION N/A 01/11/2016   Procedure: Coronary Stent Intervention;  Surgeon: Sherren Mocha, MD;  Location: [place] CV LAB;  Service: Cardiovascular;  Laterality: N/A;  . CORONARY ANGIOPLASTY WITH STENT PLACEMENT  12/22/2015    Current Outpatient Rx  . Order #: [id number] Class: No Print  . Order #: [id number] Class: No Print  . Order #: [id number] Class: No Print  . Order #: [id number] Class: Normal  . Order #: [id number] Class: Normal  . Order #: [id number] Class: No Print  . Order #: [id number] Class: No Print  .  Order #: [id number] Class: No Print  . Order #: [id number] Class: Historical Med  . Order #: [id number] Class: Normal  . Order #: [id number] Class: No Print  . Order #: [id number] Class: Historical Med  . Order #: [id number] Class: No Print  . Order #: [id number] Class: No Print    Allergies Patient has no known allergies.  Family History  Problem Relation Age of Onset  . CAD Brother     . CVA Brother   . Chronic Renal Failure Brother 50       on HD  . CAD Brother     Social History Social History   Tobacco Use  . Smoking status: Former Smoker    Packs/day: 0.75    Years: 29.00    Pack years: 21.75    Types: Cigarettes    Last attempt to quit: 05/28/2017    Years since quitting: 0.0  . Smokeless tobacco: Never Used  Substance Use Topics  . Alcohol use: Not Currently    Alcohol/week: 4.2 oz    Types: 7 Cans of beer per week    Frequency: Never    Comment: last drink was prior to last hospitalization  . Drug use: No    Review of Systems  Constitutional: No fever/chills Eyes: No visual changes. ENT: No sore throat. Cardiovascular: Denies chest pain. Respiratory: Denies shortness of breath. Gastrointestinal: No abdominal pain.  No nausea, no vomiting.  No diarrhea.  No constipation. Genitourinary: Negative for dysuria. Musculoskeletal: Negative for back pain. Skin: Negative for rash. Neurological: Negative for headaches, focal weakness or numbness.  10-point ROS otherwise negative.  ____________________________________________   PHYSICAL EXAM:  VITAL SIGNS: ED Triage Vitals  Enc Vitals Group     BP 06/25/17 1235 129/70     Pulse Rate 06/25/17 1235 62     Resp 06/25/17 1235 16     Temp 06/25/17 1235 98.2 F (36.8 C)     Temp Source 06/25/17 1235 Oral     SpO2 06/25/17 1235 93 %     Pain Score 06/25/17 1233 0   Constitutional: Alert and oriented. Well appearing and in no acute distress. Eyes: Conjunctivae are normal.  Head: Atraumatic. Nose: No congestion/rhinnorhea. Mouth/Throat: Mucous membranes are moist.  Oropharynx non-erythematous. Neck: No stridor.  Cardiovascular: Normal rate, regular rhythm. Good peripheral circulation. Grossly normal heart sounds.   Respiratory: Normal respiratory effort.  No retractions. Lungs CTAB. Gastrointestinal: Soft and nontender. No distention.  Musculoskeletal: No lower extremity tenderness nor edema.  No gross deformities of extremities. Neurologic:  Normal speech and language. No gross focal neurologic deficits are appreciated.  Skin:  Skin is warm, dry and intact. No rash noted. Psychiatric: Mood and affect are flat. Speech and behavior are normal.  ____________________________________________   LABS (all labs ordered are listed, but only abnormal results are displayed)  Labs Reviewed  BASIC METABOLIC PANEL - Abnormal; Notable for the following components:      Result Value   Sodium 131 (*)    Potassium 5.2 (*)    Chloride 90 (*)    Glucose, Bld 321 (*)    BUN 92 (*)    Creatinine, Ser 7.89 (*)    GFR calc non Af Amer 7 (*)    GFR calc Af Amer 8 (*)    All other components within normal limits  CBC WITH DIFFERENTIAL/PLATELET - Abnormal; Notable for the following components:   RBC 3.55 (*)    Hemoglobin 10.5 (*)    HCT 32.9 (*)    All other components within  normal limits  MAGNESIUM - Abnormal; Notable for the following components:   Magnesium 2.7 (*)    All other components within normal limits  URINALYSIS, ROUTINE W REFLEX MICROSCOPIC - Abnormal; Notable for the following components:   Glucose, UA >=500 (*)    Protein, ur 100 (*)    All other components within normal limits  VITAMIN B12  FOLATE  IRON AND TIBC  FERRITIN  RETICULOCYTES   ____________________________________________  EKG   EKG Interpretation  Date/Time:  Tuesday Jun 25 2017 12:55:53 EDT Ventricular Rate:  65 PR Interval:    QRS Duration: 89 QT Interval:  420 QTC Calculation: 437 R Axis:   74 Text Interpretation:  Sinus rhythm Anterior infarct, old ST elevation suggests acute pericarditis Baseline wander in lead(s) II III aVF No STEMI.  Confirmed by Nanda Quinton [phone]) on 06/25/2017 1:05:50 PM       ____________________________________________   PROCEDURES  Procedure(s) performed:   Procedures  None ____________________________________________   INITIAL IMPRESSION / ASSESSMENT  AND PLAN / ED COURSE  Pertinent labs & imaging results that were available during my care of the patient were reviewed by me and considered in my medical decision making (see chart for details).  She presents to the emergency department for evaluation of worsening kidney function.  Plan to repeat labs for comparison to prior studies.  Patient with no symptoms at this time.  He was able to produce a large volume of urine on arrival.  Nonfocal exam. EKG without acute abnormality.   02:35 PM Spoke with Nephrology regarding the patient. They were actually the ones who sent the patient to the ED and plan to start HD while inpatient. Will admit to the hospitalist.   Discussed patient's case with Hospitalist to request admission. Patient and family (if present) updated with plan. Care transferred to Hospitalist service.  I reviewed all nursing notes, vitals, pertinent old records, EKGs, labs, imaging (as available).  ____________________________________________  FINAL CLINICAL IMPRESSION(S) / ED DIAGNOSES  Final diagnoses:  ESRD (end stage renal disease) ([place])     MEDICATIONS GIVEN DURING THIS VISIT:  Medications  amLODipine (NORVASC) tablet 10 mg (has no administration in time range)  aspirin chewable tablet 81 mg (has no administration in time range)  calcitRIOL (ROCALTROL) capsule 0.25 mcg (has no administration in time range)  carvedilol (COREG) tablet 25 mg (has no administration in time range)  clopidogrel (PLAVIX) tablet 75 mg (has no administration in time range)  hydrALAZINE (APRESOLINE) tablet 100 mg (has no administration in time range)  isosorbide mononitrate (IMDUR) 24 hr tablet 120 mg (has no administration in time range)  sertraline (ZOLOFT) tablet 50 mg (has no administration in time range)  simvastatin (ZOCOR) tablet 20 mg (has no administration in time range)  sodium bicarbonate tablet 1,300 mg (has no administration in time range)  traZODone (DESYREL) tablet 50 mg (has  no administration in time range)  docusate sodium (COLACE) capsule 100 mg (has no administration in time range)  acetaminophen (TYLENOL) tablet 650 mg (has no administration in time range)    Or  acetaminophen (TYLENOL) suppository 650 mg (has no administration in time range)  zolpidem (AMBIEN) tablet 5 mg (has no administration in time range)  sorbitol 70 % solution 30 mL (has no administration in time range)  docusate sodium (ENEMEEZ) enema 283 mg (has no administration in time range)  ondansetron (ZOFRAN) tablet 4 mg (has no administration in time range)    Or  ondansetron (ZOFRAN) injection 4 mg (has no  administration in time range)  camphor-menthol (SARNA) lotion 1 application (has no administration in time range)    And  hydrOXYzine (ATARAX/VISTARIL) tablet 25 mg (has no administration in time range)  calcium carbonate (dosed in mg elemental calcium) suspension 500 mg of elemental calcium (has no administration in time range)  feeding supplement (NEPRO CARB STEADY) liquid 237 mL (has no administration in time range)  enoxaparin (LOVENOX) injection 30 mg (has no administration in time range)  sodium chloride flush (NS) 0.9 % injection 3 mL (has no administration in time range)  insulin aspart (novoLOG) injection 0-9 Units (has no administration in time range)  insulin aspart (novoLOG) injection 0-5 Units (has no administration in time range)    Note:  This document was prepared using Dragon voice recognition software and may include unintentional dictation errors.  Nanda Quinton, MD Emergency Medicine    Long, Wonda Olds, MD 06/25/17 [phone]

## 2017-06-25 NOTE — H&P (Signed)
History and Physical    Jeffery Young RSW:[id number] DOB: 06/14/1965 DOA: 06/25/2017  PCP: [name], MD Consultants:  Conni Slipper - nephrolgoy Patient coming from: [facility] ; [facility]: Wife, daughter; [phone]; [phone]  Chief Complaint:  weakness  HPI: Jeffery Young is a 52 y.o. male with medical history significant of DM; HTN; HLD; and CAD presenting with progressive renal failure.   He had several ministrokes.  It led him to check into [facility] prior.  Meanwhile, he has had progressive renal failure.  He also notes that last year he had a heart attack.  He does still make urine - voids about 3-4 times daily.  This time, he was having ongoing fatigue and he saw nephrology; they suggested he come in to start HD.  He was previously hospitalized from 4/23-5/5 for acute renal failure on CKD with progression to Stage V CKD.  He has had vein mapping performed and was planning to have access placement as an outpatient.  He has a h/o CVA with residual deficits but also has neurovegetative symptoms associated with depression which complicate his course.    ED Course:   CKD at a SNF.  Admitted earlier this month for acute on chronic renal failure, trying to avoid HD.  He was sent in for uremia, needs to start HD.  Some electrolyte abnormalities.  Review of Systems: As per HPI; otherwise review of systems reviewed and negative.  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  Ambulatory Status:  Currently in rehab and using a wheelchair due to falls  Past Medical History:  Diagnosis Date  . Coronary artery disease    a.12/22/15: NSTEMI s/p overlapping DES x2 and balloon angioplasty to distal AV groove Circumflex (too small for a stent).   . Depression   . ESRD (end stage renal disease) on dialysis ([place])   . Hypercholesteremia   . Hypertension   . Prostate infection   . Tobacco abuse   . Type II diabetes mellitus ([place])     Past Surgical History:  Procedure Laterality Date  . CARDIAC  CATHETERIZATION N/A 12/22/2015   Procedure: Left Heart Cath and Coronary Angiography;  Surgeon: Burnell Blanks, MD;  Location: [place] CV LAB;  Service: Cardiovascular;  Laterality: N/A;  . CARDIAC CATHETERIZATION N/A 12/22/2015   Procedure: Coronary Stent Intervention;  Surgeon: Burnell Blanks, MD;  Location: [place] CV LAB;  Service: Cardiovascular;  Laterality: N/A;  . CARDIAC CATHETERIZATION N/A 01/11/2016   Procedure: Left Heart Cath and Coronary Angiography;  Surgeon: Sherren Mocha, MD;  Location: [place] CV LAB;  Service: Cardiovascular;  Laterality: N/A;  . CARDIAC CATHETERIZATION N/A 01/11/2016   Procedure: Intravascular Pressure Wire/FFR Study;  Surgeon: Sherren Mocha, MD;  Location: [place] CV LAB;  Service: Cardiovascular;  Laterality: N/A;  . CARDIAC CATHETERIZATION N/A 01/11/2016   Procedure: Coronary Stent Intervention;  Surgeon: Sherren Mocha, MD;  Location: [place] CV LAB;  Service: Cardiovascular;  Laterality: N/A;  . CORONARY ANGIOPLASTY WITH STENT PLACEMENT  12/22/2015    Social History   Socioeconomic History  . Marital status: Married    Spouse name: Not on file  . Number of children: Not on file  . Years of education: Not on file  . Highest education level: Not on file  Occupational History  . Occupation: International aid/development worker: [employer]  Social Needs  . Financial resource strain: Not on file  . Food insecurity:    Worry: Not on file  Inability: Not on file  . Transportation needs:    Medical: Not on file    Non-medical: Not on file  Tobacco Use  . Smoking status: Former Smoker    Packs/day: 0.75    Years: 29.00    Pack years: 21.75    Types: Cigarettes    Last attempt to quit: 05/28/2017    Years since quitting: 0.0  . Smokeless tobacco: Never Used  Substance and Sexual Activity  . Alcohol use: Not Currently    Alcohol/week: 4.2 oz    Types: 7 Cans of beer per week    Frequency: Never    Comment: last  drink was prior to last hospitalization  . Drug use: No  . Sexual activity: Not Currently  Lifestyle  . Physical activity:    Days per week: Not on file    Minutes per session: Not on file  . Stress: Not on file  Relationships  . Social connections:    Talks on phone: Not on file    Gets together: Not on file    Attends religious service: Not on file    Active member of club or organization: Not on file    Attends meetings of clubs or organizations: Not on file    Relationship status: Not on file  . Intimate partner violence:    Fear of current or ex partner: Not on file    Emotionally abused: Not on file    Physically abused: Not on file    Forced sexual activity: Not on file  Other Topics Concern  . Not on file  Social History Narrative  . Not on file    No Known Allergies  Family History  Problem Relation Age of Onset  . CAD Brother   . CVA Brother   . Chronic Renal Failure Brother 50       on HD  . CAD Brother     Prior to Admission medications   Medication Sig Start Date End Date Taking? Authorizing Provider  amLODipine (NORVASC) 10 MG tablet Take 1 tablet (10 mg total) by mouth at bedtime. 06/07/17   Patrecia Pour, MD  aspirin 81 MG chewable tablet Chew 1 tablet (81 mg total) by mouth daily. Patient not taking: Reported on 05/28/2017 01/13/16   Lyda Jester M, PA-C  calcitRIOL (ROCALTROL) 0.25 MCG capsule Take 1 capsule (0.25 mcg total) by mouth daily. 06/08/17   Patrecia Pour, MD  carvedilol (COREG) 25 MG tablet Take 1 tablet (25 mg total) by mouth 2 (two) times daily with a meal. Patient not taking: Reported on 05/28/2017 12/26/15   Reino Bellis B, NP  clopidogrel (PLAVIX) 75 MG tablet TAKE ONE TABLET BY MOUTH DAILY Patient not taking: Reported on 05/28/2017 04/10/17   Burnell Blanks, MD  furosemide (LASIX) 80 MG tablet Take 1 tablet (80 mg total) by mouth daily. 06/08/17   Patrecia Pour, MD  hydrALAZINE (APRESOLINE) 100 MG tablet Take 1 tablet (100 mg  total) by mouth 3 (three) times daily. 06/07/17 09/05/17  Patrecia Pour, MD  isosorbide mononitrate (IMDUR) 120 MG 24 hr tablet Take 1 tablet (120 mg total) by mouth daily. 06/08/17   Patrecia Pour, MD  Multiple Vitamins-Minerals (MULTIVITAMIN ADULT PO) Take 1 tablet by mouth daily.    [provider]  nitroGLYCERIN (NITROSTAT) 0.4 MG SL tablet Place 1 tablet (0.4 mg total) under the tongue every 5 (five) minutes x 3 doses as needed for chest pain. 12/26/15   Mancel Bale,  Rosalene Billings, NP  sertraline (ZOLOFT) 50 MG tablet Take 1 tablet (50 mg total) by mouth daily. 06/08/17   Patrecia Pour, MD  simvastatin (ZOCOR) 20 MG tablet Take 20 mg by mouth daily.    [provider]  sodium bicarbonate 650 MG tablet Take 2 tablets (1,300 mg total) by mouth 2 (two) times daily. 06/07/17   Patrecia Pour, MD  traZODone (DESYREL) 50 MG tablet Take 1 tablet (50 mg total) by mouth at bedtime. 06/09/17   Patrecia Pour, MD    Physical Exam: Vitals:   06/25/17 1235 06/25/17 1345 06/25/17 1445  BP: 129/70 140/70 (!) 145/75  Pulse: 62 66 72  Resp: 16 16 18   Temp: 98.2 F (36.8 C)    TempSrc: Oral    SpO2: 93% 92% 91%     General:  Appears calm and comfortable and is NAD Eyes:  PERRL, EOMI, normal lids, iris ENT:  grossly normal hearing, lips & tongue, mmm Neck:  no LAD, masses or thyromegaly; no carotid bruits Cardiovascular:  RRR, no m/r/g. No LE edema.  Respiratory:   CTA bilaterally with no wheezes/rales/rhonchi.  Normal respiratory effort. Abdomen:  soft, NT, ND, NABS Skin:  no rash or induration seen on limited exam Musculoskeletal:  grossly normal tone BUE/BLE, good ROM, no bony abnormality Psychiatric:  Very flat mood and affect, speech slowed and somewhat dysathric Neurologic: CN 2-12 grossly intact other than mild left facial droop, moves all extremities in coordinated fashion but with decreased strength of the left lower > upper extermity   Radiological Exams on Admission: No results  found.  EKG: Independently reviewed.  NSR with rate 65; some peaked T waves with no evidence of acute ischemia   Labs on Admission: I have personally reviewed the available labs and imaging studies at the time of the admission.  Pertinent labs:   Na++ 131 K+ 5.2 Glucose 321 BUN 92/Creatinine 7.89/GFR 8; 63/6.32/11 on 5/5 Magnesium 2.7 WBC 6.2 Hgb 10.5; 9.2 on 4/28 UA: >500 glucose, 100 protein  Assessment/Plan Principal Problem:   ESRD (end stage renal disease) on dialysis [facility] [place]) Active Problems:   Type 2 diabetes mellitus with diabetic nephropathy, without long-term current use of insulin ([facility])   Essential hypertension   Hypercholesteremia   Chronic combined systolic and diastolic heart failure ([facility])   Depression   Normocytic anemia   History of completed stroke   ESRD -Patient with progressive renal disease, now in need of initiation of HD -He has been seen by nephrology and they are coordinating initiation of HD -Will admit and continue to monitor -Telemetry x 12 hours due to mild hyperkalemia in the setting of progressive renal failure -Will need to be clipped for outpatient HD -Nephrology prn order set utilized -Continue NaHCO3 until HD starts -Continue calcitriol -Vascular has consulted - plan is for [facility] placement and L arm fistula vs. Graft this week  DM -A1c done recently with good control off medications -Cover with sensitive-scale SSI  HTN -Continue Coreg, hydralazine  H/o CVA/CAD -He has residual deficits and is in SNF rehab; he will need to return there after discharge, but he requests a different facility (SW consult requested) -Continue ASA, Plavix -Continue Imdur -He will likely need PT/OT consults while inpatient to not lose the progress he has made so far  Depression -This has been interfering with his progress -Psych has been involved -Currently on Zoloft 50 mg, but it may be reasonable to increase to 100 mg daily -Also on trazodone  qhs  Chronic CHF -Most recent echo was in 1/18 and showed preserved EF and grade 1 diastolic dysfunction -He appears to be compensated at this time  Anemia -Appears to be stable -Will follow -Nephrology recommends Feraheme - will defer to nephrology and/or day team -They also request anemia panel, will order  DVT prophylaxis: Lovenox  Code Status:  Full - confirmed with patient/family Family Communication: Son present throughout evaluation  Disposition Plan:  SNF Consults called: Nephrology Admission status: Admit - It is my clinical opinion that admission to INPATIENT is reasonable and necessary because of the expectation that this patient will require hospital care that crosses at least 2 midnights to treat this condition based on the medical complexity of the problems presented.  Given the aforementioned information, the predictability of an adverse outcome is felt to be significant.    Karmen Bongo MD [facility]  If note is complete, please contact covering daytime or nighttime physician. [email or web address] Password [facility]  06/25/2017, 3:58 PM

## 2017-06-25 NOTE — Consult Note (Signed)
[facility] Consultation Note Requesting Physician:  EDP Primary Nephrologist: Dr. Carolin Sicks Reason for Consult:  New ESRD  HPI:  The patient is a 52 y.o. year-old Hispanic male, PMH HTN, HLD, DM2, strokes (CT 05/2017 old lacunes), CAD prior PTCA. Was here 4/23-06/09/17 for weakness, neurovegetative sx. Discharged to SNF d/t inability to care for self or live alone. Is residing at [facility].  Initial renal eval done 05/2017 for creatinine of 5.8. All serologies were negative. 4.3 gm proteinuria. Had been taking NSAIDS so hope was for some possible recovery.   Seen by Dr. Carolin Sicks at our office 06/21/17 for F/U of advanced CKD. Creatinine was up to 7.54, BUN 95, K 5.4, anemic. [facility] was notified and sent pt here for evaluation. He will require initiation of HD.  Does not have access in place at this time.Currently says feels "OK". No nausea, swelling or SOB. Of note he had a twin with ESRD who was on HD.  Vein mapping was done last admission (06/04/17)  Creatinine summary for Reference Creatinine, Ser  Date/Time Value Ref Range Status  06/25/2017 01:21 PM 7.89 (H) 0.61 - 1.24 mg/dL Final  06/09/2017 02:42 AM 6.32 (H) 0.61 - 1.24 mg/dL Final  06/08/2017 03:20 AM 5.95 (H) 0.61 - 1.24 mg/dL Final  06/07/2017 03:24 AM 5.86 (H) 0.61 - 1.24 mg/dL Final  06/06/2017 02:29 AM 5.70 (H) 0.61 - 1.24 mg/dL Final  06/05/2017 02:02 AM 5.32 (H) 0.61 - 1.24 mg/dL Final  06/04/2017 02:32 AM 5.37 (H) 0.61 - 1.24 mg/dL Final  06/03/2017 04:56 AM 5.50 (H) 0.61 - 1.24 mg/dL Final  06/02/2017 02:28 AM 5.49 (H) 0.61 - 1.24 mg/dL Final  06/01/2017 02:18 AM 5.15 (H) 0.61 - 1.24 mg/dL Final  05/31/2017 02:41 AM 5.11 (H) 0.61 - 1.24 mg/dL Final  05/30/2017 10:49 AM 5.08 (H) 0.61 - 1.24 mg/dL Final  05/30/2017 02:22 AM 5.15 (H) 0.61 - 1.24 mg/dL Final  05/29/2017 06:14 AM 5.38 (H) 0.61 - 1.24 mg/dL Final  05/28/2017 12:53 PM 5.84 (H) 0.61 - 1.24 mg/dL Final  03/09/2016 10:06 AM 1.91 (H) 0.61 - 1.24 mg/dL Final   01/12/2016 02:42 AM 1.51 (H) 0.61 - 1.24 mg/dL Final  01/11/2016 12:01 PM 1.50 (H) 0.61 - 1.24 mg/dL Final  01/10/2016 06:00 PM 1.50 (H) 0.61 - 1.24 mg/dL Final  12/26/2015 03:23 AM 1.70 (H) 0.61 - 1.24 mg/dL Final  12/25/2015 10:39 AM 1.80 (H) 0.61 - 1.24 mg/dL Final  12/24/2015 02:36 AM 1.70 (H) 0.61 - 1.24 mg/dL Final  12/23/2015 03:36 AM 1.67 (H) 0.61 - 1.24 mg/dL Final  12/22/2015 06:55 AM 1.59 (H) 0.61 - 1.24 mg/dL Final  12/12/2012 05:46 PM 1.03 0.50 - 1.35 mg/dL Final  08/21/2012 07:00 PM 0.96 0.50 - 1.35 mg/dL Final  03/03/2010 07:21 AM 0.75 0.4 - 1.5 mg/dL Final    Past Medical History:  Diagnosis Date  . Coronary artery disease    a.12/22/15: NSTEMI s/p overlapping DES x2 and balloon angioplasty to distal AV groove Circumflex (too small for a stent).   . Depression   . Hypercholesteremia   . Hypertension   . Prostate infection   . Tobacco abuse   . Type II diabetes mellitus ([place])     Past Surgical History:  Procedure Laterality Date  . CARDIAC CATHETERIZATION N/A 12/22/2015   Procedure: Left Heart Cath and Coronary Angiography;  Surgeon: Burnell Blanks, MD;  Location: [facility];  Service: Cardiovascular;  Laterality: N/A;  . CARDIAC CATHETERIZATION N/A 12/22/2015   Procedure: Coronary  Stent Intervention;  Surgeon: Burnell Blanks, MD;  Location: [facility];  Service: Cardiovascular;  Laterality: N/A;  . CARDIAC CATHETERIZATION N/A 01/11/2016   Procedure: Left Heart Cath and Coronary Angiography;  Surgeon: Sherren Mocha, MD;  Location: [facility];  Service: Cardiovascular;  Laterality: N/A;  . CARDIAC CATHETERIZATION N/A 01/11/2016   Procedure: Intravascular Pressure Wire/FFR Study;  Surgeon: Sherren Mocha, MD;  Location: [facility];  Service: Cardiovascular;  Laterality: N/A;  . CARDIAC CATHETERIZATION N/A 01/11/2016   Procedure: Coronary Stent Intervention;  Surgeon: Sherren Mocha, MD;  Location: [facility];   Service: Cardiovascular;  Laterality: N/A;  . CORONARY ANGIOPLASTY WITH STENT PLACEMENT  12/22/2015    Family History  Problem Relation Age of Onset  . CAD Brother   . CVA Brother   . CAD Brother    Social History:  reports that he has been smoking cigarettes.  He has a 21.75 pack-year smoking history. He has never used smokeless tobacco. He reports that he drinks about 4.2 oz of alcohol per week. He reports that he does not use drugs.  Allergies: No Known Allergies  Home (SNF) medications: per [facility] from [facility] included: ASA 81 mg, atorvastatin 10, calcitriol 0.25, amlodipine 10, Plavix 75 mg, Lasix 80 QD, Isosorbide 120 ER, Vitamins, sertraline 50 QD, trazadone 50 hs, carvedilol 25 BID, Sodium bicarb 2 tid, thorazine 25 prn for hiccups, hydralazine 100 TID, Novolog SSI, SL nitro prn Prior to Admission medications   Medication Sig Start Date End Date Taking? Authorizing Provider  amLODipine (NORVASC) 10 MG tablet Take 1 tablet (10 mg total) by mouth at bedtime. 06/07/17   Patrecia Pour, MD  aspirin 81 MG chewable tablet Chew 1 tablet (81 mg total) by mouth daily. Patient not taking: Reported on 05/28/2017 01/13/16   Lyda Jester M, PA-C  calcitRIOL (ROCALTROL) 0.25 MCG capsule Take 1 capsule (0.25 mcg total) by mouth daily. 06/08/17   Patrecia Pour, MD  carvedilol (COREG) 25 MG tablet Take 1 tablet (25 mg total) by mouth 2 (two) times daily with a meal. Patient not taking: Reported on 05/28/2017 12/26/15   Reino Bellis B, NP  clopidogrel (PLAVIX) 75 MG tablet TAKE ONE TABLET BY MOUTH DAILY Patient not taking: Reported on 05/28/2017 04/10/17   Burnell Blanks, MD  furosemide (LASIX) 80 MG tablet Take 1 tablet (80 mg total) by mouth daily. 06/08/17   Patrecia Pour, MD  hydrALAZINE (APRESOLINE) 100 MG tablet Take 1 tablet (100 mg total) by mouth 3 (three) times daily. 06/07/17 09/05/17  Patrecia Pour, MD  isosorbide mononitrate (IMDUR) 120 MG 24 hr tablet Take 1 tablet (120 mg total)  by mouth daily. 06/08/17   Patrecia Pour, MD  Multiple Vitamins-Minerals (MULTIVITAMIN ADULT PO) Take 1 tablet by mouth daily.    [provider]  nitroGLYCERIN (NITROSTAT) 0.4 MG SL tablet Place 1 tablet (0.4 mg total) under the tongue every 5 (five) minutes x 3 doses as needed for chest pain. 12/26/15   Cheryln Manly, NP  sertraline (ZOLOFT) 50 MG tablet Take 1 tablet (50 mg total) by mouth daily. 06/08/17   Patrecia Pour, MD  simvastatin (ZOCOR) 20 MG tablet Take 20 mg by mouth daily.    [provider]  sodium bicarbonate 650 MG tablet Take 2 tablets (1,300 mg total) by mouth 2 (two) times daily. 06/07/17   Patrecia Pour, MD  traZODone (DESYREL) 50 MG tablet Take 1  tablet (50 mg total) by mouth at bedtime. 06/09/17   Patrecia Pour, MD    Inpatient medications:   Review of Systems Depressed Says appetite fair No nausea Is unsure if weight low No CP or SOB and no leg swelling Still making some urine  Physical Exam:  Blood pressure 140/70, pulse 66, temperature 98.2 F (36.8 C), temperature source Oral, resp. rate 16, SpO2 92 %.  Small framed depressed appearing Hispanic man NAD VS as noted No rash or cyanosis No JVD Lungs clear no crackles or wheezes S1S2 No S3 Soft systolic murmur LSB Abd soft and not tender No edema of LE's Has saline lock in L FA 1-2 beats asterixis   Recent Labs  Lab 06/25/17 1321  NA 131*  K 5.2*  CL 90*  CO2 28  GLUCOSE 321*  BUN 92*  CREATININE 7.89*  CALCIUM 9.1    Recent Labs  Lab 06/25/17 1321  WBC 6.2  NEUTROABS 4.6  HGB 10.5*  HCT 32.9*  MCV 92.7  PLT 278    Background:  52 yo Hispanic man, PMH HTN, HLD, DM2, strokes (CT 05/2017 old lacunes), CAD prior PTCA. Was here 4/23-06/09/17 for weakness, neurovegetative sx. Discharged to SNF d/t inability to care for self or live alone, residing at [facility].  Initial renal eval done 05/2017 for creatinine of 5.8. All serologies were negative. 4.3 gm proteinuria. Had been taking  NSAIDS so hope was for some possible recovery. Seen by Dr. Carolin Sicks at our office 06/21/17 for F/U of advanced CKD. Creatinine was up to 7.54, BUN 95, K 5.4. Sent to ED and will require initiation of dialysis.   Assessment/Recommendations  1. 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 Had twin with ESRD. Only minimal uremic sx but needs to move forward with access etc more expeditiously than can be done as outpt.  1. Vein mapping was done 06/04/17 (last admit)  2. Have consulted [facility] for [facility] and AVF or AVG.  3. CLIP for outpt HD.  4. Initiate HD when access in place. 2. Anemia - rec'd Feraheme last admission, I don't see any Aranesp. Check Fe studies. No ESA need w/Hb>10. 3. 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 Continue this 4. Metabolic acidosis - has been on Na bicarb 2TID and CO2 normal. Once starts HD will d/c the sodium bicarb.  5. H/O stroke with residual deficits- neg CT for acute last adm, just old lacunes. ASA, plavix, statin 6. Depression - sertraline and trazadone started last adm. Continue 7. HTN - appears controlled on current BP regimen. 8. CAD prior PTCA - nitrates 9. Diabetic retinopathy  Jamal Maes,  MD [facility] [phone] pager 06/25/2017, 2:40 PM

## 2017-06-25 NOTE — ED Triage Notes (Signed)
Per ems- pt comes from [facility] for concern for abnormal kidney function. Do not have paperwork for this. Pt denies any pain. Is a x 4.

## 2017-06-25 NOTE — ED Notes (Signed)
Attempted report 

## 2017-06-26 ENCOUNTER — Other Ambulatory Visit: Payer: Self-pay

## 2017-06-26 DIAGNOSIS — I5042 Chronic combined systolic (congestive) and diastolic (congestive) heart failure: Secondary | ICD-10-CM

## 2017-06-26 LAB — GLUCOSE, CAPILLARY
GLUCOSE-CAPILLARY: 140 mg/dL — AB (ref 65–99)
GLUCOSE-CAPILLARY: 245 mg/dL — AB (ref 65–99)
GLUCOSE-CAPILLARY: 266 mg/dL — AB (ref 65–99)
Glucose-Capillary: 217 mg/dL — ABNORMAL HIGH (ref 65–99)

## 2017-06-26 LAB — RENAL FUNCTION PANEL
ALBUMIN: 3.2 g/dL — AB (ref 3.5–5.0)
Anion gap: 16 — ABNORMAL HIGH (ref 5–15)
BUN: 96 mg/dL — AB (ref 6–20)
CO2: 26 mmol/L (ref 22–32)
CREATININE: 8.09 mg/dL — AB (ref 0.61–1.24)
Calcium: 9.4 mg/dL (ref 8.9–10.3)
Chloride: 94 mmol/L — ABNORMAL LOW (ref 101–111)
GFR, EST AFRICAN AMERICAN: 8 mL/min — AB (ref >60)
GFR, EST NON AFRICAN AMERICAN: 7 mL/min — AB (ref >60)
Glucose, Bld: 148 mg/dL — ABNORMAL HIGH (ref 65–99)
PHOSPHORUS: 6.8 mg/dL — AB (ref 2.5–4.6)
POTASSIUM: 4.9 mmol/L (ref 3.5–5.1)
Sodium: 136 mmol/L (ref 135–145)

## 2017-06-26 LAB — CBC
HCT: 34.9 % — ABNORMAL LOW (ref 39.0–52.0)
Hemoglobin: 11.1 g/dL — ABNORMAL LOW (ref 13.0–17.0)
MCH: 29.8 pg (ref 26.0–34.0)
MCHC: 31.8 g/dL (ref 30.0–36.0)
MCV: 93.6 fL (ref 78.0–100.0)
PLATELETS: 264 10*3/uL (ref 150–400)
RBC: 3.73 MIL/uL — AB (ref 4.22–5.81)
RDW: 12.8 % (ref 11.5–15.5)
WBC: 6.3 10*3/uL (ref 4.0–10.5)

## 2017-06-26 MED ORDER — SODIUM CHLORIDE 0.9 % IV SOLN
125.0000 mg | Freq: Every day | INTRAVENOUS | Status: AC
  Administered 2017-06-26 – 2017-06-28 (×3): 125 mg via INTRAVENOUS
  Filled 2017-06-26 (×5): qty 10

## 2017-06-26 MED ORDER — CEFAZOLIN SODIUM-DEXTROSE 1-4 GM/50ML-% IV SOLN
1.0000 g | INTRAVENOUS | Status: AC
  Administered 2017-06-28: 2 g via INTRAVENOUS
  Filled 2017-06-26 (×2): qty 50

## 2017-06-26 MED ORDER — INSULIN ASPART 100 UNIT/ML ~~LOC~~ SOLN
3.0000 [IU] | Freq: Three times a day (TID) | SUBCUTANEOUS | Status: DC
  Administered 2017-06-26 – 2017-07-04 (×17): 3 [IU] via SUBCUTANEOUS

## 2017-06-26 NOTE — Progress Notes (Signed)
Inpatient Diabetes Program Recommendations  AACE/ADA: New Consensus Statement on Inpatient Glycemic Control (2015)  Target Ranges:  Prepandial:   less than 140 mg/dL      Peak postprandial:   less than 180 mg/dL (1-2 hours)      Critically ill patients:  140 - 180 mg/dL  Results for ELIYAS, SUDDRETH (MRN [id number]) as of 06/26/2017 12:09  Ref. Range 06/25/2017 17:11 06/25/2017 21:57 06/26/2017 07:24 06/26/2017 11:34  Glucose-Capillary Latest Ref Range: 65 - 99 mg/dL 287 (H) 190 (H) 140 (H) 245 (H)    Review of Glycemic Control  Diabetes history: DM2 Outpatient Diabetes medications: Novolog 0-10 units TID Current orders for Inpatient glycemic control: Novolog 0-9 units TID with meals, Novolog 0-5 units QHS  Inpatient Diabetes Program Recommendations: Insulin - Meal Coverage: Please consider ordering Novolog 3 units TID with meals for meal coverage if patient eats at least 50% of meals.  Thanks, Barnie Alderman, RN, MSN, CDE Diabetes Coordinator Inpatient Diabetes Program [phone] (Team Pager from 8am to 5pm)

## 2017-06-26 NOTE — Care Management Note (Addendum)
Case Management Note  Patient Details  Name: Jeffery Young MRN: [id number] Date of Birth: [birth year]  Subjective/Objective: History significant ofDM; HTN; HLD; and CAD, CKD, TIAs; Prior to admission saw nephrology; they suggested he come in to start HD. Previously hospitalized from 4/23-5/5 for acute renal failure on CKD with progression to Stage V CKD. Had vein mapping performed and was planning to have access placement as an outpatient.        Action/Plan: Prior to admission patient resided at [facility]. Once patient is medically stable for discharge and bed available plan to discharge to SNF, per CSW "Lorriane Shire" arrangements. NCM will continue to follow how patient progresses.  Expected Discharge Date:   07/02/2017               Expected Discharge Plan:  Skilled Nursing Facility  In-House Referral:  Clinical Social Work  Discharge planning Services  CM Consult  Status of Service:  In process, will continue to follow  Kristen Cardinal, RN 06/26/2017, 2:58 PM

## 2017-06-26 NOTE — Progress Notes (Signed)
[facility]                                                                              Patient Demographics  Jeffery Young, is a 52 y.o. male, DOB - 1965/11/01, YBW:[id number]  Admit date - 06/25/2017   Admitting Physician Karmen Bongo, MD  Outpatient Primary MD for the patient is [name], MD  Outpatient specialists:   LOS - 1  days   Medical records reviewed and are as summarized below:    No chief complaint on file.      Brief summary    Jeffery Young is a 52 y.o. male with medical history significant of DM; HTN; HLD; and CAD, CKD, history of TIAs, presented with generalized weakness, terms of uremia, worsening creatinine. He does still make urine - voids about 3-4 times daily.  This time, he was having ongoing fatigue and he saw nephrology; they suggested he come in to start HD. He was previously hospitalized from 4/23-5/5 for acute renal failure on CKD with progression to Stage V CKD.  He has had vein mapping performed and was planning to have access placement as an outpatient.    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) ([place]) -Vein mapping done on 05/25/2017, nephrology and vascular surgery consulted, plan for [facility] placement, left arm AV fistula versus graft placement this week  -Will need to be placed on clip process once HD is initiated -Surgery scheduled on 06/28/2017  Active Problems:   Type 2 diabetes mellitus with diabetic nephropathy, without long-term current use of insulin ([place]), uncontrolled with hyperglycemia -Placed on a sliding scale insulin sensitive, NovoLog meal coverage 3 units 3 times daily AC -Hemoglobin A1c 6.9 on 4/24     Essential hypertension -Continue Coreg, hydralazine, currently stable    Hypercholesteremia  - continue simvastatin    Chronic combined systolic and diastolic heart failure ([place]) -Echo 1/18 showed preserved EF, grade 1 diastolic  dysfunction    Depression Continue Zoloft    Normocytic anemia likely anemia of chronic kidney disease H&H currently stable  History of CVA -Continue aspirin, Plavix, Imdur, statin   Code Status: Full CODE STATUS DVT Prophylaxis: Lovenox Family Communication: Discussed in detail with the patient, all imaging results, lab results explained to the patient    Disposition Plan:   Time Spent in minutes 25-minutes  Procedures:  None  Consultants:   Nephrology  Antimicrobials:      Medications  Scheduled Meds: . amLODipine  10 mg Oral QHS  . aspirin  81 mg Oral Daily  . calcitRIOL  0.25 mcg Oral Daily  . carvedilol  25 mg Oral BID WC  . docusate sodium  100 mg Oral BID  . enoxaparin (LOVENOX) injection  30 mg Subcutaneous Q24H  . hydrALAZINE  100 mg Oral TID  . insulin aspart  0-5 Units Subcutaneous QHS  . insulin aspart  0-9 Units Subcutaneous TID WC  . insulin aspart  3 Units Subcutaneous TID WC  . isosorbide mononitrate  120 mg Oral Daily  . sertraline  50 mg Oral Daily  .  simvastatin  20 mg Oral Daily  . sodium bicarbonate  1,300 mg Oral BID  . sodium chloride flush  3 mL Intravenous Q12H  . traZODone  50 mg Oral QHS   Continuous Infusions: . [START ON 06/28/2017]  ceFAZolin (ANCEF) IV    . ferric gluconate (FERRLECIT/NULECIT) IV Stopped (06/26/17 1148)   PRN Meds:.acetaminophen **OR** acetaminophen, calcium carbonate (dosed in mg elemental calcium), camphor-menthol **AND** hydrOXYzine, docusate sodium, feeding supplement (NEPRO CARB STEADY), ondansetron **OR** ondansetron (ZOFRAN) IV, sorbitol, zolpidem   Antibiotics   Anti-infectives (From admission, onward)   Start     Dose/Rate Route Frequency Ordered Stop   06/28/17 0600  ceFAZolin (ANCEF) IVPB 1 g/50 mL premix    Note to Pharmacy:  Send with pt to OR   1 g 100 mL/hr over 30 Minutes Intravenous To [facility] 06/26/17 0814 06/29/17 0600        Subjective:   Jeffery Young was seen and  examined today.  Feels stable, no acute issues overnight, no new complaints. Patient denies dizziness, chest pain, shortness of breath, abdominal pain, N/V/D/C. No acute events overnight.    Objective:   Vitals:   06/25/17 1949 06/25/17 2250 06/26/17 0551 06/26/17 1005  BP: 135/78 (!) 155/81 139/79 (!) 152/77  Pulse: 63 69 81 64  Resp: (!) 22 16 17 18   Temp: 98 F (36.7 C) 98.3 F (36.8 C)  98.2 F (36.8 C)  TempSrc: Oral Oral  Oral  SpO2: 95% 95% 93% 96%    Intake/Output Summary (Last 24 hours) at 06/26/2017 1311 Last data filed at 06/26/2017 0900 Gross per 24 hour  Intake 440 ml  Output 1275 ml  Net -835 ml     Wt Readings from Last 3 Encounters:  06/09/17 55.7 kg (122 lb 12.8 oz)  11/20/16 58.9 kg (129 lb 12.8 oz)  06/14/16 59.3 kg (130 lb 12.8 oz)     Exam  General: Alert and oriented x 3, NAD  Eyes:  HEENT:  Atraumatic, normocephalic, normal oropharynx  Cardiovascular: S1 S2 auscultated,  Regular rate and rhythm.  Respiratory: Clear to auscultation bilaterally, no wheezing, rales or rhonchi  Gastrointestinal: Soft, nontender, nondistended, + bowel sounds  Ext: no pedal edema bilaterally  Neuro: no new deficits, mild decreased strength of the left lower extremity  Musculoskeletal: No digital cyanosis, clubbing  Skin: No rashes  Psych: Normal affect and demeanor, alert and oriented x3    Data Reviewed:  I have personally reviewed following labs and imaging studies  Micro Results No results found for this or any previous visit (from the past 240 hour(s)).  Radiology Reports Dg Chest 2 View  Result Date: 05/28/2017 CLINICAL DATA:  Hypertension EXAM: CHEST - 2 VIEW COMPARISON:  March 09, 2016 FINDINGS: Lungs are clear. Heart size and pulmonary vascularity are normal. No adenopathy. No bone lesions. IMPRESSION: No edema or consolidation. Electronically Signed   By: Lowella Grip III M.D.   On: 05/28/2017 14:33   Ct Head Wo Contrast  Result  Date: 05/28/2017 CLINICAL DATA:  52 y/o  M; MI 1 year ago with deterioration. EXAM: CT HEAD WITHOUT CONTRAST TECHNIQUE: Contiguous axial images were obtained from the base of the skull through the vertex without intravenous contrast. COMPARISON:  03/09/2016 CT head FINDINGS: Brain: No evidence of acute infarction, hemorrhage, hydrocephalus, extra-axial collection or mass lesion/mass effect. Small chronic lacunar infarcts are present within the bilateral lentiform nuclei, right thalamus, and right pons. A focus within the right putamen is new from the prior  study. There is a background of chronic microvascular ischemic changes and parenchymal volume loss of the brain that is mildly progressed. Vascular: Calcific atherosclerosis of carotid siphons. No hyperdense vessel identified. Skull: Normal. Negative for fracture or focal lesion. Sinuses/Orbits: No acute finding. Other: Left intra-ocular lens replacement. IMPRESSION: 1. No acute intracranial abnormality identified. 2. Mild progression of chronic microvascular ischemic changes and parenchymal volume loss of the brain. Several small chronic lacunar infarcts in basal ganglia and brainstem. Electronically Signed   By: Kristine Garbe M.D.   On: 05/28/2017 13:30   US Renal  Result Date: 05/29/2017 CLINICAL DATA:  Renal failure. History of diabetes and hypertension. EXAM: RENAL / URINARY TRACT ULTRASOUND COMPLETE COMPARISON:  None. FINDINGS: Right Kidney: Length: 10.7 cm. Diffusely increased parenchymal echotexture consistent with chronic medical renal disease. No hydronephrosis. Stone in the midpole right kidney measuring 11 mm diameter. Left Kidney: Length: 11.1 cm. Diffusely increased renal parenchymal echotexture consistent with chronic medical renal disease. No hydronephrosis or solid mass identified. Bladder: No bladder wall thickening or filling defects identified. IMPRESSION: 1. Diffusely increased parenchymal echotexture to both kidneys consistent  with chronic medical renal disease. 2. Nonobstructing 11 mm stone in the right kidney. 3. No hydronephrosis. Electronically Signed   By: Lucienne Capers M.D.   On: 05/29/2017 00:32    Lab Data:  CBC: Recent Labs  Lab 06/25/17 1321 06/26/17 0325  WBC 6.2 6.3  NEUTROABS 4.6  --   HGB 10.5* 11.1*  HCT 32.9* 34.9*  MCV 92.7 93.6  PLT 278 010   Basic Metabolic Panel: Recent Labs  Lab 06/25/17 1321 06/26/17 0325  NA 131* 136  K 5.2* 4.9  CL 90* 94*  CO2 28 26  GLUCOSE 321* 148*  BUN 92* 96*  CREATININE 7.89* 8.09*  CALCIUM 9.1 9.4  MG 2.7*  --   PHOS  --  6.8*   GFR: CrCl cannot be calculated (Unknown ideal weight.). Liver Function Tests: Recent Labs  Lab 06/26/17 0325  ALBUMIN 3.2*   No results for input(s): LIPASE, AMYLASE in the last 168 hours. No results for input(s): AMMONIA in the last 168 hours. Coagulation Profile: No results for input(s): INR, PROTIME in the last 168 hours. Cardiac Enzymes: No results for input(s): CKTOTAL, CKMB, CKMBINDEX, TROPONINI in the last 168 hours. BNP (last 3 results) No results for input(s): PROBNP in the last 8760 hours. HbA1C: No results for input(s): HGBA1C in the last 72 hours. CBG: Recent Labs  Lab 06/25/17 1711 06/25/17 2157 06/26/17 0724 06/26/17 1134  GLUCAP 287* 190* 140* 245*   Lipid Profile: No results for input(s): CHOL, HDL, LDLCALC, TRIG, CHOLHDL, LDLDIRECT in the last 72 hours. Thyroid Function Tests: No results for input(s): TSH, T4TOTAL, FREET4, T3FREE, THYROIDAB in the last 72 hours. Anemia Panel: Recent Labs    06/25/17 1650  VITAMINB12 524  FOLATE 23.5  FERRITIN 155  TIBC 228*  IRON 48  RETICCTPCT 1.3   Urine analysis:    Component Value Date/Time   COLORURINE YELLOW 06/25/2017 1300   APPEARANCEUR CLEAR 06/25/2017 1300   LABSPEC 1.009 06/25/2017 1300   PHURINE 7.0 06/25/2017 1300   GLUCOSEU >=500 (A) 06/25/2017 1300   HGBUR NEGATIVE 06/25/2017 1300   BILIRUBINUR NEGATIVE 06/25/2017  1300   KETONESUR NEGATIVE 06/25/2017 1300   PROTEINUR 100 (A) 06/25/2017 1300   UROBILINOGEN 1.0 08/21/2012 2250   NITRITE NEGATIVE 06/25/2017 1300   LEUKOCYTESUR NEGATIVE 06/25/2017 1300     Ripudeep Rai M.D. [facility] 06/26/2017, 1:11 PM  Pager:  [phone] Between 7am to 7pm - call Pager - [phone]  After 7pm go to [email or web address] - password [email or web address]  Call night coverage person covering after 7pm

## 2017-06-26 NOTE — Progress Notes (Signed)
Alden Server, RN called and informed that the pt's HD treatment has been moved 06/27/2017.

## 2017-06-26 NOTE — Progress Notes (Addendum)
[name]  Subjective/Interval History:  VVS has seen Access planned for Friday Trying to see if can get at least get [facility] in sooner so as to move HD process along  Objective Vital signs in last 24 hours: Vitals:   06/25/17 1830 06/25/17 1949 06/25/17 2250 06/26/17 0551  BP:  135/78 (!) 155/81 139/79  Pulse: 66 63 69 81  Resp: 14 (!) 22 16 17   Temp:  98 F (36.7 C) 98.3 F (36.8 C)   TempSrc:  Oral Oral   SpO2: 93% 95% 95% 93%   Weight change:   Intake/Output Summary (Last 24 hours) at 06/26/2017 0953 Last data filed at 06/26/2017 6144 Gross per 24 hour  Intake 200 ml  Output 1175 ml  Net -975 ml   Physical Exam:  Blood pressure 139/79, pulse 81, temperature 98.3 F (36.8 C), temperature source Oral, resp. rate 17, SpO2 93 %.  Small framed depressed appearing Hispanic man NAD VS as noted No JVD Lungs clear no crackles or wheezes S1S2 No S3 Soft systolic murmur LSB Abd soft and not tender No edema of LE's  Recent Labs  Lab 06/25/17 1321 06/26/17 0325  NA 131* 136  K 5.2* 4.9  CL 90* 94*  CO2 28 26  GLUCOSE 321* 148*  BUN 92* 96*  CREATININE 7.89* 8.09*  CALCIUM 9.1 9.4  PHOS  --  6.8*    Recent Labs  Lab 06/26/17 0325  ALBUMIN 3.2*    Recent Labs  Lab 06/25/17 1321 06/26/17 0325  WBC 6.2 6.3  NEUTROABS 4.6  --   HGB 10.5* 11.1*  HCT 32.9* 34.9*  MCV 92.7 93.6  PLT 278 264    Recent Labs  Lab 06/25/17 1711 06/25/17 2157 06/26/17 0724  GLUCAP 287* 190* 140*     Recent Labs  Lab 06/25/17 1650  IRON 48  TIBC 228*  FERRITIN 155   Medications . [START ON 06/28/2017]  ceFAZolin (ANCEF) IV     . amLODipine  10 mg Oral QHS  . aspirin  81 mg Oral Daily  . calcitRIOL  0.25 mcg Oral Daily  . carvedilol  25 mg Oral BID WC  . clopidogrel  75 mg Oral Daily  . docusate sodium  100 mg Oral BID  . enoxaparin (LOVENOX) injection  30 mg Subcutaneous Q24H  . hydrALAZINE  100 mg Oral TID  . insulin aspart  0-5 Units Subcutaneous QHS  .  insulin aspart  0-9 Units Subcutaneous TID WC  . isosorbide mononitrate  120 mg Oral Daily  . sertraline  50 mg Oral Daily  . simvastatin  20 mg Oral Daily  . sodium bicarbonate  1,300 mg Oral BID  . sodium chloride flush  3 mL Intravenous Q12H  . 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 Was here 4/23-06/09/17 for weakness, neurovegetative sx. Discharged to SNF d/t inability to care for self or live alone, residing at [facility].  Initial renal eval done 05/2017 for creatinine of 5.8. All serologies were negative. 4.3 gm proteinuria. Had been taking NSAIDS so hope was for some possible recovery. Seen by Dr. Carolin Sicks at our office 06/21/17 for F/U of advanced CKD. Creatinine was up to 7.54, BUN 95, K 5.4.Will require initiation of dialysis.   Assessment/Recommendations  1. 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 Had twin with ESRD. Only minimal uremic sx but needs to move forward with access  etc more expeditiously than can be done as outpt.  1. Vein mapping was done 06/04/17 (last admit)  2. Consulted VVS for [facility] and AVF or AVG. Dr. Scot Dock. Not scheduled until Friday - will see if any way can at least get his [facility] before that so can get HD started as otherwise just sitting in the hospital waiting 3. CLIP for outpt HD.  4. Initiate HD when access in place. 2. Anemia - rec'd Feraheme last admission. TSat 21. Give 3 doses Ferrlecit. No ESA need w/Hb>10. 3. 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 Continue this 4. Metabolic acidosis - has been on Na bicarb 2TID and CO2 normal. Once starts HD will d/c the sodium bicarb.  5. H/O stroke with residual deficits- neg CT for acute last adm, just old lacunes. ASA, plavix, statin 6. Depression - sertraline and trazadone started last adm. Continue 7. HTN - appears controlled on current BP regimen. 8. CAD prior PTCA - nitrates   Jamal Maes, MD [facility] [phone] pager 06/26/2017,  9:53 AM   Addendum: Drs Scot Dock and Early tied up. Recommend getting IR to place catheter and they will do AVF on Friday)  Jamal Maes, MD [place] Pager 06/26/2017, 10:50 AM   Addendum #2 IR will not place [facility] as pt has not been off Plavix for 5 days, so will stop the Plavix and again go back to the Friday date for both [facility] and permanent access.  Jamal Maes, MD [facility] [phone] Pager 06/26/2017, 11:05 AM

## 2017-06-26 NOTE — Progress Notes (Signed)
VASCULAR SURGERY:  He is scheduled for a left AV fistula versus AV graft and a tunneled dialysis catheter on Friday.  Deitra Mayo, MD, [place] [phone] Office: [phone]

## 2017-06-27 LAB — RENAL FUNCTION PANEL
ALBUMIN: 3.1 g/dL — AB (ref 3.5–5.0)
Anion gap: 14 (ref 5–15)
BUN: 90 mg/dL — AB (ref 6–20)
CHLORIDE: 93 mmol/L — AB (ref 101–111)
CO2: 27 mmol/L (ref 22–32)
Calcium: 9.1 mg/dL (ref 8.9–10.3)
Creatinine, Ser: 7.81 mg/dL — ABNORMAL HIGH (ref 0.61–1.24)
GFR calc Af Amer: 8 mL/min — ABNORMAL LOW (ref >60)
GFR calc non Af Amer: 7 mL/min — ABNORMAL LOW (ref >60)
GLUCOSE: 167 mg/dL — AB (ref 65–99)
PHOSPHORUS: 6.8 mg/dL — AB (ref 2.5–4.6)
POTASSIUM: 4.5 mmol/L (ref 3.5–5.1)
Sodium: 134 mmol/L — ABNORMAL LOW (ref 135–145)

## 2017-06-27 LAB — GLUCOSE, CAPILLARY
GLUCOSE-CAPILLARY: 153 mg/dL — AB (ref 65–99)
GLUCOSE-CAPILLARY: 333 mg/dL — AB (ref 65–99)
Glucose-Capillary: 162 mg/dL — ABNORMAL HIGH (ref 65–99)
Glucose-Capillary: 104 mg/dL — ABNORMAL HIGH (ref 65–99)

## 2017-06-27 MED ORDER — CEFAZOLIN SODIUM-DEXTROSE 1-4 GM/50ML-% IV SOLN: 1.0000 g | INTRAVENOUS | Status: DC

## 2017-06-27 NOTE — Progress Notes (Signed)
[facility]                                                                              Patient Demographics  Jeffery Young, is a 52 y.o. male, DOB - Oct 05, 1965, NWG:[id number]  Admit date - 06/25/2017   Admitting Physician Karmen Bongo, MD  Outpatient Primary MD for the patient is [name], MD  Outpatient specialists:   LOS - 2  days   Medical records reviewed and are as summarized below:    No chief complaint on file.      Brief summary    Jeffery Young is a 52 y.o. male with medical history significant of DM; HTN; HLD; and CAD, CKD, history of TIAs, presented with generalized weakness, terms of uremia, worsening creatinine. He does still make urine - voids about 3-4 times daily.  This time, he was having ongoing fatigue and he saw nephrology; they suggested he come in to start HD. He was previously hospitalized from 4/23-5/5 for acute renal failure on CKD with progression to Stage V CKD.  He has had vein mapping performed and was planning to have access placement as an outpatient.    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 after midnight   Active Problems:   Type 2 diabetes mellitus with diabetic nephropathy, without long-term current use of insulin ([place]), uncontrolled with hyperglycemia -Continue sliding scale insulin, NovoLog meal coverage, CBGs improving -Hemoglobin A1c 6.9 on 4/24     Essential hypertension -Continue Coreg, hydralazine.  BP expected to improve after the HD initiated     Hypercholesteremia  - continue simvastatin    Chronic combined systolic and diastolic heart failure ([place]) -Echo 1/18 showed preserved EF, grade 1  diastolic dysfunction    Depression Continue Zoloft    Normocytic anemia likely anemia of chronic kidney disease H&H currently stable  History of CVA -Continue aspirin, Plavix, Imdur, statin, stable    Code Status: Full CODE STATUS DVT Prophylaxis: Lovenox Family Communication: Discussed in detail with the patient, all imaging results, lab results explained to the patient    Disposition Plan:   Time Spent in minutes 25-minutes  Procedures:  None  Consultants:   Nephrology  Antimicrobials:      Medications  Scheduled Meds: . amLODipine  10 mg Oral QHS  . aspirin  81 mg Oral Daily  . calcitRIOL  0.25 mcg Oral Daily  . carvedilol  25 mg Oral BID WC  . docusate sodium  100 mg Oral BID  . enoxaparin (LOVENOX) injection  30 mg Subcutaneous Q24H  . hydrALAZINE  100 mg Oral TID  . insulin aspart  0-5 Units Subcutaneous QHS  . insulin aspart  0-9 Units Subcutaneous TID WC  . insulin aspart  3 Units Subcutaneous TID WC  . isosorbide mononitrate  120 mg Oral Daily  . sertraline  50 mg Oral Daily  . simvastatin  20 mg Oral Daily  . sodium bicarbonate  1,300 mg Oral BID  . sodium chloride flush  3 mL Intravenous Q12H  . traZODone  50 mg Oral QHS   Continuous Infusions: . [START ON 06/28/2017]  ceFAZolin (ANCEF) IV    . ferric gluconate (FERRLECIT/NULECIT) IV Stopped (06/27/17 1057)   PRN Meds:.acetaminophen **OR** acetaminophen, calcium carbonate (dosed in mg elemental calcium), camphor-menthol **AND** hydrOXYzine, docusate sodium, feeding supplement (NEPRO CARB STEADY), ondansetron **OR** ondansetron (ZOFRAN) IV, sorbitol, zolpidem   Antibiotics   Anti-infectives (From admission, onward)   Start     Dose/Rate Route Frequency Ordered Stop   06/28/17 0600  ceFAZolin (ANCEF) IVPB 1 g/50 mL premix    Note to Pharmacy:  Send with pt to OR   1 g 100 mL/hr over 30 Minutes Intravenous To [facility] 06/26/17 0814 06/29/17 0600   06/28/17 0000  ceFAZolin (ANCEF)  IVPB 1 g/50 mL premix  Status:  Discontinued    Note to Pharmacy:  Send with pt to OR   1 g 100 mL/hr over 30 Minutes Intravenous On call 06/27/17 0815 06/27/17 0817        Subjective:   Jeffery Young was seen and examined today.  No acute issues, awaiting OR tomorrow.   Patient denies dizziness, chest pain, shortness of breath, abdominal pain, N/V/D/C.  No acute events overnight Objective:   Vitals:   06/26/17 2046 06/27/17 0000 06/27/17 0440 06/27/17 0940  BP: (!) 151/78  (!) 154/79 (!) 158/77  Pulse: 62  66 68  Resp: 17  17 18   Temp: 97.9 F (36.6 C)  97.8 F (36.6 C) 98 F (36.7 C)  TempSrc: Oral  Oral Oral  SpO2: 94%  96% 98%  Height:  5\' 5"  (1.651 m)      Intake/Output Summary (Last 24 hours) at 06/27/2017 1206 Last data filed at 06/27/2017 0900 Gross per 24 hour  Intake 720 ml  Output 1125 ml  Net -405 ml     Wt Readings from Last 3 Encounters:  06/09/17 55.7 kg (122 lb 12.8 oz)  11/20/16 58.9 kg (129 lb 12.8 oz)  06/14/16 59.3 kg (130 lb 12.8 oz)     Exam   General: Alert and oriented x 3, NAD  Eyes:   HEENT:  Atraumatic, normocephalic  Cardiovascular: S1 S2 auscultated, Regular rate and rhythm. No pedal edema b/l  Respiratory: Clear to auscultation bilaterally, no wheezing, rales or rhonchi  Gastrointestinal: Soft, nontender, nondistended, + bowel sounds  Ext: no pedal edema bilaterally  Neuro: no new deficit  Musculoskeletal: No digital cyanosis, clubbing  Skin: No rashes  Psych: flat affect    Data Reviewed:  I have personally reviewed following labs and imaging studies  Micro Results No results found for this or any previous visit (from the past 240 hour(s)).  Radiology Reports Dg Chest 2 View  Result Date: 05/28/2017 CLINICAL DATA:  Hypertension EXAM: CHEST - 2 VIEW COMPARISON:  March 09, 2016 FINDINGS: Lungs are clear. Heart size and pulmonary vascularity are normal. No adenopathy. No bone lesions. IMPRESSION: No edema or  consolidation. Electronically Signed   By: Lowella Grip III M.D.   On: 05/28/2017 14:33   Ct Head Wo Contrast  Result Date: 05/28/2017 CLINICAL DATA:  52 y/o  M; MI 1 year ago with deterioration. EXAM: CT HEAD WITHOUT CONTRAST TECHNIQUE: Contiguous axial images were obtained from the base of the skull through the vertex without intravenous contrast. COMPARISON:  03/09/2016 CT head FINDINGS: Brain: No evidence of acute infarction, hemorrhage, hydrocephalus, extra-axial collection or mass lesion/mass effect. Small chronic lacunar infarcts are present within the bilateral lentiform nuclei, right thalamus, and right pons. A focus within the right putamen is new from the prior study. There is a background of chronic microvascular ischemic changes and parenchymal volume loss of the brain that is mildly progressed. Vascular: Calcific atherosclerosis of carotid siphons. No hyperdense vessel identified. Skull: Normal. Negative for fracture or focal lesion. Sinuses/Orbits: No acute finding. Other: Left intra-ocular lens replacement. IMPRESSION: 1. No acute intracranial abnormality identified. 2. Mild progression of chronic microvascular ischemic changes and parenchymal volume loss of the brain. Several small chronic lacunar infarcts in basal ganglia and brainstem. Electronically Signed   By: Kristine Garbe M.D.   On: 05/28/2017 13:30   US Renal  Result Date: 05/29/2017 CLINICAL DATA:  Renal failure. History of diabetes and hypertension. EXAM: RENAL / URINARY TRACT ULTRASOUND COMPLETE COMPARISON:  None. FINDINGS: Right Kidney: Length: 10.7 cm. Diffusely increased parenchymal echotexture consistent with chronic medical renal disease. No hydronephrosis. Stone in the midpole right kidney measuring 11 mm diameter. Left Kidney: Length: 11.1 cm. Diffusely increased renal parenchymal echotexture consistent with chronic medical renal disease. No hydronephrosis or solid mass identified. Bladder: No bladder wall  thickening or filling defects identified. IMPRESSION: 1. Diffusely increased parenchymal echotexture to both kidneys consistent with chronic medical renal disease. 2. Nonobstructing 11 mm stone in the right kidney. 3. No hydronephrosis. Electronically Signed   By: Lucienne Capers M.D.   On: 05/29/2017 00:32    Lab Data:  CBC: Recent Labs  Lab 06/25/17 1321 06/26/17 0325  WBC 6.2 6.3  NEUTROABS 4.6  --   HGB 10.5* 11.1*  HCT 32.9* 34.9*  MCV 92.7 93.6  PLT 278 962   Basic Metabolic Panel: Recent Labs  Lab 06/25/17 1321 06/26/17 0325 06/27/17 0632  NA 131* 136 134*  K 5.2* 4.9 4.5  CL 90* 94* 93*  CO2 28 26 27   GLUCOSE 321* 148* 167*  BUN 92* 96* 90*  CREATININE 7.89* 8.09* 7.81*  CALCIUM 9.1 9.4 9.1  MG 2.7*  --   --   PHOS  --  6.8* 6.8*   GFR: CrCl cannot be calculated (Unknown ideal weight.). Liver Function Tests: Recent Labs  Lab 06/26/17 0325 06/27/17 8366  ALBUMIN 3.2* 3.1*   No results for input(s): LIPASE, AMYLASE in the last 168 hours. No results for input(s): AMMONIA in the last 168 hours. Coagulation Profile: No results for input(s): INR, PROTIME in the last 168 hours. Cardiac Enzymes: No results for input(s): CKTOTAL, CKMB, CKMBINDEX, TROPONINI in the last 168 hours. BNP (last 3 results) No results for input(s): PROBNP in the last 8760 hours. HbA1C: No results for input(s): HGBA1C in the last 72 hours. CBG: Recent Labs  Lab 06/26/17 1134 06/26/17 1639 06/26/17 2048 06/27/17 0727 06/27/17 1139  GLUCAP 245* 266* 217* 153* 162*   Lipid Profile: No results for input(s): CHOL, HDL, LDLCALC, TRIG, CHOLHDL, LDLDIRECT in the last 72 hours. Thyroid Function Tests: No results for input(s): TSH, T4TOTAL, FREET4, T3FREE, THYROIDAB in the last 72 hours. Anemia Panel: Recent Labs    06/25/17 1650  VITAMINB12 524  FOLATE 23.5  FERRITIN 155  TIBC 228*  IRON 48  RETICCTPCT 1.3   Urine analysis:    Component Value Date/Time   COLORURINE  YELLOW 06/25/2017 1300   APPEARANCEUR CLEAR 06/25/2017 1300   LABSPEC 1.009 06/25/2017 1300   PHURINE 7.0 06/25/2017  1300   GLUCOSEU >=500 (A) 06/25/2017 1300   HGBUR NEGATIVE 06/25/2017 1300   BILIRUBINUR NEGATIVE 06/25/2017 1300   KETONESUR NEGATIVE 06/25/2017 1300   PROTEINUR 100 (A) 06/25/2017 1300   UROBILINOGEN 1.0 08/21/2012 2250   NITRITE NEGATIVE 06/25/2017 1300   LEUKOCYTESUR NEGATIVE 06/25/2017 1300     Ripudeep Rai M.D. [facility] 06/27/2017, 12:06 PM  Pager: [phone] Between 7am to 7pm - call Pager - [phone]  After 7pm go to [email or web address] - password [email or web address]  Call night coverage person covering after 7pm

## 2017-06-27 NOTE — H&P (View-Only) (Signed)
   VASCULAR SURGERY ASSESSMENT & PLAN:   For possible left brachiocephalic fistula, basilic vein transposition, or AV graft tomorrow and placement of tunneled dialysis catheter.  I have previously discussed the procedure and potential complications with the patient.  This morning he has no further questions.  SUBJECTIVE:   No complaints.  PHYSICAL EXAM:   Vitals:   06/26/17 1721 06/26/17 2046 06/27/17 0000 06/27/17 0440  BP: (!) 146/79 (!) 151/78  (!) 154/79  Pulse: 64 62  66  Resp: 18 17  17   Temp: 98.1 F (36.7 C) 97.9 F (36.6 C)  97.8 F (36.6 C)  TempSrc: Oral Oral  Oral  SpO2: 92% 94%  96%  Height:   5\' 5"  (1.651 m)    Palpable left radial pulse.  LABS:   Lab Results  Component Value Date   WBC 6.3 06/26/2017   HGB 11.1 (L) 06/26/2017   HCT 34.9 (L) 06/26/2017   MCV 93.6 06/26/2017   PLT 264 06/26/2017   Lab Results  Component Value Date   CREATININE 7.81 (H) 06/27/2017   Lab Results  Component Value Date   INR 1.06 01/11/2016   CBG (last 3)  Recent Labs    06/26/17 1639 06/26/17 2048 06/27/17 0727  GLUCAP 266* 217* 153*    PROBLEM LIST:    Principal Problem:   ESRD (end stage renal disease) on dialysis [facility]) Active Problems:   Type 2 diabetes mellitus with diabetic nephropathy, without long-term current use of insulin (HCC)   Essential hypertension   Hypercholesteremia   Chronic combined systolic and diastolic heart failure (HCC)   Depression   Normocytic anemia   History of completed stroke   CURRENT MEDS:   . amLODipine  10 mg Oral QHS  . aspirin  81 mg Oral Daily  . calcitRIOL  0.25 mcg Oral Daily  . carvedilol  25 mg Oral BID WC  . docusate sodium  100 mg Oral BID  . enoxaparin (LOVENOX) injection  30 mg Subcutaneous Q24H  . hydrALAZINE  100 mg Oral TID  . insulin aspart  0-5 Units Subcutaneous QHS  . insulin aspart  0-9 Units Subcutaneous TID WC  . insulin aspart  3 Units Subcutaneous TID WC  . isosorbide mononitrate  120 mg  Oral Daily  . sertraline  50 mg Oral Daily  . simvastatin  20 mg Oral Daily  . sodium bicarbonate  1,300 mg Oral BID  . sodium chloride flush  3 mL Intravenous Q12H  . traZODone  50 mg Oral QHS    Deitra Mayo Beeper: [phone] Office: [phone] 06/27/2017

## 2017-06-27 NOTE — Progress Notes (Signed)
[name]  Subjective/Interval History:  VVS has seen and access planned for tomorrow [facility] + permanent) Will start HD after Numbers all about the same  Objective Vital signs in last 24 hours: Vitals:   06/26/17 2046 06/27/17 0000 06/27/17 0440 06/27/17 0940  BP: (!) 151/78  (!) 154/79 (!) 158/77  Pulse: 62  66 68  Resp: 17  17 18   Temp: 97.9 F (36.6 C)  97.8 F (36.6 C) 98 F (36.7 C)  TempSrc: Oral  Oral Oral  SpO2: 94%  96% 98%  Height:  5\' 5"  (1.651 m)     Weight change:   Intake/Output Summary (Last 24 hours) at 06/27/2017 1238 Last data filed at 06/27/2017 0900 Gross per 24 hour  Intake 720 ml  Output 1125 ml  Net -405 ml   Physical Exam:  Blood pressure (!) 158/77, pulse 68, temperature 98 F (36.7 C), temperature source Oral, resp. rate 18, height 5\' 5"  (1.651 m), SpO2 98 %.  Small framed depressed appearing Hispanic man NAD VS as noted No JVD Lungs clear no crackles or wheezes S1S2 No S3 Soft systolic murmur LSB Abd soft and not tender No edema of LE's  Recent Labs  Lab 06/25/17 1321 06/26/17 0325 06/27/17 0632  NA 131* 136 134*  K 5.2* 4.9 4.5  CL 90* 94* 93*  CO2 28 26 27   GLUCOSE 321* 148* 167*  BUN 92* 96* 90*  CREATININE 7.89* 8.09* 7.81*  CALCIUM 9.1 9.4 9.1  PHOS  --  6.8* 6.8*    Recent Labs  Lab 06/26/17 0325 06/27/17 0632  ALBUMIN 3.2* 3.1*    Recent Labs  Lab 06/25/17 1321 06/26/17 0325  WBC 6.2 6.3  NEUTROABS 4.6  --   HGB 10.5* 11.1*  HCT 32.9* 34.9*  MCV 92.7 93.6  PLT 278 264    Recent Labs  Lab 06/26/17 1134 06/26/17 1639 06/26/17 2048 06/27/17 0727 06/27/17 1139  GLUCAP 245* 266* 217* 153* 162*     Recent Labs  Lab 06/25/17 1650  IRON 48  TIBC 228*  FERRITIN 155   Medications . [START ON 06/28/2017]  ceFAZolin (ANCEF) IV    . ferric gluconate (FERRLECIT/NULECIT) IV Stopped (06/27/17 1057)   . amLODipine  10 mg Oral QHS  . aspirin  81 mg Oral Daily  . calcitRIOL  0.25 mcg Oral Daily  .  carvedilol  25 mg Oral BID WC  . docusate sodium  100 mg Oral BID  . enoxaparin (LOVENOX) injection  30 mg Subcutaneous Q24H  . hydrALAZINE  100 mg Oral TID  . insulin aspart  0-5 Units Subcutaneous QHS  . insulin aspart  0-9 Units Subcutaneous TID WC  . insulin aspart  3 Units Subcutaneous TID WC  . isosorbide mononitrate  120 mg Oral Daily  . sertraline  50 mg Oral Daily  . simvastatin  20 mg Oral Daily  . sodium bicarbonate  1,300 mg Oral BID  . sodium chloride flush  3 mL Intravenous Q12H  . 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 Was here 4/23-06/09/17 for weakness, neurovegetative sx. Discharged to SNF d/t inability to care for self or live alone, residing at [facility].  Initial renal eval done 05/2017 for creatinine of 5.8. All serologies were negative. 4.3 gm proteinuria. Had been taking NSAIDS so hope was for some possible recovery. Seen by Dr. Carolin Sicks at our office 06/21/17  for F/U of advanced CKD. Creatinine was up to 7.54, BUN 95, K 5.4.Sent to ED by [facility]. Will require initiation of dialysis.   Assessment/Recommendations  1. 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 Had twin with ESRD. Only minimal uremic sx but needs to move forward with access etc more expeditiously than can be done as outpt.  1. Vein mapping was done 06/04/17 (last admit)  2. Consulted VVS for [facility] and AVF or AVG. Dr. Scot Dock. To be done on Friday (IR could not do TDC as pt was on Plavix - holding that until after access tomorrow w/VVS) 3. CLIP for outpt HD process started.  4. HD #1 for tomorrow post access  2. Anemia - rec'd Feraheme last admission. TSat 21. Give 3 daily doses Ferrlecit. No ESA need w/Hb>10. 3. 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 Continue this 4. Metabolic acidosis - has been on Na bicarb 2TID and CO2 normal. Once starts HD will d/c the sodium bicarb.  5. H/O stroke with residual deficits- neg CT for acute last adm, just old lacunes.  ASA, plavix, statin 6. Depression - sertraline and trazadone started last adm. Continue 7. HTN - appears controlled on current BP regimen. 8. CAD prior PTCA - nitrates   Jamal Maes, MD [facility] [phone] pager 06/27/2017, 12:38 PM

## 2017-06-27 NOTE — Progress Notes (Signed)
   VASCULAR SURGERY ASSESSMENT & PLAN:   For possible left brachiocephalic fistula, basilic vein transposition, or AV graft tomorrow and placement of tunneled dialysis catheter.  I have previously discussed the procedure and potential complications with the patient.  This morning he has no further questions.  SUBJECTIVE:   No complaints.  PHYSICAL EXAM:   Vitals:   06/26/17 1721 06/26/17 2046 06/27/17 0000 06/27/17 0440  BP: (!) 146/79 (!) 151/78  (!) 154/79  Pulse: 64 62  66  Resp: 18 17  17   Temp: 98.1 F (36.7 C) 97.9 F (36.6 C)  97.8 F (36.6 C)  TempSrc: Oral Oral  Oral  SpO2: 92% 94%  96%  Height:   5\' 5"  (1.651 m)    Palpable left radial pulse.  LABS:   Lab Results  Component Value Date   WBC 6.3 06/26/2017   HGB 11.1 (L) 06/26/2017   HCT 34.9 (L) 06/26/2017   MCV 93.6 06/26/2017   PLT 264 06/26/2017   Lab Results  Component Value Date   CREATININE 7.81 (H) 06/27/2017   Lab Results  Component Value Date   INR 1.06 01/11/2016   CBG (last 3)  Recent Labs    06/26/17 1639 06/26/17 2048 06/27/17 0727  GLUCAP 266* 217* 153*    PROBLEM LIST:    Principal Problem:   ESRD (end stage renal disease) on dialysis [facility]) Active Problems:   Type 2 diabetes mellitus with diabetic nephropathy, without long-term current use of insulin (HCC)   Essential hypertension   Hypercholesteremia   Chronic combined systolic and diastolic heart failure (HCC)   Depression   Normocytic anemia   History of completed stroke   CURRENT MEDS:   . amLODipine  10 mg Oral QHS  . aspirin  81 mg Oral Daily  . calcitRIOL  0.25 mcg Oral Daily  . carvedilol  25 mg Oral BID WC  . docusate sodium  100 mg Oral BID  . enoxaparin (LOVENOX) injection  30 mg Subcutaneous Q24H  . hydrALAZINE  100 mg Oral TID  . insulin aspart  0-5 Units Subcutaneous QHS  . insulin aspart  0-9 Units Subcutaneous TID WC  . insulin aspart  3 Units Subcutaneous TID WC  . isosorbide mononitrate  120 mg  Oral Daily  . sertraline  50 mg Oral Daily  . simvastatin  20 mg Oral Daily  . sodium bicarbonate  1,300 mg Oral BID  . sodium chloride flush  3 mL Intravenous Q12H  . traZODone  50 mg Oral QHS    Deitra Mayo Beeper: [phone] Office: [phone] 06/27/2017

## 2017-06-28 ENCOUNTER — Inpatient Hospital Stay (HOSPITAL_COMMUNITY): Payer: BC Managed Care – PPO

## 2017-06-28 ENCOUNTER — Encounter (HOSPITAL_COMMUNITY)
Admission: EM | Disposition: A | Discharge status: Skilled Nursing Facility | Payer: Self-pay | Source: Home / Self Care | Attending: Internal Medicine

## 2017-06-28 ENCOUNTER — Inpatient Hospital Stay (HOSPITAL_COMMUNITY): Payer: BC Managed Care – PPO | Admitting: Anesthesiology

## 2017-06-28 ENCOUNTER — Encounter (HOSPITAL_COMMUNITY): Payer: Self-pay | Admitting: *Deleted

## 2017-06-28 HISTORY — PX: INSERTION OF DIALYSIS CATHETER: SHX1324

## 2017-06-28 HISTORY — PX: BASCILIC VEIN TRANSPOSITION: SHX5742

## 2017-06-28 LAB — RENAL FUNCTION PANEL
ANION GAP: 14 (ref 5–15)
Albumin: 3.2 g/dL — ABNORMAL LOW (ref 3.5–5.0)
BUN: 88 mg/dL — AB (ref 6–20)
CHLORIDE: 94 mmol/L — AB (ref 101–111)
CO2: 27 mmol/L (ref 22–32)
Calcium: 9 mg/dL (ref 8.9–10.3)
Creatinine, Ser: 7.98 mg/dL — ABNORMAL HIGH (ref 0.61–1.24)
GFR calc Af Amer: 8 mL/min — ABNORMAL LOW (ref >60)
GFR, EST NON AFRICAN AMERICAN: 7 mL/min — AB (ref >60)
GLUCOSE: 141 mg/dL — AB (ref 65–99)
Phosphorus: 7.4 mg/dL — ABNORMAL HIGH (ref 2.5–4.6)
Potassium: 5 mmol/L (ref 3.5–5.1)
Sodium: 135 mmol/L (ref 135–145)

## 2017-06-28 LAB — CBC
HCT: 33.8 % — ABNORMAL LOW (ref 39.0–52.0)
Hemoglobin: 10.8 g/dL — ABNORMAL LOW (ref 13.0–17.0)
MCH: 29.9 pg (ref 26.0–34.0)
MCHC: 32 g/dL (ref 30.0–36.0)
MCV: 93.6 fL (ref 78.0–100.0)
Platelets: 278 10*3/uL (ref 150–400)
RBC: 3.61 MIL/uL — ABNORMAL LOW (ref 4.22–5.81)
RDW: 12.8 % (ref 11.5–15.5)
WBC: 7.6 10*3/uL (ref 4.0–10.5)

## 2017-06-28 LAB — GLUCOSE, CAPILLARY
GLUCOSE-CAPILLARY: 152 mg/dL — AB (ref 65–99)
GLUCOSE-CAPILLARY: 169 mg/dL — AB (ref 65–99)
GLUCOSE-CAPILLARY: 197 mg/dL — AB (ref 65–99)
Glucose-Capillary: 294 mg/dL — ABNORMAL HIGH (ref 65–99)

## 2017-06-28 LAB — HEPATITIS B SURFACE ANTIGEN: Hepatitis B Surface Ag: NEGATIVE

## 2017-06-28 LAB — SURGICAL PCR SCREEN
MRSA, PCR: NEGATIVE
Staphylococcus aureus: NEGATIVE

## 2017-06-28 LAB — PROTIME-INR
INR: 1.14
PROTHROMBIN TIME: 14.5 s (ref 11.4–15.2)

## 2017-06-28 SURGERY — TRANSPOSITION, VEIN, BASILIC
Anesthesia: Monitor Anesthesia Care | Site: Chest | Laterality: Right

## 2017-06-28 MED ORDER — ENOXAPARIN SODIUM 30 MG/0.3ML ~~LOC~~ SOLN
30.0000 mg | SUBCUTANEOUS | Status: DC
  Administered 2017-06-29 – 2017-07-04 (×6): 30 mg via SUBCUTANEOUS
  Filled 2017-06-28 (×8): qty 0.3

## 2017-06-28 MED ORDER — HEPARIN SODIUM (PORCINE) 1000 UNIT/ML IJ SOLN
INTRAMUSCULAR | Status: DC | PRN
  Administered 2017-06-28: 3400 [IU]

## 2017-06-28 MED ORDER — PROTAMINE SULFATE 10 MG/ML IV SOLN
INTRAVENOUS | Status: AC
  Filled 2017-06-28: qty 5

## 2017-06-28 MED ORDER — LIDOCAINE 2% (20 MG/ML) 5 ML SYRINGE
INTRAMUSCULAR | Status: AC
  Filled 2017-06-28: qty 5

## 2017-06-28 MED ORDER — SODIUM CHLORIDE 0.9 % IV SOLN
INTRAVENOUS | Status: DC
Start: 2017-06-28 — End: 2017-06-28
  Administered 2017-06-28 (×2): via INTRAVENOUS

## 2017-06-28 MED ORDER — THROMBIN 5000 UNITS EX SOLR
CUTANEOUS | Status: AC
  Filled 2017-06-28: qty 5000

## 2017-06-28 MED ORDER — ONDANSETRON HCL 4 MG/2ML IJ SOLN
INTRAMUSCULAR | Status: DC | PRN
  Administered 2017-06-28: 4 mg via INTRAVENOUS

## 2017-06-28 MED ORDER — FENTANYL CITRATE (PF) 100 MCG/2ML IJ SOLN: 25.0000 ug | INTRAMUSCULAR | Status: DC | PRN

## 2017-06-28 MED ORDER — FENTANYL CITRATE (PF) 100 MCG/2ML IJ SOLN
INTRAMUSCULAR | Status: DC | PRN
  Administered 2017-06-28 (×3): 25 ug via INTRAVENOUS

## 2017-06-28 MED ORDER — THROMBIN (RECOMBINANT) 20000 UNITS EX SOLR
CUTANEOUS | Status: DC | PRN
  Administered 2017-06-28: 20 mL via TOPICAL

## 2017-06-28 MED ORDER — DEXAMETHASONE SODIUM PHOSPHATE 10 MG/ML IJ SOLN
INTRAMUSCULAR | Status: AC
  Filled 2017-06-28: qty 1

## 2017-06-28 MED ORDER — SODIUM CHLORIDE 0.9 % IV SOLN
INTRAVENOUS | Status: DC | PRN
  Administered 2017-06-28: 14:00:00

## 2017-06-28 MED ORDER — OXYCODONE-ACETAMINOPHEN 5-325 MG PO TABS
1.0000 | ORAL_TABLET | Freq: Four times a day (QID) | ORAL | Status: DC | PRN
  Administered 2017-06-28 – 2017-06-30 (×2): 1 via ORAL
  Filled 2017-06-28: qty 1

## 2017-06-28 MED ORDER — MIDAZOLAM HCL 2 MG/2ML IJ SOLN
INTRAMUSCULAR | Status: AC
  Filled 2017-06-28: qty 2

## 2017-06-28 MED ORDER — PROPOFOL 10 MG/ML IV BOLUS
INTRAVENOUS | Status: AC
  Filled 2017-06-28: qty 20

## 2017-06-28 MED ORDER — LIDOCAINE HCL (PF) 1 % IJ SOLN
INTRAMUSCULAR | Status: AC
  Filled 2017-06-28: qty 30

## 2017-06-28 MED ORDER — DEXAMETHASONE SODIUM PHOSPHATE 10 MG/ML IJ SOLN
INTRAMUSCULAR | Status: DC | PRN
  Administered 2017-06-28: 10 mg via INTRAVENOUS

## 2017-06-28 MED ORDER — OXYCODONE HCL 5 MG PO TABS
ORAL_TABLET | ORAL | Status: AC
  Administered 2017-06-28: 5 mg via ORAL
  Filled 2017-06-28: qty 1

## 2017-06-28 MED ORDER — OXYCODONE HCL 5 MG/5ML PO SOLN: 5.0000 mg | Freq: Once | ORAL | Status: DC | PRN

## 2017-06-28 MED ORDER — LIDOCAINE-EPINEPHRINE (PF) 1 %-1:200000 IJ SOLN
INTRAMUSCULAR | Status: AC
  Filled 2017-06-28: qty 30

## 2017-06-28 MED ORDER — FENTANYL CITRATE (PF) 250 MCG/5ML IJ SOLN
INTRAMUSCULAR | Status: AC
  Filled 2017-06-28: qty 5

## 2017-06-28 MED ORDER — LIDOCAINE-EPINEPHRINE (PF) 1 %-1:200000 IJ SOLN
INTRAMUSCULAR | Status: DC | PRN
  Administered 2017-06-28: 58 mL

## 2017-06-28 MED ORDER — 0.9 % SODIUM CHLORIDE (POUR BTL) OPTIME
TOPICAL | Status: DC | PRN
  Administered 2017-06-28: 1000 mL

## 2017-06-28 MED ORDER — OXYCODONE-ACETAMINOPHEN 5-325 MG PO TABS
ORAL_TABLET | ORAL | Status: AC
  Administered 2017-06-28: 1 via ORAL
  Filled 2017-06-28: qty 1

## 2017-06-28 MED ORDER — OXYCODONE HCL 5 MG PO TABS: 5.0000 mg | ORAL_TABLET | Freq: Once | ORAL | Status: DC | PRN

## 2017-06-28 MED ORDER — HEPARIN SODIUM (PORCINE) 1000 UNIT/ML IJ SOLN
INTRAMUSCULAR | Status: AC
  Filled 2017-06-28: qty 1

## 2017-06-28 MED ORDER — PROPOFOL 500 MG/50ML IV EMUL
INTRAVENOUS | Status: DC | PRN
  Administered 2017-06-28: 50 ug/kg/min via INTRAVENOUS

## 2017-06-28 MED ORDER — SODIUM CHLORIDE 0.9 % IV SOLN
INTRAVENOUS | Status: AC
  Filled 2017-06-28: qty 1.2

## 2017-06-28 MED ORDER — ONDANSETRON HCL 4 MG/2ML IJ SOLN: 4.0000 mg | Freq: Once | INTRAMUSCULAR | Status: DC | PRN

## 2017-06-28 MED ORDER — HEPARIN SODIUM (PORCINE) 1000 UNIT/ML IJ SOLN
INTRAMUSCULAR | Status: DC | PRN
  Administered 2017-06-28: 5000 [IU] via INTRAVENOUS

## 2017-06-28 MED ORDER — PROTAMINE SULFATE 10 MG/ML IV SOLN
INTRAVENOUS | Status: DC | PRN
  Administered 2017-06-28 (×3): 10 mg via INTRAVENOUS

## 2017-06-28 MED ORDER — MIDAZOLAM HCL 5 MG/5ML IJ SOLN
INTRAMUSCULAR | Status: DC | PRN
  Administered 2017-06-28: 2 mg via INTRAVENOUS

## 2017-06-28 MED ORDER — OXYCODONE HCL 5 MG PO TABS
5.0000 mg | ORAL_TABLET | Freq: Once | ORAL | Status: AC
  Administered 2017-06-28: 5 mg via ORAL

## 2017-06-28 MED ORDER — PROPOFOL 10 MG/ML IV BOLUS: INTRAVENOUS | Status: DC | PRN

## 2017-06-28 MED ORDER — ONDANSETRON HCL 4 MG/2ML IJ SOLN
INTRAMUSCULAR | Status: AC
  Filled 2017-06-28: qty 2

## 2017-06-28 SURGICAL SUPPLY — 59 items
ARMBAND PINK RESTRICT EXTREMIT (MISCELLANEOUS) ×4 IMPLANT
BAG BANDED W/RUBBER/TAPE 36X54 (MISCELLANEOUS) ×2 IMPLANT
BAG DECANTER FOR FLEXI CONT (MISCELLANEOUS) ×2 IMPLANT
BIOPATCH RED 1 DISK 7.0 (GAUZE/BANDAGES/DRESSINGS) ×3 IMPLANT
BIOPATCH RED 1IN DISK 7.0MM (GAUZE/BANDAGES/DRESSINGS) ×1
CANISTER SUCT 3000ML PPV (MISCELLANEOUS) ×4 IMPLANT
CANNULA VESSEL 3MM 2 BLNT TIP (CANNULA) IMPLANT
CATH PALINDROME RT-P 15FX19CM (CATHETERS) IMPLANT
CATH PALINDROME RT-P 15FX23CM (CATHETERS) ×2 IMPLANT
CATH PALINDROME RT-P 15FX28CM (CATHETERS) IMPLANT
CATH PALINDROME RT-P 15FX55CM (CATHETERS) IMPLANT
CHLORAPREP W/TINT 26ML (MISCELLANEOUS) ×4 IMPLANT
CLIP VESOCCLUDE MED 24/CT (CLIP) ×4 IMPLANT
CLIP VESOCCLUDE SM WIDE 24/CT (CLIP) ×4 IMPLANT
COVER DOME SNAP 22 D (MISCELLANEOUS) ×2 IMPLANT
COVER PROBE W GEL 5X96 (DRAPES) ×2 IMPLANT
COVER SURGICAL LIGHT HANDLE (MISCELLANEOUS) ×4 IMPLANT
DECANTER SPIKE VIAL GLASS SM (MISCELLANEOUS) ×2 IMPLANT
DERMABOND ADVANCED (GAUZE/BANDAGES/DRESSINGS) ×6
DERMABOND ADVANCED .7 DNX12 (GAUZE/BANDAGES/DRESSINGS) ×2 IMPLANT
DRAPE C-ARM 42X72 X-RAY (DRAPES) ×4 IMPLANT
DRAPE CHEST BREAST 15X10 FENES (DRAPES) ×4 IMPLANT
ELECT REM PT RETURN 9FT ADLT (ELECTROSURGICAL) ×4
ELECTRODE REM PT RTRN 9FT ADLT (ELECTROSURGICAL) ×2 IMPLANT
GAUZE SPONGE 4X4 12PLY STRL LF (GAUZE/BANDAGES/DRESSINGS) ×2 IMPLANT
GAUZE SPONGE 4X4 16PLY XRAY LF (GAUZE/BANDAGES/DRESSINGS) ×4 IMPLANT
GLOVE BIO SURGEON STRL SZ7.5 (GLOVE) ×4 IMPLANT
GLOVE BIOGEL PI IND STRL 8 (GLOVE) ×2 IMPLANT
GLOVE BIOGEL PI INDICATOR 8 (GLOVE) ×2
GOWN STRL REUS W/ TWL LRG LVL3 (GOWN DISPOSABLE) ×6 IMPLANT
GOWN STRL REUS W/TWL LRG LVL3 (GOWN DISPOSABLE) ×6
GRAFT GORETEX STRT 4-7X45 (Vascular Products) ×2 IMPLANT
KIT BASIN OR (CUSTOM PROCEDURE TRAY) ×4 IMPLANT
KIT TURNOVER KIT B (KITS) ×4 IMPLANT
NDL 18GX1X1/2 (RX/OR ONLY) (NEEDLE) ×2 IMPLANT
NDL HYPO 25GX1X1/2 BEV (NEEDLE) ×2 IMPLANT
NEEDLE 18GX1X1/2 (RX/OR ONLY) (NEEDLE) ×4 IMPLANT
NEEDLE HYPO 25GX1X1/2 BEV (NEEDLE) ×4 IMPLANT
NS IRRIG 1000ML POUR BTL (IV SOLUTION) ×4 IMPLANT
PACK CV ACCESS (CUSTOM PROCEDURE TRAY) ×4 IMPLANT
PACK SURGICAL SETUP 50X90 (CUSTOM PROCEDURE TRAY) ×4 IMPLANT
PAD ARMBOARD 7.5X6 YLW CONV (MISCELLANEOUS) ×8 IMPLANT
SPONGE SURGIFOAM ABS GEL 100 (HEMOSTASIS) ×2 IMPLANT
SUT ETHILON 3 0 PS 1 (SUTURE) ×4 IMPLANT
SUT PROLENE 6 0 BV (SUTURE) ×12 IMPLANT
SUT SILK 2 0 SH (SUTURE) ×2 IMPLANT
SUT VIC AB 3-0 SH 27 (SUTURE) ×2
SUT VIC AB 3-0 SH 27X BRD (SUTURE) ×2 IMPLANT
SUT VIC AB 4-0 PS2 27 (SUTURE) ×2 IMPLANT
SUT VICRYL 4-0 PS2 18IN ABS (SUTURE) ×4 IMPLANT
SYR 10ML LL (SYRINGE) ×4 IMPLANT
SYR 20CC LL (SYRINGE) ×8 IMPLANT
SYR 5ML LL (SYRINGE) ×8 IMPLANT
SYR CONTROL 10ML LL (SYRINGE) ×4 IMPLANT
TAPE CLOTH SURG 4X10 WHT LF (GAUZE/BANDAGES/DRESSINGS) ×2 IMPLANT
TOWEL GREEN STERILE (TOWEL DISPOSABLE) ×8 IMPLANT
TOWEL GREEN STERILE FF (TOWEL DISPOSABLE) ×4 IMPLANT
UNDERPAD 30X30 (UNDERPADS AND DIAPERS) ×4 IMPLANT
WATER STERILE IRR 1000ML POUR (IV SOLUTION) ×4 IMPLANT

## 2017-06-28 NOTE — Procedures (Signed)
I have personally attended this patient's dialysis session.   HD#1 for new ESRD via [place] (5/24 Scot Dock) New L upper arm AVG sounds great, hand warm 2.5 hours 1st TMT 2K bath CLIP pending  Jamal Maes, MD [place] Pager 06/28/2017, 7:00 PM

## 2017-06-28 NOTE — Anesthesia Procedure Notes (Signed)
Procedure Name: MAC Date/Time: 06/28/2017 1:40 PM Performed by: Jenne Campus, CRNA Pre-anesthesia Checklist: Patient identified, Emergency Drugs available, Suction available and Patient being monitored Oxygen Delivery Method: Nasal cannula

## 2017-06-28 NOTE — Progress Notes (Signed)
[name]  Subjective/Interval History:  TDC and left upper AVG by Dr. Scot Dock today 5/24 Seen in HD getting 1st TMT  Objective Vital signs in last 24 hours: Vitals:   06/28/17 1720 06/28/17 1730 06/28/17 1800 06/28/17 1830  BP: (!) 151/77 (!) 157/73 129/72 134/73  Pulse: 69 69 78 73  Resp: 15     Temp: 98 F (36.7 C)     TempSrc:      SpO2: 90%     Weight: 55.7 kg (122 lb 12.7 oz)     Height:       Weight change:   Intake/Output Summary (Last 24 hours) at 06/28/2017 1903 Last data filed at 06/28/2017 1538 Gross per 24 hour  Intake 620 ml  Output 510 ml  Net 110 ml   Physical Exam:  Blood pressure 134/73, pulse 73, temperature 98 F (36.7 C), resp. rate 15, height 5\' 5"  (1.651 m), weight 55.7 kg (122 lb 12.7 oz), SpO2 90 %.  Small framed Hispanic man NAD VS as noted No JVD Lungs clear no crackles or wheezes S1S2 No S3 Soft systolic murmur LSB Abd soft and not tender No edema of LE's R IJ TDC and L upper AVG (both today Dr. Scot Dock)  Recent Labs  Lab 06/25/17 1321 06/26/17 0325 06/27/17 0632 06/28/17 0651  NA 131* 136 134* 135  K 5.2* 4.9 4.5 5.0  CL 90* 94* 93* 94*  CO2 28 26 27 27   GLUCOSE 321* 148* 167* 141*  BUN 92* 96* 90* 88*  CREATININE 7.89* 8.09* 7.81* 7.98*  CALCIUM 9.1 9.4 9.1 9.0  PHOS  --  6.8* 6.8* 7.4*    Recent Labs  Lab 06/26/17 0325 06/27/17 0632 06/28/17 0651  ALBUMIN 3.2* 3.1* 3.2*    Recent Labs  Lab 06/25/17 1321 06/26/17 0325 06/28/17 0651  WBC 6.2 6.3 7.6  NEUTROABS 4.6  --   --   HGB 10.5* 11.1* 10.8*  HCT 32.9* 34.9* 33.8*  MCV 92.7 93.6 93.6  PLT 278 264 278    Recent Labs  Lab 06/27/17 1618 06/27/17 2053 06/28/17 0809 06/28/17 1145 06/28/17 1651  GLUCAP 104* 333* 152* 169* 197*     Recent Labs  Lab 06/25/17 1650  IRON 48  TIBC 228*  FERRITIN 155   Medications . ferric gluconate (FERRLECIT/NULECIT) IV Stopped (06/27/17 1057)   . amLODipine  10 mg Oral QHS  . aspirin  81 mg Oral Daily   . calcitRIOL  0.25 mcg Oral Daily  . carvedilol  25 mg Oral BID WC  . docusate sodium  100 mg Oral BID  . [START ON 06/29/2017] enoxaparin (LOVENOX) injection  30 mg Subcutaneous Q24H  . hydrALAZINE  100 mg Oral TID  . insulin aspart  0-5 Units Subcutaneous QHS  . insulin aspart  0-9 Units Subcutaneous TID WC  . insulin aspart  3 Units Subcutaneous TID WC  . isosorbide mononitrate  120 mg Oral Daily  . sertraline  50 mg Oral Daily  . simvastatin  20 mg Oral Daily  . sodium bicarbonate  1,300 mg Oral BID  . sodium chloride flush  3 mL Intravenous Q12H  . 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 Was here 4/23-06/09/17 for weakness, neurovegetative sx. Discharged to SNF d/t inability to care for self or live alone, residing at [facility].  Initial renal eval done 05/2017 for creatinine of 5.8. All  serologies were negative. 4.3 gm proteinuria. Had been taking NSAIDS so hope was for some possible recovery. Seen by Dr. Carolin Sicks at our office 06/21/17 for F/U of advanced CKD. Creatinine was up to 7.54, BUN 95, K 5.4.Sent to ED by [facility]. TDC/L upper AVG 5.24 Dr. Scot Dock. HD initiated 5.24.19.  Assessment/Recommendations  1. 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 Had twin with ESRD. Only minimal uremic sx but needs to move forward with access etc more expeditiously than can be done as outpt.  1. Vein mapping was done 06/04/17 (last admit)  2. TDC and AVF AVG done 5.24 Dr. Scot Dock 3. HD#1 5/24, #2 for 5/25 4. CLIP pending 2. Anemia - rec'd Feraheme last admission. TSat 21. Giving 3 daily doses Ferrlecit. No ESA need w/Hb>10. 3. 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 Continue this daily pending outpt HD spot - then consolidate to give on HD days 4. Metabolic acidosis - has been on Na bicarb 2TID and CO2 normal. Stop 5. H/O stroke with residual deficits- neg CT for acute last adm, just old lacunes. ASA, plavix, statin 6. Depression - sertraline  and trazadone started last adm. Continue 7. HTN - appears controlled on current BP regimen. 8. CAD prior PTCA - nitrates   Jamal Maes, MD [facility] [phone] pager 06/28/2017, 7:03 PM

## 2017-06-28 NOTE — Transfer of Care (Signed)
Immediate Anesthesia Transfer of Care Note  Patient: Verdun Rackley  Procedure(s) Performed: LEFT UPPER ARM ARTERIOVENOUS GORTEX-GRAFT PLACEMENT (Left Arm Upper) INSERTION OF DIALYSIS CATHETER RIGHT INTERNAL JUGULAR (Right Chest)  Patient Location: PACU  Anesthesia Type:MAC  Level of Consciousness: awake, oriented and patient cooperative  Airway & Oxygen Therapy: Patient Spontanous Breathing and Patient connected to face mask oxygen  Post-op Assessment: Report given to RN and Post -op Vital signs reviewed and stable  Post vital signs: Reviewed and stable  Last Vitals:  Vitals Value Taken Time  BP 142/72 06/28/2017  4:49 PM  Temp    Pulse 65 06/28/2017  4:54 PM  Resp 16 06/28/2017  4:54 PM  SpO2 90 % 06/28/2017  4:54 PM  Vitals shown include unvalidated device data.  Last Pain:  Vitals:   06/28/17 1649  TempSrc:   PainSc: (P) 0-No pain         Complications: No apparent anesthesia complications

## 2017-06-28 NOTE — Anesthesia Preprocedure Evaluation (Addendum)
Anesthesia Evaluation  Patient identified by MRN, date of birth, ID band Patient awake    Reviewed: Allergy & Precautions, NPO status , Patient's Chart, lab work & pertinent test results  Airway Mallampati: II  TM Distance: >3 FB Neck ROM: Full    Dental  (+) Edentulous Upper, Edentulous Lower, Dental Advisory Given   Pulmonary former smoker,    breath sounds clear to auscultation       Cardiovascular hypertension, + CAD   Rhythm:Regular Rate:Normal     Neuro/Psych    GI/Hepatic   Endo/Other  diabetes  Renal/GU Renal disease     Musculoskeletal   Abdominal   Peds  Hematology   Anesthesia Other Findings   Reproductive/Obstetrics                            Anesthesia Physical Anesthesia Plan  ASA: III  Anesthesia Plan: MAC   Post-op Pain Management:    Induction: Intravenous  PONV Risk Score and Plan:   Airway Management Planned: Natural Airway and Simple Face Mask  Additional Equipment:   Intra-op Plan:   Post-operative Plan:   Informed Consent: I have reviewed the patients History and Physical, chart, labs and discussed the procedure including the risks, benefits and alternatives for the proposed anesthesia with the patient or authorized representative who has indicated his/her understanding and acceptance.     Plan Discussed with: CRNA and Anesthesiologist  Anesthesia Plan Comments:         Anesthesia Quick Evaluation

## 2017-06-28 NOTE — Op Note (Signed)
NAME: Jeffery Young    MRN: [id number] DOB: 1965/05/15    DATE OF OPERATION: 06/28/2017  PREOP DIAGNOSIS:    End-stage renal disease  POSTOP DIAGNOSIS:    Same  PROCEDURE:    1.  Ultrasound-guided placement of right IJ 23 cm tunneled dialysis catheter 2.  New left upper arm AV graft (4-7 mm PTFE graft)  SURGEON: Judeth Cornfield. Scot Dock, MD, FACS  ASSIST: Leontine Locket, PA  ANESTHESIA: Local with sedation  EBL: Minimal  INDICATIONS:    Jeffery Young is a 52 y.o. male who presents for new access.  FINDINGS:   The upper arm cephalic vein was small and somewhat sclerotic.  There was also thrombus at the antecubital level.  The basilic vein was small and also somewhat sclerotic.  The brachial veins at the antecubital level were small.  Therefore placed an upper arm graft.  TECHNIQUE:   The patient was taken to the operating room and sedated by anesthesia.  The neck and upper chest were prepped and draped in usual sterile fashion.  Under ultrasound guidance, after the skin was anesthetized, the right IJ was cannulated and a wire introduced under fluoroscopic control.  The tract over the wire was dilated and then the dilator and peel-away sheath were advanced over the wire and the wire and dilator removed.  The 23 cm catheter was passed through the peel-away sheath and positioned in the right atrium.  The exit site for the catheter was selected and the skin anesthetized between the 2 areas.  The catheter was brought to the tunnel cut to the appropriate length and the distal ports were attached.  Both ports withdrew easily with and flushed with heparinized saline and filled with concentrated heparin.  The catheter was secured at its exit site with a 3-0 nylon suture.  The IJ cannulation site was closed with a 4-0 subcuticular stitch.  Sterile dressing was applied.  Attention was then turned to the left arm.  I looked at the veins with the SonoSite and I do not think it was a good  candidate for fistula as described above.  After the skin was anesthetized with 1% lidocaine, a longitudinal incision was made just above the antecubital level.  Here the brachial artery was dissected free.  The veins here were small.  I therefore elected to place an upper arm graft.  A separate longitudinal incision was made beneath the axilla after the skin was anesthetized.  Here the high brachial vein was dissected free.  A 4-7 mm PTFE graft was tunneled between the 2 incisions and the patient was heparinized.  The brachial artery was clamped proximally distally and a longitudinal arteriotomy was made.  A segment of the 4 mm the graft was excised, the graft spatulated and sewn into side to the artery using continuous 6-0 Prolene suture.  The graft then pulled the appropriate length for anastomosis to the high brachial vein.  The vein was ligated distally and spatulated proximally.  The graft was cut the appropriate length, spatulated and sewn into into the vein using 2 continuous 6-0 Prolene sutures.  At the completion there was an excellent thrill in the graft and a palpable radial pulse.  Hemostasis was obtained in the wounds.  Each of the wounds was closed with 2 deep layers of 3-0 Vicryl and the skin closed with 4-0 Vicryl.  Dermabond was applied.  The patient tolerated the procedure well was transferred to the recovery room in stable condition.  All needle and  sponge counts were correct.  Deitra Mayo, MD, FACS [facility]  DATE OF DICTATION:   06/28/2017

## 2017-06-28 NOTE — Progress Notes (Signed)
Patient arrived to unit by bed from PACU.  Reviewed treatment plan and this RN agrees with plan.  Report received from PACU RN.  Consent obtained.  Patient A & O X 4.   Lung sounds diminished to ausculation in all fields. Generalized non pitting edema. Cardiac:  NSR.  Removed caps and cleansed RIJ catheter with chlorhedxidine.  Aspirated ports of heparin and flushed them with saline per protocol.  Connected and secured lines, initiated treatment at 1730.  UF Goal of 500 mL and net fluid removal 0 L.  Will continue to monitor.

## 2017-06-28 NOTE — Progress Notes (Signed)
Dialysis treatment completed.  500 mL ultrafiltrated.  0 mL net fluid removal.  Patient status unchanged. Lung sounds diminished and clear to ausculation in all fields. Generalized edema. Cardiac: NSR.  Cleansed RIJ catheter with chlorhexidine.  Disconnected lines and flushed ports with saline per protocol.  Ports locked with heparin and capped per protocol.    Report given to bedside, RN Maudie Mercury.

## 2017-06-28 NOTE — Progress Notes (Signed)
[facility]                                                                              Patient Demographics  Jeffery Young, is a 52 y.o. male, DOB - 1965-03-04, ZOX:[id number]  Admit date - 06/25/2017   Admitting Physician Jonah Blue, MD  Outpatient Primary MD for the patient is Knox Royalty, MD  Outpatient specialists:   LOS - 3  days   Medical records reviewed and are as summarized below:    No chief complaint on file.      Brief summary    Jeffery Young is a 52 y.o. male with medical history significant of DM; HTN; HLD; and CAD, CKD, history of TIAs, presented with generalized weakness, terms of uremia, worsening creatinine. He does still make urine - voids about 3-4 times daily.  This time, he was having ongoing fatigue and he saw nephrology; they suggested he come in to start HD. He was previously hospitalized from 4/23-5/5 for acute renal failure on CKD with progression to Stage V CKD.  He has had vein mapping performed and was planning to have access placement as an outpatient.    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) (HCC) -Vein mapping done on 05/25/2017, nephrology and vascular surgery consulted, plan for [facility] placement, left arm AV fistula versus graft placement this week  -Will need to be placed on clip process once HD is initiated -Underwent graft placement today, will be started on HD  Active Problems:   Type 2 diabetes mellitus with diabetic nephropathy, without long-term current use of insulin (HCC), uncontrolled with hyperglycemia -Continue sliding scale insulin, NovoLog meal coverage, CBGs improving -Hemoglobin A1c 6.9 on 4/24     Essential hypertension -Continue Coreg, hydralazine.  BP expected to improve after the HD initiated     Hypercholesteremia  - continue simvastatin    Chronic combined systolic and diastolic heart failure (HCC) -Echo 1/18 showed preserved EF,  grade 1 diastolic dysfunction    Depression Continue Zoloft    Normocytic anemia likely anemia of chronic kidney disease Patient is stable  History of CVA -Continue aspirin, Plavix, Imdur, statin, stable    Code Status: Full CODE STATUS DVT Prophylaxis: Lovenox Family Communication: Discussed in detail with the patient, all imaging results, lab results explained to the patient    Disposition Plan: Once cleared by nephrology  Time Spent in minutes 25-minutes  Procedures:  None  Consultants:   Nephrology Vascular surgery  Antimicrobials:      Medications  Scheduled Meds: . [MAR Hold] amLODipine  10 mg Oral QHS  . [MAR Hold] aspirin  81 mg Oral Daily  . [MAR Hold] calcitRIOL  0.25 mcg Oral Daily  . [MAR Hold] carvedilol  25 mg Oral BID WC  . [MAR Hold] docusate sodium  100 mg Oral BID  . [MAR Hold] enoxaparin (LOVENOX) injection  30 mg Subcutaneous Q24H  . [MAR Hold] hydrALAZINE  100 mg Oral TID  . [MAR Hold] insulin aspart  0-5 Units Subcutaneous QHS  . [MAR Hold] insulin aspart  0-9 Units Subcutaneous  TID WC  . [MAR Hold] insulin aspart  3 Units Subcutaneous TID WC  . [MAR Hold] isosorbide mononitrate  120 mg Oral Daily  . [MAR Hold] sertraline  50 mg Oral Daily  . [MAR Hold] simvastatin  20 mg Oral Daily  . [MAR Hold] sodium bicarbonate  1,300 mg Oral BID  . [MAR Hold] sodium chloride flush  3 mL Intravenous Q12H  . [MAR Hold] traZODone  50 mg Oral QHS   Continuous Infusions: . sodium chloride 10 mL/hr at 06/28/17 1218  . [MAR Hold]  ceFAZolin (ANCEF) IV    . ferric gluconate (FERRLECIT/NULECIT) IV Stopped (06/27/17 1057)   PRN Meds:.[MAR Hold] acetaminophen **OR** [MAR Hold] acetaminophen, [MAR Hold] calcium carbonate (dosed in mg elemental calcium), [MAR Hold] camphor-menthol **AND** [MAR Hold] hydrOXYzine, [MAR Hold] docusate sodium, [MAR Hold] feeding supplement (NEPRO CARB STEADY), [MAR Hold] ondansetron **OR** [MAR Hold] ondansetron (ZOFRAN) IV, [MAR  Hold] sorbitol, [MAR Hold] zolpidem   Antibiotics   Anti-infectives (From admission, onward)   Start     Dose/Rate Route Frequency Ordered Stop   06/28/17 0600  [MAR Hold]  ceFAZolin (ANCEF) IVPB 1 g/50 mL premix     (MAR Hold since Fri 06/28/2017 at 1207. Reason: Transfer to a Procedural area.)  Note to Pharmacy:  Send with pt to OR   1 g 100 mL/hr over 30 Minutes Intravenous To ShortStay Surgical 06/26/17 0814 06/29/17 0600   06/28/17 0000  ceFAZolin (ANCEF) IVPB 1 g/50 mL premix  Status:  Discontinued    Note to Pharmacy:  Send with pt to OR   1 g 100 mL/hr over 30 Minutes Intravenous On call 06/27/17 0815 06/27/17 1610        Subjective:   Jeffery Young was seen and examined today.  No complaints, flat affect, no chest pain or shortness of breath . Patient denies dizziness, chest pain, shortness of breath, abdominal pain, N/V/D/C.  No acute events overnight Objective:   Vitals:   06/27/17 1723 06/27/17 2053 06/28/17 1030 06/28/17 1212  BP: (!) 162/76 (!) 152/71 131/73   Pulse: 62 63 62   Resp: 16 18 17    Temp: 98.9 F (37.2 C) 98.3 F (36.8 C) 98.2 F (36.8 C)   TempSrc: Oral  Oral   SpO2: 94% 95% 91%   Weight:    55.3 kg (122 lb)  Height:    5\' 5"  (1.651 m)    Intake/Output Summary (Last 24 hours) at 06/28/2017 1342 Last data filed at 06/28/2017 1100 Gross per 24 hour  Intake 120 ml  Output 500 ml  Net -380 ml     Wt Readings from Last 3 Encounters:  06/28/17 55.3 kg (122 lb)  06/09/17 55.7 kg (122 lb 12.8 oz)  11/20/16 58.9 kg (129 lb 12.8 oz)     Exam  General: Alert and oriented x 3, NAD Eyes: HEENT:  Atraumatic, normocephalic Cardiovascular: S1 S2 auscultated,  Regular rate and rhythm. No pedal edema b/l Respiratory: Clear to auscultation bilaterally, no wheezing, rales or rhonchi Gastrointestinal: Soft, nontender, nondistended, + bowel sounds Ext: no pedal edema bilaterally Neuro: no new deficit Musculoskeletal: No digital cyanosis,  clubbing Skin: No rashes Psych: flat affect     Data Reviewed:  I have personally reviewed following labs and imaging studies  Micro Results Recent Results (from the past 240 hour(s))  Surgical pcr screen     Status: None   Collection Time: 06/28/17  7:00 AM  Result Value Ref Range Status   MRSA, PCR NEGATIVE  NEGATIVE Final   Staphylococcus aureus NEGATIVE NEGATIVE Final    Comment: (NOTE) The Xpert SA Assay (FDA approved for NASAL specimens in patients 52 years of age and older), is one component of a comprehensive surveillance program. It is not intended to diagnose infection nor to guide or monitor treatment. Performed at [facility], [address], [place], [place] [address]     Radiology Reports No results found.  Lab Data:  CBC: Recent Labs  Lab 06/25/17 1321 06/26/17 0325 06/28/17 0651  WBC 6.2 6.3 7.6  NEUTROABS 4.6  --   --   HGB 10.5* 11.1* 10.8*  HCT 32.9* 34.9* 33.8*  MCV 92.7 93.6 93.6  PLT 278 264 278   Basic Metabolic Panel: Recent Labs  Lab 06/25/17 1321 06/26/17 0325 06/27/17 0632 06/28/17 0651  NA 131* 136 134* 135  K 5.2* 4.9 4.5 5.0  CL 90* 94* 93* 94*  CO2 28 26 27 27   GLUCOSE 321* 148* 167* 141*  BUN 92* 96* 90* 88*  CREATININE 7.89* 8.09* 7.81* 7.98*  CALCIUM 9.1 9.4 9.1 9.0  MG 2.7*  --   --   --   PHOS  --  6.8* 6.8* 7.4*   GFR: Estimated Creatinine Clearance: 8.6 mL/min (A) (by C-G formula based on SCr of 7.98 mg/dL (H)). Liver Function Tests: Recent Labs  Lab 06/26/17 0325 06/27/17 1914 06/28/17 0651  ALBUMIN 3.2* 3.1* 3.2*   No results for input(s): LIPASE, AMYLASE in the last 168 hours. No results for input(s): AMMONIA in the last 168 hours. Coagulation Profile: Recent Labs  Lab 06/28/17 0651  INR 1.14   Cardiac Enzymes: No results for input(s): CKTOTAL, CKMB, CKMBINDEX, TROPONINI in the last 168 hours. BNP (last 3 results) No results for input(s): PROBNP in the last 8760 hours. HbA1C: No results  for input(s): HGBA1C in the last 72 hours. CBG: Recent Labs  Lab 06/27/17 1139 06/27/17 1618 06/27/17 2053 06/28/17 0809 06/28/17 1145  GLUCAP 162* 104* 333* 152* 169*   Lipid Profile: No results for input(s): CHOL, HDL, LDLCALC, TRIG, CHOLHDL, LDLDIRECT in the last 72 hours. Thyroid Function Tests: No results for input(s): TSH, T4TOTAL, FREET4, T3FREE, THYROIDAB in the last 72 hours. Anemia Panel: Recent Labs    06/25/17 1650  VITAMINB12 524  FOLATE 23.5  FERRITIN 155  TIBC 228*  IRON 48  RETICCTPCT 1.3   Urine analysis:    Component Value Date/Time   COLORURINE YELLOW 06/25/2017 1300   APPEARANCEUR CLEAR 06/25/2017 1300   LABSPEC 1.009 06/25/2017 1300   PHURINE 7.0 06/25/2017 1300   GLUCOSEU >=500 (A) 06/25/2017 1300   HGBUR NEGATIVE 06/25/2017 1300   BILIRUBINUR NEGATIVE 06/25/2017 1300   KETONESUR NEGATIVE 06/25/2017 1300   PROTEINUR 100 (A) 06/25/2017 1300   UROBILINOGEN 1.0 08/21/2012 2250   NITRITE NEGATIVE 06/25/2017 1300   LEUKOCYTESUR NEGATIVE 06/25/2017 1300     Jeffery Young M.D. [facility] 06/28/2017, 1:42 PM  Pager: [phone] Between 7am to 7pm - call Pager - [phone]  After 7pm go to [email or web address] - password [email or web address]  Call night coverage person covering after 7pm

## 2017-06-28 NOTE — Clinical Social Work Note (Signed)
CSW received consult for SNF placement as patient from [facility]. Patient's nurse informed that PT/OT evaluations needed and assessment completed. CSW will continue to follow and initiate SNF search once PT/OT evaluations completed.    Samie Reasons Givens, MSW, LCSW Licensed Clinical Social Worker [place] [phone]

## 2017-06-28 NOTE — Anesthesia Postprocedure Evaluation (Signed)
Anesthesia Post Note  Patient: Jeffery Young  Procedure(s) Performed: LEFT UPPER ARM ARTERIOVENOUS GORTEX-GRAFT PLACEMENT (Left Arm Upper) INSERTION OF DIALYSIS CATHETER RIGHT INTERNAL JUGULAR (Right Chest)     Patient location during evaluation: PACU Anesthesia Type: MAC Level of consciousness: awake and alert Pain management: pain level controlled Vital Signs Assessment: post-procedure vital signs reviewed and stable Respiratory status: spontaneous breathing, nonlabored ventilation, respiratory function stable and patient connected to nasal cannula oxygen Cardiovascular status: blood pressure returned to baseline and stable Postop Assessment: no apparent nausea or vomiting Anesthetic complications: no    Last Vitals:  Vitals:   06/28/17 2000 06/28/17 2005  BP: 124/70 (!) 146/66  Pulse: 87 84  Resp: (!) 21   Temp: 36.4 C   SpO2:      Last Pain:  Vitals:   06/28/17 2000  TempSrc:   PainSc: 2                  [name] COKER

## 2017-06-28 NOTE — Interval H&P Note (Signed)
History and Physical Interval Note:  06/28/2017 12:49 PM  Jeffery Young  has presented today for surgery, with the diagnosis of end stage renal disease  The various methods of treatment have been discussed with the patient and family. After consideration of risks, benefits and other options for treatment, the patient has consented to  Procedure(s): BASILIC VEIN TRANSPOSITION VERSUS GRAFT PLACEMENT (Left) INSERTION OF DIALYSIS CATHETER (N/A) as a surgical intervention .  The patient's history has been reviewed, patient examined, no change in status, stable for surgery.  I have reviewed the patient's chart and labs.  Questions were answered to the patient's satisfaction.     Deitra Mayo

## 2017-06-28 NOTE — Clinical Social Work Note (Signed)
Clinical Social Work Assessment  Patient Details  Name: Jeffery Young MRN: [id number] Date of Birth: November 05, 1965  Date of referral:  06/28/17               Reason for consult:  Discharge Planning                Permission sought to share information with:  Family Supports Permission granted to share information::  Yes, Verbal Permission Granted  Name::     Jeffery Young  Agency::     Relationship::  Wife  Contact Information:  [phone]  Housing/Transportation Living arrangements for the past 2 months:  Skilled Nursing Facility(Patient was discharged to [facility] on 06/09/17. Prior to this patient was at home.) Source of Information:  Patient, Spouse Patient Interpreter Needed:  None Criminal Activity/Legal Involvement Pertinent to Current Situation/Hospitalization:  No - Comment as needed Significant Relationships:  Spouse Lives with:  Facility Resident(Patient came to hospital from [facility] ([facility])) Do you feel safe going back to the place where you live?  Yes Need for family participation in patient care:  Yes (Comment)  Care giving concerns:  When asked, patient reported that his d/c plan was to go home with his wife, however he has not talked with her yet.  Social Worker assessment / plan:  CSW talked with patient at the bedside regarding his discharge plan. Jeffery Young was lying in bed and was awake and alert. He indicated that he would be going home with his wife, but his tone suggested that this may not be the case, as when asked, patient replied that he has not talked with his wife (they are currently separated). 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 He advised CSW that he is currently on FMLA.   Jeffery Young gave CSW permission to talk with his wife. Talked with wife later by phone and she reported that they are not together and her husband cannot return to her home.  Wife indicated that patient needs to return to [facility] to continue  his treatments (rehab) and CSW explained process for patient to return to SNF. Jeffery Young informed CSW that she is currently out of town for the weekend, however she can be contacted by phone again if needed.  Employment status:  Other (Comment)(Patient employed at [employer]) Insurance information:  Managed Care([employer] PPO) PT Recommendations:  Not assessed at this time Information / Referral to community resources:  Other (Comment Required)(None provided to date as patient from a skilled facility)  Patient/Family's Response to care:  Patient reported no concerns regarding his care at [facility].  Patient/Family's Understanding of and Emotional Response to Diagnosis, Current Treatment, and Prognosis:  Jeffery Young feels that patient still needs more rehab and needs to return to [facility].   Emotional Assessment Appearance:  Appears stated age Attitude/Demeanor/Rapport:  Other(Appropriate) Affect (typically observed):  Pleasant, Appropriate Orientation:  Oriented to Self, Oriented to Situation, Oriented to Place, Oriented to  Time Alcohol / Substance use:  Tobacco Use, Alcohol Use, Illicit Drugs(Patient reported that he quit smoking, is not ccurrently drinking and is not using illicit drugs) Psych involvement (Current and /or in the community):  No (Comment)  Discharge Needs  Concerns to be addressed:  Discharge Planning Concerns Readmission within the last 30 days:  Yes Current discharge risk:  Other(Unclear if patient has housing if he does not return to SNF) Barriers to Discharge:  Continued Medical Work  up   Sable Feil, LCSW 06/28/2017, 3:39 PM

## 2017-06-29 LAB — HEPATITIS B SURFACE ANTIBODY,QUALITATIVE: HEP B S AB: NONREACTIVE

## 2017-06-29 LAB — RENAL FUNCTION PANEL
ALBUMIN: 2.6 g/dL — AB (ref 3.5–5.0)
ANION GAP: 12 (ref 5–15)
BUN: 67 mg/dL — ABNORMAL HIGH (ref 6–20)
CHLORIDE: 95 mmol/L — AB (ref 101–111)
CO2: 26 mmol/L (ref 22–32)
Calcium: 8.1 mg/dL — ABNORMAL LOW (ref 8.9–10.3)
Creatinine, Ser: 6.43 mg/dL — ABNORMAL HIGH (ref 0.61–1.24)
GFR calc Af Amer: 10 mL/min — ABNORMAL LOW (ref >60)
GFR calc non Af Amer: 9 mL/min — ABNORMAL LOW (ref >60)
GLUCOSE: 361 mg/dL — AB (ref 65–99)
PHOSPHORUS: 6.2 mg/dL — AB (ref 2.5–4.6)
POTASSIUM: 4.9 mmol/L (ref 3.5–5.1)
Sodium: 133 mmol/L — ABNORMAL LOW (ref 135–145)

## 2017-06-29 LAB — CBC
HCT: 29.4 % — ABNORMAL LOW (ref 39.0–52.0)
HEMOGLOBIN: 9.1 g/dL — AB (ref 13.0–17.0)
MCH: 29.4 pg (ref 26.0–34.0)
MCHC: 31 g/dL (ref 30.0–36.0)
MCV: 95.1 fL (ref 78.0–100.0)
Platelets: 210 10*3/uL (ref 150–400)
RBC: 3.09 MIL/uL — AB (ref 4.22–5.81)
RDW: 12.9 % (ref 11.5–15.5)
WBC: 10.5 10*3/uL (ref 4.0–10.5)

## 2017-06-29 LAB — GLUCOSE, CAPILLARY
GLUCOSE-CAPILLARY: 121 mg/dL — AB (ref 65–99)
GLUCOSE-CAPILLARY: 190 mg/dL — AB (ref 65–99)
Glucose-Capillary: 225 mg/dL — ABNORMAL HIGH (ref 65–99)

## 2017-06-29 LAB — HEPATITIS B CORE ANTIBODY, TOTAL: Hep B Core Total Ab: NEGATIVE

## 2017-06-29 MED ORDER — DARBEPOETIN ALFA 100 MCG/0.5ML IJ SOSY: 100.0000 ug | PREFILLED_SYRINGE | Freq: Once | INTRAMUSCULAR | Status: DC

## 2017-06-29 MED ORDER — CLOPIDOGREL BISULFATE 75 MG PO TABS
75.0000 mg | ORAL_TABLET | Freq: Every day | ORAL | Status: DC
  Administered 2017-06-29 – 2017-07-04 (×6): 75 mg via ORAL
  Filled 2017-06-29 (×6): qty 1

## 2017-06-29 MED ORDER — DARBEPOETIN ALFA 100 MCG/0.5ML IJ SOSY
PREFILLED_SYRINGE | INTRAMUSCULAR | Status: AC
  Administered 2017-06-29: 100 ug via INTRAMUSCULAR
  Filled 2017-06-29: qty 0.5

## 2017-06-29 MED ORDER — DARBEPOETIN ALFA 100 MCG/0.5ML IJ SOSY: 100.0000 ug | PREFILLED_SYRINGE | INTRAMUSCULAR | Status: DC

## 2017-06-29 MED ORDER — CLOPIDOGREL BISULFATE 75 MG PO TABS: 150.0000 mg | ORAL_TABLET | Freq: Every day | ORAL | Status: DC

## 2017-06-29 NOTE — Procedures (Signed)
I have personally attended this patient's dialysis session. HD#2 for new ESRD 3 hours 300/600 2K bath Post weight 56 kg will prob be EDW Well tolerated  Jamal Maes, MD [place] Pager 06/29/2017, 11:10 AM

## 2017-06-29 NOTE — Evaluation (Signed)
Physical Therapy Evaluation Patient Details Name: Jeffery Young MRN: [id number] DOB: October 02, 1965 Today's Date: 06/29/2017   History of Present Illness  Pt. is a 52 y.o. M with significant PMH of DM, HTN, HLD, and CAD, recently hospitalized for a stroke. Now presenting with progressive renal failure and started on HD; recent L arm AVG placement.   Clinical Impression  Pt admitted with above diagnosis. Pt currently with functional limitations due to the deficits listed below (see PT Problem List). Patient is from SNF and reports that staff has recently not allowed him to walk with the RW due to multiple falls. Patient currently ambulating with RW and minimal assistance. Continues to present with left foot drag secondary to ankle dorsiflexion weakness. Could benefit from orthotics consult for AFO. Pt will benefit from skilled PT to increase their independence and safety with mobility to allow discharge to the venue listed below.       Follow Up Recommendations SNF    Equipment Recommendations       Recommendations for Other Services       Precautions / Restrictions Precautions Precautions: Fall Precaution Comments: LUE restrictions (recent AVG placement) Restrictions Weight Bearing Restrictions: No      Mobility  Bed Mobility Overal bed mobility: Needs Assistance Bed Mobility: Supine to Sit     Supine to sit: Supervision        Transfers Overall transfer level: Needs assistance Equipment used: Rolling walker (2 wheeled) Transfers: Sit to/from Stand Sit to Stand: Min guard         General transfer comment: min guard for balance  Ambulation/Gait Ambulation/Gait assistance: Min assist   Assistive device: Rolling walker (2 wheeled) Gait Pattern/deviations: Decreased stride length;Decreased step length - left;Decreased stance time - left;Decreased weight shift to left;Decreased dorsiflexion - left     General Gait Details: Ambulated from bed to chair with RW. L foot  drag requiring min assist for balance.   Stairs            Wheelchair Mobility    Modified Rankin (Stroke Patients Only)       Balance Overall balance assessment: Needs assistance Sitting-balance support: No upper extremity supported;Feet supported Sitting balance-Leahy Scale: Good     Standing balance support: Bilateral upper extremity supported Standing balance-Leahy Scale: Poor                               Pertinent Vitals/Pain Pain Assessment: No/denies pain    Home Living Family/patient expects to be discharged to:: Skilled nursing facility                      Prior Function Level of Independence: Needs assistance         Comments: From SNF. Reports they have not been letting him use RW to ambulate recently due to multiple falls.     Hand Dominance        Extremity/Trunk Assessment   Upper Extremity Assessment Upper Extremity Assessment: RUE deficits/detail;LUE deficits/detail RUE Deficits / Details: MMT: shoulder flexion 3+/5, elbow flexion/extension 4/5 LUE Deficits / Details: recent AVG placement. At least antigravity throughout    Lower Extremity Assessment Lower Extremity Assessment: RLE deficits/detail;LLE deficits/detail RLE Deficits / Details: WFL except hip flexion 4/5 LLE Deficits / Details: MMT: hip flexion 3+/5, knee ext/flex 4/5, ankle dorsiflexion 2/5 LLE Sensation: decreased light touch       Communication   Communication: No difficulties  Cognition Arousal/Alertness: Awake/alert  Behavior During Therapy: Flat affect Overall Cognitive Status: Impaired/Different from baseline Area of Impairment: Attention;Following commands;Safety/judgement;Awareness;Problem solving;Memory;Orientation                 Orientation Level: Time;Disoriented to Current Attention Level: Selective Memory: Decreased short-term memory Following Commands: Follows one step commands consistently Safety/Judgement: Decreased  awareness of safety;Decreased awareness of deficits Awareness: Emergent Problem Solving: Decreased initiation;Requires verbal cues;Requires tactile cues        General Comments      Exercises     Assessment/Plan    PT Assessment Patient needs continued PT services  PT Problem List Decreased strength;Decreased activity tolerance;Decreased balance;Decreased mobility;Decreased cognition;Decreased knowledge of use of DME;Decreased safety awareness       PT Treatment Interventions DME instruction;Gait training;Functional mobility training;Therapeutic activities;Therapeutic exercise;Balance training;Patient/family education    PT Goals (Current goals can be found in the Care Plan section)  Acute Rehab PT Goals Patient Stated Goal: walk without RW PT Goal Formulation: With patient Time For Goal Achievement: 07/13/17 Potential to Achieve Goals: Fair    Frequency Min 2X/week   Barriers to discharge        Co-evaluation               AM-PAC PT "6 Clicks" Daily Activity  Outcome Measure Difficulty turning over in bed (including adjusting bedclothes, sheets and blankets)?: None Difficulty moving from lying on back to sitting on the side of the bed? : A Little Difficulty sitting down on and standing up from a chair with arms (e.g., wheelchair, bedside commode, etc,.)?: A Little Help needed moving to and from a bed to chair (including a wheelchair)?: A Little Help needed walking in hospital room?: A Little Help needed climbing 3-5 steps with a railing? : A Lot 6 Click Score: 18    End of Session Equipment Utilized During Treatment: Gait belt Activity Tolerance: Patient tolerated treatment well Patient left: in chair;with call bell/phone within reach;with chair alarm set;with nursing/sitter in room Nurse Communication: Mobility status PT Visit Diagnosis: Other abnormalities of gait and mobility (R26.89);Other symptoms and signs involving the nervous system (R29.898)     Time: 1643-1700 PT Time Calculation (min) (ACUTE ONLY): 17 min   Charges:   PT Evaluation $PT Eval Moderate Complexity: 1 Mod     PT G Codes:       Ellamae Sia, PT, DPT Acute Rehabilitation Services  Pager: [place] 06/29/2017, 5:37 PM

## 2017-06-29 NOTE — Progress Notes (Signed)
OT cancellation    06/29/17 0846  OT Visit Information  Last OT Received On 06/29/17  Reason Eval/Treat Not Completed Patient at procedure or test/ unavailable (pt in HD.)   Roseanne Reno, OTR/L

## 2017-06-29 NOTE — Progress Notes (Signed)
[name]  Subjective/Interval History:  TDC and left upper AVG by Dr. Scot Dock 5/24 HD#2 today for new ESRD  Wants a big Mac!  Objective Vital signs in last 24 hours: Vitals:   06/29/17 0830 06/29/17 0900 06/29/17 0930 06/29/17 1000  BP: 137/69 135/70 123/64 132/66  Pulse: 65 (!) 59 (!) 59 (!) 59  Resp: _0 Temp:      TempSrc:      SpO2:      Weight:      Height:       Weight change:   Intake/Output Summary (Last 24 hours) at 06/29/2017 1059 Last data filed at 06/29/2017 0615 Gross per 24 hour  Intake 1100 ml  Output 210 ml  Net 890 ml   Physical Exam:  Blood pressure 132/66, pulse (!) 59, temperature (!) 97.1 F (36.2 C), temperature source Oral, resp. rate 18, height _1  (1.651 m), weight 56.7 kg (125 lb), SpO2 92 %.  Small framed Hispanic man Seen inHD NAD VS as noted No JVD Lungs clear no crackles or wheezes S1S2 No S3 Soft systolic murmur LSB Abd soft and not tender No edema of LE's R IJ TDC and L upper AVG (5/24)  Recent Labs  Lab 06/25/17 1321 06/26/17 0325 06/27/17 0632 06/28/17 0651 06/29/17 0510  NA 131* 136 134* 135 133*  K 5.2* 4.9 4.5 5.0 4.9  CL 90* 94* 93* 94* 95*  CO2 _2 GLUCOSE 321* 148* 167* 141* 361*  BUN 92* 96* 90* 88* 67*  CREATININE 7.89* 8.09* 7.81* 7.98* 6.43*  CALCIUM 9.1 9.4 9.1 9.0 8.1*  PHOS  --  6.8* 6.8* 7.4* 6.2*    Recent Labs  Lab 06/27/17 0632 06/28/17 0651 06/29/17 0510  ALBUMIN 3.1* 3.2* 2.6*    Recent Labs  Lab 06/25/17 1321 06/26/17 0325 06/28/17 0651 06/29/17 0747  WBC 6.2 6.3 7.6 10.5  NEUTROABS 4.6  --   --   --   HGB 10.5* 11.1* 10.8* 9.1*  HCT 32.9* 34.9* 33.8* 29.4*  MCV 92.7 93.6 93.6 95.1  PLT 278 264 278 210    Recent Labs  Lab 06/27/17 2053 06/28/17 0809 06/28/17 1145 06/28/17 1651 06/28/17 2050  GLUCAP 333* 152* 169* 197* 294*     Recent Labs  Lab 06/25/17 1650  IRON 48  TIBC 228*  FERRITIN 155   Medications  . amLODipine  10 mg Oral  QHS  . aspirin  81 mg Oral Daily  . calcitRIOL  0.25 mcg Oral Daily  . carvedilol  25 mg Oral BID WC  . docusate sodium  100 mg Oral BID  . enoxaparin (LOVENOX) injection  30 mg Subcutaneous Q24H  . hydrALAZINE  100 mg Oral TID  . insulin aspart  0-5 Units Subcutaneous QHS  . insulin aspart  0-9 Units Subcutaneous TID WC  . insulin aspart  3 Units Subcutaneous TID WC  . isosorbide mononitrate  120 mg Oral Daily  . sertraline  50 mg Oral Daily  . simvastatin  20 mg Oral Daily  . sodium bicarbonate  1,300 mg Oral BID  . sodium chloride flush  3 mL Intravenous Q12H  . 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 Was here 4/23-06/09/17 for weakness, neurovegetative sx. Discharged to SNF d/t inability to care for self or live alone, residing at [facility].  Initial renal eval  done 05/2017 for creatinine of 5.8. All serologies were negative. 4.3 gm proteinuria. Had been taking NSAIDS so hope was for some possible recovery. Seen by Dr. Carolin Sicks at our office 06/21/17 for F/U of advanced CKD. Creatinine was up to 7.54, BUN 95, K 5.4.Sent to ED by [facility]. TDC/L upper AVG 5.24 Dr. Scot Dock. HD initiated 5.24.19 via [place].  Assessment/Recommendations  1. 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 Had twin with ESRD. Only minimal uremic sx but needs to move forward with access etc more expeditiously than can be done as outpt.  1. Vein mapping 06/04/17 (last admit)  2. TDC and AVF AVG done 5/24 Dr. Scot Dock 3. HD#1 5/24, #2  5/25 4. CLIP pending 2. Anemia  1. rec'd Feraheme last admission. 2. TSat 21. Giving 3 daily doses Ferrlecit.  3. Start Aranesp for Hb now <10 100 today 5/25 3. Secondary HPT  1. on Calcitriol.  2. Continue this daily pending outpt HD spot - then consolidate to give on HD days 4. Metabolic acidosis - has been on Na bicarb 2TID and CO2 normal. Stopped 5. H/O stroke with residual deficits- neg CT for acute last adm, just old  lacunes. ASA, plavix (resumed), statin 6. Depression - sertraline and trazadone started last adm. Continue 7. HTN - appears controlled on current BP regimen. 8. CAD prior PTCA - nitrates   Jeffery Maes, MD [facility] [phone] pager 06/29/2017, 10:59 AM

## 2017-06-29 NOTE — Progress Notes (Signed)
   Daily Progress Note   Assessment/Planning:   POD #1 s/p RIJV TDC, LUA AVG   Strong bruit and thrill with intact radial pulse  No evidence of steal  F/u in office in 4 weeks   Subjective  - 1 Day Post-Op   C/o pain at incisions   Objective   Vitals:   06/28/17 2005 06/28/17 2046 06/28/17 2047 06/29/17 0453  BP: (!) 146/66  (!) 147/70 125/71  Pulse: 84  81 72  Resp:    16  Temp:   98.1 F (36.7 C) (!) 97.5 F (36.4 C)  TempSrc:   Oral Oral  SpO2:   91% 93%  Weight:  126 lb 1.7 oz (57.2 kg)    Height:         Intake/Output Summary (Last 24 hours) at 06/29/2017 0830 Last data filed at 06/29/2017 4174 Gross per 24 hour  Intake 1100 ml  Output 210 ml  Net 890 ml    PULM: RIJV in placed without bleeding Radiology     Dg Chest Port 1 View  Result Date: 06/28/2017 CLINICAL DATA:  52 year old male with a history of dialysis catheter placement EXAM: PORTABLE CHEST 1 VIEW COMPARISON:  05/28/2017 FINDINGS: Cardiomediastinal silhouette unchanged in size and contour. Coronary stents in place. Interval placement of right IJ approach hemodialysis catheter with the tip appearing to terminate superior vena cava. No confluent airspace disease. No pneumothorax or pleural effusion. No acute displaced fracture. IMPRESSION: Interval placement of right IJ approach hemodialysis catheter, with no complicating features. Electronically Signed   By: Corrie Mckusick D.O.   On: 06/28/2017 17:12   LUE: hand grip 4/5 due to pain, strong thrill and bruit, inc x 2 c/d/i   Laboratory   CBC CBC Latest Ref Rng & Units 06/29/2017 06/28/2017 06/26/2017  WBC 4.0 - 10.5 K/uL 10.5 7.6 6.3  Hemoglobin 13.0 - 17.0 g/dL 9.1(L) 10.8(L) 11.1(L)  Hematocrit 39.0 - 52.0 % 29.4(L) 33.8(L) 34.9(L)  Platelets 150 - 400 K/uL 210 278 264    BMET    Component Value Date/Time   NA 133 (L) 06/29/2017 0510   K 4.9 06/29/2017 0510   CL 95 (L) 06/29/2017 0510   CO2 26 06/29/2017 0510   GLUCOSE 361 (H)  06/29/2017 0510   BUN 67 (H) 06/29/2017 0510   CREATININE 6.43 (H) 06/29/2017 0510   CREATININE 1.61 (H) 01/03/2016 1236   CALCIUM 8.1 (L) 06/29/2017 0510   CALCIUM 8.3 (L) 05/31/2017 0954   GFRNONAA 9 (L) 06/29/2017 0510   GFRAA 10 (L) 06/29/2017 0510     Adele Barthel, MD, FACS [facility] Office: [phone] Pager: [phone]  06/29/2017, 8:30 AM

## 2017-06-29 NOTE — Progress Notes (Signed)
[facility]                                                                              Patient Demographics  Jeffery Young, is a 52 y.o. male, DOB - February 24, 1965, GDJ:[id number]  Admit date - 06/25/2017   Admitting Physician Karmen Bongo, MD  Outpatient Primary MD for the patient is [name], MD  Outpatient specialists:   LOS - 4  days   Medical records reviewed and are as summarized below:    No chief complaint on file.      Brief summary    Jeffery Young is a 52 y.o. male with medical history significant of DM; HTN; HLD; and CAD, CKD, history of TIAs, presented with generalized weakness, terms of uremia, worsening creatinine. He does still make urine - voids about 3-4 times daily.  This time, he was having ongoing fatigue and he saw nephrology; they suggested he come in to start HD. He was previously hospitalized from 4/23-5/5 for acute renal failure on CKD with progression to Stage V CKD.  He has had vein mapping performed and was planning to have access placement as an outpatient.    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) ([place]) -Vein mapping done on 05/25/2017, nephrology and vascular surgery consulted, plan for [facility] placement, left arm AV fistula versus graft placement this week  -Underwent graft placement on 5/24, hemodialysis initiated, so far tolerating well -Will need CLIP for outpatient dialysis center  Active Problems:   Type 2 diabetes mellitus with diabetic nephropathy, without long-term current use of insulin ([place]), uncontrolled with hyperglycemia -Continue sliding scale insulin, NovoLog meal coverage, CBGs improving -Hemoglobin A1c 6.9 on 4/24     Essential hypertension -Continue Coreg and hydralazine.  BP now much stable.     Hypercholesteremia  - continue simvastatin    Chronic combined systolic and diastolic heart failure ([place]) -Echo 1/18 showed preserved EF, grade 1  diastolic dysfunction -Volume control with hemodialysis    Depression Continue Zoloft    Normocytic anemia likely anemia of chronic kidney disease H&H stable  History of CVA -Continue aspirin, Plavix, Imdur, statin, stable    Code Status: Full CODE STATUS DVT Prophylaxis: Lovenox Family Communication: Discussed in detail with the patient, all imaging results, lab results explained to the patient    Disposition Plan: Once cleared by nephrology  Time Spent in minutes 25-minutes  Procedures:  None  Consultants:   Nephrology Vascular surgery  Antimicrobials:      Medications  Scheduled Meds: . amLODipine  10 mg Oral QHS  . aspirin  81 mg Oral Daily  . calcitRIOL  0.25 mcg Oral Daily  . carvedilol  25 mg Oral BID WC  . clopidogrel  75 mg Oral Daily  . [START ON 07/06/2017] darbepoetin (ARANESP) injection - DIALYSIS  100 mcg Intravenous Q Sat-HD  . docusate sodium  100 mg Oral BID  . enoxaparin (LOVENOX) injection  30 mg Subcutaneous Q24H  . hydrALAZINE  100 mg Oral TID  . insulin aspart  0-5 Units Subcutaneous QHS  . insulin aspart  0-9 Units Subcutaneous TID WC  . insulin aspart  3 Units Subcutaneous TID WC  . isosorbide mononitrate  120 mg Oral Daily  . sertraline  50 mg Oral Daily  . simvastatin  20 mg Oral Daily  . sodium chloride flush  3 mL Intravenous Q12H  . traZODone  50 mg Oral QHS   Continuous Infusions:  PRN Meds:.acetaminophen **OR** acetaminophen, calcium carbonate (dosed in mg elemental calcium), camphor-menthol **AND** hydrOXYzine, docusate sodium, feeding supplement (NEPRO CARB STEADY), ondansetron **OR** ondansetron (ZOFRAN) IV, oxyCODONE-acetaminophen, sorbitol, zolpidem   Antibiotics   Anti-infectives (From admission, onward)   Start     Dose/Rate Route Frequency Ordered Stop   06/28/17 0600  ceFAZolin (ANCEF) IVPB 1 g/50 mL premix    Note to Pharmacy:  Send with pt to OR   1 g 100 mL/hr over 30 Minutes Intravenous To [facility]  06/26/17 0814 06/28/17 1356   06/28/17 0000  ceFAZolin (ANCEF) IVPB 1 g/50 mL premix  Status:  Discontinued    Note to Pharmacy:  Send with pt to OR   1 g 100 mL/hr over 30 Minutes Intravenous On call 06/27/17 0815 06/27/17 8811        Subjective:   Jeffery Young was seen and examined today.  No complaints per patient.  Seen during hemodialysis, tolerating well.  Patient denies dizziness, chest pain, shortness of breath, abdominal pain, N/V/D/C.  No acute events overnight Objective:   Vitals:   06/29/17 1000 06/29/17 1030 06/29/17 1115 06/29/17 1139  BP: 132/66 123/70 132/67 138/68  Pulse: (!) 59 (!) 58 (!) 59 63  Resp: 18 18 20 20   Temp:   (!) 97.1 F (36.2 C) 98.5 F (36.9 C)  TempSrc:   Oral Oral  SpO2:   94% 97%  Weight:   56 kg (123 lb 7.3 oz)   Height:        Intake/Output Summary (Last 24 hours) at 06/29/2017 1258 Last data filed at 06/29/2017 1230 Gross per 24 hour  Intake 1343 ml  Output 752 ml  Net 591 ml     Wt Readings from Last 3 Encounters:  06/29/17 56 kg (123 lb 7.3 oz)  06/09/17 55.7 kg (122 lb 12.8 oz)  11/20/16 58.9 kg (129 lb 12.8 oz)     Exam    General: Alert and oriented x 3, NAD  Eyes:   HEENT:   Cardiovascular: S1 S2 auscultated,  Regular rate and rhythm. No pedal edema b/l  Respiratory: Clear to auscultation bilaterally, no wheezing, rales or rhonchi  Gastrointestinal: Soft, nontender, nondistended, + bowel sounds  Ext: no pedal edema bilaterally  Neuro: no new complaints  Musculoskeletal: No digital cyanosis, clubbing  Skin: No rashes  Psych: alert and oriented x3, flat affect    Data Reviewed:  I have personally reviewed following labs and imaging studies  Micro Results Recent Results (from the past 240 hour(s))  Surgical pcr screen     Status: None   Collection Time: 06/28/17  7:00 AM  Result Value Ref Range Status   MRSA, PCR NEGATIVE NEGATIVE Final   Staphylococcus aureus NEGATIVE NEGATIVE Final    Comment:  (NOTE) The Xpert SA Assay (FDA approved for NASAL specimens in patients 101 years of age and older), is one component of a comprehensive surveillance program. It is not intended to diagnose infection nor to guide or monitor treatment. Performed at [facility], [place] [address], [place], [place] [address]     Radiology Reports Dg Chest [place]  1 View  Result Date: 06/28/2017 CLINICAL DATA:  52 year old male with a history of dialysis catheter placement EXAM: PORTABLE CHEST 1 VIEW COMPARISON:  05/28/2017 FINDINGS: Cardiomediastinal silhouette unchanged in size and contour. Coronary stents in place. Interval placement of right IJ approach hemodialysis catheter with the tip appearing to terminate superior vena cava. No confluent airspace disease. No pneumothorax or pleural effusion. No acute displaced fracture. IMPRESSION: Interval placement of right IJ approach hemodialysis catheter, with no complicating features. Electronically Signed   By: Corrie Mckusick D.O.   On: 06/28/2017 17:12    Lab Data:  CBC: Recent Labs  Lab 06/25/17 1321 06/26/17 0325 06/28/17 0651 06/29/17 0747  WBC 6.2 6.3 7.6 10.5  NEUTROABS 4.6  --   --   --   HGB 10.5* 11.1* 10.8* 9.1*  HCT 32.9* 34.9* 33.8* 29.4*  MCV 92.7 93.6 93.6 95.1  PLT 278 264 278 106   Basic Metabolic Panel: Recent Labs  Lab 06/25/17 1321 06/26/17 0325 06/27/17 0632 06/28/17 0651 06/29/17 0510  NA 131* 136 134* 135 133*  K 5.2* 4.9 4.5 5.0 4.9  CL 90* 94* 93* 94* 95*  CO2 28 26 27 27 26   GLUCOSE 321* 148* 167* 141* 361*  BUN 92* 96* 90* 88* 67*  CREATININE 7.89* 8.09* 7.81* 7.98* 6.43*  CALCIUM 9.1 9.4 9.1 9.0 8.1*  MG 2.7*  --   --   --   --   PHOS  --  6.8* 6.8* 7.4* 6.2*   GFR: Estimated Creatinine Clearance: 10.8 mL/min (A) (by C-G formula based on SCr of 6.43 mg/dL (H)). Liver Function Tests: Recent Labs  Lab 06/26/17 0325 06/27/17 2694 06/28/17 0651 06/29/17 0510  ALBUMIN 3.2* 3.1* 3.2* 2.6*   No results for  input(s): LIPASE, AMYLASE in the last 168 hours. No results for input(s): AMMONIA in the last 168 hours. Coagulation Profile: Recent Labs  Lab 06/28/17 0651  INR 1.14   Cardiac Enzymes: No results for input(s): CKTOTAL, CKMB, CKMBINDEX, TROPONINI in the last 168 hours. BNP (last 3 results) No results for input(s): PROBNP in the last 8760 hours. HbA1C: No results for input(s): HGBA1C in the last 72 hours. CBG: Recent Labs  Lab 06/28/17 0809 06/28/17 1145 06/28/17 1651 06/28/17 2050 06/29/17 1140  GLUCAP 152* 169* 197* 294* 121*   Lipid Profile: No results for input(s): CHOL, HDL, LDLCALC, TRIG, CHOLHDL, LDLDIRECT in the last 72 hours. Thyroid Function Tests: No results for input(s): TSH, T4TOTAL, FREET4, T3FREE, THYROIDAB in the last 72 hours. Anemia Panel: No results for input(s): VITAMINB12, FOLATE, FERRITIN, TIBC, IRON, RETICCTPCT in the last 72 hours. Urine analysis:    Component Value Date/Time   COLORURINE YELLOW 06/25/2017 1300   APPEARANCEUR CLEAR 06/25/2017 1300   LABSPEC 1.009 06/25/2017 1300   PHURINE 7.0 06/25/2017 1300   GLUCOSEU >=500 (A) 06/25/2017 1300   HGBUR NEGATIVE 06/25/2017 1300   BILIRUBINUR NEGATIVE 06/25/2017 1300   KETONESUR NEGATIVE 06/25/2017 1300   PROTEINUR 100 (A) 06/25/2017 1300   UROBILINOGEN 1.0 08/21/2012 2250   NITRITE NEGATIVE 06/25/2017 1300   LEUKOCYTESUR NEGATIVE 06/25/2017 1300     Ripudeep Rai M.D. [facility] 06/29/2017, 12:58 PM  Pager: [phone] Between 7am to 7pm - call Pager - [phone]  After 7pm go to [email or web address] - password [email or web address]  Call night coverage person covering after 7pm

## 2017-06-30 LAB — RENAL FUNCTION PANEL
ALBUMIN: 2.8 g/dL — AB (ref 3.5–5.0)
ANION GAP: 11 (ref 5–15)
BUN: 43 mg/dL — ABNORMAL HIGH (ref 6–20)
CHLORIDE: 98 mmol/L — AB (ref 101–111)
CO2: 28 mmol/L (ref 22–32)
Calcium: 8.5 mg/dL — ABNORMAL LOW (ref 8.9–10.3)
Creatinine, Ser: 5.51 mg/dL — ABNORMAL HIGH (ref 0.61–1.24)
GFR calc Af Amer: 13 mL/min — ABNORMAL LOW (ref >60)
GFR calc non Af Amer: 11 mL/min — ABNORMAL LOW (ref >60)
GLUCOSE: 173 mg/dL — AB (ref 65–99)
PHOSPHORUS: 5.9 mg/dL — AB (ref 2.5–4.6)
POTASSIUM: 4.3 mmol/L (ref 3.5–5.1)
Sodium: 137 mmol/L (ref 135–145)

## 2017-06-30 LAB — GLUCOSE, CAPILLARY
GLUCOSE-CAPILLARY: 175 mg/dL — AB (ref 65–99)
GLUCOSE-CAPILLARY: 127 mg/dL — AB (ref 65–99)
GLUCOSE-CAPILLARY: 239 mg/dL — AB (ref 65–99)
Glucose-Capillary: 129 mg/dL — ABNORMAL HIGH (ref 65–99)

## 2017-06-30 MED ORDER — DARBEPOETIN ALFA 100 MCG/0.5ML IJ SOSY
100.0000 ug | PREFILLED_SYRINGE | Freq: Once | INTRAMUSCULAR | Status: AC
  Administered 2017-06-30: 100 ug via SUBCUTANEOUS
  Filled 2017-06-30: qty 0.5

## 2017-06-30 MED ORDER — SEVELAMER CARBONATE 800 MG PO TABS
800.0000 mg | ORAL_TABLET | Freq: Three times a day (TID) | ORAL | Status: DC
  Administered 2017-06-30 – 2017-07-04 (×11): 800 mg via ORAL
  Filled 2017-06-30 (×12): qty 1

## 2017-06-30 MED ORDER — SODIUM CHLORIDE 0.9 % IV SOLN
125.0000 mg | Freq: Once | INTRAVENOUS | Status: AC
  Administered 2017-06-30: 125 mg via INTRAVENOUS
  Filled 2017-06-30: qty 10

## 2017-06-30 NOTE — Clinical Social Work Note (Signed)
Pt is from [facility]. CSW spoke with facility and they will take the pt back at d/c. CSW continuing to follow for d/c.   Jeffery Young, Jeffery Young

## 2017-06-30 NOTE — NC FL2 (Signed)
[place] LEVEL OF CARE SCREENING TOOL     IDENTIFICATION  Patient Name: Jeffery Young Birthdate: [birth year] Sex: male Admission Date (Current Location): 06/25/2017  [facility] [place] Number:  Herbalist and Address:  [facility], [place] [address] [place], [place] [address]      Provider Number: [id number]  Attending Physician Name and Address:  Mendel Corning, MD  Relative Name and Phone Number:  Jahlon, Baines [phone]    Current Level of Care: Hospital Recommended Level of Care: [place] Prior Approval Number:    Date Approved/Denied:   PASRR Number:    Discharge Plan: SNF    Current Diagnoses: Patient Active Problem List   Diagnosis Date Noted  . ESRD (end stage renal disease) on dialysis ([place]) 06/25/2017  . Uncontrolled hypertension 05/28/2017  . Depression 05/28/2017  . Normocytic anemia 05/28/2017  . History of completed stroke 05/28/2017  . Syncope 03/13/2016  . Ischemic cardiomyopathy 03/13/2016  . Unstable angina pectoris ([place]) 01/11/2016  . Hypertensive heart disease with heart failure ([place])   . Chronic combined systolic and diastolic heart failure ([place])   . Elevated troponin   . Chest pain 01/10/2016  . Coronary artery disease due to lipid rich plaque 01/10/2016  . Status post coronary artery stent placement   . Type 2 diabetes mellitus with diabetic nephropathy, without long-term current use of insulin ([place])   . Tobacco abuse   . Essential hypertension   . Hypercholesteremia   . NSTEMI (non-ST elevated myocardial infarction) ([place]) 12/22/2015  . Lightheadedness 08/22/2012  . Dehydration 08/22/2012    Orientation RESPIRATION BLADDER Height & Weight     Self, Time, Situation, Place  Normal Continent Weight: 128 lb 1.4 oz (58.1 kg) Height:  5\' 5"  (165.1 cm)  BEHAVIORAL SYMPTOMS/MOOD NEUROLOGICAL BOWEL NUTRITION STATUS      Continent Diet(Diet renal with fluid restriction Fluid  restriction: 1200 mL )  AMBULATORY STATUS COMMUNICATION OF NEEDS Skin   Limited Assist Verbally Other (Comment)(Closed wound right chest)                       Personal Care Assistance Level of Assistance  Bathing, Feeding, Dressing Bathing Assistance: Limited assistance Feeding assistance: Independent Dressing Assistance: Limited assistance     Functional Limitations Info  Sight, Hearing, Speech Sight Info: Adequate Hearing Info: Adequate Speech Info: Adequate    SPECIAL CARE FACTORS FREQUENCY  PT (By licensed PT), OT (By licensed OT)     PT Frequency: 2x OT Frequency: 2x            Contractures Contractures Info: Not present    Additional Factors Info  Code Status, Allergies Code Status Info: Full Code Allergies Info: No Known Allergies           Current Medications (06/30/2017):  This is the current hospital active medication list Current Facility-Administered Medications  Medication Dose Route Frequency Provider Last Rate Last Dose  . acetaminophen (TYLENOL) tablet 650 mg  650 mg Oral Q6H PRN Rhyne, Samantha J, PA-C       Or  . acetaminophen (TYLENOL) suppository 650 mg  650 mg Rectal Q6H PRN Rhyne, Samantha J, PA-C      . amLODipine (NORVASC) tablet 10 mg  10 mg Oral QHS Rhyne, Samantha J, PA-C   10 mg at 06/29/17 2159  . aspirin chewable tablet 81 mg  81 mg Oral Daily Rhyne, Samantha J, PA-C   81 mg at  06/30/17 0903  . calcitRIOL (ROCALTROL) capsule 0.25 mcg  0.25 mcg Oral Daily Rhyne, Samantha J, PA-C   0.25 mcg at 06/30/17 0902  . calcium carbonate (dosed in mg elemental calcium) suspension 500 mg of elemental calcium  500 mg of elemental calcium Oral Q6H PRN Rhyne, Samantha J, PA-C   500 mg of elemental calcium at 06/25/17 1744  . camphor-menthol (SARNA) lotion 1 application  1 application Topical L2G PRN Rhyne, Samantha J, PA-C       And  . hydrOXYzine (ATARAX/VISTARIL) tablet 25 mg  25 mg Oral Q8H PRN Rhyne, Samantha J, PA-C      . carvedilol  (COREG) tablet 25 mg  25 mg Oral BID WC Rhyne, Samantha J, PA-C   25 mg at 06/30/17 0900  . clopidogrel (PLAVIX) tablet 75 mg  75 mg Oral Daily Jamal Maes, MD   75 mg at 06/30/17 0900  . [START ON 07/06/2017] Darbepoetin Alfa (ARANESP) injection 100 mcg  100 mcg Intravenous Q Sat-HD Jamal Maes, MD      . docusate sodium (COLACE) capsule 100 mg  100 mg Oral BID Rhyne, Samantha J, PA-C   100 mg at 06/30/17 0900  . docusate sodium (ENEMEEZ) enema 283 mg  1 enema Rectal PRN Rhyne, Samantha J, PA-C      . enoxaparin (LOVENOX) injection 30 mg  30 mg Subcutaneous Q24H Rhyne, Samantha J, PA-C   30 mg at 06/29/17 1755  . feeding supplement (NEPRO CARB STEADY) liquid 237 mL  237 mL Oral TID PRN Rhyne, Samantha J, PA-C      . hydrALAZINE (APRESOLINE) tablet 100 mg  100 mg Oral TID Rhyne, Samantha J, PA-C   100 mg at 06/30/17 0904  . insulin aspart (novoLOG) injection 0-5 Units  0-5 Units Subcutaneous QHS Gabriel Earing, PA-C   3 Units at 06/28/17 2234  . insulin aspart (novoLOG) injection 0-9 Units  0-9 Units Subcutaneous TID WC Rhyne, Samantha J, PA-C   2 Units at 06/30/17 0859  . insulin aspart (novoLOG) injection 3 Units  3 Units Subcutaneous TID WC Rhyne, Samantha J, PA-C   3 Units at 06/30/17 0859  . isosorbide mononitrate (IMDUR) 24 hr tablet 120 mg  120 mg Oral Daily Rhyne, Samantha J, PA-C   120 mg at 06/30/17 0900  . ondansetron (ZOFRAN) tablet 4 mg  4 mg Oral Q6H PRN Rhyne, Samantha J, PA-C   4 mg at 06/27/17 2225   Or  . ondansetron (ZOFRAN) injection 4 mg  4 mg Intravenous Q6H PRN Rhyne, Samantha J, PA-C      . oxyCODONE-acetaminophen (PERCOCET/ROXICET) 5-325 MG per tablet 1 tablet  1 tablet Oral Q6H PRN Rhyne, Hulen Shouts, PA-C   1 tablet at 06/28/17 1930  . sertraline (ZOLOFT) tablet 50 mg  50 mg Oral Daily Rhyne, Samantha J, PA-C   50 mg at 06/30/17 0901  . simvastatin (ZOCOR) tablet 20 mg  20 mg Oral Daily Rhyne, Samantha J, PA-C   20 mg at 06/30/17 0904  . sodium chloride flush (NS)  0.9 % injection 3 mL  3 mL Intravenous Q12H Rhyne, Samantha J, PA-C   3 mL at 06/30/17 0904  . sorbitol 70 % solution 30 mL  30 mL Oral PRN Rhyne, Samantha J, PA-C      . traZODone (DESYREL) tablet 50 mg  50 mg Oral QHS Rhyne, Samantha J, PA-C   50 mg at 06/29/17 2159  . zolpidem (AMBIEN) tablet 5 mg  5 mg Oral QHS PRN  Gabriel Earing, PA-C   5 mg at 06/25/17 2251     Discharge Medications: Please see discharge summary for a list of discharge medications.  Relevant Imaging Results:  Relevant Lab Results:   Additional Information SSN: [id number]  [place] Chard, LCSW

## 2017-06-30 NOTE — Progress Notes (Signed)
[facility]                                                                              Patient Demographics  Jeffery Young, is a 52 y.o. male, DOB - 01/06/1966, QIO:[id number]  Admit date - 06/25/2017   Admitting Physician Jonah Blue, MD  Outpatient Primary MD for the patient is Knox Royalty, MD  Outpatient specialists:   LOS - 5  days   Medical records reviewed and are as summarized below:    No chief complaint on file.      Brief summary    Jeffery Young is a 52 y.o. male with medical history significant of DM; HTN; HLD; and CAD, CKD, history of TIAs, presented with generalized weakness, terms of uremia, worsening creatinine. He does still make urine - voids about 3-4 times daily.  This time, he was having ongoing fatigue and he saw nephrology; they suggested he come in to start HD. He was previously hospitalized from 4/23-5/5 for acute renal failure on CKD with progression to Stage V CKD.  He has had vein mapping performed and was planning to have access placement as an outpatient.    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  HD on 5/24 and 5/25 done, tolerated. -Will need CLIP for outpatient dialysis center  Active Problems:   Type 2 diabetes mellitus with diabetic nephropathy, without long-term current use of insulin (HCC), uncontrolled with hyperglycemia -CBG is improving.  Continue sliding scale insulin, NovoLog meal coverage -Hemoglobin A1c 6.9 on 4/24     Essential hypertension -Continue Coreg and hydralazine.  BP now much stable.      Hypercholesteremia  - continue simvastatin    Chronic combined systolic and diastolic heart failure (HCC) -Echo 1/18 showed  preserved EF, grade 1 diastolic dysfunction -Volume control with hemodialysis    Depression Continue Zoloft    Normocytic anemia likely anemia of chronic kidney disease H&H stable  History of CVA -Continue aspirin, Plavix, Imdur, statin, stable    Code Status: Full CODE STATUS DVT Prophylaxis: Lovenox Family Communication: Discussed in detail with the patient, all imaging results, lab results explained to the patient    Disposition Plan: Skilled nursing facility once available and clip process  Time Spent in minutes 25-minutes  Procedures:  None  Consultants:   Nephrology Vascular surgery  Antimicrobials:      Medications  Scheduled Meds: . amLODipine  10 mg Oral QHS  . aspirin  81 mg Oral Daily  . calcitRIOL  0.25 mcg Oral Daily  . carvedilol  25 mg Oral BID WC  . clopidogrel  75 mg Oral Daily  . darbepoetin (ARANESP) injection - NON-DIALYSIS  100 mcg Subcutaneous Once  . docusate sodium  100 mg Oral BID  . enoxaparin (LOVENOX) injection  30 mg Subcutaneous Q24H  . hydrALAZINE  100 mg Oral TID  . insulin aspart  0-5 Units  Subcutaneous QHS  . insulin aspart  0-9 Units Subcutaneous TID WC  . insulin aspart  3 Units Subcutaneous TID WC  . isosorbide mononitrate  120 mg Oral Daily  . sertraline  50 mg Oral Daily  . sevelamer carbonate  800 mg Oral TID WC  . simvastatin  20 mg Oral Daily  . sodium chloride flush  3 mL Intravenous Q12H  . traZODone  50 mg Oral QHS   Continuous Infusions: . ferric gluconate (FERRLECIT/NULECIT) IV     PRN Meds:.acetaminophen **OR** acetaminophen, calcium carbonate (dosed in mg elemental calcium), camphor-menthol **AND** hydrOXYzine, docusate sodium, feeding supplement (NEPRO CARB STEADY), ondansetron **OR** ondansetron (ZOFRAN) IV, oxyCODONE-acetaminophen, sorbitol, zolpidem   Antibiotics   Anti-infectives (From admission, onward)   Start     Dose/Rate Route Frequency Ordered Stop   06/28/17 0600  ceFAZolin (ANCEF) IVPB 1  g/50 mL premix    Note to Pharmacy:  Send with pt to OR   1 g 100 mL/hr over 30 Minutes Intravenous To ShortStay Surgical 06/26/17 0814 06/28/17 1356   06/28/17 0000  ceFAZolin (ANCEF) IVPB 1 g/50 mL premix  Status:  Discontinued    Note to Pharmacy:  Send with pt to OR   1 g 100 mL/hr over 30 Minutes Intravenous On call 06/27/17 0815 06/27/17 9604        Subjective:   Jeffery Young was seen and examined today.  Flat affect, no significant complaints.  Feels tired.  Patient denies dizziness, chest pain, shortness of breath, abdominal pain, N/V/D/C.  No acute events overnight Objective:   Vitals:   06/29/17 1700 06/29/17 2058 06/30/17 0454 06/30/17 0924  BP: 126/63 124/64  (!) 157/76  Pulse: 65 70 85 76  Resp: 18 14 12 20   Temp: 99.1 F (37.3 C) 98.3 F (36.8 C) 98.4 F (36.9 C) 97.9 F (36.6 C)  TempSrc: Oral Oral Oral Oral  SpO2: 95% 95% 93% 96%  Weight:  58.1 kg (128 lb 1.4 oz)    Height:        Intake/Output Summary (Last 24 hours) at 06/30/2017 1358 Last data filed at 06/30/2017 0914 Gross per 24 hour  Intake 560 ml  Output 275 ml  Net 285 ml     Wt Readings from Last 3 Encounters:  06/29/17 58.1 kg (128 lb 1.4 oz)  06/09/17 55.7 kg (122 lb 12.8 oz)  11/20/16 58.9 kg (129 lb 12.8 oz)     Exam   General: Alert and oriented x 3, NAD  Eyes:   HEENT:   Cardiovascular: S1 S2 auscultated,Regular rate and rhythm. No pedal edema b/l  Respiratory: Clear to auscultation bilaterally, no wheezing, rales or rhonchi  Gastrointestinal: Soft, nontender, nondistended, + bowel sounds  Ext: no pedal edema bilaterally  Neuro: no new deficits  Musculoskeletal: No digital cyanosis, clubbing  Skin: No rashes  Psych: flat affect    Data Reviewed:  I have personally reviewed following labs and imaging studies  Micro Results Recent Results (from the past 240 hour(s))  Surgical pcr screen     Status: None   Collection Time: 06/28/17  7:00 AM  Result Value Ref  Range Status   MRSA, PCR NEGATIVE NEGATIVE Final   Staphylococcus aureus NEGATIVE NEGATIVE Final    Comment: (NOTE) The Xpert SA Assay (FDA approved for NASAL specimens in patients 49 years of age and older), is one component of a comprehensive surveillance program. It is not intended to diagnose infection nor to guide or monitor treatment. Performed at [facility]  [facility], [address] [place], [place] [address]     Radiology Reports Dg Chest [place] 1 View  Result Date: 06/28/2017 CLINICAL DATA:  53 year old male with a history of dialysis catheter placement EXAM: PORTABLE CHEST 1 VIEW COMPARISON:  05/28/2017 FINDINGS: Cardiomediastinal silhouette unchanged in size and contour. Coronary stents in place. Interval placement of right IJ approach hemodialysis catheter with the tip appearing to terminate superior vena cava. No confluent airspace disease. No pneumothorax or pleural effusion. No acute displaced fracture. IMPRESSION: Interval placement of right IJ approach hemodialysis catheter, with no complicating features. Electronically Signed   By: Gilmer Mor D.O.   On: 06/28/2017 17:12    Lab Data:  CBC: Recent Labs  Lab 06/25/17 1321 06/26/17 0325 06/28/17 0651 06/29/17 0747  WBC 6.2 6.3 7.6 10.5  NEUTROABS 4.6  --   --   --   HGB 10.5* 11.1* 10.8* 9.1*  HCT 32.9* 34.9* 33.8* 29.4*  MCV 92.7 93.6 93.6 95.1  PLT 278 264 278 210   Basic Metabolic Panel: Recent Labs  Lab 06/25/17 1321 06/26/17 0325 06/27/17 0632 06/28/17 0651 06/29/17 0510 06/30/17 0553  NA 131* 136 134* 135 133* 137  K 5.2* 4.9 4.5 5.0 4.9 4.3  CL 90* 94* 93* 94* 95* 98*  CO2 28 26 27 27 26 28   GLUCOSE 321* 148* 167* 141* 361* 173*  BUN 92* 96* 90* 88* 67* 43*  CREATININE 7.89* 8.09* 7.81* 7.98* 6.43* 5.51*  CALCIUM 9.1 9.4 9.1 9.0 8.1* 8.5*  MG 2.7*  --   --   --   --   --   PHOS  --  6.8* 6.8* 7.4* 6.2* 5.9*   GFR: Estimated Creatinine Clearance: 13 mL/min (A) (by C-G formula based on SCr of  5.51 mg/dL (H)). Liver Function Tests: Recent Labs  Lab 06/26/17 0325 06/27/17 9811 06/28/17 0651 06/29/17 0510 06/30/17 0553  ALBUMIN 3.2* 3.1* 3.2* 2.6* 2.8*   No results for input(s): LIPASE, AMYLASE in the last 168 hours. No results for input(s): AMMONIA in the last 168 hours. Coagulation Profile: Recent Labs  Lab 06/28/17 0651  INR 1.14   Cardiac Enzymes: No results for input(s): CKTOTAL, CKMB, CKMBINDEX, TROPONINI in the last 168 hours. BNP (last 3 results) No results for input(s): PROBNP in the last 8760 hours. HbA1C: No results for input(s): HGBA1C in the last 72 hours. CBG: Recent Labs  Lab 06/29/17 1140 06/29/17 1702 06/29/17 2057 06/30/17 0743 06/30/17 1152  GLUCAP 121* 225* 190* 175* 127*   Lipid Profile: No results for input(s): CHOL, HDL, LDLCALC, TRIG, CHOLHDL, LDLDIRECT in the last 72 hours. Thyroid Function Tests: No results for input(s): TSH, T4TOTAL, FREET4, T3FREE, THYROIDAB in the last 72 hours. Anemia Panel: No results for input(s): VITAMINB12, FOLATE, FERRITIN, TIBC, IRON, RETICCTPCT in the last 72 hours. Urine analysis:    Component Value Date/Time   COLORURINE YELLOW 06/25/2017 1300   APPEARANCEUR CLEAR 06/25/2017 1300   LABSPEC 1.009 06/25/2017 1300   PHURINE 7.0 06/25/2017 1300   GLUCOSEU >=500 (A) 06/25/2017 1300   HGBUR NEGATIVE 06/25/2017 1300   BILIRUBINUR NEGATIVE 06/25/2017 1300   KETONESUR NEGATIVE 06/25/2017 1300   PROTEINUR 100 (A) 06/25/2017 1300   UROBILINOGEN 1.0 08/21/2012 2250   NITRITE NEGATIVE 06/25/2017 1300   LEUKOCYTESUR NEGATIVE 06/25/2017 1300     Sirus Labrie M.D. [facility] 06/30/2017, 1:58 PM  Pager: [phone] Between 7am to 7pm - call Pager - [phone]  After 7pm go to [email or web address] - password [facility]  Call  night coverage person covering after 7pm

## 2017-06-30 NOTE — Evaluation (Addendum)
Occupational Therapy Evaluation Patient Details Name: Jeffery Young MRN: [id number] DOB: 01-06-1966 Today's Date: 06/30/2017    History of Present Illness Pt. is a 52 y.o. M with significant PMH of DM, HTN, HLD, and CAD, recently hospitalized for a stroke. Now presenting with progressive renal failure and started on HD; recent L arm AVG placement.    Clinical Impression   PTA, pt was at SNF and had assistance for ADL participation. Pt presents to OT with decreased motivation to participate in ADL. He was able to stand at [facility] for use of urinal but declined ambulation to bathroom or pivot to chair. Pt presenting with decreased strength in R UE and decreased functional use of L UE due to recent AVG placement. He is able to complete dressing and bathing tasks with overall min guard to min assist. Pt would benefit from continued OT services post-acute D/C to maximize independence and safety with ADL and functional mobility. Recommend continued OT follow-up at SNF level rehabilitation post-acute D/C.    Follow Up Recommendations  SNF;Supervision/Assistance - 24 hour    Equipment Recommendations  3 in 1 bedside commode;None recommended by OT    Recommendations for Other Services       Precautions / Restrictions Precautions Precautions: Fall Precaution Comments: LUE restrictions (recent AVG placement)      Mobility Bed Mobility Overal bed mobility: Needs Assistance Bed Mobility: Supine to Sit     Supine to sit: Supervision     General bed mobility comments: Supervision for safety.   Transfers Overall transfer level: Needs assistance Equipment used: 1 person hand held assist Transfers: Sit to/from Stand Sit to Stand: Min assist         General transfer comment: Min assist to power up and sustain standing balance.     Balance Overall balance assessment: Needs assistance Sitting-balance support: No upper extremity supported;Feet supported Sitting balance-Leahy Scale: Good     Standing balance support: Bilateral upper extremity supported Standing balance-Leahy Scale: Poor Standing balance comment: Relies on assistance or UE support for balance.                            ADL either performed or assessed with clinical judgement   ADL Overall ADL's : Needs assistance/impaired Eating/Feeding: Set up;Sitting   Grooming: Min guard;Standing   Upper Body Bathing: Min guard;Sitting   Lower Body Bathing: Sit to/from stand;Minimal assistance   Upper Body Dressing : Min guard;Sitting   Lower Body Dressing: Sit to/from stand;Minimal assistance   Toilet Transfer: Minimal assistance Toilet Transfer Details (indicate cue type and reason): Pt standing to urinate in urinal with min assist to power up. Declined attempts to ambulate to bathroom or pivot to chair.  Toileting- Clothing Manipulation and Hygiene: Minimal assistance;Sit to/from stand Toileting - Clothing Manipulation Details (indicate cue type and reason): assist for balance       General ADL Comments: Pt initially declining to participate but then requesting OT to assist him to bathroom. However, then pt declining ambulation or pivot and requesting just to stand at EOB to use urinal despite max encouragement. He demonstrates instability on his feet and appears fearful of falling. Urinated 250 mL and notified RN.      Vision Baseline Vision/History: Wears glasses Wears Glasses: Reading only Patient Visual Report: No change from baseline Vision Assessment?: Vision impaired- to be further tested in functional context Additional Comments: Noted discongugate gaze. Pt able to use vision functionally today but will need  further assessment.      Perception     Praxis      Pertinent Vitals/Pain Pain Assessment: No/denies pain     Hand Dominance     Extremity/Trunk Assessment Upper Extremity Assessment Upper Extremity Assessment: RUE deficits/detail;LUE deficits/detail RUE Deficits / Details:  Grossly diminished strenth and AROM.  LUE Deficits / Details: recent AVG placement. At least 3/5 throughout.    Lower Extremity Assessment Lower Extremity Assessment: Defer to PT evaluation       Communication Communication Communication: No difficulties   Cognition Arousal/Alertness: Awake/alert Behavior During Therapy: Flat affect Overall Cognitive Status: Impaired/Different from baseline Area of Impairment: Attention;Following commands;Safety/judgement;Awareness;Problem solving;Memory                   Current Attention Level: Selective Memory: Decreased short-term memory Following Commands: Follows one step commands consistently Safety/Judgement: Decreased awareness of safety;Decreased awareness of deficits Awareness: Emergent Problem Solving: Decreased initiation;Requires verbal cues;Requires tactile cues General Comments: Pt reporting that he feels depressed due to his daughter and wife being in [place]. Difficulty problem solving and requiring max encouragement to participate.    General Comments  Pt reporting feeling depressed.    Exercises     Shoulder Instructions      Home Living Family/patient expects to be discharged to:: Skilled nursing facility Living Arrangements: Spouse/significant other                               Additional Comments: Per chart at previous admission, pt unable to d/c home with wife.       Prior Functioning/Environment Level of Independence: Needs assistance  Gait / Transfers Assistance Needed: Reports having RW but not being permitted to use due to L foot drop. ADL's / Homemaking Assistance Needed: Assist from SNF staff   Comments: From SNF. Reports they have not been letting him use RW to ambulate recently due to multiple falls.        OT Problem List: Decreased activity tolerance;Impaired balance (sitting and/or standing);Decreased safety awareness;Decreased knowledge of use of DME or AE;Decreased knowledge  of precautions;Pain;Impaired UE functional use;Impaired vision/perception;Decreased coordination;Decreased cognition;Decreased strength;Decreased range of motion      OT Treatment/Interventions: Self-care/ADL training;Therapeutic exercise;Energy conservation;DME and/or AE instruction;Therapeutic activities;Cognitive remediation/compensation;Visual/perceptual remediation/compensation;Patient/family education;Balance training    OT Goals(Current goals can be found in the care plan section) Acute Rehab OT Goals Patient Stated Goal: walk better OT Goal Formulation: With patient Time For Goal Achievement: 07/14/17 Potential to Achieve Goals: Good ADL Goals Pt Will Perform Grooming: with supervision;standing Pt Will Perform Upper Body Dressing: with modified independence;sitting Pt Will Perform Lower Body Dressing: sit to/from stand;with supervision Pt Will Transfer to Toilet: ambulating;bedside commode;with min guard assist(BSC over toilet; with RW) Pt Will Perform Toileting - Clothing Manipulation and hygiene: with supervision;sit to/from stand Additional ADL Goal #1: Pt will demonstrate anticipatory awareness during ADL participation in a minimally distracting environment.  OT Frequency: Min 2X/week   Barriers to D/C:            Co-evaluation              AM-PAC PT "6 Clicks" Daily Activity     Outcome Measure Help from another person eating meals?: None Help from another person taking care of personal grooming?: A Little Help from another person toileting, which includes using toliet, bedpan, or urinal?: A Little Help from another person bathing (including washing, rinsing, drying)?: A Little Help from another person  to put on and taking off regular upper body clothing?: A Little Help from another person to put on and taking off regular lower body clothing?: A Little 6 Click Score: 19   End of Session Nurse Communication: Mobility status(pt urinated 250 mL, pt seated at EOB for  lunch with alarm)  Activity Tolerance: Patient tolerated treatment well Patient left: with call bell/phone within reach;in bed;with bed alarm set(seated at EOB)  OT Visit Diagnosis: Other abnormalities of gait and mobility (R26.89);Muscle weakness (generalized) (M62.81);Other symptoms and signs involving cognitive function                Time: 1355-1409 OT Time Calculation (min): 14 min Charges:  OT General Charges $OT Visit: 1 Visit OT Evaluation $OT Eval Moderate Complexity: 1 Mod G-Codes:     Doristine Section, MS OTR/L  Pager: [phone]   Carlette Palmatier A Delmy Holdren 06/30/2017, 4:12 PM

## 2017-06-30 NOTE — Progress Notes (Signed)
[name]  Subjective/Interval History:  TDC and left upper AVG by Dr. Scot Dock 5/24 HD#2 5/25 Just wants to take a nap, says tired No CP, SOB or nausea  Objective Vital signs in last 24 hours: Vitals:   06/29/17 1700 06/29/17 2058 06/30/17 0454 06/30/17 0924  BP: 126/63 124/64  (!) 157/76  Pulse: 65 70 85 76  Resp: 18 14 12 20   Temp: 99.1 F (37.3 C) 98.3 F (36.8 C) 98.4 F (36.9 C) 97.9 F (36.6 C)  TempSrc: Oral Oral Oral Oral  SpO2: 95% 95% 93% 96%  Weight:  58.1 kg (128 lb 1.4 oz)    Height:       Weight change: 1.361 kg (3 lb)  Intake/Output Summary (Last 24 hours) at 06/30/2017 1145 Last data filed at 06/30/2017 0914 Gross per 24 hour  Intake 1043 ml  Output 275 ml  Net 768 ml   Physical Exam:  Blood pressure (!) 157/76, pulse 76, temperature 97.9 F (36.6 C), temperature source Oral, resp. rate 20, height 5\' 5"  (1.651 m), weight 58.1 kg (128 lb 1.4 oz), SpO2 96 %.  Small framed Hispanic man NAD VS as noted No JVD Lungs clear no crackles or wheezes S1S2 No S3 Soft systolic murmur LSB Abd soft and not tender No edema of LE's R IJ TDC and L upper AVG (5/24)with great bruit, clean incisions  Recent Labs  Lab 06/25/17 1321 06/26/17 0325 06/27/17 0632 06/28/17 0651 06/29/17 0510 06/30/17 0553  NA 131* 136 134* 135 133* 137  K 5.2* 4.9 4.5 5.0 4.9 4.3  CL 90* 94* 93* 94* 95* 98*  CO2 28 26 27 27 26 28   GLUCOSE 321* 148* 167* 141* 361* 173*  BUN 92* 96* 90* 88* 67* 43*  CREATININE 7.89* 8.09* 7.81* 7.98* 6.43* 5.51*  CALCIUM 9.1 9.4 9.1 9.0 8.1* 8.5*  PHOS  --  6.8* 6.8* 7.4* 6.2* 5.9*    Recent Labs  Lab 06/28/17 0651 06/29/17 0510 06/30/17 0553  ALBUMIN 3.2* 2.6* 2.8*    Recent Labs  Lab 06/25/17 1321 06/26/17 0325 06/28/17 0651 06/29/17 0747  WBC 6.2 6.3 7.6 10.5  NEUTROABS 4.6  --   --   --   HGB 10.5* 11.1* 10.8* 9.1*  HCT 32.9* 34.9* 33.8* 29.4*  MCV 92.7 93.6 93.6 95.1  PLT 278 264 278 210    Recent Labs  Lab  06/28/17 2050 06/29/17 1140 06/29/17 1702 06/29/17 2057 06/30/17 0743  GLUCAP 294* 121* 225* 190* 175*     Recent Labs  Lab 06/25/17 1650  IRON 48  TIBC 228*  FERRITIN 155   Medications  . amLODipine  10 mg Oral QHS  . aspirin  81 mg Oral Daily  . calcitRIOL  0.25 mcg Oral Daily  . carvedilol  25 mg Oral BID WC  . clopidogrel  75 mg Oral Daily  . [START ON 07/06/2017] darbepoetin (ARANESP) injection - DIALYSIS  100 mcg Intravenous Q Sat-HD  . docusate sodium  100 mg Oral BID  . enoxaparin (LOVENOX) injection  30 mg Subcutaneous Q24H  . hydrALAZINE  100 mg Oral TID  . insulin aspart  0-5 Units Subcutaneous QHS  . insulin aspart  0-9 Units Subcutaneous TID WC  . insulin aspart  3 Units Subcutaneous TID WC  . isosorbide mononitrate  120 mg Oral Daily  . sertraline  50 mg Oral Daily  . simvastatin  20 mg Oral Daily  . sodium chloride flush  3 mL Intravenous Q12H  .  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 Was here 4/23-06/09/17 for weakness, neurovegetative sx. Discharged to SNF d/t inability to care for self or live alone, residing at [facility]  Initial renal eval done 05/2017 for creatinine of 5.8. All serologies were negative. 4.3 gm proteinuria. Had been taking NSAIDS so hope was for some possible recovery. Seen by Dr. Carolin Sicks at our office 06/21/17 for F/U of advanced CKD. Creatinine was up to 7.54, BUN 95, K 5.4.Sent to ED by [facility]. TDC/L upper AVG 5.24 Dr. Scot Dock. HD initiated 5.24.19 via [place].  Assessment/Recommendations  1. 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 Had twin with ESRD. Only minimal uremic sx but needs to move forward with access etc more expeditiously than can be done as outpt.  1. TDC and Left upper AVG done 5/24 Dr. Scot Dock 2. HD#1 5/24, #2  5/25 3. CLIP pending 2. Anemia  1. rec'd Feraheme last admission. 2. TSat 21. Giving 3 daily doses Ferrlecit. I only see that 2 were given. Reordered  a 3rd 3. Aranesp for Hb  <10 100 mcg ordered for 5/25 and not given in HD as requested. Give dose today X1 then with HD thereafter 3. Secondary HPT  1. Calcitriol. Continue this daily pending outpt HD spot - then consolidate to give on HD days 2. Add binder for elev phos Renvela 1 ac 4. H/O stroke with residual deficits- neg CT for acute last adm, just old lacunes. ASA, plavix (resumed), statin 5. Depression - sertraline and trazadone started last adm. Continue 6. HTN - appears controlled on current BP regimen. 7. CAD prior PTCA - nitrates 8. Disposition - once CLIP done (not sure when that will be complete) could return to SNF. Need to know what SNF going to for outpt HD placement.   Jamal Maes, MD [facility] [phone] pager 06/30/2017, 11:45 AM

## 2017-07-01 ENCOUNTER — Encounter (HOSPITAL_COMMUNITY): Payer: Self-pay | Admitting: Vascular Surgery

## 2017-07-01 LAB — RENAL FUNCTION PANEL
ALBUMIN: 2.8 g/dL — AB (ref 3.5–5.0)
ANION GAP: 11 (ref 5–15)
BUN: 48 mg/dL — AB (ref 6–20)
CO2: 28 mmol/L (ref 22–32)
Calcium: 8.4 mg/dL — ABNORMAL LOW (ref 8.9–10.3)
Chloride: 97 mmol/L — ABNORMAL LOW (ref 101–111)
Creatinine, Ser: 6.05 mg/dL — ABNORMAL HIGH (ref 0.61–1.24)
GFR calc Af Amer: 11 mL/min — ABNORMAL LOW (ref >60)
GFR calc non Af Amer: 10 mL/min — ABNORMAL LOW (ref >60)
GLUCOSE: 258 mg/dL — AB (ref 65–99)
PHOSPHORUS: 5.6 mg/dL — AB (ref 2.5–4.6)
POTASSIUM: 3.8 mmol/L (ref 3.5–5.1)
Sodium: 136 mmol/L (ref 135–145)

## 2017-07-01 LAB — GLUCOSE, CAPILLARY
Glucose-Capillary: 266 mg/dL — ABNORMAL HIGH (ref 65–99)
Glucose-Capillary: 174 mg/dL — ABNORMAL HIGH (ref 65–99)
Glucose-Capillary: 147 mg/dL — ABNORMAL HIGH (ref 65–99)
Glucose-Capillary: 146 mg/dL — ABNORMAL HIGH (ref 65–99)

## 2017-07-01 MED ORDER — CHLORHEXIDINE GLUCONATE CLOTH 2 % EX PADS
6.0000 | MEDICATED_PAD | Freq: Every day | CUTANEOUS | Status: DC
  Administered 2017-07-02 – 2017-07-03 (×2): 6 via TOPICAL

## 2017-07-01 MED ORDER — INSULIN GLARGINE 100 UNIT/ML ~~LOC~~ SOLN
7.0000 [IU] | Freq: Every day | SUBCUTANEOUS | Status: DC
  Administered 2017-07-01 – 2017-07-03 (×3): 7 [IU] via SUBCUTANEOUS
  Filled 2017-07-01 (×4): qty 0.07

## 2017-07-01 NOTE — Progress Notes (Signed)
Physical Therapy Treatment Patient Details Name: Jeffery Young MRN: [id number] DOB: 13-Mar-1965 Today's Date: 07/01/2017    History of Present Illness Pt. is a 52 y.o. M with significant PMH of DM, HTN, HLD, and CAD, recently hospitalized for a stroke. Now presenting with progressive renal failure and started on HD; recent L arm AVG placement.     PT Comments    Continuing work on functional mobility and activity tolerance;  Used an Surveyor, quantity for dorsiflexion assist LLE with some success -- pt does report it feels "different" than without, and he had less losses of balance with Ace Wrap today; paged Dr. Tana Coast for an order for an AFO for dorsiflexion assist LLE    Follow Up Recommendations  SNF     Equipment Recommendations  Rolling walker with 5" wheels    Recommendations for Other Services       Precautions / Restrictions Precautions Precautions: Fall Precaution Comments: LUE restrictions (recent AVG placement)    Mobility  Bed Mobility Overal bed mobility: Needs Assistance Bed Mobility: Supine to Sit     Supine to sit: Supervision     General bed mobility comments: Supervision for safety.   Transfers Overall transfer level: Needs assistance Equipment used: Rolling walker (2 wheeled) Transfers: Sit to/from Stand Sit to Stand: Min assist         General transfer comment: Min assist to power up and sustain standing balance.   Ambulation/Gait Ambulation/Gait assistance: Min assist;Min guard Ambulation Distance (Feet): 100 Feet Assistive device: Rolling walker (2 wheeled)(and ACE wrap for dorsiflexion assist) Gait Pattern/deviations: Decreased stance time - left;Steppage;Decreased dorsiflexion - left Gait velocity: decreased   General Gait Details: Trialed walking with ace wrap on L foot for dorsiflexion assist; noting occasional slight L toe drag during swing (no gross loss fo balance noted); toe catch/drag gets more pronounced with fatigue   Stairs              Wheelchair Mobility    Modified Rankin (Stroke Patients Only)       Balance     Sitting balance-Leahy Scale: Good       Standing balance-Leahy Scale: Poor                              Cognition Arousal/Alertness: Awake/alert Behavior During Therapy: Flat affect   Area of Impairment: Following commands;Safety/judgement;Problem solving                   Current Attention Level: Selective Memory: Decreased short-term memory Following Commands: Follows one step commands consistently Safety/Judgement: Decreased awareness of safety;Decreased awareness of deficits Awareness: Emergent Problem Solving: Decreased initiation;Requires verbal cues;Requires tactile cues General Comments: Pt reporting that he feels depressed due to his daughter and wife being in [place]. Difficulty problem solving and requiring max encouragement to participate.       Exercises      General Comments        Pertinent Vitals/Pain Pain Assessment: Faces Faces Pain Scale: Hurts little more Pain Location: Lower back Pain Descriptors / Indicators: Constant Pain Intervention(s): Monitored during session    Home Living                      Prior Function            PT Goals (current goals can now be found in the care plan section) Acute Rehab PT Goals Patient Stated Goal: walk better PT  Goal Formulation: With patient Time For Goal Achievement: 07/13/17 Potential to Achieve Goals: Fair Progress towards PT goals: Progressing toward goals    Frequency    Min 2X/week      PT Plan Current plan remains appropriate    Co-evaluation              AM-PAC PT "6 Clicks" Daily Activity  Outcome Measure  Difficulty turning over in bed (including adjusting bedclothes, sheets and blankets)?: None Difficulty moving from lying on back to sitting on the side of the bed? : A Little Difficulty sitting down on and standing up from a chair with arms (e.g.,  wheelchair, bedside commode, etc,.)?: A Little Help needed moving to and from a bed to chair (including a wheelchair)?: A Little Help needed walking in hospital room?: A Little Help needed climbing 3-5 steps with a railing? : A Lot 6 Click Score: 18    End of Session Equipment Utilized During Treatment: Gait belt;Other (comment)(Ace Wrap L for dorsiflexion assist) Activity Tolerance: Patient tolerated treatment well Patient left: in chair;with call bell/phone within reach;Other (comment)(attempted to turn on chair alarm) Nurse Communication: Mobility status PT Visit Diagnosis: Other abnormalities of gait and mobility (R26.89);Other symptoms and signs involving the nervous system (R29.898)     Time: 6712-4580 PT Time Calculation (min) (ACUTE ONLY): 22 min  Charges:  $Gait Training: 8-22 mins                    G Codes:       Roney Marion, PT  Acute Rehabilitation Services Pager [phone] Office [place] 07/01/2017, 1:08 PM

## 2017-07-01 NOTE — Progress Notes (Signed)
Orthopedic Tech Progress Note Patient Details:  Jeffery Young March 25, 1965 [id number]  Patient ID: [name], male   DOB: [birth year], 52 y.o.   MRN: [id number]   [place] Pink 07/01/2017, 2:15 PMCalled [employer] for left ankle foot orthosis.

## 2017-07-01 NOTE — Clinical Social Work Note (Signed)
CSW talked with patient regarding his discharge disposition and he gave permission for his wife to be contacted. Call made to Ms. Agustin [phone]) regarding patient. Wife was very pleasant, but made it clear that she and her husband have been separated for 5 years and he cannot discharge to her home. Mrs. Tietje explained that patient had his own place and ended up losing his apartment. He stayed with her briefly then had been staying with a friend, and it was doing this time that the health issues emerged. Per wife, as far as she knows patient is still employed by [employer] and is on [employer]. After the FMLA, his short-term disability will kick in. Ms. Toto reported that she has been assisting their 76 year old daughter with checking on things and needed paperwork with his job. Mrs. Harty feels patient's return to [facility] is the best d/c plan for patient.  Call made to [facility] with [facility] regarding patient and their initiating insurance auth. Per Juliann Pulse, she will inform Santiago Glad and they will initiate insurance auth if company open today, and if close, will contact them Tuesday, 5/28.  CSW will continue to follow and facilitate d/c to SNF once clipped and insurance auth received.  Janisse Ghan Givens, MSW, LCSW Licensed Clinical Social Worker [place] [phone]

## 2017-07-01 NOTE — Progress Notes (Signed)
ackground: 52 yo Hispanic man, PMH HTN, HLD, DM2, strokes (CT 05/2017 old lacunes), CAD prior PTCA. Was here 4/23-06/09/17 for weakness, neurovegetative sx. Discharged to SNF d/t inability to care for self or live alone, residing at [facility]. Initial renal eval done 05/2017 for creatinine of 5.8. All serologies were negative. 4.3 gm proteinuria. Had been taking NSAIDS so hope was for some possible recovery. Seen by Dr. Carolin Sicks at our office 06/21/17 for F/U of advanced CKD. Creatinine was up to 7.54, BUN 95, K 5.4.Sent to ED by [facility]. TDC/L upper AVG 5.24 Dr. Scot Dock. HD initiated 5.24.19 via [place].  Assessment/Recommendations  1. 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 Had twin with ESRD. 1. TDC and Left upper AVG done 5/24 Dr. Scot Dock 2. HD#1 5/24, #2  5/25 3. CLIP pending 2. Anemia (ESA and iron) 3. 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 H/O stroke with residual deficits-neg CT for acute last adm, just old lacunes. ASA, plavix (resumed), statin 5. Disposition - once CLIP done could return to SNF. Need to know what SNF going to for outpt HD placement.  Subjective: Interval History: Tol HD yesterday  Objective: Vital signs in last 24 hours: Temp:  [97.5 F (36.4 C)-98 F (36.7 C)] 98 F (36.7 C) (05/27 0925) Pulse Rate:  [66-84] 84 (05/27 0925) Resp:  [16-20] 20 (05/27 0925) BP: (149-152)/(66-85) 152/85 (05/27 0925) SpO2:  [95 %-97 %] 97 % (05/27 0925) Weight:  [58.1 kg (128 lb 1.5 oz)] 58.1 kg (128 lb 1.5 oz) (05/26 2110) Weight change: 1.403 kg (3 lb 1.5 oz)  Intake/Output from previous day: 05/26 0701 - 05/27 0700 In: 840 [P.O.:840] Out: 875 [Urine:875] Intake/Output this shift: Total I/O In: 240 [P.O.:240] Out: 675 [Urine:675]  General appearance: alert and cooperative Back: negative, symmetric, no curvature. ROM normal. No CVA tenderness. Resp: clear to auscultation bilaterally Cardio: regular rate and rhythm, S1, S2 normal, no murmur, click, rub or gallop Extremities: extremities normal,  atraumatic, no cyanosis or edema, LUE AVGG, LLE bandaged  Lab Results: Recent Labs    06/29/17 0747  WBC 10.5  HGB 9.1*  HCT 29.4*  PLT 210   BMET:  Recent Labs    06/30/17 0553 07/01/17 0349  NA 137 136  K 4.3 3.8  CL 98* 97*  CO2 28 28  GLUCOSE 173* 258*  BUN 43* 48*  CREATININE 5.51* 6.05*  CALCIUM 8.5* 8.4*   No results for input(s): PTH in the last 72 hours. Iron Studies: No results for input(s): IRON, TIBC, TRANSFERRIN, FERRITIN in the last 72 hours. Studies/Results: No results found.  Scheduled: . amLODipine  10 mg Oral QHS  . aspirin  81 mg Oral Daily  . calcitRIOL  0.25 mcg Oral Daily  . carvedilol  25 mg Oral BID WC  . clopidogrel  75 mg Oral Daily  . docusate sodium  100 mg Oral BID  . enoxaparin (LOVENOX) injection  30 mg Subcutaneous Q24H  . hydrALAZINE  100 mg Oral TID  . insulin aspart  0-5 Units Subcutaneous QHS  . insulin aspart  0-9 Units Subcutaneous TID WC  . insulin aspart  3 Units Subcutaneous TID WC  . isosorbide mononitrate  120 mg Oral Daily  . sertraline  50 mg Oral Daily  . sevelamer carbonate  800 mg Oral TID WC  . simvastatin  20 mg Oral Daily  . sodium chloride flush  3 mL Intravenous Q12H  . traZODone  50 mg Oral QHS   Continuous:     LOS: 6 days  Estanislado Emms 07/01/2017,12:03 PM

## 2017-07-01 NOTE — Progress Notes (Signed)
[facility]                                                                              Patient Demographics  Jeffery Young, is a 52 y.o. male, DOB - 09-07-1965, WLN:[id number]  Admit date - 06/25/2017   Admitting Physician Karmen Bongo, MD  Outpatient Primary MD for the patient is [name], MD  Outpatient specialists:   LOS - 6  days   Medical records reviewed and are as summarized below:    No chief complaint on file.      Brief summary    Jeffery Young is a 52 y.o. male with medical history significant of DM; HTN; HLD; and CAD, CKD, history of TIAs, presented with generalized weakness, terms of uremia, worsening creatinine. He does still make urine - voids about 3-4 times daily.  This time, he was having ongoing fatigue and he saw nephrology; they suggested he come in to start HD. He was previously hospitalized from 4/23-5/5 for acute renal failure on CKD with progression to Stage V CKD.  He has had vein mapping performed and was planning to have access placement as an outpatient.    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  HD on 5/24 and 5/25 done, tolerated. -Previously from [facility] care, patient will need skilled nursing facility, inability to care for self or live alone.  Has been separated from his wife.  Also needs CLIP.    Active Problems:   Type 2 diabetes mellitus with diabetic nephropathy, without long-term current use of insulin ([place]), uncontrolled with hyperglycemia -CBG still somewhat elevated, started on Lantus 7 units daily at bedtime, continue NovoLog meal coverage, sliding scale insulin -Hemoglobin A1c 6.9 on 4/24    Essential hypertension -BP now  stable, continue Coreg, hydralazine    Hypercholesteremia  - continue simvastatin    Chronic combined systolic and diastolic heart failure ([place]) -Echo 1/18 showed preserved EF, grade 1 diastolic dysfunction -Volume control with hemodialysis    Depression Continue Zoloft    Normocytic anemia likely anemia of chronic kidney disease H&H stable  History of CVA -Continue aspirin, Plavix, Imdur, statin, stable    Code Status: Full CODE STATUS DVT Prophylaxis: Lovenox Family Communication: Discussed in detail with the patient, all imaging results, lab results explained to the patient    Disposition Plan: Need skilled nursing facility and CLIP  Time Spent in minutes 25-minutes  Procedures:  None  Consultants:   Nephrology Vascular surgery  Antimicrobials:      Medications  Scheduled Meds: . amLODipine  10 mg Oral QHS  . aspirin  81 mg Oral Daily  . calcitRIOL  0.25 mcg Oral Daily  . carvedilol  25 mg Oral BID WC  . [START ON 07/02/2017] Chlorhexidine Gluconate Cloth  6 each Topical Q0600  . clopidogrel  75 mg Oral Daily  . docusate sodium  100 mg Oral  BID  . enoxaparin (LOVENOX) injection  30 mg Subcutaneous Q24H  . hydrALAZINE  100 mg Oral TID  . insulin aspart  0-5 Units Subcutaneous QHS  . insulin aspart  0-9 Units Subcutaneous TID WC  . insulin aspart  3 Units Subcutaneous TID WC  . isosorbide mononitrate  120 mg Oral Daily  . sertraline  50 mg Oral Daily  . sevelamer carbonate  800 mg Oral TID WC  . simvastatin  20 mg Oral Daily  . sodium chloride flush  3 mL Intravenous Q12H  . traZODone  50 mg Oral QHS   Continuous Infusions:  PRN Meds:.acetaminophen **OR** acetaminophen, calcium carbonate (dosed in mg elemental calcium), camphor-menthol **AND** hydrOXYzine, docusate sodium, feeding supplement (NEPRO CARB STEADY), ondansetron **OR** ondansetron (ZOFRAN) IV, oxyCODONE-acetaminophen, sorbitol, zolpidem   Antibiotics   Anti-infectives (From admission,  onward)   Start     Dose/Rate Route Frequency Ordered Stop   06/28/17 0600  ceFAZolin (ANCEF) IVPB 1 g/50 mL premix    Note to Pharmacy:  Send with pt to OR   1 g 100 mL/hr over 30 Minutes Intravenous To [facility] 06/26/17 0814 06/28/17 1356   06/28/17 0000  ceFAZolin (ANCEF) IVPB 1 g/50 mL premix  Status:  Discontinued    Note to Pharmacy:  Send with pt to OR   1 g 100 mL/hr over 30 Minutes Intravenous On call 06/27/17 0815 06/27/17 2536        Subjective:   Jeffery Young was seen and examined today.  No complaints, no acute issues overnight.  Patient denies dizziness, chest pain, shortness of breath, abdominal pain, N/V/D/C.  No acute events overnight  Objective:   Vitals:   06/30/17 0924 06/30/17 2110 07/01/17 0535 07/01/17 0925  BP: (!) 157/76 (!) 149/69 (!) 150/66 (!) 152/85  Pulse: 76 66 66 84  Resp: 20 18 16 20   Temp: 97.9 F (36.6 C) (!) 97.5 F (36.4 C) 97.9 F (36.6 C) 98 F (36.7 C)  TempSrc: Oral Oral Oral Oral  SpO2: 96% 95% 95% 97%  Weight:  58.1 kg (128 lb 1.5 oz)    Height:        Intake/Output Summary (Last 24 hours) at 07/01/2017 1340 Last data filed at 07/01/2017 1023 Gross per 24 hour  Intake 840 ml  Output 1275 ml  Net -435 ml     Wt Readings from Last 3 Encounters:  06/30/17 58.1 kg (128 lb 1.5 oz)  06/09/17 55.7 kg (122 lb 12.8 oz)  11/20/16 58.9 kg (129 lb 12.8 oz)     Exam  General: Alert and oriented x 3, NAD Eyes:  HEENT:  Atraumatic, normocephalic Cardiovascular: S1 S2 auscultated, RRR. No pedal edema b/l Respiratory: Clear to auscultation bilaterally, no wheezing, rales or rhonchi Gastrointestinal: Soft, nontender, nondistended, + bowel sounds Ext: no pedal edema bilaterally Neuro: no new deficit Musculoskeletal: No digital cyanosis, clubbing Skin: No rashes Psych: Normal affect and demeanor, alert and oriented x3       Data Reviewed:  I have personally reviewed following labs and imaging studies  Micro  Results Recent Results (from the past 240 hour(s))  Surgical pcr screen     Status: None   Collection Time: 06/28/17  7:00 AM  Result Value Ref Range Status   MRSA, PCR NEGATIVE NEGATIVE Final   Staphylococcus aureus NEGATIVE NEGATIVE Final    Comment: (NOTE) The Xpert SA Assay (FDA approved for NASAL specimens in patients 49 years of age and older), is one component of a  comprehensive surveillance program. It is not intended to diagnose infection nor to guide or monitor treatment. Performed at [facility], [place] [address] [place], [place] [address]     Radiology Reports Dg Chest Platinum 1 View  Result Date: 06/28/2017 CLINICAL DATA:  52 year old male with a history of dialysis catheter placement EXAM: PORTABLE CHEST 1 VIEW COMPARISON:  05/28/2017 FINDINGS: Cardiomediastinal silhouette unchanged in size and contour. Coronary stents in place. Interval placement of right IJ approach hemodialysis catheter with the tip appearing to terminate superior vena cava. No confluent airspace disease. No pneumothorax or pleural effusion. No acute displaced fracture. IMPRESSION: Interval placement of right IJ approach hemodialysis catheter, with no complicating features. Electronically Signed   By: Corrie Mckusick D.O.   On: 06/28/2017 17:12    Lab Data:  CBC: Recent Labs  Lab 06/25/17 1321 06/26/17 0325 06/28/17 0651 06/29/17 0747  WBC 6.2 6.3 7.6 10.5  NEUTROABS 4.6  --   --   --   HGB 10.5* 11.1* 10.8* 9.1*  HCT 32.9* 34.9* 33.8* 29.4*  MCV 92.7 93.6 93.6 95.1  PLT 278 264 278 938   Basic Metabolic Panel: Recent Labs  Lab 06/25/17 1321  06/27/17 0632 06/28/17 0651 06/29/17 0510 06/30/17 0553 07/01/17 0349  NA 131*   < > 134* 135 133* 137 136  K 5.2*   < > 4.5 5.0 4.9 4.3 3.8  CL 90*   < > 93* 94* 95* 98* 97*  CO2 28   < > 27 27 26 28 28   GLUCOSE 321*   < > 167* 141* 361* 173* 258*  BUN 92*   < > 90* 88* 67* 43* 48*  CREATININE 7.89*   < > 7.81* 7.98* 6.43* 5.51* 6.05*    CALCIUM 9.1   < > 9.1 9.0 8.1* 8.5* 8.4*  MG 2.7*  --   --   --   --   --   --   PHOS  --    < > 6.8* 7.4* 6.2* 5.9* 5.6*   < > = values in this interval not displayed.   GFR: Estimated Creatinine Clearance: 11.9 mL/min (A) (by C-G formula based on SCr of 6.05 mg/dL (H)). Liver Function Tests: Recent Labs  Lab 06/27/17 1017 06/28/17 0651 06/29/17 0510 06/30/17 0553 07/01/17 0349  ALBUMIN 3.1* 3.2* 2.6* 2.8* 2.8*   No results for input(s): LIPASE, AMYLASE in the last 168 hours. No results for input(s): AMMONIA in the last 168 hours. Coagulation Profile: Recent Labs  Lab 06/28/17 0651  INR 1.14   Cardiac Enzymes: No results for input(s): CKTOTAL, CKMB, CKMBINDEX, TROPONINI in the last 168 hours. BNP (last 3 results) No results for input(s): PROBNP in the last 8760 hours. HbA1C: No results for input(s): HGBA1C in the last 72 hours. CBG: Recent Labs  Lab 06/30/17 1152 06/30/17 1718 06/30/17 2110 07/01/17 0731 07/01/17 1132  GLUCAP 127* 129* 239* 266* 174*   Lipid Profile: No results for input(s): CHOL, HDL, LDLCALC, TRIG, CHOLHDL, LDLDIRECT in the last 72 hours. Thyroid Function Tests: No results for input(s): TSH, T4TOTAL, FREET4, T3FREE, THYROIDAB in the last 72 hours. Anemia Panel: No results for input(s): VITAMINB12, FOLATE, FERRITIN, TIBC, IRON, RETICCTPCT in the last 72 hours. Urine analysis:    Component Value Date/Time   COLORURINE YELLOW 06/25/2017 1300   APPEARANCEUR CLEAR 06/25/2017 1300   LABSPEC 1.009 06/25/2017 1300   PHURINE 7.0 06/25/2017 1300   GLUCOSEU >=500 (A) 06/25/2017 1300   HGBUR NEGATIVE 06/25/2017 1300  BILIRUBINUR NEGATIVE 06/25/2017 1300   KETONESUR NEGATIVE 06/25/2017 1300   PROTEINUR 100 (A) 06/25/2017 1300   UROBILINOGEN 1.0 08/21/2012 2250   NITRITE NEGATIVE 06/25/2017 1300   LEUKOCYTESUR NEGATIVE 06/25/2017 1300     Ripudeep Rai M.D. [facility] 07/01/2017, 1:40 PM  Pager: [phone] Between 7am to 7pm - call  Pager - [phone]  After 7pm go to [email or web address] - password [email or web address]  Call night coverage person covering after 7pm

## 2017-07-02 ENCOUNTER — Telehealth: Payer: Self-pay | Admitting: Vascular Surgery

## 2017-07-02 LAB — GLUCOSE, CAPILLARY
GLUCOSE-CAPILLARY: 222 mg/dL — AB (ref 65–99)
GLUCOSE-CAPILLARY: 236 mg/dL — AB (ref 65–99)
GLUCOSE-CAPILLARY: 161 mg/dL — AB (ref 65–99)
Glucose-Capillary: 247 mg/dL — ABNORMAL HIGH (ref 65–99)

## 2017-07-02 LAB — RENAL FUNCTION PANEL
Albumin: 3 g/dL — ABNORMAL LOW (ref 3.5–5.0)
Anion gap: 11 (ref 5–15)
BUN: 54 mg/dL — AB (ref 6–20)
CHLORIDE: 99 mmol/L — AB (ref 101–111)
CO2: 26 mmol/L (ref 22–32)
Calcium: 8.9 mg/dL (ref 8.9–10.3)
Creatinine, Ser: 6.53 mg/dL — ABNORMAL HIGH (ref 0.61–1.24)
GFR calc Af Amer: 10 mL/min — ABNORMAL LOW (ref >60)
GFR, EST NON AFRICAN AMERICAN: 9 mL/min — AB (ref >60)
Glucose, Bld: 162 mg/dL — ABNORMAL HIGH (ref 65–99)
Phosphorus: 5 mg/dL — ABNORMAL HIGH (ref 2.5–4.6)
Potassium: 4.1 mmol/L (ref 3.5–5.1)
Sodium: 136 mmol/L (ref 135–145)

## 2017-07-02 MED ORDER — HEPARIN SODIUM (PORCINE) 1000 UNIT/ML DIALYSIS: 20.0000 [IU]/kg | INTRAMUSCULAR | Status: DC | PRN

## 2017-07-02 MED ORDER — HEPARIN SODIUM (PORCINE) 1000 UNIT/ML DIALYSIS: 1000.0000 [IU] | INTRAMUSCULAR | Status: DC | PRN

## 2017-07-02 MED ORDER — LIDOCAINE HCL (PF) 1 % IJ SOLN: 5.0000 mL | INTRAMUSCULAR | Status: DC | PRN

## 2017-07-02 MED ORDER — SODIUM CHLORIDE 0.9 % IV SOLN: 100.0000 mL | INTRAVENOUS | Status: DC | PRN

## 2017-07-02 MED ORDER — PENTAFLUOROPROP-TETRAFLUOROETH EX AERO: 1.0000 "application " | INHALATION_SPRAY | CUTANEOUS | Status: DC | PRN

## 2017-07-02 MED ORDER — ALTEPLASE 2 MG IJ SOLR: 2.0000 mg | Freq: Once | INTRAMUSCULAR | Status: DC | PRN

## 2017-07-02 MED ORDER — LIDOCAINE-PRILOCAINE 2.5-2.5 % EX CREA: 1.0000 "application " | TOPICAL_CREAM | CUTANEOUS | Status: DC | PRN

## 2017-07-02 MED ORDER — CHLORHEXIDINE GLUCONATE CLOTH 2 % EX PADS: 6.0000 | MEDICATED_PAD | Freq: Every day | CUTANEOUS | Status: DC

## 2017-07-02 MED FILL — Thrombin For Soln 20000 Unit: CUTANEOUS | Qty: 1 | Status: AC

## 2017-07-02 NOTE — Progress Notes (Signed)
ackground: 52 yo Hispanic man, PMH HTN, HLD, DM2, strokes (CT 05/2017 old lacunes), CAD prior PTCA. Was here 4/23-06/09/17 for weakness, neurovegetative sx. Discharged to SNF d/t inability to care for self or live alone, residing at [facility]. Initial renal eval done 05/2017 for creatinine of 5.8. All serologies were negative. 4.3 gm proteinuria. Had been taking NSAIDS so hope was for some possible recovery. Seen by Dr. Carolin Sicks at our office 06/21/17 for F/U of advanced CKD. Creatinine was up to 7.54, BUN 95, K 5.4.Sent to ED by [facility]. TDC/L upper AVG 5.24 Dr. Scot Dock. HD initiated 5.24.19 via [place].  Assessment/Recommendations  1. 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 Had twin with ESRD.  Currently TTS 1. TDC andLeft upperAVG done 5/24 Dr. Scot Dock 2. HD#1 5/24, #2 5/25 3. CLIP pending 2. Anemia (ESA and iron) 3. 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 H/O stroke with residual deficits-neg CT for acute last adm, just old lacunes. ASA, plavix (resumed), statin 5. Disposition - once CLIP done could return to SNF. Need to know what SNF going to for outpt HD placement.  Subjective: Interval History: Had dialysis earlier today  Objective: Vital signs in last 24 hours: Temp:  [98 F (36.7 C)-99.2 F (37.3 C)] 98.3 F (36.8 C) (05/28 1033) Pulse Rate:  [72-84] 79 (05/28 1033) Resp:  [18-20] 20 (05/28 1033) BP: (141-165)/(70-80) 152/72 (05/28 1033) SpO2:  [91 %-98 %] 96 % (05/28 1033) Weight:  [55.4 kg (122 lb 2.2 oz)-58.1 kg (128 lb 1.7 oz)] 55.4 kg (122 lb 2.2 oz) (05/28 1000) Weight change: 0.006 kg (0.2 oz)  Intake/Output from previous day: 05/27 0701 - 05/28 0700 In: 720 [P.O.:720] Out: 1925 [Urine:1925] Intake/Output this shift: Total I/O In: -  Out: 1000 [Other:1000]  General appearance: no change in exam  Lab Results: No results for input(s): WBC, HGB, HCT, PLT in the last 72 hours. BMET:  Recent Labs    07/01/17 0349 07/02/17 0535  NA 136 136  K 3.8 4.1  CL 97* 99*  CO2 28 26  GLUCOSE 258*  162*  BUN 48* 54*  CREATININE 6.05* 6.53*  CALCIUM 8.4* 8.9   No results for input(s): PTH in the last 72 hours. Iron Studies: No results for input(s): IRON, TIBC, TRANSFERRIN, FERRITIN in the last 72 hours. Studies/Results: No results found.  Scheduled: . amLODipine  10 mg Oral QHS  . aspirin  81 mg Oral Daily  . calcitRIOL  0.25 mcg Oral Daily  . carvedilol  25 mg Oral BID WC  . Chlorhexidine Gluconate Cloth  6 each Topical Q0600  . clopidogrel  75 mg Oral Daily  . docusate sodium  100 mg Oral BID  . enoxaparin (LOVENOX) injection  30 mg Subcutaneous Q24H  . hydrALAZINE  100 mg Oral TID  . insulin aspart  0-5 Units Subcutaneous QHS  . insulin aspart  0-9 Units Subcutaneous TID WC  . insulin aspart  3 Units Subcutaneous TID WC  . insulin glargine  7 Units Subcutaneous QHS  . isosorbide mononitrate  120 mg Oral Daily  . sertraline  50 mg Oral Daily  . sevelamer carbonate  800 mg Oral TID WC  . simvastatin  20 mg Oral Daily  . sodium chloride flush  3 mL Intravenous Q12H  . traZODone  50 mg Oral QHS    LOS: 7 days   Estanislado Emms 07/02/2017,3:02 PM

## 2017-07-02 NOTE — Telephone Encounter (Signed)
sch appt lvm 07/30/17 1pm p/o PA s/p LUA AVG per stf msg

## 2017-07-02 NOTE — Telephone Encounter (Signed)
-----   Message from Mena Goes, RN sent at 07/01/2017  5:06 PM EDT ----- Regarding: 4 weeks PA Clinic   ----- Message ----- From: Conrad [place], MD Sent: 06/29/2017   8:31 AM To: Vvs Charge Pool  Jeffery Young [id number] July 21, 1965  Follow-up: 4 weeks in PA clinic for LUA AVG

## 2017-07-02 NOTE — Progress Notes (Signed)
PT Cancellation Note  Patient Details Name: Jeffery Young MRN: [id number] DOB: [birth year]   Cancelled Treatment:    Reason Eval/Treat Not Completed: Patient at procedure or test/unavailable   Currently in HD;  Will follow up later today as time allows;  Otherwise, will follow up for PT at a later date;   Appreciate order for L AFO -- Bobby from [employer] was in to see pt yesterday;  Thank you,  Roney Marion, PT  Acute Rehabilitation Services Pager [phone] Office [phone]     [place] 07/02/2017, 8:07 AM

## 2017-07-02 NOTE — Progress Notes (Signed)
[facility]                                                                              Patient Demographics  Jeffery Young, is a 52 y.o. male, DOB - 1965/02/16, WLN:[id number]  Admit date - 06/25/2017   Admitting Physician Karmen Bongo, MD  Outpatient Primary MD for the patient is [name], MD  Outpatient specialists:   LOS - 7  days   Medical records reviewed and are as summarized below:    No chief complaint on file.      Brief summary    Jeffery Young is a 52 y.o. male with medical history significant of DM; HTN; HLD; and CAD, CKD, history of TIAs, presented with generalized weakness, terms of uremia, worsening creatinine. He does still make urine - voids about 3-4 times daily.  This time, he was having ongoing fatigue and he saw nephrology; they suggested he come in to start HD. He was previously hospitalized from 4/23-5/5 for acute renal failure on CKD with progression to Stage V CKD.  He has had vein mapping performed and was planning to have access placement as an outpatient.    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  HD on 5/24, 5/25 tolerated.  Receiving hemodialysis today. -Previously from [facility], patient will need skilled nursing facility, inability to care for self or live alone.  Has been separated from his wife.  Also needs CLIP.    Active Problems:   Type 2 diabetes mellitus with diabetic nephropathy, without long-term current use of insulin ([place]), uncontrolled with hyperglycemia -CBGs improving, continue Lantus 7 units at bedtime, NovoLog meal coverage and sliding scale insulin.  -Hemoglobin A1c 6.9 on 4/24    Essential hypertension -BP stable,  continue Coreg, hydralazine     Hypercholesteremia  - continue simvastatin    Chronic combined systolic and diastolic heart failure ([place]) -Echo 1/18 showed preserved EF, grade 1 diastolic dysfunction -Volume control with hemodialysis    Depression Continue Zoloft    Normocytic anemia likely anemia of chronic kidney disease H&H stable  History of CVA -Continue aspirin, Plavix, Imdur, statin, stable    Code Status: Full CODE STATUS DVT Prophylaxis: Lovenox Family Communication: Discussed in detail with the patient, all imaging results, lab results explained to the patient    Disposition Plan: Need skilled nursing facility and CLIP  Time Spent in minutes 15-minutes  Procedures:  Hemodialysis On 5/24 1. Ultrasound-guided placement of right IJ 23 cm tunneled dialysis catheter 2.  New left upper arm AV graft (4-7 mm PTFE graft)   Consultants:   Nephrology Vascular surgery  Antimicrobials:      Medications  Scheduled Meds: . amLODipine  10 mg Oral QHS  . aspirin  81 mg Oral Daily  . calcitRIOL  0.25 mcg Oral Daily  . carvedilol  25 mg Oral BID WC  . Chlorhexidine Gluconate  Cloth  6 each Topical [id number]  . clopidogrel  75 mg Oral Daily  . docusate sodium  100 mg Oral BID  . enoxaparin (LOVENOX) injection  30 mg Subcutaneous Q24H  . hydrALAZINE  100 mg Oral TID  . insulin aspart  0-5 Units Subcutaneous QHS  . insulin aspart  0-9 Units Subcutaneous TID WC  . insulin aspart  3 Units Subcutaneous TID WC  . insulin glargine  7 Units Subcutaneous QHS  . isosorbide mononitrate  120 mg Oral Daily  . sertraline  50 mg Oral Daily  . sevelamer carbonate  800 mg Oral TID WC  . simvastatin  20 mg Oral Daily  . sodium chloride flush  3 mL Intravenous Q12H  . traZODone  50 mg Oral QHS   Continuous Infusions:  PRN Meds:.acetaminophen **OR** acetaminophen, calcium carbonate (dosed in mg elemental calcium), camphor-menthol **AND** hydrOXYzine, docusate sodium, feeding  supplement (NEPRO CARB STEADY), ondansetron **OR** ondansetron (ZOFRAN) IV, oxyCODONE-acetaminophen, sorbitol, zolpidem   Antibiotics   Anti-infectives (From admission, onward)   Start     Dose/Rate Route Frequency Ordered Stop   06/28/17 0600  ceFAZolin (ANCEF) IVPB 1 g/50 mL premix    Note to Pharmacy:  Send with pt to OR   1 g 100 mL/hr over 30 Minutes Intravenous To [facility] 06/26/17 0814 06/28/17 1356   06/28/17 0000  ceFAZolin (ANCEF) IVPB 1 g/50 mL premix  Status:  Discontinued    Note to Pharmacy:  Send with pt to OR   1 g 100 mL/hr over 30 Minutes Intravenous On call 06/27/17 0815 06/27/17 9024        Subjective:   Jeffery Young was seen and examined today.  No complaints.  Patient denies dizziness, chest pain, shortness of breath, abdominal pain, N/V/D/C.  No acute events overnight  Objective:   Vitals:   07/02/17 0900 07/02/17 0930 07/02/17 1000 07/02/17 1033  BP: (!) 156/70 (!) 156/71 (!) 160/70 (!) 152/72  Pulse: 80 80 76 79  Resp:   20 20  Temp:   98 F (36.7 C) 98.3 F (36.8 C)  TempSrc:   Oral Oral  SpO2:   96% 96%  Weight:   55.4 kg (122 lb 2.2 oz)   Height:        Intake/Output Summary (Last 24 hours) at 07/02/2017 1239 Last data filed at 07/02/2017 1000 Gross per 24 hour  Intake 480 ml  Output 2250 ml  Net -1770 ml     Wt Readings from Last 3 Encounters:  07/02/17 55.4 kg (122 lb 2.2 oz)  06/09/17 55.7 kg (122 lb 12.8 oz)  11/20/16 58.9 kg (129 lb 12.8 oz)     Exam  General: Alert and oriented x 3, NAD Eyes:  HEENT:   Cardiovascular: S1 S2 auscultated, RRR, No pedal edema b/l Respiratory: CTA B Gastrointestinal: Soft, nontender, nondistended, + bowel sounds Ext: no pedal edema bilaterally Neuro: no neuro deficit Musculoskeletal: No digital cyanosis, clubbing Skin: No rashes Psych: Normal affect and demeanor, alert and oriented x3     Data Reviewed:  I have personally reviewed following labs and imaging studies  Micro  Results Recent Results (from the past 240 hour(s))  Surgical pcr screen     Status: None   Collection Time: 06/28/17  7:00 AM  Result Value Ref Range Status   MRSA, PCR NEGATIVE NEGATIVE Final   Staphylococcus aureus NEGATIVE NEGATIVE Final    Comment: (NOTE) The Xpert SA Assay (FDA approved for NASAL specimens in patients 22  years of age and older), is one component of a comprehensive surveillance program. It is not intended to diagnose infection nor to guide or monitor treatment. Performed at [facility], [place] [address], [place], [place] [address]     Radiology Reports Dg Chest [place] 1 View  Result Date: 06/28/2017 CLINICAL DATA:  52 year old male with a history of dialysis catheter placement EXAM: PORTABLE CHEST 1 VIEW COMPARISON:  05/28/2017 FINDINGS: Cardiomediastinal silhouette unchanged in size and contour. Coronary stents in place. Interval placement of right IJ approach hemodialysis catheter with the tip appearing to terminate superior vena cava. No confluent airspace disease. No pneumothorax or pleural effusion. No acute displaced fracture. IMPRESSION: Interval placement of right IJ approach hemodialysis catheter, with no complicating features. Electronically Signed   By: Corrie Mckusick D.O.   On: 06/28/2017 17:12    Lab Data:  CBC: Recent Labs  Lab 06/25/17 1321 06/26/17 0325 06/28/17 0651 06/29/17 0747  WBC 6.2 6.3 7.6 10.5  NEUTROABS 4.6  --   --   --   HGB 10.5* 11.1* 10.8* 9.1*  HCT 32.9* 34.9* 33.8* 29.4*  MCV 92.7 93.6 93.6 95.1  PLT 278 264 278 737   Basic Metabolic Panel: Recent Labs  Lab 06/25/17 1321  06/28/17 0651 06/29/17 0510 06/30/17 0553 07/01/17 0349 07/02/17 0535  NA 131*   < > 135 133* 137 136 136  K 5.2*   < > 5.0 4.9 4.3 3.8 4.1  CL 90*   < > 94* 95* 98* 97* 99*  CO2 28   < > 27 26 28 28 26   GLUCOSE 321*   < > 141* 361* 173* 258* 162*  BUN 92*   < > 88* 67* 43* 48* 54*  CREATININE 7.89*   < > 7.98* 6.43* 5.51* 6.05* 6.53*    CALCIUM 9.1   < > 9.0 8.1* 8.5* 8.4* 8.9  MG 2.7*  --   --   --   --   --   --   PHOS  --    < > 7.4* 6.2* 5.9* 5.6* 5.0*   < > = values in this interval not displayed.   GFR: Estimated Creatinine Clearance: 10.5 mL/min (A) (by C-G formula based on SCr of 6.53 mg/dL (H)). Liver Function Tests: Recent Labs  Lab 06/28/17 0651 06/29/17 0510 06/30/17 0553 07/01/17 0349 07/02/17 0535  ALBUMIN 3.2* 2.6* 2.8* 2.8* 3.0*   No results for input(s): LIPASE, AMYLASE in the last 168 hours. No results for input(s): AMMONIA in the last 168 hours. Coagulation Profile: Recent Labs  Lab 06/28/17 0651  INR 1.14   Cardiac Enzymes: No results for input(s): CKTOTAL, CKMB, CKMBINDEX, TROPONINI in the last 168 hours. BNP (last 3 results) No results for input(s): PROBNP in the last 8760 hours. HbA1C: No results for input(s): HGBA1C in the last 72 hours. CBG: Recent Labs  Lab 06/30/17 2110 07/01/17 0731 07/01/17 1132 07/01/17 1647 07/01/17 2057  GLUCAP 239* 266* 174* 147* 146*   Lipid Profile: No results for input(s): CHOL, HDL, LDLCALC, TRIG, CHOLHDL, LDLDIRECT in the last 72 hours. Thyroid Function Tests: No results for input(s): TSH, T4TOTAL, FREET4, T3FREE, THYROIDAB in the last 72 hours. Anemia Panel: No results for input(s): VITAMINB12, FOLATE, FERRITIN, TIBC, IRON, RETICCTPCT in the last 72 hours. Urine analysis:    Component Value Date/Time   COLORURINE YELLOW 06/25/2017 1300   APPEARANCEUR CLEAR 06/25/2017 1300   LABSPEC 1.009 06/25/2017 1300   PHURINE 7.0 06/25/2017 1300   GLUCOSEU >=500 (  A) 06/25/2017 1300   HGBUR NEGATIVE 06/25/2017 1300   BILIRUBINUR NEGATIVE 06/25/2017 1300   KETONESUR NEGATIVE 06/25/2017 1300   PROTEINUR 100 (A) 06/25/2017 1300   UROBILINOGEN 1.0 08/21/2012 2250   NITRITE NEGATIVE 06/25/2017 1300   LEUKOCYTESUR NEGATIVE 06/25/2017 1300     Ripudeep Rai M.D. [facility] 07/02/2017, 12:39 PM  Pager: [phone] Between 7am to 7pm - call  Pager - [phone]  After 7pm go to [email or web address] - password [email or web address]  Call night coverage person covering after 7pm

## 2017-07-02 NOTE — Progress Notes (Signed)
Physical Therapy Treatment Patient Details Name: Jeffery Young MRN: [id number] DOB: 1965-06-19 Today's Date: 07/02/2017    History of Present Illness Pt. is a 52 y.o. M with significant PMH of DM, HTN, HLD, and CAD, recently hospitalized for a stroke. Now presenting with progressive renal failure and started on HD; recent L arm AVG placement.     PT Comments    Continuing work on functional mobility and activity tolerance;  Agreeable to getting up and walking even after HD; Walked with L AFO ([employer] delivered yesterday), and with notable increased step length and ease with swing; still with occasional toe catch at initial swing/toe off which becomes more pronounced with fatigue   Follow Up Recommendations  SNF     Equipment Recommendations  Rolling walker with 5" wheels    Recommendations for Other Services       Precautions / Restrictions Precautions Precautions: Fall    Mobility  Bed Mobility Overal bed mobility: Needs Assistance Bed Mobility: Supine to Sit     Supine to sit: Supervision     General bed mobility comments: Supervision for safety.   Transfers Overall transfer level: Needs assistance Equipment used: Rolling walker (2 wheeled) Transfers: Sit to/from Stand Sit to Stand: Min guard         General transfer comment: Close guard for safety, but not needing physical asssit  Ambulation/Gait Ambulation/Gait assistance: Min assist;Min guard Ambulation Distance (Feet): 100 Feet Assistive device: Rolling walker (2 wheeled) Gait Pattern/deviations: Decreased stance time - left;Steppage;Decreased dorsiflexion - left Gait velocity: decreased   General Gait Details: Trialed walking with AFO on L foot for dorsiflexion assist; Notable improvement in L step length with AFO;  noting occasional slight L toe drag during swing (no gross loss fo balance noted); toe catch/drag gets more pronounced with fatigue   Stairs             Wheelchair Mobility     Modified Rankin (Stroke Patients Only)       Balance     Sitting balance-Leahy Scale: Good       Standing balance-Leahy Scale: Poor                              Cognition Arousal/Alertness: Awake/alert Behavior During Therapy: Flat affect                                   General Comments: Still needing encouragement to participate      Exercises      General Comments        Pertinent Vitals/Pain Pain Assessment: Faces Faces Pain Scale: Hurts little more Pain Location: Lower back, especially when trying to don shoes Pain Descriptors / Indicators: Constant Pain Intervention(s): Monitored during session    Home Living                      Prior Function            PT Goals (current goals can now be found in the care plan section) Acute Rehab PT Goals Patient Stated Goal: walk better PT Goal Formulation: With patient Time For Goal Achievement: 07/13/17 Potential to Achieve Goals: Fair Progress towards PT goals: Progressing toward goals    Frequency    Min 2X/week      PT Plan Current plan remains appropriate    Co-evaluation  AM-PAC PT "6 Clicks" Daily Activity  Outcome Measure  Difficulty turning over in bed (including adjusting bedclothes, sheets and blankets)?: None Difficulty moving from lying on back to sitting on the side of the bed? : None Difficulty sitting down on and standing up from a chair with arms (e.g., wheelchair, bedside commode, etc,.)?: A Little Help needed moving to and from a bed to chair (including a wheelchair)?: A Little Help needed walking in hospital room?: A Little Help needed climbing 3-5 steps with a railing? : A Lot 6 Click Score: 19    End of Session Equipment Utilized During Treatment: Gait belt(L AFO) Activity Tolerance: Patient tolerated treatment well Patient left: in bed;with call bell/phone within reach Nurse Communication: Mobility status PT Visit  Diagnosis: Other abnormalities of gait and mobility (R26.89);Other symptoms and signs involving the nervous system (R29.898)     Time: 4401-0272 PT Time Calculation (min) (ACUTE ONLY): 20 min  Charges:  $Gait Training: 8-22 mins                    G Codes:        Roney Marion, PT  Acute Rehabilitation Services Pager [phone] Office [place] 07/02/2017, 4:10 PM

## 2017-07-03 DIAGNOSIS — I10 Essential (primary) hypertension: Secondary | ICD-10-CM

## 2017-07-03 DIAGNOSIS — E1121 Type 2 diabetes mellitus with diabetic nephropathy: Secondary | ICD-10-CM

## 2017-07-03 DIAGNOSIS — N186 End stage renal disease: Secondary | ICD-10-CM

## 2017-07-03 DIAGNOSIS — D649 Anemia, unspecified: Secondary | ICD-10-CM

## 2017-07-03 DIAGNOSIS — Z992 Dependence on renal dialysis: Secondary | ICD-10-CM

## 2017-07-03 LAB — RENAL FUNCTION PANEL
Albumin: 2.8 g/dL — ABNORMAL LOW (ref 3.5–5.0)
Anion gap: 12 (ref 5–15)
BUN: 39 mg/dL — ABNORMAL HIGH (ref 6–20)
CHLORIDE: 99 mmol/L — AB (ref 101–111)
CO2: 27 mmol/L (ref 22–32)
CREATININE: 5.19 mg/dL — AB (ref 0.61–1.24)
Calcium: 8.8 mg/dL — ABNORMAL LOW (ref 8.9–10.3)
GFR calc Af Amer: 14 mL/min — ABNORMAL LOW (ref >60)
GFR, EST NON AFRICAN AMERICAN: 12 mL/min — AB (ref >60)
Glucose, Bld: 94 mg/dL (ref 65–99)
PHOSPHORUS: 3.5 mg/dL (ref 2.5–4.6)
POTASSIUM: 3.9 mmol/L (ref 3.5–5.1)
Sodium: 138 mmol/L (ref 135–145)

## 2017-07-03 LAB — GLUCOSE, CAPILLARY
GLUCOSE-CAPILLARY: 112 mg/dL — AB (ref 65–99)
Glucose-Capillary: 122 mg/dL — ABNORMAL HIGH (ref 65–99)
Glucose-Capillary: 144 mg/dL — ABNORMAL HIGH (ref 65–99)
Glucose-Capillary: 187 mg/dL — ABNORMAL HIGH (ref 65–99)

## 2017-07-03 MED ORDER — CALCITRIOL 0.5 MCG PO CAPS: 0.5000 ug | ORAL_CAPSULE | ORAL | Status: DC

## 2017-07-03 MED ORDER — CHLORHEXIDINE GLUCONATE CLOTH 2 % EX PADS: 6.0000 | MEDICATED_PAD | Freq: Every day | CUTANEOUS | Status: DC

## 2017-07-03 MED ORDER — LIDOCAINE-PRILOCAINE 2.5-2.5 % EX CREA: 1.0000 "application " | TOPICAL_CREAM | CUTANEOUS | Status: DC | PRN

## 2017-07-03 MED ORDER — RENA-VITE PO TABS
1.0000 | ORAL_TABLET | Freq: Every day | ORAL | Status: DC
  Administered 2017-07-03: 1 via ORAL
  Filled 2017-07-03: qty 1

## 2017-07-03 NOTE — Clinical Social Work Note (Addendum)
Jeffery Young is new dialysis patient and [facility] application completed, signed by patient and faxed to [place] office. CSW received call from [facility] staff person Loma Sousa (1:49 pm) in response to patient's [facility] application being faxed to [facility] office and VM's left. Per Loma Sousa, she will have [name]' [facility] application in their system later today and CSW can call the reservation line Thursday morning to set-up his dialysis transportation. Visited with Jeffery Young (2:19 pm) and provided him with update on his [facility] transportation and large yellow envelope with his application and HD information sheet inside.  CSW will continue to follow patient's progress and contact [facility] reservation line on Thursday to schedule his outpatient HD transportation effective Friday, 6/1.  Jeffery Young, MSW, LCSW Licensed Clinical Social Worker [place] [phone]

## 2017-07-03 NOTE — Progress Notes (Signed)
Physical Therapy Treatment Patient Details Name: Jeffery Young MRN: [id number] DOB: 06-25-1965 Today's Date: 07/03/2017    History of Present Illness Pt. is a 52 y.o. M with significant PMH of DM, HTN, HLD, and CAD, recently hospitalized for a stroke. Now presenting with progressive renal failure and started on HD; recent L arm AVG placement.     PT Comments    Patient is progressing very well towards their physical therapy goals. Continues to require increased encouragement to participate. Patient initially did not want to don L AFO for gait training but agreeable once explained purpose and significant. Ambulated 60 feet using RW and min assist for balance. Displays improved foot clearance with AFO but needs continued gait training/cueing for decreased left knee hyperextension during stance phase. Rest of session focused on therapeutic exercises for strengthening, specifically left hip abductors/adductors.     Follow Up Recommendations  SNF     Equipment Recommendations  Rolling walker with 5" wheels    Recommendations for Other Services       Precautions / Restrictions Precautions Precautions: Fall Restrictions Weight Bearing Restrictions: No    Mobility  Bed Mobility Overal bed mobility: Needs Assistance Bed Mobility: Supine to Sit     Supine to sit: Supervision     General bed mobility comments: Supervision for safety.   Transfers Overall transfer level: Needs assistance Equipment used: Rolling walker (2 wheeled) Transfers: Sit to/from Stand Sit to Stand: Min guard            Ambulation/Gait Ambulation/Gait assistance: Min assist Ambulation Distance (Feet): 60 Feet Assistive device: Rolling walker (2 wheeled) Gait Pattern/deviations: Decreased stance time - left;Steppage;Decreased dorsiflexion - left Gait velocity: decreased   General Gait Details: Patient with increased foot clearance using AFO on L foot, however, does have left knee hyperextension in  stance phase.     Stairs             Wheelchair Mobility    Modified Rankin (Stroke Patients Only)       Balance     Sitting balance-Leahy Scale: Good       Standing balance-Leahy Scale: Poor                              Cognition Arousal/Alertness: Awake/alert Behavior During Therapy: Flat affect                                   General Comments: Still needing encouragement to participate      Exercises General Exercises - Lower Extremity Heel Slides: Left;10 reps Other Exercises Other Exercises: Supine: mini bridges x 10 Other Exercises: Supine towel squeezes in hooklying x 20 Other Exercises: LLE clamshells in right sidelying x 15    General Comments        Pertinent Vitals/Pain Pain Assessment: Faces Faces Pain Scale: Hurts little more Pain Location: Lower back with walking Pain Descriptors / Indicators: Constant Pain Intervention(s): Limited activity within patient's tolerance;Monitored during session;Repositioned((placed pillow underneath knees))    Home Living                      Prior Function            PT Goals (current goals can now be found in the care plan section) Acute Rehab PT Goals Patient Stated Goal: walk better PT Goal Formulation: With patient Time For Goal  Achievement: 07/13/17 Potential to Achieve Goals: Fair Progress towards PT goals: Progressing toward goals    Frequency    Min 2X/week      PT Plan Current plan remains appropriate    Co-evaluation              AM-PAC PT "6 Clicks" Daily Activity  Outcome Measure  Difficulty turning over in bed (including adjusting bedclothes, sheets and blankets)?: None Difficulty moving from lying on back to sitting on the side of the bed? : None Difficulty sitting down on and standing up from a chair with arms (e.g., wheelchair, bedside commode, etc,.)?: A Little Help needed moving to and from a bed to chair (including a  wheelchair)?: A Little Help needed walking in hospital room?: A Little Help needed climbing 3-5 steps with a railing? : A Lot 6 Click Score: 19    End of Session Equipment Utilized During Treatment: Gait belt(L AFO) Activity Tolerance: Patient tolerated treatment well Patient left: in bed;with call bell/phone within reach Nurse Communication: Mobility status PT Visit Diagnosis: Other abnormalities of gait and mobility (R26.89);Other symptoms and signs involving the nervous system (R29.898)     Time: 2820-6015 PT Time Calculation (min) (ACUTE ONLY): 21 min  Charges:  $Therapeutic Exercise: 8-22 mins                    G Codes:     Ellamae Sia, PT, DPT Acute Rehabilitation Services  Pager: [phone]    Willy Eddy 07/03/2017, 4:26 PM

## 2017-07-03 NOTE — Progress Notes (Signed)
[facility] PROGRESS NOTE  Jeffery Young XLK:[id number] DOB: 06/28/1965 DOA: 06/25/2017  PCP: [name], MD  Brief History/Interval Summary: 52 y.o.malewith medical history significant ofDM; HTN; HLD; and CAD, CKD, history of TIAs, presented with generalized weakness, terms of uremia, worsening creatinine. He was having ongoing fatigue and he saw nephrology; they suggested he come in to start HD.   Reason for Visit: End-stage renal disease  Consultants: Nephrology  Procedures:  Hemodialysis Hemodialysis catheter placement Left upper arm AV graft  Antibiotics: None  Subjective/Interval History: Patient feels well.  Denies any complaints.  No nausea vomiting.  Tolerating his dialysis.  ROS: Denies any shortness of breath.  Objective:  Vital Signs  Vitals:   07/02/17 1602 07/02/17 2114 07/03/17 0503 07/03/17 0827  BP: (!) 143/80 129/67 (!) 150/72 (!) 143/73  Pulse: 77 79 77 76  Resp: (!) 22 18 18  (!) 21  Temp: 98.1 F (36.7 C) 98.6 F (37 C) 98.5 F (36.9 C) 98.9 F (37.2 C)  TempSrc: Oral Oral Oral Oral  SpO2: 98% 96% 96% 98%  Weight:      Height:        Intake/Output Summary (Last 24 hours) at 07/03/2017 1253 Last data filed at 07/03/2017 3664 Gross per 24 hour  Intake 60 ml  Output 500 ml  Net -440 ml   Filed Weights   07/01/17 2057 07/02/17 0650 07/02/17 1000  Weight: 58.1 kg (128 lb 1.7 oz) 56.4 kg (124 lb 5.4 oz) 55.4 kg (122 lb 2.2 oz)    General appearance: alert, cooperative, appears stated age and no distress Head: Normocephalic, without obvious abnormality, atraumatic Resp: clear to auscultation bilaterally Cardio: regular rate and rhythm, S1, S2 normal, no murmur, click, rub or gallop GI: soft, non-tender; bowel sounds normal; no masses,  no organomegaly Extremities: extremities normal, atraumatic, no cyanosis or edema Neurologic: No obvious focal neurological deficits.  Lab Results:  Data Reviewed: I have personally reviewed  following labs and imaging studies  CBC: Recent Labs  Lab 06/28/17 0651 06/29/17 0747  WBC 7.6 10.5  HGB 10.8* 9.1*  HCT 33.8* 29.4*  MCV 93.6 95.1  PLT 278 403    Basic Metabolic Panel: Recent Labs  Lab 06/29/17 0510 06/30/17 0553 07/01/17 0349 07/02/17 0535 07/03/17 0500  NA 133* 137 136 136 138  K 4.9 4.3 3.8 4.1 3.9  CL 95* 98* 97* 99* 99*  CO2 26 28 28 26 27   GLUCOSE 474* 173* 258* 162* 94  BUN 67* 43* 48* 54* 39*  CREATININE 6.43* 5.51* 6.05* 6.53* 5.19*  CALCIUM 8.1* 8.5* 8.4* 8.9 8.8*  PHOS 6.2* 5.9* 5.6* 5.0* 3.5    GFR: Estimated Creatinine Clearance: 13.2 mL/min (A) (by C-G formula based on SCr of 5.19 mg/dL (H)).  Liver Function Tests: Recent Labs  Lab 06/29/17 0510 06/30/17 0553 07/01/17 0349 07/02/17 0535 07/03/17 0500  ALBUMIN 2.6* 2.8* 2.8* 3.0* 2.8*    Coagulation Profile: Recent Labs  Lab 06/28/17 0651  INR 1.14    CBG: Recent Labs  Lab 07/02/17 1313 07/02/17 1721 07/02/17 2113 07/03/17 0748 07/03/17 1219  GLUCAP 247* 236* 161* 122* 112*     Recent Results (from the past 240 hour(s))  Surgical pcr screen     Status: None   Collection Time: 06/28/17  7:00 AM  Result Value Ref Range Status   MRSA, PCR NEGATIVE NEGATIVE Final   Staphylococcus aureus NEGATIVE NEGATIVE Final    Comment: (NOTE) The Xpert SA Assay (FDA approved for NASAL specimens in  patients 71 years of age and older), is one component of a comprehensive surveillance program. It is not intended to diagnose infection nor to guide or monitor treatment. Performed at [facility], [address] [place], [place] [address]       Radiology Studies: No results found.   Medications:  Scheduled: . amLODipine  10 mg Oral QHS  . aspirin  81 mg Oral Daily  . calcitRIOL  0.25 mcg Oral Daily  . carvedilol  25 mg Oral BID WC  . Chlorhexidine Gluconate Cloth  6 each Topical Q0600  . clopidogrel  75 mg Oral Daily  . docusate sodium  100 mg Oral BID  .  enoxaparin (LOVENOX) injection  30 mg Subcutaneous Q24H  . hydrALAZINE  100 mg Oral TID  . insulin aspart  0-5 Units Subcutaneous QHS  . insulin aspart  0-9 Units Subcutaneous TID WC  . insulin aspart  3 Units Subcutaneous TID WC  . insulin glargine  7 Units Subcutaneous QHS  . isosorbide mononitrate  120 mg Oral Daily  . sertraline  50 mg Oral Daily  . sevelamer carbonate  800 mg Oral TID WC  . simvastatin  20 mg Oral Daily  . sodium chloride flush  3 mL Intravenous Q12H  . 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  Patient was dialyzed after his catheter placement.  Underwent AV graft placement on 5/24. Last dialyzed on 5/28.  Will need to be established at the dialysis center.  Nephrology is following.  Patient will need to go to skilled nursing facility for rehab and inability to care for himself.  Diabetes mellitus type 2 with diabetic nephropathy Uncontrolled with hyperglycemia.   Continue insulin.  Monitor CBGs.  HbA1c 6.9 in April.  Essential hypertension Stable.  Continue home medications.  Hyperlipidemia Continue statin.  Chronic combined systolic and diastolic CHF Echocardiogram from January showed preserved EF with grade 1 diastolic dysfunction.  Volume being managed with dialysis.  History of depression Continue Zoloft.  Normocytic anemia Likely anemia of chronic kidney disease.  Hemoglobin is stable.  History of stroke Stable.  Continue with aspirin Plavix.  DVT Prophylaxis: Lovenox    Code Status: Full code Family Communication: Discussed with the  patient Disposition Plan: Management as outlined above.  Await nephrology to clear for discharge.    LOS: 8 days   [facility] Pager [phone] 07/03/2017, 12:53 PM  If 7PM-7AM, please contact night-coverage at [email or web address], password [facility]

## 2017-07-03 NOTE — Progress Notes (Signed)
Subjective: Interval History: has complaints congested, weak.  Objective: Vital signs in last 24 hours: Temp:  [98.1 F (36.7 C)-98.9 F (37.2 C)] 98.9 F (37.2 C) (05/29 0827) Pulse Rate:  [76-79] 76 (05/29 0827) Resp:  [18-22] 21 (05/29 0827) BP: (129-150)/(67-80) 143/73 (05/29 0827) SpO2:  [96 %-98 %] 98 % (05/29 0827) Weight change: -2.709 kg (-5 lb 15.5 oz)  Intake/Output from previous day: 05/28 0701 - 05/29 0700 In: 60 [P.O.:60] Out: 1200 [Urine:200] Intake/Output this shift: Total I/O In: -  Out: 300 [Urine:300]  General appearance: alert, cooperative, no distress and walker, thin Resp: rales bibasilar and rhonchi bibasilar Chest wall: RIJ PC Cardio: S1, S2 normal and systolic murmur: holosystolic 2/6, blowing at apex GI: soft, liver down 5 cm Extremities: edema 2+ and AVG LUA  Lab Results: No results for input(s): WBC, HGB, HCT, PLT in the last 72 hours. BMET:  Recent Labs    07/02/17 0535 07/03/17 0500  NA 136 138  K 4.1 3.9  CL 99* 99*  CO2 26 27  GLUCOSE 162* 94  BUN 54* 39*  CREATININE 6.53* 5.19*  CALCIUM 8.9 8.8*   No results for input(s): PTH in the last 72 hours. Iron Studies: No results for input(s): IRON, TIBC, TRANSFERRIN, FERRITIN in the last 72 hours.  Studies/Results: No results found.  I have reviewed the patient's current medications.  Assessment/Plan: 1 ESRD new .  Full HD tomorrow. Vol xs.  2 Anemia Fe/esa 3 HPTH vit D 4 DM controlled 5 Debill 6 CVAs 7 HTN lower vol, lower meds P HD, esa, lower vol,  Add vit,     LOS: 8 days   Jeffery Young 07/03/2017,2:15 PM

## 2017-07-03 NOTE — Progress Notes (Signed)
Accepted at [place] .1st treatment Friday May 31,2019 at 11:20am .Schedule and chairtime Monday,Wednesday Friday at 12:20 pm 2nd shift

## 2017-07-04 ENCOUNTER — Inpatient Hospital Stay (HOSPITAL_COMMUNITY): Payer: BC Managed Care – PPO

## 2017-07-04 LAB — RENAL FUNCTION PANEL
ALBUMIN: 2.6 g/dL — AB (ref 3.5–5.0)
ANION GAP: 10 (ref 5–15)
BUN: 52 mg/dL — ABNORMAL HIGH (ref 6–20)
CO2: 25 mmol/L (ref 22–32)
CREATININE: 6.16 mg/dL — AB (ref 0.61–1.24)
Calcium: 8.8 mg/dL — ABNORMAL LOW (ref 8.9–10.3)
Chloride: 100 mmol/L — ABNORMAL LOW (ref 101–111)
GFR, EST AFRICAN AMERICAN: 11 mL/min — AB (ref >60)
GFR, EST NON AFRICAN AMERICAN: 9 mL/min — AB (ref >60)
Glucose, Bld: 200 mg/dL — ABNORMAL HIGH (ref 65–99)
PHOSPHORUS: 4.5 mg/dL (ref 2.5–4.6)
Potassium: 4 mmol/L (ref 3.5–5.1)
SODIUM: 135 mmol/L (ref 135–145)

## 2017-07-04 LAB — GLUCOSE, CAPILLARY
Glucose-Capillary: 209 mg/dL — ABNORMAL HIGH (ref 65–99)
Glucose-Capillary: 230 mg/dL — ABNORMAL HIGH (ref 65–99)
Glucose-Capillary: 188 mg/dL — ABNORMAL HIGH (ref 65–99)

## 2017-07-04 MED ORDER — RENA-VITE PO TABS: 1.0000 | ORAL_TABLET | Freq: Every day | ORAL | 0 refills | Status: DC

## 2017-07-04 MED ORDER — DOCUSATE SODIUM 100 MG PO CAPS: 100.0000 mg | ORAL_CAPSULE | Freq: Two times a day (BID) | ORAL | 0 refills | Status: DC

## 2017-07-04 MED ORDER — CALCITRIOL 0.5 MCG PO CAPS: 0.5000 ug | ORAL_CAPSULE | ORAL | Status: DC

## 2017-07-04 MED ORDER — INSULIN GLARGINE 100 UNIT/ML ~~LOC~~ SOLN: 7.0000 [IU] | Freq: Every day | SUBCUTANEOUS | 11 refills | Status: DC

## 2017-07-04 MED ORDER — NEPRO/CARBSTEADY PO LIQD: 237.0000 mL | Freq: Three times a day (TID) | ORAL | 0 refills | Status: DC | PRN

## 2017-07-04 MED ORDER — CARVEDILOL 12.5 MG PO TABS: 12.5000 mg | ORAL_TABLET | Freq: Two times a day (BID) | ORAL | Status: DC

## 2017-07-04 MED ORDER — SEVELAMER CARBONATE 800 MG PO TABS: 800.0000 mg | ORAL_TABLET | Freq: Three times a day (TID) | ORAL | Status: DC

## 2017-07-04 NOTE — Clinical Social Work Note (Signed)
[facility] reservation line contacted to set-up patient's dialysis transportation for Friday, 6/1. Jeffery Young will be picked up at [facility] between 10:01 - 10:35 am. He will be picked up from the dialysis center between 4:30 - 5 pm and transported back to SNF. Facility staff will have to schedule transportation for Monday. Juliann Pulse, hospital liaison for [facility] contacted and advised of patient's readiness for discharge and that HD transportation arranged for Friday.  Maydelin Deming Givens, MSW, LCSW Licensed Clinical Social Worker [place] [phone]

## 2017-07-04 NOTE — Progress Notes (Signed)
Occupational Therapy Treatment Patient Details Name: Jeffery Young MRN: [id number] DOB: 11/21/1965 Today's Date: 07/04/2017    History of present illness Pt. is a 52 y.o. M with significant PMH of DM, HTN, HLD, and CAD, recently hospitalized for a stroke. Now presenting with progressive renal failure and started on HD; recent L arm AVG placement.    OT comments  Pt requiring max encouragement and min hand held assist for OOB to chair. Groomed seated in chair with set up with decreased thoroughness and performed UB dressing with min assist. Pt with increased confusion. Continues to be appropriate for SNF level therapy.  Follow Up Recommendations  SNF;Supervision/Assistance - 24 hour    Equipment Recommendations       Recommendations for Other Services      Precautions / Restrictions Precautions Precautions: Fall       Mobility Bed Mobility Overal bed mobility: Needs Assistance Bed Mobility: Supine to Sit     Supine to sit: Supervision     General bed mobility comments: Supervision for safety.   Transfers Overall transfer level: Needs assistance Equipment used: 1 person hand held assist Transfers: Sit to/from Stand Sit to Stand: Min assist         General transfer comment: to steady    Balance     Sitting balance-Leahy Scale: Good       Standing balance-Leahy Scale: Poor Standing balance comment: Relies on assistance or UE support for balance.                            ADL either performed or assessed with clinical judgement   ADL Overall ADL's : Needs assistance/impaired Eating/Feeding: Set up;Sitting   Grooming: Wash/dry hands;Wash/dry face;Sitting;Set up Grooming Details (indicate cue type and reason): cues for thoroughness         Upper Body Dressing : Minimal assistance;Sitting Upper Body Dressing Details (indicate cue type and reason): front opening gown                 Functional mobility during ADLs: Minimal  assistance General ADL Comments: hand held assist to ambulate to chair     Vision       Perception     Praxis      Cognition Arousal/Alertness: Awake/alert Behavior During Therapy: Flat affect Overall Cognitive Status: Impaired/Different from baseline Area of Impairment: Orientation;Memory;Safety/judgement                 Orientation Level: Disoriented to;Place;Time;Situation   Memory: Decreased short-term memory   Safety/Judgement: Decreased awareness of safety;Decreased awareness of deficits Awareness: Intellectual   General Comments: pt with increased confusion, cursing, requiring max encouragement for OOB        Exercises     Shoulder Instructions       General Comments      Pertinent Vitals/ Pain       Pain Assessment: Faces Faces Pain Scale: No hurt  Home Living                                          Prior Functioning/Environment              Frequency  Min 2X/week        Progress Toward Goals  OT Goals(current goals can now be found in the care plan section)  Progress towards OT goals: Not progressing toward  goals - comment(pt with increased confusion)  Acute Rehab OT Goals Patient Stated Goal: find out why he is here OT Goal Formulation: With patient Time For Goal Achievement: 07/14/17 Potential to Achieve Goals: Good  Plan Discharge plan remains appropriate    Co-evaluation                 AM-PAC PT "6 Clicks" Daily Activity     Outcome Measure   Help from another person eating meals?: None Help from another person taking care of personal grooming?: A Little Help from another person toileting, which includes using toliet, bedpan, or urinal?: A Little Help from another person bathing (including washing, rinsing, drying)?: A Little Help from another person to put on and taking off regular upper body clothing?: A Little Help from another person to put on and taking off regular lower body clothing?:  A Little 6 Click Score: 19    End of Session Equipment Utilized During Treatment: Gait belt  OT Visit Diagnosis: Other abnormalities of gait and mobility (R26.89);Muscle weakness (generalized) (M62.81);Other symptoms and signs involving cognitive function   Activity Tolerance Patient tolerated treatment well   Patient Left in chair;with call bell/phone within reach;with chair alarm set   Nurse Communication          Time: 3435-6861 OT Time Calculation (min): 24 min  Charges: OT General Charges $OT Visit: 1 Visit OT Treatments $Self Care/Home Management : 23-37 mins  07/04/2017 Nestor Lewandowsky, OTR/L Pager: [phone]   Werner Lean, [place] 07/04/2017, 12:15 PM

## 2017-07-04 NOTE — Progress Notes (Signed)
[name]  Background: 52 yo Hispanic man, PMH HTN, HLD, DM2, strokes (CT 05/2017 old lacunes), CAD prior PTCA. Was here 4/23-06/09/17 for weakness, neurovegetative sx. Discharged to SNF d/t inability to care for self or live alone, residing at [facility]. Initial renal eval done 05/2017 for creatinine of 5.8. All serologies were negative. 4.3 gm proteinuria. Had been taking NSAIDS so hope was for some possible recovery. Seen by Dr. Carolin Sicks at our office 06/21/17 for F/U of advanced CKD. Creatinine was up to 7.54, BUN 95, K 5.4.Sent to ED by [facility]. TDC/L upper AVG 5.24 Dr. Scot Dock. HD initiated 5.24.19 via [place].  Assessment/Recommendations  1. 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 Had twin with ESRD.  Currently TTS 1. TDC andLeft upperAVG done 5/24 Dr. Scot Dock 2. HD#1 5/24, has had 3 treatments 3. CLIPPED to [facility] MWF 2nd shift and can start there tomorrow 4. EDW 55.5 kkg to start 2. Anemia  1. Aranesp 100 dosed 5/25.  2. TSat 21 Had 4 doses of Ferric gluconate  3. 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 H/O stroke with residual deficits-neg CT for acute last adm, just old lacunes. ASA, plavix (resumed), statin 5. Disposition - for discharge to SNF. Can start at [place] 2nd shift. Cancelled ordered treatment for today  Jamal Maes, MD [place] Pager 07/04/2017, 8:43 AM   Subjective: No new events Was in HD to get TMT - cancelled as learned of CLIP  Objective: Vital signs in last 24 hours: Temp:  [98.3 F (36.8 C)-98.5 F (36.9 C)] 98.5 F (36.9 C) (05/30 0514) Pulse Rate:  [72-75] 75 (05/30 0514) Resp:  [16-20] 16 (05/30 0514) BP: (137-154)/(67-78) 154/78 (05/30 0514) SpO2:  [95 %-97 %] 97 % (05/30 0514) Weight:  [57 kg (125 lb 10.6 oz)] 57 kg (125 lb 10.6 oz) (05/30 0514) Weight change: 1.6 kg (3 lb 8.4 oz)  Intake/Output from previous day: 05/29 0701 - 05/30 0700 In: 1002 [P.O.:1002] Out: 1225 [Urine:1225] Intake/Output  this shift: No intake/output data recorded. Examination Small framed Hispanic man NAD VS as noted No JVD Lungs clear no crackles or wheezes S1S2 No S3 Soft systolic murmur LSB Abd soft and not tender No edema of LE's R IJ TDC and L upper AVG (5/24) with great bruit, clean incisions  BMET:  Recent Labs    07/02/17 0535 07/03/17 0500  NA 136 138  K 4.1 3.9  CL 99* 99*  CO2 26 27  GLUCOSE 162* 94  BUN 54* 39*  CREATININE 6.53* 5.19*  CALCIUM 8.9 8.8*   Iron Studies: No results for input(s): IRON, TIBC, TRANSFERRIN, FERRITIN in the last 72 hours. Studies/Results: No results found.  Scheduled: . amLODipine  10 mg Oral QHS  . aspirin  81 mg Oral Daily  . calcitRIOL  0.5 mcg Oral QODAY  . carvedilol  25 mg Oral BID WC  . Chlorhexidine Gluconate Cloth  6 each Topical Q1200  . clopidogrel  75 mg Oral Daily  . docusate sodium  100 mg Oral BID  . enoxaparin (LOVENOX) injection  30 mg Subcutaneous Q24H  . hydrALAZINE  100 mg Oral TID  . insulin aspart  0-5 Units Subcutaneous QHS  . insulin aspart  0-9 Units Subcutaneous TID WC  . insulin aspart  3 Units Subcutaneous TID WC  . insulin glargine  7 Units Subcutaneous QHS  . isosorbide mononitrate  120 mg Oral Daily  . multivitamin  1 tablet Oral QHS  . sertraline  50 mg Oral Daily  . sevelamer carbonate  800 mg Oral TID WC  . simvastatin  20 mg Oral Daily  . sodium chloride flush  3 mL Intravenous Q12H  . traZODone  50 mg Oral QHS

## 2017-07-04 NOTE — Discharge Instructions (Signed)
[facility]  Discharge Instructions  AV Fistula or Graft Surgery for Dialysis Access  Please refer to the following instructions for your post-procedure care. Your surgeon or physician assistant will discuss any changes with you.  Activity  You may drive the day following your surgery, if you are comfortable and no longer taking prescription pain medication. Resume full activity as the soreness in your incision resolves.  Bathing/Showering  You may shower after you go home. Keep your incision dry for 48 hours. Do not soak in a bathtub, hot tub, or swim until the incision heals completely. You may not shower if you have a hemodialysis catheter.  Incision Care  Clean your incision with mild soap and water after 48 hours. Pat the area dry with a clean towel. You do not need a bandage unless otherwise instructed. Do not apply any ointments or creams to your incision. You may have skin glue on your incision. Do not peel it off. It will come off on its own in about one week. Your arm may swell a bit after surgery. To reduce swelling use pillows to elevate your arm so it is above your heart. Your doctor will tell you if you need to lightly wrap your arm with an ACE bandage.  Diet  Resume your normal diet. There are not special food restrictions following this procedure. In order to heal from your surgery, it is CRITICAL to get adequate nutrition. Your body requires vitamins, minerals, and protein. Vegetables are the best source of vitamins and minerals. Vegetables also provide the perfect balance of protein. Processed food has little nutritional value, so try to avoid this.  Medications  Resume taking all of your medications. If your incision is causing pain, you may take over-the counter pain relievers such as acetaminophen (Tylenol). If you were prescribed a stronger pain medication, please be aware these medications can cause nausea and constipation. Prevent  nausea by taking the medication with a snack or meal. Avoid constipation by drinking plenty of fluids and eating foods with high amount of fiber, such as fruits, vegetables, and grains.  Do not take Tylenol if you are taking prescription pain medications.  Follow up Your surgeon may want to see you in the office following your access surgery. If so, this will be arranged at the time of your surgery.  Please call us immediately for any of the following conditions:  Increased pain, redness, drainage (pus) from your incision site Fever of 101 degrees or higher Severe or worsening pain at your incision site Hand pain or numbness.  Reduce your risk of vascular disease:  Stop smoking. If you would like help, call [facility] at 1-800-QUIT-NOW 289 727 9579) or [place] at [place] your cholesterol Maintain a desired weight Control your diabetes Keep your blood pressure down  Dialysis  It will take several weeks to several months for your new dialysis access to be ready for use. Your surgeon will determine when it is okay to use it. Your nephrologist will continue to direct your dialysis. You can continue to use your Permcath until your new access is ready for use.   06/28/2017 Jeffery Young [id number] 06/14/1965  Surgeon(s): Angelia Mould, MD  Procedure(s): LEFT UPPER ARM ARTERIOVENOUS GORTEX-GRAFT PLACEMENT INSERTION OF DIALYSIS CATHETER RIGHT INTERNAL JUGULAR  x Do not stick graft for 4 weeks    If you have any questions, please call the office at [phone].   End-Stage Kidney Disease End-stage kidney disease occurs when  the kidneys are so damaged that they cannot do their job. The kidneys are two organs that do many important jobs in the body, which include:  Removing wastes and extra fluids from the blood.  Making hormones that maintain the amount of fluid in your tissues and blood vessels.  Maintaining the right amount of fluids and chemicals  in the body.  When the kidneys are damaged and cannot do their job, life-threatening problems occur. Without the help of the kidneys, toxins build up in the blood. In end-stage kidney disease, the kidneys cannot get better. What are the causes? End-stage kidney disease usually occurs when a long-lasting (chronic) kidney disease gets worse. It may also occur after the kidneys are suddenly damaged (acute kidney injury). What increases the risk? This condition is more likely to develop in people who are:  Older than age 11.  Male.  Of African-American descent.  Current smokers or former smokers.  Obese.  You may also have an increased risk for end-stage kidney disease if you:  Have a family history of chronic kidney disease (CKD).  Have had kidney disease for many years.  Have other longstanding medical conditions that affect the kidneys, such as: ? Cardiovascular disease including high blood pressure. ? Diabetes. ? Certain diseases that affect the immune system.  What are the signs or symptoms?  Swelling (edema) of the face, legs, ankles, or feet.  Numbness, tingling, or loss of feeling (sensation) in your hands or feet.  Tiredness (lethargy).  Nausea or vomiting.  Confusion, trouble concentrating, or loss of consciousness.  Chest pain.  Shortness of breath.  Little to no urine production.  Muscle twitches and cramps, especially in the legs.  Constant itchiness.  Loss of appetite.  Pale skin and tissue lining your eyelids (conjunctiva).  Headaches.  Abnormally dark or light skin.  Decrease in muscle size (muscle wasting).  Easy bruising.  Frequent hiccups.  Stopping of menstruation in women.  Seizures. How is this diagnosed? Your health care provider will measure your blood pressure and do some tests. These may include:  Urine tests.  Blood tests.  Imaging tests.  A test in which a sample of tissue is removed from the kidneys to be looked at  under a microscope (kidney biopsy).  How is this treated? There are two treatments for end-stage kidney disease:  A procedure that removes toxic wastes from the body (dialysis). Depending on the type of dialysis you choose, it may be performed more than one time a day (peritoneal dialysis) or several times a week (hemodialysis).  Surgery toreceive a new kidney (kidney transplant).  In addition to having dialysis or a kidney transplant, you may need to take medicines:  To control high blood pressure (hypertension).  To control cholesterol.  To maintain healthy electrolyte levels in your blood.  You may also be given a specific diet to follow that includes requirements or limits for:  Salt (sodium).  Protein.  Phosphorous.  Potassium.  Calcium.  Follow these instructions at home:  Follow your prescribed diet.  Take over-the-counter and prescription medicines only as told by your health care provider. ? Do not take any new medicines unless approved by your health care provider. Many medicines can worsen your kidney damage. ? Do not take any vitamin and mineral supplements unless approved by your health care provider. Many nutritional supplements can worsen your kidney damage. ? The dose of some medicines that you take may need to be adjusted.  Do not use any tobacco  products, such as cigarettes, chewing tobacco, and e-cigarettes. If you need help quitting, ask your health care provider.  Keep all follow-up visits as told by your health care provider. This is important.  Keep track of your blood pressure. Report changes in your blood pressure as told by your health care provider.  Achieve and maintain a healthy weight. If you need help with this, ask your health care provider.  Start or continue an exercise plan. Try to exercise at least 30 minutes a day, 5 days a week.  Stay current with immunizations as told by your health care provider. Where to find more  information:  American Association of Kidney Patients: BombTimer.gl  National Kidney Foundation: www.kidney.[place]: https://mathis.com/  [facility]: www.lifeoptions.org and www.kidneyschool.org Contact a health care provider if:  Your symptoms get worse.  You develop new symptoms. Get help right away if:  You have weakness in an arm or leg on one side of your body.  You have difficulty speaking or you are slurring your speech.  You have a sudden change in your vision.  You have a sudden, severe headache.  You have a sudden weight increase.  You have difficulty breathing.  Your symptoms suddenly get worse. This information is not intended to replace advice given to you by your health care provider. Make sure you discuss any questions you have with your health care provider. Document Released: 04/14/2003 Document Revised: 06/30/2015 Document Reviewed: 09/21/2011 Elsevier Interactive Patient Education  2017 [employer].

## 2017-07-04 NOTE — Progress Notes (Signed)
Report called to Nurse Roselyn Reef at [facility]. Dorthey Sawyer, RN

## 2017-07-04 NOTE — Progress Notes (Signed)
Spoke with Dr Maryland Pink about pt's confusion this am, and asked if he had read the night RNs note from this am about his confusion. Pt also currently not able to remember where he is and why he is here. Asking " how long have I been here", does not remember having to have dialysis, does not know date--current month or year. Able to state his DOB.   Pt's wife had called and spoke with pt then called nurse to  state that pt was not sounding right, stating that he had to go pick up his kids from karate school (his kids are grown). Wife concerned and wanted to get information on pt and speak with doctor.    Dr Maryland Pink notified of the above and a CT scan of head was ordered for pt and he will contact wife after the CT scan is done.

## 2017-07-04 NOTE — Discharge Summary (Signed)
[facility]  Physician Discharge Summary   Patient ID: Jeffery Young MRN: [id number] DOB/AGE: 06-30-1965 52 y.o.  Admit date: 06/25/2017 Discharge date: 07/04/2017  PCP: [name], MD  DISCHARGE DIAGNOSES:  End-stage renal disease  RECOMMENDATIONS FOR OUTPATIENT FOLLOW UP: 1. Patient to go for outpatient dialysis starting Friday, May 31.  He will be on a Monday Wednesday Friday schedule. 2. Check CBGs before meals and at bedtime.   DISCHARGE CONDITION: fair  Diet recommendation: Modified carbohydrate  Filed Weights   07/02/17 0650 07/02/17 1000 07/04/17 0514  Weight: 56.4 kg (124 lb 5.4 oz) 55.4 kg (122 lb 2.2 oz) 57 kg (125 lb 10.6 oz)    INITIAL HISTORY: 52 y.o.malewith medical history significant ofDM; HTN; HLD; and CAD, CKD, history of TIAs, presented with generalized weakness, terms of uremia, worsening creatinine. He was having ongoing fatigue and he saw nephrology; they suggested he come in to start HD.   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  Patient was dialyzed after his catheter placement.  Underwent AV graft placement on 5/24. Last dialyzed on 5/28.    Patient has been established at an outpatient dialysis center.  His first session of dialysis will be on May 31. Patient will need to go to skilled nursing facility for rehab and inability to care for himself.  Diabetes mellitus type 2 with diabetic nephropathy Uncontrolled with hyperglycemia.   Continue insulin.  Monitor CBGs.  HbA1c 6.9 in April.  Essential hypertension Stable.  Continue home medications.  Hyperlipidemia Continue statin.  Chronic combined systolic and diastolic CHF Echocardiogram from January showed preserved EF with grade 1 diastolic dysfunction.  Volume being managed with  dialysis.  History of depression Continue Zoloft.  Normocytic anemia Likely anemia of chronic kidney disease.  Hemoglobin is stable.  History of stroke Stable.  Continue with aspirin Plavix.  Patient had an episode of transient confusion this morning.  When he woke up he seemed to be agitated.  He was disoriented.  Did not have any focal deficits at that time.  CT scan of the head was done which shows only chronic atrophy and old infarcts.  Patient reexamined.  He does not know why the confusion occurred this morning.  He now knows where he is.  Denies any headaches.  No focal neurological deficits are appreciated.  Could have experienced sundowning.  Patient was reassured.  Also discussed with his spouse and reassured.  Patient is medically stable for discharge to skilled nursing facility.    PERTINENT LABS:  The results of significant diagnostics from this hospitalization (including imaging, microbiology, ancillary and laboratory) are listed below for reference.    Microbiology: Recent Results (from the past 240 hour(s))  Surgical pcr screen     Status: None   Collection Time: 06/28/17  7:00 AM  Result Value Ref Range Status   MRSA, PCR NEGATIVE NEGATIVE Final   Staphylococcus aureus NEGATIVE NEGATIVE Final    Comment: (NOTE) The Xpert SA Assay (FDA approved for NASAL specimens in patients 26 years of age and older), is one component of a comprehensive surveillance program. It is not intended to diagnose infection nor to guide or monitor treatment. Performed at [facility], [place] [address], [place], [place] [address]      Labs: Basic Metabolic Panel: Recent Labs  Lab 06/29/17 0510 06/30/17 [phone]  07/01/17 0349 07/02/17 0535 07/03/17 0500  NA 133* 137 136 136 138  K 4.9 4.3 3.8 4.1 3.9  CL 95* 98* 97* 99* 99*  CO2 26 28 28 26 27   GLUCOSE 361* 173* 258* 162* 94  BUN 67* 43* 48* 54* 39*  CREATININE 6.43* 5.51* 6.05* 6.53* 5.19*  CALCIUM 8.1* 8.5* 8.4* 8.9  8.8*  PHOS 6.2* 5.9* 5.6* 5.0* 3.5   Liver Function Tests: Recent Labs  Lab 06/29/17 0510 06/30/17 0553 07/01/17 0349 07/02/17 0535 07/03/17 0500  ALBUMIN 2.6* 2.8* 2.8* 3.0* 2.8*   CBC: Recent Labs  Lab 06/28/17 0651 06/29/17 0747  WBC 7.6 10.5  HGB 10.8* 9.1*  HCT 33.8* 29.4*  MCV 93.6 95.1  PLT 278 210   CBG: Recent Labs  Lab 07/03/17 1219 07/03/17 1703 07/03/17 2215 07/04/17 0738 07/04/17 1155  GLUCAP 112* 144* 187* 209* 230*     IMAGING STUDIES Ct Head Wo Contrast  Result Date: 07/04/2017 CLINICAL DATA:  Altered mental status EXAM: CT HEAD WITHOUT CONTRAST TECHNIQUE: Contiguous axial images were obtained from the base of the skull through the vertex without intravenous contrast. COMPARISON:  05/28/2017 FINDINGS: Brain: Lacunar infarct within the right half of the pons is better visualized on the current exam. Lacunar infarcts within the basal ganglia particularly on the right are noted. Mild atrophic changes and chronic white matter ischemic change is seen. No findings to suggest acute hemorrhage, acute infarction or space-occupying mass lesion are noted. Vascular: No hyperdense vessel or unexpected calcification. Skull: Normal. Negative for fracture or focal lesion. Sinuses/Orbits: No acute finding. Other: None. IMPRESSION: Chronic atrophic and ischemic changes without acute abnormality. Electronically Signed   By: Inez Catalina M.D.   On: 07/04/2017 12:50   Dg Chest Port 1 View  Result Date: 06/28/2017 CLINICAL DATA:  52 year old male with a history of dialysis catheter placement EXAM: PORTABLE CHEST 1 VIEW COMPARISON:  05/28/2017 FINDINGS: Cardiomediastinal silhouette unchanged in size and contour. Coronary stents in place. Interval placement of right IJ approach hemodialysis catheter with the tip appearing to terminate superior vena cava. No confluent airspace disease. No pneumothorax or pleural effusion. No acute displaced fracture. IMPRESSION: Interval placement  of right IJ approach hemodialysis catheter, with no complicating features. Electronically Signed   By: Corrie Mckusick D.O.   On: 06/28/2017 17:12    DISCHARGE EXAMINATION: Vitals:   07/03/17 1707 07/03/17 1944 07/04/17 0514 07/04/17 1022  BP: (!) 143/67 137/71 (!) 154/78 (!) 150/71  Pulse: 72 75 75 75  Resp: 20 18 16 18   Temp: 98.3 F (36.8 C)  98.5 F (36.9 C) 97.9 F (36.6 C)  TempSrc: Oral  Oral Oral  SpO2: 95% 97% 97% 95%  Weight:   57 kg (125 lb 10.6 oz)   Height:       General appearance: alert, cooperative, appears stated age and no distress Head: Normocephalic, without obvious abnormality, atraumatic Resp: clear to auscultation bilaterally Cardio: regular rate and rhythm, S1, S2 normal, no murmur, click, rub or gallop GI: soft, non-tender; bowel sounds normal; no masses,  no organomegaly Needed drift.  Strength equal bilateral upper and lower extremities.  Oriented to person place.  DISPOSITION: Skilled Nursing facility  Discharge Instructions    Call MD for:  extreme fatigue   Complete by:  As directed    Call MD for:  persistant dizziness or light-headedness   Complete by:  As directed    Call MD for:  persistant nausea and vomiting   Complete by:  As  directed    Call MD for:  severe uncontrolled pain   Complete by:  As directed    Call MD for:  temperature >100.4   Complete by:  As directed    Discharge instructions   Complete by:  As directed    Please review discharge summary for instructions.  You were cared for by a hospitalist during your hospital stay. If you have any questions about your discharge medications or the care you received while you were in the hospital after you are discharged, you can call the unit and asked to speak with the hospitalist on call if the hospitalist that took care of you is not available. Once you are discharged, your primary care physician will handle any further medical issues. Please note that NO REFILLS for any discharge  medications will be authorized once you are discharged, as it is imperative that you return to your primary care physician (or establish a relationship with a primary care physician if you do not have one) for your aftercare needs so that they can reassess your need for medications and monitor your lab values. If you do not have a primary care physician, you can call [phone] for a physician referral.   Increase activity slowly   Complete by:  As directed         Allergies as of 07/04/2017   No Known Allergies     Medication List    STOP taking these medications   amLODipine 10 MG tablet Commonly known as:  NORVASC   chlorproMAZINE 25 MG tablet Commonly known as:  THORAZINE   furosemide 80 MG tablet Commonly known as:  LASIX   hydrALAZINE 100 MG tablet Commonly known as:  APRESOLINE   isosorbide mononitrate 120 MG 24 hr tablet Commonly known as:  IMDUR   MULTIVITAMIN ADULT PO   sodium bicarbonate 650 MG tablet     TAKE these medications   aspirin 81 MG chewable tablet Chew 1 tablet (81 mg total) by mouth daily.   atorvastatin 10 MG tablet Commonly known as:  LIPITOR Take 10 mg by mouth daily.   calcitRIOL 0.5 MCG capsule Commonly known as:  ROCALTROL Take 1 capsule (0.5 mcg total) by mouth every Monday, Wednesday, and Friday with hemodialysis. Start taking on:  07/05/2017 What changed:    medication strength  how much to take  when to take this   carvedilol 12.5 MG tablet Commonly known as:  COREG Take 1 tablet (12.5 mg total) by mouth 2 (two) times daily with a meal. What changed:    medication strength  how much to take   clopidogrel 75 MG tablet Commonly known as:  PLAVIX TAKE ONE TABLET BY MOUTH DAILY   docusate sodium 100 MG capsule Commonly known as:  COLACE Take 1 capsule (100 mg total) by mouth 2 (two) times daily.   feeding supplement (NEPRO CARB STEADY) Liqd Take 237 mLs by mouth 3 (three) times daily as needed (Supplement).   insulin  aspart 100 UNIT/ML FlexPen Commonly known as:  NOVOLOG Inject 0-10 Units into the skin 3 (three) times daily before meals. Sliding Scale: 0-150: 0u; 151-200: 2u; 201-250: 4u; 251-300: 6u; 301-350: 8u; 351-400:10u. 401+ Give 10u and call MD   insulin glargine 100 UNIT/ML injection Commonly known as:  LANTUS Inject 0.07 mLs (7 Units total) into the skin at bedtime.   multivitamin Tabs tablet Take 1 tablet by mouth at bedtime.   nitroGLYCERIN 0.4 MG SL tablet Commonly known as:  NITROSTAT Place  1 tablet (0.4 mg total) under the tongue every 5 (five) minutes x 3 doses as needed for chest pain.   sertraline 50 MG tablet Commonly known as:  ZOLOFT Take 1 tablet (50 mg total) by mouth daily.   sevelamer carbonate 800 MG tablet Commonly known as:  RENVELA Take 1 tablet (800 mg total) by mouth 3 (three) times daily with meals.   traZODone 50 MG tablet Commonly known as:  DESYREL Take 1 tablet (50 mg total) by mouth at bedtime.        Follow-up Information    [facility].   Specialty:  Vascular Surgery Why:  As needed Contact information: [address] [place]: 35 mins  [place] [facility] Pager [phone]  07/04/2017, 1:25 PM

## 2017-07-04 NOTE — Progress Notes (Signed)
Patient awoke at approximately 0615.  He was attempting to leave room in order to go play basketball.  Attempted to redirect patient.  Upon reassessment, patient cannot remember the month or year.  He thinks he needs to go pick his three children up at middle school.  He does not remember what hemodialysis is and why he is at the hospital.  Again reoriented patient to his surroundings.  He is cooperative.  Transported her to take patient to HD.  Will continue to monitor patient.  Earleen Reaper RN-BC, [employer]

## 2017-07-04 NOTE — Clinical Social Work Note (Signed)
Patient discharging back to [facility] today, transported by ambulance. Facility advised of today's discharge and received authorization from patient's insurance on 5/28. Patient's wife contacted and advised of d/c and ambulance transport.  CSW signing off as no other SW intervention services needed due to patient's discharge.  Jeffery Young, MSW, LCSW Licensed Clinical Social Worker [place] [phone]

## 2017-07-15 NOTE — Addendum Note (Signed)
Addendum  created 07/15/17 0929 by Roberts Gaudy, MD   [place] recorded in [place], [place] filed

## 2017-07-30 ENCOUNTER — Ambulatory Visit (INDEPENDENT_AMBULATORY_CARE_PROVIDER_SITE_OTHER): Payer: Self-pay | Admitting: Physician Assistant

## 2017-07-30 VITALS — BP 132/63 | HR 69 | Temp 98.1°F | Ht 65.0 in | Wt 120.0 lb

## 2017-07-30 DIAGNOSIS — N186 End stage renal disease: Secondary | ICD-10-CM

## 2017-07-30 DIAGNOSIS — Z992 Dependence on renal dialysis: Secondary | ICD-10-CM

## 2017-07-30 NOTE — Progress Notes (Signed)
    Postoperative Access Visit   History of Present Illness   Jeffery Young is a 52 y.o. year old male who presents for postoperative follow-up for: left upper arm arteriovenous graft and R IJ TDC placement by Dr. Scot Dock (Date: 06/28/17).  The patient's wounds are well healed.  The patient denies steal symptoms.  He does have L hand numbness however states this is unchanged compared to before surgery.  The patient is able to complete their activities of daily living.  He is dialyzing on MWF schedule without complication via R IJ TDC under the management of Dr. Carolynne Edouard.   Physical Examination   Vitals:   07/30/17 1325  BP: 132/63  Pulse: 69  Temp: 98.1 F (36.7 C)  TempSrc: Oral  SpO2: 94%  Weight: 120 lb (54.4 kg)  Height: 5\' 5"  (1.651 m)   Body mass index is 19.97 kg/m.  left arm Incision is healed, hand grip is 5/5, sensation in digits is intact, palpable thrill, bruit can be auscultated; L radial by doppler     Medical Decision Making   Jeffery Young is a 52 y.o. year old male who presents s/p left upper arm arteriovenous graft   The patient's access is ready for use.  The patient's tunneled dialysis catheter can be removed when Dr. Carolynne Edouard is comfortable with the performance of L arm AV graft  Patient may follow up on a prn basis   Dagoberto Ligas PA-C [facility] [place] Office: [phone]

## 2017-08-15 ENCOUNTER — Encounter

## 2017-08-15 ENCOUNTER — Ambulatory Visit: Payer: BC Managed Care – PPO | Admitting: Family Medicine

## 2017-08-15 ENCOUNTER — Encounter: Payer: Self-pay | Admitting: Family Medicine

## 2017-08-15 ENCOUNTER — Other Ambulatory Visit: Payer: Self-pay

## 2017-08-15 VITALS — BP 110/50 | HR 79 | Temp 98.8°F | Ht 64.0 in | Wt 113.0 lb

## 2017-08-15 DIAGNOSIS — I509 Heart failure, unspecified: Secondary | ICD-10-CM

## 2017-08-15 DIAGNOSIS — I130 Hypertensive heart and chronic kidney disease with heart failure and stage 1 through stage 4 chronic kidney disease, or unspecified chronic kidney disease: Secondary | ICD-10-CM | POA: Diagnosis not present

## 2017-08-15 DIAGNOSIS — E1122 Type 2 diabetes mellitus with diabetic chronic kidney disease: Secondary | ICD-10-CM | POA: Diagnosis not present

## 2017-08-15 DIAGNOSIS — F329 Major depressive disorder, single episode, unspecified: Secondary | ICD-10-CM | POA: Diagnosis not present

## 2017-08-15 DIAGNOSIS — N186 End stage renal disease: Secondary | ICD-10-CM | POA: Diagnosis not present

## 2017-08-15 DIAGNOSIS — I2510 Atherosclerotic heart disease of native coronary artery without angina pectoris: Secondary | ICD-10-CM

## 2017-08-15 DIAGNOSIS — Z794 Long term (current) use of insulin: Secondary | ICD-10-CM

## 2017-08-15 DIAGNOSIS — Z992 Dependence on renal dialysis: Secondary | ICD-10-CM | POA: Diagnosis not present

## 2017-08-15 DIAGNOSIS — I10 Essential (primary) hypertension: Secondary | ICD-10-CM

## 2017-08-15 DIAGNOSIS — F32A Depression, unspecified: Secondary | ICD-10-CM

## 2017-08-15 MED ORDER — SEVELAMER CARBONATE 800 MG PO TABS: 800.0000 mg | ORAL_TABLET | Freq: Three times a day (TID) | ORAL | 1 refills | Status: AC

## 2017-08-15 MED ORDER — CALCITRIOL 0.5 MCG PO CAPS: 0.5000 ug | ORAL_CAPSULE | ORAL | 1 refills | Status: DC

## 2017-08-15 MED ORDER — ASPIRIN 81 MG PO CHEW: 81.0000 mg | CHEWABLE_TABLET | Freq: Every day | ORAL | 1 refills | Status: DC

## 2017-08-15 MED ORDER — TRAZODONE HCL 50 MG PO TABS: 50.0000 mg | ORAL_TABLET | Freq: Every day | ORAL | 1 refills | Status: DC

## 2017-08-15 MED ORDER — CARVEDILOL 12.5 MG PO TABS: 12.5000 mg | ORAL_TABLET | Freq: Two times a day (BID) | ORAL | 1 refills | Status: DC

## 2017-08-15 MED ORDER — SERTRALINE HCL 50 MG PO TABS: 50.0000 mg | ORAL_TABLET | Freq: Every day | ORAL | 1 refills | Status: DC

## 2017-08-15 MED ORDER — DOCUSATE SODIUM 100 MG PO CAPS: 100.0000 mg | ORAL_CAPSULE | Freq: Two times a day (BID) | ORAL | 1 refills | Status: DC

## 2017-08-15 MED ORDER — RENA-VITE PO TABS: 1.0000 | ORAL_TABLET | Freq: Every day | ORAL | 1 refills | Status: DC

## 2017-08-15 MED ORDER — ATORVASTATIN CALCIUM 10 MG PO TABS: 10.0000 mg | ORAL_TABLET | Freq: Every day | ORAL | 1 refills | Status: DC

## 2017-08-15 NOTE — Progress Notes (Signed)
Subjective:  By signing my name below, I, [name], attest that this documentation has been prepared under the direction and in the presence of [name], MD Electronically Signed: Ladene Artist, ED Scribe 08/15/2017 at 10:20 AM.   Patient ID: Jeffery Young, male    DOB: [birth year], 52 y.o.   MRN: [id number]  Chief Complaint  Patient presents with  . Establish Care    review of history & Med refills    HPI Jeffery Young is a 52 y.o. male who presents to Primary Care at [facility] to establish care. New pt to me. H/o coronary artery disease with NSTEMI, end stage renal disease, DM on insulin, CHF, HTN, hyperlipidemia. - Pt presents with wife and daughter today. Prev PCP [name], MD. Wife states she is looking for another PCP since his prev PCP will not refill his meds and has not approved PT. Pt is unable to walk now due to weakness and foot drop. He presents in a wheelchair today.  ESRD Takes calcitriol, rena-vit. On dialysis MWF. Was admitted 5/21-30/19. AVF graph placed 5/24. Discharged to a skilled nursing facility for rehab. Nephrologist: Dr. Carolynne Edouard. - Pt had dialysis yesterday, due tomorrow. He states that dialysis has been going ok. He has been staying wife his wife whom he is currently separated from since discharge from nursing facility on 6/11. Pt has noticed some bladder incontinence, especially when he has dialysis, but notes he doesn't seem to urinate much on those days.   CAD with CHF NSTEMI on 12/2015 with stents placed in circumflex. Echo 02/2016 EF 55-60%. Dyspnea with brilinta, changed to plavix. Cardiologist Dr. Angelena Form, [place] 06/2016. Continued on Imdur for cp, plavix, statin, betablocker. HTN was elevated, hydralazine was increased to 50 mg bid. Pt was seen 11/2016 by cardiology with BP 196/100. Some confusion with doses and med frequency. Was only taking coreg qd, hydralazine bid, unsure of imdur. Compliance was discussed. Recommended 1-2 wk f/u. Admitted  4/23-06/09/17 with hypertensive urgency. Discharged on Norvasc, coreg, hydralazine, bisacodyl. Denies new cp, sob, blood in stools, melena, lightheadedness, dizziness. Lab Results  Component Value Date   CHOL 191 02/14/2016   HDL 52 02/14/2016   LDLCALC 102 (H) 02/14/2016   TRIG 184 (H) 02/14/2016   CHOLHDL 3.7 02/14/2016  On lipitor 10 mg qd.  HTN Most recent discharge summary, should be on coreg 12.5 mg bid, norvasc, lasix, hydralazine, imdur were stopped.  DM Lab Results  Component Value Date   HGBA1C 6.9 (H) 32/35/5732  DM complicated with neuropathy and hyperglycemia. Discharge regimen of lantus 7 units qhs with novolog sliding scale with meals. - Pt's children check his blood glucose 3 times/day. They are checking his blood glucose before meals and 2 hours after meals. He does not currently have an endocrinologist.  Rx Refills Renvela 800 mg tid, aspirin 81 mg qd, colace 100 mg bid, trazodone 50 mg qhs, zoloft 50 mg qd, calcitriol 0.5 mcg MWF by hemodialysis, rena-vit qhs, coreg 12.5 mg bid, lipitor 10 mg qd.  Patient Active Problem List   Diagnosis Date Noted  . ESRD (end stage renal disease) on dialysis ([place]) 06/25/2017  . Uncontrolled hypertension 05/28/2017  . Depression 05/28/2017  . Normocytic anemia 05/28/2017  . History of completed stroke 05/28/2017  . Syncope 03/13/2016  . Ischemic cardiomyopathy 03/13/2016  . Unstable angina pectoris ([place]) 01/11/2016  . Hypertensive heart disease with heart failure ([place])   . Chronic combined systolic and diastolic heart failure ([place])   . Elevated troponin   .  Chest pain 01/10/2016  . Coronary artery disease due to lipid rich plaque 01/10/2016  . Status post coronary artery stent placement   . Type 2 diabetes mellitus with diabetic nephropathy, without long-term current use of insulin ([place])   . Tobacco abuse   . Essential hypertension   . Hypercholesteremia   . NSTEMI (non-ST elevated myocardial infarction) ([place]) 12/22/2015  .  Lightheadedness 08/22/2012  . Dehydration 08/22/2012   Past Medical History:  Diagnosis Date  . Coronary artery disease    a.12/22/15: NSTEMI s/p overlapping DES x2 and balloon angioplasty to distal AV groove Circumflex (too small for a stent).   . Depression   . ESRD (end stage renal disease) on dialysis ([place])   . Hypercholesteremia   . Hypertension   . Prostate infection   . Tobacco abuse   . Type II diabetes mellitus ([place])    Past Surgical History:  Procedure Laterality Date  . BASCILIC VEIN TRANSPOSITION Left 06/28/2017   Procedure: LEFT UPPER ARM ARTERIOVENOUS GORTEX-GRAFT PLACEMENT;  Surgeon: Angelia Mould, MD;  Location: [facility];  Service: Vascular;  Laterality: Left;  . CARDIAC CATHETERIZATION N/A 12/22/2015   Procedure: Left Heart Cath and Coronary Angiography;  Surgeon: Burnell Blanks, MD;  Location: [facility];  Service: Cardiovascular;  Laterality: N/A;  . CARDIAC CATHETERIZATION N/A 12/22/2015   Procedure: Coronary Stent Intervention;  Surgeon: Burnell Blanks, MD;  Location: [place];  Service: Cardiovascular;  Laterality: N/A;  . CARDIAC CATHETERIZATION N/A 01/11/2016   Procedure: Left Heart Cath and Coronary Angiography;  Surgeon: Sherren Mocha, MD;  Location: [place] [facility];  Service: Cardiovascular;  Laterality: N/A;  . CARDIAC CATHETERIZATION N/A 01/11/2016   Procedure: Intravascular Pressure Wire/FFR Study;  Surgeon: Sherren Mocha, MD;  Location: [place];  Service: Cardiovascular;  Laterality: N/A;  . CARDIAC CATHETERIZATION N/A 01/11/2016   Procedure: Coronary Stent Intervention;  Surgeon: Sherren Mocha, MD;  Location: [place];  Service: Cardiovascular;  Laterality: N/A;  . CORONARY ANGIOPLASTY WITH STENT PLACEMENT  12/22/2015  . INSERTION OF DIALYSIS CATHETER Right 06/28/2017   Procedure: INSERTION OF DIALYSIS CATHETER RIGHT INTERNAL JUGULAR;  Surgeon: Angelia Mould, MD;  Location: [facility];   Service: Vascular;  Laterality: Right;   No Known Allergies Prior to Admission medications   Medication Sig Start Date End Date Taking? Authorizing Provider  aspirin 81 MG chewable tablet Chew 1 tablet (81 mg total) by mouth daily. 01/13/16   Lyda Jester M, PA-C  atorvastatin (LIPITOR) 10 MG tablet Take 10 mg by mouth daily.    [provider]  calcitRIOL (ROCALTROL) 0.5 MCG capsule Take 1 capsule (0.5 mcg total) by mouth every Monday, Wednesday, and Friday with hemodialysis. 07/05/17   Bonnielee Haff, MD  carvedilol (COREG) 12.5 MG tablet Take 1 tablet (12.5 mg total) by mouth 2 (two) times daily with a meal. 07/04/17   Bonnielee Haff, MD  clopidogrel (PLAVIX) 75 MG tablet TAKE ONE TABLET BY MOUTH DAILY 04/10/17   Burnell Blanks, MD  docusate sodium (COLACE) 100 MG capsule Take 1 capsule (100 mg total) by mouth 2 (two) times daily. 07/04/17   Bonnielee Haff, MD  insulin aspart (NOVOLOG) 100 UNIT/ML FlexPen Inject 0-10 Units into the skin 3 (three) times daily before meals. Sliding Scale: 0-150: 0u; 151-200: 2u; 201-250: 4u; 251-300: 6u; 301-350: 8u; 351-400:10u. 401+ Give 10u and call MD    [provider]  insulin glargine (LANTUS) 100 UNIT/ML injection Inject 0.07 mLs (  7 Units total) into the skin at bedtime. 07/04/17   Bonnielee Haff, MD  multivitamin (RENA-VIT) TABS tablet Take 1 tablet by mouth at bedtime. 07/04/17   Bonnielee Haff, MD  nitroGLYCERIN (NITROSTAT) 0.4 MG SL tablet Place 1 tablet (0.4 mg total) under the tongue every 5 (five) minutes x 3 doses as needed for chest pain. 12/26/15   Cheryln Manly, NP  Nutritional Supplements (FEEDING SUPPLEMENT, NEPRO CARB STEADY,) LIQD Take 237 mLs by mouth 3 (three) times daily as needed (Supplement). 07/04/17   Bonnielee Haff, MD  sertraline (ZOLOFT) 50 MG tablet Take 1 tablet (50 mg total) by mouth daily. 06/08/17   Patrecia Pour, MD  sevelamer carbonate (RENVELA) 800 MG tablet Take 1 tablet (800 mg total) by  mouth 3 (three) times daily with meals. 07/04/17   Bonnielee Haff, MD  traZODone (DESYREL) 50 MG tablet Take 1 tablet (50 mg total) by mouth at bedtime. 06/09/17   Patrecia Pour, MD   Social History   Socioeconomic History  . Marital status: Married    Spouse name: Not on file  . Number of children: Not on file  . Years of education: Not on file  . Highest education level: Not on file  Occupational History  . Occupation: International aid/development worker: [employer]  Social Needs  . Financial resource strain: Not on file  . Food insecurity:    Worry: Not on file    Inability: Not on file  . Transportation needs:    Medical: Not on file    Non-medical: Not on file  Tobacco Use  . Smoking status: Former Smoker    Packs/day: 0.75    Years: 29.00    Pack years: 21.75    Types: Cigarettes    Last attempt to quit: 05/28/2017    Years since quitting: 0.2  . Smokeless tobacco: Never Used  Substance and Sexual Activity  . Alcohol use: Not Currently    Alcohol/week: 4.2 oz    Types: 7 Cans of beer per week    Frequency: Never    Comment: last drink was prior to last hospitalization  . Drug use: No  . Sexual activity: Not Currently  Lifestyle  . Physical activity:    Days per week: Not on file    Minutes per session: Not on file  . Stress: Not on file  Relationships  . Social connections:    Talks on phone: Not on file    Gets together: Not on file    Attends religious service: Not on file    Active member of club or organization: Not on file    Attends meetings of clubs or organizations: Not on file    Relationship status: Not on file  . Intimate partner violence:    Fear of current or ex partner: Not on file    Emotionally abused: Not on file    Physically abused: Not on file    Forced sexual activity: Not on file  Other Topics Concern  . Not on file  Social History Narrative  . Not on file   Review of Systems  Constitutional: Negative for fatigue and unexpected weight  change.  Eyes: Negative for visual disturbance.  Respiratory: Negative for cough, chest tightness and shortness of breath.   Cardiovascular: Negative for chest pain, palpitations and leg swelling.  Gastrointestinal: Positive for constipation. Negative for abdominal pain and blood in stool.  Neurological: Negative for dizziness, light-headedness and headaches.  Objective:   Physical Exam  Constitutional: He is oriented to person, place, and time. He appears well-developed and well-nourished.  HENT:  Head: Normocephalic and atraumatic.  Eyes: Pupils are equal, round, and reactive to light. EOM are normal.  Neck: No JVD present. Carotid bruit is not present.  Cardiovascular: Normal rate, regular rhythm and normal heart sounds.  No murmur heard. Pulmonary/Chest: Effort normal and breath sounds normal. He has no rales.  Musculoskeletal: He exhibits no edema.  Neurological: He is alert and oriented to person, place, and time.  Skin: Skin is warm and dry.  Psychiatric: He has a normal mood and affect.  Vitals reviewed.  Vitals:   08/15/17 0957 08/15/17 1010  BP: (!) 96/56 (!) 110/50  Pulse: 79   Temp: 98.8 F (37.1 C)   TempSrc: Oral   SpO2: 92%   Weight: 113 lb (51.3 kg)   Height: 5\' 4"  (1.626 m)   over 45 min spent in face-to-face counseling and review of chart.    Assessment & Plan:    Mare Ludtke is a 52 y.o. male ESRD on dialysis ([place]) - Plan: sevelamer carbonate (RENVELA) 800 MG tablet, calcitRIOL (ROCALTROL) 0.5 MCG capsule, multivitamin (RENA-VIT) TABS tablet  -Compliant with dialysis, will try to obtain labs from most recent nephrology eval and as well as nephrology notes if possible to review at next visit.  Meds were refilled as above, but likely can be filled by his nephrologist in the future  Type 2 diabetes mellitus with chronic kidney disease on chronic dialysis, with long-term current use of insulin ([place])  -On sliding scale insulin as well as long-acting.   Advised to keep a record of amount of sliding scale use as well as home readings, return with those records in the next few weeks to determine adjustments in basal level and possible need for endocrinology eval.  Congestive heart failure, unspecified HF chronicity, unspecified heart failure type ([place]) - Plan: aspirin 81 MG chewable tablet, carvedilol (COREG) 12.5 MG tablet, atorvastatin (LIPITOR) 10 MG tablet  -Appears euvolemic.  Overdue for cardiology follow-up, stressed importance of meeting with cardiologist.  No change in Coreg or statin dose at this time.  Continue aspirin.  Depression, unspecified depression type - Plan: sertraline (ZOLOFT) 50 MG tablet, traZODone (DESYREL) 50 MG tablet  -Tolerating current doses of Zoloft and trazodone, refilled, can discuss further at next office visit if needed.  Essential hypertension  -Borderline low, but improved on recheck.  Can be followed with nephrology as well as scheduled for dialysis 3 times per week.  Coronary artery disease involving native heart without angina pectoris, unspecified vessel or lesion type - Plan: carvedilol (COREG) 12.5 MG tablet, atorvastatin (LIPITOR) 10 MG tablet Asymptomatic at present, follow-up with cardiology as above.  Reportedly needs disability paperwork completed.  Asked that he leave that for review, and will try to complete that based on provided information at this visit as well as review of his chart.  However we may need another visit to review those details further.  Additionally his family states that he has been referred to physical therapy in the past, and needs another order.  Asked that they have that order sent to me to review plan.   Meds ordered this encounter  Medications  . sevelamer carbonate (RENVELA) 800 MG tablet    Sig: Take 1 tablet (800 mg total) by mouth 3 (three) times daily with meals.    Dispense:  90 tablet    Refill:  1  .  aspirin 81 MG chewable tablet    Sig: Chew 1 tablet (81 mg  total) by mouth daily.    Dispense:  90 tablet    Refill:  1  . docusate sodium (COLACE) 100 MG capsule    Sig: Take 1 capsule (100 mg total) by mouth 2 (two) times daily.    Dispense:  60 capsule    Refill:  1  . sertraline (ZOLOFT) 50 MG tablet    Sig: Take 1 tablet (50 mg total) by mouth daily.    Dispense:  90 tablet    Refill:  1  . traZODone (DESYREL) 50 MG tablet    Sig: Take 1 tablet (50 mg total) by mouth at bedtime.    Dispense:  90 tablet    Refill:  1  . calcitRIOL (ROCALTROL) 0.5 MCG capsule    Sig: Take 1 capsule (0.5 mcg total) by mouth every Monday, Wednesday, and Friday with hemodialysis.    Dispense:  30 capsule    Refill:  1  . multivitamin (RENA-VIT) TABS tablet    Sig: Take 1 tablet by mouth at bedtime.    Dispense:  90 tablet    Refill:  1  . carvedilol (COREG) 12.5 MG tablet    Sig: Take 1 tablet (12.5 mg total) by mouth 2 (two) times daily with a meal.    Dispense:  180 tablet    Refill:  1  . atorvastatin (LIPITOR) 10 MG tablet    Sig: Take 1 tablet (10 mg total) by mouth daily.    Dispense:  90 tablet    Refill:  1   Patient Instructions   For heart disease, it appears you are due for follow up with Dr. Angelena Form. Please call his office for follow up. You may be due for repeat cholesterol testing and we cna check that next visit.   I will request the notes and recent labs from your nephrologist. We can decide on needed labs for your next visit.   For diabetes, please keep a record of your blood sugars and bring those readings as well as amount of insulin used.  We can decide if there are changes needed, or if endocrinologist appointment if needed. Follow up in 1 weeks and we can check A1c at that time as well.   If order for PT is needed, please have that request sent to me and I will place that order. We can also discuss strength issues next visit.   meds were refilled today. We can discuss some of those further at next visit.    Blood Glucose  Monitoring, Adult Monitoring your blood sugar (glucose) helps you manage your diabetes. It also helps you and your health care provider determine how well your diabetes management plan is working. Blood glucose monitoring involves checking your blood glucose as often as directed, and keeping a record (log) of your results over time. Why should I monitor my blood glucose? Checking your blood glucose regularly can:  Help you understand how food, exercise, illnesses, and medicines affect your blood glucose.  Let you know what your blood glucose is at any time. You can quickly tell if you are having low blood glucose (hypoglycemia) or high blood glucose (hyperglycemia).  Help you and your health care provider adjust your medicines as needed.  When should I check my blood glucose? Follow instructions from your health care provider about how often to check your blood glucose. This may depend on:  The type of diabetes you  have.  How well-controlled your diabetes is.  Medicines you are taking.  If you have type 1 diabetes:  Check your blood glucose at least 2 times a day.  Also check your blood glucose: ? Before every insulin injection. ? Before and after exercise. ? Between meals. ? 2 hours after a meal. ? Occasionally between 2:00 a.m. and 3:00 a.m., as directed. ? Before potentially dangerous tasks, like driving or using heavy machinery. ? At bedtime.  You may need to check your blood glucose more often, up to 6-10 times a day: ? If you use an insulin pump. ? If you need multiple daily injections (MDI). ? If your diabetes is not well-controlled. ? If you are ill. ? If you have a history of severe hypoglycemia. ? If you have a history of not knowing when your blood glucose is getting low (hypoglycemia unawareness). If you have type 2 diabetes:  If you take insulin or other diabetes medicines, check your blood glucose at least 2 times a day.  If you are on intensive insulin  therapy, check your blood glucose at least 4 times a day. Occasionally, you may also need to check between 2:00 a.m. and 3:00 a.m., as directed.  Also check your blood glucose: ? Before and after exercise. ? Before potentially dangerous tasks, like driving or using heavy machinery.  You may need to check your blood glucose more often if: ? Your medicine is being adjusted. ? Your diabetes is not well-controlled. ? You are ill. What is a blood glucose log?  A blood glucose log is a record of your blood glucose readings. It helps you and your health care provider: ? Look for patterns in your blood glucose over time. ? Adjust your diabetes management plan as needed.  Every time you check your blood glucose, write down your result and notes about things that may be affecting your blood glucose, such as your diet and exercise for the day.  Most glucose meters store a record of glucose readings in the meter. Some meters allow you to download your records to a computer. How do I check my blood glucose? Follow these steps to get accurate readings of your blood glucose: Supplies needed   Blood glucose meter.  Test strips for your meter. Each meter has its own strips. You must use the strips that come with your meter.  A needle to prick your finger (lancet). Do not use lancets more than once.  A device that holds the lancet (lancing device).  A journal or log book to write down your results. Procedure  Wash your hands with soap and water.  Prick the side of your finger (not the tip) with the lancet. Use a different finger each time.  Gently rub the finger until a small drop of blood appears.  Follow instructions that come with your meter for inserting the test strip, applying blood to the strip, and using your blood glucose meter.  Write down your result and any notes. Alternative testing sites  Some meters allow you to use areas of your body other than your finger (alternative  sites) to test your blood.  If you think you may have hypoglycemia, or if you have hypoglycemia unawareness, do not use alternative sites. Use your finger instead.  Alternative sites may not be as accurate as the fingers, because blood flow is slower in these areas. This means that the result you get may be delayed, and it may be different from the result that  you would get from your finger.  The most common alternative sites are: ? Forearm. ? Thigh. ? Palm of the hand. Additional tips  Always keep your supplies with you.  If you have questions or need help, all blood glucose meters have a 24-hour "hotline" number that you can call. You may also contact your health care provider.  After you use a few boxes of test strips, adjust (calibrate) your blood glucose meter by following instructions that came with your meter. This information is not intended to replace advice given to you by your health care provider. Make sure you discuss any questions you have with your health care provider. Document Released: 01/25/2003 Document Revised: 08/12/2015 Document Reviewed: 07/04/2015 Elsevier Interactive Patient Education  2017 [employer].        IF you received an x-ray today, you will receive an invoice from [facility]. Please contact [facility] at [phone] with questions or concerns regarding your invoice.   IF you received labwork today, you will receive an invoice from [facility]. Please contact [facility] at [phone] with questions or concerns regarding your invoice.   Our billing staff will not be able to assist you with questions regarding bills from these companies.  You will be contacted with the lab results as soon as they are available. The fastest way to get your results is to activate your My Chart account. Instructions are located on the last page of this paperwork. If you have not heard from [place] regarding the results in 2 weeks, please contact this  office.        I personally performed the services described in this documentation, which was scribed in my presence. The recorded information has been reviewed and considered for accuracy and completeness, addended by me as needed, and agree with information above.  Signed,   Merri Ray, MD [facility] [place].  08/16/17 1:03 PM

## 2017-08-15 NOTE — Patient Instructions (Addendum)
For heart disease, it appears you are due for follow up with Dr. Angelena Form. Please call his office for follow up. You may be due for repeat cholesterol testing and we cna check that next visit.   I will request the notes and recent labs from your nephrologist. We can decide on needed labs for your next visit.   For diabetes, please keep a record of your blood sugars and bring those readings as well as amount of insulin used.  We can decide if there are changes needed, or if endocrinologist appointment if needed. Follow up in 1 weeks and we can check A1c at that time as well.   If order for PT is needed, please have that request sent to me and I will place that order. We can also discuss strength issues next visit.   meds were refilled today. We can discuss some of those further at next visit.    Blood Glucose Monitoring, Adult Monitoring your blood sugar (glucose) helps you manage your diabetes. It also helps you and your health care provider determine how well your diabetes management plan is working. Blood glucose monitoring involves checking your blood glucose as often as directed, and keeping a record (log) of your results over time. Why should I monitor my blood glucose? Checking your blood glucose regularly can:  Help you understand how food, exercise, illnesses, and medicines affect your blood glucose.  Let you know what your blood glucose is at any time. You can quickly tell if you are having low blood glucose (hypoglycemia) or high blood glucose (hyperglycemia).  Help you and your health care provider adjust your medicines as needed.  When should I check my blood glucose? Follow instructions from your health care provider about how often to check your blood glucose. This may depend on:  The type of diabetes you have.  How well-controlled your diabetes is.  Medicines you are taking.  If you have type 1 diabetes:  Check your blood glucose at least 2 times a day.  Also check  your blood glucose: ? Before every insulin injection. ? Before and after exercise. ? Between meals. ? 2 hours after a meal. ? Occasionally between 2:00 a.m. and 3:00 a.m., as directed. ? Before potentially dangerous tasks, like driving or using heavy machinery. ? At bedtime.  You may need to check your blood glucose more often, up to 6-10 times a day: ? If you use an insulin pump. ? If you need multiple daily injections (MDI). ? If your diabetes is not well-controlled. ? If you are ill. ? If you have a history of severe hypoglycemia. ? If you have a history of not knowing when your blood glucose is getting low (hypoglycemia unawareness). If you have type 2 diabetes:  If you take insulin or other diabetes medicines, check your blood glucose at least 2 times a day.  If you are on intensive insulin therapy, check your blood glucose at least 4 times a day. Occasionally, you may also need to check between 2:00 a.m. and 3:00 a.m., as directed.  Also check your blood glucose: ? Before and after exercise. ? Before potentially dangerous tasks, like driving or using heavy machinery.  You may need to check your blood glucose more often if: ? Your medicine is being adjusted. ? Your diabetes is not well-controlled. ? You are ill. What is a blood glucose log?  A blood glucose log is a record of your blood glucose readings. It helps you and your health  care provider: ? Look for patterns in your blood glucose over time. ? Adjust your diabetes management plan as needed.  Every time you check your blood glucose, write down your result and notes about things that may be affecting your blood glucose, such as your diet and exercise for the day.  Most glucose meters store a record of glucose readings in the meter. Some meters allow you to download your records to a computer. How do I check my blood glucose? Follow these steps to get accurate readings of your blood glucose: Supplies  needed   Blood glucose meter.  Test strips for your meter. Each meter has its own strips. You must use the strips that come with your meter.  A needle to prick your finger (lancet). Do not use lancets more than once.  A device that holds the lancet (lancing device).  A journal or log book to write down your results. Procedure  Wash your hands with soap and water.  Prick the side of your finger (not the tip) with the lancet. Use a different finger each time.  Gently rub the finger until a small drop of blood appears.  Follow instructions that come with your meter for inserting the test strip, applying blood to the strip, and using your blood glucose meter.  Write down your result and any notes. Alternative testing sites  Some meters allow you to use areas of your body other than your finger (alternative sites) to test your blood.  If you think you may have hypoglycemia, or if you have hypoglycemia unawareness, do not use alternative sites. Use your finger instead.  Alternative sites may not be as accurate as the fingers, because blood flow is slower in these areas. This means that the result you get may be delayed, and it may be different from the result that you would get from your finger.  The most common alternative sites are: ? Forearm. ? Thigh. ? Palm of the hand. Additional tips  Always keep your supplies with you.  If you have questions or need help, all blood glucose meters have a 24-hour "hotline" number that you can call. You may also contact your health care provider.  After you use a few boxes of test strips, adjust (calibrate) your blood glucose meter by following instructions that came with your meter. This information is not intended to replace advice given to you by your health care provider. Make sure you discuss any questions you have with your health care provider. Document Released: 01/25/2003 Document Revised: 08/12/2015 Document Reviewed:  07/04/2015 Elsevier Interactive Patient Education  2017 [employer].        IF you received an x-ray today, you will receive an invoice from [facility]. Please contact [facility] at [phone] with questions or concerns regarding your invoice.   IF you received labwork today, you will receive an invoice from [facility]. Please contact [facility] at [phone] with questions or concerns regarding your invoice.   Our billing staff will not be able to assist you with questions regarding bills from these companies.  You will be contacted with the lab results as soon as they are available. The fastest way to get your results is to activate your My Chart account. Instructions are located on the last page of this paperwork. If you have not heard from [place] regarding the results in 2 weeks, please contact this office.

## 2017-08-16 ENCOUNTER — Telehealth: Payer: Self-pay | Admitting: Family Medicine

## 2017-08-16 NOTE — Telephone Encounter (Signed)
Copied from [place] [id number]. Topic: Quick Communication - See Telephone Encounter >> Aug 16, 2017 10:25 AM Rutherford Nail, NT wrote: CRM for notification. See Telephone encounter for: 08/16/17. Sheran Lawless, social worker with [facility] calling to request verbal order from Dr Carlota Raspberry to go visit with patient and assist with benefits, services, and discuss ways to help wife care for him. Please advise. CB#: [phone]

## 2017-08-16 NOTE — Telephone Encounter (Signed)
Please advise 

## 2017-08-16 NOTE — Telephone Encounter (Signed)
Patient needs Disability forms to be completed by Dr Carlota Raspberry for his ESRD. I have completed what I could from the [facility] notes and highlighted the areas I was not sure about. I will place the forms in Dr Vonna Kotyk box on Monday 08/19/17 please return them to the FMLA/Disability box at the [address] checkout desk within 5-7 business days thank you!

## 2017-08-20 ENCOUNTER — Telehealth: Payer: Self-pay | Admitting: Family Medicine

## 2017-08-20 NOTE — Telephone Encounter (Signed)
Copied from [place] [id number]. Topic: Quick Communication - See Telephone Encounter >> Aug 20, 2017 11:15 AM Bea Graff, NT wrote: CRM for notification. See Telephone encounter for: 08/20/17. Cindy with [facility] calling to get orders to continue home health for this patient. For social worker: 1 more visit this month. OT: 2 times a week for 4 weeks. PT: 2 times a week for 2 weeks. Nursing 1 time a week for 4 weeks. CB#: [phone]

## 2017-08-20 NOTE — Telephone Encounter (Signed)
Verbal given 

## 2017-08-21 NOTE — Telephone Encounter (Signed)
Appointment tomorrow - can discuss disability ppw and specifics at that time.

## 2017-08-21 NOTE — Telephone Encounter (Signed)
Verbal order given for SW assessment as listed. Left message on Ms. Trimmel's voicemail.

## 2017-08-22 ENCOUNTER — Encounter: Payer: Self-pay | Admitting: Family Medicine

## 2017-08-22 ENCOUNTER — Telehealth: Payer: Self-pay | Admitting: Family Medicine

## 2017-08-22 ENCOUNTER — Other Ambulatory Visit: Payer: Self-pay

## 2017-08-22 ENCOUNTER — Ambulatory Visit: Payer: BC Managed Care – PPO | Admitting: Family Medicine

## 2017-08-22 VITALS — BP 90/50 | HR 71 | Temp 98.6°F | Ht 65.0 in | Wt 113.0 lb

## 2017-08-22 DIAGNOSIS — Z992 Dependence on renal dialysis: Secondary | ICD-10-CM

## 2017-08-22 DIAGNOSIS — I509 Heart failure, unspecified: Secondary | ICD-10-CM

## 2017-08-22 DIAGNOSIS — E1122 Type 2 diabetes mellitus with diabetic chronic kidney disease: Secondary | ICD-10-CM

## 2017-08-22 DIAGNOSIS — N186 End stage renal disease: Secondary | ICD-10-CM | POA: Diagnosis not present

## 2017-08-22 DIAGNOSIS — I959 Hypotension, unspecified: Secondary | ICD-10-CM

## 2017-08-22 DIAGNOSIS — I6930 Unspecified sequelae of cerebral infarction: Secondary | ICD-10-CM

## 2017-08-22 DIAGNOSIS — Z794 Long term (current) use of insulin: Secondary | ICD-10-CM

## 2017-08-22 MED ORDER — CARVEDILOL 6.25 MG PO TABS: 6.2500 mg | ORAL_TABLET | Freq: Two times a day (BID) | ORAL | 1 refills | Status: DC

## 2017-08-22 NOTE — Progress Notes (Signed)
Subjective:    Patient ID: Jeffery Young, male    DOB: 04-May-1965, 52 y.o.   MRN: [id number]  HPI Jeffery Young is a 52 y.o. male Presents today for: Chief Complaint  Patient presents with  . medication review  . possible lab work    with the paper work discussion.   Is here for follow-up.  Saw him on July 11 to establish care.  History of multiple medical problems including prior CAD with NSTEMI, insulin-dependent diabetes, end-stage renal disease on dialysis, heart failure, depression, stroke.  End-stage renal disease followed by nephrologist, on dialysis Monday Wednesdays Fridays.  Was admitted May 21-30.  Had been discharged to a skilled nursing facility for rehab.  I recently placed in order for social work evaluation for home needs, and physical therapy evaluation (initial assessment)  PT will return today.  Prescription for walker, and apparently he needs a new ASO brace with reported foot drop. Had ASO brace, but lost at inpatient rehab.   History of stroke: Continue on aspirin and Plavix.  Did have an episode of confusion in the hospital, CT head with chronic atrophy and old infarcts. Hx of left foot drop, and residual left sided weakness. Has walker at home now.   Diabetes: Lab Results  Component Value Date   HGBA1C 6.9 (H) 05/29/2017  He is on home sliding scale as well as long-acting insulin.  Asked to return with home records to determine dosage changes versus endocrinology eval. : Home readings: 120,200, 11, 277, 162, 232, 211, 219, 130, 207, 193, 252, 122, 153, 128, 207, 186, 99, 121, 321, 137.  Some days no sliding scale, some days needed.   Hypertension: Borderline low last visit, advised to discuss with nephrology. Feels overall weak with trying to sit up, but denies lightheadedness. Currently on Coreg 12.5mg  BID.   BP Readings from Last 3 Encounters:  08/22/17 (!) 90/50  08/15/17 (!) 110/50  07/30/17 132/63   Cardiac: Cardiologist : Angelena Form.  History of  CHF, CAD with NSTEMI.  Discussed follow-up with cardiology.  Was continued on carvedilol, atorvastatin.  Patient Active Problem List   Diagnosis Date Noted  . ESRD (end stage renal disease) on dialysis ([place]) 06/25/2017  . Uncontrolled hypertension 05/28/2017  . Depression 05/28/2017  . Normocytic anemia 05/28/2017  . History of completed stroke 05/28/2017  . Syncope 03/13/2016  . Ischemic cardiomyopathy 03/13/2016  . Unstable angina pectoris ([place]) 01/11/2016  . Hypertensive heart disease with heart failure ([place])   . Chronic combined systolic and diastolic heart failure ([place])   . Elevated troponin   . Chest pain 01/10/2016  . Coronary artery disease due to lipid rich plaque 01/10/2016  . Status post coronary artery stent placement   . Type 2 diabetes mellitus with diabetic nephropathy, without long-term current use of insulin ([place])   . Tobacco abuse   . Essential hypertension   . Hypercholesteremia   . NSTEMI (non-ST elevated myocardial infarction) ([place]) 12/22/2015  . Lightheadedness 08/22/2012  . Dehydration 08/22/2012   Past Medical History:  Diagnosis Date  . Coronary artery disease    a.12/22/15: NSTEMI s/p overlapping DES x2 and balloon angioplasty to distal AV groove Circumflex (too small for a stent).   . Depression   . ESRD (end stage renal disease) on dialysis ([place])   . Hypercholesteremia   . Hypertension   . Prostate infection   . Tobacco abuse   . Type II diabetes mellitus ([place])    Past Surgical History:  Procedure Laterality Date  .  BASCILIC VEIN TRANSPOSITION Left 06/28/2017   Procedure: LEFT UPPER ARM ARTERIOVENOUS GORTEX-GRAFT PLACEMENT;  Surgeon: Angelia Mould, MD;  Location: [facility];  Service: Vascular;  Laterality: Left;  . CARDIAC CATHETERIZATION N/A 12/22/2015   Procedure: Left Heart Cath and Coronary Angiography;  Surgeon: Burnell Blanks, MD;  Location: [facility];  Service: Cardiovascular;  Laterality: N/A;  . CARDIAC  CATHETERIZATION N/A 12/22/2015   Procedure: Coronary Stent Intervention;  Surgeon: Burnell Blanks, MD;  Location: [facility];  Service: Cardiovascular;  Laterality: N/A;  . CARDIAC CATHETERIZATION N/A 01/11/2016   Procedure: Left Heart Cath and Coronary Angiography;  Surgeon: Sherren Mocha, MD;  Location: [facility];  Service: Cardiovascular;  Laterality: N/A;  . CARDIAC CATHETERIZATION N/A 01/11/2016   Procedure: Intravascular Pressure Wire/FFR Study;  Surgeon: Sherren Mocha, MD;  Location: [facility];  Service: Cardiovascular;  Laterality: N/A;  . CARDIAC CATHETERIZATION N/A 01/11/2016   Procedure: Coronary Stent Intervention;  Surgeon: Sherren Mocha, MD;  Location: [facility];  Service: Cardiovascular;  Laterality: N/A;  . CORONARY ANGIOPLASTY WITH STENT PLACEMENT  12/22/2015  . INSERTION OF DIALYSIS CATHETER Right 06/28/2017   Procedure: INSERTION OF DIALYSIS CATHETER RIGHT INTERNAL JUGULAR;  Surgeon: Angelia Mould, MD;  Location: [facility] OR;  Service: Vascular;  Laterality: Right;   No Known Allergies Prior to Admission medications   Medication Sig Start Date End Date Taking? Authorizing Provider  aspirin 81 MG chewable tablet Chew 1 tablet (81 mg total) by mouth daily. 08/15/17  Yes [name], MD  atorvastatin (LIPITOR) 10 MG tablet Take 1 tablet (10 mg total) by mouth daily. 08/15/17  Yes [name], MD  calcitRIOL (ROCALTROL) 0.5 MCG capsule Take 1 capsule (0.5 mcg total) by mouth every Monday, Wednesday, and Friday with hemodialysis. 08/16/17  Yes [name], MD  carvedilol (COREG) 12.5 MG tablet Take 1 tablet (12.5 mg total) by mouth 2 (two) times daily with a meal. 08/15/17  Yes [name], MD  clopidogrel (PLAVIX) 75 MG tablet TAKE ONE TABLET BY MOUTH DAILY 04/10/17  Yes Burnell Blanks, MD  docusate sodium (COLACE) 100 MG capsule Take 1 capsule (100 mg total) by mouth 2 (two) times daily. 08/15/17  Yes [name], MD  insulin aspart (NOVOLOG) 100 UNIT/ML FlexPen Inject 0-10 Units into the skin 3 (three) times daily before meals. Sliding Scale: 0-150: 0u; 151-200: 2u; 201-250: 4u; 251-300: 6u; 301-350: 8u; 351-400:10u. 401+ Give 10u and call MD   Yes [provider]  insulin glargine (LANTUS) 100 UNIT/ML injection Inject 0.07 mLs (7 Units total) into the skin at bedtime. 07/04/17  Yes Bonnielee Haff, MD  multivitamin (RENA-VIT) TABS tablet Take 1 tablet by mouth at bedtime. 08/15/17  Yes [name], MD  nitroGLYCERIN (NITROSTAT) 0.4 MG SL tablet Place 1 tablet (0.4 mg total) under the tongue every 5 (five) minutes x 3 doses as needed for chest pain. 12/26/15  Yes Cheryln Manly, NP  Nutritional Supplements (FEEDING SUPPLEMENT, NEPRO CARB STEADY,) LIQD Take 237 mLs by mouth 3 (three) times daily as needed (Supplement). 07/04/17  Yes Bonnielee Haff, MD  sertraline (ZOLOFT) 50 MG tablet Take 1 tablet (50 mg total) by mouth daily. 08/15/17  Yes [name], MD  sevelamer carbonate (RENVELA) 800 MG tablet Take 1 tablet (800 mg total) by mouth 3 (three) times daily with meals. 08/15/17  Yes [name], MD  traZODone (DESYREL) 50 MG tablet Take  1 tablet (50 mg total) by mouth at bedtime. 08/15/17  Yes [name], MD   Social History   Socioeconomic History  . Marital status: Married    Spouse name: Not on file  . Number of children: Not on file  . Years of education: Not on file  . Highest education level: Not on file  Occupational History  . Occupation: International aid/development worker: [employer]  Social Needs  . Financial resource strain: Not on file  . Food insecurity:    Worry: Not on file    Inability: Not on file  . Transportation needs:    Medical: Not on file    Non-medical: Not on file  Tobacco Use  . Smoking status: Former Smoker    Packs/day: 0.75    Years: 29.00    Pack years: 21.75    Types: Cigarettes    Last attempt to quit: 05/28/2017     Years since quitting: 0.2  . Smokeless tobacco: Never Used  Substance and Sexual Activity  . Alcohol use: Not Currently    Alcohol/week: 4.2 oz    Types: 7 Cans of beer per week    Frequency: Never    Comment: last drink was prior to last hospitalization  . Drug use: No  . Sexual activity: Not Currently  Lifestyle  . Physical activity:    Days per week: Not on file    Minutes per session: Not on file  . Stress: Not on file  Relationships  . Social connections:    Talks on phone: Not on file    Gets together: Not on file    Attends religious service: Not on file    Active member of club or organization: Not on file    Attends meetings of clubs or organizations: Not on file    Relationship status: Not on file  . Intimate partner violence:    Fear of current or ex partner: Not on file    Emotionally abused: Not on file    Physically abused: Not on file    Forced sexual activity: Not on file  Other Topics Concern  . Not on file  Social History Narrative  . Not on file    Review of Systems     Objective:   Physical Exam  Constitutional: He appears well-developed and well-nourished.  HENT:  Head: Normocephalic and atraumatic.  Eyes: Pupils are equal, round, and reactive to light. EOM are normal.  Neck: No JVD present. Carotid bruit is not present.  Cardiovascular: Normal rate, regular rhythm and normal heart sounds.  No murmur heard. Pulmonary/Chest: Effort normal and breath sounds normal. He has no rales.  Musculoskeletal: He exhibits no edema.  Neurological: He is alert.  Weak with left grip, left hip flexion, extension of left leg.   Skin: Skin is warm and dry.  Psychiatric: He has a normal mood and affect.  Vitals reviewed.   Vitals:   08/22/17 1001  BP: (!) 90/50  Pulse: 71  Temp: 98.6 F (37 C)  TempSrc: Oral  SpO2: 92%  Weight: 113 lb (51.3 kg)  Height: 5\' 5"  (1.651 m)        Assessment & Plan:    Trayce Maino is a 52 y.o. male ESRD (end stage  renal disease) on dialysis ([place])  -Continue follow-up with nephrologist and dialysis 3 times per week.  Beta-blocker adjusted as below.  Congestive heart failure, unspecified HF chronicity, unspecified heart failure type ([place])  -  No sign of edema on exam currently.  Will lower beta-blocker as below as borderline hypotensive as well as fatigue.  Follow-up with cardiology as planned, advised to call for appointment.  Hypotension, unspecified hypotension type - Plan: carvedilol (COREG) 6.25 MG tablet  -Decreased carvedilol to 6.25 mg twice daily, monitor blood pressure and can discuss with nephrology/at dialysis  History of CVA with residual deficit  -Residual weakness as above.  Will complete paperwork for disability.  Has social work and physical therapy appointments pending.  If any specific assistive devices needed after evaluation with PT, I can prescribe those.  Continue statin, continue Plavix.  Diabetes briefly discussed as above, still significant variability in readings.   may be related to diet.  No change in meds.  Handout given on diabetes and nutrition.   Meds ordered this encounter  Medications  . carvedilol (COREG) 6.25 MG tablet    Sig: Take 1 tablet (6.25 mg total) by mouth 2 (two) times daily with a meal.    Dispense:  60 tablet    Refill:  1   Patient Instructions   Once PT sees you today, have let them let me know what specific braces or assistive devices are needed. Make sure they know you have a walker, but let me know if they recommend other walker.   I will request notes form nephrology and Dr. Ronnald Ramp.  Please Dr. Angelena Form for appointment, but I would recommend decreasing carvedilol to 6.25mg  twice per day for now. Have the nurse check home blood pressures as well as nephrology tomorrow at dialysis to monitor for other changes. Recheck with me in next 2 weeks.   For diabetes, no change in meds for now. Variable readings may be related to diet. See info below, but  make sure to still adhere to diet recommendations for kidneys.    Diabetes Mellitus and Nutrition When you have diabetes (diabetes mellitus), it is very important to have healthy eating habits because your blood sugar (glucose) levels are greatly affected by what you eat and drink. Eating healthy foods in the appropriate amounts, at about the same times every day, can help you:  Control your blood glucose.  Lower your risk of heart disease.  Improve your blood pressure.  Reach or maintain a healthy weight.  Every person with diabetes is different, and each person has different needs for a meal plan. Your health care provider may recommend that you work with a diet and nutrition specialist (dietitian) to make a meal plan that is best for you. Your meal plan may vary depending on factors such as:  The calories you need.  The medicines you take.  Your weight.  Your blood glucose, blood pressure, and cholesterol levels.  Your activity level.  Other health conditions you have, such as heart or kidney disease.  How do carbohydrates affect me? Carbohydrates affect your blood glucose level more than any other type of food. Eating carbohydrates naturally increases the amount of glucose in your blood. Carbohydrate counting is a method for keeping track of how many carbohydrates you eat. Counting carbohydrates is important to keep your blood glucose at a healthy level, especially if you use insulin or take certain oral diabetes medicines. It is important to know how many carbohydrates you can safely have in each meal. This is different for every person. Your dietitian can help you calculate how many carbohydrates you should have at each meal and for snack. Foods that contain carbohydrates include:  Bread, cereal, rice, pasta,  and crackers.  Potatoes and corn.  Peas, beans, and lentils.  Milk and yogurt.  Fruit and juice.  Desserts, such as cakes, cookies, ice cream, and candy.  How  does alcohol affect me? Alcohol can cause a sudden decrease in blood glucose (hypoglycemia), especially if you use insulin or take certain oral diabetes medicines. Hypoglycemia can be a life-threatening condition. Symptoms of hypoglycemia (sleepiness, dizziness, and confusion) are similar to symptoms of having too much alcohol. If your health care provider says that alcohol is safe for you, follow these guidelines:  Limit alcohol intake to no more than 1 drink per day for nonpregnant women and 2 drinks per day for men. One drink equals 12 oz of beer, 5 oz of wine, or 1 oz of hard liquor.  Do not drink on an empty stomach.  Keep yourself hydrated with water, diet soda, or unsweetened iced tea.  Keep in mind that regular soda, juice, and other mixers may contain a lot of sugar and must be counted as carbohydrates.  What are tips for following this plan? Reading food labels  Start by checking the serving size on the label. The amount of calories, carbohydrates, fats, and other nutrients listed on the label are based on one serving of the food. Many foods contain more than one serving per package.  Check the total grams (g) of carbohydrates in one serving. You can calculate the number of servings of carbohydrates in one serving by dividing the total carbohydrates by 15. For example, if a food has 30 g of total carbohydrates, it would be equal to 2 servings of carbohydrates.  Check the number of grams (g) of saturated and trans fats in one serving. Choose foods that have low or no amount of these fats.  Check the number of milligrams (mg) of sodium in one serving. Most people should limit total sodium intake to less than 2,300 mg per day.  Always check the nutrition information of foods labeled as "low-fat" or "nonfat". These foods may be higher in added sugar or refined carbohydrates and should be avoided.  Talk to your dietitian to identify your daily goals for nutrients listed on the  label. Shopping  Avoid buying canned, premade, or processed foods. These foods tend to be high in fat, sodium, and added sugar.  Shop around the outside edge of the grocery store. This includes fresh fruits and vegetables, bulk grains, fresh meats, and fresh dairy. Cooking  Use low-heat cooking methods, such as baking, instead of high-heat cooking methods like deep frying.  Cook using healthy oils, such as olive, canola, or sunflower oil.  Avoid cooking with butter, cream, or high-fat meats. Meal planning  Eat meals and snacks regularly, preferably at the same times every day. Avoid going long periods of time without eating.  Eat foods high in fiber, such as fresh fruits, vegetables, beans, and whole grains. Talk to your dietitian about how many servings of carbohydrates you can eat at each meal.  Eat 4-6 ounces of lean protein each day, such as lean meat, chicken, fish, eggs, or tofu. 1 ounce is equal to 1 ounce of meat, chicken, or fish, 1 egg, or 1/4 cup of tofu.  Eat some foods each day that contain healthy fats, such as avocado, nuts, seeds, and fish. Lifestyle   Check your blood glucose regularly.  Exercise at least 30 minutes 5 or more days each week, or as told by your health care provider.  Take medicines as told by your health  care provider.  Do not use any products that contain nicotine or tobacco, such as cigarettes and e-cigarettes. If you need help quitting, ask your health care provider.  Work with a Social worker or diabetes educator to identify strategies to manage stress and any emotional and social challenges. What are some questions to ask my health care provider?  Do I need to meet with a diabetes educator?  Do I need to meet with a dietitian?  What number can I call if I have questions?  When are the best times to check my blood glucose? Where to find more information:  American Diabetes Association: diabetes.org/food-and-fitness/food  Academy of  Nutrition and Dietetics: PokerClues.dk  [employer] of Diabetes and Digestive and Kidney Diseases (NIH): ContactWire.be Summary  A healthy meal plan will help you control your blood glucose and maintain a healthy lifestyle.  Working with a diet and nutrition specialist (dietitian) can help you make a meal plan that is best for you.  Keep in mind that carbohydrates and alcohol have immediate effects on your blood glucose levels. It is important to count carbohydrates and to use alcohol carefully. This information is not intended to replace advice given to you by your health care provider. Make sure you discuss any questions you have with your health care provider. Document Released: 10/19/2004 Document Revised: 02/27/2016 Document Reviewed: 02/27/2016 Elsevier Interactive Patient Education  2018 [employer].     IF you received an x-ray today, you will receive an invoice from [facility]. Please contact [facility] at [phone] with questions or concerns regarding your invoice.   IF you received labwork today, you will receive an invoice from [facility]. Please contact [facility] at [phone] with questions or concerns regarding your invoice.   Our billing staff will not be able to assist you with questions regarding bills from these companies.  You will be contacted with the lab results as soon as they are available. The fastest way to get your results is to activate your My Chart account. Instructions are located on the last page of this paperwork. If you have not heard from [place] regarding the results in 2 weeks, please contact this office.       Signed,   Merri Ray, MD [facility] [place].  08/22/17 11:11 AM

## 2017-08-22 NOTE — Telephone Encounter (Signed)
Copied from [place] [id number]. Topic: Quick Communication - See Telephone Encounter >> Aug 22, 2017  3:25 PM Bea Graff, NT wrote: CRM for notification. See Telephone encounter for: 08/22/17. Ayesha, pts wife, calling and states the patients glucometer is a One Touch Verio Flex and they just need the test strips and lancets sent to the pharmacy. [facility] [address] [place] [phone] (Phone) [phone] (Fax)    CB#: [phone]

## 2017-08-22 NOTE — Patient Instructions (Addendum)
Once PT sees you today, have let them let me know what specific braces or assistive devices are needed. Make sure they know you have a walker, but let me know if they recommend other walker.   I will request notes form nephrology and Dr. Ronnald Ramp.  Please Dr. Angelena Form for appointment, but I would recommend decreasing carvedilol to 6.25mg  twice per day for now. Have the nurse check home blood pressures as well as nephrology tomorrow at dialysis to monitor for other changes. Recheck with me in next 2 weeks.   For diabetes, no change in meds for now. Variable readings may be related to diet. See info below, but make sure to still adhere to diet recommendations for kidneys.    Diabetes Mellitus and Nutrition When you have diabetes (diabetes mellitus), it is very important to have healthy eating habits because your blood sugar (glucose) levels are greatly affected by what you eat and drink. Eating healthy foods in the appropriate amounts, at about the same times every day, can help you:  Control your blood glucose.  Lower your risk of heart disease.  Improve your blood pressure.  Reach or maintain a healthy weight.  Every person with diabetes is different, and each person has different needs for a meal plan. Your health care provider may recommend that you work with a diet and nutrition specialist (dietitian) to make a meal plan that is best for you. Your meal plan may vary depending on factors such as:  The calories you need.  The medicines you take.  Your weight.  Your blood glucose, blood pressure, and cholesterol levels.  Your activity level.  Other health conditions you have, such as heart or kidney disease.  How do carbohydrates affect me? Carbohydrates affect your blood glucose level more than any other type of food. Eating carbohydrates naturally increases the amount of glucose in your blood. Carbohydrate counting is a method for keeping track of how many carbohydrates you eat.  Counting carbohydrates is important to keep your blood glucose at a healthy level, especially if you use insulin or take certain oral diabetes medicines. It is important to know how many carbohydrates you can safely have in each meal. This is different for every person. Your dietitian can help you calculate how many carbohydrates you should have at each meal and for snack. Foods that contain carbohydrates include:  Bread, cereal, rice, pasta, and crackers.  Potatoes and corn.  Peas, beans, and lentils.  Milk and yogurt.  Fruit and juice.  Desserts, such as cakes, cookies, ice cream, and candy.  How does alcohol affect me? Alcohol can cause a sudden decrease in blood glucose (hypoglycemia), especially if you use insulin or take certain oral diabetes medicines. Hypoglycemia can be a life-threatening condition. Symptoms of hypoglycemia (sleepiness, dizziness, and confusion) are similar to symptoms of having too much alcohol. If your health care provider says that alcohol is safe for you, follow these guidelines:  Limit alcohol intake to no more than 1 drink per day for nonpregnant women and 2 drinks per day for men. One drink equals 12 oz of beer, 5 oz of wine, or 1 oz of hard liquor.  Do not drink on an empty stomach.  Keep yourself hydrated with water, diet soda, or unsweetened iced tea.  Keep in mind that regular soda, juice, and other mixers may contain a lot of sugar and must be counted as carbohydrates.  What are tips for following this plan? Reading food labels  Start by checking  the serving size on the label. The amount of calories, carbohydrates, fats, and other nutrients listed on the label are based on one serving of the food. Many foods contain more than one serving per package.  Check the total grams (g) of carbohydrates in one serving. You can calculate the number of servings of carbohydrates in one serving by dividing the total carbohydrates by 15. For example, if a food  has 30 g of total carbohydrates, it would be equal to 2 servings of carbohydrates.  Check the number of grams (g) of saturated and trans fats in one serving. Choose foods that have low or no amount of these fats.  Check the number of milligrams (mg) of sodium in one serving. Most people should limit total sodium intake to less than 2,300 mg per day.  Always check the nutrition information of foods labeled as "low-fat" or "nonfat". These foods may be higher in added sugar or refined carbohydrates and should be avoided.  Talk to your dietitian to identify your daily goals for nutrients listed on the label. Shopping  Avoid buying canned, premade, or processed foods. These foods tend to be high in fat, sodium, and added sugar.  Shop around the outside edge of the grocery store. This includes fresh fruits and vegetables, bulk grains, fresh meats, and fresh dairy. Cooking  Use low-heat cooking methods, such as baking, instead of high-heat cooking methods like deep frying.  Cook using healthy oils, such as olive, canola, or sunflower oil.  Avoid cooking with butter, cream, or high-fat meats. Meal planning  Eat meals and snacks regularly, preferably at the same times every day. Avoid going long periods of time without eating.  Eat foods high in fiber, such as fresh fruits, vegetables, beans, and whole grains. Talk to your dietitian about how many servings of carbohydrates you can eat at each meal.  Eat 4-6 ounces of lean protein each day, such as lean meat, chicken, fish, eggs, or tofu. 1 ounce is equal to 1 ounce of meat, chicken, or fish, 1 egg, or 1/4 cup of tofu.  Eat some foods each day that contain healthy fats, such as avocado, nuts, seeds, and fish. Lifestyle   Check your blood glucose regularly.  Exercise at least 30 minutes 5 or more days each week, or as told by your health care provider.  Take medicines as told by your health care provider.  Do not use any products that  contain nicotine or tobacco, such as cigarettes and e-cigarettes. If you need help quitting, ask your health care provider.  Work with a Social worker or diabetes educator to identify strategies to manage stress and any emotional and social challenges. What are some questions to ask my health care provider?  Do I need to meet with a diabetes educator?  Do I need to meet with a dietitian?  What number can I call if I have questions?  When are the best times to check my blood glucose? Where to find more information:  American Diabetes Association: diabetes.org/food-and-fitness/food  Academy of Nutrition and Dietetics: PokerClues.dk  [employer] of Diabetes and Digestive and Kidney Diseases (NIH): ContactWire.be Summary  A healthy meal plan will help you control your blood glucose and maintain a healthy lifestyle.  Working with a diet and nutrition specialist (dietitian) can help you make a meal plan that is best for you.  Keep in mind that carbohydrates and alcohol have immediate effects on your blood glucose levels. It is important to count carbohydrates and to  use alcohol carefully. This information is not intended to replace advice given to you by your health care provider. Make sure you discuss any questions you have with your health care provider. Document Released: 10/19/2004 Document Revised: 02/27/2016 Document Reviewed: 02/27/2016 Elsevier Interactive Patient Education  2018 [employer].     IF you received an x-ray today, you will receive an invoice from [facility]. Please contact [facility] at [phone] with questions or concerns regarding your invoice.   IF you received labwork today, you will receive an invoice from [place]. Please contact [facility] at [phone] with questions or concerns regarding your invoice.   Our  billing staff will not be able to assist you with questions regarding bills from these companies.  You will be contacted with the lab results as soon as they are available. The fastest way to get your results is to activate your My Chart account. Instructions are located on the last page of this paperwork. If you have not heard from [place] regarding the results in 2 weeks, please contact this office.

## 2017-08-23 NOTE — Telephone Encounter (Signed)
Patient's family member noted yesterday that a page may have been missing.  Can we check to see if there are other pages than provided? Thanks.

## 2017-08-25 ENCOUNTER — Encounter: Payer: Self-pay | Admitting: Family Medicine

## 2017-08-26 ENCOUNTER — Other Ambulatory Visit: Payer: Self-pay

## 2017-08-26 NOTE — Telephone Encounter (Signed)
We have all the paperwork that the patient brought in the first time they were seen. She did state she thought one was missing and was going to check once she got home but I have not heard anything back from them.  Have we gotten these forms completed?

## 2017-08-26 NOTE — Telephone Encounter (Signed)
Patient wife is calling to follow up with this message. Please advise and contact.

## 2017-08-27 ENCOUNTER — Other Ambulatory Visit: Payer: Self-pay

## 2017-08-27 ENCOUNTER — Ambulatory Visit: Payer: BC Managed Care – PPO | Admitting: Family Medicine

## 2017-08-27 NOTE — Telephone Encounter (Signed)
Pts wife calling to check status on disability paper work. She states they were due on Friday. Please call wife with an update.

## 2017-08-27 NOTE — Telephone Encounter (Signed)
Paperwork completed as much as possible.  If further details are needed, I can meet with patient and family again, or may need mental health provider to further assess.

## 2017-08-27 NOTE — Telephone Encounter (Signed)
Pts wife calling to follow up with message below. She states this is a urgent request as they have been waiting since last Thursday. Please advise.

## 2017-08-28 NOTE — Telephone Encounter (Signed)
Paperwork scanned and faxed on 08/28/17

## 2017-08-29 ENCOUNTER — Telehealth: Payer: Self-pay | Admitting: Family Medicine

## 2017-08-29 NOTE — Telephone Encounter (Signed)
Orders needed for pt

## 2017-08-29 NOTE — Telephone Encounter (Signed)
Copied from [place] [id number]. Topic: General - Other >> Aug 29, 2017  1:18 PM Keene Breath wrote: Reason for CRM: Izora Gala from [facility] called to give verbal orders for patient.  Speech Therapy requested due to noted cognitive decline - forgetful and confused.  CB# [id number].

## 2017-08-30 ENCOUNTER — Other Ambulatory Visit: Payer: Self-pay

## 2017-08-30 MED ORDER — ONETOUCH LANCETS MISC: 3 refills | Status: DC

## 2017-08-30 NOTE — Telephone Encounter (Signed)
Call to pt number.  Wife states pt tests four times daily and needs refill of supplies.  Advised unable to discuss pt but just needed to confirm frequency of testing prior to handling refill.  Ok per Dr. Carlota Raspberry to order lancets and strips.  Call to pharmacy, confirmed types of test strips/lancets previously provided.  Refill sent.

## 2017-08-30 NOTE — Telephone Encounter (Signed)
Patient wife is upset she has called multiple times for these RXs to be sent in the pharmacy.Please see below messages . She states she will call back this afternoon

## 2017-09-02 NOTE — Telephone Encounter (Signed)
Called and left VM.  Will try to reach again later.

## 2017-09-02 NOTE — Telephone Encounter (Signed)
Verbal order given  

## 2017-09-04 DIAGNOSIS — Z0271 Encounter for disability determination: Secondary | ICD-10-CM

## 2017-09-05 ENCOUNTER — Telehealth: Payer: Self-pay | Admitting: Family Medicine

## 2017-09-05 ENCOUNTER — Other Ambulatory Visit: Payer: Self-pay

## 2017-09-05 ENCOUNTER — Encounter: Payer: Self-pay | Admitting: Family Medicine

## 2017-09-05 ENCOUNTER — Ambulatory Visit: Payer: BC Managed Care – PPO | Admitting: Family Medicine

## 2017-09-05 VITALS — BP 101/56 | HR 66 | Temp 97.9°F | Ht 65.0 in

## 2017-09-05 DIAGNOSIS — N186 End stage renal disease: Secondary | ICD-10-CM

## 2017-09-05 DIAGNOSIS — I10 Essential (primary) hypertension: Secondary | ICD-10-CM | POA: Diagnosis not present

## 2017-09-05 DIAGNOSIS — E1122 Type 2 diabetes mellitus with diabetic chronic kidney disease: Secondary | ICD-10-CM

## 2017-09-05 DIAGNOSIS — Z794 Long term (current) use of insulin: Secondary | ICD-10-CM

## 2017-09-05 DIAGNOSIS — R05 Cough: Secondary | ICD-10-CM

## 2017-09-05 DIAGNOSIS — Z992 Dependence on renal dialysis: Secondary | ICD-10-CM

## 2017-09-05 DIAGNOSIS — I6930 Unspecified sequelae of cerebral infarction: Secondary | ICD-10-CM

## 2017-09-05 DIAGNOSIS — R059 Cough, unspecified: Secondary | ICD-10-CM

## 2017-09-05 LAB — HEMOGLOBIN A1C
ESTIMATED AVERAGE GLUCOSE: 148 mg/dL
HEMOGLOBIN A1C: 6.8 % — AB (ref 4.8–5.6)

## 2017-09-05 NOTE — Patient Instructions (Addendum)
With history of stroke, I do want you to meet with neurology and will refer you again.   If any change in cough, or new symptoms such as fever or shortness of breath - return right away to discuss further. I would like to recheck the cough in the next 3-4 weeks.   Blood pressure better but still on the lower side. If still running low at home or at dialysis, we may need to decrease further.Keep a record of your blood pressures outside of the office once per day and bring them by in the next 2 weeks.   Due to variability in blood sugar readings, I will refer you to endocrinologist to make sure you are on the right treatment. I will check the A1c today.  Schedule eye doctor appointment. Fasting labs (cholesterol) at next visit.  Let me know the date and where you had the pneumonia vaccine.    How to Take Your Blood Pressure You can take your blood pressure at home with a machine. You may need to check your blood pressure at home:  To check if you have high blood pressure (hypertension).  To check your blood pressure over time.  To make sure your blood pressure medicine is working.  Supplies needed: You will need a blood pressure machine, or monitor. You can buy one at a drugstore or online. When choosing one:  Choose one with an arm cuff.  Choose one that wraps around your upper arm. Only one finger should fit between your arm and the cuff.  Do not choose one that measures your blood pressure from your wrist or finger.  Your doctor can suggest a monitor. How to prepare Avoid these things for 30 minutes before checking your blood pressure:  Drinking caffeine.  Drinking alcohol.  Eating.  Smoking.  Exercising.  Five minutes before checking your blood pressure:  Pee.  Sit in a dining chair. Avoid sitting in a soft couch or armchair.  Be quiet. Do not talk.  How to take your blood pressure Follow the instructions that came with your machine. If you have a digital  blood pressure monitor, these may be the instructions: 1. Sit up straight. 2. Place your feet on the floor. Do not cross your ankles or legs. 3. Rest your left arm at the level of your heart. You may rest it on a table, desk, or chair. 4. Pull up your shirt sleeve. 5. Wrap the blood pressure cuff around the upper part of your left arm. The cuff should be 1 inch (2.5 cm) above your elbow. It is best to wrap the cuff around bare skin. 6. Fit the cuff snugly around your arm. You should be able to place only one finger between the cuff and your arm. 7. Put the cord inside the groove of your elbow. 8. Press the power button. 9. Sit quietly while the cuff fills with air and loses air. 10. Write down the numbers on the screen. 11. Wait 2-3 minutes and then repeat steps 1-10.  What do the numbers mean? Two numbers make up your blood pressure. The first number is called systolic pressure. The second is called diastolic pressure. An example of a blood pressure reading is "120 over 80" (or 120/80). If you are an adult and do not have a medical condition, use this guide to find out if your blood pressure is normal: Normal  First number: below 120.  Second number: below 80. Elevated  First number: 120-129.  Second number:  below 80. Hypertension stage 1  First number: 130-139.  Second number: 80-89. Hypertension stage 2  First number: 140 or above.  Second number: 90 or above. Your blood pressure is above normal even if only the top or bottom number is above normal. Follow these instructions at home:  Check your blood pressure as often as your doctor tells you to.  Take your monitor to your next doctor's appointment. Your doctor will: ? Make sure you are using it correctly. ? Make sure it is working right.  Make sure you understand what your blood pressure numbers should be.  Tell your doctor if your medicines are causing side effects. Contact a doctor if:  Your blood pressure  keeps being high. Get help right away if:  Your first blood pressure number is higher than 180.  Your second blood pressure number is higher than 120. This information is not intended to replace advice given to you by your health care provider. Make sure you discuss any questions you have with your health care provider. Document Released: 01/05/2008 Document Revised: 12/21/2015 Document Reviewed: 07/01/2015 Elsevier Interactive Patient Education  2018 Elsevier Inc.   Type 2 Diabetes Mellitus, Self Care, Adult When you have type 2 diabetes (type 2 diabetes mellitus), you must keep your blood sugar (glucose) under control. You can do this with:  Nutrition.  Exercise.  Lifestyle changes.  Medicines or insulin, if needed.  Support from your doctors and others.  How do I manage my blood sugar?  Check your blood sugar level every day, as often as told.  Call your doctor if your blood sugar is above your goal numbers for 2 tests in a row.  Have your A1c (hemoglobin A1c) level checked at least two times a year. Have it checked more often if your doctor tells you to. Your doctor will set treatment goals for you. Generally, you should have these blood sugar levels:  Before meals (preprandial): 80-130 mg/dL (4.4-7.2 mmol/L).  After meals (postprandial): lower than 180 mg/dL (10 mmol/L).  A1c level: less than 7%.  What do I need to know about high blood sugar? High blood sugar is called hyperglycemia. Know the signs of high blood sugar. Signs may include:  Feeling: ? Thirsty. ? Hungry. ? Very tired.  Needing to pee (urinate) more than usual.  Blurry vision.  What do I need to know about low blood sugar? Low blood sugar is called hypoglycemia. This is when blood sugar is at or below 70 mg/dL (3.9 mmol/L). Symptoms may include:  Feeling: ? Hungry. ? Worried or nervous (anxious). ? Sweaty and clammy. ? Confused. ? Dizzy. ? Sleepy. ? Sick to your stomach  (nauseous).  Having: ? A fast heartbeat (palpitations). ? A headache. ? A change in your vision. ? Jerky movements that you cannot control (seizure). ? Nightmares. ? Tingling or no feeling (numbness) around the mouth, lips, or tongue.  Having trouble with: ? Talking. ? Paying attention (concentrating). ? Moving (coordination). ? Sleeping.  Shaking.  Passing out (fainting).  Getting upset easily (irritability).  Treating low blood sugar  To treat low blood sugar, eat or drink something sugary right away. If you can think clearly and swallow safely, follow the 15:15 rule:  Take 15 grams of a fast-acting carb (carbohydrate). Some fast-acting carbs are: ? 1 tube of glucose gel. ? 3 sugar tablets (glucose pills). ? 6-8 pieces of hard candy. ? 4 oz (120 mL) of fruit juice. ? 4 oz (120 mL) regular (not diet)   soda.  Check your blood sugar 15 minutes after you take the carb.  If your blood sugar is still at or below 70 mg/dL (3.9 mmol/L), take 15 grams of a carb again.  If your blood sugar does not go above 70 mg/dL (3.9 mmol/L) after 3 tries, get help right away.  After your blood sugar goes back to normal, eat a meal or a snack within 1 hour.  Treating very low blood sugar If your blood sugar is at or below 54 mg/dL (3 mmol/L), you have very low blood sugar (severe hypoglycemia). This is an emergency. Do not wait to see if the symptoms will go away. Get medical help right away. Call your local emergency services (911 in the [place] Do not drive yourself to the hospital. If you have very low blood sugar and you cannot eat or drink, you may need a glucagon shot (injection). A family member or friend should learn how to check your blood sugar and how to give you a glucagon shot. Ask your doctor if you need to have a glucagon shot kit at home. What else is important to manage my diabetes? Medicine Follow these instructions about insulin and diabetes medicines:  Take them as told  by your doctor.  Adjust them as told by your doctor.  Do not run out of them.  Having diabetes can raise your risk for other long-term conditions. These include heart or kidney disease. Your doctor may prescribe medicines to help prevent problems from diabetes. Food   Make healthy food choices. These include: ? Chicken, fish, egg whites, and beans. ? Oats, whole wheat, bulgur, brown rice, quinoa, and millet. ? Fresh fruits and vegetables. ? Low-fat dairy products. ? Nuts, avocado, olive oil, and canola oil.  Make a food plan with a specialist (dietitian).  Follow instructions from your doctor about what you cannot eat or drink.  Drink enough fluid to keep your pee (urine) clear or pale yellow.  Eat healthy snacks between healthy meals.  Keep track of carbs that you eat. Read food labels. Learn food serving sizes.  Follow your sick day plan when you cannot eat or drink normally. Make this plan with your doctor so it is ready to use. Activity  Exercise at least 3 times a week.  Do not go more than 2 days without exercising.  Talk with your doctor before you start a new exercise. Your doctor may need to adjust your insulin, medicines, or food. Lifestyle   Do not use any tobacco products. These include cigarettes, chewing tobacco, and e-cigarettes.If you need help quitting, ask your doctor.  Ask your doctor how much alcohol is safe for you.  Learn to deal with stress. If you need help with this, ask your doctor. Body care  Stay up to date with your shots (immunizations).  Have your eyes and feet checked by a doctor as often as told.  Check your skin and feet every day. Check for cuts, bruises, redness, blisters, or sores.  Brush your teeth and gums two times a day.  Floss at least one time a day.  Go to the dentist least one time every 6 months.  Stay at a healthy weight. General instructions   Take over-the-counter and prescription medicines only as told by  your doctor.  Share your diabetes care plan with: ? Your work or school. ? People you live with.  Check your pee (urine) for ketones: ? When you are sick. ? As told by your doctor.  Carry a card or wear jewelry that says that you have diabetes.  Ask your doctor: ? Do I need to meet with a diabetes educator? ? Where can I find a support group for people with diabetes?  Keep all follow-up visits as told by your doctor. This is important. Where to find more information: To learn more about diabetes, visit:  American Diabetes Association: www.diabetes.org  American Association of Diabetes Educators: www.diabeteseducator.org/patient-resources  This information is not intended to replace advice given to you by your health care provider. Make sure you discuss any questions you have with your health care provider. Document Released: 05/16/2015 Document Revised: 06/30/2015 Document Reviewed: 02/25/2015 Elsevier Interactive Patient Education  2018 Elsevier Inc.   IF you received an x-ray today, you will receive an invoice from [facility]. Please contact [facility] at [phone] with questions or concerns regarding your invoice.   IF you received labwork today, you will receive an invoice from [facility]. Please contact [facility] at [phone] with questions or concerns regarding your invoice.   Our billing staff will not be able to assist you with questions regarding bills from these companies.  You will be contacted with the lab results as soon as they are available. The fastest way to get your results is to activate your My Chart account. Instructions are located on the last page of this paperwork. If you have not heard from us regarding the results in 2 weeks, please contact this office.      

## 2017-09-05 NOTE — Progress Notes (Signed)
Subjective:    Patient ID: Jeffery Young, male    DOB: 02-23-1965, 52 y.o.   MRN: [id number]  HPI Jeffery Young is a 52 y.o. male Presents today for: Chief Complaint  Patient presents with  . Blood Pressure Check  . Disability    paperwork   Here for follow-up.  Initially seen to establish care on July 11.  Most recently seen July 18.  History of multiple medical problems including CAD with prior Nstemi, insulin-dependent diabetes, end-stage renal disease on dialysis, heart failure, depression, prior stroke.  History of stroke: He has been continue aspirin Plavix.  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  Has had some confusion previously including in the hospital, and has been treated for depression.  Currently taking Zoloft.  It appears he was referred in January from prior primary care provider to [facility] neurology due to forgetfulness, speech issues and left weakness, but I do not see recent evaluation with neurology.  recently verbal orders were also given for speech therapy evaluation due to cognitive decline and forgetfulness and being confused. Does not remember meeting with neuro.  Denies new speech issues, weakness or any difficulty swallowing. Denies choking.   End-stage renal disease on hemodialysis, with hx of HTN.  HD Monday Wednesday and Friday.  Blood pressure was slightly low last time.  We decreased the carvedilol to 6.25 mg twice daily. Plan for cardiology follow up for hx of CHF, CAD with hx of NSTEMI. Continued on statin. Feels similar to last visit, but maybe less fatigue. appt with cardiology September 5th.  BP Readings from Last 3 Encounters:  09/05/17 (!) 101/56  08/22/17 (!) 90/50  08/15/17 (!) 110/50   Diabetes: See last OV.  Variable readings reviewed last visit.  Suspected related to diet.  Handout given on  diabetes and nutrition. Lab Results  Component Value Date   HGBA1C 6.9 (H) 05/29/2017  here with daughter today. Lunchtime and dinner has required sliding scale, glucose up to 300, sometimes in 200s, but 70-90 in the morning.  Lab Results  Component Value Date   CHOL 191 02/14/2016   HDL 52 02/14/2016   LDLCALC 102 (H) 02/14/2016   TRIG 184 (H) 02/14/2016   CHOLHDL 3.7 02/14/2016  not fasting today.  Unknown last optho.  Pneumovax  - thinks he had in past few weeks - will check to see where he had this done.    Cough: "smokers cough", nonproductive, no fever/dyspnea. No changes recently. 1 view CXR in May without airspace disease, PTX or pleural effusion. Reassuring 2 view CXR in April.   Patient Active Problem List   Diagnosis Date Noted  . ESRD (end stage renal disease) on dialysis ([place]) 06/25/2017  . Uncontrolled hypertension 05/28/2017  . Depression 05/28/2017  . Normocytic anemia 05/28/2017  . History of completed stroke 05/28/2017  . Syncope 03/13/2016  . Ischemic cardiomyopathy 03/13/2016  . Unstable angina pectoris ([place]) 01/11/2016  . Hypertensive heart disease with heart failure ([place])   . Chronic combined systolic and diastolic heart failure ([place])   . Elevated troponin   . Chest pain 01/10/2016  . Coronary artery disease due to lipid rich plaque 01/10/2016  . Status post coronary artery stent placement   . Type 2 diabetes mellitus with diabetic nephropathy, without long-term current use of insulin ([place])   . Tobacco abuse   .  Essential hypertension   . Hypercholesteremia   . NSTEMI (non-ST elevated myocardial infarction) ([employer]) 12/22/2015  . Lightheadedness 08/22/2012  . Dehydration 08/22/2012   Past Medical History:  Diagnosis Date  . Coronary artery disease    a.12/22/15: NSTEMI s/p overlapping DES x2 and balloon angioplasty to distal AV groove Circumflex (too small for a stent).   . Depression   . ESRD (end stage renal disease) on dialysis ([place])   .  Hypercholesteremia   . Hypertension   . Prostate infection   . Tobacco abuse   . Type II diabetes mellitus ([place])    Past Surgical History:  Procedure Laterality Date  . BASCILIC VEIN TRANSPOSITION Left 06/28/2017   Procedure: LEFT UPPER ARM ARTERIOVENOUS GORTEX-GRAFT PLACEMENT;  Surgeon: Angelia Mould, MD;  Location: [place];  Service: Vascular;  Laterality: Left;  . CARDIAC CATHETERIZATION N/A 12/22/2015   Procedure: Left Heart Cath and Coronary Angiography;  Surgeon: Burnell Blanks, MD;  Location: [place] [facility];  Service: Cardiovascular;  Laterality: N/A;  . CARDIAC CATHETERIZATION N/A 12/22/2015   Procedure: Coronary Stent Intervention;  Surgeon: Burnell Blanks, MD;  Location: [place] [facility];  Service: Cardiovascular;  Laterality: N/A;  . CARDIAC CATHETERIZATION N/A 01/11/2016   Procedure: Left Heart Cath and Coronary Angiography;  Surgeon: Sherren Mocha, MD;  Location: [place] [facility];  Service: Cardiovascular;  Laterality: N/A;  . CARDIAC CATHETERIZATION N/A 01/11/2016   Procedure: Intravascular Pressure Wire/FFR Study;  Surgeon: Sherren Mocha, MD;  Location: [place] [facility];  Service: Cardiovascular;  Laterality: N/A;  . CARDIAC CATHETERIZATION N/A 01/11/2016   Procedure: Coronary Stent Intervention;  Surgeon: Sherren Mocha, MD;  Location: [place] [facility];  Service: Cardiovascular;  Laterality: N/A;  . CORONARY ANGIOPLASTY WITH STENT PLACEMENT  12/22/2015  . INSERTION OF DIALYSIS CATHETER Right 06/28/2017   Procedure: INSERTION OF DIALYSIS CATHETER RIGHT INTERNAL JUGULAR;  Surgeon: Angelia Mould, MD;  Location[facility];  Service: Vascular;  Laterality: Right;   No Known Allergies Prior to Admission medications   Medication Sig Start Date End Date Taking? Authorizing Provider  aspirin 81 MG chewable tablet Chew 1 tablet (81 mg total) by mouth daily. 08/15/17   [name], MD  atorvastatin (LIPITOR) 10 MG tablet Take 1 tablet (10 mg  total) by mouth daily. 08/15/17   [name], MD  calcitRIOL (ROCALTROL) 0.5 MCG capsule Take 1 capsule (0.5 mcg total) by mouth every Monday, Wednesday, and Friday with hemodialysis. 08/16/17   [name], MD  carvedilol (COREG) 6.25 MG tablet Take 1 tablet (6.25 mg total) by mouth 2 (two) times daily with a meal. 08/22/17   [name], MD  clopidogrel (PLAVIX) 75 MG tablet TAKE ONE TABLET BY MOUTH DAILY 04/10/17   Burnell Blanks, MD  docusate sodium (COLACE) 100 MG capsule Take 1 capsule (100 mg total) by mouth 2 (two) times daily. 08/15/17   [name], MD  insulin aspart (NOVOLOG) 100 UNIT/ML FlexPen Inject 0-10 Units into the skin 3 (three) times daily before meals. Sliding Scale: 0-150: 0u; 151-200: 2u; 201-250: 4u; 251-300: 6u; 301-350: 8u; 351-400:10u. 401+ Give 10u and call MD    [provider]  insulin glargine (LANTUS) 100 UNIT/ML injection Inject 0.07 mLs (7 Units total) into the skin at bedtime. 07/04/17   Bonnielee Haff, MD  multivitamin (RENA-VIT) TABS tablet Take 1 tablet by mouth at bedtime. 08/15/17   [name], MD  nitroGLYCERIN (NITROSTAT) 0.4 MG SL tablet  Place 1 tablet (0.4 mg total) under the tongue every 5 (five) minutes x 3 doses as needed for chest pain. 12/26/15   Cheryln Manly, NP  Nutritional Supplements (FEEDING SUPPLEMENT, NEPRO CARB STEADY,) LIQD Take 237 mLs by mouth 3 (three) times daily as needed (Supplement). 07/04/17   Bonnielee Haff, MD  ONE [facility] LANCETS MISC One Touch [facility] 75Z.  Please provide Verio test strips.  To test four times daily. 08/30/17   [name], MD  sertraline (ZOLOFT) 50 MG tablet Take 1 tablet (50 mg total) by mouth daily. 08/15/17   [name], MD  sevelamer carbonate (RENVELA) 800 MG tablet Take 1 tablet (800 mg total) by mouth 3 (three) times daily with meals. 08/15/17   [name], MD  traZODone (DESYREL) 50 MG tablet Take 1 tablet (50 mg total) by mouth at  bedtime. 08/15/17   [name], MD   Social History   Socioeconomic History  . Marital status: Married    Spouse name: Not on file  . Number of children: Not on file  . Years of education: Not on file  . Highest education level: Not on file  Occupational History  . Occupation: International aid/development worker: [employer]  Social Needs  . Financial resource strain: Not on file  . Food insecurity:    Worry: Not on file    Inability: Not on file  . Transportation needs:    Medical: Not on file    Non-medical: Not on file  Tobacco Use  . Smoking status: Former Smoker    Packs/day: 0.75    Years: 29.00    Pack years: 21.75    Types: Cigarettes    Last attempt to quit: 05/28/2017    Years since quitting: 0.2  . Smokeless tobacco: Never Used  Substance and Sexual Activity  . Alcohol use: Not Currently    Alcohol/week: 4.2 oz    Types: 7 Cans of beer per week    Frequency: Never    Comment: last drink was prior to last hospitalization  . Drug use: No  . Sexual activity: Not Currently  Lifestyle  . Physical activity:    Days per week: Not on file    Minutes per session: Not on file  . Stress: Not on file  Relationships  . Social connections:    Talks on phone: Not on file    Gets together: Not on file    Attends religious service: Not on file    Active member of club or organization: Not on file    Attends meetings of clubs or organizations: Not on file    Relationship status: Not on file  . Intimate partner violence:    Fear of current or ex partner: Not on file    Emotionally abused: Not on file    Physically abused: Not on file    Forced sexual activity: Not on file  Other Topics Concern  . Not on file  Social History Narrative  . Not on file     Review of Systems  Constitutional: Negative for fatigue and unexpected weight change.  Eyes: Negative for visual disturbance.  Respiratory: Negative for chest tightness and shortness of breath. Cough: episodic,  "smokers cough"   Cardiovascular: Negative for chest pain, palpitations and leg swelling.  Gastrointestinal: Negative for abdominal pain and blood in stool.  Neurological: Negative for dizziness, light-headedness and headaches.       Objective:  Physical Exam  Constitutional: He is oriented to person, place, and time. He appears well-developed and well-nourished.  HENT:  Head: Normocephalic and atraumatic.  Eyes: Pupils are equal, round, and reactive to light. EOM are normal.  Neck: No JVD present. Carotid bruit is not present.  Cardiovascular: Normal rate, regular rhythm and normal heart sounds.  No murmur heard. Pulmonary/Chest: Effort normal and breath sounds normal. He has no rales.  Musculoskeletal: He exhibits no edema.  Neurological: He is alert and oriented to person, place, and time.  Skin: Skin is warm and dry.  Psychiatric: He has a normal mood and affect.  Vitals reviewed.  Slight weakness left versus right grip and lower extremity.  tangible speech, responding appropriately. Vitals:   09/05/17 0940  BP: (!) 101/56  Pulse: 66  Temp: 97.9 F (36.6 C)  TempSrc: Oral  SpO2: 94%  Height: '5\' 5"'  (1.651 m)        Assessment & Plan:   Jeffery Young is a 52 y.o. male Type 2 diabetes mellitus with chronic kidney disease on chronic dialysis, with long-term current use of insulin ([place]) - Plan: Hemoglobin A1c, Ambulatory referral to Endocrinology  -Based on variable readings, insulin dependence, refer to endocrinology for further evaluation and treatment.  Check A1c.  Nutrition with diabetes handout given previously as appears to be significant factor.  Consider formal nutritionist evaluation  History of cerebrovascular accident (CVA) with residual deficit - Plan: Ambulatory referral to Neurology  -Referral placed to neurology as does not appear to have recent follow-up.  Continue Plavix, aspirin for now.  Therapy previously ordered, including speech therapy evaluation.   Note received from physical therapist after visit, apparently has had some falls without using the walker appropriately.  Asked staff to call and make sure he is using a walker including with ambulation to restroom.  If any recurrent falls, will need to follow-up in office to discuss further treatment.  Additionally has had some left foot drop and may need orthopedic evaluation or new brace  Cough  -Long-standing without recent changes.  Plan to recheck with possible repeat x-rays in the next 3 to 4 weeks, sooner if any worsening/new symptoms  Essential hypertension  -Still borderline low.  Plan to measure at home as well as around the time of dialysis as may need further changes to regimen.  RTC precautions.  No orders of the defined types were placed in this encounter.  Patient Instructions    With history of stroke, I do want you to meet with neurology and will refer you again.   If any change in cough, or new symptoms such as fever or shortness of breath - return right away to discuss further. I would like to recheck the cough in the next 3-4 weeks.   Blood pressure better but still on the lower side. If still running low at home or at dialysis, we may need to decrease further.Keep a record of your blood pressures outside of the office once per day and bring them by in the next 2 weeks.   Due to variability in blood sugar readings, I will refer you to endocrinologist to make sure you are on the right treatment. I will check the A1c today.  Schedule eye doctor appointment. Fasting labs (cholesterol) at next visit.  Let me know the date and where you had the pneumonia vaccine.    How to Take Your Blood Pressure You can take your blood pressure at home with a machine. You may need to  check your blood pressure at home:  To check if you have high blood pressure (hypertension).  To check your blood pressure over time.  To make sure your blood pressure medicine is working.  Supplies  needed: You will need a blood pressure machine, or monitor. You can buy one at a drugstore or online. When choosing one:  Choose one with an arm cuff.  Choose one that wraps around your upper arm. Only one finger should fit between your arm and the cuff.  Do not choose one that measures your blood pressure from your wrist or finger.  Your doctor can suggest a monitor. How to prepare Avoid these things for 30 minutes before checking your blood pressure:  Drinking caffeine.  Drinking alcohol.  Eating.  Smoking.  Exercising.  Five minutes before checking your blood pressure:  Pee.  Sit in a dining chair. Avoid sitting in a soft couch or armchair.  Be quiet. Do not talk.  How to take your blood pressure Follow the instructions that came with your machine. If you have a digital blood pressure monitor, these may be the instructions: 1. Sit up straight. 2. Place your feet on the floor. Do not cross your ankles or legs. 3. Rest your left arm at the level of your heart. You may rest it on a table, desk, or chair. 4. Pull up your shirt sleeve. 5. Wrap the blood pressure cuff around the upper part of your left arm. The cuff should be 1 inch (2.5 cm) above your elbow. It is best to wrap the cuff around bare skin. 6. Fit the cuff snugly around your arm. You should be able to place only one finger between the cuff and your arm. 7. Put the cord inside the groove of your elbow. 8. Press the power button. 9. Sit quietly while the cuff fills with air and loses air. 10. Write down the numbers on the screen. 11. Wait 2-3 minutes and then repeat steps 1-10.  What do the numbers mean? Two numbers make up your blood pressure. The first number is called systolic pressure. The second is called diastolic pressure. An example of a blood pressure reading is "120 over 80" (or 120/80). If you are an adult and do not have a medical condition, use this guide to find out if your blood pressure is  normal: Normal  First number: below 120.  Second number: below 80. Elevated  First number: 120-129.  Second number: below 80. Hypertension stage 1  First number: 130-139.  Second number: 80-89. Hypertension stage 2  First number: 140 or above.  Second number: 42 or above. Your blood pressure is above normal even if only the top or bottom number is above normal. Follow these instructions at home:  Check your blood pressure as often as your doctor tells you to.  Take your monitor to your next doctor's appointment. Your doctor will: ? Make sure you are using it correctly. ? Make sure it is working right.  Make sure you understand what your blood pressure numbers should be.  Tell your doctor if your medicines are causing side effects. Contact a doctor if:  Your blood pressure keeps being high. Get help right away if:  Your first blood pressure number is higher than 180.  Your second blood pressure number is higher than 120. This information is not intended to replace advice given to you by your health care provider. Make sure you discuss any questions you have with your health care provider.  Document Released: 01/05/2008 Document Revised: 12/21/2015 Document Reviewed: 07/01/2015 Elsevier Interactive Patient Education  2018 [employer].   Type 2 Diabetes Mellitus, Self Care, Adult When you have type 2 diabetes (type 2 diabetes mellitus), you must keep your blood sugar (glucose) under control. You can do this with:  Nutrition.  Exercise.  Lifestyle changes.  Medicines or insulin, if needed.  Support from your doctors and others.  How do I manage my blood sugar?  Check your blood sugar level every day, as often as told.  Call your doctor if your blood sugar is above your goal numbers for 2 tests in a row.  Have your A1c (hemoglobin A1c) level checked at least two times a year. Have it checked more often if your doctor tells you to. Your doctor will set  treatment goals for you. Generally, you should have these blood sugar levels:  Before meals (preprandial): 80-130 mg/dL (4.4-7.2 mmol/L).  After meals (postprandial): lower than 180 mg/dL (10 mmol/L).  A1c level: less than 7%.  What do I need to know about high blood sugar? High blood sugar is called hyperglycemia. Know the signs of high blood sugar. Signs may include:  Feeling: ? Thirsty. ? Hungry. ? Very tired.  Needing to pee (urinate) more than usual.  Blurry vision.  What do I need to know about low blood sugar? Low blood sugar is called hypoglycemia. This is when blood sugar is at or below 70 mg/dL (3.9 mmol/L). Symptoms may include:  Feeling: ? Hungry. ? Worried or nervous (anxious). ? Sweaty and clammy. ? Confused. ? Dizzy. ? Sleepy. ? Sick to your stomach (nauseous).  Having: ? A fast heartbeat (palpitations). ? A headache. ? A change in your vision. ? Jerky movements that you cannot control (seizure). ? Nightmares. ? Tingling or no feeling (numbness) around the mouth, lips, or tongue.  Having trouble with: ? Talking. ? Paying attention (concentrating). ? Moving (coordination). ? Sleeping.  Shaking.  Passing out (fainting).  Getting upset easily (irritability).  Treating low blood sugar  To treat low blood sugar, eat or drink something sugary right away. If you can think clearly and swallow safely, follow the 15:15 rule:  Take 15 grams of a fast-acting carb (carbohydrate). Some fast-acting carbs are: ? 1 tube of glucose gel. ? 3 sugar tablets (glucose pills). ? 6-8 pieces of hard candy. ? 4 oz (120 mL) of fruit juice. ? 4 oz (120 mL) regular (not diet) soda.  Check your blood sugar 15 minutes after you take the carb.  If your blood sugar is still at or below 70 mg/dL (3.9 mmol/L), take 15 grams of a carb again.  If your blood sugar does not go above 70 mg/dL (3.9 mmol/L) after 3 tries, get help right away.  After your blood sugar goes back  to normal, eat a meal or a snack within 1 hour.  Treating very low blood sugar If your blood sugar is at or below 54 mg/dL (3 mmol/L), you have very low blood sugar (severe hypoglycemia). This is an emergency. Do not wait to see if the symptoms will go away. Get medical help right away. Call your local emergency services (911 in the [place] Do not drive yourself to the hospital. If you have very low blood sugar and you cannot eat or drink, you may need a glucagon shot (injection). A family member or friend should learn how to check your blood sugar and how to give you a glucagon shot. Ask your doctor  if you need to have a glucagon shot kit at home. What else is important to manage my diabetes? Medicine Follow these instructions about insulin and diabetes medicines:  Take them as told by your doctor.  Adjust them as told by your doctor.  Do not run out of them.  Having diabetes can raise your risk for other long-term conditions. These include heart or kidney disease. Your doctor may prescribe medicines to help prevent problems from diabetes. Food   Make healthy food choices. These include: ? Chicken, fish, egg whites, and beans. ? Oats, whole wheat, bulgur, brown rice, quinoa, and millet. ? Fresh fruits and vegetables. ? Low-fat dairy products. ? Nuts, avocado, olive oil, and canola oil.  Make a food plan with a specialist (dietitian).  Follow instructions from your doctor about what you cannot eat or drink.  Drink enough fluid to keep your pee (urine) clear or pale yellow.  Eat healthy snacks between healthy meals.  Keep track of carbs that you eat. Read food labels. Learn food serving sizes.  Follow your sick day plan when you cannot eat or drink normally. Make this plan with your doctor so it is ready to use. Activity  Exercise at least 3 times a week.  Do not go more than 2 days without exercising.  Talk with your doctor before you start a new exercise. Your doctor may  need to adjust your insulin, medicines, or food. Lifestyle   Do not use any tobacco products. These include cigarettes, chewing tobacco, and e-cigarettes.If you need help quitting, ask your doctor.  Ask your doctor how much alcohol is safe for you.  Learn to deal with stress. If you need help with this, ask your doctor. Body care  Stay up to date with your shots (immunizations).  Have your eyes and feet checked by a doctor as often as told.  Check your skin and feet every day. Check for cuts, bruises, redness, blisters, or sores.  Brush your teeth and gums two times a day.  Floss at least one time a day.  Go to the dentist least one time every 6 months.  Stay at a healthy weight. General instructions   Take over-the-counter and prescription medicines only as told by your doctor.  Share your diabetes care plan with: ? Your work or school. ? People you live with.  Check your pee (urine) for ketones: ? When you are sick. ? As told by your doctor.  Carry a card or wear jewelry that says that you have diabetes.  Ask your doctor: ? Do I need to meet with a diabetes educator? ? Where can I find a support group for people with diabetes?  Keep all follow-up visits as told by your doctor. This is important. Where to find more information: To learn more about diabetes, visit:  American Diabetes Association: www.diabetes.org  American Association of Diabetes Educators: www.diabeteseducator.org/patient-resources  This information is not intended to replace advice given to you by your health care provider. Make sure you discuss any questions you have with your health care provider. Document Released: 05/16/2015 Document Revised: 06/30/2015 Document Reviewed: 02/25/2015 Elsevier Interactive Patient Education  2018 [employer].   IF you received an x-ray today, you will receive an invoice from [facility]. Please contact [facility] at [phone] with  questions or concerns regarding your invoice.   IF you received labwork today, you will receive an invoice from [facility]. Please contact [facility] at [phone] with questions or concerns regarding your invoice.  Our billing staff will not be able to assist you with questions regarding bills from these companies.  You will be contacted with the lab results as soon as they are available. The fastest way to get your results is to activate your My Chart account. Instructions are located on the last page of this paperwork. If you have not heard from [place] regarding the results in 2 weeks, please contact this office.       Signed,   Merri Ray, MD [facility] [place].  09/08/17 12:06 PM

## 2017-09-05 NOTE — Telephone Encounter (Signed)
Copied from [place] [phone]. Topic: Quick Communication - See Telephone Encounter >> Sep 05, 2017  3:02 PM Synthia Innocent wrote: CRM for notification. See Telephone encounter for: 09/05/17. [facility] order for Speech eval to be moved to next week effective 09/09/17, for 1 x a week for 1 week

## 2017-09-06 ENCOUNTER — Telehealth: Payer: Self-pay | Admitting: Family Medicine

## 2017-09-06 DIAGNOSIS — M21372 Foot drop, left foot: Secondary | ICD-10-CM

## 2017-09-06 NOTE — Telephone Encounter (Signed)
Note received from [facility] care.  Reportedly has had some falls without injury, and reportedly has not been using wlaker correctly and lifting foot. This was not discussed at visit yesterday.   Please call pt - remind to use walker appropriately at all times, and lift foot.  If having difficulty, may need to meet with ortho for eval for foot drop.  If any further falls - return to office to discuss.

## 2017-09-08 ENCOUNTER — Encounter: Payer: Self-pay | Admitting: Family Medicine

## 2017-09-09 NOTE — Telephone Encounter (Signed)
Dr. Carlota Raspberry ,  Spoke with patients daughter she states he tries to lift his foot and can't just drags it.  So would like a referral to ortho to be evaluated.  She was also advised for him to use walker and to come in if he keeps having falls.

## 2017-09-11 NOTE — Telephone Encounter (Signed)
Referral placed.

## 2017-09-12 ENCOUNTER — Telehealth: Payer: Self-pay | Admitting: Family Medicine

## 2017-09-12 NOTE — Telephone Encounter (Signed)
Copied from [place] [phone]. Topic: Quick Communication - See Telephone Encounter >> Sep 12, 2017 11:32 AM [name] A wrote: CRM for notification. See Telephone encounter for: 09/12/17. Jeffery Young  with [facility]- [phone]   Need Verbals Diagnosis of  cognitive communication deficit approved  1 week 2

## 2017-09-13 ENCOUNTER — Telehealth: Payer: Self-pay | Admitting: Family Medicine

## 2017-09-13 NOTE — Telephone Encounter (Signed)
Copied from [place] [phone]. Topic: Quick Communication - See Telephone Encounter >> Sep 13, 2017 10:54 AM [name] A wrote: CRM for notification. See Telephone encounter for: 09/13/17. Morene Antu With [facility] [phone]  She called in and wanted to tell dr that family would like to pursue Nursing home placement.   She is faxing over a FL2 to be filed out - fax to  [phone]

## 2017-09-16 ENCOUNTER — Telehealth: Payer: Self-pay | Admitting: Family Medicine

## 2017-09-16 NOTE — Telephone Encounter (Signed)
Copied from [place] [phone]. Topic: Quick Communication - See Telephone Encounter >> Sep 16, 2017  2:16 PM Burchel, Abbi R wrote: CRM for notification. See Telephone encounter for: 09/16/17.  Cindee Salt ([facility])  questing v/o for 1 visit to do PT re-cert.   [phone] ok to leave VM

## 2017-09-16 NOTE — Telephone Encounter (Signed)
Copied from [place] [phone]. Topic: General - Other >> Sep 16, 2017  2:46 PM Adelene Idler wrote: Juliann Pulse from [facility] is calling in for verbal orders for speech therapy 1 x week for 1 week and 2 x week for 5 weeks  CB# [phone]

## 2017-09-17 ENCOUNTER — Telehealth: Payer: Self-pay | Admitting: Family Medicine

## 2017-09-17 NOTE — Telephone Encounter (Signed)
Spoke to Nurse at [facility] at home and gave the verbal order for the PT, speech and social work.   Thanks, [employer]

## 2017-09-17 NOTE — Telephone Encounter (Signed)
Copied from [place] [phone]. Topic: General - Other >> Sep 17, 2017 11:55 AM Valla Leaver wrote: Reason for CRM: Webb Silversmith, SW with [facility] calling to see if [facility] faxed on 08/09 was received or not?

## 2017-09-19 ENCOUNTER — Encounter: Payer: Self-pay | Admitting: Radiology

## 2017-09-19 NOTE — Telephone Encounter (Signed)
Jeffery Young can you check to see if FL2 has been received and have Dr Carlota Raspberry sign as they are inquiring if it was received. Dgaddy, CMA

## 2017-09-19 NOTE — Telephone Encounter (Signed)
Please advise. Dgaddy, CMA 

## 2017-09-20 NOTE — Telephone Encounter (Signed)
Order given by VM.

## 2017-09-20 NOTE — Telephone Encounter (Signed)
Form completed, placed in fax box,

## 2017-09-20 NOTE — Telephone Encounter (Signed)
Dr. Carlota Raspberry, have you seen these forms?

## 2017-09-20 NOTE — Telephone Encounter (Signed)
I have not seen these forms. I will be in the look out for them.

## 2017-09-26 ENCOUNTER — Telehealth: Payer: Self-pay | Admitting: Family Medicine

## 2017-09-26 NOTE — Telephone Encounter (Signed)
Copied from [place] [phone]. Topic: Quick Communication - See Telephone Encounter >> Sep 26, 2017 10:37 AM Bea Graff, NT wrote: CRM for notification. See Telephone encounter for: 09/26/17. Catalina Lunger with [facility] calling and states she has faxed over a FL2 form twice and was wanting to know the status on receiving this form back to get the pt placed in a nursing home. CB#: [phone] Fax#: [phone]

## 2017-09-30 NOTE — Telephone Encounter (Signed)
This was completed Thursday of last week.  Please check to make sure it was received.

## 2017-10-01 NOTE — Telephone Encounter (Signed)
Attempted to call social worker and there was no answer. Left a message to call back  Thanks, Guadelupe Sabin

## 2017-10-03 ENCOUNTER — Ambulatory Visit (INDEPENDENT_AMBULATORY_CARE_PROVIDER_SITE_OTHER): Payer: BC Managed Care – PPO | Admitting: Family Medicine

## 2017-10-03 ENCOUNTER — Other Ambulatory Visit: Payer: Self-pay

## 2017-10-03 ENCOUNTER — Ambulatory Visit (INDEPENDENT_AMBULATORY_CARE_PROVIDER_SITE_OTHER): Payer: BC Managed Care – PPO

## 2017-10-03 ENCOUNTER — Encounter: Payer: Self-pay | Admitting: Family Medicine

## 2017-10-03 VITALS — BP 127/67 | HR 63 | Temp 98.4°F | Ht 65.0 in | Wt 118.6 lb

## 2017-10-03 DIAGNOSIS — N186 End stage renal disease: Secondary | ICD-10-CM

## 2017-10-03 DIAGNOSIS — Z794 Long term (current) use of insulin: Secondary | ICD-10-CM

## 2017-10-03 DIAGNOSIS — Z992 Dependence on renal dialysis: Secondary | ICD-10-CM

## 2017-10-03 DIAGNOSIS — R059 Cough, unspecified: Secondary | ICD-10-CM

## 2017-10-03 DIAGNOSIS — R05 Cough: Secondary | ICD-10-CM

## 2017-10-03 DIAGNOSIS — M21372 Foot drop, left foot: Secondary | ICD-10-CM | POA: Diagnosis not present

## 2017-10-03 DIAGNOSIS — E1122 Type 2 diabetes mellitus with diabetic chronic kidney disease: Secondary | ICD-10-CM | POA: Diagnosis not present

## 2017-10-03 DIAGNOSIS — I639 Cerebral infarction, unspecified: Secondary | ICD-10-CM

## 2017-10-03 LAB — LIPID PANEL
CHOL/HDL RATIO: 3.5 ratio (ref 0.0–5.0)
Cholesterol, Total: 146 mg/dL (ref 100–199)
HDL: 42 mg/dL (ref >39)
LDL Calculated: 59 mg/dL (ref 0–99)
Triglycerides: 223 mg/dL — ABNORMAL HIGH (ref 0–149)
VLDL Cholesterol Cal: 45 mg/dL — ABNORMAL HIGH (ref 5–40)

## 2017-10-03 MED ORDER — PEN NEEDLES 32G X 4 MM MISC
1.0000 | Freq: Three times a day (TID) | 3 refills | Status: DC
Start: 2017-10-03 — End: 2018-06-10

## 2017-10-03 MED ORDER — INSULIN ASPART 100 UNIT/ML FLEXPEN
0.0000 [IU] | PEN_INJECTOR | Freq: Three times a day (TID) | SUBCUTANEOUS | 3 refills | Status: DC
Start: 2017-10-03 — End: 2018-06-10

## 2017-10-03 NOTE — Progress Notes (Signed)
Subjective:    Patient ID: Jeffery Young, male    DOB: 1965-03-04, 52 y.o.   MRN: [id number]  HPI Jeffery Young is a 52 y.o. male Presents today for: Chief Complaint  Patient presents with  . Follow-up    diabetes, needing refills on insulin and needles   History of multiple medical problems, see last evaluation August 1.   Cough: Reported long-standing history of cough, without recent changes when discussed in August.  He had a chest x-ray one view in May without apparent airspace disease pneumothorax or pleural effusion.  Also had a reassuring two-view chest x-ray in April of this year.  Former tobacco use.   Cough same for years - dry cough usually, no blood. Quit smoking earlier this year. No fever. No heartburn. No congestion or allergy symptoms. Feels like smokers' cough. No treatments. No meds needed as only a few times. No pulmonary eval known. No wheezing/dyspnea.   Diabetes: Variable readings discussed previously.  Insulin-dependent diabetic.  Was referred to endocrinology.  Some of variable readings are thought to be due to diet.  He does use sliding scale insulin.  Overall control was good based on last A1c.  Appears to have been referred to Dr. Jeanann Lewandowsky. Lipid panel and urine microalbumin to be ordered today. optho referral placed today. On sliding scale novolog with meals, 7 units lantus at bedtime. Morning readings running low - 63, 69, 60, 65. Lunchtime - 60-80's before eating. Evening 84-192. Eating 3 meals. Still using sliding scale with meals - unknown total dose.   Lab Results  Component Value Date   HGBA1C 6.8 (H) 09/05/2017   History of CVA with residual deficit. See previous notes.  Decision was made for assisted living placement, FL2 form was filled out for [facility].  Was referred to orthopedics to discuss foot drop and possible bracing, as well as previous referral and treatment by physical therapy.  Per notes, he declined meeting with ortho. He was encouraged  to use the walker for assistance including going to the restroom.  Additionally was referred to neurology as had not had recent follow-up.  Has continued to take Plavix and aspirin. Appointment scheduled September 5 with [facility], September 11 with neurology, Dr. Leonie Man.   Hypertension: Hypertension: BP Readings from Last 3 Encounters:  10/03/17 127/67  09/05/17 (!) 101/56  08/22/17 (!) 90/50   Lab Results  Component Value Date   CREATININE 6.16 (H) 07/04/2017   Borderline low last visit, improved today.  He is followed by nephrology with chronic kidney disease on dialysis 3 times per week.  HM: Referring to optho. Refused colonoscopy or Cologuard initially, then agreed to cologuard.   Patient Active Problem List   Diagnosis Date Noted  . ESRD (end stage renal disease) on dialysis ([place]) 06/25/2017  . Uncontrolled hypertension 05/28/2017  . Depression 05/28/2017  . Normocytic anemia 05/28/2017  . History of completed stroke 05/28/2017  . Syncope 03/13/2016  . Ischemic cardiomyopathy 03/13/2016  . Unstable angina pectoris ([place]) 01/11/2016  . Hypertensive heart disease with heart failure ([place])   . Chronic combined systolic and diastolic heart failure ([place])   . Elevated troponin   . Chest pain 01/10/2016  . Coronary artery disease due to lipid rich plaque 01/10/2016  . Status post coronary artery stent placement   . Type 2 diabetes mellitus with diabetic nephropathy, without long-term current use of insulin ([place])   . Tobacco abuse   . Essential hypertension   . Hypercholesteremia   .  NSTEMI (non-ST elevated myocardial infarction) ([place]) 12/22/2015  . Lightheadedness 08/22/2012  . Dehydration 08/22/2012   Past Medical History:  Diagnosis Date  . Chest pain 01/10/2016  . Chronic combined systolic and diastolic heart failure ([place])   . Coronary artery disease    a.12/22/15: NSTEMI s/p overlapping DES x2 and balloon angioplasty to distal AV groove Circumflex (too small for a  stent).   . Coronary artery disease due to lipid rich plaque 01/10/2016  . Dehydration 08/22/2012  . Depression   . Elevated troponin   . ESRD (end stage renal disease) on dialysis ([place])   . Essential hypertension   . History of completed stroke 05/28/2017  . Hypercholesteremia   . Hypertension   . Hypertensive heart disease with heart failure ([place])   . Ischemic cardiomyopathy 03/13/2016  . Normocytic anemia 05/28/2017  . NSTEMI (non-ST elevated myocardial infarction) ([place]) 12/22/2015  . Prostate infection   . Status post coronary artery stent placement   . Syncope 03/13/2016  . Tobacco abuse   . Type 2 diabetes mellitus with diabetic nephropathy, without long-term current use of insulin ([place])   . Type II diabetes mellitus ([place])   . Uncontrolled hypertension 05/28/2017  . Unstable angina pectoris ([place]) 01/11/2016   Past Surgical History:  Procedure Laterality Date  . BASCILIC VEIN TRANSPOSITION Left 06/28/2017   Procedure: LEFT UPPER ARM ARTERIOVENOUS GORTEX-GRAFT PLACEMENT;  Surgeon: Angelia Mould, MD;  Location: [facility];  Service: Vascular;  Laterality: Left;  . CARDIAC CATHETERIZATION N/A 12/22/2015   Procedure: Left Heart Cath and Coronary Angiography;  Surgeon: Burnell Blanks, MD;  Location: [place] [facility];  Service: Cardiovascular;  Laterality: N/A;  . CARDIAC CATHETERIZATION N/A 12/22/2015   Procedure: Coronary Stent Intervention;  Surgeon: Burnell Blanks, MD;  Location: [place] [facility];  Service: Cardiovascular;  Laterality: N/A;  . CARDIAC CATHETERIZATION N/A 01/11/2016   Procedure: Left Heart Cath and Coronary Angiography;  Surgeon: Sherren Mocha, MD;  Location: [place] [facility];  Service: Cardiovascular;  Laterality: N/A;  . CARDIAC CATHETERIZATION N/A 01/11/2016   Procedure: Intravascular Pressure Wire/FFR Study;  Surgeon: Sherren Mocha, MD;  Location: [place] [facility];  Service: Cardiovascular;  Laterality: N/A;  . CARDIAC CATHETERIZATION N/A  01/11/2016   Procedure: Coronary Stent Intervention;  Surgeon: Sherren Mocha, MD;  Location: [place] [facility];  Service: Cardiovascular;  Laterality: N/A;  . CORONARY ANGIOPLASTY WITH STENT PLACEMENT  12/22/2015  . INSERTION OF DIALYSIS CATHETER Right 06/28/2017   Procedure: INSERTION OF DIALYSIS CATHETER RIGHT INTERNAL JUGULAR;  Surgeon: Angelia Mould, MD;  Location: [facility];  Service: Vascular;  Laterality: Right;   No Known Allergies Prior to Admission medications   Medication Sig Start Date End Date Taking? Authorizing Provider  aspirin 81 MG chewable tablet Chew 1 tablet (81 mg total) by mouth daily. 08/15/17  Yes [name], MD  atorvastatin (LIPITOR) 10 MG tablet Take 1 tablet (10 mg total) by mouth daily. 08/15/17  Yes [name], MD  calcitRIOL (ROCALTROL) 0.5 MCG capsule Take 1 capsule (0.5 mcg total) by mouth every Monday, Wednesday, and Friday with hemodialysis. 08/16/17  Yes [name], MD  carvedilol (COREG) 6.25 MG tablet Take 1 tablet (6.25 mg total) by mouth 2 (two) times daily with a meal. 08/22/17  Yes [name], MD  clopidogrel (PLAVIX) 75 MG tablet TAKE ONE TABLET BY MOUTH DAILY 04/10/17  Yes Burnell Blanks, MD  docusate sodium (COLACE) 100 MG capsule Take 1 capsule (  100 mg total) by mouth 2 (two) times daily. 08/15/17  Yes [name], MD  insulin aspart (NOVOLOG) 100 UNIT/ML FlexPen Inject 0-10 Units into the skin 3 (three) times daily before meals. Sliding Scale: 0-150: 0u; 151-200: 2u; 201-250: 4u; 251-300: 6u; 301-350: 8u; 351-400:10u. 401+ Give 10u and call MD   Yes [provider]  insulin glargine (LANTUS) 100 UNIT/ML injection Inject 0.07 mLs (7 Units total) into the skin at bedtime. 07/04/17  Yes Bonnielee Haff, MD  multivitamin (RENA-VIT) TABS tablet Take 1 tablet by mouth at bedtime. 08/15/17  Yes [name], MD  nitroGLYCERIN (NITROSTAT) 0.4 MG SL tablet Place 1 tablet (0.4 mg total) under the tongue every  5 (five) minutes x 3 doses as needed for chest pain. 12/26/15  Yes Cheryln Manly, NP  Nutritional Supplements (FEEDING SUPPLEMENT, NEPRO CARB STEADY,) LIQD Take 237 mLs by mouth 3 (three) times daily as needed (Supplement). 07/04/17  Yes Bonnielee Haff, MD  ONE [facility] LANCETS MISC One Touch [facility] 16X.  Please provide Verio test strips.  To test four times daily. 08/30/17  Yes [name], MD  sertraline (ZOLOFT) 50 MG tablet Take 1 tablet (50 mg total) by mouth daily. 08/15/17  Yes [name], MD  sevelamer carbonate (RENVELA) 800 MG tablet Take 1 tablet (800 mg total) by mouth 3 (three) times daily with meals. 08/15/17  Yes [name], MD  traZODone (DESYREL) 50 MG tablet Take 1 tablet (50 mg total) by mouth at bedtime. 08/15/17  Yes [name], MD   Social History   Socioeconomic History  . Marital status: Married    Spouse name: Not on file  . Number of children: Not on file  . Years of education: Not on file  . Highest education level: Not on file  Occupational History  . Occupation: International aid/development worker: [employer]  Social Needs  . Financial resource strain: Not on file  . Food insecurity:    Worry: Not on file    Inability: Not on file  . Transportation needs:    Medical: Not on file    Non-medical: Not on file  Tobacco Use  . Smoking status: Former Smoker    Packs/day: 0.75    Years: 29.00    Pack years: 21.75    Types: Cigarettes    Last attempt to quit: 05/28/2017    Years since quitting: 0.3  . Smokeless tobacco: Never Used  Substance and Sexual Activity  . Alcohol use: Not Currently    Alcohol/week: 7.0 standard drinks    Types: 7 Cans of beer per week    Frequency: Never    Comment: last drink was prior to last hospitalization  . Drug use: No  . Sexual activity: Not Currently  Lifestyle  . Physical activity:    Days per week: Not on file    Minutes per session: Not on file  . Stress: Not on file  Relationships  .  Social connections:    Talks on phone: Not on file    Gets together: Not on file    Attends religious service: Not on file    Active member of club or organization: Not on file    Attends meetings of clubs or organizations: Not on file    Relationship status: Not on file  . Intimate partner violence:    Fear of current or ex partner: Not on file    Emotionally abused: Not  on file    Physically abused: Not on file    Forced sexual activity: Not on file  Other Topics Concern  . Not on file  Social History Narrative  . Not on file    Review of Systems Per HPI    Objective:   Physical Exam  Constitutional: He is oriented to person, place, and time. He appears well-developed and well-nourished.  HENT:  Head: Normocephalic and atraumatic.  Eyes: Pupils are equal, round, and reactive to light. EOM are normal.  Neck: No JVD present. Carotid bruit is not present.  Cardiovascular: Normal rate, regular rhythm and normal heart sounds.  No murmur heard. Pulmonary/Chest: Effort normal and breath sounds normal. He has no rales.  Clear, normal effort.   Musculoskeletal: He exhibits no edema.  Neurological: He is alert and oriented to person, place, and time.  Decreased dorsiflexion of left foot.   Skin: Skin is warm and dry.  Psychiatric: He has a normal mood and affect.  Vitals reviewed.  Vitals:   10/03/17 1022  BP: 127/67  Pulse: 63  Temp: 98.4 F (36.9 C)  TempSrc: Oral  SpO2: 94%  Weight: 118 lb 9.6 oz (53.8 kg)  Height: 5\' 5"  (1.651 m)     Dg Chest 2 View  Result Date: 10/03/2017 CLINICAL DATA:  Chronic cough, history of tobacco use EXAM: CHEST - 2 VIEW COMPARISON:  Portable chest x-ray of 06/28/2017 FINDINGS: No active infiltrate or effusion is seen. Mediastinal and hilar contours are unremarkable. The heart is within normal limits in size. No acute bony abnormality is seen. IMPRESSION: No active cardiopulmonary disease. Electronically Signed   By: Ivar Drape M.D.   On:  10/03/2017 12:07      Assessment & Plan:    Alferd Obryant is a 52 y.o. male Type 2 diabetes mellitus with chronic kidney disease on chronic dialysis, with long-term current use of insulin ([place]) - Plan: Lipid panel, Ambulatory referral to Ophthalmology, insulin aspart (NOVOLOG) 100 UNIT/ML FlexPen, Insulin Pen Needle (PEN NEEDLES) 32G X 4 MM MISC, CANCELED: Microalbumin/Creatinine Ratio, Urine  - refer to optho  - based on low readings - will stop lantus for now, continue sliding scale and decide on changes based on need of sliding scale in next week. Endocrine eval pending.   Cough - Plan: DG Chest 2 View, Ambulatory referral to Pulmonology  - longstanding. Prior smoker. CXR clear. Pulmonary referral placed.   Left foot drop Cerebrovascular accident (CVA), unspecified mechanism ([place])  - has upcoming neuro appt. Did not follow up with ortho. May need repeat bracing, but continue walker for stability.   - FL2 completed prior for ALF placement - options being pursued.   Meds ordered this encounter  Medications  . insulin aspart (NOVOLOG) 100 UNIT/ML FlexPen    Sig: Inject 0-10 Units into the skin 3 (three) times daily before meals. Sliding Scale: 0-150: 0u; 151-200: 2u; 201-250: 4u; 251-300: 6u; 301-350: 8u; 351-400:10u. 401+ Give 10u and call MD    Dispense:  15 mL    Refill:  3  . Insulin Pen Needle (PEN NEEDLES) 32G X 4 MM MISC    Sig: 1 Dose by Does not apply route 3 (three) times daily. Use with sliding scale novolog with meals.    Dispense:  90 each    Refill:  3   Patient Instructions    I will check a chest xray for cough, but it may be helpful to meet with lung specialist. I will refer you.  Based on home low readings, stop lantus for now. Keep record of amount of sliding scale used over 1 week, and bring that by or send it to me by Mychart to decide on other changes if needed if you have not seen endocrinology.   Keep appointments with cardiology and neurology as planned.    You can discuss foot drop and bracing at neuro appointment, but if needed we can try to get you into orthopaedics.    Cough, Adult A cough helps to clear your throat and lungs. A cough may last only 2-3 weeks (acute), or it may last longer than 8 weeks (chronic). Many different things can cause a cough. A cough may be a sign of an illness or another medical condition. Follow these instructions at home:  Pay attention to any changes in your cough.  Take medicines only as told by your doctor. ? If you were prescribed an antibiotic medicine, take it as told by your doctor. Do not stop taking it even if you start to feel better. ? Talk with your doctor before you try using a cough medicine.  Drink enough fluid to keep your pee (urine) clear or pale yellow.  If the air is dry, use a cold steam vaporizer or humidifier in your home.  Stay away from things that make you cough at work or at home.  If your cough is worse at night, try using extra pillows to raise your head up higher while you sleep.  Do not smoke, and try not to be around smoke. If you need help quitting, ask your doctor.  Do not have caffeine.  Do not drink alcohol.  Rest as needed. Contact a doctor if:  You have new problems (symptoms).  You cough up yellow fluid (pus).  Your cough does not get better after 2-3 weeks, or your cough gets worse.  Medicine does not help your cough and you are not sleeping well.  You have pain that gets worse or pain that is not helped with medicine.  You have a fever.  You are losing weight and you do not know why.  You have night sweats. Get help right away if:  You cough up blood.  You have trouble breathing.  Your heartbeat is very fast. This information is not intended to replace advice given to you by your health care provider. Make sure you discuss any questions you have with your health care provider. Document Released: 10/05/2010 Document Revised: 06/30/2015  Document Reviewed: 03/31/2014 Elsevier Interactive Patient Education  [employer].   If you have lab work done today you will be contacted with your lab results within the next 2 weeks.  If you have not heard from [place] then please contact us. The fastest way to get your results is to register for My Chart.   IF you received an x-ray today, you will receive an invoice from [facility]. Please contact [facility] at [phone] with questions or concerns regarding your invoice.   IF you received labwork today, you will receive an invoice from [place] [facility]. Please contact [facility] at [phone] with questions or concerns regarding your invoice.   Our billing staff will not be able to assist you with questions regarding bills from these companies.  You will be contacted with the lab results as soon as they are available. The fastest way to get your results is to activate your My Chart account. Instructions are located on the last page of this paperwork. If you have  not heard from [place] regarding the results in 2 weeks, please contact this office.      Signed,   Merri Ray, MD [facility] [place].  10/06/17 12:21 PM

## 2017-10-03 NOTE — Telephone Encounter (Signed)
Attempted to call social worker once again no answer. Lowell Guitar  Kittie Plater

## 2017-10-03 NOTE — Patient Instructions (Addendum)
I will check a chest xray for cough, but it may be helpful to meet with lung specialist. I will refer you.    Based on home low readings, stop lantus for now. Keep record of amount of sliding scale used over 1 week, and bring that by or send it to me by Mychart to decide on other changes if needed if you have not seen endocrinology.   Keep appointments with cardiology and neurology as planned.   You can discuss foot drop and bracing at neuro appointment, but if needed we can try to get you into orthopaedics.    Cough, Adult A cough helps to clear your throat and lungs. A cough may last only 2-3 weeks (acute), or it may last longer than 8 weeks (chronic). Many different things can cause a cough. A cough may be a sign of an illness or another medical condition. Follow these instructions at home:  Pay attention to any changes in your cough.  Take medicines only as told by your doctor. ? If you were prescribed an antibiotic medicine, take it as told by your doctor. Do not stop taking it even if you start to feel better. ? Talk with your doctor before you try using a cough medicine.  Drink enough fluid to keep your pee (urine) clear or pale yellow.  If the air is dry, use a cold steam vaporizer or humidifier in your home.  Stay away from things that make you cough at work or at home.  If your cough is worse at night, try using extra pillows to raise your head up higher while you sleep.  Do not smoke, and try not to be around smoke. If you need help quitting, ask your doctor.  Do not have caffeine.  Do not drink alcohol.  Rest as needed. Contact a doctor if:  You have new problems (symptoms).  You cough up yellow fluid (pus).  Your cough does not get better after 2-3 weeks, or your cough gets worse.  Medicine does not help your cough and you are not sleeping well.  You have pain that gets worse or pain that is not helped with medicine.  You have a fever.  You are losing  weight and you do not know why.  You have night sweats. Get help right away if:  You cough up blood.  You have trouble breathing.  Your heartbeat is very fast. This information is not intended to replace advice given to you by your health care provider. Make sure you discuss any questions you have with your health care provider. Document Released: 10/05/2010 Document Revised: 06/30/2015 Document Reviewed: 03/31/2014 Elsevier Interactive Patient Education  [employer].   If you have lab work done today you will be contacted with your lab results within the next 2 weeks.  If you have not heard from [place] then please contact us. The fastest way to get your results is to register for My Chart.   IF you received an x-ray today, you will receive an invoice from [facility]. Please contact [facility] at [phone] with questions or concerns regarding your invoice.   IF you received labwork today, you will receive an invoice from [facility]. Please contact [facility] at [phone] with questions or concerns regarding your invoice.   Our billing staff will not be able to assist you with questions regarding bills from these companies.  You will be contacted with the lab results as soon as they are available. The fastest way  to get your results is to activate your My Chart account. Instructions are located on the last page of this paperwork. If you have not heard from [place] regarding the results in 2 weeks, please contact this office.

## 2017-10-06 ENCOUNTER — Encounter: Payer: Self-pay | Admitting: Family Medicine

## 2017-10-10 ENCOUNTER — Other Ambulatory Visit: Payer: Self-pay | Admitting: Family Medicine

## 2017-10-10 ENCOUNTER — Encounter: Payer: Self-pay | Admitting: Cardiology

## 2017-10-10 ENCOUNTER — Ambulatory Visit: Payer: BC Managed Care – PPO | Admitting: Cardiology

## 2017-10-10 DIAGNOSIS — Z992 Dependence on renal dialysis: Principal | ICD-10-CM

## 2017-10-10 DIAGNOSIS — I10 Essential (primary) hypertension: Secondary | ICD-10-CM

## 2017-10-10 DIAGNOSIS — I2510 Atherosclerotic heart disease of native coronary artery without angina pectoris: Secondary | ICD-10-CM | POA: Diagnosis not present

## 2017-10-10 DIAGNOSIS — N186 End stage renal disease: Secondary | ICD-10-CM

## 2017-10-10 MED ORDER — CARVEDILOL 6.25 MG PO TABS: 6.2500 mg | ORAL_TABLET | Freq: Two times a day (BID) | ORAL | 2 refills | Status: DC

## 2017-10-10 MED ORDER — CLOPIDOGREL BISULFATE 75 MG PO TABS: 75.0000 mg | ORAL_TABLET | Freq: Every day | ORAL | 2 refills | Status: DC

## 2017-10-10 MED ORDER — ATORVASTATIN CALCIUM 10 MG PO TABS: 10.0000 mg | ORAL_TABLET | Freq: Every day | ORAL | 2 refills | Status: DC

## 2017-10-10 NOTE — Telephone Encounter (Signed)
Renvela refill Last refill 08/15/17 #90 refill 1 PCPGreene L[facility]/29/[facility]  Per office note 08/15/17 PCP recommends Nephrology manage this medication. Refused refill at this time

## 2017-10-10 NOTE — Progress Notes (Signed)
10/10/2017 Jeffery Young   Apr 02, 1965  [id number]  Primary Physician [name], MD Primary Cardiologist: Dr. Angelena Form   Reason for Visit/CC: f/u for CAD   HPI:  Jeffery Young is a 52 y.o. male followed by Dr. Angelena Form, with a h/o CAD, HTN, HLD, DM and tobacco use. He first presented Nov 2017 with NSTEMI and had a Promus DES placed in the Circumflex and in December 2017 another DES was placed in the distal Circumflex. He was also noted to have moderate stenosis of the proximal OM branch however FFR testing was normal. EF was initially low post MI at 30%. He was placed on appropriate medical therapy for systolic HF and fitted with a LifeVest for primary prevention of SCD. F/u Echo January 2018 showed improvement in systolic function with DDUK=02-54%. LifeVest discontinued. He has required medication adjustments due to side effects. Brilinta was stopped due to dyspnea. Plavix added. Hydralazine dose also had to be reduced due to orthostatic symptoms.   He presents to clinic today for f/u. Last OV was 11/2016. Since that time, he reports that he has done well from a cardiac standpoint, denying chest pain and dyspnea.  Blood pressure is well controlled today at 136/62.  Recent labs done October 03, 2017 showed controlled LDL at 59 mg/dL.  ALT within normal limits on Lipitor.  Hemoglobin A1c was also controlled at 6.8.  Unfortunately however he does have a new diagnosis of renal failure.  In April of this year, he was admitted for generalized weakness and was diagnosed with multiple TIAs.  He was also noted to be in renal failure with a creatinine in the 5 range.  Nephrology was consulted and unfortunately he had no improvement in renal function and has since been started on hemodialysis.  He goes 3 days a week, Monday Wednesday Friday.  On a positive note, he has quit smoking.    Current Meds  Medication Sig  . aspirin 81 MG chewable tablet Chew 1 tablet (81 mg total) by mouth daily.  Marland Kitchen  atorvastatin (LIPITOR) 10 MG tablet Take 1 tablet (10 mg total) by mouth daily.  . calcitRIOL (ROCALTROL) 0.5 MCG capsule Take 1 capsule (0.5 mcg total) by mouth every Monday, Wednesday, and Friday with hemodialysis.  Marland Kitchen carvedilol (COREG) 6.25 MG tablet Take 1 tablet (6.25 mg total) by mouth 2 (two) times daily with a meal.  . clopidogrel (PLAVIX) 75 MG tablet TAKE ONE TABLET BY MOUTH DAILY  . docusate sodium (COLACE) 100 MG capsule Take 1 capsule (100 mg total) by mouth 2 (two) times daily.  . insulin aspart (NOVOLOG) 100 UNIT/ML FlexPen Inject 0-10 Units into the skin 3 (three) times daily before meals. Sliding Scale: 0-150: 0u; 151-200: 2u; 201-250: 4u; 251-300: 6u; 301-350: 8u; 351-400:10u. 401+ Give 10u and call MD  . Insulin Pen Needle (PEN NEEDLES) 32G X 4 MM MISC 1 Dose by Does not apply route 3 (three) times daily. Use with sliding scale novolog with meals.  . multivitamin (RENA-VIT) TABS tablet Take 1 tablet by mouth at bedtime.  . nitroGLYCERIN (NITROSTAT) 0.4 MG SL tablet Place 1 tablet (0.4 mg total) under the tongue every 5 (five) minutes x 3 doses as needed for chest pain.  . Nutritional Supplements (FEEDING SUPPLEMENT, NEPRO CARB STEADY,) LIQD Take 237 mLs by mouth 3 (three) times daily as needed (Supplement).  . ONE TOUCH LANCETS MISC One Touch Delica Lancet 27C.  Please provide Verio test strips.  To test four times daily.  . sertraline (  ZOLOFT) 50 MG tablet Take 1 tablet (50 mg total) by mouth daily.  . sevelamer carbonate (RENVELA) 800 MG tablet Take 1 tablet (800 mg total) by mouth 3 (three) times daily with meals.  . traZODone (DESYREL) 50 MG tablet Take 1 tablet (50 mg total) by mouth at bedtime.   No Known Allergies Past Medical History:  Diagnosis Date  . Chest pain 01/10/2016  . Chronic combined systolic and diastolic heart failure ([place])   . Coronary artery disease    a.12/22/15: NSTEMI s/p overlapping DES x2 and balloon angioplasty to distal AV groove Circumflex (too  small for a stent).   . Coronary artery disease due to lipid rich plaque 01/10/2016  . Dehydration 08/22/2012  . Depression   . Elevated troponin   . ESRD (end stage renal disease) on dialysis ([place])   . Essential hypertension   . History of completed stroke 05/28/2017  . Hypercholesteremia   . Hypertension   . Hypertensive heart disease with heart failure ([place])   . Ischemic cardiomyopathy 03/13/2016  . Normocytic anemia 05/28/2017  . NSTEMI (non-ST elevated myocardial infarction) ([place]) 12/22/2015  . Prostate infection   . Status post coronary artery stent placement   . Syncope 03/13/2016  . Tobacco abuse   . Type 2 diabetes mellitus with diabetic nephropathy, without long-term current use of insulin ([place])   . Type II diabetes mellitus ([place])   . Uncontrolled hypertension 05/28/2017  . Unstable angina pectoris ([place]) 01/11/2016   Family History  Problem Relation Age of Onset  . CAD Brother   . CVA Brother   . Chronic Renal Failure Brother 50       on HD  . CAD Brother    Past Surgical History:  Procedure Laterality Date  . BASCILIC VEIN TRANSPOSITION Left 06/28/2017   Procedure: LEFT UPPER ARM ARTERIOVENOUS GORTEX-GRAFT PLACEMENT;  Surgeon: Angelia Mould, MD;  Location: [place];  Service: Vascular;  Laterality: Left;  . CARDIAC CATHETERIZATION N/A 12/22/2015   Procedure: Left Heart Cath and Coronary Angiography;  Surgeon: Burnell Blanks, MD;  Location: [place] [facility];  Service: Cardiovascular;  Laterality: N/A;  . CARDIAC CATHETERIZATION N/A 12/22/2015   Procedure: Coronary Stent Intervention;  Surgeon: Burnell Blanks, MD;  Location: [place] [facility];  Service: Cardiovascular;  Laterality: N/A;  . CARDIAC CATHETERIZATION N/A 01/11/2016   Procedure: Left Heart Cath and Coronary Angiography;  Surgeon: Sherren Mocha, MD;  Location: [place] [facility];  Service: Cardiovascular;  Laterality: N/A;  . CARDIAC CATHETERIZATION N/A 01/11/2016   Procedure: Intravascular  Pressure Wire/FFR Study;  Surgeon: Sherren Mocha, MD;  Location: [place] [facility];  Service: Cardiovascular;  Laterality: N/A;  . CARDIAC CATHETERIZATION N/A 01/11/2016   Procedure: Coronary Stent Intervention;  Surgeon: Sherren Mocha, MD;  Location: [place] [facility];  Service: Cardiovascular;  Laterality: N/A;  . CORONARY ANGIOPLASTY WITH STENT PLACEMENT  12/22/2015  . INSERTION OF DIALYSIS CATHETER Right 06/28/2017   Procedure: INSERTION OF DIALYSIS CATHETER RIGHT INTERNAL JUGULAR;  Surgeon: Angelia Mould, MD;  Location[facility] OR;  Service: Vascular;  Laterality: Right;   Social History   Socioeconomic History  . Marital status: Married    Spouse name: Not on file  . Number of children: Not on file  . Years of education: Not on file  . Highest education level: Not on file  Occupational History  . Occupation: International aid/development worker: [employer]  Social Needs  . Financial resource strain:  Not on file  . Food insecurity:    Worry: Not on file    Inability: Not on file  . Transportation needs:    Medical: Not on file    Non-medical: Not on file  Tobacco Use  . Smoking status: Former Smoker    Packs/day: 0.75    Years: 29.00    Pack years: 21.75    Types: Cigarettes    Last attempt to quit: 05/28/2017    Years since quitting: 0.3  . Smokeless tobacco: Never Used  Substance and Sexual Activity  . Alcohol use: Not Currently    Alcohol/week: 7.0 standard drinks    Types: 7 Cans of beer per week    Frequency: Never    Comment: last drink was prior to last hospitalization  . Drug use: No  . Sexual activity: Not Currently  Lifestyle  . Physical activity:    Days per week: Not on file    Minutes per session: Not on file  . Stress: Not on file  Relationships  . Social connections:    Talks on phone: Not on file    Gets together: Not on file    Attends religious service: Not on file    Active member of club or organization: Not on file    Attends meetings of  clubs or organizations: Not on file    Relationship status: Not on file  . Intimate partner violence:    Fear of current or ex partner: Not on file    Emotionally abused: Not on file    Physically abused: Not on file    Forced sexual activity: Not on file  Other Topics Concern  . Not on file  Social History Narrative  . Not on file     Review of Systems: General: negative for chills, fever, night sweats or weight changes.  Cardiovascular: negative for chest pain, dyspnea on exertion, edema, orthopnea, palpitations, paroxysmal nocturnal dyspnea or shortness of breath Dermatological: negative for rash Respiratory: negative for cough or wheezing Urologic: negative for hematuria Abdominal: negative for nausea, vomiting, diarrhea, bright red blood per rectum, melena, or hematemesis Neurologic: negative for visual changes, syncope, or dizziness All other systems reviewed and are otherwise negative except as noted above.   Physical Exam:  Blood pressure 136/62, pulse 63, height 5\' 5"  (1.651 m), weight 120 lb 12.8 oz (54.8 kg).  General appearance: alert, cooperative and no distress Neck: no carotid bruit and no JVD Lungs: clear to auscultation bilaterally Heart: regular rate and rhythm, S1, S2 normal, no murmur, click, rub or gallop Extremities: extremities normal, atraumatic, no cyanosis or edema Pulses: 2+ and symmetric Skin: Skin color, texture, turgor normal. No rashes or lesions Neurologic: Grossly normal  EKG not performed -- personally reviewed   ASSESSMENT AND PLAN:   1. CAD: history outlined in HPI above.  History of multiple MIs treated with coronary stenting.  He is stable without any anginal symptomatology including no chest pain or dyspnea.  Continue medical therapy with aspirin, Plavix, beta-blocker and statin.  2. H/o Ischemic CM: EF post MI in 2017 was 30% but improved to normal w/ medical management. F/u Echo 02/2016 showed normal LVEF. He denies dyspnea. Volume is  stable and controlled through hemodialysis.  3. ESRD: He has started hemodialysis, MWF.   4. TIAs: History of multiple TIAs.  Patient was made aware of this diagnosis in April 2019 when admitted for renal failure and uremia.  No prior documented history of atrial fibrillation or flutter.  He denies any palpitations.  Regular rate and rhythm on exam today.  He has been continued on aspirin and Plavix.  I have recommended a 30-day monitor to rule out silent atrial arrhythmias.  5. HTN: Controlled on current regimen.  Blood pressure is 136/62 today.  No changes made.  6. HLD: Controlled on statin therapy with Lipitor 10 mg.  Recent fasting lipid panel August 2019 showed controlled LDL at 59 mg/dL.  ALT WNL. Goal LDL in the setting of CAD is less than 70.  Continue current regimen.  7. DM: Well controlled.  Recent hemoglobin A1c August 2019 was 6.8  8. H/o Tobacco Use: He quit smoking in April 2019.  He was congratulated on this achievement and was encouraged to continue to refrain from further use.   Follow-Up in 6 months w/ Dr. Angelena Form. We will have him f/u sooner if 30 day monitor shows any atrial fib or flutter.   Brittainy Ladoris Gene, MHS [facility] 10/10/2017 11:07 AM

## 2017-10-10 NOTE — Patient Instructions (Signed)
Medication Instructions:  Your physician recommends that you continue on your current medications as directed. Please refer to the Current Medication list given to you today. Lipitor, Coreg, and Plavix all filled to # 90 day supply to patient's requested pharmacy.   Labwork: -None  Testing/Procedures: Your physician has recommended that you wear an event monitor. Event monitors are medical devices that record the heart's electrical activity. Doctors most often [place] these monitors to diagnose arrhythmias. Arrhythmias are problems with the speed or rhythm of the heartbeat. The monitor is a small, portable device. You can wear one while you do your normal daily activities. This is usually used to diagnose what is causing palpitations/syncope (passing out).    Follow-Up: Your physician wants you to follow-up in: 6 months with Dr.McAlhany.  You will receive a reminder letter in the mail two months in advance. If you don't receive a letter, please call our office to schedule the follow-up appointment.   Any Other Special Instructions Will Be Listed Below (If Applicable).     If you need a refill on your cardiac medications before your next appointment, please call your pharmacy.

## 2017-10-15 ENCOUNTER — Ambulatory Visit: Payer: BC Managed Care – PPO | Admitting: Neurology

## 2017-10-15 ENCOUNTER — Telehealth: Payer: Self-pay | Admitting: Neurology

## 2017-10-15 ENCOUNTER — Encounter: Payer: Self-pay | Admitting: Neurology

## 2017-10-15 VITALS — BP 94/59 | HR 76 | Ht 65.0 in | Wt 118.0 lb

## 2017-10-15 DIAGNOSIS — I6381 Other cerebral infarction due to occlusion or stenosis of small artery: Secondary | ICD-10-CM

## 2017-10-15 NOTE — Telephone Encounter (Signed)
Pt is scheduled for 10/29/17 at [facility].

## 2017-10-15 NOTE — Progress Notes (Signed)
[facility] [address] [place]. [place] [address] [phone]       OFFICE CONSULT NOTE  [name] Date of Birth:  12/06/1965 Medical Record Number:  [id number]   Referring MD:  Jamison Neighbor Reason for Referral:  Stroke  HPI: [name] is a 5 year African-American male seen today for initial office consultation visit for stroke. He is accompanied by his son. History is obtained from them and review of provided medical records and have personally reviewed imaging films in PACS.the patient is a poor historian and does not remember great details. His son and chips in. About 3 months ago the patient noticed slurred speech, gait and balance difficulties. He is not entirely sure how many days later but eventually had a CT scan of the head on 07/04/17 which I personally reviewed shows age  indeterminate small right pontine and bilateral basal ganglia lacunar infarcts with mild degree of generalized cerebral atrophy. The patient states he used to smoke 1 pack per day for 25 years and has quit since then. He has history of coronary artery disease and stents and has been taking aspirin and Plavix regularly. The patient has had some trouble with balance and has just finished outpatient physical outpatient therapy. He has started using a walker for last 2 months. States is careful to his left leg does track. He is had no falls or injuries fortunately. Patient had lab work done on 10/03/17 which showed LDL cholesterol of 59 mg percent. Hemoglobin A1c was 6.8 on 09/05/17. He has not had an MRI,echocardiogram or vascular imaging studies done. He denies any known prior history of strokes or TIAs.  ROS:   14 system review of systems is positive for  Gait difficulty, leg weakness, leg involuntary movements, speech difficulty and all other systems negative  PMH:  Past Medical History:  Diagnosis Date  . Chest pain 01/10/2016  . Chronic combined systolic and diastolic heart failure ([place])    . Coronary artery disease    a.12/22/15: NSTEMI s/p overlapping DES x2 and balloon angioplasty to distal AV groove Circumflex (too small for a stent).   . Coronary artery disease due to lipid rich plaque 01/10/2016  . Dehydration 08/22/2012  . Depression   . Elevated troponin   . ESRD (end stage renal disease) on dialysis ([name])   . Essential hypertension   . History of completed stroke 05/28/2017  . Hypercholesteremia   . Hypertension   . Hypertensive heart disease with heart failure ([place])   . Ischemic cardiomyopathy 03/13/2016  . Normocytic anemia 05/28/2017  . NSTEMI (non-ST elevated myocardial infarction) ([place]) 12/22/2015  . Prostate infection   . Status post coronary artery stent placement   . Syncope 03/13/2016  . Tobacco abuse   . Type 2 diabetes mellitus with diabetic nephropathy, without long-term current use of insulin ([place])   . Type II diabetes mellitus ([place])   . Uncontrolled hypertension 05/28/2017  . Unstable angina pectoris ([place]) 01/11/2016    Social History:  Social History   Socioeconomic History  . Marital status: Married    Spouse name: Not on file  . Number of children: Not on file  . Years of education: Not on file  . Highest education level: Not on file  Occupational History  . Occupation: International aid/development worker: [employer]  Social Needs  . Financial resource strain: Not on file  . Food insecurity:    Worry: Not on file    Inability:  Not on file  . Transportation needs:    Medical: Not on file    Non-medical: Not on file  Tobacco Use  . Smoking status: Former Smoker    Packs/day: 0.75    Years: 29.00    Pack years: 21.75    Types: Cigarettes    Last attempt to quit: 05/28/2017    Years since quitting: 0.3  . Smokeless tobacco: Never Used  Substance and Sexual Activity  . Alcohol use: Not Currently    Alcohol/week: 7.0 standard drinks    Types: 7 Cans of beer per week    Frequency: Never    Comment: last drink was prior to last  hospitalization  . Drug use: No  . Sexual activity: Not Currently  Lifestyle  . Physical activity:    Days per week: Not on file    Minutes per session: Not on file  . Stress: Not on file  Relationships  . Social connections:    Talks on phone: Not on file    Gets together: Not on file    Attends religious service: Not on file    Active member of club or organization: Not on file    Attends meetings of clubs or organizations: Not on file    Relationship status: Not on file  . Intimate partner violence:    Fear of current or ex partner: Not on file    Emotionally abused: Not on file    Physically abused: Not on file    Forced sexual activity: Not on file  Other Topics Concern  . Not on file  Social History Narrative  . Not on file    Medications:   Current Outpatient Medications on File Prior to Visit  Medication Sig Dispense Refill  . aspirin 81 MG chewable tablet Chew 1 tablet (81 mg total) by mouth daily. 90 tablet 1  . atorvastatin (LIPITOR) 10 MG tablet Take 1 tablet (10 mg total) by mouth daily. 90 tablet 2  . calcitRIOL (ROCALTROL) 0.5 MCG capsule Take 1 capsule (0.5 mcg total) by mouth every Monday, Wednesday, and Friday with hemodialysis. 30 capsule 1  . carvedilol (COREG) 6.25 MG tablet Take 1 tablet (6.25 mg total) by mouth 2 (two) times daily with a meal. 180 tablet 2  . clopidogrel (PLAVIX) 75 MG tablet Take 1 tablet (75 mg total) by mouth daily. 90 tablet 2  . docusate sodium (COLACE) 100 MG capsule Take 1 capsule (100 mg total) by mouth 2 (two) times daily. 60 capsule 1  . insulin aspart (NOVOLOG) 100 UNIT/ML FlexPen Inject 0-10 Units into the skin 3 (three) times daily before meals. Sliding Scale: 0-150: 0u; 151-200: 2u; 201-250: 4u; 251-300: 6u; 301-350: 8u; 351-400:10u. 401+ Give 10u and call MD 15 mL 3  . Insulin Pen Needle (PEN NEEDLES) 32G X 4 MM MISC 1 Dose by Does not apply route 3 (three) times daily. Use with sliding scale novolog with meals. 90 each 3  .  multivitamin (RENA-VIT) TABS tablet Take 1 tablet by mouth at bedtime. 90 tablet 1  . nitroGLYCERIN (NITROSTAT) 0.4 MG SL tablet Place 1 tablet (0.4 mg total) under the tongue every 5 (five) minutes x 3 doses as needed for chest pain. 25 tablet 3  . Nutritional Supplements (FEEDING SUPPLEMENT, NEPRO CARB STEADY,) LIQD Take 237 mLs by mouth 3 (three) times daily as needed (Supplement).  0  . ONE TOUCH LANCETS MISC One Touch Delica Lancet 89Q.  Please provide Verio test strips.  To test four  times daily. 100 each 3  . sertraline (ZOLOFT) 50 MG tablet Take 1 tablet (50 mg total) by mouth daily. 90 tablet 1  . sevelamer carbonate (RENVELA) 800 MG tablet Take 1 tablet (800 mg total) by mouth 3 (three) times daily with meals. 90 tablet 1  . traZODone (DESYREL) 50 MG tablet Take 1 tablet (50 mg total) by mouth at bedtime. 90 tablet 1   No current facility-administered medications on file prior to visit.     Allergies:  No Known Allergies  Physical Exam General: well developed, well nourished, seated, in no evident distress Head: head normocephalic and atraumatic.   Neck: supple with no carotid or supraclavicular bruits Cardiovascular: regular rate and rhythm, no murmurs Musculoskeletal: no deformity Skin:  no rash/petichiae Vascular:  Normal pulses all extremities  Neurologic Exam Mental Status: Awake and fully alert. Oriented to place and time. Recent and remote memory intact. Attention span, concentration and fund of knowledge appropriate. Mood and affect appropriate.  Cranial Nerves: Fundoscopic exam reveals sharp disc margins on right and left disc not visualized. Pupils equal, briskly reactive to light on the right and sluggish on the left. Left eye vision activities limited only to light perception.. Extraocular movements full without nystagmus but has exotropia of the left eye. Visual fields full to confrontation in right eye only Hearing intact. Facial sensation intact. Face, tongue, palate  moves normally and symmetrically.  Motor: mild left hemiparesis 4/5 strength with weakness of left grip and intrinsic hand muscles. Orbits right over left Body. Mild left upper extremity drift. Left grip is weak. 5 weakness of left hip flexors and ankle dorsiflexors. Tone is increased on the left compared to the right.No clonus noted Sensory.: intact to touch , pinprick , position and vibratory sensation.  Coordination: Rapid alternating movements normal in all extremities. Finger-to-nose and heel-to-shin performed accurately bilaterally but slower on the left side. Gait and Station: Arises from chair without difficulty. Stance is slightly broad-based. Gait demonstrates dragging of the left leg with slight circumduction. Unable to walk tandem Reflexes: 2+ and asymmetric And brisker on the left. Toes downgoing.   NIHSS  3 Modified [place]  3   ASSESSMENT: 52 year old male with slurred speech, gait difficulty and left-sided weakness from lacunar brainstem and  Bilateral subcortical infarcts likely from May 2019 due to small vessel disease. Vascular risk factors of hypertension, diabetes, hyperlipidemia , coronary artery disease,and smoking     PLAN: I had a long d/w patient and his son about his recent lacunar strokes, risk for recurrent stroke/TIAs, personally independently reviewed imaging studies and stroke evaluation results and answered questions.Continue  Aspirin and Plavix given history of cardiac stents  for secondary stroke prevention and maintain strict control of hypertension with blood pressure goal below 130/90, diabetes with hemoglobin A1c goal below 6.5% and lipids with LDL cholesterol goal below 70 mg/dL. I also advised the patient to eat a healthy diet with plenty of whole grains, cereals, fruits and vegetables, exercise regularly and maintain ideal body weight Provider, please review the patient's drug allergy history. Some issues need clarification. MRI scan of the brain, MRA of the  brain and neck without contrast and echocardiogram for stroke risk stratification. He was encouraged to use a walker at all times and we talked about fall and safety precautions as well. Greater than 50% time during this 14 family consultation visit was spent on counseling and coordination of care about his the colon infarcts in talking about stroke prevention and treatment.Followup in the future  with me in 2 months or call earlier if necessary Antony Contras, MD  [facility] [address] [place], [place] [address]  Phone [phone] Fax [phone] Note: This document was prepared with digital dictation and possible smart phrase technology. Any transcriptional errors that result from this process are unintentional.

## 2017-10-15 NOTE — Patient Instructions (Signed)
I had a long d/w patient and his son about his recent lacunar strokes, risk for recurrent stroke/TIAs, personally independently reviewed imaging studies and stroke evaluation results and answered questions.Continue  Aspirin and Plavix given history of cardiac stents  for secondary stroke prevention and maintain strict control of hypertension with blood pressure goal below 130/90, diabetes with hemoglobin A1c goal below 6.5% and lipids with LDL cholesterol goal below 70 mg/dL. I also advised the patient to eat a healthy diet with plenty of whole grains, cereals, fruits and vegetables, exercise regularly and maintain ideal body weight Provider, please review the patient's drug allergy history. Some issues need clarification. MRI scan of the brain, MRA of the brain and neck without contrast and echocardiogram for stroke risk stratification. He was encouraged to use a walker at all times and we talked about fall and safety precautions as well.Followup in the future with me in 2 months or call earlier if necessary  Fall Prevention in the Home Falls can cause injuries. They can happen to people of all ages. There are many things you can do to make your home safe and to help prevent falls. What can I do on the outside of my home?  Regularly fix the edges of walkways and driveways and fix any cracks.  Remove anything that might make you trip as you walk through a door, such as a raised step or threshold.  Trim any bushes or trees on the path to your home.  Use bright outdoor lighting.  Clear any walking paths of anything that might make someone trip, such as rocks or tools.  Regularly check to see if handrails are loose or broken. Make sure that both sides of any steps have handrails.  Any raised decks and porches should have guardrails on the edges.  Have any leaves, snow, or ice cleared regularly.  Use sand or salt on walking paths during winter.  Clean up any spills in your garage right away. This  includes oil or grease spills. What can I do in the bathroom?  Use night lights.  Install grab bars by the toilet and in the tub and shower. Do not use towel bars as grab bars.  Use non-skid mats or decals in the tub or shower.  If you need to sit down in the shower, use a plastic, non-slip stool.  Keep the floor dry. Clean up any water that spills on the floor as soon as it happens.  Remove soap buildup in the tub or shower regularly.  Attach bath mats securely with double-sided non-slip rug tape.  Do not have throw rugs and other things on the floor that can make you trip. What can I do in the bedroom?  Use night lights.  Make sure that you have a light by your bed that is easy to reach.  Do not use any sheets or blankets that are too big for your bed. They should not hang down onto the floor.  Have a firm chair that has side arms. You can use this for support while you get dressed.  Do not have throw rugs and other things on the floor that can make you trip. What can I do in the kitchen?  Clean up any spills right away.  Avoid walking on wet floors.  Keep items that you use a lot in easy-to-reach places.  If you need to reach something above you, use a strong step stool that has a grab bar.  Keep electrical cords out of  the way.  Do not use floor polish or wax that makes floors slippery. If you must use wax, use non-skid floor wax.  Do not have throw rugs and other things on the floor that can make you trip. What can I do with my stairs?  Do not leave any items on the stairs.  Make sure that there are handrails on both sides of the stairs and use them. Fix handrails that are broken or loose. Make sure that handrails are as long as the stairways.  Check any carpeting to make sure that it is firmly attached to the stairs. Fix any carpet that is loose or worn.  Avoid having throw rugs at the top or bottom of the stairs. If you do have throw rugs, attach them to the  floor with carpet tape.  Make sure that you have a light switch at the top of the stairs and the bottom of the stairs. If you do not have them, ask someone to add them for you. What else can I do to help prevent falls?  Wear shoes that: ? Do not have high heels. ? Have rubber bottoms. ? Are comfortable and fit you well. ? Are closed at the toe. Do not wear sandals.  If you use a stepladder: ? Make sure that it is fully opened. Do not climb a closed stepladder. ? Make sure that both sides of the stepladder are locked into place. ? Ask someone to hold it for you, if possible.  Clearly mark and make sure that you can see: ? Any grab bars or handrails. ? First and last steps. ? Where the edge of each step is.  Use tools that help you move around (mobility aids) if they are needed. These include: ? Canes. ? Walkers. ? Scooters. ? Crutches.  Turn on the lights when you go into a dark area. Replace any light bulbs as soon as they burn out.  Set up your furniture so you have a clear path. Avoid moving your furniture around.  If any of your floors are uneven, fix them.  If there are any pets around you, be aware of where they are.  Review your medicines with your doctor. Some medicines can make you feel dizzy. This can increase your chance of falling. Ask your doctor what other things that you can do to help prevent falls. This information is not intended to replace advice given to you by your health care provider. Make sure you discuss any questions you have with your health care provider. Document Released: 11/18/2008 Document Revised: 06/30/2015 Document Reviewed: 02/26/2014 Elsevier Interactive Patient Education  2018 [employer].   Stroke Prevention Some medical conditions and behaviors are associated with a higher chance of having a stroke. You can help prevent a stroke by making nutrition, lifestyle, and other changes, including managing any medical conditions you may  have. What nutrition changes can be made?  Eat healthy foods. You can do this by: ? Choosing foods high in fiber, such as fresh fruits and vegetables and whole grains. ? Eating at least 5 or more servings of fruits and vegetables a day. Try to fill half of your plate at each meal with fruits and vegetables. ? Choosing lean protein foods, such as lean cuts of meat, poultry without skin, fish, tofu, beans, and nuts. ? Eating low-fat dairy products. ? Avoiding foods that are high in salt (sodium). This can help lower blood pressure. ? Avoiding foods that have saturated fat, trans fat,  and cholesterol. This can help prevent high cholesterol. ? Avoiding processed and premade foods.  Follow your health care provider's specific guidelines for losing weight, controlling high blood pressure (hypertension), lowering high cholesterol, and managing diabetes. These may include: ? Reducing your daily calorie intake. ? Limiting your daily sodium intake to 1,500 milligrams (mg). ? Using only healthy fats for cooking, such as olive oil, canola oil, or sunflower oil. ? Counting your daily carbohydrate intake. What lifestyle changes can be made?  Maintain a healthy weight. Talk to your health care provider about your ideal weight.  Get at least 30 minutes of moderate physical activity at least 5 days a week. Moderate activity includes brisk walking, biking, and swimming.  Do not use any products that contain nicotine or tobacco, such as cigarettes and e-cigarettes. If you need help quitting, ask your health care provider. It may also be helpful to avoid exposure to secondhand smoke.  Limit alcohol intake to no more than 1 drink a day for nonpregnant women and 2 drinks a day for men. One drink equals 12 oz of beer, 5 oz of wine, or 1 oz of hard liquor.  Stop any illegal drug use.  Avoid taking birth control pills. Talk to your health care provider about the risks of taking birth control pills if: ? You  are over 80 years old. ? You smoke. ? You get migraines. ? You have ever had a blood clot. What other changes can be made?  Manage your cholesterol levels. ? Eating a healthy diet is important for preventing high cholesterol. If cholesterol cannot be managed through diet alone, you may also need to take medicines. ? Take any prescribed medicines to control your cholesterol as told by your health care provider.  Manage your diabetes. ? Eating a healthy diet and exercising regularly are important parts of managing your blood sugar. If your blood sugar cannot be managed through diet and exercise, you may need to take medicines. ? Take any prescribed medicines to control your diabetes as told by your health care provider.  Control your hypertension. ? To reduce your risk of stroke, try to keep your blood pressure below 130/80. ? Eating a healthy diet and exercising regularly are an important part of controlling your blood pressure. If your blood pressure cannot be managed through diet and exercise, you may need to take medicines. ? Take any prescribed medicines to control hypertension as told by your health care provider. ? Ask your health care provider if you should monitor your blood pressure at home. ? Have your blood pressure checked every year, even if your blood pressure is normal. Blood pressure increases with age and some medical conditions.  Get evaluated for sleep disorders (sleep apnea). Talk to your health care provider about getting a sleep evaluation if you snore a lot or have excessive sleepiness.  Take over-the-counter and prescription medicines only as told by your health care provider. Aspirin or blood thinners (antiplatelets or anticoagulants) may be recommended to reduce your risk of forming blood clots that can lead to stroke.  Make sure that any other medical conditions you have, such as atrial fibrillation or atherosclerosis, are managed. What are the warning signs of a  stroke? The warning signs of a stroke can be easily remembered as BEFAST.  B is for balance. Signs include: ? Dizziness. ? Loss of balance or coordination. ? Sudden trouble walking.  E is for eyes. Signs include: ? A sudden change in vision. ? Trouble seeing.  F is for face. Signs include: ? Sudden weakness or numbness of the face. ? The face or eyelid drooping to one side.  A is for arms. Signs include: ? Sudden weakness or numbness of the arm, usually on one side of the body.  S is for speech. Signs include: ? Trouble speaking (aphasia). ? Trouble understanding.  T is for time. ? These symptoms may represent a serious problem that is an emergency. Do not wait to see if the symptoms will go away. Get medical help right away. Call your local emergency services (911 in the [place] Do not drive yourself to the hospital.  Other signs of stroke may include: ? A sudden, severe headache with no known cause. ? Nausea or vomiting. ? Seizure.  Where to find more information: For more information, visit:  American Stroke Association: www.strokeassociation.org  National Stroke Association: www.stroke.org  Summary  You can prevent a stroke by eating healthy, exercising, not smoking, limiting alcohol intake, and managing any medical conditions you may have.  Do not use any products that contain nicotine or tobacco, such as cigarettes and e-cigarettes. If you need help quitting, ask your health care provider. It may also be helpful to avoid exposure to secondhand smoke.  Remember BEFAST for warning signs of stroke. Get help right away if you or a loved one has any of these signs. This information is not intended to replace advice given to you by your health care provider. Make sure you discuss any questions you have with your health care provider. Document Released: 03/01/2004 Document Revised: 02/28/2016 Document Reviewed: 02/28/2016 Elsevier Interactive Patient Education  [employer].

## 2017-10-15 NOTE — Telephone Encounter (Signed)
lvm for pt to call back about scheduling mri  [employer] Auth: [id number] (exp. 10/15/17 to 11/13/17)

## 2017-10-16 ENCOUNTER — Ambulatory Visit: Payer: BC Managed Care – PPO | Admitting: Neurology

## 2017-10-16 ENCOUNTER — Other Ambulatory Visit: Payer: Self-pay | Admitting: Cardiology

## 2017-10-16 ENCOUNTER — Ambulatory Visit (INDEPENDENT_AMBULATORY_CARE_PROVIDER_SITE_OTHER): Payer: BC Managed Care – PPO

## 2017-10-16 DIAGNOSIS — I4891 Unspecified atrial fibrillation: Secondary | ICD-10-CM

## 2017-10-16 DIAGNOSIS — I639 Cerebral infarction, unspecified: Secondary | ICD-10-CM | POA: Diagnosis not present

## 2017-10-16 DIAGNOSIS — I10 Essential (primary) hypertension: Secondary | ICD-10-CM

## 2017-10-16 DIAGNOSIS — G459 Transient cerebral ischemic attack, unspecified: Secondary | ICD-10-CM | POA: Diagnosis not present

## 2017-10-16 DIAGNOSIS — I2510 Atherosclerotic heart disease of native coronary artery without angina pectoris: Secondary | ICD-10-CM | POA: Diagnosis not present

## 2017-10-17 ENCOUNTER — Other Ambulatory Visit: Payer: Self-pay

## 2017-10-17 ENCOUNTER — Ambulatory Visit (HOSPITAL_COMMUNITY): Payer: BC Managed Care – PPO | Attending: Neurology

## 2017-10-17 DIAGNOSIS — Z87891 Personal history of nicotine dependence: Secondary | ICD-10-CM | POA: Insufficient documentation

## 2017-10-17 DIAGNOSIS — I132 Hypertensive heart and chronic kidney disease with heart failure and with stage 5 chronic kidney disease, or end stage renal disease: Secondary | ICD-10-CM | POA: Insufficient documentation

## 2017-10-17 DIAGNOSIS — I6381 Other cerebral infarction due to occlusion or stenosis of small artery: Secondary | ICD-10-CM | POA: Insufficient documentation

## 2017-10-17 DIAGNOSIS — R55 Syncope and collapse: Secondary | ICD-10-CM | POA: Diagnosis not present

## 2017-10-17 DIAGNOSIS — N186 End stage renal disease: Secondary | ICD-10-CM | POA: Insufficient documentation

## 2017-10-17 DIAGNOSIS — E1122 Type 2 diabetes mellitus with diabetic chronic kidney disease: Secondary | ICD-10-CM | POA: Diagnosis not present

## 2017-10-17 DIAGNOSIS — I2510 Atherosclerotic heart disease of native coronary artery without angina pectoris: Secondary | ICD-10-CM | POA: Insufficient documentation

## 2017-10-17 DIAGNOSIS — I071 Rheumatic tricuspid insufficiency: Secondary | ICD-10-CM | POA: Diagnosis not present

## 2017-10-17 DIAGNOSIS — I252 Old myocardial infarction: Secondary | ICD-10-CM | POA: Insufficient documentation

## 2017-10-17 DIAGNOSIS — I5042 Chronic combined systolic (congestive) and diastolic (congestive) heart failure: Secondary | ICD-10-CM | POA: Diagnosis not present

## 2017-10-17 DIAGNOSIS — E7800 Pure hypercholesterolemia, unspecified: Secondary | ICD-10-CM | POA: Insufficient documentation

## 2017-10-17 DIAGNOSIS — I255 Ischemic cardiomyopathy: Secondary | ICD-10-CM | POA: Diagnosis not present

## 2017-10-22 LAB — HM DIABETES EYE EXAM

## 2017-10-22 IMAGING — DX DG CHEST 2V
2 series · 2 of 2 positions shown · non-contrast
Comparison: 03/03/2010

CLINICAL DATA: Worsening chest pain radiating to left arm for past
2 weeks. Hypertension, diabetes, and hypercholesterolemia.

EXAM:
CHEST  2 VIEW

[chest pa]
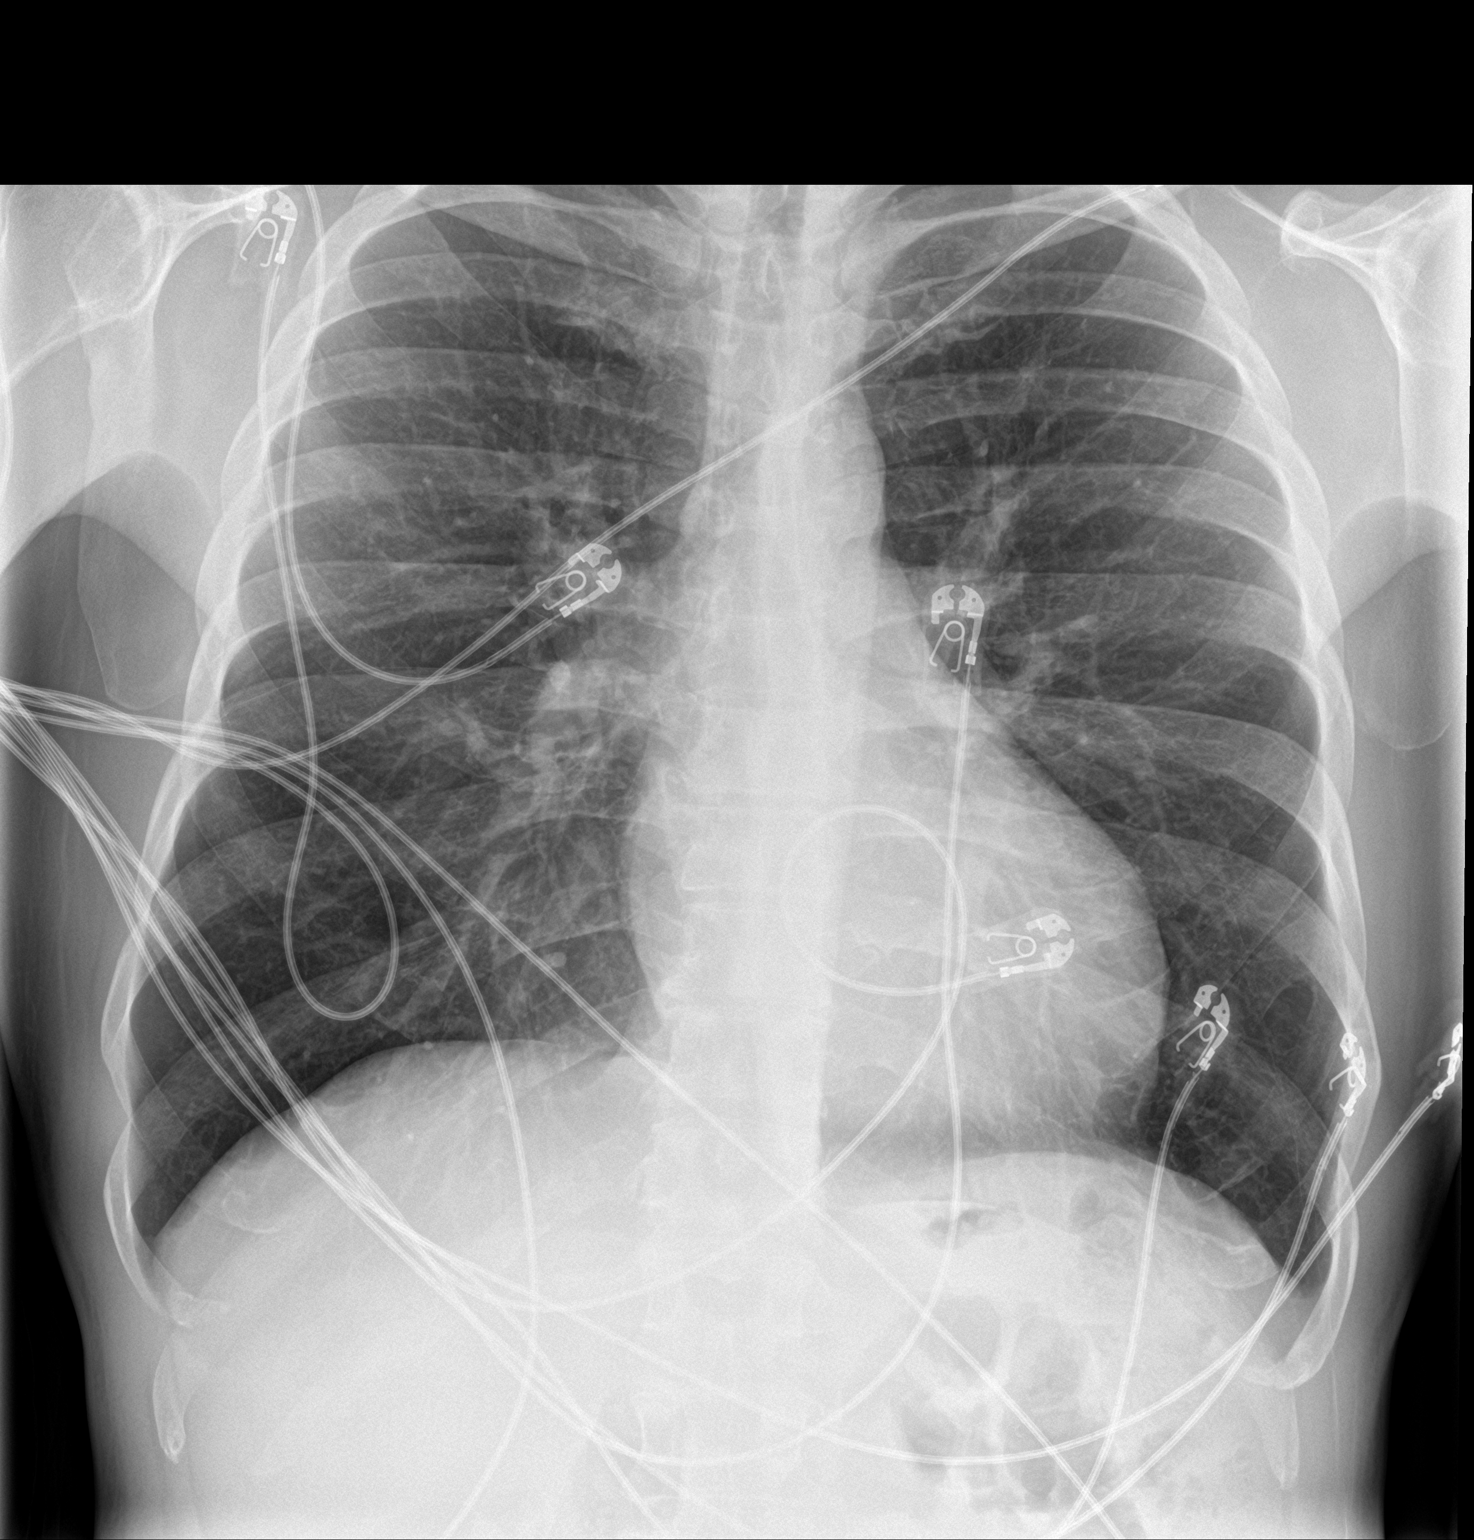

[chest lat]
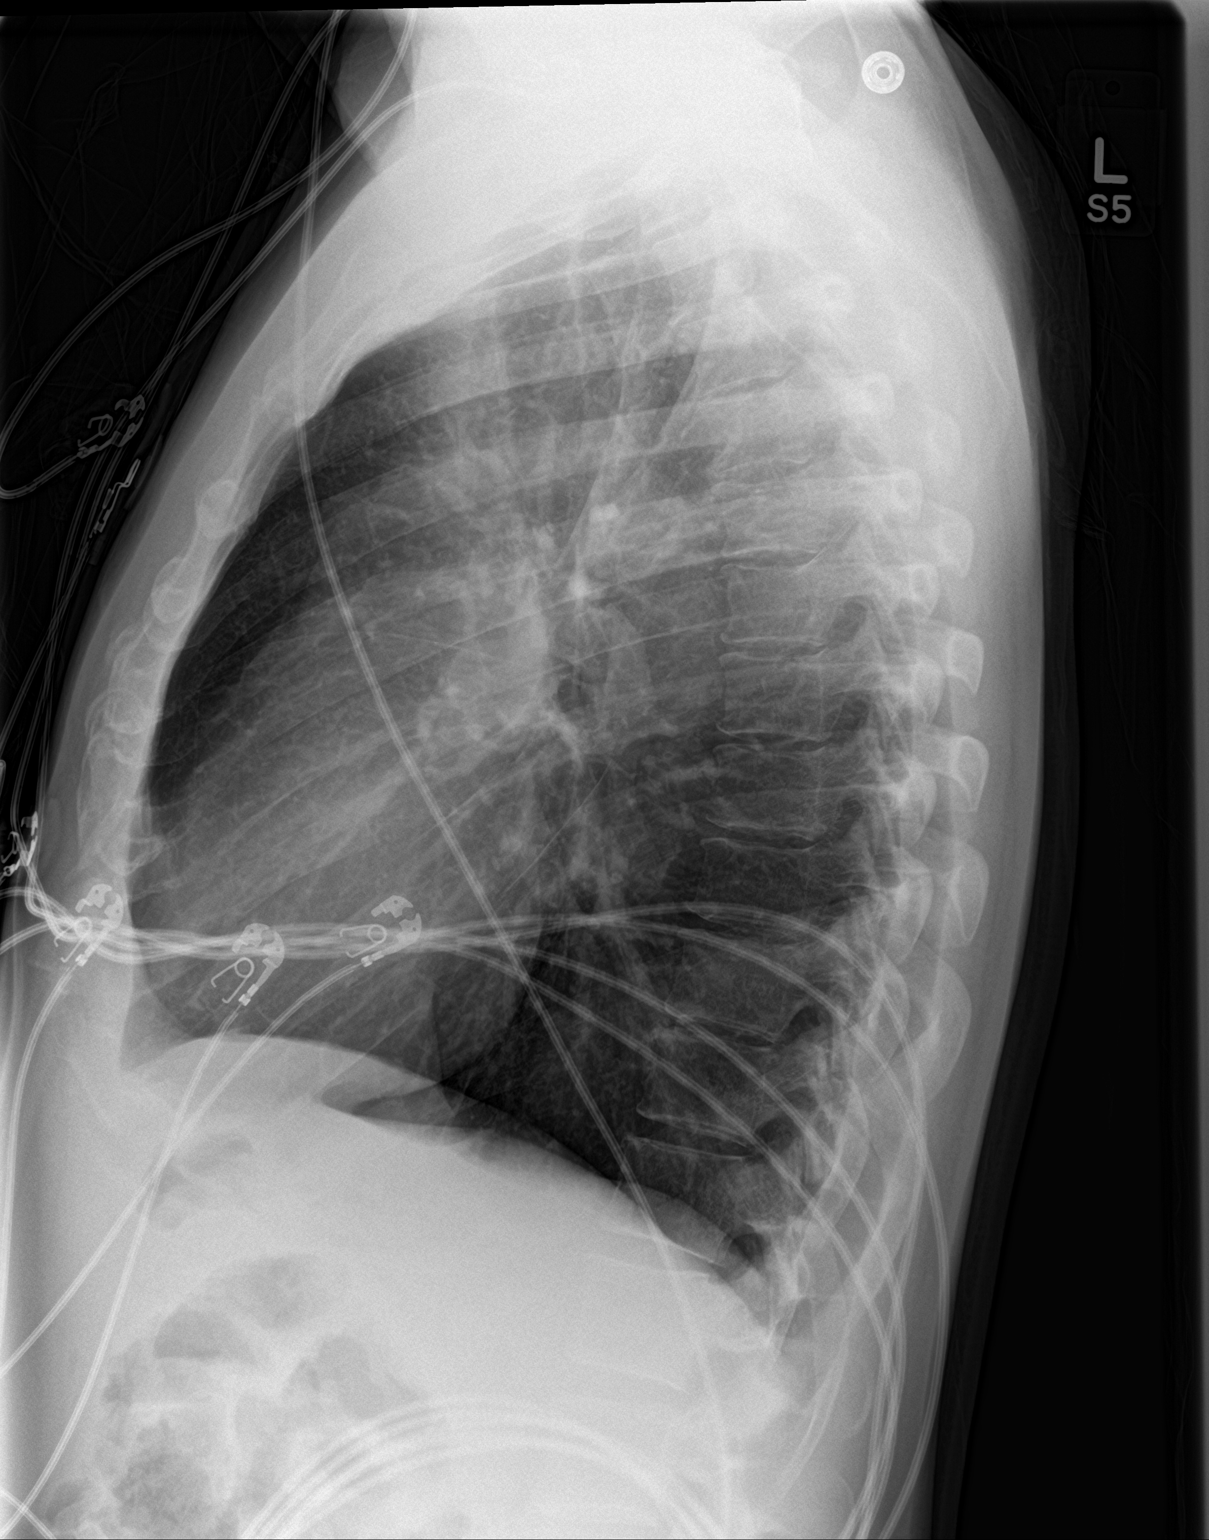

[2 of 2 positions shown; findings below may reference images not displayed]

FINDINGS: The heart size and mediastinal contours are within normal limits.
Both lungs are clear. The visualized skeletal structures are
unremarkable.
IMPRESSION: Negative.  No active cardiopulmonary disease.

## 2017-10-24 ENCOUNTER — Ambulatory Visit (INDEPENDENT_AMBULATORY_CARE_PROVIDER_SITE_OTHER): Payer: BC Managed Care – PPO | Admitting: Internal Medicine

## 2017-10-24 ENCOUNTER — Encounter: Payer: Self-pay | Admitting: Internal Medicine

## 2017-10-24 VITALS — BP 122/60 | HR 74 | Ht 62.0 in | Wt 126.0 lb

## 2017-10-24 DIAGNOSIS — J449 Chronic obstructive pulmonary disease, unspecified: Secondary | ICD-10-CM | POA: Diagnosis not present

## 2017-10-24 MED ORDER — FLUTICASONE FUROATE-VILANTEROL 100-25 MCG/INH IN AEPB: 1.0000 | INHALATION_SPRAY | Freq: Every day | RESPIRATORY_TRACT | 0 refills | Status: DC

## 2017-10-24 MED ORDER — FLUTICASONE FUROATE-VILANTEROL 100-25 MCG/INH IN AEPB: 1.0000 | INHALATION_SPRAY | Freq: Every day | RESPIRATORY_TRACT | 11 refills | Status: DC

## 2017-10-24 NOTE — Progress Notes (Signed)
Colin Behrle, male    DOB: July 14, 1965, 52 y.o.   MRN: [id number]    Brief patient profile: 52 yobm quit smoking 05/2017 w/c bound p cva since 2018  S/p admit   Admit date: 06/25/2017 Discharge date: 07/04/2017  PCP: Knox Royalty, MD  DISCHARGE DIAGNOSES:  End-stage renal disease  RECOMMENDATIONS FOR OUTPATIENT FOLLOW UP: 1. Patient to go for outpatient dialysis starting Friday, May 31.  He will be on a Monday Wednesday Friday schedule. 2. Check CBGs before meals and at bedtime.   DISCHARGE CONDITION: fair  Diet recommendation: Modified carbohydrate       Filed Weights   07/02/17 0650 07/02/17 1000 07/04/17 0514  Weight: 56.4 kg (124 lb 5.4 oz) 55.4 kg (122 lb 2.2 oz) 57 kg (125 lb 10.6 oz)    INITIAL HISTORY: 52 y.o.malewith medical history significant ofDM; HTN; HLD; and CAD, CKD, history of TIAs, presented with generalized weakness, terms of uremia, worsening creatinine.He was having ongoing fatigue and he saw nephrology; they suggested he come in to start HD.  Consultants:Nephrology  Procedures: Hemodialysis Hemodialysis catheter placement Left upper arm AV graft   HOSPITAL COURSE:   Chronic kidney disease stage V now progressed to end-stage renal disease Patient seen by nephrology. Patient was dialyzed after his catheter placement. Underwent AV graft placement on 5/24. Last dialyzed on 5/28.   Patient has been established at an outpatient dialysis center.  His first session of dialysis will be on May 31.Patient will need to go to skilled nursing facility for rehab and inability to care for himself.  Diabetes mellitus type 2 with diabetic nephropathy Uncontrolled with hyperglycemia.  Continue insulin. Monitor CBGs. HbA1c 6.9 in April.  Essential hypertension Stable. Continue home medications.  Hyperlipidemia Continue statin.  Chronic combined systolic and diastolic CHF Echocardiogram from January showed preserved EF with  grade 1 diastolic dysfunction. Volume being managed with dialysis.  History of depression Continue Zoloft.  Normocytic anemia Likely anemia of chronic kidney disease.Hemoglobin is stable.  History of stroke Stable. Continue with aspirin Plavix.  Patient had an episode of transient confusion this morning.  When he woke up he seemed to be agitated.  He was disoriented.  Did not have any focal deficits at that time.  CT scan of the head was done which shows only chronic atrophy and old infarcts.  Patient reexamined.  He does not know why the confusion occurred this morning.  He now knows where he is.  Denies any headaches.  No focal neurological deficits are appreciated.  Could have experienced sundowning.  Patient was reassured.  Also discussed with his spouse and reassured.  Patient is medically stable for discharge to skilled nursing facility.    10/24/2017  Pulmonary/ Initial pulmonary eval/ Shardae Kleinman  Chief Complaint  Patient presents with  . Pulmonary Consult    Referred by Dr. Chilton Si. Pt states he his here for abnormal cxr. He states he has had a cough for 20 yrs. He occ will produce some green sputum.     Dyspnea:  Room to room ok with walker 2 wheels ok but always uses w/c to go out x one year  Cough: some rattling x years not really better since quit smoking  Sleep: flat/ one pillow  SABA use: none  02 none    No obvious day to day or daytime variability or assoc excess/ purulent sputum or mucus plugs or hemoptysis or cp or chest tightness, subjective wheeze or overt sinus or hb symptoms.   Sleeps as  above without nocturnal  or early am exacerbation  of respiratory  c/o's or need for noct saba. Also denies any obvious fluctuation of symptoms with weather or environmental changes or other aggravating or alleviating factors except as outlined above   No unusual exposure hx or h/o childhood pna/ asthma or knowledge of premature birth.  Current Allergies, Complete Past Medical  History, Past Surgical History, Family History, and Social History were reviewed in [employer] record.  ROS  The following are not active complaints unless bolded Hoarseness, sore throat, dysphagia, dental problems, itching, sneezing,  nasal congestion or discharge of excess mucus or purulent secretions, ear ache,   fever, chills, sweats, unintended wt loss or wt gain, classically pleuritic or exertional cp,  orthopnea pnd or arm/hand swelling  or leg swelling, presyncope, palpitations, abdominal pain, anorexia, nausea, vomiting, diarrhea  or change in bowel habits or change in bladder habits, change in stools or change in urine, dysuria, hematuria,  rash, arthralgias, visual complaints, headache, numbness, weakness or ataxia or problems with walking or coordination,  change in mood or  memory.             Past Medical History:  Diagnosis Date  . Chest pain 01/10/2016  . Chronic combined systolic and diastolic heart failure (HCC)   . Coronary artery disease    a.12/22/15: NSTEMI s/p overlapping DES x2 and balloon angioplasty to distal AV groove Circumflex (too small for a stent).   . Coronary artery disease due to lipid rich plaque 01/10/2016  . Dehydration 08/22/2012  . Depression   . Elevated troponin   . ESRD (end stage renal disease) on dialysis (HCC)   . Essential hypertension   . History of completed stroke 05/28/2017  . Hypercholesteremia   . Hypertension   . Hypertensive heart disease with heart failure (HCC)   . Ischemic cardiomyopathy 03/13/2016  . Normocytic anemia 05/28/2017  . NSTEMI (non-ST elevated myocardial infarction) (HCC) 12/22/2015  . Prostate infection   . Status post coronary artery stent placement   . Syncope 03/13/2016  . Tobacco abuse   . Type 2 diabetes mellitus with diabetic nephropathy, without long-term current use of insulin (HCC)   . Type II diabetes mellitus (HCC)   . Uncontrolled hypertension 05/28/2017  . Unstable angina pectoris  (HCC) 01/11/2016    Outpatient Medications Prior to Visit  Medication Sig Dispense Refill  . aspirin 81 MG chewable tablet Chew 1 tablet (81 mg total) by mouth daily. 90 tablet 1  . atorvastatin (LIPITOR) 10 MG tablet Take 1 tablet (10 mg total) by mouth daily. 90 tablet 2  . calcitRIOL (ROCALTROL) 0.5 MCG capsule Take 1 capsule (0.5 mcg total) by mouth every Monday, Wednesday, and Friday with hemodialysis. 30 capsule 1  . carvedilol (COREG) 6.25 MG tablet Take 1 tablet (6.25 mg total) by mouth 2 (two) times daily with a meal. 180 tablet 2  . clopidogrel (PLAVIX) 75 MG tablet Take 1 tablet (75 mg total) by mouth daily. 90 tablet 2  . docusate sodium (COLACE) 100 MG capsule Take 1 capsule (100 mg total) by mouth 2 (two) times daily. 60 capsule 1  . insulin aspart (NOVOLOG) 100 UNIT/ML FlexPen Inject 0-10 Units into the skin 3 (three) times daily before meals. Sliding Scale: 0-150: 0u; 151-200: 2u; 201-250: 4u; 251-300: 6u; 301-350: 8u; 351-400:10u. 401+ Give 10u and call MD 15 mL 3  . Insulin Pen Needle (PEN NEEDLES) 32G X 4 MM MISC 1 Dose by Does not apply route  3 (three) times daily. Use with sliding scale novolog with meals. 90 each 3  . multivitamin (RENA-VIT) TABS tablet Take 1 tablet by mouth at bedtime. 90 tablet 1  . nitroGLYCERIN (NITROSTAT) 0.4 MG SL tablet Place 1 tablet (0.4 mg total) under the tongue every 5 (five) minutes x 3 doses as needed for chest pain. 25 tablet 3  . Nutritional Supplements (FEEDING SUPPLEMENT, NEPRO CARB STEADY,) LIQD Take 237 mLs by mouth 3 (three) times daily as needed (Supplement).  0  . ONE TOUCH LANCETS MISC One Touch Delica Lancet 33g.  Please provide Verio test strips.  To test four times daily. 100 each 3  . sertraline (ZOLOFT) 50 MG tablet Take 1 tablet (50 mg total) by mouth daily. 90 tablet 1  . sevelamer carbonate (RENVELA) 800 MG tablet Take 1 tablet (800 mg total) by mouth 3 (three) times daily with meals. 90 tablet 1  . traZODone (DESYREL) 50 MG  tablet Take 1 tablet (50 mg total) by mouth at bedtime. 90 tablet 1   No facility-administered medications prior to visit.               Objective:     BP 122/60 (BP Location: Left Arm, Cuff Size: Normal)   Pulse 74   Ht 5\' 2"  (1.575 m)   Wt 126 lb (57.2 kg)   SpO2 96%   BMI 23.05 kg/m   SpO2: 96 %  RA      W/c bound bm nad > stated age /rattling congested sounding cough     HEENT: nl oropharynx. Nl external ear canals without cough reflex -  Mild  bilateral non-specific turbinate edema  / full dentures   NECK :  without JVD/Nodes/TM/ nl carotid upstrokes bilaterally   LUNGS: no acc muscle use,  Mild barrel  contour chest wall with bilateral  Distant  Rhonchi   without cough on insp or exp maneuver and mild  Hyperresonant  to  percussion bilaterally     CV:  RRR  no s3 or murmur or increase in P2, and no edema   ABD:  soft and nontender with pos late  insp Hoover's  in the supine position. No bruits or organomegaly appreciated, bowel sounds nl  MS:   Nl gait/  ext warm without deformities, calf tenderness, cyanosis or clubbing No obvious joint restrictions   SKIN: warm and dry without lesions    NEURO:  alert, approp, nl sensorium with  no motor or cerebellar deficits apparent.       I personally reviewed images and agree with radiology impression as follows:  CXR:    10/03/17 No active cardiopulmonary disease.         Assessment   COPD GOLD III  Spirometry 10/24/2017  FEV1 1.3 (43%)  Ratio 67 with mild curvature  - 10/24/2017  After extensive coaching inhaler device,  effectiveness =    90% with dpi > try BREO 100 one click each am    He has moderately severe COPD on his initial spirometry but we did not do a postbronchodilator study and I suspect it is not that bad once he has been treated but plan to start with a simple LABA/ICS combination to see if we can help him with his main complaint which is really more related to the cough and congestion  and it is limiting dyspnea but consider switching over to LABA / lama or switching over to triple therapy depending on whether or not his symptoms respond to  the above.   It may well turn out however because he is so sedentary that he doesn't really benefit much in terms of aggressive pulmonary treatment at this point. That is to say, As I explained to this patient in detail:  although there may be copd present, it may not be clinically relevant:   it may not be limiting activity tolerance any more than a set of worn tires limits someone from driving a car  around a parking lot.  A new set of Michelins might look good but would have no perceived impact on the performance of the car and would not be worth the cost.  In that regard, Formulary restrictions will be an ongoing challenge for the forseable future and I would be happy to pick an alternative if the pt will first  provide me a list of them but pt  will need to return here for training for any new device that is required eg dpi vs hfa vs respimat.    In meantime we can always provide samples so the patient never runs out of any needed respiratory medications.    I encouraged him to be as active as possible to reduce the likelihood of deconditioning and to consider outpatient pulmonary rehabilitation at some point but we'll see him back in 6 weeks to see how he is doing on this regimen with full PFTs on return.   Total time devoted to counseling  > 50 % of initial 60 min office visit:  review case with pt/ discussion of options/alternatives/ personally creating written customized instructions  in presence of pt  then going over those specific  Instructions directly with the pt including how to use all of the meds but in particular covering each new medication in detail and the difference between the maintenance= "automatic" meds and the prns using an action plan format for the latter (If this problem/symptom => do that organization reading Left to  right).  Please see AVS from this visit for a full list of these instructions which I personally wrote for this pt and  are unique to this visit.   See device teaching which extended face to face time for this visit            Sandrea Hughs, MD 10/24/2017

## 2017-10-24 NOTE — Patient Instructions (Signed)
BREO 100 one puff each am to see whether this helps or not and if it does then fill it, if not don't    Please schedule a follow up office visit in 6 weeks, call sooner if needed with PFT's on return

## 2017-10-25 ENCOUNTER — Encounter: Payer: Self-pay | Admitting: Internal Medicine

## 2017-10-25 NOTE — Assessment & Plan Note (Addendum)
Spirometry 10/24/2017  FEV1 1.3 (43%)  Ratio 67 with mild curvature  - 10/24/2017  After extensive coaching inhaler device,  effectiveness =    90% with dpi > try BREO 160 one click each am    He has moderately severe COPD on his initial spirometry but we did not do a postbronchodilator study and I suspect it is not that bad once he has been treated but plan to start with a simple LABA/ICS combination to see if we can help him with his main complaint which is really more related to the cough and congestion and it is limiting dyspnea but consider switching over to LABA / lama or switching over to triple therapy depending on whether or not his symptoms respond to the above.   It may well turn out however because he is so sedentary that he doesn't really benefit much in terms of aggressive pulmonary treatment at this point. That is to say, As I explained to this patient in detail:  although there may be copd present, it may not be clinically relevant:   it may not be limiting activity tolerance any more than a set of worn tires limits someone from driving a car  around a parking lot.  A new set of Michelins might look good but would have no perceived impact on the performance of the car and would not be worth the cost.  In that regard, Formulary restrictions will be an ongoing challenge for the forseable future and I would be happy to pick an alternative if the pt will first  provide me a list of them but pt  will need to return here for training for any new device that is required eg dpi vs hfa vs respimat.    In meantime we can always provide samples so the patient never runs out of any needed respiratory medications.    I encouraged him to be as active as possible to reduce the likelihood of deconditioning and to consider outpatient pulmonary rehabilitation at some point but we'll see him back in 6 weeks to see how he is doing on this regimen with full PFTs on return.   Total time devoted to counseling   > 50 % of initial 60 min office visit:  review case with pt/ discussion of options/alternatives/ personally creating written customized instructions  in presence of pt  then going over those specific  Instructions directly with the pt including how to use all of the meds but in particular covering each new medication in detail and the difference between the maintenance= "automatic" meds and the prns using an action plan format for the latter (If this problem/symptom => do that organization reading Left to right).  Please see AVS from this visit for a full list of these instructions which I personally wrote for this pt and  are unique to this visit.   See device teaching which extended face to face time for this visit

## 2017-10-29 ENCOUNTER — Ambulatory Visit: Payer: BC Managed Care – PPO

## 2017-11-01 ENCOUNTER — Telehealth: Payer: Self-pay | Admitting: *Deleted

## 2017-11-01 NOTE — Telephone Encounter (Signed)
Spoke with daughter, Marval Regal on [place] and informed her that the patient's echocardiogram study was normal. She verbalized understanding, appreciation.

## 2017-11-05 ENCOUNTER — Ambulatory Visit: Payer: BC Managed Care – PPO | Admitting: Family Medicine

## 2017-11-05 ENCOUNTER — Encounter: Payer: Self-pay | Admitting: Family Medicine

## 2017-11-05 ENCOUNTER — Other Ambulatory Visit: Payer: Self-pay

## 2017-11-05 VITALS — BP 100/64 | HR 63 | Temp 98.4°F | Ht 65.0 in

## 2017-11-05 DIAGNOSIS — E1149 Type 2 diabetes mellitus with other diabetic neurological complication: Secondary | ICD-10-CM | POA: Diagnosis not present

## 2017-11-05 DIAGNOSIS — J449 Chronic obstructive pulmonary disease, unspecified: Secondary | ICD-10-CM

## 2017-11-05 DIAGNOSIS — I10 Essential (primary) hypertension: Secondary | ICD-10-CM | POA: Diagnosis not present

## 2017-11-05 DIAGNOSIS — Z794 Long term (current) use of insulin: Secondary | ICD-10-CM

## 2017-11-05 NOTE — Progress Notes (Signed)
Subjective:  By signing my name below, I, Jeffery Young, attest that this documentation has been prepared under the direction and in the presence of [name], MD Electronically Signed: Ladene Artist, ED Scribe 11/05/2017 at 10:52 AM.   Patient ID: Jeffery Young, male    DOB: [birth year], 52 y.o.   MRN: [id number]  Chief Complaint  Patient presents with  . Hypertension    1 m f/u   . Diabetes   HPI  Jeffery Young is a 52 y.o. male who presents to Primary Care at [facility] for f/u. Last seen 8/29.  DM Referred to endo with prev variable readings. Had low readings in Aug, continued on sliding scale. Stopped Lantus temporarily. I do not see that he's seen endo yet. - Pt does not recall if he's received a call from endo yet. Home blood glucose ranging from 77-143, low 100s/high 90s multiple occasions. Peaked at 170 after eating fast food, single reading of 266 towards the beginning of Sept. He has stopped Lantus and has not used sliding scale since the beginning of Sept. Lab Results  Component Value Date   HGBA1C 6.8 (H) 09/05/2017   HTN with Lacunar Infarct, CAD and Ischemic CM Lab Results  Component Value Date   CREATININE 6.16 (H) 07/04/2017   BP Readings from Last 3 Encounters:  11/05/17 100/64  10/24/17 122/60  10/15/17 (!) 94/59  Saw cardiology 9/5. Most recent echo 2018, normal EF. Continued med therapy with asa, plavix, betablocker, statin. BP controlled at OV.  H/o Lacunar Infarct Seen by neuro 9/10. Continued asa and plavix for secondary stroke prevention. Planned on MRI of brain and neck, echo for stroke risk stratification. Use of walker at all times with fall/safety precautions given. Planned for 2 months f/u with neuro. Recent echo 9/12, normal. EF 60-65%. - Denies rash/wounds where he sits. Pt is using walker at home; currently living with his ex-wife which he states has been going fine. Reports that he eventually would like to get his own place.  COPD Seen 9/19 by  pulm. Planned on LADA/ICS. Started on breo with 6 wk f/u. - Pt started Breo which he states has made cough a little less frequent.  CKD On hemodialysis 3 days/wk.  Patient Active Problem List   Diagnosis Date Noted  . COPD GOLD III  10/24/2017  . CAD (coronary artery disease), native coronary artery 10/10/2017  . ESRD (end stage renal disease) on dialysis ([place]) 06/25/2017  . Uncontrolled hypertension 05/28/2017  . Depression 05/28/2017  . Normocytic anemia 05/28/2017  . History of completed stroke 05/28/2017  . Syncope 03/13/2016  . Ischemic cardiomyopathy 03/13/2016  . Unstable angina pectoris ([place]) 01/11/2016  . Hypertensive heart disease with heart failure ([place])   . Chronic combined systolic and diastolic heart failure ([place])   . Elevated troponin   . Chest pain 01/10/2016  . Coronary artery disease due to lipid rich plaque 01/10/2016  . Status post coronary artery stent placement   . Type 2 diabetes mellitus with diabetic nephropathy, without long-term current use of insulin ([place])   . Tobacco abuse   . Essential hypertension   . Hypercholesteremia   . NSTEMI (non-ST elevated myocardial infarction) ([place]) 12/22/2015  . Lightheadedness 08/22/2012  . Dehydration 08/22/2012   Past Medical History:  Diagnosis Date  . Chest pain 01/10/2016  . Chronic combined systolic and diastolic heart failure ([place])   . Coronary artery disease    a.12/22/15: NSTEMI s/p overlapping DES x2 and balloon angioplasty to distal  AV groove Circumflex (too small for a stent).   . Coronary artery disease due to lipid rich plaque 01/10/2016  . Dehydration 08/22/2012  . Depression   . Elevated troponin   . ESRD (end stage renal disease) on dialysis ([place])   . Essential hypertension   . History of completed stroke 05/28/2017  . Hypercholesteremia   . Hypertension   . Hypertensive heart disease with heart failure ([place])   . Ischemic cardiomyopathy 03/13/2016  . Normocytic anemia 05/28/2017  . NSTEMI (non-ST  elevated myocardial infarction) ([place]) 12/22/2015  . Prostate infection   . Status post coronary artery stent placement   . Syncope 03/13/2016  . Tobacco abuse   . Type 2 diabetes mellitus with diabetic nephropathy, without long-term current use of insulin ([place])   . Type II diabetes mellitus ([place])   . Uncontrolled hypertension 05/28/2017  . Unstable angina pectoris ([place]) 01/11/2016   Past Surgical History:  Procedure Laterality Date  . BASCILIC VEIN TRANSPOSITION Left 06/28/2017   Procedure: LEFT UPPER ARM ARTERIOVENOUS GORTEX-GRAFT PLACEMENT;  Surgeon: Angelia Mould, MD;  Location: [facility];  Service: Vascular;  Laterality: Left;  . CARDIAC CATHETERIZATION N/A 12/22/2015   Procedure: Left Heart Cath and Coronary Angiography;  Surgeon: Burnell Blanks, MD;  Location: [place] CV LAB;  Service: Cardiovascular;  Laterality: N/A;  . CARDIAC CATHETERIZATION N/A 12/22/2015   Procedure: Coronary Stent Intervention;  Surgeon: Burnell Blanks, MD;  Location: [place] CV LAB;  Service: Cardiovascular;  Laterality: N/A;  . CARDIAC CATHETERIZATION N/A 01/11/2016   Procedure: Left Heart Cath and Coronary Angiography;  Surgeon: Sherren Mocha, MD;  Location: [place] [facility];  Service: Cardiovascular;  Laterality: N/A;  . CARDIAC CATHETERIZATION N/A 01/11/2016   Procedure: Intravascular Pressure Wire/FFR Study;  Surgeon: Sherren Mocha, MD;  Location: [place] CV LAB;  Service: Cardiovascular;  Laterality: N/A;  . CARDIAC CATHETERIZATION N/A 01/11/2016   Procedure: Coronary Stent Intervention;  Surgeon: Sherren Mocha, MD;  Location: [place] [facility] LAB;  Service: Cardiovascular;  Laterality: N/A;  . CORONARY ANGIOPLASTY WITH STENT PLACEMENT  12/22/2015  . INSERTION OF DIALYSIS CATHETER Right 06/28/2017   Procedure: INSERTION OF DIALYSIS CATHETER RIGHT INTERNAL JUGULAR;  Surgeon: Angelia Mould, MD;  Location: [facility] OR;  Service: Vascular;  Laterality: Right;   No Known  Allergies Prior to Admission medications   Medication Sig Start Date End Date Taking? Authorizing Provider  aspirin 81 MG chewable tablet Chew 1 tablet (81 mg total) by mouth daily. 08/15/17   [name], MD  atorvastatin (LIPITOR) 10 MG tablet Take 1 tablet (10 mg total) by mouth daily. 10/10/17   Lyda Jester M, PA-C  calcitRIOL (ROCALTROL) 0.5 MCG capsule Take 1 capsule (0.5 mcg total) by mouth every Monday, Wednesday, and Friday with hemodialysis. 08/16/17   [name], MD  carvedilol (COREG) 6.25 MG tablet Take 1 tablet (6.25 mg total) by mouth 2 (two) times daily with a meal. 10/10/17   Lyda Jester M, PA-C  clopidogrel (PLAVIX) 75 MG tablet Take 1 tablet (75 mg total) by mouth daily. 10/10/17   Lyda Jester M, PA-C  docusate sodium (COLACE) 100 MG capsule Take 1 capsule (100 mg total) by mouth 2 (two) times daily. 08/15/17   [name], MD  fluticasone furoate-vilanterol (BREO ELLIPTA) 100-25 MCG/INH AEPB Inhale 1 puff into the lungs daily. 10/24/17   Tanda Rockers, MD  insulin aspart (NOVOLOG) 100 UNIT/ML FlexPen Inject 0-10 Units into the skin 3 (three) times daily  before meals. Sliding Scale: 0-150: 0u; 151-200: 2u; 201-250: 4u; 251-300: 6u; 301-350: 8u; 351-400:10u. 401+ Give 10u and call MD 10/03/17   [name], MD  Insulin Pen Needle (PEN NEEDLES) 32G X 4 MM MISC 1 Dose by Does not apply route 3 (three) times daily. Use with sliding scale novolog with meals. 10/03/17   [name], MD  multivitamin (RENA-VIT) TABS tablet Take 1 tablet by mouth at bedtime. 08/15/17   [name], MD  nitroGLYCERIN (NITROSTAT) 0.4 MG SL tablet Place 1 tablet (0.4 mg total) under the tongue every 5 (five) minutes x 3 doses as needed for chest pain. 12/26/15   Cheryln Manly, NP  Nutritional Supplements (FEEDING SUPPLEMENT, NEPRO CARB STEADY,) LIQD Take 237 mLs by mouth 3 (three) times daily as needed (Supplement). 07/04/17   Bonnielee Haff, MD  ONE [facility]  LANCETS MISC [facility] 99I.  Please provide Verio test strips.  To test four times daily. 08/30/17   [name], MD  sertraline (ZOLOFT) 50 MG tablet Take 1 tablet (50 mg total) by mouth daily. 08/15/17   [name], MD  sevelamer carbonate (RENVELA) 800 MG tablet Take 1 tablet (800 mg total) by mouth 3 (three) times daily with meals. 08/15/17   [name], MD  traZODone (DESYREL) 50 MG tablet Take 1 tablet (50 mg total) by mouth at bedtime. 08/15/17   [name], MD   Social History   Socioeconomic History  . Marital status: Married    Spouse name: Not on file  . Number of children: Not on file  . Years of education: Not on file  . Highest education level: Not on file  Occupational History  . Occupation: International aid/development worker: [employer]  Social Needs  . Financial resource strain: Not on file  . Food insecurity:    Worry: Not on file    Inability: Not on file  . Transportation needs:    Medical: Not on file    Non-medical: Not on file  Tobacco Use  . Smoking status: Former Smoker    Packs/day: 0.75    Years: 29.00    Pack years: 21.75    Types: Cigarettes    Last attempt to quit: 05/28/2017    Years since quitting: 0.4  . Smokeless tobacco: Never Used  Substance and Sexual Activity  . Alcohol use: Not Currently    Alcohol/week: 7.0 standard drinks    Types: 7 Cans of beer per week    Frequency: Never    Comment: last drink was prior to last hospitalization  . Drug use: No  . Sexual activity: Not Currently  Lifestyle  . Physical activity:    Days per week: Not on file    Minutes per session: Not on file  . Stress: Not on file  Relationships  . Social connections:    Talks on phone: Not on file    Gets together: Not on file    Attends religious service: Not on file    Active member of club or organization: Not on file    Attends meetings of clubs or organizations: Not on file    Relationship status: Not on file  .  Intimate partner violence:    Fear of current or ex partner: Not on file    Emotionally abused: Not on file    Physically abused: Not on file    Forced sexual activity: Not on file  Other Topics Concern  . Not on file  Social History Narrative  . Not on file   Review of Systems  Respiratory: Positive for cough (improved).   Skin: Negative for rash and wound.      Objective:   Physical Exam  Constitutional: He is oriented to person, place, and time. He appears well-developed and well-nourished.  HENT:  Head: Normocephalic and atraumatic.  Eyes: Pupils are equal, round, and reactive to light. EOM are normal.  Neck: No JVD present. Carotid bruit is not present.  Cardiovascular: Normal rate, regular rhythm and normal heart sounds.  No murmur heard. Pulmonary/Chest: Effort normal and breath sounds normal. He has no rales.  Lungs clear after cough.  Musculoskeletal: He exhibits no edema.  Neurological: He is alert and oriented to person, place, and time.  Skin: Skin is warm and dry.  Psychiatric: He has a normal mood and affect.  Vitals reviewed.  Vitals:   11/05/17 1022  BP: 100/64  Pulse: 63  Temp: 98.4 F (36.9 C)  TempSrc: Oral  SpO2: 97%  Height: 5\' 5"  (1.651 m)      Assessment & Plan:  Jeffery Young is a 52 y.o. male Type 2 diabetes mellitus with other neurologic complication, with long-term current use of insulin ([place])  -Overall has been fairly controlled readings off Lantus as well as sliding scale insulin.  Continue to monitor with sliding scale needed, endocrinology eval pending.  Recheck 1 month if has not seen endocrine to repeat A1c.  Essential hypertension  -Stable on current regimen, continue follow-up with cardiology, no change in regimen.  Chronic obstructive pulmonary disease, unspecified COPD type ([place])  -Improved with new inhaler.  RTC precautions if worsening symptoms  No orders of the defined types were placed in this encounter.  Patient  Instructions     I will check into diabetes specialist referral.  Continue follow up as planned with other specialists.  Recheck in 1 month with me for diabetes. If there are plans for different living situation, let me know as it may be good to have an assessment by physical/occupational therapy to determine any specific needs.   Thank you for coming in today.   If you have lab work done today you will be contacted with your lab results within the next 2 weeks.  If you have not heard from [place] then please contact us. The fastest way to get your results is to register for My Chart.   IF you received an x-ray today, you will receive an invoice from [facility] Rad[facility]. Please contact [facility] Radiology at [phone] with questions or concerns regarding your invoice.   IF you received labwork today, you will receive an invoice from [place]. Please contact [facility] at [phone] with questions or concerns regarding your invoice.   Our billing staff will not be able to assist you with questions regarding bills from these companies.  You will be contacted with the lab results as soon as they are available. The fastest way to get your results is to activate your My Chart account. Instructions are located on the last page of this paperwork. If you have not heard from [place] regarding the results in 2 weeks, please contact this office.      I personally performed the services described in this documentation, which was scribed in my presence. The recorded information has been reviewed and considered for accuracy and completeness, addended by me as needed, and agree with information above.  Signed,   Merri Ray, MD  Primary Care at [place].  11/07/17 12:46 PM

## 2017-11-05 NOTE — Patient Instructions (Addendum)
   I will check into diabetes specialist referral.  Continue follow up as planned with other specialists.  Recheck in 1 month with me for diabetes. If there are plans for different living situation, let me know as it may be good to have an assessment by physical/occupational therapy to determine any specific needs.   Thank you for coming in today.   If you have lab work done today you will be contacted with your lab results within the next 2 weeks.  If you have not heard from [place] then please contact us. The fastest way to get your results is to register for My Chart.   IF you received an x-ray today, you will receive an invoice from [facility] Radiology. Please contact [facility] Radiology at [phone] with questions or concerns regarding your invoice.   IF you received labwork today, you will receive an invoice from [facility]. Please contact [facility] at [phone] with questions or concerns regarding your invoice.   Our billing staff will not be able to assist you with questions regarding bills from these companies.  You will be contacted with the lab results as soon as they are available. The fastest way to get your results is to activate your My Chart account. Instructions are located on the last page of this paperwork. If you have not heard from [place] regarding the results in 2 weeks, please contact this office.

## 2017-11-10 IMAGING — CR DG CHEST 2V
2 series · 2 of 2 positions shown · non-contrast
Comparison: None.

CLINICAL DATA: Left-sided chest pain and left arm pain.

EXAM:
CHEST  2 VIEW

[chest pa]
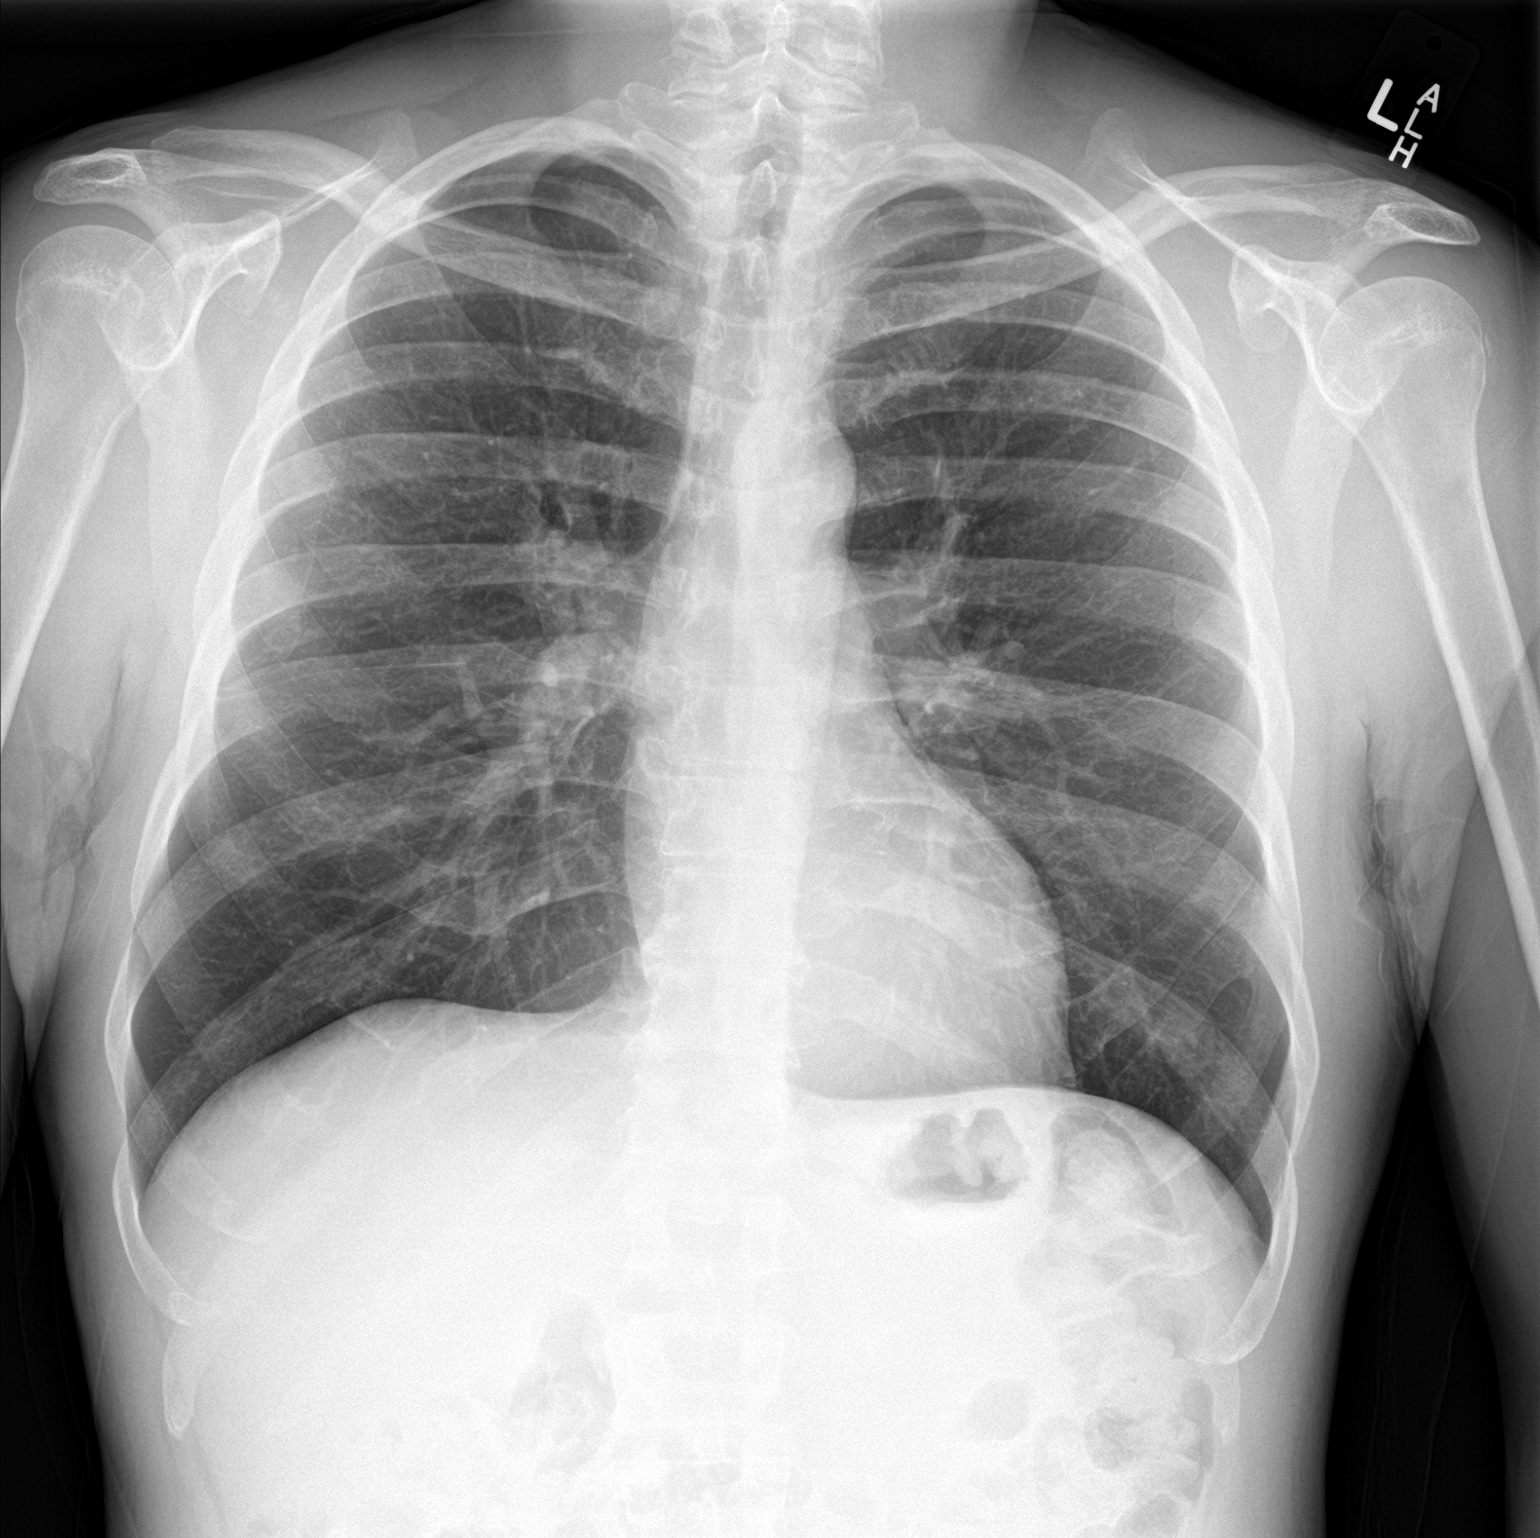

[chest lat]
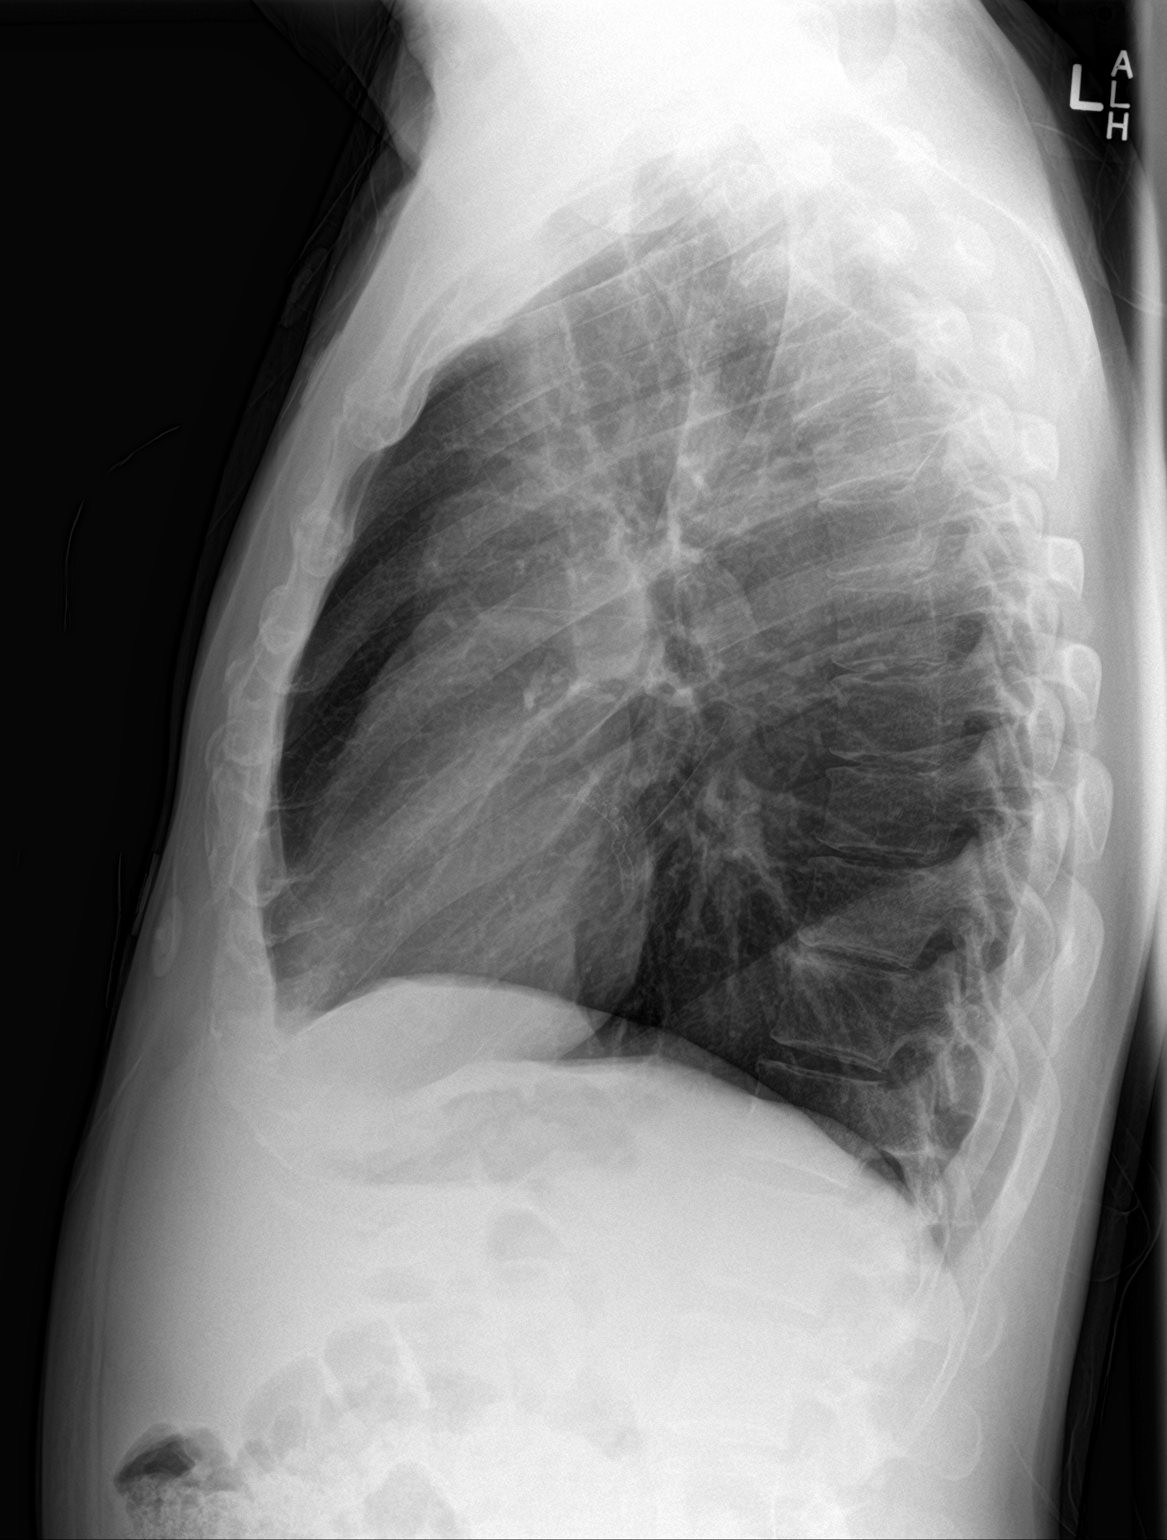

[2 of 2 positions shown; findings below may reference images not displayed]

FINDINGS: The heart size and mediastinal contours are within normal limits.
Both lungs are clear. No pneumothorax. The visualized skeletal
structures are unremarkable.
IMPRESSION: No active cardiopulmonary disease. No significant change from prior.

## 2017-11-12 NOTE — Telephone Encounter (Signed)
Patient had to r/s to 11/19/17 due to he had a loop recorder that he is suppose to get out on 11/15/17,  Re did BC[employer] auth: [id number] (exp. 11/12/17 to 12/11/17)

## 2017-11-19 ENCOUNTER — Ambulatory Visit (INDEPENDENT_AMBULATORY_CARE_PROVIDER_SITE_OTHER): Payer: BC Managed Care – PPO

## 2017-11-19 DIAGNOSIS — I6381 Other cerebral infarction due to occlusion or stenosis of small artery: Secondary | ICD-10-CM

## 2017-11-20 ENCOUNTER — Telehealth: Payer: Self-pay | Admitting: *Deleted

## 2017-11-20 NOTE — Telephone Encounter (Signed)
-----   Message from Burnell Blanks, MD sent at 11/20/2017  9:23 AM EDT ----- Monitor ordered by Neurology. He is my patient. He was seen last month by Lyda Jester, PA-C. I routed his monitor results to Dr. Leonie Man. Can you call the patient also to let him know that his monitor was ok? Thanks, chris

## 2017-11-20 NOTE — Telephone Encounter (Signed)
I placed call to pt and left message to call office.  

## 2017-11-20 NOTE — Telephone Encounter (Signed)
I spoke with pt's wife and reviewed monitor results with her

## 2017-12-05 ENCOUNTER — Other Ambulatory Visit: Payer: Self-pay | Admitting: Family Medicine

## 2017-12-05 NOTE — Telephone Encounter (Signed)
Requested Prescriptions  Pending Prescriptions Disp Refills  . STOOL SOFTENER 100 MG capsule [Pharmacy Med Name: STOOL SOFTENER 100 MG SOFTGEL] 60 capsule 0    Sig: TAKE ONE CAPSULE BY MOUTH TWO TIMES A DAY     Over the Counter:  OTC Passed - 12/05/2017  3:08 PM      Passed - Valid encounter within last 12 months    Recent Outpatient Visits          1 month ago Type 2 diabetes mellitus with other neurologic complication, with long-term current use of insulin ([place])   [facility], Ranell Patrick, MD   2 months ago Type 2 diabetes mellitus with chronic kidney disease on chronic dialysis, with long-term current use of insulin [facility])   [facility] at [facility], Ranell Patrick, MD   3 months ago Type 2 diabetes mellitus with chronic kidney disease on chronic dialysis, with long-term current use of insulin [facility])   [facility], Ranell Patrick, MD   3 months ago ESRD (end stage renal disease) on dialysis [facility])   [facility] at [place], MD   3 months ago ESRD on dialysis [facility])   [facility], Ranell Patrick, MD      Future Appointments            In 5 days [name], MD Primary Care at [place], [facility]   In 1 week Melvyn Novas Christena Deem, MD [facility] Pulmonary Care

## 2017-12-10 ENCOUNTER — Encounter: Payer: Self-pay | Admitting: Family Medicine

## 2017-12-10 ENCOUNTER — Ambulatory Visit: Payer: BC Managed Care – PPO | Admitting: Family Medicine

## 2017-12-10 ENCOUNTER — Other Ambulatory Visit: Payer: Self-pay

## 2017-12-10 VITALS — BP 140/70 | HR 71 | Temp 98.1°F

## 2017-12-10 DIAGNOSIS — Z992 Dependence on renal dialysis: Secondary | ICD-10-CM

## 2017-12-10 DIAGNOSIS — Z1211 Encounter for screening for malignant neoplasm of colon: Secondary | ICD-10-CM

## 2017-12-10 DIAGNOSIS — E1122 Type 2 diabetes mellitus with diabetic chronic kidney disease: Secondary | ICD-10-CM | POA: Diagnosis not present

## 2017-12-10 DIAGNOSIS — Z794 Long term (current) use of insulin: Secondary | ICD-10-CM

## 2017-12-10 DIAGNOSIS — N186 End stage renal disease: Secondary | ICD-10-CM

## 2017-12-10 NOTE — Progress Notes (Addendum)
Subjective:  By signing my name below, I, Jeffery Young, attest that this documentation has been prepared under the direction and in the presence of Jeffery Agreste, MD Electronically Signed: Ladene Artist, ED Scribe 12/10/2017 at 11:11 AM.   Patient ID: Jeffery Young, male    DOB: 1965-11-14, 52 y.o.   MRN: [id number]  Chief Complaint  Patient presents with  . Diabetes    1 m f/u    HPI Jeffery Young is a 52 y.o. male who presents to Primary Care at [facility] for f/u. Last seen 10/1.  DM Lab Results  Component Value Date   HGBA1C 6.8 (H) 09/05/2017  Referred to endocrinology due to variability in readings in Aug. Did have some low readings a few months ago. Lantus was temporarily stopped and has not required sliding scale as of last OV. 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 - Pt's wife states that he has needed 2 units of sliding scale a handful of times in October, due to readings of 152 and 1 reading in the range of 180-200. Wife states that she has been strictly monitoring his diet. Pt reports A1C of 6.2 at the kidney center last wk. Denies any hypoglycemic readings or symptoms.  Health Maintenance Colon CA Screening: pt agrees to colonoscopy Pneumonia vaccine: pt is unsure if he has received this Flu vaccine: also unsure if he received this  Patient Active Problem List   Diagnosis Date Noted  . COPD GOLD III  10/24/2017  . CAD (coronary artery disease), native coronary artery 10/10/2017  . ESRD (end stage renal disease) on dialysis ([place]) 06/25/2017  . Uncontrolled hypertension 05/28/2017  . Depression 05/28/2017  . Normocytic anemia 05/28/2017  . History of completed stroke 05/28/2017  . Syncope 03/13/2016  . Ischemic cardiomyopathy 03/13/2016  . Unstable angina pectoris ([place]) 01/11/2016  . Hypertensive heart disease with heart failure ([place])   . Chronic combined systolic and diastolic heart failure ([place])   . Elevated troponin   . Chest pain 01/10/2016  .  Coronary artery disease due to lipid rich plaque 01/10/2016  . Status post coronary artery stent placement   . Type 2 diabetes mellitus with diabetic nephropathy, without long-term current use of insulin ([place])   . Tobacco abuse   . Essential hypertension   . Hypercholesteremia   . NSTEMI (non-ST elevated myocardial infarction) ([place]) 12/22/2015  . Lightheadedness 08/22/2012  . Dehydration 08/22/2012   Past Medical History:  Diagnosis Date  . Chest pain 01/10/2016  . Chronic combined systolic and diastolic heart failure ([place])   . Coronary artery disease    a.12/22/15: NSTEMI s/p overlapping DES x2 and balloon angioplasty to distal AV groove Circumflex (too small for a stent).   . Coronary artery disease due to lipid rich plaque 01/10/2016  . Dehydration 08/22/2012  . Depression   . Elevated troponin   . ESRD (end stage renal disease) on dialysis ([place])   . Essential hypertension   . History of completed stroke 05/28/2017  . Hypercholesteremia   . Hypertension   . Hypertensive heart disease with heart failure ([place])   . Ischemic cardiomyopathy 03/13/2016  . Normocytic anemia 05/28/2017  . NSTEMI (non-ST elevated myocardial infarction) ([place]) 12/22/2015  . Prostate infection   . Status post coronary artery stent placement   . Syncope 03/13/2016  . Tobacco abuse   . Type 2 diabetes mellitus with diabetic nephropathy, without long-term current use of insulin ([place])   . Type II diabetes mellitus ([place])   .  Uncontrolled hypertension 05/28/2017  . Unstable angina pectoris ([place]) 01/11/2016   Past Surgical History:  Procedure Laterality Date  . BASCILIC VEIN TRANSPOSITION Left 06/28/2017   Procedure: LEFT UPPER ARM ARTERIOVENOUS GORTEX-GRAFT PLACEMENT;  Surgeon: Angelia Mould, MD;  Location: [facility];  Service: Vascular;  Laterality: Left;  . CARDIAC CATHETERIZATION N/A 12/22/2015   Procedure: Left Heart Cath and Coronary Angiography;  Surgeon: Burnell Blanks, MD;  Location: [place]  CV LAB;  Service: Cardiovascular;  Laterality: N/A;  . CARDIAC CATHETERIZATION N/A 12/22/2015   Procedure: Coronary Stent Intervention;  Surgeon: Burnell Blanks, MD;  Location: [place] CV LAB;  Service: Cardiovascular;  Laterality: N/A;  . CARDIAC CATHETERIZATION N/A 01/11/2016   Procedure: Left Heart Cath and Coronary Angiography;  Surgeon: Sherren Mocha, MD;  Location: [place] CV LAB;  Service: Cardiovascular;  Laterality: N/A;  . CARDIAC CATHETERIZATION N/A 01/11/2016   Procedure: Intravascular Pressure Wire/FFR Study;  Surgeon: Sherren Mocha, MD;  Location: [facility] CV LAB;  Service: Cardiovascular;  Laterality: N/A;  . CARDIAC CATHETERIZATION N/A 01/11/2016   Procedure: Coronary Stent Intervention;  Surgeon: Sherren Mocha, MD;  Location: [place] CV LAB;  Service: Cardiovascular;  Laterality: N/A;  . CORONARY ANGIOPLASTY WITH STENT PLACEMENT  12/22/2015  . INSERTION OF DIALYSIS CATHETER Right 06/28/2017   Procedure: INSERTION OF DIALYSIS CATHETER RIGHT INTERNAL JUGULAR;  Surgeon: Angelia Mould, MD;  Location: [facility];  Service: Vascular;  Laterality: Right;   No Known Allergies Prior to Admission medications   Medication Sig Start Date End Date Taking? Authorizing Provider  aspirin 81 MG chewable tablet Chew 1 tablet (81 mg total) by mouth daily. 08/15/17  Yes Jeffery Agreste, MD  atorvastatin (LIPITOR) 10 MG tablet Take 1 tablet (10 mg total) by mouth daily. 10/10/17  Yes Lyda Jester M, PA-C  calcitRIOL (ROCALTROL) 0.5 MCG capsule Take 1 capsule (0.5 mcg total) by mouth every Monday, Wednesday, and Friday with hemodialysis. 08/16/17  Yes Jeffery Agreste, MD  carvedilol (COREG) 6.25 MG tablet Take 1 tablet (6.25 mg total) by mouth 2 (two) times daily with a meal. 10/10/17  Yes Lyda Jester M, PA-C  clopidogrel (PLAVIX) 75 MG tablet Take 1 tablet (75 mg total) by mouth daily. 10/10/17  Yes Simmons, Brittainy M, PA-C  fluticasone furoate-vilanterol (BREO  ELLIPTA) 100-25 MCG/INH AEPB Inhale 1 puff into the lungs daily. 10/24/17  Yes Tanda Rockers, MD  insulin aspart (NOVOLOG) 100 UNIT/ML FlexPen Inject 0-10 Units into the skin 3 (three) times daily before meals. Sliding Scale: 0-150: 0u; 151-200: 2u; 201-250: 4u; 251-300: 6u; 301-350: 8u; 351-400:10u. 401+ Give 10u and call MD 10/03/17  Yes Jeffery Agreste, MD  Insulin Pen Needle (PEN NEEDLES) 32G X 4 MM MISC 1 Dose by Does not apply route 3 (three) times daily. Use with sliding scale novolog with meals. 10/03/17  Yes Jeffery Agreste, MD  multivitamin (RENA-VIT) TABS tablet Take 1 tablet by mouth at bedtime. 08/15/17  Yes Jeffery Agreste, MD  nitroGLYCERIN (NITROSTAT) 0.4 MG SL tablet Place 1 tablet (0.4 mg total) under the tongue every 5 (five) minutes x 3 doses as needed for chest pain. 12/26/15  Yes Cheryln Manly, NP  ONE TOUCH LANCETS MISC One Touch [facility] 79G.  Please provide Verio test strips.  To test four times daily. 08/30/17  Yes Jeffery Agreste, MD  sertraline (ZOLOFT) 50 MG tablet Take 1 tablet (50 mg total) by mouth daily. 08/15/17  Yes Jeffery Agreste, MD  sevelamer carbonate (RENVELA) 800 MG tablet Take 1 tablet (800 mg total) by mouth 3 (three) times daily with meals. 08/15/17  Yes Jeffery Agreste, MD  STOOL SOFTENER 100 MG capsule TAKE ONE CAPSULE BY MOUTH TWO TIMES A DAY 12/05/17  Yes Jeffery Agreste, MD  traZODone (DESYREL) 50 MG tablet Take 1 tablet (50 mg total) by mouth at bedtime. 08/15/17  Yes Jeffery Agreste, MD  Nutritional Supplements (FEEDING SUPPLEMENT, NEPRO CARB STEADY,) LIQD Take 237 mLs by mouth 3 (three) times daily as needed (Supplement). Patient not taking: Reported on 12/10/2017 07/04/17   Bonnielee Haff, MD   Social History   Socioeconomic History  . Marital status: Married    Spouse name: Not on file  . Number of children: Not on file  . Years of education: Not on file  . Highest education level: Not on file  Occupational History  .  Occupation: International aid/development worker: [employer]  Social Needs  . Financial resource strain: Not on file  . Food insecurity:    Worry: Not on file    Inability: Not on file  . Transportation needs:    Medical: Not on file    Non-medical: Not on file  Tobacco Use  . Smoking status: Former Smoker    Packs/day: 0.75    Years: 29.00    Pack years: 21.75    Types: Cigarettes    Last attempt to quit: 05/28/2017    Years since quitting: 0.5  . Smokeless tobacco: Never Used  Substance and Sexual Activity  . Alcohol use: Not Currently    Alcohol/week: 7.0 standard drinks    Types: 7 Cans of beer per week    Frequency: Never    Comment: last drink was prior to last hospitalization  . Drug use: No  . Sexual activity: Not Currently  Lifestyle  . Physical activity:    Days per week: Not on file    Minutes per session: Not on file  . Stress: Not on file  Relationships  . Social connections:    Talks on phone: Not on file    Gets together: Not on file    Attends religious service: Not on file    Active member of club or organization: Not on file    Attends meetings of clubs or organizations: Not on file    Relationship status: Not on file  . Intimate partner violence:    Fear of current or ex partner: Not on file    Emotionally abused: Not on file    Physically abused: Not on file    Forced sexual activity: Not on file  Other Topics Concern  . Not on file  Social History Narrative  . Not on file   Review of Systems  Constitutional: Negative for diaphoresis, fatigue and unexpected weight change.  Eyes: Negative for visual disturbance.  Respiratory: Negative for cough, chest tightness and shortness of breath.   Cardiovascular: Negative for chest pain, palpitations and leg swelling.  Gastrointestinal: Negative for abdominal pain and blood in stool.  Neurological: Negative for dizziness, light-headedness and headaches.      Objective:   Physical Exam  Constitutional: He  is oriented to person, place, and time. He appears well-developed and well-nourished.  HENT:  Head: Normocephalic and atraumatic.  Eyes: Pupils are equal, round, and reactive to light. EOM are normal.  Neck: No JVD present. Carotid bruit is not present.  Cardiovascular: Normal rate, regular rhythm and normal heart  sounds.  No murmur heard. Pulmonary/Chest: Effort normal and breath sounds normal. He has no rales.  Musculoskeletal: He exhibits no edema.  Neurological: He is alert and oriented to person, place, and time.  Skin: Skin is warm and dry.  Psychiatric: He has a normal mood and affect.  Vitals reviewed.  Vitals:   12/10/17 1040 12/10/17 1052  BP: (!) 157/73 140/70  Pulse: 71   Temp: 98.1 F (36.7 C)   TempSrc: Oral   SpO2: 96%       Assessment & Plan:  Jeffery Young is a 52 y.o. male Colon cancer screening - Plan: Ambulatory referral to Gastroenterology  - options discussed for screening, chose colonoscopy - will refer to GI.   Type 2 diabetes mellitus with chronic kidney disease on chronic dialysis, with long-term current use of insulin (HCC)  -Recent A1c with Nephrology reportedly 6.2, has not required sufficient sliding scale insulin.  No new medications at this time.  Recheck in 3 months.  Plans to return for FL2 completion if care facility selected.    No orders of the defined types were placed in this encounter.  Patient Instructions   Diabetes level is reassuring, no new meds for now.   If you have not received pneumovax (pneumonia vaccine) or flu shot, I recommend getting those vaccines as soon as possible. Check with the kidney center.   I will refer you to gastroenterology for colon cancer screening.   Follow up in 3 months.   If you have lab work done today you will be contacted with your lab results within the next 2 weeks.  If you have not heard from [place] then please contact us. The fastest way to get your results is to register for My Chart.   IF  you received an x-ray today, you will receive an invoice from [facility]. Please contact [facility] at [phone] with questions or concerns regarding your invoice.   IF you received labwork today, you will receive an invoice from [facility]. Please contact [facility] at [phone] with questions or concerns regarding your invoice.   Our billing staff will not be able to assist you with questions regarding bills from these companies.  You will be contacted with the lab results as soon as they are available. The fastest way to get your results is to activate your My Chart account. Instructions are located on the last page of this paperwork. If you have not heard from [place] regarding the results in 2 weeks, please contact this office.

## 2017-12-10 NOTE — Patient Instructions (Addendum)
Diabetes level is reassuring, no new meds for now.   If you have not received pneumovax (pneumonia vaccine) or flu shot, I recommend getting those vaccines as soon as possible. Check with the kidney center.   I will refer you to gastroenterology for colon cancer screening.   Follow up in 3 months.   If you have lab work done today you will be contacted with your lab results within the next 2 weeks.  If you have not heard from [place] then please contact us. The fastest way to get your results is to register for My Chart.   IF you received an x-ray today, you will receive an invoice from [facility]. Please contact [facility] at [phone] with questions or concerns regarding your invoice.   IF you received labwork today, you will receive an invoice from [facility]. Please contact [facility] at [phone] with questions or concerns regarding your invoice.   Our billing staff will not be able to assist you with questions regarding bills from these companies.  You will be contacted with the lab results as soon as they are available. The fastest way to get your results is to activate your My Chart account. Instructions are located on the last page of this paperwork. If you have not heard from [place] regarding the results in 2 weeks, please contact this office.

## 2017-12-16 ENCOUNTER — Telehealth: Payer: Self-pay

## 2017-12-16 NOTE — Telephone Encounter (Signed)
Rn call patients wife on dpr. Rn stated the MRI of neck shows no significant narrowing of the carotid or vertebral arteries in the neck. No new or worrisome findings. The wife verbalized understanding.  RN call wife on dpr about MR brain, and MR head. 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 MRA of the head shows mild narrowing of multiple different blood vessels in the front and back of the brain. The wife verbalized understanding.

## 2017-12-16 NOTE — Telephone Encounter (Signed)
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  MRA of the brain shows mild narrowing of multiple different blood vessels in the front and back of the brain.  MRA of the neck shows no significant narrowing of the carotid or vertebral arteries in the neck.  No new or worrisome finding

## 2017-12-17 ENCOUNTER — Encounter: Payer: Self-pay | Admitting: Gastroenterology

## 2017-12-17 ENCOUNTER — Ambulatory Visit (INDEPENDENT_AMBULATORY_CARE_PROVIDER_SITE_OTHER): Payer: BC Managed Care – PPO | Admitting: Internal Medicine

## 2017-12-17 ENCOUNTER — Encounter: Payer: Self-pay | Admitting: Internal Medicine

## 2017-12-17 ENCOUNTER — Telehealth: Payer: Self-pay

## 2017-12-17 VITALS — BP 130/86 | HR 68 | Ht 62.0 in | Wt 130.0 lb

## 2017-12-17 DIAGNOSIS — J449 Chronic obstructive pulmonary disease, unspecified: Secondary | ICD-10-CM | POA: Diagnosis not present

## 2017-12-17 LAB — PULMONARY FUNCTION TEST
FEF 25-75 POST: 0.76 L/s
FEF 25-75 Pre: 0.73 L/sec
FEF2575-%Change-Post: 3 %
FEF2575-%PRED-POST: 27 %
FEF2575-%Pred-Pre: 26 %
FEV1-%Change-Post: 0 %
FEV1-%Pred-Post: 41 %
FEV1-%Pred-Pre: 41 %
FEV1-Post: 1.23 L
FEV1-Pre: 1.23 L
FEV1FVC-%CHANGE-POST: -4 %
FEV1FVC-%Pred-Pre: 80 %
FEV6-%CHANGE-POST: 5 %
FEV6-%Pred-Post: 57 %
FEV6-%Pred-Pre: 54 %
FEV6-Post: 2.06 L
FEV6-Pre: 1.96 L
FEV6FVC-%Pred-Post: 104 %
FEV6FVC-%Pred-Pre: 104 %
FVC-%Change-Post: 5 %
FVC-%PRED-POST: 54 %
FVC-%PRED-PRE: 51 %
FVC-POST: 2.06 L
FVC-PRE: 1.96 L
POST FEV1/FVC RATIO: 60 %
PRE FEV1/FVC RATIO: 63 %
Post FEV6/FVC ratio: 100 %
Pre FEV6/FVC Ratio: 100 %

## 2017-12-17 NOTE — Progress Notes (Signed)
Jeffery Young, male    DOB: Jan 13, 1966    MRN: [id number]    Brief patient profile: 52 yobm quit smoking 05/2017 w/c bound p cva since 2018  With GOLD III criteria for COPD 12/17/2017     History of Present Illness  10/24/2017  Pulmonary/ Initial pulmonary eval/ Gianni Mihalik  Chief Complaint  Patient presents with  . Pulmonary Consult    Referred by Dr. Chilton Si. Pt states he his here for abnormal cxr. He states he has had a cough for 20 yrs. He occ will produce some green sputum.    Dyspnea:  Room to room ok with walker 2 wheels ok but always uses w/c to go out x one year  Cough: some rattling x years not really better since quit smoking  Sleep: flat/ one pillow  SABA use: none  02 none  rec BREO 100 one puff each am to see whether this helps or not and if it does then fill it, if not don't  Please schedule a follow up office visit in 6 weeks, call sooner if needed with PFT's on return    12/17/2017  f/u ov/Sherrice [place] re:  COPD GOLD III/ Breo - no change since started  Chief Complaint  Patient presents with  . Follow-up    PFT's done today. Pt states his breathing is doing well and denies any co's.  He does not have a rescue inhaler.   Dyspnea:  Limited by balalnce and strength> sob  Cough: rattling  Sleeping: flat bed/ one pillow  SABA use: none 02: none    No obvious day to day or daytime variability or assoc excess/ purulent sputum or mucus plugs or hemoptysis or cp or chest tightness, subjective wheeze or overt sinus or hb symptoms.   Sleeping as above without nocturnal  or early am exacerbation  of respiratory  c/o's or need for noct saba. Also denies any obvious fluctuation of symptoms with weather or environmental changes or other aggravating or alleviating factors except as outlined above   No unusual exposure hx or h/o childhood pna/ asthma or knowledge of premature birth.  Current Allergies, Complete Past Medical History, Past Surgical History, Family History, and Social History were  reviewed in [employer] record.  ROS  The following are not active complaints unless bolded Hoarseness, sore throat, dysphagia, dental problems, itching, sneezing,  nasal congestion or discharge of excess mucus or purulent secretions, ear ache,   fever, chills, sweats, unintended wt loss or wt gain, classically pleuritic or exertional cp,  orthopnea pnd or arm/hand swelling  or leg swelling, presyncope, palpitations, abdominal pain, anorexia, nausea, vomiting, diarrhea  or change in bowel habits or change in bladder habits, change in stools or change in urine, dysuria, hematuria,  rash, arthralgias, visual complaints, headache, numbness, weakness or ataxia or problems with walking or coordination,  change in mood or  memory.        Current Meds  Medication Sig  . aspirin 81 MG chewable tablet Chew 1 tablet (81 mg total) by mouth daily.  Marland Kitchen atorvastatin (LIPITOR) 10 MG tablet Take 1 tablet (10 mg total) by mouth daily.  . calcitRIOL (ROCALTROL) 0.5 MCG capsule Take 1 capsule (0.5 mcg total) by mouth every Monday, Wednesday, and Friday with hemodialysis.  Marland Kitchen carvedilol (COREG) 6.25 MG tablet Take 1 tablet (6.25 mg total) by mouth 2 (two) times daily with a meal.  . clopidogrel (PLAVIX) 75 MG tablet Take 1 tablet (75 mg total) by mouth daily.  Marland Kitchen  fluticasone furoate-vilanterol (BREO ELLIPTA) 100-25 MCG/INH AEPB Inhale 1 puff into the lungs daily.  . insulin aspart (NOVOLOG) 100 UNIT/ML FlexPen Inject 0-10 Units into the skin 3 (three) times daily before meals. Sliding Scale: 0-150: 0u; 151-200: 2u; 201-250: 4u; 251-300: 6u; 301-350: 8u; 351-400:10u. 401+ Give 10u and call MD  . Insulin Pen Needle (PEN NEEDLES) 32G X 4 MM MISC 1 Dose by Does not apply route 3 (three) times daily. Use with sliding scale novolog with meals.  . multivitamin (RENA-VIT) TABS tablet Take 1 tablet by mouth at bedtime.  . nitroGLYCERIN (NITROSTAT) 0.4 MG SL tablet Place 1 tablet (0.4 mg total) under the  tongue every 5 (five) minutes x 3 doses as needed for chest pain.  . Nutritional Supplements (FEEDING SUPPLEMENT, NEPRO CARB STEADY,) LIQD Take 237 mLs by mouth 3 (three) times daily as needed (Supplement).  . ONE TOUCH LANCETS MISC One Touch Delica Lancet 33g.  Please provide Verio test strips.  To test four times daily.  . sertraline (ZOLOFT) 50 MG tablet Take 1 tablet (50 mg total) by mouth daily.  . sevelamer carbonate (RENVELA) 800 MG tablet Take 1 tablet (800 mg total) by mouth 3 (three) times daily with meals.  . STOOL SOFTENER 100 MG capsule TAKE ONE CAPSULE BY MOUTH TWO TIMES A DAY  . traZODone (DESYREL) 50 MG tablet Take 1 tablet (50 mg total) by mouth at bedtime.                                Objective:    W/c bound  PR male  nad    12/17/2017      130   12/17/17 130 lb (59 kg)  10/24/17 126 lb (57.2 kg)  10/15/17 118 lb (53.5 kg)     Vital signs reviewed - Note on arrival 02 sats  96% on RA            HEENT: Full dentures/ nl  turbinates bilaterally, and oropharynx. Nl external ear canals without cough reflex   NECK :  without JVD/Nodes/TM/ nl carotid upstrokes bilaterally   LUNGS: no acc muscle use,  Nl contour chest min insp/exp rhonchi  bilaterally without cough on insp or exp maneuvers   CV:  RRR  no s3 or murmur or increase in P2, and no edema   ABD:  soft and nontender with nl inspiratory excursion in the supine position. No bruits or organomegaly appreciated, bowel sounds nl  MS:   ext warm without deformities, calf tenderness, cyanosis or clubbing No obvious joint restrictions   SKIN: warm and dry without lesions    NEURO:  alert, approp, nl sensorium               I personally reviewed images and agree with radiology impression as follows:  CXR:    10/03/17 No active cardiopulmonary disease.         Assessment

## 2017-12-17 NOTE — Patient Instructions (Addendum)
Stop breo to see if you notice any worsening of cough or breathing then  ok to start back up    Pulmonary follow up is as needed  ? Needs swallowing eval /

## 2017-12-17 NOTE — Telephone Encounter (Signed)
-----   Message from Garvin Fila, MD sent at 12/13/2017 12:25 PM EST ----- Kindly inform the patient that cardiac event monitor report did not show any definite evidence of atrial fibrillation or any other worrisome finding

## 2017-12-17 NOTE — Progress Notes (Signed)
PFT completed today.  

## 2017-12-17 NOTE — Telephone Encounter (Signed)
Left vm for patients wife on dpr to call back about results.

## 2017-12-18 ENCOUNTER — Encounter: Payer: Self-pay | Admitting: Internal Medicine

## 2017-12-18 NOTE — Assessment & Plan Note (Addendum)
Spirometry 10/24/2017  FEV1 1.3 (43%)  Ratio 67 with mild curvature  - 10/24/2017  After extensive coaching inhaler device,  effectiveness =    90% with dpi > try BREO 676 one click each am    - PFT's  12/17/2017  FEV1 1.23 (41 % ) ratio 60  p 0 % improvement from saba p ? Breo 100  prior to study (inconsistent hx)   - 12/17/2017  After extensive coaching inhaler device,  effectiveness =    75% with elipta    I had an extended final summary discussion with the patient and fm  reviewing all relevant studies completed to date and  lasting 15 to 20 minutes of a 25 minute visit on the following issues:    See device teaching which extended face to face time for this visit     Advised  no medication on the market that has proven to alter   the likelihood of progression of this  disease(unlike other chronic medical conditions such as atheroclerosis where we do think we can change the natural hx with risk reducing meds)    Therefore stopping smoking and maintaining abstinence are  the most important aspects of care, not choice of inhalers or for that matter, doctors.   That is to say: treatment other than smoking cessation  is entirely directed by severity of symptoms and focused also on reducing exacerbations, not attempting to change the natural history of the disease.     >>>> Pt not convinced the breo is helping but that's likely because he is too sedentary to tell any benefit.  Ok to try off then restart if notices any changes     >>> Low threshold to do modified barium swallow if cough worsens as suspect may be a risk asp from prev stroke   Pulmonary f/u is prn

## 2017-12-23 ENCOUNTER — Ambulatory Visit: Payer: BC Managed Care – PPO | Admitting: Neurology

## 2017-12-26 ENCOUNTER — Encounter: Payer: Self-pay | Admitting: Neurology

## 2017-12-26 ENCOUNTER — Ambulatory Visit (INDEPENDENT_AMBULATORY_CARE_PROVIDER_SITE_OTHER): Payer: BC Managed Care – PPO | Admitting: Neurology

## 2017-12-26 VITALS — BP 118/71 | HR 75 | Ht 62.0 in | Wt 130.2 lb

## 2017-12-26 DIAGNOSIS — G3184 Mild cognitive impairment, so stated: Secondary | ICD-10-CM | POA: Diagnosis not present

## 2017-12-26 NOTE — Patient Instructions (Addendum)
I had a long d/w patient and his daughter about his recent stroke, memory loss,, risk for recurrent stroke/TIAs, personally independently reviewed imaging studies and stroke evaluation results and answered questions.Continue aspirin and plavix for secondary stroke prevention given h/o cardiac stents and maintain strict control of hypertension with blood pressure goal below 130/90, diabetes with hemoglobin A1c goal below 6.5% and lipids with LDL cholesterol goal below 70 mg/dL. I also advised the patient to eat a healthy diet with plenty of whole grains, cereals, fruits and vegetables, exercise regularly and maintain ideal body weight . We discussed memory compensation strategies and fall prevention as well. He was advised to use his walker at all times.Followup in the future with my nurse practitioner  Janett Billow in 6 months. Memory Compensation Strategies  1. Use "WARM" strategy.  W= write it down  A= associate it  R= repeat it  M= make a mental note  2.   You can keep a Social worker.  Use a 3-ring notebook with sections for the following: calendar, important names and phone numbers,  medications, doctors' names/phone numbers, lists/reminders, and a section to journal what you did  each day.   3.    Use a calendar to write appointments down.   4.    Write yourself a schedule for the day.  This can be placed on the calendar or in a separate section of the Memory Notebook.  Keeping a  regular schedule can help memory.  5.    Use medication organizer with sections for each day or morning/evening pills.  You may need help loading it  6.    Keep a basket, or pegboard by the door.  Place items that you need to take out with you in the basket or on the pegboard.  You may also want to  include a message board for reminders.  7.    Use sticky notes.  Place sticky notes with reminders in a place where the task is performed.  For example: " turn off the  stove" placed by the stove, "lock the door"  placed on the door at eye level, " take your medications" on  the bathroom mirror or by the place where you normally take your medications.  8.    Use alarms/timers.  Use while cooking to remind yourself to check on food or as a reminder to take your medicine, or as a  reminder to make a call, or as a reminder to perform another task, etc.

## 2017-12-26 NOTE — Progress Notes (Signed)
[facility] [address] [place]. [place] [address] [phone]       OFFICE CONSULT NOTE  Jeffery. Jeffery Young Date of Birth:  [birth year] Medical Record Number:  [id number]   Referring MD:  Jamison Neighbor Reason for Referral:  Stroke  HPI: Initial visit 10/15/2017 :Jeffery Young is a 68 year African-American male seen today for initial office consultation visit for stroke. He is accompanied by his son. History is obtained from them and review of provided medical records and have personally reviewed imaging films in PACS.the patient is a poor historian and does not remember great details. His son and chips in. About 3 months ago the patient noticed slurred speech, gait and balance difficulties. He is not entirely sure how many days later but eventually had a CT scan of the head on 07/04/17 which I personally reviewed shows age  indeterminate small right pontine and bilateral basal ganglia lacunar infarcts with mild degree of generalized cerebral atrophy. The patient states he used to smoke 1 pack per day for 25 years and has quit since then. He has history of coronary artery disease and stents and has been taking aspirin and Plavix regularly. The patient has had some trouble with balance and has just finished outpatient physical outpatient therapy. He has started using a walker for last 2 months. States is careful to his left leg does track. He is had no falls or injuries fortunately. Patient had lab work done on 10/03/17 which showed LDL cholesterol of 59 mg percent. Hemoglobin A1c was 6.8 on 09/05/17. He has not had an MRI,echocardiogram or vascular imaging studies done. He denies any known prior history of strokes or TIAs. Update 12/26/2017 : He returns for follow-up today accompanied by his daughter.  He continues to have gait and balance difficulties.  He is able to walk with a walker indoors and uses a wheelchair for long distance and outside.  He has finished physical and occupational  therapy.  He has quit smoking.  He did undergo outpatient MRI scan of the brain on 11/20/2017 which showed remote age bilateral basal ganglia, thalamus and brainstem infarcts without any acute finding.  MRA of the neck showed no significant extracranial stenosis.  MRI of the brain showed diffuse intracranial atherosclerotic changes in anterior circulation.  Transthoracic echocardiogram showed normal ejection fraction without cardiac source of embolism.  External cardiac monitoring was negative for any arrhythmias.  Patient has a new complaint of memory difficulties which she has had since his stroke.  He has trouble remembering recent information and at times making new memories.  This appears to be constant and does not appear to be getting worse day by day.  He remains on aspirin and Plavix which is tolerating well without bleeding or bruising.  His blood pressure is well controlled and today it is 118/71.  He states his sugars are doing well.  He is tolerating Lipitor well without muscle aches and pains. ROS:   14 system review of systems is positive for  Gait difficulty, leg weakness, , memory loss, joint pain, excessive thirst, activity change, chills and all other systems negative  PMH:  Past Medical History:  Diagnosis Date  . Chest pain 01/10/2016  . Chronic combined systolic and diastolic heart failure ([place])   . Coronary artery disease    a.12/22/15: NSTEMI s/p overlapping DES x2 and balloon angioplasty to distal AV groove Circumflex (too small for a stent).   . Coronary artery disease due to lipid rich plaque 01/10/2016  .  Dehydration 08/22/2012  . Depression   . Elevated troponin   . ESRD (end stage renal disease) on dialysis ([place])   . Essential hypertension   . History of completed stroke 05/28/2017  . Hypercholesteremia   . Hypertension   . Hypertensive heart disease with heart failure ([place])   . Ischemic cardiomyopathy 03/13/2016  . Normocytic anemia 05/28/2017  . NSTEMI (non-ST elevated  myocardial infarction) ([place]) 12/22/2015  . Prostate infection   . Status post coronary artery stent placement   . Syncope 03/13/2016  . Tobacco abuse   . Type 2 diabetes mellitus with diabetic nephropathy, without long-term current use of insulin ([place])   . Type II diabetes mellitus ([place])   . Uncontrolled hypertension 05/28/2017  . Unstable angina pectoris ([place]) 01/11/2016    Social History:  Social History   Socioeconomic History  . Marital status: Married    Spouse name: Not on file  . Number of children: Not on file  . Years of education: Not on file  . Highest education level: Not on file  Occupational History  . Occupation: International aid/development worker: [employer]  Social Needs  . Financial resource strain: Not on file  . Food insecurity:    Worry: Not on file    Inability: Not on file  . Transportation needs:    Medical: Not on file    Non-medical: Not on file  Tobacco Use  . Smoking status: Former Smoker    Packs/day: 0.75    Years: 29.00    Pack years: 21.75    Types: Cigarettes    Last attempt to quit: 05/28/2017    Years since quitting: 0.5  . Smokeless tobacco: Never Used  Substance and Sexual Activity  . Alcohol use: Not Currently    Alcohol/week: 7.0 standard drinks    Types: 7 Cans of beer per week    Frequency: Never    Comment: last drink was prior to last hospitalization  . Drug use: No  . Sexual activity: Not Currently  Lifestyle  . Physical activity:    Days per week: Not on file    Minutes per session: Not on file  . Stress: Not on file  Relationships  . Social connections:    Talks on phone: Not on file    Gets together: Not on file    Attends religious service: Not on file    Active member of club or organization: Not on file    Attends meetings of clubs or organizations: Not on file    Relationship status: Not on file  . Intimate partner violence:    Fear of current or ex partner: Not on file    Emotionally abused: Not on file     Physically abused: Not on file    Forced sexual activity: Not on file  Other Topics Concern  . Not on file  Social History Narrative  . Not on file    Medications:   Current Outpatient Medications on File Prior to Visit  Medication Sig Dispense Refill  . aspirin 81 MG chewable tablet Chew 1 tablet (81 mg total) by mouth daily. 90 tablet 1  . atorvastatin (LIPITOR) 10 MG tablet Take 1 tablet (10 mg total) by mouth daily. 90 tablet 2  . calcitRIOL (ROCALTROL) 0.5 MCG capsule Take 1 capsule (0.5 mcg total) by mouth every Monday, Wednesday, and Friday with hemodialysis. 30 capsule 1  . carvedilol (COREG) 6.25 MG tablet Take 1 tablet (6.25 mg total)  by mouth 2 (two) times daily with a meal. 180 tablet 2  . clopidogrel (PLAVIX) 75 MG tablet Take 1 tablet (75 mg total) by mouth daily. 90 tablet 2  . fluticasone furoate-vilanterol (BREO ELLIPTA) 100-25 MCG/INH AEPB Inhale 1 puff into the lungs daily. 60 each 11  . insulin aspart (NOVOLOG) 100 UNIT/ML FlexPen Inject 0-10 Units into the skin 3 (three) times daily before meals. Sliding Scale: 0-150: 0u; 151-200: 2u; 201-250: 4u; 251-300: 6u; 301-350: 8u; 351-400:10u. 401+ Give 10u and call MD 15 mL 3  . Insulin Pen Needle (PEN NEEDLES) 32G X 4 MM MISC 1 Dose by Does not apply route 3 (three) times daily. Use with sliding scale novolog with meals. 90 each 3  . multivitamin (RENA-VIT) TABS tablet Take 1 tablet by mouth at bedtime. 90 tablet 1  . nitroGLYCERIN (NITROSTAT) 0.4 MG SL tablet Place 1 tablet (0.4 mg total) under the tongue every 5 (five) minutes x 3 doses as needed for chest pain. 25 tablet 3  . ONE TOUCH LANCETS MISC One Touch Delica Lancet 14N.  Please provide Verio test strips.  To test four times daily. 100 each 3  . sertraline (ZOLOFT) 50 MG tablet Take 1 tablet (50 mg total) by mouth daily. 90 tablet 1  . sevelamer carbonate (RENVELA) 800 MG tablet Take 1 tablet (800 mg total) by mouth 3 (three) times daily with meals. 90 tablet 1  .  STOOL SOFTENER 100 MG capsule TAKE ONE CAPSULE BY MOUTH TWO TIMES A DAY 60 capsule 0  . traZODone (DESYREL) 50 MG tablet Take 1 tablet (50 mg total) by mouth at bedtime. 90 tablet 1   No current facility-administered medications on file prior to visit.     Allergies:  No Known Allergies  Physical Exam General: Frail middle-aged Hispanic male, seated, in no evident distress Head: head normocephalic and atraumatic.   Neck: supple with no carotid or supraclavicular bruits Cardiovascular: regular rate and rhythm, no murmurs Musculoskeletal: no deformity Skin:  no rash/petichiae Vascular:  Normal pulses all extremities  Neurologic Exam Mental Status: Awake and fully alert. Oriented to place and time. Recent and remote memory intact. Attention span, concentration and fund of knowledge appropriate. Mood and affect appropriate.  Poor recall 0/3.  Able to name 7 animals with 4 legs.x.  Clock drawing 3/4. Cranial Nerves: Fundoscopic exam reveals sharp disc margins on right and left disc not visualized. Pupils equal, briskly reactive to light on the right and sluggish on the left. Left eye vision activities limited only to light perception.. Extraocular movements full without nystagmus but has exotropia of the left eye. Visual fields full to confrontation in right eye only Hearing intact. Facial sensation intact. Face, tongue, palate moves normally and symmetrically.  Motor: mild left hemiparesis 4/5 strength with weakness of left grip and intrinsic hand muscles. Orbits right over left Body. Mild left upper extremity drift. Left grip is weak.  Mild weakness of left hip flexors and ankle dorsiflexors. Tone is increased on the left compared to the right.No clonus noted Sensory.: intact to touch , pinprick , position and vibratory sensation.  Coordination: Rapid alternating movements normal in all extremities. Finger-to-nose and heel-to-shin performed accurately bilaterally but slower on the left side. Gait  and Station: Arises from chair without difficulty. Stance is slightly broad-based. Gait demonstrates dragging of the left leg with slight circumduction. Unable to walk tandem Reflexes: 2+ and asymmetric And brisker on the left. Toes downgoing.   NIHSS  3 Modified Rankin  3   ASSESSMENT: 52 year old male with slurred speech, gait difficulty and left-sided weakness from lacunar brainstem and  Bilateral subcortical infarcts likely from May 2019 due to small vessel disease. Vascular risk factors of hypertension, diabetes, hyperlipidemia , coronary artery disease,and smoking.  New complaints of memory difficulties likely due to post stroke mild cognitive impairment     PLAN: I had a long d/w patient and his daughter about his recent stroke, memory loss,, risk for recurrent stroke/TIAs, personally independently reviewed imaging studies and stroke evaluation results and answered questions.Continue aspirin and plavix for secondary stroke prevention given h/o cardiac stents and maintain strict control of hypertension with blood pressure goal below 130/90, diabetes with hemoglobin A1c goal below 6.5% and lipids with LDL cholesterol goal below 70 mg/dL. I also advised the patient to eat a healthy diet with plenty of whole grains, cereals, fruits and vegetables, exercise regularly and maintain ideal body weight . We discussed memory compensation strategies and fall prevention as well. He was advised to use his walker at all times.Followup in the future with my nurse practitioner  Janett Billow in 6 months.Greater than 50% time during this 25 minute visit was spent on counseling and coordination of care about his  Infarcts, memory loss and in talking about stroke prevention and treatment.Followup in the future with me in 2 months or call earlier if necessary Antony Contras, MD  [facility] [address] [place], [place] [address]  Phone [phone] Fax [phone] Note: This  document was prepared with digital dictation and possible smart phrase technology. Any transcriptional errors that result from this process are unintentional.

## 2017-12-29 ENCOUNTER — Other Ambulatory Visit: Payer: Self-pay | Admitting: Family Medicine

## 2017-12-29 DIAGNOSIS — N186 End stage renal disease: Secondary | ICD-10-CM

## 2017-12-29 DIAGNOSIS — Z992 Dependence on renal dialysis: Principal | ICD-10-CM

## 2017-12-30 NOTE — Telephone Encounter (Signed)
Requested medication (s) are due for refill today: yes  Requested medication (s) are on the active medication list: yes  Last refill:  08/16/17 for 30 caps and 1 refil,l  Future visit scheduled: yes  Notes to clinic:  Vitamin D supplementation not delegated.  Requested Prescriptions  Pending Prescriptions Disp Refills   calcitRIOL (ROCALTROL) 0.5 MCG capsule [Pharmacy Med Name: CALCITRIOL 0.5 MCG CAPSULE] 30 capsule 0    Sig: TAKE 1 CAPSULE BY MOUTH ON MONDAY, [place], [place] WITH HEMODIALYSIS     Endocrinology:  Vitamins - Vitamin D Supplementation Failed - 12/29/2017  6:44 AM      Failed - 50,000 IU strengths are not delegated      Failed - Ca in normal range and within 360 days    Calcium  Date Value Ref Range Status  07/04/2017 8.8 (L) 8.9 - 10.3 mg/dL Final   Calcium, Total ([facility]TH)  Date Value Ref Range Status  05/31/2017 8.3 (L) 8.7 - 10.2 mg/dL Final         Failed - Vit D in normal range and within 360 days    No results found for: [id number], [id number], [id number], 25OHVITD3, 25OHVITD2, 25OHVITD3, 25OHVITD2, 25OHVITD1, 25OHVITD2, 25OHVITD3, VD25OH       Passed - Phosphate in normal range and within 360 days    Phosphorus  Date Value Ref Range Status  07/04/2017 4.5 2.5 - 4.6 mg/dL Final         Passed - Valid encounter within last 12 months    Recent Outpatient Visits          2 weeks ago Colon cancer screening   [facility], Ranell Patrick, MD   1 month ago Type 2 diabetes mellitus with other neurologic complication, with long-term current use of insulin [facility])   [facility], Ranell Patrick, MD   2 months ago Type 2 diabetes mellitus with chronic kidney disease on chronic dialysis, with long-term current use of insulin [facility])   [facility], Ranell Patrick, MD   3 months ago Type 2 diabetes mellitus with chronic kidney disease on chronic dialysis, with long-term current use of insulin [facility])   [facility], Ranell Patrick, MD   4 months ago ESRD (end stage renal disease) on dialysis [facility])   [facility], Ranell Patrick, MD      Future Appointments            In 2 months Carlota Raspberry Ranell Patrick, MD [facility] [place], [facility]

## 2018-01-01 ENCOUNTER — Telehealth: Payer: Self-pay | Admitting: *Deleted

## 2018-01-01 NOTE — Telephone Encounter (Signed)
Per patient's chart he is taking Plavix (Clopidogrel). IF he is on this medication he will need an Office visit with PA before his colonoscopy on 01/23/18. Called patient, no answer, left message for him to call us back today before 2 pm.

## 2018-01-01 NOTE — Telephone Encounter (Signed)
Spoke with pt's wife. She states he is on Plavix. OV made with Anderson Malta, Liborio Negron Torres on 01/14/18 @10 :82 am, wife will come with pt.

## 2018-01-08 IMAGING — CT CT HEAD W/O CM
4 series · 17 of 47 positions shown, 19 images · non-contrast
Comparison: 08/21/2012

CLINICAL DATA: Syncope

EXAM:
CT HEAD WITHOUT CONTRAST
TECHNIQUE: Contiguous axial images were obtained from the base of the skull
through the vertex without intravenous contrast.

[Series 2: head without · axial · non-contrast · 0.44mm/px · z∈[-28,+92]mm · 7 of 32 slices shown, 9 images]
[im 4/32  brain]
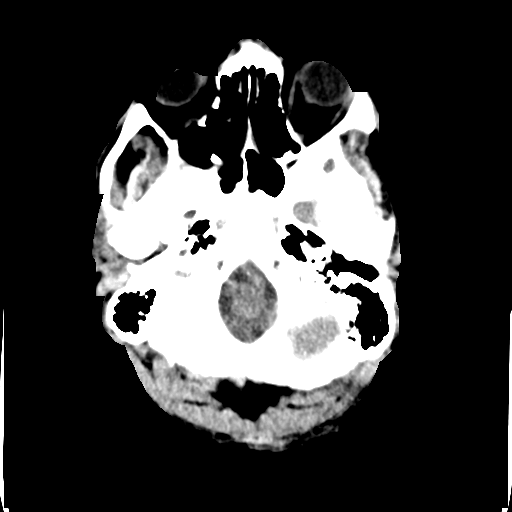
[im 4/32  bone]
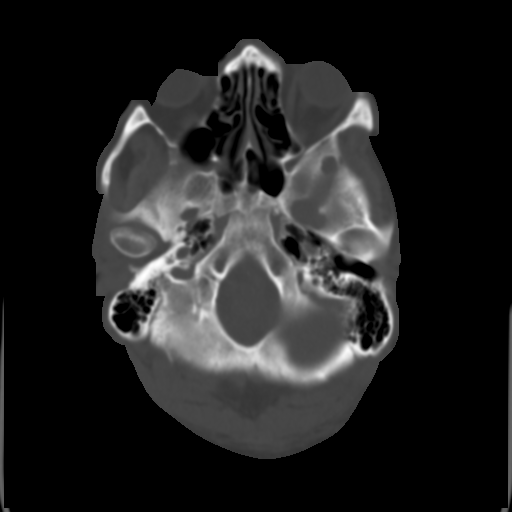
[im 8/32  brain]
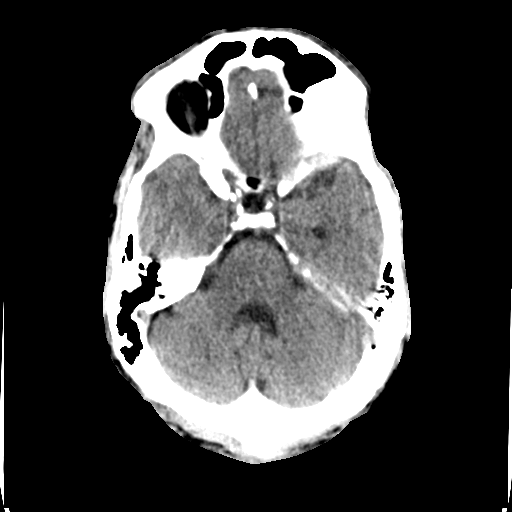
[im 12/32  brain]
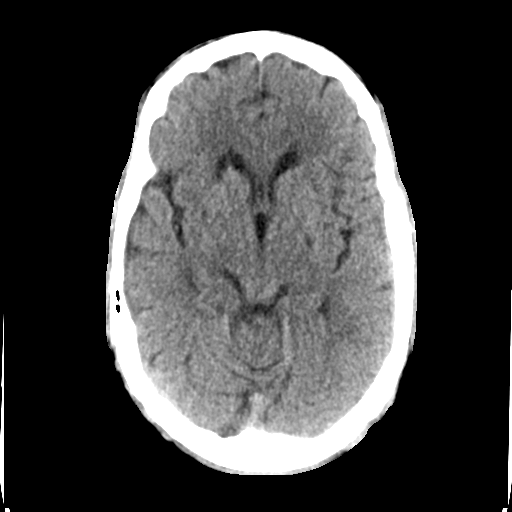
[im 16/32  brain]
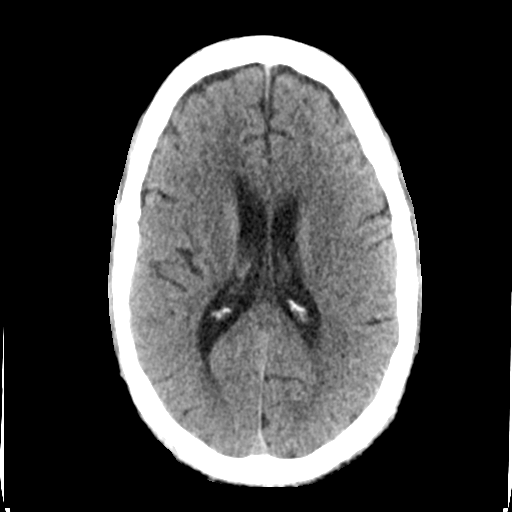
[im 20/32  brain]
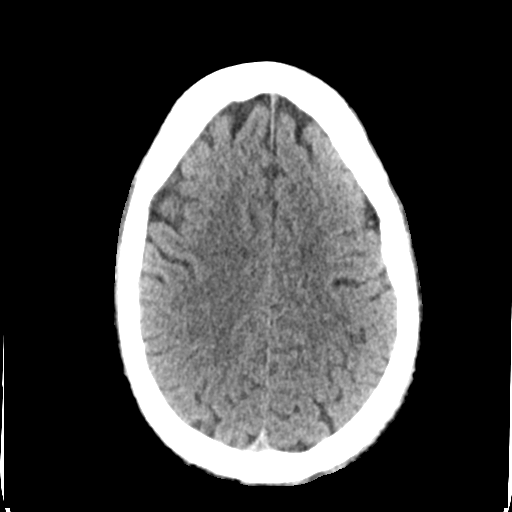
[im 20/32  bone]
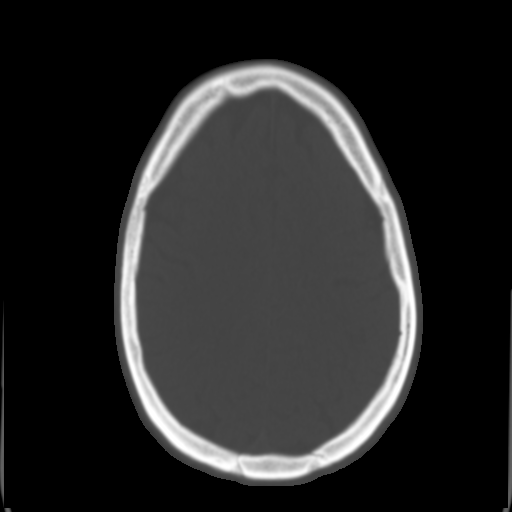
[im 24/32  brain]
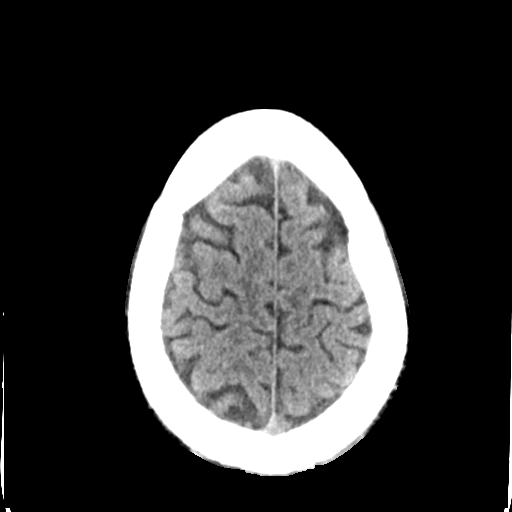
[im 28/32  brain]
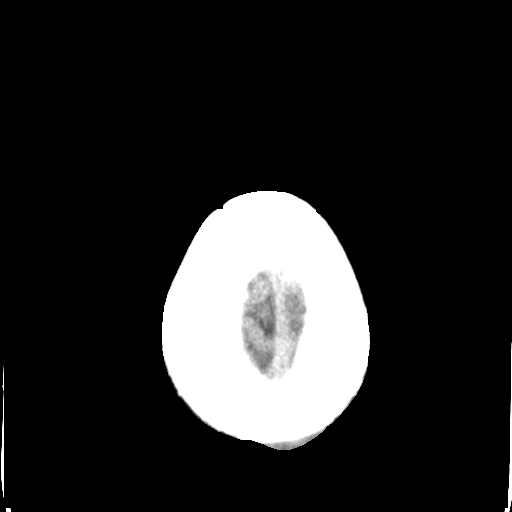

[Series 3: head bone · axial · 0.44mm/px · z∈[-29,+27]mm · 4 of 80 slices shown]
[im 8/80  bone]
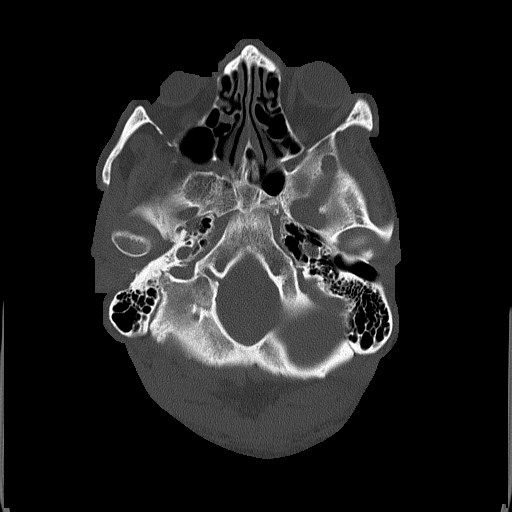
[im 16/80  bone]
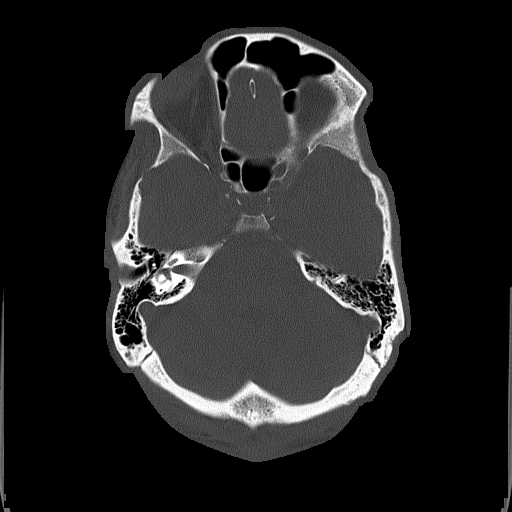
[im 24/80  bone]
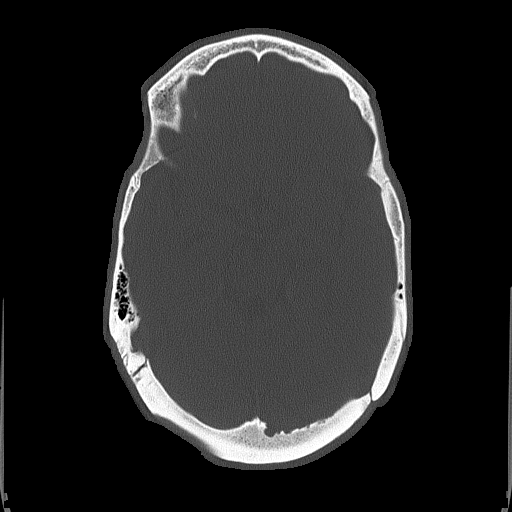
[im 36/80  bone]
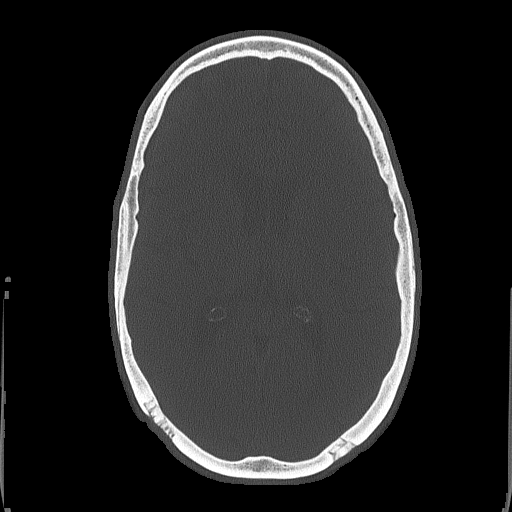

[Series 4: head without cor · coronal · non-contrast · 0.30mm/px · 3 of 67 slices shown]
[im 23/67  brain]
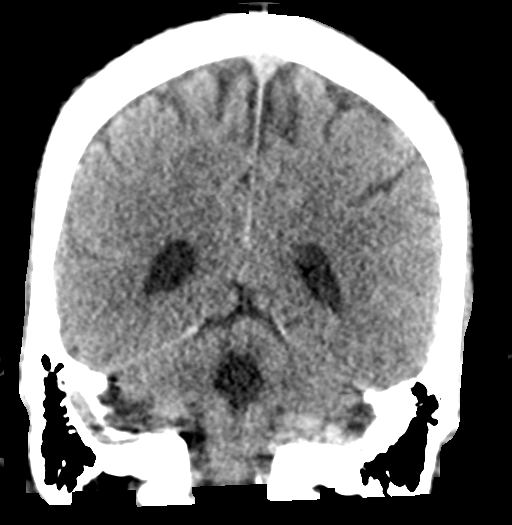
[im 30/67  brain]
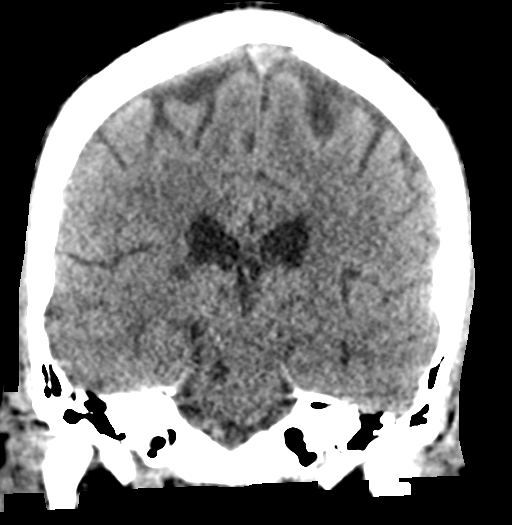
[im 37/67  brain]
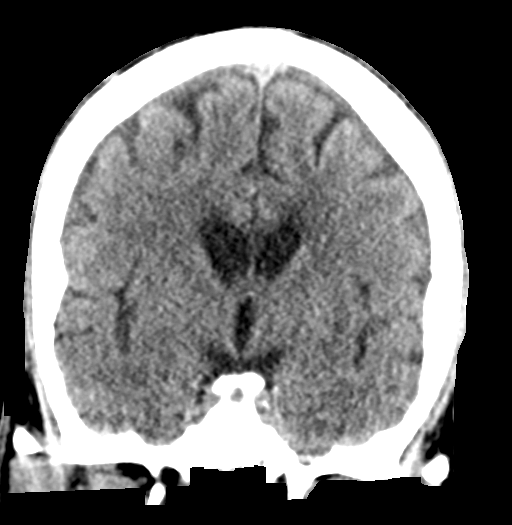

[Series 5: head without sag · sagittal · non-contrast · 0.31mm/px · 3 of 67 slices shown]
[im 23/67  brain]
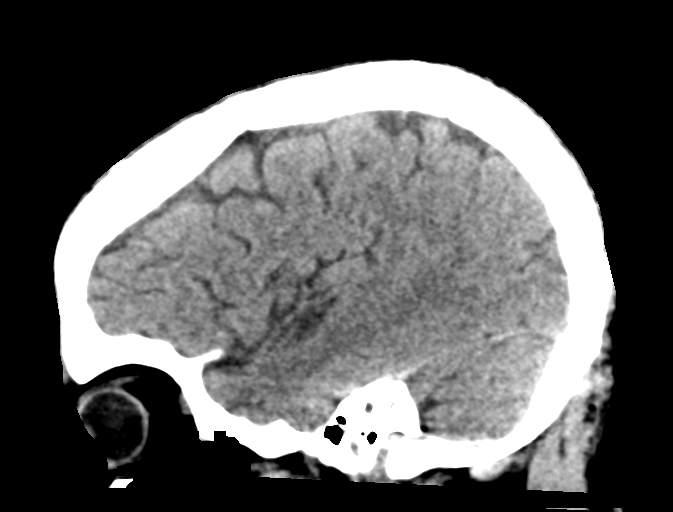
[im 34/67  brain]
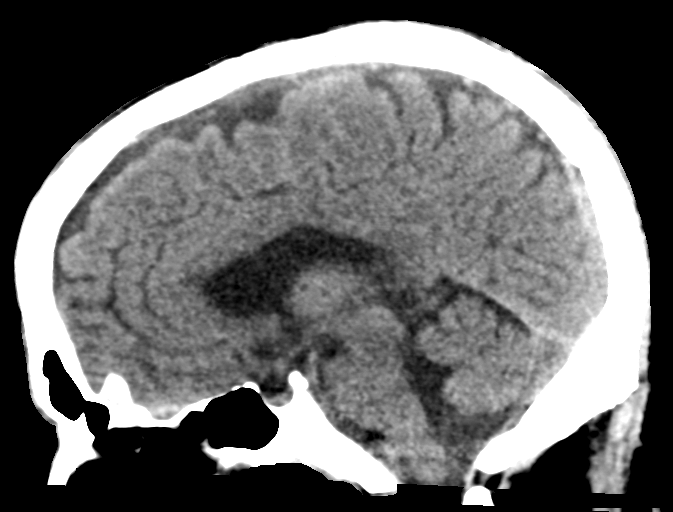
[im 45/67  brain]
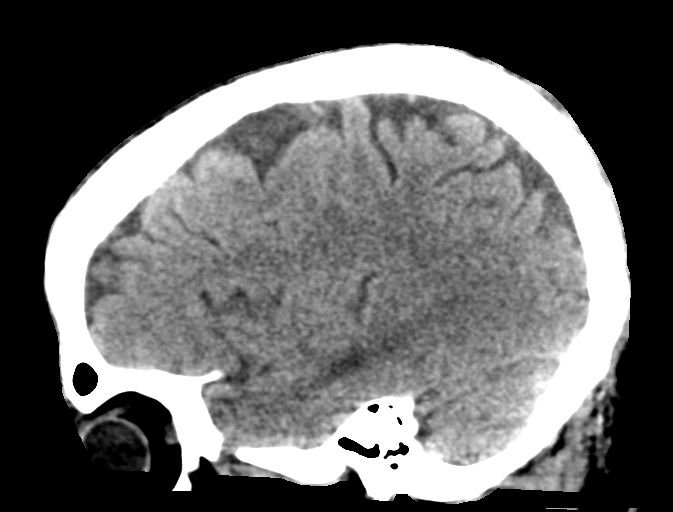

[17 of 47 positions shown; findings below may reference images not displayed]

FINDINGS: Brain: No acute intracranial abnormality. Specifically, no
hemorrhage, hydrocephalus, mass lesion, acute infarction, or
significant intracranial injury.

Vascular: No hyperdense vessel or unexpected calcification.

Skull: No acute calvarial abnormality.

Sinuses/Orbits: Visualized paranasal sinuses and mastoids clear.
Orbital soft tissues unremarkable.

Other: None
IMPRESSION: No acute intracranial abnormality.

## 2018-01-14 ENCOUNTER — Other Ambulatory Visit: Payer: Self-pay | Admitting: *Deleted

## 2018-01-14 ENCOUNTER — Telehealth: Payer: Self-pay | Admitting: *Deleted

## 2018-01-14 ENCOUNTER — Ambulatory Visit (INDEPENDENT_AMBULATORY_CARE_PROVIDER_SITE_OTHER): Payer: BC Managed Care – PPO | Admitting: Physician Assistant

## 2018-01-14 ENCOUNTER — Encounter: Payer: Self-pay | Admitting: Physician Assistant

## 2018-01-14 ENCOUNTER — Telehealth: Payer: Self-pay | Admitting: Family Medicine

## 2018-01-14 VITALS — BP 130/70 | HR 80 | Ht 63.0 in | Wt 130.0 lb

## 2018-01-14 DIAGNOSIS — Z8673 Personal history of transient ischemic attack (TIA), and cerebral infarction without residual deficits: Secondary | ICD-10-CM

## 2018-01-14 DIAGNOSIS — Z1211 Encounter for screening for malignant neoplasm of colon: Secondary | ICD-10-CM

## 2018-01-14 DIAGNOSIS — Z992 Dependence on renal dialysis: Secondary | ICD-10-CM

## 2018-01-14 DIAGNOSIS — Z993 Dependence on wheelchair: Secondary | ICD-10-CM

## 2018-01-14 DIAGNOSIS — Z7901 Long term (current) use of anticoagulants: Secondary | ICD-10-CM

## 2018-01-14 DIAGNOSIS — N186 End stage renal disease: Secondary | ICD-10-CM | POA: Diagnosis not present

## 2018-01-14 MED ORDER — NA SULFATE-K SULFATE-MG SULF 17.5-3.13-1.6 GM/177ML PO SOLN: ORAL | 0 refills | Status: DC

## 2018-01-14 NOTE — Patient Instructions (Signed)
Ellouise Newer PA will speak to Dr. Havery Moros and we will coordinate this once the hospital schedule comes out in February.  We will call with details.   Normal BMI (Body Mass Index- based on height and weight) is between 19 and 25. Your BMI today is Body mass index is 23.03 kg/m. Marland Kitchen Please consider follow up  regarding your BMI with your Primary Care Provider.

## 2018-01-14 NOTE — Progress Notes (Signed)
Chief Complaint: Consultation for a screening colonoscopy in a patient on chronic anticoagulation  HPI:    Jeffery Young is a 52 year old African-American male with a past medical history as listed below including CAD status post DES in 2017 on Plavix (10/17/2017 echo with LVEF 60-65%) as well as stroke in May of this year and on dialysis Monday, Wednesday, Friday, who was referred to me by [name], MD for consultation of a screening colonoscopy on chronic anticoagulation.      Patient is scheduled 01/23/2018 for colonoscopy in the [facility] with Dr. Havery Moros.    Today, the patient presents to clinic accompanied by his sister and son.  Apparently, he lives at home with his sister who does help to take care of him.  She has many concerns about the upcoming procedure given the patient's inability to walk unassisted or transfer himself to a bed.  She is also concerned as he is on dialysis and sometimes does not get home until 7 pm, as well as the fact that he sleeps on air mattress on her floor and it is many steps to the bathroom and she thinks he may not be able to prep at home.  Overall the patient has no GI concerns, no change in bowel habits or abdominal pain.    Family history is negative for colon cancer or colon polyps.    Denies fever, chills, weight loss, anorexia, nausea, vomiting, heartburn, reflux or symptoms that awaken him from sleep.     Past Medical History:  Diagnosis Date  . Chest pain 01/10/2016  . Chronic combined systolic and diastolic heart failure ([place])   . Coronary artery disease    a.12/22/15: NSTEMI s/p overlapping DES x2 and balloon angioplasty to distal AV groove Circumflex (too small for a stent).   . Coronary artery disease due to lipid rich plaque 01/10/2016  . Dehydration 08/22/2012  . Depression   . Elevated troponin   . ESRD (end stage renal disease) on dialysis ([place])   . Essential hypertension   . History of completed stroke 05/28/2017  . Hypercholesteremia     . Hypertension   . Hypertensive heart disease with heart failure ([place])   . Ischemic cardiomyopathy 03/13/2016  . Normocytic anemia 05/28/2017  . NSTEMI (non-ST elevated myocardial infarction) ([place]) 12/22/2015  . Prostate infection   . Status post coronary artery stent placement   . Syncope 03/13/2016  . Tobacco abuse   . Type 2 diabetes mellitus with diabetic nephropathy, without long-term current use of insulin ([place])   . Type II diabetes mellitus ([place])   . Uncontrolled hypertension 05/28/2017  . Unstable angina pectoris ([place]) 01/11/2016    Past Surgical History:  Procedure Laterality Date  . BASCILIC VEIN TRANSPOSITION Left 06/28/2017   Procedure: LEFT UPPER ARM ARTERIOVENOUS GORTEX-GRAFT PLACEMENT;  Surgeon: Angelia Mould, MD;  Location: [facility];  Service: Vascular;  Laterality: Left;  . CARDIAC CATHETERIZATION N/A 12/22/2015   Procedure: Left Heart Cath and Coronary Angiography;  Surgeon: Burnell Blanks, MD;  Location: [facility];  Service: Cardiovascular;  Laterality: N/A;  . CARDIAC CATHETERIZATION N/A 12/22/2015   Procedure: Coronary Stent Intervention;  Surgeon: Burnell Blanks, MD;  Location: [facility];  Service: Cardiovascular;  Laterality: N/A;  . CARDIAC CATHETERIZATION N/A 01/11/2016   Procedure: Left Heart Cath and Coronary Angiography;  Surgeon: Sherren Mocha, MD;  Location: [facility];  Service: Cardiovascular;  Laterality: N/A;  . CARDIAC CATHETERIZATION N/A 01/11/2016  Procedure: Intravascular Pressure Wire/FFR Study;  Surgeon: Sherren Mocha, MD;  Location: [facility];  Service: Cardiovascular;  Laterality: N/A;  . CARDIAC CATHETERIZATION N/A 01/11/2016   Procedure: Coronary Stent Intervention;  Surgeon: Sherren Mocha, MD;  Location: [facility];  Service: Cardiovascular;  Laterality: N/A;  . CORONARY ANGIOPLASTY WITH STENT PLACEMENT  12/22/2015  . INSERTION OF DIALYSIS CATHETER Right 06/28/2017   Procedure:  INSERTION OF DIALYSIS CATHETER RIGHT INTERNAL JUGULAR;  Surgeon: Angelia Mould, MD;  Location[facility];  Service: Vascular;  Laterality: Right;    Current Outpatient Medications  Medication Sig Dispense Refill  . aspirin 81 MG chewable tablet Chew 1 tablet (81 mg total) by mouth daily. 90 tablet 1  . atorvastatin (LIPITOR) 10 MG tablet Take 1 tablet (10 mg total) by mouth daily. 90 tablet 2  . calcitRIOL (ROCALTROL) 0.5 MCG capsule TAKE 1 CAPSULE BY MOUTH ON MONDAY, WEDNESDAY, AND FRIDAY WITH HEMODIALYSIS 30 capsule 0  . carvedilol (COREG) 6.25 MG tablet Take 1 tablet (6.25 mg total) by mouth 2 (two) times daily with a meal. 180 tablet 2  . clopidogrel (PLAVIX) 75 MG tablet Take 1 tablet (75 mg total) by mouth daily. 90 tablet 2  . fluticasone furoate-vilanterol (BREO ELLIPTA) 100-25 MCG/INH AEPB Inhale 1 puff into the lungs daily. 60 each 11  . insulin aspart (NOVOLOG) 100 UNIT/ML FlexPen Inject 0-10 Units into the skin 3 (three) times daily before meals. Sliding Scale: 0-150: 0u; 151-200: 2u; 201-250: 4u; 251-300: 6u; 301-350: 8u; 351-400:10u. 401+ Give 10u and call MD 15 mL 3  . Insulin Pen Needle (PEN NEEDLES) 32G X 4 MM MISC 1 Dose by Does not apply route 3 (three) times daily. Use with sliding scale novolog with meals. 90 each 3  . multivitamin (RENA-VIT) TABS tablet Take 1 tablet by mouth at bedtime. 90 tablet 1  . nitroGLYCERIN (NITROSTAT) 0.4 MG SL tablet Place 1 tablet (0.4 mg total) under the tongue every 5 (five) minutes x 3 doses as needed for chest pain. 25 tablet 3  . ONE TOUCH LANCETS MISC One Touch Delica Lancet 31S.  Please provide Verio test strips.  To test four times daily. 100 each 3  . sertraline (ZOLOFT) 50 MG tablet Take 1 tablet (50 mg total) by mouth daily. 90 tablet 1  . sevelamer carbonate (RENVELA) 800 MG tablet Take 1 tablet (800 mg total) by mouth 3 (three) times daily with meals. 90 tablet 1  . STOOL SOFTENER 100 MG capsule TAKE ONE CAPSULE BY MOUTH TWO TIMES  A DAY 60 capsule 0  . traZODone (DESYREL) 50 MG tablet Take 1 tablet (50 mg total) by mouth at bedtime. 90 tablet 1   No current facility-administered medications for this visit.     Allergies as of 01/14/2018  . (No Known Allergies)    Family History  Problem Relation Age of Onset  . CAD Brother   . CVA Brother   . Chronic Renal Failure Brother 50       on HD  . CAD Brother     Social History   Socioeconomic History  . Marital status: Married    Spouse name: Not on file  . Number of children: Not on file  . Years of education: Not on file  . Highest education level: Not on file  Occupational History  . Occupation: International aid/development worker: [employer]  Social Needs  . Financial resource strain: Not on file  .  Food insecurity:    Worry: Not on file    Inability: Not on file  . Transportation needs:    Medical: Not on file    Non-medical: Not on file  Tobacco Use  . Smoking status: Former Smoker    Packs/day: 0.75    Years: 29.00    Pack years: 21.75    Types: Cigarettes    Last attempt to quit: 05/28/2017    Years since quitting: 0.6  . Smokeless tobacco: Never Used  Substance and Sexual Activity  . Alcohol use: Not Currently    Alcohol/week: 7.0 standard drinks    Types: 7 Cans of beer per week    Frequency: Never    Comment: last drink was prior to last hospitalization  . Drug use: No  . Sexual activity: Not Currently  Lifestyle  . Physical activity:    Days per week: Not on file    Minutes per session: Not on file  . Stress: Not on file  Relationships  . Social connections:    Talks on phone: Not on file    Gets together: Not on file    Attends religious service: Not on file    Active member of club or organization: Not on file    Attends meetings of clubs or organizations: Not on file    Relationship status: Not on file  . Intimate partner violence:    Fear of current or ex partner: Not on file    Emotionally abused: Not on file     Physically abused: Not on file    Forced sexual activity: Not on file  Other Topics Concern  . Not on file  Social History Narrative  . Not on file    Review of Systems:    Constitutional: No weight loss, fever or chills Skin: No rash  Cardiovascular: No chest pain Respiratory: No SOB  Gastrointestinal: See HPI and otherwise negative Genitourinary: No dysuria Neurological: No headache, dizziness or syncope Musculoskeletal: No new muscle or joint pain Hematologic: No bleeding  Psychiatric: No history of depression or anxiety   Physical Exam:  Vital signs: BP 130/70   Pulse 80   Ht 5\' 3"  (1.6 m)   Wt 130 lb (59 kg)   SpO2 98%   BMI 23.03 kg/m   Constitutional:   Pleasant hispanic male appears to be in NAD, Well developed, Well nourished, alert and cooperative Head:  Normocephalic and atraumatic. Eyes:   PEERL, EOMI. No icterus. Conjunctiva pink. Ears:  Normal auditory acuity. Neck:  Supple Throat: Oral cavity and pharynx without inflammation, swelling or lesion.  Respiratory: Respirations even and unlabored. Lungs clear to auscultation bilaterally.   No wheezes, crackles, or rhonchi.  Cardiovascular: Normal S1, S2. No MRG. Regular rate and rhythm. No peripheral edema, cyanosis or pallor.  Gastrointestinal:  Soft, nondistended, nontender. No rebound or guarding. Normal bowel sounds. No appreciable masses or hepatomegaly. Rectal:  Not performed.  Msk:  +left hemiparesis, ambulates in a wheelchair Neurologic:  Alert and  oriented x4;  grossly normal neurologically.  Skin:   Dry and intact without significant lesions or rashes. Psychiatric: Demonstrates good judgement and reason without abnormal affect or behaviors.  MOST RECENT LABS AND IMAGING: CBC    Component Value Date/Time   WBC 10.5 06/29/2017 0747   RBC 3.09 (L) 06/29/2017 0747   HGB 9.1 (L) 06/29/2017 0747   HCT 29.4 (L) 06/29/2017 0747   PLT 210 06/29/2017 0747   MCV 95.1 06/29/2017 0747   MCH 29.4  06/29/2017  0747   MCHC 31.0 06/29/2017 0747   RDW 12.9 06/29/2017 0747   LYMPHSABS 0.9 06/25/2017 1321   MONOABS 0.5 06/25/2017 1321   EOSABS 0.1 06/25/2017 1321   BASOSABS 0.0 06/25/2017 1321    CMP     Component Value Date/Time   NA 135 07/04/2017 1559   K 4.0 07/04/2017 1559   CL 100 (L) 07/04/2017 1559   CO2 25 07/04/2017 1559   GLUCOSE 200 (H) 07/04/2017 1559   BUN 52 (H) 07/04/2017 1559   CREATININE 6.16 (H) 07/04/2017 1559   CREATININE 1.61 (H) 01/03/2016 1236   CALCIUM 8.8 (L) 07/04/2017 1559   CALCIUM 8.3 (L) 05/31/2017 0954   PROT 6.8 05/28/2017 1253   PROT 6.0 02/14/2016 1020   ALBUMIN 2.6 (L) 07/04/2017 1559   ALBUMIN 3.3 (L) 02/14/2016 1020   AST 15 05/28/2017 1253   ALT 12 (L) 05/28/2017 1253   ALKPHOS 75 05/28/2017 1253   BILITOT 0.3 05/28/2017 1253   BILITOT 0.2 02/14/2016 1020   GFRNONAA 9 (L) 07/04/2017 1559   GFRAA 11 (L) 07/04/2017 1559    Assessment: 1.  Screening for colorectal cancer: Patient is 36 never had a screening colonoscopy 2.  Chronic anticoagulation: On Plavix status post stent placement in 2017 3.  Hemodialysis 4.  Stroke: Last documented in May of this year, the family reports "smaller strokes" after that  Plan: 1.  Discussed with the patient and family alternatives to a standard colonoscopy including Cologuard or virtual colonoscopy.  After discussing risks and benefits, family is concerned that one of these tests may turn up positive and he will need a colonoscopy regardless.  They prefer to have colonoscopy. 2.  Long discussion had with the family in regards to prep, they did not feel that he could successfully prep at home given his debility and would like him admitted to the hospital prior to procedure. 3.  Discussed that we could possibly have the patient admitted and have dialysis while in the hospital the day before/ day after his colonoscopy.  This will take some arranging. 4.  Went ahead and canceled scheduledcolonoscopy with Dr.  Havery Moros in the [facility] on the 19th.  Explained that we will need to work on scheduling his screening colonoscopy at [facility] where there is dialysis.  We will contact them in regards to timing.  Ellouise Newer, PA-C [facility] 01/14/2018, 11:01 AM  Cc: [name], MD

## 2018-01-14 NOTE — Progress Notes (Signed)
This is a high risk patient, apparently declined other screening options and strongly wishes to have a colonoscopy. He will need to have clearance from Neurology to hold his Plavix for a colonoscopy, he could take aspirin during that time. His case is unique in that he needs help with bowel prep and admission, as well as HD, which is only available at [facility]. I can accomodate him when I am working in the hospital in February, however do not have any availability for my schedule at [facility] sooner. We should touch base with Dr. Rush Landmark to see if he has any openings at his [facility] block to assist with this if the patient wishes to have it done sooner. Thanks

## 2018-01-14 NOTE — Telephone Encounter (Signed)
January 14, 2018   Dear Dr. Antony Contras, [facility]  I had the pleasure of seeing Jeffery Young in the office today, 01-14-2018.  He came to talk to me about being scheduled for a screening colonoscopy.This patient is on dialysis , and has End Stage Renal Disease and takes Plavix for anticoagulation.     We are asking if he can be cleared for a colonoscopy, from your neurological  prospective. Details on the patient: Jeffery Young, DOB 1965-11-25.  If you have any questions or concerns, please don't hesitate to call.Please fax your response to [facility].   Sincerely,   Ellouise Newer PA-C

## 2018-01-14 NOTE — Telephone Encounter (Signed)
Called and spoke with wife (per DPR) and was able tro reschedule pt from 03/13/18 to 03/11/18 with Dr. Carlota Raspberry at 10:20am. I advised of time, building number and late policy. Pt's wife  acknowledged

## 2018-01-14 NOTE — Telephone Encounter (Signed)
January 14, 2018   Dear Dr. Lauree Chandler,  I had the pleasure of seeing [name] in the office today, 01-14-2018. He came to talk to me about being scheduled for a screening colonoscopy. This patient is on dialysis, and has End Stage Renal Disease.   The patient is on Plavix. We are asking if he can be cleared for a colonoscopy.  Please advise.  Details on the patient: [name], DOB: [birth year].   If you have any questions or concerns, please don't hesitate to call. Please route your response to [facility] CMA.   Sincerely,    Ellouise Newer , PA-C

## 2018-01-15 NOTE — Telephone Encounter (Signed)
He can hold the Plavix 5 days prior to his procedure. He can proceed with his procedure.   Jeffery Young 01/15/2018 2:28 PM

## 2018-01-16 NOTE — Telephone Encounter (Signed)
Since it has been greater than 6 months since his TIA/stroke his risk for recurrent stroke is acceptable and  he may stop Plavix for 5 days prior to scheduled colonoscopy with a small but acceptable. Procedural risk of TIA or stroke if patient is agreeable. He is to restart Plavix after the procedure when safe

## 2018-01-17 NOTE — Telephone Encounter (Signed)
Clearance for Plavix from Neuro and Cardiology. Can you please call the patient. We need to know if he could be on the late schedule for dialyzing later the same day of his colonoscopy. He should get done early morning, so I am hoping he would still be able to get home and go to dialysis at his regular center, that way we could admit him on a Tuesday for prep at the hospital and he could stay overnight and then have colo Wed morning and get out and head home to be back for dialysis. Please ask him if this would be reasonable, what time does he normally leave for dialysis?  Thanks-JLL

## 2018-01-17 NOTE — Telephone Encounter (Signed)
Left message on machine to call back  

## 2018-01-20 NOTE — Telephone Encounter (Signed)
Left message on machine to call back  

## 2018-01-21 NOTE — Telephone Encounter (Signed)
Dr Havery Moros the pt would like to have colon done in February with you.  He will need to be admitted on Tuesday prior to colon on Wed. Due to dialysis.  He will need to be released by 12 noon to get to dialysis by 12:30 pm.  It is too early to schedule at this time.  Just want to make you aware.  I will add recall to Epic.

## 2018-01-21 NOTE — Telephone Encounter (Signed)
Thanks Patty, I appreciate it. While he was approved to hold the Plavix during this time frame, I think he should take a baby aspirin when he holds the plavix to provide some coverage in light of his CVA. thanks

## 2018-01-23 ENCOUNTER — Encounter: Payer: BC Managed Care – PPO | Admitting: Gastroenterology

## 2018-01-26 ENCOUNTER — Other Ambulatory Visit: Payer: Self-pay

## 2018-01-26 ENCOUNTER — Encounter (HOSPITAL_COMMUNITY): Payer: Self-pay | Admitting: Emergency Medicine

## 2018-01-26 ENCOUNTER — Emergency Department (HOSPITAL_COMMUNITY)
Admission: EM | Admit: 2018-01-26 | Discharge: 2018-01-26 | Disposition: A | Discharge status: Home / Self Care | Payer: BC Managed Care – PPO | Source: Home / Self Care | Attending: Emergency Medicine | Admitting: Emergency Medicine

## 2018-01-26 ENCOUNTER — Emergency Department (HOSPITAL_COMMUNITY)
Admission: EM | Admit: 2018-01-26 | Discharge: 2018-01-26 | Disposition: A | Discharge status: Home / Self Care | Payer: BC Managed Care – PPO | Attending: Emergency Medicine | Admitting: Emergency Medicine

## 2018-01-26 ENCOUNTER — Emergency Department (HOSPITAL_COMMUNITY): Payer: BC Managed Care – PPO

## 2018-01-26 DIAGNOSIS — Z79899 Other long term (current) drug therapy: Secondary | ICD-10-CM | POA: Insufficient documentation

## 2018-01-26 DIAGNOSIS — I252 Old myocardial infarction: Secondary | ICD-10-CM | POA: Insufficient documentation

## 2018-01-26 DIAGNOSIS — N186 End stage renal disease: Secondary | ICD-10-CM | POA: Diagnosis not present

## 2018-01-26 DIAGNOSIS — Z7901 Long term (current) use of anticoagulants: Secondary | ICD-10-CM | POA: Insufficient documentation

## 2018-01-26 DIAGNOSIS — B349 Viral infection, unspecified: Secondary | ICD-10-CM | POA: Diagnosis not present

## 2018-01-26 DIAGNOSIS — Z992 Dependence on renal dialysis: Secondary | ICD-10-CM

## 2018-01-26 DIAGNOSIS — R112 Nausea with vomiting, unspecified: Secondary | ICD-10-CM | POA: Diagnosis present

## 2018-01-26 DIAGNOSIS — I132 Hypertensive heart and chronic kidney disease with heart failure and with stage 5 chronic kidney disease, or end stage renal disease: Secondary | ICD-10-CM | POA: Diagnosis not present

## 2018-01-26 DIAGNOSIS — Z794 Long term (current) use of insulin: Secondary | ICD-10-CM | POA: Insufficient documentation

## 2018-01-26 DIAGNOSIS — I2510 Atherosclerotic heart disease of native coronary artery without angina pectoris: Secondary | ICD-10-CM | POA: Insufficient documentation

## 2018-01-26 DIAGNOSIS — I255 Ischemic cardiomyopathy: Secondary | ICD-10-CM | POA: Diagnosis not present

## 2018-01-26 DIAGNOSIS — I120 Hypertensive chronic kidney disease with stage 5 chronic kidney disease or end stage renal disease: Secondary | ICD-10-CM

## 2018-01-26 DIAGNOSIS — R509 Fever, unspecified: Secondary | ICD-10-CM

## 2018-01-26 DIAGNOSIS — J449 Chronic obstructive pulmonary disease, unspecified: Secondary | ICD-10-CM | POA: Insufficient documentation

## 2018-01-26 DIAGNOSIS — E7800 Pure hypercholesterolemia, unspecified: Secondary | ICD-10-CM | POA: Diagnosis not present

## 2018-01-26 DIAGNOSIS — Z7902 Long term (current) use of antithrombotics/antiplatelets: Secondary | ICD-10-CM | POA: Diagnosis not present

## 2018-01-26 DIAGNOSIS — Z87891 Personal history of nicotine dependence: Secondary | ICD-10-CM | POA: Insufficient documentation

## 2018-01-26 DIAGNOSIS — E1122 Type 2 diabetes mellitus with diabetic chronic kidney disease: Secondary | ICD-10-CM

## 2018-01-26 DIAGNOSIS — I5042 Chronic combined systolic (congestive) and diastolic (congestive) heart failure: Secondary | ICD-10-CM | POA: Insufficient documentation

## 2018-01-26 DIAGNOSIS — Z7982 Long term (current) use of aspirin: Secondary | ICD-10-CM | POA: Insufficient documentation

## 2018-01-26 LAB — COMPREHENSIVE METABOLIC PANEL
ALT: 23 U/L (ref 0–44)
ALT: 19 U/L (ref 0–44)
AST: 33 U/L (ref 15–41)
AST: 23 U/L (ref 15–41)
Albumin: 3.6 g/dL (ref 3.5–5.0)
Albumin: 3.5 g/dL (ref 3.5–5.0)
Alkaline Phosphatase: 78 U/L (ref 38–126)
Alkaline Phosphatase: 63 U/L (ref 38–126)
Anion gap: 18 — ABNORMAL HIGH (ref 5–15)
Anion gap: 14 (ref 5–15)
BUN: 72 mg/dL — ABNORMAL HIGH (ref 6–20)
BUN: 35 mg/dL — ABNORMAL HIGH (ref 6–20)
CO2: 26 mmol/L (ref 22–32)
CO2: 32 mmol/L (ref 22–32)
Calcium: 8.8 mg/dL — ABNORMAL LOW (ref 8.9–10.3)
Calcium: 8.3 mg/dL — ABNORMAL LOW (ref 8.9–10.3)
Chloride: 92 mmol/L — ABNORMAL LOW (ref 98–111)
Chloride: 91 mmol/L — ABNORMAL LOW (ref 98–111)
Creatinine, Ser: 6.64 mg/dL — ABNORMAL HIGH (ref 0.61–1.24)
Creatinine, Ser: 4.54 mg/dL — ABNORMAL HIGH (ref 0.61–1.24)
GFR calc Af Amer: 10 mL/min — ABNORMAL LOW (ref >60)
GFR calc Af Amer: 16 mL/min — ABNORMAL LOW (ref >60)
GFR calc non Af Amer: 14 mL/min — ABNORMAL LOW (ref >60)
GFR, EST NON AFRICAN AMERICAN: 9 mL/min — AB (ref >60)
Glucose, Bld: 169 mg/dL — ABNORMAL HIGH (ref 70–99)
Glucose, Bld: 163 mg/dL — ABNORMAL HIGH (ref 70–99)
POTASSIUM: 3.8 mmol/L (ref 3.5–5.1)
POTASSIUM: 5.1 mmol/L (ref 3.5–5.1)
SODIUM: 137 mmol/L (ref 135–145)
Sodium: 136 mmol/L (ref 135–145)
Total Bilirubin: 0.9 mg/dL (ref 0.3–1.2)
Total Bilirubin: 0.5 mg/dL (ref 0.3–1.2)
Total Protein: 7.4 g/dL (ref 6.5–8.1)
Total Protein: 7.7 g/dL (ref 6.5–8.1)

## 2018-01-26 LAB — CBC WITH DIFFERENTIAL/PLATELET
Abs Immature Granulocytes: 0.03 10*3/uL (ref 0.00–0.07)
Abs Immature Granulocytes: 0.07 10*3/uL (ref 0.00–0.07)
Basophils Absolute: 0 10*3/uL (ref 0.0–0.1)
Basophils Absolute: 0 10*3/uL (ref 0.0–0.1)
Basophils Relative: 0 %
Basophils Relative: 0 %
EOS ABS: 0 10*3/uL (ref 0.0–0.5)
Eosinophils Absolute: 0 10*3/uL (ref 0.0–0.5)
Eosinophils Relative: 0 %
Eosinophils Relative: 0 %
HCT: 31.7 % — ABNORMAL LOW (ref 39.0–52.0)
HCT: 33.4 % — ABNORMAL LOW (ref 39.0–52.0)
Hemoglobin: 10.5 g/dL — ABNORMAL LOW (ref 13.0–17.0)
Hemoglobin: 10.9 g/dL — ABNORMAL LOW (ref 13.0–17.0)
Immature Granulocytes: 0 %
Immature Granulocytes: 1 %
LYMPHS ABS: 0.5 10*3/uL — AB (ref 0.7–4.0)
Lymphocytes Relative: 5 %
Lymphocytes Relative: 6 %
Lymphs Abs: 0.6 10*3/uL — ABNORMAL LOW (ref 0.7–4.0)
MCH: 31.8 pg (ref 26.0–34.0)
MCH: 32.2 pg (ref 26.0–34.0)
MCHC: 32.6 g/dL (ref 30.0–36.0)
MCHC: 33.1 g/dL (ref 30.0–36.0)
MCV: 97.2 fL (ref 80.0–100.0)
MCV: 97.4 fL (ref 80.0–100.0)
Monocytes Absolute: 0.7 10*3/uL (ref 0.1–1.0)
Monocytes Absolute: 0.7 10*3/uL (ref 0.1–1.0)
Monocytes Relative: 7 %
Monocytes Relative: 7 %
Neutro Abs: 8.9 10*3/uL — ABNORMAL HIGH (ref 1.7–7.7)
Neutro Abs: 9 10*3/uL — ABNORMAL HIGH (ref 1.7–7.7)
Neutrophils Relative %: 87 %
Neutrophils Relative %: 87 %
Platelets: 109 10*3/uL — ABNORMAL LOW (ref 150–400)
Platelets: 127 10*3/uL — ABNORMAL LOW (ref 150–400)
RBC: 3.26 MIL/uL — ABNORMAL LOW (ref 4.22–5.81)
RBC: 3.43 MIL/uL — ABNORMAL LOW (ref 4.22–5.81)
RDW: 13.1 % (ref 11.5–15.5)
RDW: 13.1 % (ref 11.5–15.5)
WBC: 10.2 10*3/uL (ref 4.0–10.5)
WBC: 10.2 10*3/uL (ref 4.0–10.5)
nRBC: 0 % (ref 0.0–0.2)
nRBC: 0 % (ref 0.0–0.2)

## 2018-01-26 LAB — I-STAT CG4 LACTIC ACID, ED: Lactic Acid, Venous: 1.37 mmol/L (ref 0.5–1.9)

## 2018-01-26 LAB — INFLUENZA PANEL BY PCR (TYPE A & B)
INFLBPCR: NEGATIVE
Influenza A By PCR: NEGATIVE

## 2018-01-26 LAB — LIPASE, BLOOD: LIPASE: 62 U/L — AB (ref 11–51)

## 2018-01-26 MED ORDER — VANCOMYCIN HCL IN DEXTROSE 1-5 GM/200ML-% IV SOLN: 1000.0000 mg | Freq: Once | INTRAVENOUS | Status: DC

## 2018-01-26 MED ORDER — SODIUM CHLORIDE 0.9 % IV SOLN
INTRAVENOUS | Status: DC | PRN
  Administered 2018-01-26: 500 mL via INTRAVENOUS

## 2018-01-26 MED ORDER — ACETAMINOPHEN 325 MG PO TABS
650.0000 mg | ORAL_TABLET | Freq: Once | ORAL | Status: AC
  Administered 2018-01-26: 650 mg via ORAL
  Filled 2018-01-26: qty 2

## 2018-01-26 MED ORDER — SODIUM CHLORIDE 0.9 % IV SOLN: 1.0000 g | INTRAVENOUS | Status: DC

## 2018-01-26 MED ORDER — ONDANSETRON 4 MG PO TBDP: 4.0000 mg | ORAL_TABLET | Freq: Three times a day (TID) | ORAL | 0 refills | Status: DC | PRN

## 2018-01-26 MED ORDER — VANCOMYCIN HCL 10 G IV SOLR
1250.0000 mg | INTRAVENOUS | Status: DC
  Administered 2018-01-26: 1250 mg via INTRAVENOUS
  Filled 2018-01-26: qty 1250

## 2018-01-26 MED ORDER — VANCOMYCIN VARIABLE DOSE PER UNSTABLE RENAL FUNCTION (PHARMACIST DOSING): Status: DC

## 2018-01-26 MED ORDER — ACETAMINOPHEN 500 MG PO TABS
1000.0000 mg | ORAL_TABLET | Freq: Once | ORAL | Status: AC
  Administered 2018-01-26: 1000 mg via ORAL
  Filled 2018-01-26: qty 2

## 2018-01-26 MED ORDER — SODIUM CHLORIDE 0.9 % IV SOLN
2.0000 g | Freq: Once | INTRAVENOUS | Status: DC
  Filled 2018-01-26: qty 2

## 2018-01-26 NOTE — Progress Notes (Signed)
Pharmacy Antibiotic Note  Jeffery Young is a 52 y.o. male admitted on 01/26/2018 with sepsis.  Pharmacy has been consulted for Vancomycin and Cefepime dosing.  Pt with ESRD - HD patient - was at HD today and picked up due to chills/cough. Pt states he doesn't think he received abx at HD . WBC wnl. Tm 101.7.  Plan: Cefepime 2gm IV now then 1gm IV q24h Vancomycin 1250mg  IV now then 500mg  IV qHD - will schedule maintenance doses once inpt HD schedule confirmed Will f/u micro data, renal function, and pt's clinical condition Vanc pre-HD level prn   Height: 5\' 5"  (165.1 cm) Weight: 130 lb (59 kg) IBW/kg (Calculated) : 61.5  Temp (24hrs), Avg:101.7 F (38.7 C), Min:101.7 F (38.7 C), Max:101.7 F (38.7 C)  Recent Labs  Lab 01/26/18 0015  WBC 10.2  CREATININE 6.64*    Estimated Creatinine Clearance: 11 mL/min (A) (by C-G formula based on SCr of 6.64 mg/dL (H)).    No Known Allergies  Antimicrobials this admission: 12/22 Vanc >>  12/22 Cefepime >>    Microbiology results: 12/22 BCx:   Thank you for allowing pharmacy to be a part of this patient's care.  Sherlon Handing, PharmD, BCPS Clinical pharmacist  **Pharmacist phone directory can now be found on [email or web address] ([email or web address]).  Listed under [place]. 01/26/2018 5:10 PM

## 2018-01-26 NOTE — ED Notes (Signed)
PT states understanding of care given, follow up care, and medication prescribed. PT ambulated from ED to car with a steady gait. 

## 2018-01-26 NOTE — ED Notes (Signed)
Pt tolerating PO fluids

## 2018-01-26 NOTE — ED Notes (Signed)
Pt was prepped for DC and requested to use bathroom. Pt was assisted to wheelchair and assisted into bathroom by staff members, on toilet in seated position. Pull cord for assistance was placed in pt's had and repeated instruction not to get up without RN assistance was provided. Pt verbalized understanding and repeated RN instruction back to this RN. While waiting outside bathroom door, this RN heard loud bang. Opened door to find pt kneeling on floor in front of sink. Pt stated "sorry- I tried to pull my pants up". Pt was assisted by this RN and D [place] EMT to wheelchair and returned to room Pt denies injury, no s/sx of trauma on assessment. MD Vanita Panda made aware of fall and to bedside to assess. Pt denies injury, no appreciable trauma on exam. Per MD Vanita Panda. OK to proceed with DC. Return precautions discussed w/ pt.

## 2018-01-26 NOTE — Discharge Instructions (Addendum)
You were seen today for vomiting and fever.  This is likely related to a viral illness.  This could be early flu.  Follow-up closely with your primary doctor.  Make sure that you stay well-hydrated.

## 2018-01-26 NOTE — Discharge Instructions (Signed)
As discussed, your evaluation today has been largely reassuring.  But, it is important that you monitor your condition carefully, and do not hesitate to return to the ED if you develop new, or concerning changes in your condition. ? ?Otherwise, please follow-up with your physician for appropriate ongoing care. ? ?

## 2018-01-26 NOTE — ED Provider Notes (Signed)
[facility] EMERGENCY DEPARTMENT Provider Note   CSN: [id number] Arrival date & time: 01/26/18  [place]     History   Chief Complaint Chief Complaint  Patient presents with  . Chills  . Cough    HPI Jeffery Young is a 52 y.o. male.  HPI Patient presents from his dialysis center with concern of fever, nausea. Patient last had dialysis yesterday. He notes that he completed a session. However, during the day he developed nausea, had several episodes of vomiting. He was actually seen here about 15 hours ago, had labs, and was discharged in stable condition peer He notes no subsequent episodes of vomiting. However, after going to dialysis today he was sent here due to reportedly looking ill. EMS notes the patient was also febrile, possibly to 105. Patient acknowledges mild ongoing nausea, denies chest pain, denies abdominal pain, denies diarrhea. He did seemingly have his influenza vaccine this year.  Past Medical History:  Diagnosis Date  . Chest pain 01/10/2016  . Chronic combined systolic and diastolic heart failure ([place])   . Coronary artery disease    a.12/22/15: NSTEMI s/p overlapping DES x2 and balloon angioplasty to distal AV groove Circumflex (too small for a stent).   . Coronary artery disease due to lipid rich plaque 01/10/2016  . Dehydration 08/22/2012  . Depression   . Elevated troponin   . ESRD (end stage renal disease) on dialysis ([place])   . Essential hypertension   . History of completed stroke 05/28/2017  . Hypercholesteremia   . Hypertension   . Hypertensive heart disease with heart failure ([place])   . Ischemic cardiomyopathy 03/13/2016  . Normocytic anemia 05/28/2017  . NSTEMI (non-ST elevated myocardial infarction) ([place]) 12/22/2015  . Prostate infection   . Status post coronary artery stent placement   . Syncope 03/13/2016  . Tobacco abuse   . Type 2 diabetes mellitus with diabetic nephropathy, without long-term current use of insulin ([place])   .  Type II diabetes mellitus ([place])   . Uncontrolled hypertension 05/28/2017  . Unstable angina pectoris ([place]) 01/11/2016    Patient Active Problem List   Diagnosis Date Noted  . COPD GOLD III  10/24/2017  . CAD (coronary artery disease), native coronary artery 10/10/2017  . ESRD (end stage renal disease) on dialysis ([place]) 06/25/2017  . Uncontrolled hypertension 05/28/2017  . Depression 05/28/2017  . Normocytic anemia 05/28/2017  . History of completed stroke 05/28/2017  . Syncope 03/13/2016  . Ischemic cardiomyopathy 03/13/2016  . Unstable angina pectoris ([place]) 01/11/2016  . Hypertensive heart disease with heart failure ([place])   . Chronic combined systolic and diastolic heart failure ([place])   . Elevated troponin   . Chest pain 01/10/2016  . Coronary artery disease due to lipid rich plaque 01/10/2016  . Status post coronary artery stent placement   . Type 2 diabetes mellitus with diabetic nephropathy, without long-term current use of insulin ([place])   . Tobacco abuse   . Essential hypertension   . Hypercholesteremia   . NSTEMI (non-ST elevated myocardial infarction) ([place]) 12/22/2015  . Lightheadedness 08/22/2012  . Dehydration 08/22/2012    Past Surgical History:  Procedure Laterality Date  . BASCILIC VEIN TRANSPOSITION Left 06/28/2017   Procedure: LEFT UPPER ARM ARTERIOVENOUS GORTEX-GRAFT PLACEMENT;  Surgeon: Angelia Mould, MD;  Location: [facility];  Service: Vascular;  Laterality: Left;  . CARDIAC CATHETERIZATION N/A 12/22/2015   Procedure: Left Heart Cath and Coronary Angiography;  Surgeon: Burnell Blanks, MD;  Location: [facility];  Service: Cardiovascular;  Laterality: N/A;  . CARDIAC CATHETERIZATION N/A 12/22/2015   Procedure: Coronary Stent Intervention;  Surgeon: Burnell Blanks, MD;  Location: [facility];  Service: Cardiovascular;  Laterality: N/A;  . CARDIAC CATHETERIZATION N/A 01/11/2016   Procedure: Left Heart Cath and Coronary Angiography;   Surgeon: Sherren Mocha, MD;  Location: [facility];  Service: Cardiovascular;  Laterality: N/A;  . CARDIAC CATHETERIZATION N/A 01/11/2016   Procedure: Intravascular Pressure Wire/FFR Study;  Surgeon: Sherren Mocha, MD;  Location: [facility];  Service: Cardiovascular;  Laterality: N/A;  . CARDIAC CATHETERIZATION N/A 01/11/2016   Procedure: Coronary Stent Intervention;  Surgeon: Sherren Mocha, MD;  Location: [facility];  Service: Cardiovascular;  Laterality: N/A;  . CORONARY ANGIOPLASTY WITH STENT PLACEMENT  12/22/2015  . INSERTION OF DIALYSIS CATHETER Right 06/28/2017   Procedure: INSERTION OF DIALYSIS CATHETER RIGHT INTERNAL JUGULAR;  Surgeon: Angelia Mould, MD;  Location: [facility] OR;  Service: Vascular;  Laterality: Right;        Home Medications    Prior to Admission medications   Medication Sig Start Date End Date Taking? Authorizing Provider  aspirin 81 MG chewable tablet Chew 1 tablet (81 mg total) by mouth daily. 08/15/17   [name], MD  atorvastatin (LIPITOR) 10 MG tablet Take 1 tablet (10 mg total) by mouth daily. 10/10/17   Lyda Jester M, PA-C  calcitRIOL (ROCALTROL) 0.5 MCG capsule TAKE 1 CAPSULE BY MOUTH ON MONDAY, WEDNESDAY, AND FRIDAY WITH HEMODIALYSIS 01/04/18   [name], MD  carvedilol (COREG) 6.25 MG tablet Take 1 tablet (6.25 mg total) by mouth 2 (two) times daily with a meal. 10/10/17   Lyda Jester M, PA-C  clopidogrel (PLAVIX) 75 MG tablet Take 1 tablet (75 mg total) by mouth daily. 10/10/17   Lyda Jester M, PA-C  insulin aspart (NOVOLOG) 100 UNIT/ML FlexPen Inject 0-10 Units into the skin 3 (three) times daily before meals. Sliding Scale: 0-150: 0u; 151-200: 2u; 201-250: 4u; 251-300: 6u; 301-350: 8u; 351-400:10u. 401+ Give 10u and call MD 10/03/17   [name], MD  Insulin Pen Needle (PEN NEEDLES) 32G X 4 MM MISC 1 Dose by Does not apply route 3 (three) times daily. Use with sliding scale novolog with meals.  10/03/17   [name], MD  multivitamin (RENA-VIT) TABS tablet Take 1 tablet by mouth at bedtime. 08/15/17   [name], MD  Na Sulfate-K Sulfate-Mg Sulf 17.5-3.13-1.6 GM/177ML SOLN Take as directed for colonoscopy prep. 01/14/18   Levin Erp, PA  nitroGLYCERIN (NITROSTAT) 0.4 MG SL tablet Place 1 tablet (0.4 mg total) under the tongue every 5 (five) minutes x 3 doses as needed for chest pain. 12/26/15   Cheryln Manly, NP  ondansetron (ZOFRAN ODT) 4 MG disintegrating tablet Take 1 tablet (4 mg total) by mouth every 8 (eight) hours as needed for nausea or vomiting. 01/26/18   Horton, Barbette Hair, MD  ONE TOUCH LANCETS MISC One Touch [facility] 49F.  Please provide Verio test strips.  To test four times daily. 08/30/17   [name], MD  sertraline (ZOLOFT) 50 MG tablet Take 1 tablet (50 mg total) by mouth daily. 08/15/17   [name], MD  sevelamer carbonate (RENVELA) 800 MG tablet Take 1 tablet (800 mg total) by mouth 3 (three) times daily with meals. 08/15/17   [name], MD  STOOL SOFTENER 100 MG capsule TAKE ONE CAPSULE BY MOUTH TWO TIMES  A DAY 12/05/17   [name], MD  traZODone (DESYREL) 50 MG tablet Take 1 tablet (50 mg total) by mouth at bedtime. 08/15/17   [name], MD    Family History Family History  Problem Relation Age of Onset  . CAD Brother   . CVA Brother   . Chronic Renal Failure Brother 50       on HD  . CAD Brother     Social History Social History   Tobacco Use  . Smoking status: Former Smoker    Packs/day: 0.75    Years: 29.00    Pack years: 21.75    Types: Cigarettes    Last attempt to quit: 05/28/2017    Years since quitting: 0.6  . Smokeless tobacco: Never Used  Substance Use Topics  . Alcohol use: Not Currently    Alcohol/week: 7.0 standard drinks    Types: 7 Cans of beer per week    Frequency: Never    Comment: last drink was prior to last hospitalization  . Drug use: No     Allergies    Patient has no known allergies.   Review of Systems Review of Systems  Constitutional:       Per HPI, otherwise negative  HENT:       Per HPI, otherwise negative  Respiratory:       Per HPI, otherwise negative  Cardiovascular:       Per HPI, otherwise negative  Gastrointestinal: Positive for nausea and vomiting.  Endocrine:       Negative aside from HPI  Genitourinary:       Neg aside from HPI   Musculoskeletal:       Per HPI, otherwise negative  Skin: Negative.   Allergic/Immunologic: Positive for immunocompromised state.  Neurological: Negative for syncope.     Physical Exam Updated Vital Signs BP (!) 105/56   Pulse 83   Resp (!) 27   Ht 5\' 5"  (1.651 m)   Wt 59 kg   SpO2 99%   BMI 21.63 kg/m   Physical Exam Vitals signs and nursing note reviewed.  Constitutional:      General: He is not in acute distress.    Appearance: He is well-developed.  HENT:     Head: Normocephalic and atraumatic.  Eyes:     Conjunctiva/sclera: Conjunctivae normal.  Cardiovascular:     Rate and Rhythm: Normal rate and regular rhythm.  Pulmonary:     Effort: Pulmonary effort is normal. No respiratory distress.     Breath sounds: No stridor.  Abdominal:     General: There is no distension.  Musculoskeletal:     Comments: Left upper arm dialysis unremarkable  Skin:    General: Skin is warm and dry.  Neurological:     Mental Status: He is alert and oriented to person, place, and time.      ED Treatments / Results  Labs (all labs ordered are listed, but only abnormal results are displayed) Labs Reviewed  COMPREHENSIVE METABOLIC PANEL - Abnormal; Notable for the following components:      Result Value   Chloride 91 (*)    Glucose, Bld 163 (*)    BUN 35 (*)    Creatinine, Ser 4.54 (*)    Calcium 8.3 (*)    GFR calc non Af Amer 14 (*)    GFR calc Af Amer 16 (*)    All other components within normal limits  CBC WITH DIFFERENTIAL/PLATELET - Abnormal; Notable for the  following components:   RBC 3.26 (*)    Hemoglobin 10.5 (*)    HCT 31.7 (*)    Platelets 109 (*)    Neutro Abs 9.0 (*)    Lymphs Abs 0.5 (*)    All other components within normal limits  CULTURE, BLOOD (ROUTINE X 2)  CULTURE, BLOOD (ROUTINE X 2)  INFLUENZA PANEL BY PCR (TYPE A & B)  URINALYSIS, ROUTINE W REFLEX MICROSCOPIC  I-STAT CG4 LACTIC ACID, ED  I-STAT CG4 LACTIC ACID, ED    EKG EKG Interpretation  Date/Time:  Sunday January 26 2018 17:11:17 EST Ventricular Rate:  101 PR Interval:    QRS Duration: 87 QT Interval:  371 QTC Calculation: 481 R Axis:   50 Text Interpretation:  Sinus tachycardia Anteroseptal infarct, old T wave abnormality Abnormal ekg Confirmed by Carmin Muskrat [phone]) on 01/26/2018 5:51:47 PM   Radiology Dg Chest 2 View  Result Date: 01/26/2018 CLINICAL DATA:  Fever, cough, and chills.  Nausea and vomiting. EXAM: CHEST - 2 VIEW COMPARISON:  01/26/2018 FINDINGS: The heart size and mediastinal contours are within normal limits. Both lungs are clear. The visualized skeletal structures are unremarkable. IMPRESSION: Negative.  No active cardiopulmonary disease. Electronically Signed   By: Earle Gell M.D.   On: 01/26/2018 18:46   Dg Chest 2 View  Result Date: 01/26/2018 CLINICAL DATA:  Acute onset of nausea and vomiting. Patient on dialysis. EXAM: CHEST - 2 VIEW COMPARISON:  Chest radiograph performed 10/03/2017 FINDINGS: The lungs are well-aerated and clear. There is no evidence of focal opacification, pleural effusion or pneumothorax. The heart is normal in size; the mediastinal contour is within normal limits. No acute osseous abnormalities are seen. IMPRESSION: No acute cardiopulmonary process seen. Electronically Signed   By: Garald Balding M.D.   On: 01/26/2018 00:51    Procedures Procedures (including critical care time)  Medications Ordered in ED Medications  0.9 %  sodium chloride infusion ( Intravenous Stopped 01/26/18 1824)  acetaminophen  (TYLENOL) tablet 1,000 mg (1,000 mg Oral Given 01/26/18 1732)     Initial Impression / Assessment and Plan / ED Course  I have reviewed the triage vital signs and the nursing notes.  Pertinent labs & imaging results that were available during my care of the patient were reviewed by me and considered in my medical decision making (see chart for details).     7:47 PM On repeat exam patient is awake, alert, speaking clearly. Patient has had resolution of his fever. He has been joined by his wife, family. We discussed this evaluation and his evaluation overnight. Labs, x-ray here reassuring, no leukocytosis, no evidence for pneumonia on x-ray, negative influenza testing, with a soft abdomen, no ongoing vomiting, nor any since yesterday, the patient is appropriate for discharge with close outpatient follow-up.   Final Clinical Impressions(s) / ED Diagnoses  Fever   Carmin Muskrat, MD 01/26/18 [phone]

## 2018-01-26 NOTE — ED Notes (Signed)
Pt transported to xray 

## 2018-01-26 NOTE — ED Provider Notes (Signed)
[facility] EMERGENCY DEPARTMENT Provider Note   CSN: [id number] Arrival date & time: 01/26/18  0001     History   Chief Complaint Chief Complaint  Patient presents with  . Emesis    HPI Jeffery Young is a 52 y.o. male.  HPI  52 year old male with a history of coronary artery disease, CHF, end-stage renal disease who presents with nausea and vomiting.  Patient reports onset of symptoms 2 hours prior to arrival.  Had a full dialysis session yesterday.  Denies any diarrhea or constipation.  Denies any blood or bile in his emesis.  Denies chest pain, shortness of breath, abdominal pain.  Patient had resolution of vomiting with Zofran in route.  No known sick contacts.  Denies any history of fevers but was noted to be febrile after triage.  Denies any cough.  He does make urine, no urinary symptoms.  Past Medical History:  Diagnosis Date  . Chest pain 01/10/2016  . Chronic combined systolic and diastolic heart failure ([place])   . Coronary artery disease    a.12/22/15: NSTEMI s/p overlapping DES x2 and balloon angioplasty to distal AV groove Circumflex (too small for a stent).   . Coronary artery disease due to lipid rich plaque 01/10/2016  . Dehydration 08/22/2012  . Depression   . Elevated troponin   . ESRD (end stage renal disease) on dialysis ([place])   . Essential hypertension   . History of completed stroke 05/28/2017  . Hypercholesteremia   . Hypertension   . Hypertensive heart disease with heart failure ([place])   . Ischemic cardiomyopathy 03/13/2016  . Normocytic anemia 05/28/2017  . NSTEMI (non-ST elevated myocardial infarction) ([place]) 12/22/2015  . Prostate infection   . Status post coronary artery stent placement   . Syncope 03/13/2016  . Tobacco abuse   . Type 2 diabetes mellitus with diabetic nephropathy, without long-term current use of insulin ([place])   . Type II diabetes mellitus ([place])   . Uncontrolled hypertension 05/28/2017  . Unstable angina pectoris ([place])  01/11/2016    Patient Active Problem List   Diagnosis Date Noted  . COPD GOLD III  10/24/2017  . CAD (coronary artery disease), native coronary artery 10/10/2017  . ESRD (end stage renal disease) on dialysis ([place]) 06/25/2017  . Uncontrolled hypertension 05/28/2017  . Depression 05/28/2017  . Normocytic anemia 05/28/2017  . History of completed stroke 05/28/2017  . Syncope 03/13/2016  . Ischemic cardiomyopathy 03/13/2016  . Unstable angina pectoris ([place]) 01/11/2016  . Hypertensive heart disease with heart failure ([place])   . Chronic combined systolic and diastolic heart failure ([place])   . Elevated troponin   . Chest pain 01/10/2016  . Coronary artery disease due to lipid rich plaque 01/10/2016  . Status post coronary artery stent placement   . Type 2 diabetes mellitus with diabetic nephropathy, without long-term current use of insulin ([place])   . Tobacco abuse   . Essential hypertension   . Hypercholesteremia   . NSTEMI (non-ST elevated myocardial infarction) ([place]) 12/22/2015  . Lightheadedness 08/22/2012  . Dehydration 08/22/2012    Past Surgical History:  Procedure Laterality Date  . BASCILIC VEIN TRANSPOSITION Left 06/28/2017   Procedure: LEFT UPPER ARM ARTERIOVENOUS GORTEX-GRAFT PLACEMENT;  Surgeon: Angelia Mould, MD;  Location: [facility];  Service: Vascular;  Laterality: Left;  . CARDIAC CATHETERIZATION N/A 12/22/2015   Procedure: Left Heart Cath and Coronary Angiography;  Surgeon: Burnell Blanks, MD;  Location: [facility];  Service: Cardiovascular;  Laterality: N/A;  .  CARDIAC CATHETERIZATION N/A 12/22/2015   Procedure: Coronary Stent Intervention;  Surgeon: Burnell Blanks, MD;  Location: [facility];  Service: Cardiovascular;  Laterality: N/A;  . CARDIAC CATHETERIZATION N/A 01/11/2016   Procedure: Left Heart Cath and Coronary Angiography;  Surgeon: Sherren Mocha, MD;  Location: [facility];  Service: Cardiovascular;  Laterality: N/A;    . CARDIAC CATHETERIZATION N/A 01/11/2016   Procedure: Intravascular Pressure Wire/FFR Study;  Surgeon: Sherren Mocha, MD;  Location: [facility];  Service: Cardiovascular;  Laterality: N/A;  . CARDIAC CATHETERIZATION N/A 01/11/2016   Procedure: Coronary Stent Intervention;  Surgeon: Sherren Mocha, MD;  Location: [facility];  Service: Cardiovascular;  Laterality: N/A;  . CORONARY ANGIOPLASTY WITH STENT PLACEMENT  12/22/2015  . INSERTION OF DIALYSIS CATHETER Right 06/28/2017   Procedure: INSERTION OF DIALYSIS CATHETER RIGHT INTERNAL JUGULAR;  Surgeon: Angelia Mould, MD;  [facility];  Service: Vascular;  Laterality: Right;        Home Medications    Prior to Admission medications   Medication Sig Start Date End Date Taking? Authorizing Provider  aspirin 81 MG chewable tablet Chew 1 tablet (81 mg total) by mouth daily. 08/15/17   [name], MD  atorvastatin (LIPITOR) 10 MG tablet Take 1 tablet (10 mg total) by mouth daily. 10/10/17   Lyda Jester M, PA-C  calcitRIOL (ROCALTROL) 0.5 MCG capsule TAKE 1 CAPSULE BY MOUTH ON MONDAY, WEDNESDAY, AND FRIDAY WITH HEMODIALYSIS 01/04/18   [name], MD  carvedilol (COREG) 6.25 MG tablet Take 1 tablet (6.25 mg total) by mouth 2 (two) times daily with a meal. 10/10/17   Lyda Jester M, PA-C  clopidogrel (PLAVIX) 75 MG tablet Take 1 tablet (75 mg total) by mouth daily. 10/10/17   Lyda Jester M, PA-C  insulin aspart (NOVOLOG) 100 UNIT/ML FlexPen Inject 0-10 Units into the skin 3 (three) times daily before meals. Sliding Scale: 0-150: 0u; 151-200: 2u; 201-250: 4u; 251-300: 6u; 301-350: 8u; 351-400:10u. 401+ Give 10u and call MD 10/03/17   [name], MD  Insulin Pen Needle (PEN NEEDLES) 32G X 4 MM MISC 1 Dose by Does not apply route 3 (three) times daily. Use with sliding scale novolog with meals. 10/03/17   [name], MD  multivitamin (RENA-VIT) TABS tablet Take 1 tablet by mouth at bedtime.  08/15/17   [name], MD  Na Sulfate-K Sulfate-Mg Sulf 17.5-3.13-1.6 GM/177ML SOLN Take as directed for colonoscopy prep. 01/14/18   Levin Erp, PA  nitroGLYCERIN (NITROSTAT) 0.4 MG SL tablet Place 1 tablet (0.4 mg total) under the tongue every 5 (five) minutes x 3 doses as needed for chest pain. 12/26/15   Cheryln Manly, NP  ondansetron (ZOFRAN ODT) 4 MG disintegrating tablet Take 1 tablet (4 mg total) by mouth every 8 (eight) hours as needed for nausea or vomiting. 01/26/18   , Barbette Hair, MD  ONE TOUCH LANCETS MISC One Touch [facility] 93J.  Please provide Verio test strips.  To test four times daily. 08/30/17   [name], MD  sertraline (ZOLOFT) 50 MG tablet Take 1 tablet (50 mg total) by mouth daily. 08/15/17   [name], MD  sevelamer carbonate (RENVELA) 800 MG tablet Take 1 tablet (800 mg total) by mouth 3 (three) times daily with meals. 08/15/17   [name], MD  STOOL SOFTENER 100 MG capsule TAKE ONE CAPSULE BY MOUTH TWO TIMES A DAY 12/05/17   [name],  MD  traZODone (DESYREL) 50 MG tablet Take 1 tablet (50 mg total) by mouth at bedtime. 08/15/17   [name], MD    Family History Family History  Problem Relation Age of Onset  . CAD Brother   . CVA Brother   . Chronic Renal Failure Brother 50       on HD  . CAD Brother     Social History Social History   Tobacco Use  . Smoking status: Former Smoker    Packs/day: 0.75    Years: 29.00    Pack years: 21.75    Types: Cigarettes    Last attempt to quit: 05/28/2017    Years since quitting: 0.6  . Smokeless tobacco: Never Used  Substance Use Topics  . Alcohol use: Not Currently    Alcohol/week: 7.0 standard drinks    Types: 7 Cans of beer per week    Frequency: Never    Comment: last drink was prior to last hospitalization  . Drug use: No     Allergies   Patient has no known allergies.   Review of Systems Review of Systems  Constitutional: Positive  for fever.  Respiratory: Negative for shortness of breath.   Cardiovascular: Negative for chest pain.  Gastrointestinal: Positive for nausea and vomiting. Negative for abdominal pain, constipation and diarrhea.  Genitourinary: Negative for dysuria.  All other systems reviewed and are negative.    Physical Exam Updated Vital Signs BP (!) 150/79   Pulse 98   Temp (!) 101.7 F (38.7 C) (Oral)   Resp 17   Ht 1.6 m (5\' 3" )   Wt 59 kg   SpO2 93%   BMI 23.04 kg/m   Physical Exam Vitals signs and nursing note reviewed.  Constitutional:      Appearance: He is well-developed.  HENT:     Head: Normocephalic and atraumatic.     Comments: Petechial rash noted around the bilateral eyes Eyes:     Pupils: Pupils are equal, round, and reactive to light.  Neck:     Musculoskeletal: Neck supple.  Cardiovascular:     Rate and Rhythm: Normal rate and regular rhythm.     Heart sounds: Normal heart sounds. No murmur.     Comments: Fistula left upper extremity Pulmonary:     Effort: Pulmonary effort is normal. No respiratory distress.     Breath sounds: Normal breath sounds. No wheezing.  Abdominal:     General: Bowel sounds are normal.     Palpations: Abdomen is soft.     Tenderness: There is no abdominal tenderness. There is no rebound.  Lymphadenopathy:     Cervical: No cervical adenopathy.  Skin:    General: Skin is warm and dry.  Neurological:     Mental Status: He is alert and oriented to person, place, and time.  Psychiatric:        Mood and Affect: Mood normal.      ED Treatments / Results  Labs (all labs ordered are listed, but only abnormal results are displayed) Labs Reviewed  CBC WITH DIFFERENTIAL/PLATELET - Abnormal; Notable for the following components:      Result Value   RBC 3.43 (*)    Hemoglobin 10.9 (*)    HCT 33.4 (*)    Platelets 127 (*)    Neutro Abs 8.9 (*)    Lymphs Abs 0.6 (*)    All other components within normal limits  COMPREHENSIVE METABOLIC  PANEL - Abnormal; Notable for the following components:  Chloride 92 (*)    Glucose, Bld 169 (*)    BUN 72 (*)    Creatinine, Ser 6.64 (*)    Calcium 8.8 (*)    GFR calc non Af Amer 9 (*)    GFR calc Af Amer 10 (*)    Anion gap 18 (*)    All other components within normal limits  LIPASE, BLOOD - Abnormal; Notable for the following components:   Lipase 62 (*)    All other components within normal limits  URINALYSIS, ROUTINE W REFLEX MICROSCOPIC    EKG EKG Interpretation  Date/Time:  "Sunday January 26 2018 00:18:05 EST Ventricular Rate:  107 PR Interval:    QRS Duration: 85 QT Interval:  347 QTC Calculation: 463 R Axis:   63 Text Interpretation:  Sinus tachycardia Probable anteroseptal infarct, old Tachycardia when compared to prior Confirmed by ,  ([id number]) on 01/26/2018 12:24:45 AM   Radiology Dg Chest 2 View  Result Date: 01/26/2018 CLINICAL DATA:  Acute onset of nausea and vomiting. Patient on dialysis. EXAM: CHEST - 2 VIEW COMPARISON:  Chest radiograph performed 10/03/2017 FINDINGS: The lungs are well-aerated and clear. There is no evidence of focal opacification, pleural effusion or pneumothorax. The heart is normal in size; the mediastinal contour is within normal limits. No acute osseous abnormalities are seen. IMPRESSION: No acute cardiopulmonary process seen. Electronically Signed   By: Jeffery  Chang M.D.   On: 01/26/2018 00:51    Procedures Procedures (including critical care time)  Medications Ordered in ED Medications  acetaminophen (TYLENOL) tablet 650 mg (650 mg Oral Given 01/26/18 0052)     Initial Impression / Assessment and Plan / ED Course  I have reviewed the triage vital signs and the nursing notes.  Pertinent labs & imaging results that were available during my care of the patient were reviewed by me and considered in my medical decision making (see chart for details).     Patient presents with vomiting that started 2 hours prior  to arrival.  Noted to be febrile to 101.7.  He otherwise is asymptomatic.  No chest pain, abdominal pain, shortness of breath.  His exam is overall reassuring.  He is mildly tachycardic.  Temperature to 101.7.  Abdominal exam is benign.  Lab work obtained.  Lab work is largely at patient's baseline.  Metabolic panel is within his range.  Chest x-ray shows no evidence of pneumonia.  He has not had any recurrent emesis while in the department.  Vital signs have normalized with a repeat heart rate of 96.  He is able to orally hydrate.  Suspect viral etiology.  Could be early influenza.  Discussed the risk and benefits of Tamiflu.  Patient declines at this time.  Recommend supportive measures at home including hydration and Zofran.  Follow-up with dialysis as scheduled.  After history, exam, and medical workup I feel the patient has been appropriately medically screened and is safe for discharge home. Pertinent diagnoses were discussed with the patient. Patient was given return precautions.   Final Clinical Impressions(s) / ED Diagnoses   Final diagnoses:  Viral syndrome    ED Discharge Orders         Ordered    ondansetron (ZOFRAN ODT) 4 MG disintegrating tablet  Every 8 hours PRN     12" /22/19 0207           Merryl Hacker, MD 01/26/18 [phone]

## 2018-01-26 NOTE — ED Notes (Signed)
Patient verbalizes understanding of discharge instructions. Opportunity for questioning and answers were provided. Armband removed by staff, pt discharged from ED via wheelchair w/ family   

## 2018-01-26 NOTE — ED Triage Notes (Signed)
Per [facility]- pt picked up from dialysis today due to chills and cough.  States he was seen here yesterday for n/v.

## 2018-01-26 NOTE — ED Triage Notes (Signed)
BIB [facility] from home with c/o of n/v X 2 hours. Pt received dialysis tx yesterday without complication. Next tx scheduled for tomorrow. Received 4mg  zofran in route with relief. 101.7 temp on arrival.

## 2018-01-30 ENCOUNTER — Ambulatory Visit: Payer: Self-pay

## 2018-01-30 NOTE — Telephone Encounter (Signed)
Patients wife called to state that her husband has a pink tinge to his depends.  He voids frequently even though he is a dialysis patient. She states that she has noticed an odor to his urine.  She states that her husband is denying burning with urination.  She states he always has back pain form arthritis.  He takes plavix.  He had vomiting with his virus he was treated for in the ED.No vomiting now.  No fever for 48 hours. He is a dialysis patient Monday, Wednesday and Fridays. She is also seeking a hospital F/U visit for him.  That appointment was scheduled next available with Dr Carlota Raspberry per pt availability. Per protocol pt will go to urgent care today.  No appointment available at office.  Care advice read to patient wife.  Wife verbalized understanding of all instructions.  Reason for Disposition . Taking Coumadin (warfarin) or other strong blood thinner, or known bleeding disorder (e.g., thrombocytopenia)  Answer Assessment - Initial Assessment Questions 1. COLOR of URINE: "Describe the color of the urine."  (e.g., tea-colored, pink, red, blood clots, bloody)     Depends pink looking wetness foul odor 2. ONSET: "When did the bleeding start?"      Unsure just noticed this week 3. EPISODES: "How many times has there been blood in the urine?" or "How many times today?"     unsure 4. PAIN with URINATION: "Is there any pain with passing your urine?" If so, ask: "How bad is the pain?"  (Scale 1-10; or mild, moderate, severe)    - MILD - complains slightly about urination hurting    - MODERATE - interferes with normal activities      - SEVERE - excruciating, unwilling or unable to urinate because of the pain      none 5. FEVER: "Do you have a fever?" If so, ask: "What is your temperature, how was it measured, and when did it start?"     No earlier this week fever free for 48 hours 6. ASSOCIATED SYMPTOMS: "Are you passing urine more frequently than usual?"     unsure 7. OTHER SYMPTOMS: "Do you have  any other symptoms?" (e.g., back/flank pain, abdominal pain, vomiting)     Pain to back is usual vomited with virus 8. PREGNANCY: "Is there any chance you are pregnant?" "When was your last menstrual period?"     N/A  Protocols used: URINE - BLOOD IN-A-AH

## 2018-01-31 LAB — CULTURE, BLOOD (ROUTINE X 2)
Culture: NO GROWTH
Special Requests: ADEQUATE

## 2018-02-01 ENCOUNTER — Other Ambulatory Visit: Payer: Self-pay

## 2018-02-01 ENCOUNTER — Ambulatory Visit
Admission: EM | Admit: 2018-02-01 | Discharge: 2018-02-01 | Disposition: A | Discharge status: Home / Self Care | Payer: BC Managed Care – PPO | Attending: Family Medicine | Admitting: Family Medicine

## 2018-02-01 ENCOUNTER — Encounter: Payer: Self-pay | Admitting: Emergency Medicine

## 2018-02-01 DIAGNOSIS — R319 Hematuria, unspecified: Secondary | ICD-10-CM | POA: Insufficient documentation

## 2018-02-01 MED ORDER — SULFAMETHOXAZOLE-TRIMETHOPRIM 800-160 MG PO TABS: 1.0000 | ORAL_TABLET | Freq: Two times a day (BID) | ORAL | 0 refills | Status: AC

## 2018-02-01 NOTE — Discharge Instructions (Addendum)
We are treating you for a urinary tract infection He will take the medicine twice a day for the next 10 days Please follow-up with your doctor next week for recheck or sooner for worsening symptoms Follow up as needed for continued or worsening symptoms

## 2018-02-01 NOTE — ED Provider Notes (Signed)
[facility]    CSN: [id number] Arrival date & time: 02/01/18  1501     History   Chief Complaint Chief Complaint  Patient presents with  . Hematuria    HPI Jeffery Young is a 52 y.o. male.   Pt is a 52 year old male with significant PMH to include, but not limited to ESRD. He presents with 2 days of urinary frequency, straining with urination, foul odor to urine and hematuria. He has also had some vague back pain. He does wear incontinent briefs and caretaker noted some pink tinge in the brief. No fever, chills, but he has been fatigued and blood sugars have been elevated. No nausea, vomiting, diarrhea. No abdominal pain. He does dialysis 3 days a week. He was recently seen in the ER on the 22nd and the 23rd. He was dx with viral illness. No cough, congestion, recent travels.   ROS per HPI    Hematuria     Past Medical History:  Diagnosis Date  . Chest pain 01/10/2016  . Chronic combined systolic and diastolic heart failure ([place])   . Coronary artery disease    a.12/22/15: NSTEMI s/p overlapping DES x2 and balloon angioplasty to distal AV groove Circumflex (too small for a stent).   . Coronary artery disease due to lipid rich plaque 01/10/2016  . Dehydration 08/22/2012  . Depression   . Elevated troponin   . ESRD (end stage renal disease) on dialysis ([place])   . Essential hypertension   . History of completed stroke 05/28/2017  . Hypercholesteremia   . Hypertension   . Hypertensive heart disease with heart failure ([place])   . Ischemic cardiomyopathy 03/13/2016  . Normocytic anemia 05/28/2017  . NSTEMI (non-ST elevated myocardial infarction) ([place]) 12/22/2015  . Prostate infection   . Status post coronary artery stent placement   . Syncope 03/13/2016  . Tobacco abuse   . Type 2 diabetes mellitus with diabetic nephropathy, without long-term current use of insulin ([place])   . Type II diabetes mellitus ([place])   . Uncontrolled hypertension 05/28/2017  . Unstable angina  pectoris ([place]) 01/11/2016    Patient Active Problem List   Diagnosis Date Noted  . COPD GOLD III  10/24/2017  . CAD (coronary artery disease), native coronary artery 10/10/2017  . ESRD (end stage renal disease) on dialysis ([place]) 06/25/2017  . Uncontrolled hypertension 05/28/2017  . Depression 05/28/2017  . Normocytic anemia 05/28/2017  . History of completed stroke 05/28/2017  . Syncope 03/13/2016  . Ischemic cardiomyopathy 03/13/2016  . Unstable angina pectoris ([place]) 01/11/2016  . Hypertensive heart disease with heart failure ([place])   . Chronic combined systolic and diastolic heart failure ([place])   . Elevated troponin   . Chest pain 01/10/2016  . Coronary artery disease due to lipid rich plaque 01/10/2016  . Status post coronary artery stent placement   . Type 2 diabetes mellitus with diabetic nephropathy, without long-term current use of insulin ([place])   . Tobacco abuse   . Essential hypertension   . Hypercholesteremia   . NSTEMI (non-ST elevated myocardial infarction) ([place]) 12/22/2015  . Lightheadedness 08/22/2012  . Dehydration 08/22/2012    Past Surgical History:  Procedure Laterality Date  . BASCILIC VEIN TRANSPOSITION Left 06/28/2017   Procedure: LEFT UPPER ARM ARTERIOVENOUS GORTEX-GRAFT PLACEMENT;  Surgeon: Angelia Mould, MD;  Location: [facility];  Service: Vascular;  Laterality: Left;  . CARDIAC CATHETERIZATION N/A 12/22/2015   Procedure: Left Heart Cath and Coronary Angiography;  Surgeon: Burnell Blanks,  MD;  Location: [facility];  Service: Cardiovascular;  Laterality: N/A;  . CARDIAC CATHETERIZATION N/A 12/22/2015   Procedure: Coronary Stent Intervention;  Surgeon: Burnell Blanks, MD;  Location: [facility];  Service: Cardiovascular;  Laterality: N/A;  . CARDIAC CATHETERIZATION N/A 01/11/2016   Procedure: Left Heart Cath and Coronary Angiography;  Surgeon: Sherren Mocha, MD;  Location: [facility];  Service: Cardiovascular;   Laterality: N/A;  . CARDIAC CATHETERIZATION N/A 01/11/2016   Procedure: Intravascular Pressure Wire/FFR Study;  Surgeon: Sherren Mocha, MD;  Location: [facility];  Service: Cardiovascular;  Laterality: N/A;  . CARDIAC CATHETERIZATION N/A 01/11/2016   Procedure: Coronary Stent Intervention;  Surgeon: Sherren Mocha, MD;  Location: [facility];  Service: Cardiovascular;  Laterality: N/A;  . CORONARY ANGIOPLASTY WITH STENT PLACEMENT  12/22/2015  . INSERTION OF DIALYSIS CATHETER Right 06/28/2017   Procedure: INSERTION OF DIALYSIS CATHETER RIGHT INTERNAL JUGULAR;  Surgeon: Angelia Mould, MD;  Location: [facility];  Service: Vascular;  Laterality: Right;       Home Medications    Prior to Admission medications   Medication Sig Start Date End Date Taking? Authorizing Provider  aspirin 81 MG chewable tablet Chew 1 tablet (81 mg total) by mouth daily. 08/15/17   [name], MD  atorvastatin (LIPITOR) 10 MG tablet Take 1 tablet (10 mg total) by mouth daily. 10/10/17   Lyda Jester M, PA-C  calcitRIOL (ROCALTROL) 0.5 MCG capsule TAKE 1 CAPSULE BY MOUTH ON MONDAY, WEDNESDAY, AND FRIDAY WITH HEMODIALYSIS 01/04/18   [name], MD  carvedilol (COREG) 6.25 MG tablet Take 1 tablet (6.25 mg total) by mouth 2 (two) times daily with a meal. 10/10/17   Lyda Jester M, PA-C  clopidogrel (PLAVIX) 75 MG tablet Take 1 tablet (75 mg total) by mouth daily. 10/10/17   Lyda Jester M, PA-C  insulin aspart (NOVOLOG) 100 UNIT/ML FlexPen Inject 0-10 Units into the skin 3 (three) times daily before meals. Sliding Scale: 0-150: 0u; 151-200: 2u; 201-250: 4u; 251-300: 6u; 301-350: 8u; 351-400:10u. 401+ Give 10u and call MD 10/03/17   [name], MD  Insulin Pen Needle (PEN NEEDLES) 32G X 4 MM MISC 1 Dose by Does not apply route 3 (three) times daily. Use with sliding scale novolog with meals. 10/03/17   [name], MD  multivitamin (RENA-VIT) TABS tablet Take 1 tablet by  mouth at bedtime. 08/15/17   [name], MD  Na Sulfate-K Sulfate-Mg Sulf 17.5-3.13-1.6 GM/177ML SOLN Take as directed for colonoscopy prep. 01/14/18   Levin Erp, PA  nitroGLYCERIN (NITROSTAT) 0.4 MG SL tablet Place 1 tablet (0.4 mg total) under the tongue every 5 (five) minutes x 3 doses as needed for chest pain. 12/26/15   Cheryln Manly, NP  ondansetron (ZOFRAN ODT) 4 MG disintegrating tablet Take 1 tablet (4 mg total) by mouth every 8 (eight) hours as needed for nausea or vomiting. 01/26/18   Horton, Barbette Hair, MD  ONE TOUCH LANCETS MISC [facility]H.  Please provide Verio test strips.  To test four times daily. 08/30/17   [name], MD  sertraline (ZOLOFT) 50 MG tablet Take 1 tablet (50 mg total) by mouth daily. 08/15/17   [name], MD  sevelamer carbonate (RENVELA) 800 MG tablet Take 1 tablet (800 mg total) by mouth 3 (three) times daily with meals. 08/15/17   [name], MD  STOOL SOFTENER 100 MG capsule TAKE ONE  CAPSULE BY MOUTH TWO TIMES A DAY 12/05/17   [name], MD  sulfamethoxazole-trimethoprim (BACTRIM DS,SEPTRA DS) 800-160 MG tablet Take 1 tablet by mouth 2 (two) times daily for 10 days. 02/01/18 02/11/18  Loura Halt A, NP  traZODone (DESYREL) 50 MG tablet Take 1 tablet (50 mg total) by mouth at bedtime. 08/15/17   [name], MD    Family History Family History  Problem Relation Age of Onset  . CAD Brother   . CVA Brother   . Chronic Renal Failure Brother 50       on HD  . CAD Brother     Social History Social History   Tobacco Use  . Smoking status: Former Smoker    Packs/day: 0.75    Years: 29.00    Pack years: 21.75    Types: Cigarettes    Last attempt to quit: 05/28/2017    Years since quitting: 0.6  . Smokeless tobacco: Never Used  Substance Use Topics  . Alcohol use: Not Currently    Alcohol/week: 7.0 standard drinks    Types: 7 Cans of beer per week    Frequency: Never    Comment:  last drink was prior to last hospitalization  . Drug use: No     Allergies   Patient has no known allergies.   Review of Systems Review of Systems  Genitourinary: Positive for hematuria.     Physical Exam Triage Vital Signs ED Triage Vitals  Enc Vitals Group     BP 02/01/18 1529 (!) 127/55     Pulse Rate 02/01/18 1529 79     Resp --      Temp 02/01/18 1529 98.1 F (36.7 C)     Temp Source 02/01/18 1529 Oral     SpO2 02/01/18 1529 92 %     Weight 02/01/18 1530 130 lb (59 kg)     Height 02/01/18 1530 5\' 5"  (1.651 m)     Head Circumference --      Peak Flow --      Pain Score 02/01/18 1529 0     Pain Loc --      Pain Edu? --      Excl. in [place]? --    No data found.  Updated Vital Signs BP (!) 127/55 (BP Location: Right Arm)   Pulse 79   Temp 98.1 F (36.7 C) (Oral)   Ht 5\' 5"  (1.651 m)   Wt 130 lb (59 kg)   SpO2 92%   BMI 21.63 kg/m   Visual Acuity Right Eye Distance:   Left Eye Distance:   Bilateral Distance:    Right Eye Near:   Left Eye Near:    Bilateral Near:     Physical Exam Vitals signs and nursing note reviewed.  Constitutional:      Appearance: He is not ill-appearing or toxic-appearing.  HENT:     Head: Normocephalic and atraumatic.     Nose: Nose normal.  Neck:     Musculoskeletal: Normal range of motion.  Pulmonary:     Effort: Pulmonary effort is normal.  Abdominal:     Palpations: Abdomen is soft.     Tenderness: There is no abdominal tenderness. There is no right CVA tenderness or left CVA tenderness.  Skin:    General: Skin is warm and dry.  Neurological:     Mental Status: He is alert.  Psychiatric:        Mood and Affect: Mood normal.  UC Treatments / Results  Labs (all labs ordered are listed, but only abnormal results are displayed) Labs Reviewed - No data to display  EKG None  Radiology No results found.  Procedures Procedures (including critical care time)  Medications Ordered in UC Medications - No  data to display  Initial Impression / Assessment and Plan / UC Course  I have reviewed the triage vital signs and the nursing notes.  Pertinent labs & imaging results that were available during my care of the patient were reviewed by me and considered in my medical decision making (see chart for details).     Pt here with multiple complaints that lead me to believe that he has a UTI. He was unable to provide a urine sample after multiple attempts. We will go ahead and treat for UTI with Bactrim.  Instructed to follow up with his doctor for continued or worsening symptoms..  Pt agreed.  Final Clinical Impressions(s) / UC Diagnoses   Final diagnoses:  Hematuria, unspecified type     Discharge Instructions     We are treating you for a urinary tract infection He will take the medicine twice a day for the next 10 days Please follow-up with your doctor next week for recheck or sooner for worsening symptoms Follow up as needed for continued or worsening symptoms     ED Prescriptions    Medication Sig Dispense Auth. Provider   sulfamethoxazole-trimethoprim (BACTRIM DS,SEPTRA DS) 800-160 MG tablet Take 1 tablet by mouth 2 (two) times daily for 10 days. 20 tablet Loura Halt A, NP     Controlled Substance Prescriptions [place] Controlled Substance Registry consulted? Not Applicable   Orvan July, NP 02/01/18 1757

## 2018-02-01 NOTE — ED Triage Notes (Signed)
Per pt's family she has been noticing blood spots in his brief. Pt says he has not been having any burning. Pt stated her does have trouble peeing.

## 2018-02-03 ENCOUNTER — Other Ambulatory Visit: Payer: Self-pay | Admitting: Family Medicine

## 2018-02-03 DIAGNOSIS — I509 Heart failure, unspecified: Secondary | ICD-10-CM

## 2018-02-03 DIAGNOSIS — N186 End stage renal disease: Secondary | ICD-10-CM

## 2018-02-03 DIAGNOSIS — F32A Depression, unspecified: Secondary | ICD-10-CM

## 2018-02-03 DIAGNOSIS — F329 Major depressive disorder, single episode, unspecified: Secondary | ICD-10-CM

## 2018-02-03 DIAGNOSIS — Z992 Dependence on renal dialysis: Principal | ICD-10-CM

## 2018-02-07 ENCOUNTER — Other Ambulatory Visit: Payer: Self-pay | Admitting: Family Medicine

## 2018-02-07 DIAGNOSIS — F32A Depression, unspecified: Secondary | ICD-10-CM

## 2018-02-07 DIAGNOSIS — F329 Major depressive disorder, single episode, unspecified: Secondary | ICD-10-CM

## 2018-02-07 DIAGNOSIS — I509 Heart failure, unspecified: Secondary | ICD-10-CM

## 2018-02-13 ENCOUNTER — Encounter: Payer: Self-pay | Admitting: Family Medicine

## 2018-02-13 ENCOUNTER — Ambulatory Visit: Payer: BC Managed Care – PPO | Admitting: Family Medicine

## 2018-02-13 ENCOUNTER — Ambulatory Visit (INDEPENDENT_AMBULATORY_CARE_PROVIDER_SITE_OTHER): Payer: BC Managed Care – PPO

## 2018-02-13 ENCOUNTER — Other Ambulatory Visit: Payer: Self-pay

## 2018-02-13 ENCOUNTER — Other Ambulatory Visit: Payer: Self-pay | Admitting: Family Medicine

## 2018-02-13 VITALS — BP 123/64 | HR 70 | Temp 98.7°F | Ht 64.0 in | Wt 124.6 lb

## 2018-02-13 DIAGNOSIS — Y92009 Unspecified place in unspecified non-institutional (private) residence as the place of occurrence of the external cause: Secondary | ICD-10-CM

## 2018-02-13 DIAGNOSIS — R310 Gross hematuria: Secondary | ICD-10-CM | POA: Diagnosis not present

## 2018-02-13 DIAGNOSIS — M25512 Pain in left shoulder: Secondary | ICD-10-CM

## 2018-02-13 DIAGNOSIS — N186 End stage renal disease: Secondary | ICD-10-CM

## 2018-02-13 DIAGNOSIS — W19XXXA Unspecified fall, initial encounter: Secondary | ICD-10-CM | POA: Diagnosis not present

## 2018-02-13 DIAGNOSIS — M62838 Other muscle spasm: Secondary | ICD-10-CM | POA: Diagnosis not present

## 2018-02-13 DIAGNOSIS — Z992 Dependence on renal dialysis: Principal | ICD-10-CM

## 2018-02-13 NOTE — Patient Instructions (Addendum)
As no further frequency, blood in urine,  or new urinary symptoms we can hold off on further testing for right now.  If you have any more burning with urination trouble controlling urine blood in the urine or any worsening or new symptoms be seen right away for repeat testing.  You can also discuss what may be normal amounts of urine and amount of fluid intake with your nephrologist.  I would recommend obtaining a urinalysis either with your nephrologist or can try to do this at our next visit in a few weeks to make sure there has been clearing of the blood.   See information below on fall prevention.  I will let you know if there are any concerns on your x-ray of your shoulder.  Neck pain is likely related to muscle spasm.  Okay to apply heat to the shoulder, Tylenol if needed for pain.  If shoulder pain does not improving in the next few days, or other new/worsening symptoms please be seen here or urgent care/ER if needed.  Follow-up in the next 3 weeks to review fall prevention further.    Return to the clinic or go to the nearest emergency room if any of your symptoms worsen or new symptoms occur.   Fall Prevention in the Home, Adult Falls can cause injuries. They can happen to people of all ages. There are many things you can do to make your home safe and to help prevent falls. Ask for help when making these changes, if needed. What actions can I take to prevent falls? General Instructions  Use good lighting in all rooms. Replace any light bulbs that burn out.  Turn on the lights when you go into a dark area. Use night-lights.  Keep items that you use often in easy-to-reach places. Lower the shelves around your home if necessary.  Set up your furniture so you have a clear path. Avoid moving your furniture around.  Do not have throw rugs and other things on the floor that can make you trip.  Avoid walking on wet floors.  If any of your floors are uneven, fix them.  Add color or  contrast paint or tape to clearly mark and help you see: ? Any grab bars or handrails. ? First and last steps of stairways. ? Where the edge of each step is.  If you use a stepladder: ? Make sure that it is fully opened. Do not climb a closed stepladder. ? Make sure that both sides of the stepladder are locked into place. ? Ask someone to hold the stepladder for you while you use it.  If there are any pets around you, be aware of where they are. What can I do in the bathroom?      Keep the floor dry. Clean up any water that spills onto the floor as soon as it happens.  Remove soap buildup in the tub or shower regularly.  Use non-skid mats or decals on the floor of the tub or shower.  Attach bath mats securely with double-sided, non-slip rug tape.  If you need to sit down in the shower, use a plastic, non-slip stool.  Install grab bars by the toilet and in the tub and shower. Do not use towel bars as grab bars. What can I do in the bedroom?  Make sure that you have a light by your bed that is easy to reach.  Do not use any sheets or blankets that are too big for your bed.  They should not hang down onto the floor.  Have a firm chair that has side arms. You can use this for support while you get dressed. What can I do in the kitchen?  Clean up any spills right away.  If you need to reach something above you, use a strong step stool that has a grab bar.  Keep electrical cords out of the way.  Do not use floor polish or wax that makes floors slippery. If you must use wax, use non-skid floor wax. What can I do with my stairs?  Do not leave any items on the stairs.  Make sure that you have a light switch at the top of the stairs and the bottom of the stairs. If you do not have them, ask someone to add them for you.  Make sure that there are handrails on both sides of the stairs, and use them. Fix handrails that are broken or loose. Make sure that handrails are as long as the  stairways.  Install non-slip stair treads on all stairs in your home.  Avoid having throw rugs at the top or bottom of the stairs. If you do have throw rugs, attach them to the floor with carpet tape.  Choose a carpet that does not hide the edge of the steps on the stairway.  Check any carpeting to make sure that it is firmly attached to the stairs. Fix any carpet that is loose or worn. What can I do on the outside of my home?  Use bright outdoor lighting.  Regularly fix the edges of walkways and driveways and fix any cracks.  Remove anything that might make you trip as you walk through a door, such as a raised step or threshold.  Trim any bushes or trees on the path to your home.  Regularly check to see if handrails are loose or broken. Make sure that both sides of any steps have handrails.  Install guardrails along the edges of any raised decks and porches.  Clear walking paths of anything that might make someone trip, such as tools or rocks.  Have any leaves, snow, or ice cleared regularly.  Use sand or salt on walking paths during winter.  Clean up any spills in your garage right away. This includes grease or oil spills. What other actions can I take?  Wear shoes that: ? Have a low heel. Do not wear high heels. ? Have rubber bottoms. ? Are comfortable and fit you well. ? Are closed at the toe. Do not wear open-toe sandals.  Use tools that help you move around (mobility aids) if they are needed. These include: ? Canes. ? Walkers. ? Scooters. ? Crutches.  Review your medicines with your doctor. Some medicines can make you feel dizzy. This can increase your chance of falling. Ask your doctor what other things you can do to help prevent falls. Where to find more information  Centers for Disease Control and Prevention, STEADI: https://garcia.biz/  [employer] on Aging: BrainJudge.co.uk Contact a doctor if:  You are afraid of falling at home.  You  feel weak, drowsy, or dizzy at home.  You fall at home. Summary  There are many simple things that you can do to make your home safe and to help prevent falls.  Ways to make your home safe include removing tripping hazards and installing grab bars in the bathroom.  Ask for help when making these changes in your home. This information is not intended to replace advice  given to you by your health care provider. Make sure you discuss any questions you have with your health care provider. Document Released: 11/18/2008 Document Revised: 09/06/2016 Document Reviewed: 09/06/2016 Elsevier Interactive Patient Education  [employer].   If you have lab work done today you will be contacted with your lab results within the next 2 weeks.  If you have not heard from [place] then please contact us. The fastest way to get your results is to register for My Chart.   IF you received an x-ray today, you will receive an invoice from [facility]. Please contact [facility] at [phone] with questions or concerns regarding your invoice.   IF you received labwork today, you will receive an invoice from [facility]. Please contact [facility] at [phone] with questions or concerns regarding your invoice.   Our billing staff will not be able to assist you with questions regarding bills from these companies.  You will be contacted with the lab results as soon as they are available. The fastest way to get your results is to activate your My Chart account. Instructions are located on the last page of this paperwork. If you have not heard from [place] regarding the results in 2 weeks, please contact this office.

## 2018-02-13 NOTE — Progress Notes (Signed)
Subjective:    Patient ID: Jeffery Young, male    DOB: 1965/02/06, 53 y.o.   MRN: [id number]  HPI  Jeffery Young is a 53 y.o. male Presents today for: Chief Complaint  Patient presents with  . Fall  . Hospitalization Follow-up    kidney infection?  . Dysuria   Hematuria: Seen 12 days ago, February 01, 2018 at [facility] urgent care.  2 days of urinary frequency, accompanied by straining with urination, foul odor as well as hematuria.  Vague back pain noted.  He has chronic kidney disease and is on hemodialysis 3 times per week.  Denied any abdominal pain, nausea, vomiting, diarrhea at that time.  Has been recovering from a suspected viral illness on the 22nd and 23rd of December.  Denied any cough or congestion at December 28 visit.  He was unable to provide a urine specimen at that visit but based on symptoms was treated with Bactrim for suspected UTI.  Typically urinates 2-3 times per day prior to illness, regular amounts. Frequency had increased to 4-5 times per day and had more urinary incontinence. Noticed blood in urine - pink appearance, no clots. No new back pain or extremity weakness. No saddle anesthesia. No stool incontinence. No recent catheterization.  No known prostate issues in past.  Finished antibiotic 2 days ago - no missed doses. Blood improved. Less urinary frequency now. Feels like back to prior urinary frequency.  No abd pain/back pain/n/v/fever. Feels back to normal.   Fall: Fell yesterday. Happened at home. Using walker, unsure of mechanism. No LOC, family heard noise - saw him on floor on top of walker. Alert. Some left shoulder pain at that time. No other areas of pain.  No ER/EMS treatment. No treatments.  Today - shoulder better. Moving without issue.   Neck pain: Neck started to hurt 2 days ago (before fall), NKI at that time. No known prior neck issue. No treatments.   December 22 visit in the ER reviewed.  Had reassuring x-ray, no leukocytosis, no signs of  pneumonia, negative flu testing.   Review of Systems Per HPI.     Objective:   Physical Exam Vitals signs reviewed.  Constitutional:      Appearance: He is well-developed.  HENT:     Head: Normocephalic and atraumatic.  Eyes:     Pupils: Pupils are equal, round, and reactive to light.  Neck:     Vascular: No carotid bruit or JVD.  Cardiovascular:     Rate and Rhythm: Normal rate and regular rhythm.     Heart sounds: Normal heart sounds. No murmur.  Pulmonary:     Effort: Pulmonary effort is normal.     Breath sounds: Normal breath sounds. No rales.  Abdominal:     General: Bowel sounds are normal.     Palpations: Abdomen is soft.     Tenderness: There is no abdominal tenderness. There is no right CVA tenderness or left CVA tenderness.  Musculoskeletal:     Left shoulder: He exhibits decreased range of motion (Minimal decreased internal rotation to approximately L4/5 versus lower thoracic back on the right.  Slight discomfort at terminal abduction, but otherwise full range of motion.) and decreased strength (Slight decreased strength with abduction but otherwise rotator cuff testing appeared to be intact.  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 He exhibits no tenderness and no bony tenderness (No [place]/AC tenderness.  Clavicle nontender.  No focal tenderness of proximal left shoulder.).  Cervical back: He exhibits spasm (Minimal spasm of the right paraspinal neck musculature.  No midline bony tenderness,.  ). He exhibits no tenderness and no bony tenderness.  Skin:    General: Skin is warm and dry.  Neurological:     Mental Status: He is alert and oriented to person, place, and time.    Vitals:   02/13/18 1145  BP: 123/64  Pulse: 70  Temp: 98.7 F (37.1 C)  TempSrc: Oral  SpO2: 96%  Weight: 124 lb 9.6 oz (56.5 kg)  Height: 5\' 4"  (1.626 m)  Dg Shoulder Left  Result Date: 02/13/2018 CLINICAL DATA:  Pain post fall. EXAM: LEFT SHOULDER - 2+ VIEW  COMPARISON:  None. FINDINGS: There is no evidence of fracture or dislocation. There is no evidence of arthropathy or other focal bone abnormality. Soft tissues are unremarkable. IMPRESSION: Negative. Electronically Signed   By: Fidela Salisbury M.D.   On: 02/13/2018 13:24       Assessment & Plan:   Jeffery Young is a 53 y.o. male Acute pain of left shoulder - Plan: DG Shoulder Left Muscle spasms of neck Fall in home, initial encounter  -History of prior neck spasms, recent fall. Mechanical fall suspected.  Improving shoulder pain, x-ray without sign of fracture.  No midline bony tenderness of cervical spine, imaging deferred per [facility] guidelines.  -Symptomatic care discussed, with RTC precautions if not continuing to improve  Handout on fall prevention, return if recurrence of falling, and can discuss it further at follow-up visit  Gross hematuria  -Suspected UTI based on symptoms prior, unable to obtain urinalysis both at previous visit at urgent care or in office today.  History of chronic kidney disease but is still producing urine.  Advised to discuss with his nephrologist typical expected amounts of urination.  Asymptomatic at present, no further treatment indicated at this time.  -At some point recommend repeat urinalysis to document clearing of hematuria.  No orders of the defined types were placed in this encounter.  Patient Instructions   As no further frequency, blood in urine,  or new urinary symptoms we can hold off on further testing for right now.  If you have any more burning with urination trouble controlling urine blood in the urine or any worsening or new symptoms be seen right away for repeat testing.  You can also discuss what may be normal amounts of urine and amount of fluid intake with your nephrologist.  I would recommend obtaining a urinalysis either with your nephrologist or can try to do this at our next visit in a few weeks to make sure there has been clearing of  the blood.   See information below on fall prevention.  I will let you know if there are any concerns on your x-ray of your shoulder.  Neck pain is likely related to muscle spasm.  Okay to apply heat to the shoulder, Tylenol if needed for pain.  If shoulder pain does not improving in the next few days, or other new/worsening symptoms please be seen here or urgent care/ER if needed.  Follow-up in the next 3 weeks to review fall prevention further.    Return to the clinic or go to the nearest emergency room if any of your symptoms worsen or new symptoms occur.   Fall Prevention in the Home, Adult Falls can cause injuries. They can happen to people of all ages. There are many things you can do to make your home safe and to help prevent  falls. Ask for help when making these changes, if needed. What actions can I take to prevent falls? General Instructions  Use good lighting in all rooms. Replace any light bulbs that burn out.  Turn on the lights when you go into a dark area. Use night-lights.  Keep items that you use often in easy-to-reach places. Lower the shelves around your home if necessary.  Set up your furniture so you have a clear path. Avoid moving your furniture around.  Do not have throw rugs and other things on the floor that can make you trip.  Avoid walking on wet floors.  If any of your floors are uneven, fix them.  Add color or contrast paint or tape to clearly mark and help you see: ? Any grab bars or handrails. ? First and last steps of stairways. ? Where the edge of each step is.  If you use a stepladder: ? Make sure that it is fully opened. Do not climb a closed stepladder. ? Make sure that both sides of the stepladder are locked into place. ? Ask someone to hold the stepladder for you while you use it.  If there are any pets around you, be aware of where they are. What can I do in the bathroom?      Keep the floor dry. Clean up any water that spills onto  the floor as soon as it happens.  Remove soap buildup in the tub or shower regularly.  Use non-skid mats or decals on the floor of the tub or shower.  Attach bath mats securely with double-sided, non-slip rug tape.  If you need to sit down in the shower, use a plastic, non-slip stool.  Install grab bars by the toilet and in the tub and shower. Do not use towel bars as grab bars. What can I do in the bedroom?  Make sure that you have a light by your bed that is easy to reach.  Do not use any sheets or blankets that are too big for your bed. They should not hang down onto the floor.  Have a firm chair that has side arms. You can use this for support while you get dressed. What can I do in the kitchen?  Clean up any spills right away.  If you need to reach something above you, use a strong step stool that has a grab bar.  Keep electrical cords out of the way.  Do not use floor polish or wax that makes floors slippery. If you must use wax, use non-skid floor wax. What can I do with my stairs?  Do not leave any items on the stairs.  Make sure that you have a light switch at the top of the stairs and the bottom of the stairs. If you do not have them, ask someone to add them for you.  Make sure that there are handrails on both sides of the stairs, and use them. Fix handrails that are broken or loose. Make sure that handrails are as long as the stairways.  Install non-slip stair treads on all stairs in your home.  Avoid having throw rugs at the top or bottom of the stairs. If you do have throw rugs, attach them to the floor with carpet tape.  Choose a carpet that does not hide the edge of the steps on the stairway.  Check any carpeting to make sure that it is firmly attached to the stairs. Fix any carpet that is loose or worn. What  can I do on the outside of my home?  Use bright outdoor lighting.  Regularly fix the edges of walkways and driveways and fix any cracks.  Remove  anything that might make you trip as you walk through a door, such as a raised step or threshold.  Trim any bushes or trees on the path to your home.  Regularly check to see if handrails are loose or broken. Make sure that both sides of any steps have handrails.  Install guardrails along the edges of any raised decks and porches.  Clear walking paths of anything that might make someone trip, such as tools or rocks.  Have any leaves, snow, or ice cleared regularly.  Use sand or salt on walking paths during winter.  Clean up any spills in your garage right away. This includes grease or oil spills. What other actions can I take?  Wear shoes that: ? Have a low heel. Do not wear high heels. ? Have rubber bottoms. ? Are comfortable and fit you well. ? Are closed at the toe. Do not wear open-toe sandals.  Use tools that help you move around (mobility aids) if they are needed. These include: ? Canes. ? Walkers. ? Scooters. ? Crutches.  Review your medicines with your doctor. Some medicines can make you feel dizzy. This can increase your chance of falling. Ask your doctor what other things you can do to help prevent falls. Where to find more information  Centers for Disease Control and Prevention, STEADI: https://garcia.biz/  [employer] on Aging: BrainJudge.co.uk Contact a doctor if:  You are afraid of falling at home.  You feel weak, drowsy, or dizzy at home.  You fall at home. Summary  There are many simple things that you can do to make your home safe and to help prevent falls.  Ways to make your home safe include removing tripping hazards and installing grab bars in the bathroom.  Ask for help when making these changes in your home. This information is not intended to replace advice given to you by your health care provider. Make sure you discuss any questions you have with your health care provider. Document Released: 11/18/2008 Document Revised:  09/06/2016 Document Reviewed: 09/06/2016 Elsevier Interactive Patient Education  [employer].   If you have lab work done today you will be contacted with your lab results within the next 2 weeks.  If you have not heard from [place] then please contact us. The fastest way to get your results is to register for My Chart.   IF you received an x-ray today, you will receive an invoice from [facility]. Please contact [facility] at [phone] with questions or concerns regarding your invoice.   IF you received labwork today, you will receive an invoice from [facility]. Please contact [facility] at [phone] with questions or concerns regarding your invoice.   Our billing staff will not be able to assist you with questions regarding bills from these companies.  You will be contacted with the lab results as soon as they are available. The fastest way to get your results is to activate your My Chart account. Instructions are located on the last page of this paperwork. If you have not heard from [place] regarding the results in 2 weeks, please contact this office.       Signed,   Merri Ray, MD [facility] [place].  02/16/18 12:29 PM

## 2018-02-16 ENCOUNTER — Encounter: Payer: Self-pay | Admitting: Family Medicine

## 2018-03-06 ENCOUNTER — Encounter: Payer: Self-pay | Admitting: Family Medicine

## 2018-03-06 ENCOUNTER — Other Ambulatory Visit: Payer: Self-pay

## 2018-03-06 ENCOUNTER — Ambulatory Visit (INDEPENDENT_AMBULATORY_CARE_PROVIDER_SITE_OTHER): Payer: BC Managed Care – PPO | Admitting: Family Medicine

## 2018-03-06 VITALS — BP 142/62 | HR 75 | Temp 98.7°F | Ht 64.0 in | Wt 126.8 lb

## 2018-03-06 DIAGNOSIS — Z9181 History of falling: Secondary | ICD-10-CM | POA: Diagnosis not present

## 2018-03-06 DIAGNOSIS — N186 End stage renal disease: Secondary | ICD-10-CM

## 2018-03-06 DIAGNOSIS — E1122 Type 2 diabetes mellitus with diabetic chronic kidney disease: Secondary | ICD-10-CM

## 2018-03-06 DIAGNOSIS — Z992 Dependence on renal dialysis: Secondary | ICD-10-CM

## 2018-03-06 DIAGNOSIS — I6930 Unspecified sequelae of cerebral infarction: Secondary | ICD-10-CM

## 2018-03-06 NOTE — Patient Instructions (Addendum)
I will check the hemoglobin A1c test to decide if insulin is even needed.  I suspect we can get by without further insulin in the future and look at daily medication depending on A1c result.  At some point I would recommend repeat urine testing to make sure the blood has cleared.  This can also be discussed with his nephrologist.  If any fevers, abdominal pain, or new urinary symptoms, would evaluate for repeat infection.  With history of falling I did place a referral to home health physical therapy and referral to care management to evaluate for possible social work assistance in placement for assisted living or nursing home facilities.  Once I have his lab work we can discuss details of [place] form next visit.  Thank you for coming in today.    Return to the clinic or go to the nearest emergency room if any of your symptoms worsen or new symptoms occur.  If you have lab work done today you will be contacted with your lab results within the next 2 weeks.  If you have not heard from [place] then please contact us. The fastest way to get your results is to register for My Chart.   IF you received an x-ray today, you will receive an invoice from [facility]. Please contact [facility] at [phone] with questions or concerns regarding your invoice.   IF you received labwork today, you will receive an invoice from [place]. Please contact [facility] at [phone] with questions or concerns regarding your invoice.   Our billing staff will not be able to assist you with questions regarding bills from these companies.  You will be contacted with the lab results as soon as they are available. The fastest way to get your results is to activate your My Chart account. Instructions are located on the last page of this paperwork. If you have not heard from [place] regarding the results in 2 weeks, please contact this office.

## 2018-03-06 NOTE — Progress Notes (Signed)
Subjective:    Patient ID: Jeffery Young, male    DOB: 23-Jun-1965, 53 y.o.   MRN: [id number]  HPI [name] is a 53 y.o. male Presents today for: Chief Complaint  Patient presents with  . fall prevention  . Dysuria    f/u   . FL 2 form    to be completed   Hx of falls.  Here for follow-up from January 9 visit.  Was experiencing left shoulder pain at that time after previous mechanical fall.  Symptomatic care discussed with RTC precautions.  We did discuss fall prevention at that visit along with handout given  Here with ex-wife.   Had a fall after coming back from dialysis some day last week(M/W/F), was walking up the steps at home. Using a handrail.  On landing, handed over his coat - losing balance going up the next set of stairs.  Caught with some assistance and fell onto landing. No injuries known. No recent PT eval - last PT for few weeks in June of last year. Residual left sided weakness.    Living with ex-wife temporarily until can be placed in nursing home. Has FL2 to complete. Unknown facility.   Lab Results  Component Value Date   HGBA1C 6.8 (H) 09/05/2017   Hx of DM - home readings ok - only had to use insulin a few times. Not over 200. Lowest 80. No symptomatic lows. Has been given insulin only for sliding scale when readings in high 100's. (2-4 units) BP Readings from Last 3 Encounters:  03/06/18 (!) 149/73  02/13/18 123/64  02/01/18 (!) 127/55   Hematuria: See last visit, suspected UTI based on prior symptoms.  Unable to obtain urinalysis at the previous urgent care or an office with me last visit.  He does have a history of chronic kidney disease but is still producing urine.  He was asymptomatic at last visit, no further treatment was indicated at that time.  Recommended he discuss with his nephrologist typical amounts of urination expected given his chronic kidney disease on dialysis.  I did recommend a repeat urinalysis at some point to make sure he had clearing  of blood.  No visible hematuria. No fever, no abd pain.   FL2 completion - see above.    Patient Active Problem List   Diagnosis Date Noted  . COPD GOLD III  10/24/2017  . CAD (coronary artery disease), native coronary artery 10/10/2017  . ESRD (end stage renal disease) on dialysis ([place]) 06/25/2017  . Uncontrolled hypertension 05/28/2017  . Depression 05/28/2017  . Normocytic anemia 05/28/2017  . History of completed stroke 05/28/2017  . Syncope 03/13/2016  . Ischemic cardiomyopathy 03/13/2016  . Unstable angina pectoris ([place]) 01/11/2016  . Hypertensive heart disease with heart failure ([place])   . Chronic combined systolic and diastolic heart failure ([place])   . Elevated troponin   . Chest pain 01/10/2016  . Coronary artery disease due to lipid rich plaque 01/10/2016  . Status post coronary artery stent placement   . Type 2 diabetes mellitus with diabetic nephropathy, without long-term current use of insulin ([place])   . Tobacco abuse   . Essential hypertension   . Hypercholesteremia   . NSTEMI (non-ST elevated myocardial infarction) ([place]) 12/22/2015  . Lightheadedness 08/22/2012  . Dehydration 08/22/2012   Past Medical History:  Diagnosis Date  . Chest pain 01/10/2016  . Chronic combined systolic and diastolic heart failure ([place])   . Coronary artery disease    a.12/22/15: NSTEMI s/p  overlapping DES x2 and balloon angioplasty to distal AV groove Circumflex (too small for a stent).   . Coronary artery disease due to lipid rich plaque 01/10/2016  . Dehydration 08/22/2012  . Depression   . Elevated troponin   . ESRD (end stage renal disease) on dialysis ([place])   . Essential hypertension   . History of completed stroke 05/28/2017  . Hypercholesteremia   . Hypertension   . Hypertensive heart disease with heart failure ([place])   . Ischemic cardiomyopathy 03/13/2016  . Normocytic anemia 05/28/2017  . NSTEMI (non-ST elevated myocardial infarction) ([place]) 12/22/2015  . Prostate infection   .  Status post coronary artery stent placement   . Syncope 03/13/2016  . Tobacco abuse   . Type 2 diabetes mellitus with diabetic nephropathy, without long-term current use of insulin ([place])   . Type II diabetes mellitus ([place])   . Uncontrolled hypertension 05/28/2017  . Unstable angina pectoris ([place]) 01/11/2016   Past Surgical History:  Procedure Laterality Date  . BASCILIC VEIN TRANSPOSITION Left 06/28/2017   Procedure: LEFT UPPER ARM ARTERIOVENOUS GORTEX-GRAFT PLACEMENT;  Surgeon: Angelia Mould, MD;  Location: [facility];  Service: Vascular;  Laterality: Left;  . CARDIAC CATHETERIZATION N/A 12/22/2015   Procedure: Left Heart Cath and Coronary Angiography;  Surgeon: Burnell Blanks, MD;  Location: [facility];  Service: Cardiovascular;  Laterality: N/A;  . CARDIAC CATHETERIZATION N/A 12/22/2015   Procedure: Coronary Stent Intervention;  Surgeon: Burnell Blanks, MD;  Location: [facility];  Service: Cardiovascular;  Laterality: N/A;  . CARDIAC CATHETERIZATION N/A 01/11/2016   Procedure: Left Heart Cath and Coronary Angiography;  Surgeon: Sherren Mocha, MD;  Location: [facility];  Service: Cardiovascular;  Laterality: N/A;  . CARDIAC CATHETERIZATION N/A 01/11/2016   Procedure: Intravascular Pressure Wire/FFR Study;  Surgeon: Sherren Mocha, MD;  Location: [facility];  Service: Cardiovascular;  Laterality: N/A;  . CARDIAC CATHETERIZATION N/A 01/11/2016   Procedure: Coronary Stent Intervention;  Surgeon: Sherren Mocha, MD;  Location: [facility];  Service: Cardiovascular;  Laterality: N/A;  . CORONARY ANGIOPLASTY WITH STENT PLACEMENT  12/22/2015  . INSERTION OF DIALYSIS CATHETER Right 06/28/2017   Procedure: INSERTION OF DIALYSIS CATHETER RIGHT INTERNAL JUGULAR;  Surgeon: Angelia Mould, MD;  Location: [facility];  Service: Vascular;  Laterality: Right;   No Known Allergies Prior to Admission medications   Medication Sig Start Date End Date  Taking? Authorizing Provider  aspirin 81 MG chewable tablet CHEW 1 TABLET BY MOUTH DAILY 02/07/18  Yes [name], MD  atorvastatin (LIPITOR) 10 MG tablet Take 1 tablet (10 mg total) by mouth daily. 10/10/17  Yes Simmons, Brittainy M, PA-C  B Complex-C-Folic Acid (RENA-VITE RX) 1 MG TABS TAKE ONE TABLET BY MOUTH AT BEDTIME 02/13/18  Yes [name], MD  calcitRIOL (ROCALTROL) 0.5 MCG capsule TAKE 1 CAPSULE BY MOUTH ON MONDAY, WEDNESDAY, AND FRIDAY WITH HEMODIALYSIS 01/04/18  Yes [name], MD  carvedilol (COREG) 6.25 MG tablet Take 1 tablet (6.25 mg total) by mouth 2 (two) times daily with a meal. 10/10/17  Yes Lyda Jester M, PA-C  clopidogrel (PLAVIX) 75 MG tablet Take 1 tablet (75 mg total) by mouth daily. 10/10/17  Yes Rosita Fire, Brittainy M, PA-C  insulin aspart (NOVOLOG) 100 UNIT/ML FlexPen Inject 0-10 Units into the skin 3 (three) times daily before meals. Sliding Scale: 0-150: 0u; 151-200: 2u; 201-250: 4u; 251-300: 6u; 301-350: 8u; 351-400:10u. 401+ Give 10u and call MD 10/03/17  Yes [name], MD  Insulin Pen Needle (PEN NEEDLES) 32G X 4 MM MISC 1 Dose by Does not apply route 3 (three) times daily. Use with sliding scale novolog with meals. 10/03/17  Yes [name], MD  Lancets [facility] DELICA PLUS HQIONG29B) MISC USE ONE LANCET TO TEST FOUR TIMES A DAY 02/04/18  Yes [name], MD  Na Sulfate-K Sulfate-Mg Sulf 17.5-3.13-1.6 GM/177ML SOLN Take as directed for colonoscopy prep. 01/14/18  Yes Levin Erp, PA  nitroGLYCERIN (NITROSTAT) 0.4 MG SL tablet Place 1 tablet (0.4 mg total) under the tongue every 5 (five) minutes x 3 doses as needed for chest pain. 12/26/15  Yes Cheryln Manly, NP  ondansetron (ZOFRAN ODT) 4 MG disintegrating tablet Take 1 tablet (4 mg total) by mouth every 8 (eight) hours as needed for nausea or vomiting. 01/26/18  Yes Horton, Barbette Hair, MD  sevelamer carbonate (RENVELA) 800 MG tablet Take 1 tablet (800 mg total) by  mouth 3 (three) times daily with meals. 08/15/17  Yes [name], MD  STOOL SOFTENER 100 MG capsule TAKE ONE CAPSULE BY MOUTH TWO TIMES A DAY 02/04/18  Yes [name], MD  traZODone (DESYREL) 50 MG tablet TAKE ONE TABLET BY MOUTH AT BEDTIME 02/07/18  Yes [name], MD   Social History   Socioeconomic History  . Marital status: Married    Spouse name: Not on file  . Number of children: Not on file  . Years of education: Not on file  . Highest education level: Not on file  Occupational History  . Occupation: International aid/development worker: [employer]  Social Needs  . Financial resource strain: Not on file  . Food insecurity:    Worry: Not on file    Inability: Not on file  . Transportation needs:    Medical: Not on file    Non-medical: Not on file  Tobacco Use  . Smoking status: Former Smoker    Packs/day: 0.75    Years: 29.00    Pack years: 21.75    Types: Cigarettes    Last attempt to quit: 05/28/2017    Years since quitting: 0.7  . Smokeless tobacco: Never Used  Substance and Sexual Activity  . Alcohol use: Not Currently    Alcohol/week: 7.0 standard drinks    Types: 7 Cans of beer per week    Frequency: Never    Comment: last drink was prior to last hospitalization  . Drug use: No  . Sexual activity: Not Currently  Lifestyle  . Physical activity:    Days per week: Not on file    Minutes per session: Not on file  . Stress: Not on file  Relationships  . Social connections:    Talks on phone: Not on file    Gets together: Not on file    Attends religious service: Not on file    Active member of club or organization: Not on file    Attends meetings of clubs or organizations: Not on file    Relationship status: Not on file  . Intimate partner violence:    Fear of current or ex partner: Not on file    Emotionally abused: Not on file    Physically abused: Not on file    Forced sexual activity: Not on file  Other Topics Concern  . Not on file    Social History Narrative  . Not on file    Review of Systems  Per HPI.     Objective:   Physical Exam Vitals signs reviewed.  Constitutional:      Appearance: He is well-developed.  HENT:     Head: Normocephalic and atraumatic.  Eyes:     Pupils: Pupils are equal, round, and reactive to light.  Neck:     Vascular: No carotid bruit or JVD.  Cardiovascular:     Rate and Rhythm: Normal rate and regular rhythm.     Heart sounds: Normal heart sounds. No murmur.  Pulmonary:     Effort: Pulmonary effort is normal.     Breath sounds: Normal breath sounds. No rales.  Skin:    General: Skin is warm and dry.  Neurological:     Mental Status: He is alert and oriented to person, place, and time.    Vitals:   03/06/18 1433 03/06/18 1528  BP: (!) 149/73 (!) 142/62  Pulse: 75   Temp: 98.7 F (37.1 C)   TempSrc: Oral   SpO2: 95%   Weight: 126 lb 12.8 oz (57.5 kg)   Height: 5\' 4"  (1.626 m)        Assessment & Plan:  Atharva Mirsky is a 53 y.o. male History of fall - Plan: AMB Referral to [facility], Ambulatory referral to [facility] History of CVA with residual deficit - Plan: AMB Referral to [facility], Ambulatory referral to [facility] living status, potentially was looking into nursing home as assisted living would not support use of sliding scale insulin.  He has used very little sliding scale insulin, and with updated A1c likely can discontinue.  Plans to follow-up early next week to review FL 2 form further we can discuss medication options at that time.  -We will refer to home health physical therapy to help with fall prevention/strengthening given history of CVA with residual defect.  Fall prevention was discussed at previous visit including handout given, but reinforced need to use assistive devices  Type 2 diabetes mellitus with chronic kidney disease on chronic dialysis, without long-term current use of insulin ([place]) - Plan: Hemoglobin  A1c  -As above, likely can come off sliding scale insulin.  Repeat A1c overall stable.   Previous suspected UTI resolved.  Did recommend repeating urinalysis to evaluate for hematuria but unable to provide urine in office.  Advised him to discuss with his nephrologist at next dialysis session.  RTC precautions given if new urinary symptoms such as fever, or abdominal pain . No orders of the defined types were placed in this encounter.  Patient Instructions   I will check the hemoglobin A1c test to decide if insulin is even needed.  I suspect we can get by without further insulin in the future and look at daily medication depending on A1c result.  At some point I would recommend repeat urine testing to make sure the blood has cleared.  This can also be discussed with his nephrologist.  If any fevers, abdominal pain, or new urinary symptoms, would evaluate for repeat infection.  With history of falling I did place a referral to home health physical therapy and referral to care management to evaluate for possible social work assistance in placement for assisted living or nursing home facilities.  Once I have his lab work we can discuss details of [place] form next visit.  Thank you for coming in today.    Return to the clinic or go to the nearest emergency room if any of your symptoms worsen or new  symptoms occur.  If you have lab work done today you will be contacted with your lab results within the next 2 weeks.  If you have not heard from [place] then please contact us. The fastest way to get your results is to register for My Chart.   IF you received an x-ray today, you will receive an invoice from [facility]. Please contact [facility] at [phone] with questions or concerns regarding your invoice.   IF you received labwork today, you will receive an invoice from [facility]. Please contact [facility] at [phone] with questions or concerns regarding your invoice.   Our billing  staff will not be able to assist you with questions regarding bills from these companies.  You will be contacted with the lab results as soon as they are available. The fastest way to get your results is to activate your My Chart account. Instructions are located on the last page of this paperwork. If you have not heard from [place] regarding the results in 2 weeks, please contact this office.       Signed,   Merri Ray, MD [facility].  03/08/18 10:47 PM

## 2018-03-07 ENCOUNTER — Telehealth: Payer: Self-pay | Admitting: Family Medicine

## 2018-03-07 LAB — HEMOGLOBIN A1C
Est. average glucose Bld gHb Est-mCnc: 146 mg/dL
Hgb A1c MFr Bld: 6.7 % — ABNORMAL HIGH (ref 4.8–5.6)

## 2018-03-07 NOTE — Telephone Encounter (Signed)
PLEASE ADV ON WHAT TYPE OF FACILITY THE PATIENT IS NEEDING..  [facility] DENIED DUE TO HIS INSURANCE PLAN IS NOT ON THE ROSTER THEY RECEIVE EVERY FEW WEEKS PER NICOLA Jimmye Norman  03/07/18

## 2018-03-07 NOTE — Telephone Encounter (Signed)
Please advise 

## 2018-03-07 NOTE — Telephone Encounter (Signed)
----   Message ----- From: Darvin Neighbours Sent: 03/06/2018   3:43 PM EST To: [name], MD Subject: Referral Denial                                Good afternoon,   Thank you for this referral.  Unfortunately, our Care Management Team will not be able to assist you with this patient.  The patient is not on the current member enrollment roster for any of the [facility] risk contracted plans. The payer plans send [place] this roster regularly and we have checked the list for this member.  Please call us if you need further clarification at [phone].  Thank you,  [facility] Care Management Assistant

## 2018-03-07 NOTE — Telephone Encounter (Signed)
Will discuss with pt/family further next week at appt. Was looking for SW assistance in placement in nursing vs assisted living but that will depend on insulin need.

## 2018-03-07 NOTE — Telephone Encounter (Signed)
Please see if he could be seen at another Facility.

## 2018-03-07 NOTE — Telephone Encounter (Signed)
CALLED NICOLA WILLIAMS WITH [facility] TO GET SOME CLARIFICATION ON THE DENIAL 03/07/2018

## 2018-03-08 ENCOUNTER — Encounter: Payer: Self-pay | Admitting: Family Medicine

## 2018-03-11 ENCOUNTER — Ambulatory Visit: Payer: BC Managed Care – PPO | Admitting: Family Medicine

## 2018-03-11 ENCOUNTER — Encounter: Payer: Self-pay | Admitting: Family Medicine

## 2018-03-11 ENCOUNTER — Other Ambulatory Visit: Payer: Self-pay

## 2018-03-11 VITALS — BP 120/68 | HR 85 | Temp 99.2°F | Resp 17 | Ht 64.0 in | Wt 128.8 lb

## 2018-03-11 DIAGNOSIS — R300 Dysuria: Secondary | ICD-10-CM

## 2018-03-11 DIAGNOSIS — Z992 Dependence on renal dialysis: Secondary | ICD-10-CM

## 2018-03-11 DIAGNOSIS — E1122 Type 2 diabetes mellitus with diabetic chronic kidney disease: Secondary | ICD-10-CM | POA: Diagnosis not present

## 2018-03-11 DIAGNOSIS — Z794 Long term (current) use of insulin: Secondary | ICD-10-CM

## 2018-03-11 DIAGNOSIS — N186 End stage renal disease: Secondary | ICD-10-CM

## 2018-03-11 LAB — POCT URINALYSIS DIP (MANUAL ENTRY)
Bilirubin, UA: NEGATIVE
Glucose, UA: 100 mg/dL — AB
Ketones, POC UA: NEGATIVE mg/dL
Nitrite, UA: NEGATIVE
Protein Ur, POC: >=300 mg/dL — AB
Spec Grav, UA: 1.02
Urobilinogen, UA: 0.2 U/dL
pH, UA: 7

## 2018-03-11 LAB — POC MICROSCOPIC URINALYSIS (UMFC): Mucus: ABSENT

## 2018-03-11 NOTE — Progress Notes (Signed)
Subjective:    Patient ID: Jeffery Young, male    DOB: [birth year], 53 y.o.   MRN: [id number]  HPI Jeffery Young is a 53 y.o. male Presents today for: Chief Complaint  Patient presents with  . Diabetes    pt wants urinalysis due to foul smell per his wife  . FL2 paperwork   See prior visit.  History of diabetes, but question whether or not he needed to still remain on sliding scale insulin.  Diet has improved control with rare use of sliding scale.  Had glucose reading of 209 once since last visit requiring sliding scale 1 time.  Goes to dialysis 3 times per week. Ex wife has noticed odor in urine. Noticed dysuria occasionally. Noticed blood in urine a few days ago - here with dtr. No abdominal  pain, no fever.  Has not been able to provide specimen past few visits and has not discussed with nephrologist.    Lab Results  Component Value Date   HGBA1C 6.7 (H) 03/06/2018    Plan to review FL 2 today for determining location of placement.  Did refer to physical therapy due to history of falls previously, residual weakness from CVA.   Patient Active Problem List   Diagnosis Date Noted  . COPD GOLD III  10/24/2017  . CAD (coronary artery disease), native coronary artery 10/10/2017  . ESRD (end stage renal disease) on dialysis ([place]) 06/25/2017  . Uncontrolled hypertension 05/28/2017  . Depression 05/28/2017  . Normocytic anemia 05/28/2017  . History of completed stroke 05/28/2017  . Syncope 03/13/2016  . Ischemic cardiomyopathy 03/13/2016  . Unstable angina pectoris ([place]) 01/11/2016  . Hypertensive heart disease with heart failure ([place])   . Chronic combined systolic and diastolic heart failure ([place])   . Elevated troponin   . Chest pain 01/10/2016  . Coronary artery disease due to lipid rich plaque 01/10/2016  . Status post coronary artery stent placement   . Type 2 diabetes mellitus with diabetic nephropathy, without long-term current use of insulin ([place])   . Tobacco abuse     . Essential hypertension   . Hypercholesteremia   . NSTEMI (non-ST elevated myocardial infarction) ([place]) 12/22/2015  . Lightheadedness 08/22/2012  . Dehydration 08/22/2012   Past Medical History:  Diagnosis Date  . Chest pain 01/10/2016  . Chronic combined systolic and diastolic heart failure ([place])   . Coronary artery disease    a.12/22/15: NSTEMI s/p overlapping DES x2 and balloon angioplasty to distal AV groove Circumflex (too small for a stent).   . Coronary artery disease due to lipid rich plaque 01/10/2016  . Dehydration 08/22/2012  . Depression   . Elevated troponin   . ESRD (end stage renal disease) on dialysis ([place])   . Essential hypertension   . History of completed stroke 05/28/2017  . Hypercholesteremia   . Hypertension   . Hypertensive heart disease with heart failure ([place])   . Ischemic cardiomyopathy 03/13/2016  . Normocytic anemia 05/28/2017  . NSTEMI (non-ST elevated myocardial infarction) ([place]) 12/22/2015  . Prostate infection   . Status post coronary artery stent placement   . Syncope 03/13/2016  . Tobacco abuse   . Type 2 diabetes mellitus with diabetic nephropathy, without long-term current use of insulin ([place])   . Type II diabetes mellitus ([place])   . Uncontrolled hypertension 05/28/2017  . Unstable angina pectoris ([place]) 01/11/2016   Past Surgical History:  Procedure Laterality Date  . BASCILIC VEIN TRANSPOSITION Left 06/28/2017   Procedure: LEFT UPPER  ARM ARTERIOVENOUS GORTEX-GRAFT PLACEMENT;  Surgeon: Angelia Mould, MD;  Location: [facility];  Service: Vascular;  Laterality: Left;  . CARDIAC CATHETERIZATION N/A 12/22/2015   Procedure: Left Heart Cath and Coronary Angiography;  Surgeon: Burnell Blanks, MD;  Location: [place] [facility];  Service: Cardiovascular;  Laterality: N/A;  . CARDIAC CATHETERIZATION N/A 12/22/2015   Procedure: Coronary Stent Intervention;  Surgeon: Burnell Blanks, MD;  Location: [place] [facility];  Service: Cardiovascular;   Laterality: N/A;  . CARDIAC CATHETERIZATION N/A 01/11/2016   Procedure: Left Heart Cath and Coronary Angiography;  Surgeon: Sherren Mocha, MD;  Location: [place] [facility];  Service: Cardiovascular;  Laterality: N/A;  . CARDIAC CATHETERIZATION N/A 01/11/2016   Procedure: Intravascular Pressure Wire/FFR Study;  Surgeon: Sherren Mocha, MD;  Location: [place] [facility];  Service: Cardiovascular;  Laterality: N/A;  . CARDIAC CATHETERIZATION N/A 01/11/2016   Procedure: Coronary Stent Intervention;  Surgeon: Sherren Mocha, MD;  Location: [place] [facility];  Service: Cardiovascular;  Laterality: N/A;  . CORONARY ANGIOPLASTY WITH STENT PLACEMENT  12/22/2015  . INSERTION OF DIALYSIS CATHETER Right 06/28/2017   Procedure: INSERTION OF DIALYSIS CATHETER RIGHT INTERNAL JUGULAR;  Surgeon: Angelia Mould, MD;  Location[facility];  Service: Vascular;  Laterality: Right;   No Known Allergies Prior to Admission medications   Medication Sig Start Date End Date Taking? Authorizing Provider  aspirin 81 MG chewable tablet CHEW 1 TABLET BY MOUTH DAILY 02/07/18  Yes [name], MD  atorvastatin (LIPITOR) 10 MG tablet Take 1 tablet (10 mg total) by mouth daily. 10/10/17  Yes Simmons, Brittainy M, PA-C  B Complex-C-Folic Acid (RENA-VITE RX) 1 MG TABS TAKE ONE TABLET BY MOUTH AT BEDTIME 02/13/18  Yes [name], MD  calcitRIOL (ROCALTROL) 0.5 MCG capsule TAKE 1 CAPSULE BY MOUTH ON MONDAY, WEDNESDAY, AND FRIDAY WITH HEMODIALYSIS 01/04/18  Yes [name], MD  carvedilol (COREG) 6.25 MG tablet Take 1 tablet (6.25 mg total) by mouth 2 (two) times daily with a meal. 10/10/17  Yes Lyda Jester M, PA-C  clopidogrel (PLAVIX) 75 MG tablet Take 1 tablet (75 mg total) by mouth daily. 10/10/17  Yes Rosita Fire, Brittainy M, PA-C  insulin aspart (NOVOLOG) 100 UNIT/ML FlexPen Inject 0-10 Units into the skin 3 (three) times daily before meals. Sliding Scale: 0-150: 0u; 151-200: 2u; 201-250: 4u; 251-300: 6u; 301-350:  8u; 351-400:10u. 401+ Give 10u and call MD 10/03/17  Yes [name], MD  Insulin Pen Needle (PEN NEEDLES) 32G X 4 MM MISC 1 Dose by Does not apply route 3 (three) times daily. Use with sliding scale novolog with meals. 10/03/17  Yes [name], MD  Lancets [facility] DELICA PLUS [id number]) MISC USE ONE LANCET TO TEST FOUR TIMES A DAY 02/04/18  Yes [name], MD  Na Sulfate-K Sulfate-Mg Sulf 17.5-3.13-1.6 GM/177ML SOLN Take as directed for colonoscopy prep. 01/14/18  Yes Levin Erp, PA  nitroGLYCERIN (NITROSTAT) 0.4 MG SL tablet Place 1 tablet (0.4 mg total) under the tongue every 5 (five) minutes x 3 doses as needed for chest pain. 12/26/15  Yes Cheryln Manly, NP  ondansetron (ZOFRAN ODT) 4 MG disintegrating tablet Take 1 tablet (4 mg total) by mouth every 8 (eight) hours as needed for nausea or vomiting. 01/26/18  Yes Horton, Barbette Hair, MD  sevelamer carbonate (RENVELA) 800 MG tablet Take 1 tablet (800 mg total) by mouth 3 (three) times daily with meals. 08/15/17  Yes [name], MD  STOOL SOFTENER 100 MG capsule TAKE ONE CAPSULE BY MOUTH TWO TIMES A DAY 02/04/18  Yes [name], MD  traZODone (DESYREL) 50 MG tablet TAKE ONE TABLET BY MOUTH AT BEDTIME 02/07/18  Yes [name], MD   Social History   Socioeconomic History  . Marital status: Married    Spouse name: Not on file  . Number of children: Not on file  . Years of education: Not on file  . Highest education level: Not on file  Occupational History  . Occupation: International aid/development worker: [employer]  Social Needs  . Financial resource strain: Not on file  . Food insecurity:    Worry: Not on file    Inability: Not on file  . Transportation needs:    Medical: Not on file    Non-medical: Not on file  Tobacco Use  . Smoking status: Former Smoker    Packs/day: 0.75    Years: 29.00    Pack years: 21.75    Types: Cigarettes    Last attempt to quit: 05/28/2017    Years since  quitting: 0.7  . Smokeless tobacco: Never Used  Substance and Sexual Activity  . Alcohol use: Not Currently    Alcohol/week: 7.0 standard drinks    Types: 7 Cans of beer per week    Frequency: Never    Comment: last drink was prior to last hospitalization  . Drug use: No  . Sexual activity: Not Currently  Lifestyle  . Physical activity:    Days per week: Not on file    Minutes per session: Not on file  . Stress: Not on file  Relationships  . Social connections:    Talks on phone: Not on file    Gets together: Not on file    Attends religious service: Not on file    Active member of club or organization: Not on file    Attends meetings of clubs or organizations: Not on file    Relationship status: Not on file  . Intimate partner violence:    Fear of current or ex partner: Not on file    Emotionally abused: Not on file    Physically abused: Not on file    Forced sexual activity: Not on file  Other Topics Concern  . Not on file  Social History Narrative  . Not on file    Review of Systems Per HPI.     Objective:   Physical Exam Constitutional:      General: He is not in acute distress.    Appearance: He is well-developed.  HENT:     Head: Normocephalic and atraumatic.  Cardiovascular:     Rate and Rhythm: Normal rate.  Pulmonary:     Effort: Pulmonary effort is normal.  Abdominal:     General: Abdomen is flat. There is no distension.     Palpations: Abdomen is soft.     Tenderness: There is no abdominal tenderness. There is no right CVA tenderness or left CVA tenderness.  Neurological:     Mental Status: He is alert and oriented to person, place, and time.    Vitals:   03/11/18 1102  BP: 120/68  Pulse: 85  Resp: 17  Temp: 99.2 F (37.3 C)  TempSrc: Oral  SpO2: 93%  Weight: 128 lb 12.8 oz (58.4 kg)  Height: 5\' 4"  (1.626 m)   Results for orders placed or performed in visit on 03/11/18  POCT urinalysis dipstick  Result  Value Ref Range   Color, UA  yellow yellow   Clarity, UA cloudy (A) clear   Glucose, UA =100 (A) negative mg/dL   Bilirubin, UA negative negative   Ketones, POC UA negative negative mg/dL   Spec Grav, UA 1.020 1.010 - 1.025   Blood, UA moderate (A) negative   pH, UA 7.0 5.0 - 8.0   Protein Ur, POC >=300 (A) negative mg/dL   Urobilinogen, UA 0.2 0.2 or 1.0 E.U./dL   Nitrite, UA Negative Negative   Leukocytes, UA Large (3+) (A) Negative  POCT Microscopic Urinalysis ([facility])  Result Value Ref Range   WBC,UR,HPF,POC Too numerous to count  (A) None WBC/hpf   RBC,UR,HPF,POC Few (A) None RBC/hpf   Bacteria Moderate (A) None, Too numerous to count   Mucus Absent Absent   Epithelial Cells, UR Per Microscopy None None, Too numerous to count cells/hpf       Assessment & Plan:   Jeffery Young is a 52 y.o. male Dysuria - Plan: POCT urinalysis dipstick, POCT Microscopic Urinalysis ([facility])  Type 2 diabetes mellitus with chronic kidney disease on chronic dialysis, with long-term current use of insulin (HCC)  Unable to provide urine specimen in office.  Minimal symptoms with reported episodic dysuria or feeling of urgency.  Reported gross hematuria once recently. I did ask that he discuss symptoms with provider/nephrologist as in dialysis few times per week. Treated for presumptive UTI with Bactrim in December after hematuria (testing unable to be performed at that time).   -Did return with urinalysis as above after visit, but unfortunately urine culture was not obtained.  - called and left message to determine current symptoms and will likely need urine cx to determine next step.    03/18/18 Spoke with ex-wife/patient on phone.  Ultimately able to collect urine at home and plans to return to office in am to test/culture.  Still having some burning with urination at times, urgency to urinate at times. Has been overall the same since last week.  Notices some blood in incontinence pad with urination.  No abd pain, back pain or  fevers.  Tolerated septra DS BID when prescribed 02/01/18.  Will start Septra - renal dosing  (septra DS 1 QD, take after HD on HD days) for hematuria/urgency and possible recurrent UTI.  Follow culture.  If any abdominal pain, fevers, or any worsening symptoms on treatment, be seen right away. If any return of hematuria, urgency/frequency after current treatment, would recommend evaluation with urology.  RTC precautions discussed. Understanding expressed.   No orders of the defined types were placed in this encounter.  Patient Instructions    For right now I would say avoid using insulin we can continue to monitor your blood sugars at home either fasting or 2 hours after meal and review those readings in the next 6 weeks to see if other medicines are needed.  Make sure to pay specific attention to your diet as that is likely the main reason the sugars go over 200.   Please bring urine back for testing later today then can determine further work-up from there.  If you have any fever, abdominal pain, or acute worsening symptoms return here or emergency room.  I will review the FL 2 paperwork within the next week and will contact you further.    Diabetes Mellitus and Nutrition, Adult When you have diabetes (diabetes mellitus), it is very important to have healthy eating habits because your blood sugar (glucose) levels are greatly affected by  what you eat and drink. Eating healthy foods in the appropriate amounts, at about the same times every day, can help you:  Control your blood glucose.  Lower your risk of heart disease.  Improve your blood pressure.  Reach or maintain a healthy weight. Every person with diabetes is different, and each person has different needs for a meal plan. Your health care provider may recommend that you work with a diet and nutrition specialist (dietitian) to make a meal plan that is best for you. Your meal plan may vary depending on factors such as:  The  calories you need.  The medicines you take.  Your weight.  Your blood glucose, blood pressure, and cholesterol levels.  Your activity level.  Other health conditions you have, such as heart or kidney disease. How do carbohydrates affect me? Carbohydrates, also called carbs, affect your blood glucose level more than any other type of food. Eating carbs naturally raises the amount of glucose in your blood. Carb counting is a method for keeping track of how many carbs you eat. Counting carbs is important to keep your blood glucose at a healthy level, especially if you use insulin or take certain oral diabetes medicines. It is important to know how many carbs you can safely have in each meal. This is different for every person. Your dietitian can help you calculate how many carbs you should have at each meal and for each snack. Foods that contain carbs include:  Bread, cereal, rice, pasta, and crackers.  Potatoes and corn.  Peas, beans, and lentils.  Milk and yogurt.  Fruit and juice.  Desserts, such as cakes, cookies, ice cream, and candy. How does alcohol affect me? Alcohol can cause a sudden decrease in blood glucose (hypoglycemia), especially if you use insulin or take certain oral diabetes medicines. Hypoglycemia can be a life-threatening condition. Symptoms of hypoglycemia (sleepiness, dizziness, and confusion) are similar to symptoms of having too much alcohol. If your health care provider says that alcohol is safe for you, follow these guidelines:  Limit alcohol intake to no more than 1 drink per day for nonpregnant women and 2 drinks per day for men. One drink equals 12 oz of beer, 5 oz of wine, or 1 oz of hard liquor.  Do not drink on an empty stomach.  Keep yourself hydrated with water, diet soda, or unsweetened iced tea.  Keep in mind that regular soda, juice, and other mixers may contain a lot of sugar and must be counted as carbs. What are tips for following this  plan?  Reading food labels  Start by checking the serving size on the "Nutrition Facts" label of packaged foods and drinks. The amount of calories, carbs, fats, and other nutrients listed on the label is based on one serving of the item. Many items contain more than one serving per package.  Check the total grams (g) of carbs in one serving. You can calculate the number of servings of carbs in one serving by dividing the total carbs by 15. For example, if a food has 30 g of total carbs, it would be equal to 2 servings of carbs.  Check the number of grams (g) of saturated and trans fats in one serving. Choose foods that have low or no amount of these fats.  Check the number of milligrams (mg) of salt (sodium) in one serving. Most people should limit total sodium intake to less than 2,300 mg per day.  Always check the nutrition information of  foods labeled as "low-fat" or "nonfat". These foods may be higher in added sugar or refined carbs and should be avoided.  Talk to your dietitian to identify your daily goals for nutrients listed on the label. Shopping  Avoid buying canned, premade, or processed foods. These foods tend to be high in fat, sodium, and added sugar.  Shop around the outside edge of the grocery store. This includes fresh fruits and vegetables, bulk grains, fresh meats, and fresh dairy. Cooking  Use low-heat cooking methods, such as baking, instead of high-heat cooking methods like deep frying.  Cook using healthy oils, such as olive, canola, or sunflower oil.  Avoid cooking with butter, cream, or high-fat meats. Meal planning  Eat meals and snacks regularly, preferably at the same times every day. Avoid going long periods of time without eating.  Eat foods high in fiber, such as fresh fruits, vegetables, beans, and whole grains. Talk to your dietitian about how many servings of carbs you can eat at each meal.  Eat 4-6 ounces (oz) of lean protein each day, such as lean  meat, chicken, fish, eggs, or tofu. One oz of lean protein is equal to: ? 1 oz of meat, chicken, or fish. ? 1 egg. ?  cup of tofu.  Eat some foods each day that contain healthy fats, such as avocado, nuts, seeds, and fish. Lifestyle  Check your blood glucose regularly.  Exercise regularly as told by your health care provider. This may include: ? 150 minutes of moderate-intensity or vigorous-intensity exercise each week. This could be brisk walking, biking, or water aerobics. ? Stretching and doing strength exercises, such as yoga or weightlifting, at least 2 times a week.  Take medicines as told by your health care provider.  Do not use any products that contain nicotine or tobacco, such as cigarettes and e-cigarettes. If you need help quitting, ask your health care provider.  Work with a Social worker or diabetes educator to identify strategies to manage stress and any emotional and social challenges. Questions to ask a health care provider  Do I need to meet with a diabetes educator?  Do I need to meet with a dietitian?  What number can I call if I have questions?  When are the best times to check my blood glucose? Where to find more information:  American Diabetes Association: diabetes.org  Academy of Nutrition and Dietetics: www.eatright.[employer] of Diabetes and Digestive and Kidney Diseases (NIH): [employer] Summary  A healthy meal plan will help you control your blood glucose and maintain a healthy lifestyle.  Working with a diet and nutrition specialist (dietitian) can help you make a meal plan that is best for you.  Keep in mind that carbohydrates (carbs) and alcohol have immediate effects on your blood glucose levels. It is important to count carbs and to use alcohol carefully. This information is not intended to replace advice given to you by your health care provider. Make sure you discuss any questions you have with your health care  provider. Document Released: 10/19/2004 Document Revised: 08/22/2016 Document Reviewed: 02/27/2016 Elsevier Interactive Patient Education  [employer].   If you have lab work done today you will be contacted with your lab results within the next 2 weeks.  If you have not heard from [place] then please contact us. The fastest way to get your results is to register for My Chart.   IF you received an x-ray today, you will receive an invoice from [facility]. Please  contact [facility] at [phone] with questions or concerns regarding your invoice.   IF you received labwork today, you will receive an invoice from [place] [facility]. Please contact [facility] at [phone] with questions or concerns regarding your invoice.   Our billing staff will not be able to assist you with questions regarding bills from these companies.  You will be contacted with the lab results as soon as they are available. The fastest way to get your results is to activate your My Chart account. Instructions are located on the last page of this paperwork. If you have not heard from [place] regarding the results in 2 weeks, please contact this office.      Signed,   Merri Ray, MD [facility] [place].  03/15/18 6:53 PM

## 2018-03-11 NOTE — Patient Instructions (Addendum)
For right now I would say avoid using insulin we can continue to monitor your blood sugars at home either fasting or 2 hours after meal and review those readings in the next 6 weeks to see if other medicines are needed.  Make sure to pay specific attention to your diet as that is likely the main reason the sugars go over 200.   Please bring urine back for testing later today then can determine further work-up from there.  If you have any fever, abdominal pain, or acute worsening symptoms return here or emergency room.  I will review the FL 2 paperwork within the next week and will contact you further.    Diabetes Mellitus and Nutrition, Adult When you have diabetes (diabetes mellitus), it is very important to have healthy eating habits because your blood sugar (glucose) levels are greatly affected by what you eat and drink. Eating healthy foods in the appropriate amounts, at about the same times every day, can help you:  Control your blood glucose.  Lower your risk of heart disease.  Improve your blood pressure.  Reach or maintain a healthy weight. Every person with diabetes is different, and each person has different needs for a meal plan. Your health care provider may recommend that you work with a diet and nutrition specialist (dietitian) to make a meal plan that is best for you. Your meal plan may vary depending on factors such as:  The calories you need.  The medicines you take.  Your weight.  Your blood glucose, blood pressure, and cholesterol levels.  Your activity level.  Other health conditions you have, such as heart or kidney disease. How do carbohydrates affect me? Carbohydrates, also called carbs, affect your blood glucose level more than any other type of food. Eating carbs naturally raises the amount of glucose in your blood. Carb counting is a method for keeping track of how many carbs you eat. Counting carbs is important to keep your blood glucose at a healthy level,  especially if you use insulin or take certain oral diabetes medicines. It is important to know how many carbs you can safely have in each meal. This is different for every person. Your dietitian can help you calculate how many carbs you should have at each meal and for each snack. Foods that contain carbs include:  Bread, cereal, rice, pasta, and crackers.  Potatoes and corn.  Peas, beans, and lentils.  Milk and yogurt.  Fruit and juice.  Desserts, such as cakes, cookies, ice cream, and candy. How does alcohol affect me? Alcohol can cause a sudden decrease in blood glucose (hypoglycemia), especially if you use insulin or take certain oral diabetes medicines. Hypoglycemia can be a life-threatening condition. Symptoms of hypoglycemia (sleepiness, dizziness, and confusion) are similar to symptoms of having too much alcohol. If your health care provider says that alcohol is safe for you, follow these guidelines:  Limit alcohol intake to no more than 1 drink per day for nonpregnant women and 2 drinks per day for men. One drink equals 12 oz of beer, 5 oz of wine, or 1 oz of hard liquor.  Do not drink on an empty stomach.  Keep yourself hydrated with water, diet soda, or unsweetened iced tea.  Keep in mind that regular soda, juice, and other mixers may contain a lot of sugar and must be counted as carbs. What are tips for following this plan?  Reading food labels  Start by checking the serving size on the "Nutrition  Facts" label of packaged foods and drinks. The amount of calories, carbs, fats, and other nutrients listed on the label is based on one serving of the item. Many items contain more than one serving per package.  Check the total grams (g) of carbs in one serving. You can calculate the number of servings of carbs in one serving by dividing the total carbs by 15. For example, if a food has 30 g of total carbs, it would be equal to 2 servings of carbs.  Check the number of grams  (g) of saturated and trans fats in one serving. Choose foods that have low or no amount of these fats.  Check the number of milligrams (mg) of salt (sodium) in one serving. Most people should limit total sodium intake to less than 2,300 mg per day.  Always check the nutrition information of foods labeled as "low-fat" or "nonfat". These foods may be higher in added sugar or refined carbs and should be avoided.  Talk to your dietitian to identify your daily goals for nutrients listed on the label. Shopping  Avoid buying canned, premade, or processed foods. These foods tend to be high in fat, sodium, and added sugar.  Shop around the outside edge of the grocery store. This includes fresh fruits and vegetables, bulk grains, fresh meats, and fresh dairy. Cooking  Use low-heat cooking methods, such as baking, instead of high-heat cooking methods like deep frying.  Cook using healthy oils, such as olive, canola, or sunflower oil.  Avoid cooking with butter, cream, or high-fat meats. Meal planning  Eat meals and snacks regularly, preferably at the same times every day. Avoid going long periods of time without eating.  Eat foods high in fiber, such as fresh fruits, vegetables, beans, and whole grains. Talk to your dietitian about how many servings of carbs you can eat at each meal.  Eat 4-6 ounces (oz) of lean protein each day, such as lean meat, chicken, fish, eggs, or tofu. One oz of lean protein is equal to: ? 1 oz of meat, chicken, or fish. ? 1 egg. ?  cup of tofu.  Eat some foods each day that contain healthy fats, such as avocado, nuts, seeds, and fish. Lifestyle  Check your blood glucose regularly.  Exercise regularly as told by your health care provider. This may include: ? 150 minutes of moderate-intensity or vigorous-intensity exercise each week. This could be brisk walking, biking, or water aerobics. ? Stretching and doing strength exercises, such as yoga or weightlifting, at  least 2 times a week.  Take medicines as told by your health care provider.  Do not use any products that contain nicotine or tobacco, such as cigarettes and e-cigarettes. If you need help quitting, ask your health care provider.  Work with a Social worker or diabetes educator to identify strategies to manage stress and any emotional and social challenges. Questions to ask a health care provider  Do I need to meet with a diabetes educator?  Do I need to meet with a dietitian?  What number can I call if I have questions?  When are the best times to check my blood glucose? Where to find more information:  American Diabetes Association: diabetes.org  Academy of Nutrition and Dietetics: www.eatright.[employer] of Diabetes and Digestive and Kidney Diseases (NIH): [employer] Summary  A healthy meal plan will help you control your blood glucose and maintain a healthy lifestyle.  Working with a diet and nutrition specialist (dietitian) can help you  make a meal plan that is best for you.  Keep in mind that carbohydrates (carbs) and alcohol have immediate effects on your blood glucose levels. It is important to count carbs and to use alcohol carefully. This information is not intended to replace advice given to you by your health care provider. Make sure you discuss any questions you have with your health care provider. Document Released: 10/19/2004 Document Revised: 08/22/2016 Document Reviewed: 02/27/2016 Elsevier Interactive Patient Education  [employer].   If you have lab work done today you will be contacted with your lab results within the next 2 weeks.  If you have not heard from [place] then please contact us. The fastest way to get your results is to register for My Chart.   IF you received an x-ray today, you will receive an invoice from [facility]. Please contact [facility] at [phone] with questions or concerns regarding your invoice.    IF you received labwork today, you will receive an invoice from [place]. Please contact [facility] at [phone] with questions or concerns regarding your invoice.   Our billing staff will not be able to assist you with questions regarding bills from these companies.  You will be contacted with the lab results as soon as they are available. The fastest way to get your results is to activate your My Chart account. Instructions are located on the last page of this paperwork. If you have not heard from [place] regarding the results in 2 weeks, please contact this office.

## 2018-03-13 ENCOUNTER — Ambulatory Visit: Payer: BC Managed Care – PPO | Admitting: Family Medicine

## 2018-03-15 ENCOUNTER — Other Ambulatory Visit: Payer: Self-pay | Admitting: Family Medicine

## 2018-03-15 DIAGNOSIS — Z992 Dependence on renal dialysis: Principal | ICD-10-CM

## 2018-03-15 DIAGNOSIS — N186 End stage renal disease: Secondary | ICD-10-CM

## 2018-03-17 ENCOUNTER — Telehealth: Payer: Self-pay | Admitting: Family Medicine

## 2018-03-17 NOTE — Telephone Encounter (Signed)
Copied from [place] [id number]. Topic: Quick Communication - See Telephone Encounter >> Mar 17, 2018  4:36 PM Vernona Rieger wrote: CRM for notification. See Telephone encounter for: 03/17/18.  Patient's daughter called and would like his results from 2/4. [phone]

## 2018-03-17 NOTE — Telephone Encounter (Signed)
Requested medication (s) are due for refill today: yes  Requested medication (s) are on the active medication list: yes  Last refill:  01/04/18  Future visit scheduled: yes  Notes to clinic:  Medication not delegated for NT to refill   Requested Prescriptions  Pending Prescriptions Disp Refills   calcitRIOL (ROCALTROL) 0.5 MCG capsule [Pharmacy Med Name: CALCITRIOL 0.5 MCG CAPSULE] 30 capsule 0    Sig: TAKE 1 CAPSULE BY MOUTH ON MONDAY, [place], [place]     Endocrinology:  Vitamins - Vitamin D Supplementation Failed - 03/15/2018  6:44 AM      Failed - 50,000 IU strengths are not delegated      Failed - Ca in normal range and within 360 days    Calcium  Date Value Ref Range Status  01/26/2018 8.3 (L) 8.9 - 10.3 mg/dL Final   Calcium, Total (PTH)  Date Value Ref Range Status  05/31/2017 8.3 (L) 8.7 - 10.2 mg/dL Final         Failed - Vitamin D in normal range and within 360 days    No results found for: [id number], [id number], [id number], 25OHVITD3, 25OHVITD2, 25OHVITD3, 25OHVITD2, 25OHVITD1, 25OHVITD2, 25OHVITD3, VD25OH       Passed - Phosphate in normal range and within 360 days    Phosphorus  Date Value Ref Range Status  07/04/2017 4.5 2.5 - 4.6 mg/dL Final         Passed - Valid encounter within last 12 months    Recent Outpatient Visits          6 days ago Dysuria   [facility], Ranell Patrick, MD   1 week ago History of CVA with residual deficit   [facility], Ranell Patrick, MD   1 month ago Acute pain of left shoulder   [facility], Ranell Patrick, MD   3 months ago Colon cancer screening   [facility], Ranell Patrick, MD   4 months ago Type 2 diabetes mellitus with other neurologic complication, with long-term current use of insulin [facility])   [facility], Ranell Patrick, MD      Future Appointments            In 1 month Carlota Raspberry Ranell Patrick, MD [facility] [place], [facility]

## 2018-03-18 ENCOUNTER — Ambulatory Visit (INDEPENDENT_AMBULATORY_CARE_PROVIDER_SITE_OTHER): Payer: BC Managed Care – PPO | Admitting: Family Medicine

## 2018-03-18 DIAGNOSIS — R300 Dysuria: Secondary | ICD-10-CM

## 2018-03-18 NOTE — Addendum Note (Signed)
Addended by: Dierdre Searles on: 03/18/2018 04:11 PM   Modules accepted: Orders

## 2018-03-18 NOTE — Telephone Encounter (Signed)
Spoke with pt and his daughter advised me that he is still having symptoms of a urinary infection such as odor,blood in urine, burning, and Frequency.  Pt daughter states he has no fever or abdominal pain at this time. Dr. Darrow Bussing with pt and daughter and advised them to come into the office and leave a sample so we could get a culture to see if antibiotic is needed. She verbalized understanding and states she will try to get pt into the office for a sample.

## 2018-03-18 NOTE — Telephone Encounter (Signed)
Lvm for daughter to call office back.

## 2018-03-18 NOTE — Patient Instructions (Signed)
Pt present in the office today for Urine sample to recheck for culture

## 2018-03-19 ENCOUNTER — Encounter: Payer: Self-pay | Admitting: Family Medicine

## 2018-03-19 ENCOUNTER — Other Ambulatory Visit: Payer: Self-pay

## 2018-03-19 DIAGNOSIS — R300 Dysuria: Secondary | ICD-10-CM

## 2018-03-19 LAB — POC URINALSYSI DIPSTICK (AUTOMATED)
Bilirubin, UA: NEGATIVE
Glucose, UA: NEGATIVE
KETONES UA: NEGATIVE
Nitrite, UA: NEGATIVE
Protein, UA: POSITIVE — AB
Spec Grav, UA: 1.02 (ref 1.010–1.025)
Urobilinogen, UA: 0.2 E.U./dL
pH, UA: 7 (ref 5.0–8.0)

## 2018-03-19 LAB — POC MICROSCOPIC URINALYSIS (UMFC): Mucus: ABSENT

## 2018-03-19 MED ORDER — SULFAMETHOXAZOLE-TRIMETHOPRIM 800-160 MG PO TABS: 1.0000 | ORAL_TABLET | Freq: Every day | ORAL | 0 refills | Status: DC

## 2018-03-19 NOTE — Progress Notes (Signed)
3.6 °

## 2018-03-19 NOTE — Addendum Note (Signed)
Addended by: Gari Crown D on: 03/19/2018 09:34 AM   Modules accepted: Orders

## 2018-03-22 LAB — URINE CULTURE

## 2018-03-24 ENCOUNTER — Telehealth: Payer: Self-pay | Admitting: Family Medicine

## 2018-03-24 NOTE — Telephone Encounter (Signed)
Copied from [place] [phone] [id number]. Topic: Quick Communication - See Telephone Encounter >> Mar 24, 2018 12:26 PM Blase Mess A wrote: CRM for notification. See Telephone encounter for: 03/24/18.  Patients wife Vamsi Apfel is calling regarding FL-2 completion Please advise [phone]

## 2018-03-25 NOTE — Telephone Encounter (Signed)
Please advise on FL2 form. Have you already completed it?

## 2018-03-26 NOTE — Telephone Encounter (Signed)
Spoke with [employer]. Discussed urine cx and plan (sent by Mychart prior).   Not on insulin and home readings 110-140.   Due to other obligations at this time, she will need to discuss FL2 ppwk later - plan to do so tomorrow over phone.

## 2018-03-26 NOTE — Telephone Encounter (Signed)
Pt wife calling about the [facility] form stating that she really need these forms for her husband its been sitting to long at the office and she states that know one ever calls her when paperwork is ready please call Marval Regal [phone]

## 2018-03-26 NOTE — Telephone Encounter (Signed)
Dr Carlota Raspberry Not sure if the [facility] for has been filled and is ready for the patient to pick. Just checking back on this since patient called again in referrrence to the [facility] form

## 2018-03-28 ENCOUNTER — Other Ambulatory Visit: Payer: Self-pay | Admitting: Family Medicine

## 2018-03-28 NOTE — Telephone Encounter (Signed)
Form completed.  Can be picked up or faxed (med list should accompany FL2 form).

## 2018-03-31 NOTE — Telephone Encounter (Signed)
Requested Prescriptions  Pending Prescriptions Disp Refills  . STOOL SOFTENER 100 MG capsule [Pharmacy Med Name: STOOL SOFTENER 100 MG SOFTGEL] 60 capsule 0    Sig: TAKE ONE CAPSULE BY MOUTH TWICE A DAY     Over the Counter:  OTC Passed - 03/28/2018  9:27 PM      Passed - Valid encounter within last 12 months    Recent Outpatient Visits          1 week ago Dysuria   [facility], Ranell Patrick, MD   2 weeks ago Dysuria   [facility], Ranell Patrick, MD   3 weeks ago History of CVA with residual deficit   [facility], Ranell Patrick, MD   1 month ago Acute pain of left shoulder   [facility], Ranell Patrick, MD   3 months ago Colon cancer screening   [facility], Ranell Patrick, MD      Future Appointments            In 3 weeks Carlota Raspberry Ranell Patrick, MD [facility] at [place], [facility]

## 2018-03-31 NOTE — Telephone Encounter (Signed)
Per Brenda,Clerical form and medication list picked up.

## 2018-03-31 NOTE — Telephone Encounter (Signed)
spoke to patients wife she was informed that husband forms are ready for pick up. She stated she will be sending daughter to pick up forms today.

## 2018-04-07 ENCOUNTER — Telehealth: Payer: Self-pay | Admitting: Gastroenterology

## 2018-04-07 NOTE — Telephone Encounter (Signed)
Beth can you help touch base with this patient.  He wanted to have a screening colonoscopy but would require admission to the hospital for assistance with prep and for HD given ESRD. He will need approval to hold Plavix by his Neurologist given his stoke within the past year. If he has approval to hold that and wishes to proceed, will need to look at the calendar and see when this can be coordinated. Thanks

## 2018-04-08 NOTE — Telephone Encounter (Signed)
Left message to call to discuss.

## 2018-04-08 NOTE — Telephone Encounter (Signed)
Beth I have never met this patient. He was offered other stool based testing in clinic with [facility] and strongly declined, preferring to have a colonoscopy. I will offer him colonoscopy if he strongly wishes to proceed. If he is not certain about this would recommend he see me in clinic to discuss options again as he is high risk patient. I would strongly consider stool testing such as FIT first in his case. If he wishes to do that we can have him submit that first. Let me know what he wishes to do. If he wants to have the colonoscopy will look at the calendar to coordinate a date. Thanks much

## 2018-04-08 NOTE — Telephone Encounter (Signed)
I read through his notes. Dr Leonie Man had given consent to hold the Plavix in December for a February procedure. I will ask if this is still the case if needed. My question is do you have a date in mind? It appears this will be a complicated scheduling due to his dialysis on M,W,F every week. He said "I guess" when asked if he wants to do this.

## 2018-04-24 ENCOUNTER — Other Ambulatory Visit: Payer: Self-pay | Admitting: Family Medicine

## 2018-04-24 ENCOUNTER — Ambulatory Visit: Payer: BC Managed Care – PPO | Admitting: Family Medicine

## 2018-04-24 ENCOUNTER — Other Ambulatory Visit: Payer: Self-pay

## 2018-04-24 VITALS — BP 118/62 | HR 72 | Temp 98.3°F | Resp 14 | Wt 130.0 lb

## 2018-04-24 DIAGNOSIS — E1121 Type 2 diabetes mellitus with diabetic nephropathy: Secondary | ICD-10-CM | POA: Diagnosis not present

## 2018-04-24 DIAGNOSIS — F32A Depression, unspecified: Secondary | ICD-10-CM

## 2018-04-24 DIAGNOSIS — Z23 Encounter for immunization: Secondary | ICD-10-CM | POA: Diagnosis not present

## 2018-04-24 DIAGNOSIS — I2510 Atherosclerotic heart disease of native coronary artery without angina pectoris: Secondary | ICD-10-CM | POA: Diagnosis not present

## 2018-04-24 DIAGNOSIS — Z1211 Encounter for screening for malignant neoplasm of colon: Secondary | ICD-10-CM

## 2018-04-24 DIAGNOSIS — F329 Major depressive disorder, single episode, unspecified: Secondary | ICD-10-CM

## 2018-04-24 DIAGNOSIS — Z8673 Personal history of transient ischemic attack (TIA), and cerebral infarction without residual deficits: Secondary | ICD-10-CM

## 2018-04-24 DIAGNOSIS — I10 Essential (primary) hypertension: Secondary | ICD-10-CM

## 2018-04-24 DIAGNOSIS — E7800 Pure hypercholesterolemia, unspecified: Secondary | ICD-10-CM

## 2018-04-24 MED ORDER — ONETOUCH DELICA PLUS LANCET33G MISC: 1.0000 "application " | Freq: Two times a day (BID) | 2 refills | Status: DC | PRN

## 2018-04-24 MED ORDER — ATORVASTATIN CALCIUM 10 MG PO TABS: 10.0000 mg | ORAL_TABLET | Freq: Every day | ORAL | 2 refills | Status: DC

## 2018-04-24 MED ORDER — TRAZODONE HCL 50 MG PO TABS: 50.0000 mg | ORAL_TABLET | Freq: Every day | ORAL | 1 refills | Status: DC

## 2018-04-24 MED ORDER — CLOPIDOGREL BISULFATE 75 MG PO TABS: 75.0000 mg | ORAL_TABLET | Freq: Every day | ORAL | 2 refills | Status: DC

## 2018-04-24 MED ORDER — CARVEDILOL 6.25 MG PO TABS: 6.2500 mg | ORAL_TABLET | Freq: Two times a day (BID) | ORAL | 2 refills | Status: DC

## 2018-04-24 MED ORDER — GLUCOSE BLOOD VI STRP: 1.0000 | ORAL_STRIP | 2 refills | Status: AC | PRN

## 2018-04-24 NOTE — Telephone Encounter (Signed)
Please add test strips to active med list

## 2018-04-24 NOTE — Patient Instructions (Addendum)
No change in medication for now.  Keep follow-up with cardiology as planned.  Avoid groups, make sure to wash hands thoroughly during this time and avoid those who are sick. We can check A1c in 2 -3 months, but let me know if blood sugars are running higher and that can be checked sooner.    If you have lab work done today you will be contacted with your lab results within the next 2 weeks.  If you have not heard from [place] then please contact us. The fastest way to get your results is to register for My Chart.   IF you received an x-ray today, you will receive an invoice from [facility]. Please contact [facility] at [phone] with questions or concerns regarding your invoice.   IF you received labwork today, you will receive an invoice from [facility]. Please contact [facility] at [phone] with questions or concerns regarding your invoice.   Our billing staff will not be able to assist you with questions regarding bills from these companies.  You will be contacted with the lab results as soon as they are available. The fastest way to get your results is to activate your My Chart account. Instructions are located on the last page of this paperwork. If you have not heard from [place] regarding the results in 2 weeks, please contact this office.

## 2018-04-24 NOTE — Progress Notes (Signed)
Subjective:    Patient ID: Jeffery Young, male    DOB: 1965-12-24, 53 y.o.   MRN: [id number]  HPI Jeffery Young is a 53 y.o. male Presents today for: Chief Complaint  Patient presents with  . Diabetes    6 week f/u on diabetes and need refills on medication   Diabetes:  Home readings under 150, no afternoon readings known. Has not used insulin in past month or so. Diet controlled only.  Complicated by CKD on HD 3 days per week.  Lab Results  Component Value Date   HGBA1C 6.7 (H) 03/06/2018   HGBA1C 6.8 (H) 09/05/2017   HGBA1C 6.9 (H) 05/29/2017   Lab Results  Component Value Date   LDLCALC 59 10/03/2017   CREATININE 4.54 (H) 01/26/2018   Hematuria See prior visits. No return of any abd pain, back pain, blood in urine. Asymptomatic now. S/P renal dosing of abx for Klebsiella infection on cx 03/19/18.   Appt with cardiology next week.   Living status - still staying at home with family. Here with dtr. Looking into [facility] care - FL2 completed prior.   HM: Pneumovax - Had at dialysis clinic in October.   Hyperlipidemia: lipitor 10mg  qd.  Not fasting this am Lab Results  Component Value Date   CHOL 146 10/03/2017   HDL 42 10/03/2017   LDLCALC 59 10/03/2017   TRIG 223 (H) 10/03/2017   CHOLHDL 3.5 10/03/2017   Lab Results  Component Value Date   ALT 19 01/26/2018   AST 23 01/26/2018   ALKPHOS 63 01/26/2018   BILITOT 0.5 01/26/2018   Insomnia  controlled with trazodone.  Depression screen [facility] 2/9 04/24/2018 03/11/2018 03/06/2018 02/13/2018 12/10/2017  Decreased Interest 0 0 0 0 0  Down, Depressed, Hopeless 0 0 0 0 3  PHQ - 2 Score 0 0 0 0 3  Altered sleeping - - - - 0  Tired, decreased energy - - - - 0  Change in appetite - - - - 0  Feeling bad or failure about yourself  - - - - 1  Trouble concentrating - - - - 0  Moving slowly or fidgety/restless - - - - 0  Suicidal thoughts - - - - 0  PHQ-9 Score - - - - 4  Difficult doing work/chores - - - - Not  difficult at all       Patient Active Problem List   Diagnosis Date Noted  . COPD GOLD III  10/24/2017  . CAD (coronary artery disease), native coronary artery 10/10/2017  . ESRD (end stage renal disease) on dialysis ([place]) 06/25/2017  . Uncontrolled hypertension 05/28/2017  . Depression 05/28/2017  . Normocytic anemia 05/28/2017  . History of completed stroke 05/28/2017  . Syncope 03/13/2016  . Ischemic cardiomyopathy 03/13/2016  . Unstable angina pectoris ([place]) 01/11/2016  . Hypertensive heart disease with heart failure ([place])   . Chronic combined systolic and diastolic heart failure ([place])   . Elevated troponin   . Chest pain 01/10/2016  . Coronary artery disease due to lipid rich plaque 01/10/2016  . Status post coronary artery stent placement   . Type 2 diabetes mellitus with diabetic nephropathy, without long-term current use of insulin ([place])   . Tobacco abuse   . Essential hypertension   . Hypercholesteremia   . NSTEMI (non-ST elevated myocardial infarction) ([place]) 12/22/2015  . Lightheadedness 08/22/2012  . Dehydration 08/22/2012   Past Medical History:  Diagnosis Date  . Chest pain 01/10/2016  . Chronic  combined systolic and diastolic heart failure ([place])   . Coronary artery disease    a.12/22/15: NSTEMI s/p overlapping DES x2 and balloon angioplasty to distal AV groove Circumflex (too small for a stent).   . Coronary artery disease due to lipid rich plaque 01/10/2016  . Dehydration 08/22/2012  . Depression   . Elevated troponin   . ESRD (end stage renal disease) on dialysis ([place])   . Essential hypertension   . History of completed stroke 05/28/2017  . Hypercholesteremia   . Hypertension   . Hypertensive heart disease with heart failure ([place])   . Ischemic cardiomyopathy 03/13/2016  . Normocytic anemia 05/28/2017  . NSTEMI (non-ST elevated myocardial infarction) ([place]) 12/22/2015  . Prostate infection   . Status post coronary artery stent placement   . Syncope 03/13/2016   . Tobacco abuse   . Type 2 diabetes mellitus with diabetic nephropathy, without long-term current use of insulin ([place])   . Type II diabetes mellitus ([place])   . Uncontrolled hypertension 05/28/2017  . Unstable angina pectoris ([place]) 01/11/2016   Past Surgical History:  Procedure Laterality Date  . BASCILIC VEIN TRANSPOSITION Left 06/28/2017   Procedure: LEFT UPPER ARM ARTERIOVENOUS GORTEX-GRAFT PLACEMENT;  Surgeon: Angelia Mould, MD;  Location: [place];  Service: Vascular;  Laterality: Left;  . CARDIAC CATHETERIZATION N/A 12/22/2015   Procedure: Left Heart Cath and Coronary Angiography;  Surgeon: Burnell Blanks, MD;  Location: [place] CV LAB;  Service: Cardiovascular;  Laterality: N/A;  . CARDIAC CATHETERIZATION N/A 12/22/2015   Procedure: Coronary Stent Intervention;  Surgeon: Burnell Blanks, MD;  Location: [place] [facility] LAB;  Service: Cardiovascular;  Laterality: N/A;  . CARDIAC CATHETERIZATION N/A 01/11/2016   Procedure: Left Heart Cath and Coronary Angiography;  Surgeon: Sherren Mocha, MD;  Location: [place] [facility];  Service: Cardiovascular;  Laterality: N/A;  . CARDIAC CATHETERIZATION N/A 01/11/2016   Procedure: Intravascular Pressure Wire/FFR Study;  Surgeon: Sherren Mocha, MD;  Location: [place] CV LAB;  Service: Cardiovascular;  Laterality: N/A;  . CARDIAC CATHETERIZATION N/A 01/11/2016   Procedure: Coronary Stent Intervention;  Surgeon: Sherren Mocha, MD;  Location: [place] CV LAB;  Service: Cardiovascular;  Laterality: N/A;  . CORONARY ANGIOPLASTY WITH STENT PLACEMENT  12/22/2015  . INSERTION OF DIALYSIS CATHETER Right 06/28/2017   Procedure: INSERTION OF DIALYSIS CATHETER RIGHT INTERNAL JUGULAR;  Surgeon: Angelia Mould, MD;  Location: [facility] OR;  Service: Vascular;  Laterality: Right;   No Known Allergies Prior to Admission medications   Medication Sig Start Date End Date Taking? Authorizing Provider  aspirin 81 MG chewable tablet CHEW 1  TABLET BY MOUTH DAILY 02/07/18  Yes [name], MD  atorvastatin (LIPITOR) 10 MG tablet Take 1 tablet (10 mg total) by mouth daily. 10/10/17  Yes Simmons, Brittainy M, PA-C  B Complex-C-Folic Acid (RENA-VITE RX) 1 MG TABS TAKE ONE TABLET BY MOUTH AT BEDTIME 02/13/18  Yes [name], MD  calcitRIOL (ROCALTROL) 0.5 MCG capsule TAKE 1 CAPSULE BY MOUTH ON MONDAY, WEDNESDAY, AND FRIDAY WITH HEMODIALYSIS 03/17/18  Yes [name], MD  carvedilol (COREG) 6.25 MG tablet Take 1 tablet (6.25 mg total) by mouth 2 (two) times daily with a meal. 10/10/17  Yes Lyda Jester M, PA-C  clopidogrel (PLAVIX) 75 MG tablet Take 1 tablet (75 mg total) by mouth daily. 10/10/17  Yes Rosita Fire, Brittainy M, PA-C  insulin aspart (NOVOLOG) 100 UNIT/ML FlexPen Inject 0-10 Units into the skin 3 (three) times daily before meals. Sliding Scale:  0-150: 0u; 151-200: 2u; 201-250: 4u; 251-300: 6u; 301-350: 8u; 351-400:10u. 401+ Give 10u and call MD 10/03/17  Yes [name], MD  Insulin Pen Needle (PEN NEEDLES) 32G X 4 MM MISC 1 Dose by Does not apply route 3 (three) times daily. Use with sliding scale novolog with meals. 10/03/17  Yes [name], MD  Lancets [facility] DELICA PLUS [id number]) MISC USE ONE LANCET TO TEST FOUR TIMES A DAY 02/04/18  Yes [name], MD  Na Sulfate-K Sulfate-Mg Sulf 17.5-3.13-1.6 GM/177ML SOLN Take as directed for colonoscopy prep. 01/14/18  Yes Levin Erp, PA  nitroGLYCERIN (NITROSTAT) 0.4 MG SL tablet Place 1 tablet (0.4 mg total) under the tongue every 5 (five) minutes x 3 doses as needed for chest pain. 12/26/15  Yes Cheryln Manly, NP  ondansetron (ZOFRAN ODT) 4 MG disintegrating tablet Take 1 tablet (4 mg total) by mouth every 8 (eight) hours as needed for nausea or vomiting. 01/26/18  Yes Horton, Barbette Hair, MD  sevelamer carbonate (RENVELA) 800 MG tablet Take 1 tablet (800 mg total) by mouth 3 (three) times daily with meals. 08/15/17  Yes [name],  MD  STOOL SOFTENER 100 MG capsule TAKE ONE CAPSULE BY MOUTH TWICE A DAY 03/31/18  Yes [name], MD  traZODone (DESYREL) 50 MG tablet TAKE ONE TABLET BY MOUTH AT BEDTIME 02/07/18  Yes [name], MD  [facility] VERIO test strip USE ONE STRIP TO TEST FOUR TIMES A DAY 04/24/18   [name], MD   Social History   Socioeconomic History  . Marital status: Married    Spouse name: Not on file  . Number of children: Not on file  . Years of education: Not on file  . Highest education level: Not on file  Occupational History  . Occupation: International aid/development worker: [employer]  Social Needs  . Financial resource strain: Not on file  . Food insecurity:    Worry: Not on file    Inability: Not on file  . Transportation needs:    Medical: Not on file    Non-medical: Not on file  Tobacco Use  . Smoking status: Former Smoker    Packs/day: 0.75    Years: 29.00    Pack years: 21.75    Types: Cigarettes    Last attempt to quit: 05/28/2017    Years since quitting: 0.9  . Smokeless tobacco: Never Used  Substance and Sexual Activity  . Alcohol use: Not Currently    Alcohol/week: 7.0 standard drinks    Types: 7 Cans of beer per week    Frequency: Never    Comment: last drink was prior to last hospitalization  . Drug use: No  . Sexual activity: Not Currently  Lifestyle  . Physical activity:    Days per week: Not on file    Minutes per session: Not on file  . Stress: Not on file  Relationships  . Social connections:    Talks on phone: Not on file    Gets together: Not on file    Attends religious service: Not on file    Active member of club or organization: Not on file    Attends meetings of clubs or organizations: Not on file    Relationship status: Not on file  . Intimate partner violence:    Fear of current or ex partner: Not on file    Emotionally abused: Not on file    Physically  abused: Not on file    Forced sexual activity: Not on file  Other Topics  Concern  . Not on file  Social History Narrative  . Not on file    Review of Systems  Constitutional: Negative for fatigue and unexpected weight change.  Eyes: Negative for visual disturbance.  Respiratory: Negative for cough, chest tightness and shortness of breath.   Cardiovascular: Negative for chest pain, palpitations and leg swelling.  Gastrointestinal: Negative for abdominal pain and blood in stool.  Genitourinary: Negative for difficulty urinating and hematuria.  Neurological: Negative for dizziness, light-headedness and headaches.   Patient Active Problem List   Diagnosis Date Noted  . COPD GOLD III  10/24/2017  . CAD (coronary artery disease), native coronary artery 10/10/2017  . ESRD (end stage renal disease) on dialysis ([place]) 06/25/2017  . Uncontrolled hypertension 05/28/2017  . Depression 05/28/2017  . Normocytic anemia 05/28/2017  . History of completed stroke 05/28/2017  . Syncope 03/13/2016  . Ischemic cardiomyopathy 03/13/2016  . Unstable angina pectoris ([place]) 01/11/2016  . Hypertensive heart disease with heart failure ([place])   . Chronic combined systolic and diastolic heart failure ([place])   . Elevated troponin   . Chest pain 01/10/2016  . Coronary artery disease due to lipid rich plaque 01/10/2016  . Status post coronary artery stent placement   . Type 2 diabetes mellitus with diabetic nephropathy, without long-term current use of insulin ([place])   . Tobacco abuse   . Essential hypertension   . Hypercholesteremia   . NSTEMI (non-ST elevated myocardial infarction) ([place]) 12/22/2015  . Lightheadedness 08/22/2012  . Dehydration 08/22/2012   Past Medical History:  Diagnosis Date  . Chest pain 01/10/2016  . Chronic combined systolic and diastolic heart failure ([place])   . Coronary artery disease    a.12/22/15: NSTEMI s/p overlapping DES x2 and balloon angioplasty to distal AV groove Circumflex (too small for a stent).   . Coronary artery disease due to lipid rich  plaque 01/10/2016  . Dehydration 08/22/2012  . Depression   . Elevated troponin   . ESRD (end stage renal disease) on dialysis ([place])   . Essential hypertension   . History of completed stroke 05/28/2017  . Hypercholesteremia   . Hypertension   . Hypertensive heart disease with heart failure ([place])   . Ischemic cardiomyopathy 03/13/2016  . Normocytic anemia 05/28/2017  . NSTEMI (non-ST elevated myocardial infarction) ([place]) 12/22/2015  . Prostate infection   . Status post coronary artery stent placement   . Syncope 03/13/2016  . Tobacco abuse   . Type 2 diabetes mellitus with diabetic nephropathy, without long-term current use of insulin ([place])   . Type II diabetes mellitus ([place])   . Uncontrolled hypertension 05/28/2017  . Unstable angina pectoris ([place]) 01/11/2016   Past Surgical History:  Procedure Laterality Date  . BASCILIC VEIN TRANSPOSITION Left 06/28/2017   Procedure: LEFT UPPER ARM ARTERIOVENOUS GORTEX-GRAFT PLACEMENT;  Surgeon: Angelia Mould, MD;  Location: [facility];  Service: Vascular;  Laterality: Left;  . CARDIAC CATHETERIZATION N/A 12/22/2015   Procedure: Left Heart Cath and Coronary Angiography;  Surgeon: Burnell Blanks, MD;  Location: [place] [facility];  Service: Cardiovascular;  Laterality: N/A;  . CARDIAC CATHETERIZATION N/A 12/22/2015   Procedure: Coronary Stent Intervention;  Surgeon: Burnell Blanks, MD;  Location: [place] CV LAB;  Service: Cardiovascular;  Laterality: N/A;  . CARDIAC CATHETERIZATION N/A 01/11/2016   Procedure: Left Heart Cath and Coronary Angiography;  Surgeon: Sherren Mocha, MD;  Location: [facility]  INVASIVE CV LAB;  Service: Cardiovascular;  Laterality: N/A;  . CARDIAC CATHETERIZATION N/A 01/11/2016   Procedure: Intravascular Pressure Wire/FFR Study;  Surgeon: Sherren Mocha, MD;  Location: [place] CV LAB;  Service: Cardiovascular;  Laterality: N/A;  . CARDIAC CATHETERIZATION N/A 01/11/2016   Procedure: Coronary Stent Intervention;   Surgeon: Sherren Mocha, MD;  Location: [place] CV LAB;  Service: Cardiovascular;  Laterality: N/A;  . CORONARY ANGIOPLASTY WITH STENT PLACEMENT  12/22/2015  . INSERTION OF DIALYSIS CATHETER Right 06/28/2017   Procedure: INSERTION OF DIALYSIS CATHETER RIGHT INTERNAL JUGULAR;  Surgeon: Angelia Mould, MD;  Location: [facility] OR;  Service: Vascular;  Laterality: Right;   No Known Allergies Prior to Admission medications   Medication Sig Start Date End Date Taking? Authorizing Provider  aspirin 81 MG chewable tablet CHEW 1 TABLET BY MOUTH DAILY 02/07/18  Yes [name], MD  atorvastatin (LIPITOR) 10 MG tablet Take 1 tablet (10 mg total) by mouth daily. 04/24/18  Yes [name], MD  B Complex-C-Folic Acid (RENA-VITE RX) 1 MG TABS TAKE ONE TABLET BY MOUTH AT BEDTIME 02/13/18  Yes [name], MD  calcitRIOL (ROCALTROL) 0.5 MCG capsule TAKE 1 CAPSULE BY MOUTH ON MONDAY, WEDNESDAY, AND FRIDAY WITH HEMODIALYSIS 03/17/18  Yes [name], MD  carvedilol (COREG) 6.25 MG tablet Take 1 tablet (6.25 mg total) by mouth 2 (two) times daily with a meal. 04/24/18  Yes [name], MD  clopidogrel (PLAVIX) 75 MG tablet Take 1 tablet (75 mg total) by mouth daily. 04/24/18  Yes [name], MD  insulin aspart (NOVOLOG) 100 UNIT/ML FlexPen Inject 0-10 Units into the skin 3 (three) times daily before meals. Sliding Scale: 0-150: 0u; 151-200: 2u; 201-250: 4u; 251-300: 6u; 301-350: 8u; 351-400:10u. 401+ Give 10u and call MD 10/03/17  Yes [name], MD  Insulin Pen Needle (PEN NEEDLES) 32G X 4 MM MISC 1 Dose by Does not apply route 3 (three) times daily. Use with sliding scale novolog with meals. 10/03/17  Yes [name], MD  Lancets [place] DELICA PLUS [id number]) MISC Inject 1 application into the skin 2 (two) times daily as needed. Testing 1-2 times per day. 04/24/18  Yes [name], MD  Na Sulfate-K Sulfate-Mg Sulf 17.5-3.13-1.6 GM/177ML SOLN Take as directed for  colonoscopy prep. 01/14/18  Yes Levin Erp, PA  nitroGLYCERIN (NITROSTAT) 0.4 MG SL tablet Place 1 tablet (0.4 mg total) under the tongue every 5 (five) minutes x 3 doses as needed for chest pain. 12/26/15  Yes Cheryln Manly, NP  ondansetron (ZOFRAN ODT) 4 MG disintegrating tablet Take 1 tablet (4 mg total) by mouth every 8 (eight) hours as needed for nausea or vomiting. 01/26/18  Yes Horton, Barbette Hair, MD  sevelamer carbonate (RENVELA) 800 MG tablet Take 1 tablet (800 mg total) by mouth 3 (three) times daily with meals. 08/15/17  Yes [name], MD  STOOL SOFTENER 100 MG capsule TAKE ONE CAPSULE BY MOUTH TWICE A DAY 03/31/18  Yes [name], MD  traZODone (DESYREL) 50 MG tablet Take 1 tablet (50 mg total) by mouth at bedtime. 04/24/18  Yes [name], MD  glucose blood [facility] VERIO) test strip 1 each by Other route as needed for other. Use as instructed 1-2 times per day. 04/24/18   [name], MD   Social History   Socioeconomic History  . Marital status: Married    Spouse name: Not on file  . Number of  children: Not on file  . Years of education: Not on file  . Highest education level: Not on file  Occupational History  . Occupation: International aid/development worker: [employer]  Social Needs  . Financial resource strain: Not on file  . Food insecurity:    Worry: Not on file    Inability: Not on file  . Transportation needs:    Medical: Not on file    Non-medical: Not on file  Tobacco Use  . Smoking status: Former Smoker    Packs/day: 0.75    Years: 29.00    Pack years: 21.75    Types: Cigarettes    Last attempt to quit: 05/28/2017    Years since quitting: 0.9  . Smokeless tobacco: Never Used  Substance and Sexual Activity  . Alcohol use: Not Currently    Alcohol/week: 7.0 standard drinks    Types: 7 Cans of beer per week    Frequency: Never    Comment: last drink was prior to last hospitalization  . Drug use: No  . Sexual  activity: Not Currently  Lifestyle  . Physical activity:    Days per week: Not on file    Minutes per session: Not on file  . Stress: Not on file  Relationships  . Social connections:    Talks on phone: Not on file    Gets together: Not on file    Attends religious service: Not on file    Active member of club or organization: Not on file    Attends meetings of clubs or organizations: Not on file    Relationship status: Not on file  . Intimate partner violence:    Fear of current or ex partner: Not on file    Emotionally abused: Not on file    Physically abused: Not on file    Forced sexual activity: Not on file  Other Topics Concern  . Not on file  Social History Narrative  . Not on file       Objective:   Physical Exam Vitals signs reviewed.  Constitutional:      Appearance: He is well-developed.  HENT:     Head: Normocephalic and atraumatic.  Eyes:     Pupils: Pupils are equal, round, and reactive to light.  Neck:     Vascular: No carotid bruit or JVD.  Cardiovascular:     Rate and Rhythm: Normal rate and regular rhythm.     Heart sounds: Normal heart sounds. No murmur.  Pulmonary:     Effort: Pulmonary effort is normal.     Breath sounds: Normal breath sounds. No rales.  Skin:    General: Skin is warm and dry.  Neurological:     Mental Status: He is alert and oriented to person, place, and time.    Vitals:   04/24/18 1028  BP: 118/62  Pulse: 72  Resp: 14  Temp: 98.3 F (36.8 C)  TempSrc: Oral  SpO2: 96%  Weight: 130 lb (59 kg)      Assessment & Plan:   Jeffery Young is a 53 y.o. male Type 2 diabetes mellitus with diabetic nephropathy, without long-term current use of insulin ([place]) - Plan: Lancets (ONETOUCH DELICA PLUS [id number]) MISC, glucose blood (ONETOUCH VERIO) test strip  Coronary artery disease involving native heart without angina pectoris, unspecified vessel or lesion type - Plan: atorvastatin (LIPITOR) 10 MG tablet  Special screening for  malignant neoplasms, colon - Plan: Ambulatory referral to Gastroenterology  Need  for prophylactic vaccination against Streptococcus pneumoniae (pneumococcus)  Hypercholesteremia - Plan: Lipid Panel, Hepatic Function Panel  Essential hypertension - Plan: carvedilol (COREG) 6.25 MG tablet  Depression, unspecified depression type - Plan: traZODone (DESYREL) 50 MG tablet  History of CVA (cerebrovascular accident) without residual deficits - Plan: carvedilol (COREG) 6.25 MG tablet, clopidogrel (PLAVIX) 75 MG tablet  Reports overall stable control diabetes with diet control only, has not required any insulin recently.  Continue to monitor with repeat testing in 2 to 3 months.  Hypertension controlled, continue carvedilol same dose, refilled.  Follow-up with cardiology as planned.  Check lipids, continue Lipitor same dose for now with secondary prevention from previous CVA and CAD.  Continue trazodone same dose for insomnia.  Disposition, still looking into options for assisted living/skilled nursing facilities.  Meds ordered this encounter  Medications  . atorvastatin (LIPITOR) 10 MG tablet    Sig: Take 1 tablet (10 mg total) by mouth daily.    Dispense:  90 tablet    Refill:  2  . carvedilol (COREG) 6.25 MG tablet    Sig: Take 1 tablet (6.25 mg total) by mouth 2 (two) times daily with a meal.    Dispense:  180 tablet    Refill:  2  . clopidogrel (PLAVIX) 75 MG tablet    Sig: Take 1 tablet (75 mg total) by mouth daily.    Dispense:  90 tablet    Refill:  2  . traZODone (DESYREL) 50 MG tablet    Sig: Take 1 tablet (50 mg total) by mouth at bedtime.    Dispense:  90 tablet    Refill:  1  . Lancets (ONETOUCH DELICA PLUS [id number]) MISC    Sig: Inject 1 application into the skin 2 (two) times daily as needed. Testing 1-2 times per day.    Dispense:  100 each    Refill:  2  . glucose blood (ONETOUCH VERIO) test strip    Sig: 1 each by Other route as needed for other. Use as  instructed 1-2 times per day.    Dispense:  100 each    Refill:  2   Patient Instructions   No change in medication for now.  Keep follow-up with cardiology as planned.  Avoid groups, make sure to wash hands thoroughly during this time and avoid those who are sick. We can check A1c in 2 -3 months, but let me know if blood sugars are running higher and that can be checked sooner.    If you have lab work done today you will be contacted with your lab results within the next 2 weeks.  If you have not heard from [place] then please contact us. The fastest way to get your results is to register for My Chart.   IF you received an x-ray today, you will receive an invoice from [facility]. Please contact [facility] at [phone] with questions or concerns regarding your invoice.   IF you received labwork today, you will receive an invoice from [facility]. Please contact [facility] at [phone] with questions or concerns regarding your invoice.   Our billing staff will not be able to assist you with questions regarding bills from these companies.  You will be contacted with the lab results as soon as they are available. The fastest way to get your results is to activate your My Chart account. Instructions are located on the last page of this paperwork. If you have not heard from [place] regarding the results in 2  weeks, please contact this office.       Signed,   Merri Ray, MD [facility] [place].  04/26/18 3:06 PM

## 2018-04-25 LAB — HEPATIC FUNCTION PANEL
ALT: 13 IU/L (ref 0–44)
AST: 11 IU/L (ref 0–40)
Albumin: 4.2 g/dL (ref 3.8–4.9)
Alkaline Phosphatase: 120 IU/L — ABNORMAL HIGH (ref 39–117)
Bilirubin Total: <0.2 mg/dL (ref 0.0–1.2)
Bilirubin, Direct: 0.08 mg/dL (ref 0.00–0.40)
Total Protein: 7.7 g/dL (ref 6.0–8.5)

## 2018-04-25 LAB — LIPID PANEL
Chol/HDL Ratio: 3.4 ratio (ref 0.0–5.0)
Cholesterol, Total: 148 mg/dL (ref 100–199)
HDL: 44 mg/dL (ref >39)
LDL Calculated: 62 mg/dL (ref 0–99)
Triglycerides: 209 mg/dL — ABNORMAL HIGH (ref 0–149)
VLDL Cholesterol Cal: 42 mg/dL — ABNORMAL HIGH (ref 5–40)

## 2018-04-25 NOTE — Telephone Encounter (Signed)
He did not call back and of course he would have been rescheduled anyway with the new COVID 19 guidelines. Please advise on how you would like to handle this.

## 2018-04-25 NOTE — Telephone Encounter (Signed)
Yes any elective cases are not being done right now due to COVID 19. Recommend be book a clinic visit in the next 6-8 weeks and can discuss at that time in the office again. Thanks

## 2018-04-26 ENCOUNTER — Encounter: Payer: Self-pay | Admitting: Family Medicine

## 2018-04-28 NOTE — Telephone Encounter (Signed)
Spoke with the spouse. She agrees to waiting due to the COVID19 guidelines. She is very firm on screening by colonoscopy and not any other method.

## 2018-05-01 ENCOUNTER — Ambulatory Visit: Payer: BC Managed Care – PPO | Admitting: Cardiovascular Disease

## 2018-05-05 ENCOUNTER — Other Ambulatory Visit: Payer: Self-pay | Admitting: Family Medicine

## 2018-05-06 ENCOUNTER — Other Ambulatory Visit: Payer: Self-pay | Admitting: Family Medicine

## 2018-05-06 ENCOUNTER — Other Ambulatory Visit: Payer: Self-pay | Admitting: Emergency Medicine

## 2018-05-06 DIAGNOSIS — I509 Heart failure, unspecified: Secondary | ICD-10-CM

## 2018-05-06 DIAGNOSIS — F329 Major depressive disorder, single episode, unspecified: Secondary | ICD-10-CM

## 2018-05-06 DIAGNOSIS — F32A Depression, unspecified: Secondary | ICD-10-CM

## 2018-05-06 NOTE — Telephone Encounter (Signed)
This was discontinued but now pt is requesting a refill. Please advise

## 2018-06-09 ENCOUNTER — Telehealth: Payer: Self-pay

## 2018-06-09 NOTE — Telephone Encounter (Signed)
Virtual Visit Pre-Appointment Phone Call  "(Name), I am calling you today to discuss your upcoming appointment. We are currently trying to limit exposure to the virus that causes COVID-19 by seeing patients at home rather than in the office."  1. "What is the BEST phone number to call the day of the visit?" - include this in appointment notes  2. "Do you have or have access to (through a family member/friend) a smartphone with video capability that we can use for your visit?" a. If yes - list this number in appt notes as "cell" (if different from BEST phone #) and list the appointment type as a VIDEO visit in appointment notes b. If no - list the appointment type as a PHONE visit in appointment notes  3. Confirm consent - "In the setting of the current Covid19 crisis, you are scheduled for a (phone or video) visit with your provider on (date) at (time).  Just as we do with many in-office visits, in order for you to participate in this visit, we must obtain consent.  If you'd like, I can send this to your mychart (if signed up) or email for you to review.  Otherwise, I can obtain your verbal consent now.  All virtual visits are billed to your insurance company just like a normal visit would be.  By agreeing to a virtual visit, we'd like you to understand that the technology does not allow for your provider to perform an examination, and thus may limit your provider's ability to fully assess your condition. If your provider identifies any concerns that need to be evaluated in person, we will make arrangements to do so.  Finally, though the technology is pretty good, we cannot assure that it will always work on either your or our end, and in the setting of a video visit, we may have to convert it to a phone-only visit.  In either situation, we cannot ensure that we have a secure connection.  Are you willing to proceed?" STAFF: Did the patient verbally acknowledge consent to telehealth visit? yes  4.  Advise patient to be prepared - "Two hours prior to your appointment, go ahead and check your blood pressure, pulse, oxygen saturation, and your weight (if you have the equipment to check those) and write them all down. When your visit starts, your provider will ask you for this information. If you have an Apple Watch or Kardia device, please plan to have heart rate information ready on the day of your appointment. Please have a pen and paper handy nearby the day of the visit as well."  5. Give patient instructions for MyChart download to smartphone OR Doximity/Doxy.me as below if video visit (depending on what platform provider is using)  6. Inform patient they will receive a phone call 15 minutes prior to their appointment time (may be from unknown caller ID) so they should be prepared to answer    TELEPHONE CALL NOTE  Turhan Chill has been deemed a candidate for a follow-up tele-health visit to limit community exposure during the Covid-19 pandemic. I spoke with the patient via phone to ensure availability of phone/video source, confirm preferred email & phone number, and discuss instructions and expectations.  I reminded Hafiz Irion to be prepared with any vital sign and/or heart rhythm information that could potentially be obtained via home monitoring, at the time of his visit. I reminded Ardell Makarewicz to expect a phone call prior to his visit.  Ludger Nutting Willodean Leven,  CMA 06/09/2018 3:14 PM   INSTRUCTIONS FOR DOWNLOADING THE MYCHART APP TO SMARTPHONE  - The patient must first make sure to have activated MyChart and know their login information - If Apple, go to [employer] and type in MyChart in the search bar and download the app. If Android, ask patient to go to [employer] and type in [place] in the search bar and download the app. The app is free but as with any other app downloads, their phone may require them to verify saved payment information or Apple/Android password.  - The patient  will need to then log into the app with their MyChart username and password, and select [facility] as their healthcare provider to link the account. When it is time for your visit, go to the MyChart app, find appointments, and click Begin Video Visit. Be sure to Select Allow for your device to access the Microphone and Camera for your visit. You will then be connected, and your provider will be with you shortly.  **If they have any issues connecting, or need assistance please contact MyChart service desk [phone])**  **If using a computer, in order to ensure the best quality for their visit they will need to use either of the following Internet Browsers: [employer], or Google Chrome**  IF USING DOXIMITY or DOXY.ME - The patient will receive a link just prior to their visit by text.     FULL LENGTH CONSENT FOR TELE-HEALTH VISIT   I hereby voluntarily request, consent and authorize [facility] and its employed or contracted physicians, physician assistants, nurse practitioners or other licensed health care professionals (the Practitioner), to provide me with telemedicine health care services (the "Services") as deemed necessary by the treating Practitioner. I acknowledge and consent to receive the Services by the Practitioner via telemedicine. I understand that the telemedicine visit will involve communicating with the Practitioner through live audiovisual communication technology and the disclosure of certain medical information by electronic transmission. I acknowledge that I have been given the opportunity to request an in-person assessment or other available alternative prior to the telemedicine visit and am voluntarily participating in the telemedicine visit.  I understand that I have the right to withhold or withdraw my consent to the use of telemedicine in the course of my care at any time, without affecting my right to future care or treatment, and that the Practitioner  or I may terminate the telemedicine visit at any time. I understand that I have the right to inspect all information obtained and/or recorded in the course of the telemedicine visit and may receive copies of available information for a reasonable fee.  I understand that some of the potential risks of receiving the Services via telemedicine include:  Marland Kitchen Delay or interruption in medical evaluation due to technological equipment failure or disruption; . Information transmitted may not be sufficient (e.g. poor resolution of images) to allow for appropriate medical decision making by the Practitioner; and/or  . In rare instances, security protocols could fail, causing a breach of personal health information.  Furthermore, I acknowledge that it is my responsibility to provide information about my medical history, conditions and care that is complete and accurate to the best of my ability. I acknowledge that Practitioner's advice, recommendations, and/or decision may be based on factors not within their control, such as incomplete or inaccurate data provided by me or distortions of diagnostic images or specimens that may result from electronic transmissions. I understand that the practice of  medicine is not an Chief Strategy Officer and that Practitioner makes no warranties or guarantees regarding treatment outcomes. I acknowledge that I will receive a copy of this consent concurrently upon execution via email to the email address I last provided but may also request a printed copy by calling the office of [facility].    I understand that my insurance will be billed for this visit.   I have read or had this consent read to me. . I understand the contents of this consent, which adequately explains the benefits and risks of the Services being provided via telemedicine.  . I have been provided ample opportunity to ask questions regarding this consent and the Services and have had my questions answered to my satisfaction.  . I give my informed consent for the services to be provided through the use of telemedicine in my medical care  By participating in this telemedicine visit I agree to the above.

## 2018-06-09 NOTE — Telephone Encounter (Signed)
Called pt to set them up for evisit with ML on 06/10/2018. Left message asking to call the office. If pt returns call please get number and time that is best to reach them. Thank you.

## 2018-06-10 ENCOUNTER — Other Ambulatory Visit: Payer: Self-pay

## 2018-06-10 ENCOUNTER — Telehealth (INDEPENDENT_AMBULATORY_CARE_PROVIDER_SITE_OTHER): Payer: BC Managed Care – PPO | Admitting: Physician Assistant

## 2018-06-10 ENCOUNTER — Encounter: Payer: Self-pay | Admitting: Physician Assistant

## 2018-06-10 VITALS — Ht 65.0 in | Wt 121.7 lb

## 2018-06-10 DIAGNOSIS — N186 End stage renal disease: Secondary | ICD-10-CM | POA: Diagnosis not present

## 2018-06-10 DIAGNOSIS — Z992 Dependence on renal dialysis: Secondary | ICD-10-CM

## 2018-06-10 DIAGNOSIS — E782 Mixed hyperlipidemia: Secondary | ICD-10-CM

## 2018-06-10 DIAGNOSIS — I255 Ischemic cardiomyopathy: Secondary | ICD-10-CM | POA: Diagnosis not present

## 2018-06-10 DIAGNOSIS — Z8673 Personal history of transient ischemic attack (TIA), and cerebral infarction without residual deficits: Secondary | ICD-10-CM

## 2018-06-10 DIAGNOSIS — I2510 Atherosclerotic heart disease of native coronary artery without angina pectoris: Secondary | ICD-10-CM

## 2018-06-10 DIAGNOSIS — I10 Essential (primary) hypertension: Secondary | ICD-10-CM

## 2018-06-10 MED ORDER — CARVEDILOL 6.25 MG PO TABS: 6.2500 mg | ORAL_TABLET | Freq: Two times a day (BID) | ORAL | 3 refills | Status: DC

## 2018-06-10 MED ORDER — ATORVASTATIN CALCIUM 10 MG PO TABS: 10.0000 mg | ORAL_TABLET | Freq: Every day | ORAL | 3 refills | Status: DC

## 2018-06-10 NOTE — Patient Instructions (Signed)

## 2018-06-10 NOTE — Progress Notes (Signed)
Virtual Visit via Video Note   This visit type was conducted due to national recommendations for restrictions regarding the COVID-19 Pandemic (e.g. social distancing) in an effort to limit this patient's exposure and mitigate transmission in our community.  Due to his co-morbid illnesses, this patient is at least at moderate risk for complications without adequate follow up.  This format is felt to be most appropriate for this patient at this time.  All issues noted in this document were discussed and addressed.  A limited physical exam was performed with this format.  Please refer to the patient's chart for his consent to telehealth for [facility].   Date:  06/10/2018   ID:  Jeffery Young, DOB 08/29/1965, MRN [id number]  Patient Location: Home Provider Location: Home  PCP:  [name], MD  Cardiologist:  Lauree Chandler, MD  Electrophysiologist:  None   Evaluation Performed:  Follow-Up Visit  Chief Complaint:  F/U  History of Present Illness:    Jeffery Young is a 53 y.o. male with history of CAD status post and STEMI treated with Promus DES to the circumflex 01/2016 as well as DES to the distal circumflex.  He had moderate stenosis of a proximal OM branch however FFR testing was normal.  EF initially low at 30% and placed with a LifeVest but follow-up echo 02/2016 EF 55 to 60%.  Brilinta was stopped due to dyspnea and Plavix added.  He had orthostatic symptoms and hydralazine had to be reduced.  Patient last saw Ellen Henri, PA-C 10/10/2017 after an admission with multiple TIAs and renal failure and had to be started on dialysis.  30-day monitor did not reveal any A. fib.  2D echo 10/17/2017 normal LVEF 60 to 65% with grade 2 DD no cardiac source of emboli.  Denies chest tightness, shortness of breath, palpitations, edema, dizziness or presyncope. Has chronic cough from COPD. Overall doing well.   The patient does not have symptoms concerning for COVID-19 infection  (fever, chills, cough, or new shortness of breath).    Past Medical History:  Diagnosis Date  . Chest pain 01/10/2016  . Chronic combined systolic and diastolic heart failure ([place])   . Coronary artery disease    a.12/22/15: NSTEMI s/p overlapping DES x2 and balloon angioplasty to distal AV groove Circumflex (too small for a stent).   . Coronary artery disease due to lipid rich plaque 01/10/2016  . Dehydration 08/22/2012  . Depression   . Elevated troponin   . ESRD (end stage renal disease) on dialysis ([place])   . Essential hypertension   . History of completed stroke 05/28/2017  . Hypercholesteremia   . Hypertension   . Hypertensive heart disease with heart failure ([place])   . Ischemic cardiomyopathy 03/13/2016  . Normocytic anemia 05/28/2017  . NSTEMI (non-ST elevated myocardial infarction) ([place]) 12/22/2015  . Prostate infection   . Status post coronary artery stent placement   . Syncope 03/13/2016  . Tobacco abuse   . Type 2 diabetes mellitus with diabetic nephropathy, without long-term current use of insulin ([place])   . Type II diabetes mellitus ([place])   . Uncontrolled hypertension 05/28/2017  . Unstable angina pectoris ([place]) 01/11/2016   Past Surgical History:  Procedure Laterality Date  . BASCILIC VEIN TRANSPOSITION Left 06/28/2017   Procedure: LEFT UPPER ARM ARTERIOVENOUS GORTEX-GRAFT PLACEMENT;  Surgeon: Angelia Mould, MD;  Location: [facility];  Service: Vascular;  Laterality: Left;  . CARDIAC CATHETERIZATION N/A 12/22/2015   Procedure: Left Heart Cath and Coronary  Angiography;  Surgeon: Burnell Blanks, MD;  Location: [facility] [place] [facility];  Service: Cardiovascular;  Laterality: N/A;  . CARDIAC CATHETERIZATION N/A 12/22/2015   Procedure: Coronary Stent Intervention;  Surgeon: Burnell Blanks, MD;  Location: [place] [facility];  Service: Cardiovascular;  Laterality: N/A;  . CARDIAC CATHETERIZATION N/A 01/11/2016   Procedure: Left Heart Cath and Coronary Angiography;   Surgeon: Sherren Mocha, MD;  Location: [facility] CV LAB;  Service: Cardiovascular;  Laterality: N/A;  . CARDIAC CATHETERIZATION N/A 01/11/2016   Procedure: Intravascular Pressure Wire/FFR Study;  Surgeon: Sherren Mocha, MD;  Location: [facility];  Service: Cardiovascular;  Laterality: N/A;  . CARDIAC CATHETERIZATION N/A 01/11/2016   Procedure: Coronary Stent Intervention;  Surgeon: Sherren Mocha, MD;  Location: [facility];  Service: Cardiovascular;  Laterality: N/A;  . CORONARY ANGIOPLASTY WITH STENT PLACEMENT  12/22/2015  . INSERTION OF DIALYSIS CATHETER Right 06/28/2017   Procedure: INSERTION OF DIALYSIS CATHETER RIGHT INTERNAL JUGULAR;  Surgeon: Angelia Mould, MD;  [facility];  Service: Vascular;  Laterality: Right;     Current Meds  Medication Sig  . aspirin 81 MG chewable tablet CHEW ONE TABLET BY MOUTH DAILY  . atorvastatin (LIPITOR) 10 MG tablet Take 1 tablet (10 mg total) by mouth daily.  . B Complex-C-Folic Acid (RENA-VITE RX) 1 MG TABS TAKE ONE TABLET BY MOUTH AT BEDTIME  . calcitRIOL (ROCALTROL) 0.5 MCG capsule TAKE 1 CAPSULE BY MOUTH ON MONDAY, WEDNESDAY, AND FRIDAY WITH HEMODIALYSIS  . carvedilol (COREG) 6.25 MG tablet Take 1 tablet (6.25 mg total) by mouth 2 (two) times daily with a meal.  . clopidogrel (PLAVIX) 75 MG tablet Take 1 tablet (75 mg total) by mouth daily.  Marland Kitchen glucose blood (ONETOUCH VERIO) test strip 1 each by Other route as needed for other. Use as instructed 1-2 times per day.  . nitroGLYCERIN (NITROSTAT) 0.4 MG SL tablet Place 1 tablet (0.4 mg total) under the tongue every 5 (five) minutes x 3 doses as needed for chest pain.  Marland Kitchen ondansetron (ZOFRAN ODT) 4 MG disintegrating tablet Take 1 tablet (4 mg total) by mouth every 8 (eight) hours as needed for nausea or vomiting.  . sevelamer carbonate (RENVELA) 800 MG tablet Take 1 tablet (800 mg total) by mouth 3 (three) times daily with meals.  . STOOL SOFTENER 100 MG capsule TAKE ONE CAPSULE  BY MOUTH TWICE A DAY  . traZODone (DESYREL) 50 MG tablet Take 1 tablet (50 mg total) by mouth at bedtime.  . [DISCONTINUED] atorvastatin (LIPITOR) 10 MG tablet Take 1 tablet (10 mg total) by mouth daily.  . [DISCONTINUED] carvedilol (COREG) 6.25 MG tablet Take 1 tablet (6.25 mg total) by mouth 2 (two) times daily with a meal.     Allergies:   Patient has no known allergies.   Social History   Tobacco Use  . Smoking status: Former Smoker    Packs/day: 0.75    Years: 29.00    Pack years: 21.75    Types: Cigarettes    Last attempt to quit: 05/28/2017    Years since quitting: 1.0  . Smokeless tobacco: Never Used  Substance Use Topics  . Alcohol use: Not Currently    Alcohol/week: 7.0 standard drinks    Types: 7 Cans of beer per week    Frequency: Never    Comment: last drink was prior to last hospitalization  . Drug use: No     Family Hx: The patient's family history includes CAD in  his brother and brother; CVA in his brother; Chronic Renal Failure (age of onset: 61) in his brother.  ROS:   Please see the history of present illness.    Review of Systems  Constitution: Negative.  HENT: Negative.   Cardiovascular: Negative.   Respiratory: Positive for cough.   Endocrine: Negative.   Hematologic/Lymphatic: Negative.   Musculoskeletal: Negative.   Gastrointestinal: Negative.   Genitourinary: Negative.   Neurological: Positive for difficulty with concentration, disturbances in coordination, focal weakness, loss of balance and weakness.  Psychiatric/Behavioral: Positive for memory loss.    All other systems reviewed and are negative.   Prior CV studies:   The following studies were reviewed today:  30-day monitor 10/2017 Sinus rhythm with sinus bradycardia, lowest heart rate 52 beats per minute.  No premature beats, atrial arrhythmias or ventricular arrhythmias.   2D echo 10/2017 Study Conclusions   - Left ventricle: The cavity size was normal. Wall thickness was    normal. Systolic function was normal. The estimated ejection   fraction was in the range of 60% to 65%. Wall motion was normal;   there were no regional wall motion abnormalities. Features are   consistent with a pseudonormal left ventricular filling pattern,   with concomitant abnormal relaxation and increased filling   pressure (grade 2 diastolic dysfunction). - Tricuspid valve: There was mild regurgitation.   Impressions:   - No cardiac source of emboli was indentified. Compared to the   prior study, there has been no significant interval change.     Labs/Other Tests and Data Reviewed:    EKG:  An ECG dated 01/27/2018 was personally reviewed today and demonstrated:  Sinus tachycardia at 107 bpm poor R wave progression anteriorly  Recent Labs: 06/25/2017: Magnesium 2.7 01/26/2018: BUN 35; Creatinine, Ser 4.54; Hemoglobin 10.5; Platelets 109; Potassium 3.8; Sodium 137 04/24/2018: ALT 13   Recent Lipid Panel Lab Results  Component Value Date/Time   CHOL 148 04/24/2018 12:08 PM   TRIG 209 (H) 04/24/2018 12:08 PM   HDL 44 04/24/2018 12:08 PM   CHOLHDL 3.4 04/24/2018 12:08 PM   CHOLHDL 3.9 12/23/2015 03:36 AM   LDLCALC 62 04/24/2018 12:08 PM    Wt Readings from Last 3 Encounters:  06/10/18 121 lb 11.2 oz (55.2 kg)  04/24/18 130 lb (59 kg)  03/11/18 128 lb 12.8 oz (58.4 kg)     Objective:    Vital Signs:  Ht 5\' 5"  (1.651 m)   Wt 121 lb 11.2 oz (55.2 kg)   BMI 20.25 kg/m  Weight 121.7 lbs.  VITAL SIGNS:  reviewed GEN:  no acute distress RESPIRATORY:  normal respiratory effort, symmetric expansion CARDIOVASCULAR:  no peripheral edema  ASSESSMENT & PLAN:    CAD S/P NSTEMI treated with DES to the circumflex 2017 and DES to the distal circumflex with moderate stenosis of the proximal OM branch but FFR are normal. No angina on Plavix. No bleeding problems.  Ischemic cardiomyopathy ejection fraction initially 30% but most recent echo 10/2017 normal LVEF  History of TIAs  30-day monitor without A. fib and 2D echo no source of emboli/2019. Walks with a walker inside, wheelchair outside. Memory problems  ESRD on hemodialysis going well.  Essential hypertension-monitored at dialysis  Hyperipidemia on Lipitor LDL 62 triglycerides 209 & LFT's stable-04/24/18   COVID-19 Education: The signs and symptoms of COVID-19 were discussed with the patient and how to seek care for testing (follow up with PCP or arrange E-visit). The importance of social distancing was discussed today.  Time:   Today, I have spent 15 minutes with the patient with telehealth technology discussing the above problems.     Medication Adjustments/Labs and Tests Ordered: Current medicines are reviewed at length with the patient today.  Concerns regarding medicines are outlined above.   Tests Ordered: No orders of the defined types were placed in this encounter.   Medication Changes: Meds ordered this encounter  Medications  . atorvastatin (LIPITOR) 10 MG tablet    Sig: Take 1 tablet (10 mg total) by mouth daily.    Dispense:  90 tablet    Refill:  3  . carvedilol (COREG) 6.25 MG tablet    Sig: Take 1 tablet (6.25 mg total) by mouth 2 (two) times daily with a meal.    Dispense:  180 tablet    Refill:  3    Disposition:  Follow up in 6 month(s) Dr. Angelena Form  Signed, Jeffery Barrios, PA-C  06/10/2018 12:40 PM    [place]

## 2018-06-23 ENCOUNTER — Other Ambulatory Visit: Payer: Self-pay

## 2018-06-23 DIAGNOSIS — E1121 Type 2 diabetes mellitus with diabetic nephropathy: Secondary | ICD-10-CM

## 2018-06-24 ENCOUNTER — Other Ambulatory Visit: Payer: Self-pay

## 2018-06-24 ENCOUNTER — Encounter: Payer: Self-pay | Admitting: Family Medicine

## 2018-06-24 ENCOUNTER — Telehealth (INDEPENDENT_AMBULATORY_CARE_PROVIDER_SITE_OTHER): Payer: BC Managed Care – PPO | Admitting: Family Medicine

## 2018-06-24 ENCOUNTER — Other Ambulatory Visit: Payer: Self-pay | Admitting: Family Medicine

## 2018-06-24 DIAGNOSIS — N186 End stage renal disease: Secondary | ICD-10-CM

## 2018-06-24 DIAGNOSIS — Z992 Dependence on renal dialysis: Secondary | ICD-10-CM | POA: Diagnosis not present

## 2018-06-24 DIAGNOSIS — E1122 Type 2 diabetes mellitus with diabetic chronic kidney disease: Secondary | ICD-10-CM

## 2018-06-24 NOTE — Progress Notes (Signed)
Virtual Visit via Video Note  I connected with Jeffery Young on 06/24/18 at 10:38 AM by a video enabled telemedicine application and verified that I am speaking with the correct person using two identifiers.   I discussed the limitations, risks, security and privacy concerns of performing an evaluation and management service by telephone and the availability of in person appointments. I also discussed with the patient that there may be a patient responsible charge related to this service. The patient expressed understanding and agreed to proceed, consent obtained  Chief complaint: Diabetes  History of Present Illness: Jeffery Young is a 53 y.o. male  Diabetes: Complicated by diabetic nephropathy, chronic kidney disease on dialysis. Currently on diet control only as stable A1c off of sliding scale insulin.  Followed by nephrology. Reportedly had Pneumovax at dialysis clinic last October. On statin, with Lipitor 10 mg daily.   Home readings: High of 120, lowest 90. No sliding scale insulin - no meds - diet control only. Had A1c at kidney center on 05/26/18, reading of 6.6, glucose 133.  Due to Covid - living situation discussion is on hold.   Employer has paperwork for his employer. Not working. Long term disability through employer - may need paperwork completed again. Some has been completed by nephrology.   Lab Results  Component Value Date   HGBA1C 6.7 (H) 03/06/2018   HGBA1C 6.8 (H) 09/05/2017   HGBA1C 6.9 (H) 05/29/2017   Lab Results  Component Value Date   LDLCALC 62 04/24/2018   CREATININE 4.54 (H) 01/26/2018      Patient Active Problem List   Diagnosis Date Noted  . COPD GOLD III  10/24/2017  . CAD (coronary artery disease), native coronary artery 10/10/2017  . ESRD (end stage renal disease) on dialysis ([place]) 06/25/2017  . Uncontrolled hypertension 05/28/2017  . Depression 05/28/2017  . Normocytic anemia 05/28/2017  . History of completed stroke 05/28/2017  .  Syncope 03/13/2016  . Ischemic cardiomyopathy 03/13/2016  . Unstable angina pectoris ([place]) 01/11/2016  . Hypertensive heart disease with heart failure ([place])   . Chronic combined systolic and diastolic heart failure ([place])   . Elevated troponin   . Chest pain 01/10/2016  . Coronary artery disease due to lipid rich plaque 01/10/2016  . Status post coronary artery stent placement   . Type 2 diabetes mellitus with diabetic nephropathy, without long-term current use of insulin ([place])   . Tobacco abuse   . Essential hypertension   . Hypercholesteremia   . NSTEMI (non-ST elevated myocardial infarction) ([place]) 12/22/2015  . Lightheadedness 08/22/2012  . Dehydration 08/22/2012   Past Medical History:  Diagnosis Date  . Chest pain 01/10/2016  . Chronic combined systolic and diastolic heart failure ([place])   . Coronary artery disease    a.12/22/15: NSTEMI s/p overlapping DES x2 and balloon angioplasty to distal AV groove Circumflex (too small for a stent).   . Coronary artery disease due to lipid rich plaque 01/10/2016  . Dehydration 08/22/2012  . Depression   . Elevated troponin   . ESRD (end stage renal disease) on dialysis ([place])   . Essential hypertension   . History of completed stroke 05/28/2017  . Hypercholesteremia   . Hypertension   . Hypertensive heart disease with heart failure ([place])   . Ischemic cardiomyopathy 03/13/2016  . Normocytic anemia 05/28/2017  . NSTEMI (non-ST elevated myocardial infarction) ([place]) 12/22/2015  . Prostate infection   . Status post coronary artery stent placement   . Syncope 03/13/2016  . Tobacco  abuse   . Type 2 diabetes mellitus with diabetic nephropathy, without long-term current use of insulin ([place])   . Type II diabetes mellitus ([place])   . Uncontrolled hypertension 05/28/2017  . Unstable angina pectoris ([place]) 01/11/2016   Past Surgical History:  Procedure Laterality Date  . BASCILIC VEIN TRANSPOSITION Left 06/28/2017   Procedure: LEFT UPPER ARM ARTERIOVENOUS  GORTEX-GRAFT PLACEMENT;  Surgeon: Angelia Mould, MD;  Location: [place] [facility];  Service: Vascular;  Laterality: Left;  . CARDIAC CATHETERIZATION N/A 12/22/2015   Procedure: Left Heart Cath and Coronary Angiography;  Surgeon: Burnell Blanks, MD;  Location: [facility];  Service: Cardiovascular;  Laterality: N/A;  . CARDIAC CATHETERIZATION N/A 12/22/2015   Procedure: Coronary Stent Intervention;  Surgeon: Burnell Blanks, MD;  Location: [place] [facility];  Service: Cardiovascular;  Laterality: N/A;  . CARDIAC CATHETERIZATION N/A 01/11/2016   Procedure: Left Heart Cath and Coronary Angiography;  Surgeon: Sherren Mocha, MD;  Location: [place] [facility];  Service: Cardiovascular;  Laterality: N/A;  . CARDIAC CATHETERIZATION N/A 01/11/2016   Procedure: Intravascular Pressure Wire/FFR Study;  Surgeon: Sherren Mocha, MD;  Location: [facility];  Service: Cardiovascular;  Laterality: N/A;  . CARDIAC CATHETERIZATION N/A 01/11/2016   Procedure: Coronary Stent Intervention;  Surgeon: Sherren Mocha, MD;  Location: [facility];  Service: Cardiovascular;  Laterality: N/A;  . CORONARY ANGIOPLASTY WITH STENT PLACEMENT  12/22/2015  . INSERTION OF DIALYSIS CATHETER Right 06/28/2017   Procedure: INSERTION OF DIALYSIS CATHETER RIGHT INTERNAL JUGULAR;  Surgeon: Angelia Mould, MD;  [facility];  Service: Vascular;  Laterality: Right;   No Known Allergies Prior to Admission medications   Medication Sig Start Date End Date Taking? Authorizing Provider  aspirin 81 MG chewable tablet CHEW ONE TABLET BY MOUTH DAILY 05/06/18  Yes [name], MD  atorvastatin (LIPITOR) 10 MG tablet Take 1 tablet (10 mg total) by mouth daily. 06/10/18  Yes Imogene Burn, PA-C  calcitRIOL (ROCALTROL) 0.5 MCG capsule TAKE 1 CAPSULE BY MOUTH ON MONDAY, WEDNESDAY, AND FRIDAY WITH HEMODIALYSIS 03/17/18  Yes [name], MD  carvedilol (COREG) 6.25 MG tablet Take 1 tablet (6.25 mg  total) by mouth 2 (two) times daily with a meal. 06/10/18  Yes Imogene Burn, PA-C  clopidogrel (PLAVIX) 75 MG tablet Take 1 tablet (75 mg total) by mouth daily. 04/24/18  Yes [name], MD  glucose blood [facility] VERIO) test strip 1 each by Other route as needed for other. Use as instructed 1-2 times per day. 04/24/18  Yes [name], MD  nitroGLYCERIN (NITROSTAT) 0.4 MG SL tablet Place 1 tablet (0.4 mg total) under the tongue every 5 (five) minutes x 3 doses as needed for chest pain. 12/26/15  Yes Cheryln Manly, NP  ondansetron (ZOFRAN ODT) 4 MG disintegrating tablet Take 1 tablet (4 mg total) by mouth every 8 (eight) hours as needed for nausea or vomiting. 01/26/18  Yes Horton, Barbette Hair, MD  sevelamer carbonate (RENVELA) 800 MG tablet Take 1 tablet (800 mg total) by mouth 3 (three) times daily with meals. 08/15/17  Yes [name], MD  STOOL SOFTENER 100 MG capsule TAKE ONE CAPSULE BY MOUTH TWICE A DAY 05/05/18  Yes [name], MD  traZODone (DESYREL) 50 MG tablet Take 1 tablet (50 mg total) by mouth at bedtime. 04/24/18  Yes [name], MD  B Complex-C-Folic Acid (RENA-VITE RX) 1 MG TABS TAKE ONE TABLET BY MOUTH EVERY  NIGHT AT BEDTIME 06/24/18   [name], MD   Social History   Socioeconomic History  . Marital status: Married    Spouse name: Not on file  . Number of children: Not on file  . Years of education: Not on file  . Highest education level: Not on file  Occupational History  . Occupation: International aid/development worker: [employer]  Social Needs  . Financial resource strain: Not on file  . Food insecurity:    Worry: Not on file    Inability: Not on file  . Transportation needs:    Medical: Not on file    Non-medical: Not on file  Tobacco Use  . Smoking status: Former Smoker    Packs/day: 0.75    Years: 29.00    Pack years: 21.75    Types: Cigarettes    Last attempt to quit: 05/28/2017    Years since quitting: 1.0  .  Smokeless tobacco: Never Used  Substance and Sexual Activity  . Alcohol use: Not Currently    Alcohol/week: 7.0 standard drinks    Types: 7 Cans of beer per week    Frequency: Never    Comment: last drink was prior to last hospitalization  . Drug use: No  . Sexual activity: Not Currently  Lifestyle  . Physical activity:    Days per week: Not on file    Minutes per session: Not on file  . Stress: Not on file  Relationships  . Social connections:    Talks on phone: Not on file    Gets together: Not on file    Attends religious service: Not on file    Active member of club or organization: Not on file    Attends meetings of clubs or organizations: Not on file    Relationship status: Not on file  . Intimate partner violence:    Fear of current or ex partner: Not on file    Emotionally abused: Not on file    Physically abused: Not on file    Forced sexual activity: Not on file  Other Topics Concern  . Not on file  Social History Narrative  . Not on file    Observations/Objective: Intelligible speech, appropriate responses, no distress.  Additional history provided by family.  No concerns.  Assessment and Plan: Type 2 diabetes mellitus with chronic kidney disease on chronic dialysis, without long-term current use of insulin ([place])  -Doing well on diet control only.  No medications at this time.  Continue routine follow-up with nephrology.  Follow-up in 6 months for repeat A1c and med review.  Paperwork from employer apparently being sent, can review to see if other visit or telephone call needed for details  Follow Up Instructions:  6 months - diabetes, med review.    I discussed the assessment and treatment plan with the patient. The patient was provided an opportunity to ask questions and all were answered. The patient agreed with the plan and demonstrated an understanding of the instructions.   The patient was advised to call back or seek an in-person evaluation if the  symptoms worsen or if the condition fails to improve as anticipated.  I provided 7 minutes of non-face-to-face time during this encounter.   [name], MD

## 2018-06-24 NOTE — Progress Notes (Signed)
Pt c/o diabetes follow up.

## 2018-06-24 NOTE — Telephone Encounter (Signed)
Requested Prescriptions  Pending Prescriptions Disp Refills  . B Complex-C-Folic Acid (RENA-VITE RX) 1 MG TABS [Pharmacy Med Name: RENA-VITE RX TABLET] 90 tablet 0    Sig: TAKE ONE TABLET BY MOUTH EVERY NIGHT AT BEDTIME     There is no refill protocol information for this order

## 2018-06-25 ENCOUNTER — Telehealth: Payer: Self-pay

## 2018-06-25 NOTE — Telephone Encounter (Signed)
I call pts wife Gae Gallop that visit tomorrow will be at 1015 with JEssica NP video due to COVID 19. I receive verbal consent to do video and to file insurance from pts wife. The pt mobile is [phone] and cell carrier is [employer]. I reviewed the chart, updated the PCP, and the medications. The wife and pt knows  understands the process and knows to click link 10 minutes prior to visit.

## 2018-06-26 ENCOUNTER — Other Ambulatory Visit: Payer: Self-pay

## 2018-06-26 ENCOUNTER — Ambulatory Visit (INDEPENDENT_AMBULATORY_CARE_PROVIDER_SITE_OTHER): Payer: BC Managed Care – PPO | Admitting: Adult Health

## 2018-06-26 DIAGNOSIS — E785 Hyperlipidemia, unspecified: Secondary | ICD-10-CM

## 2018-06-26 DIAGNOSIS — F0631 Mood disorder due to known physiological condition with depressive features: Secondary | ICD-10-CM

## 2018-06-26 DIAGNOSIS — R2689 Other abnormalities of gait and mobility: Secondary | ICD-10-CM | POA: Diagnosis not present

## 2018-06-26 DIAGNOSIS — I69398 Other sequelae of cerebral infarction: Secondary | ICD-10-CM

## 2018-06-26 DIAGNOSIS — I10 Essential (primary) hypertension: Secondary | ICD-10-CM

## 2018-06-26 DIAGNOSIS — E1121 Type 2 diabetes mellitus with diabetic nephropathy: Secondary | ICD-10-CM

## 2018-06-26 DIAGNOSIS — I6381 Other cerebral infarction due to occlusion or stenosis of small artery: Secondary | ICD-10-CM

## 2018-06-26 DIAGNOSIS — R419 Unspecified symptoms and signs involving cognitive functions and awareness: Secondary | ICD-10-CM

## 2018-06-26 MED ORDER — SERTRALINE HCL 100 MG PO TABS: 100.0000 mg | ORAL_TABLET | Freq: Every day | ORAL | 2 refills | Status: DC

## 2018-06-26 NOTE — Progress Notes (Signed)
[facility] [address] [place]. [place] [address] [phone]       FOLLOW UP NOTE  [name] Date of Birth:  1965/09/02 Medical Record Number:  [id number]   Referring MD:  Jamison Neighbor Reason for visit: Stroke  Virtual Visit via Video Note  I connected with Jeffery Young on 07/01/18 at 10:15 AM EDT by a video enabled telemedicine application located remotely in my own home and verified that I am speaking with the correct person using two identifiers who was located at their own home and accompanied by his wife (legally separated) with whom he currently lives with.   I discussed the limitations of evaluation and management by telemedicine and the availability of in person appointments. The patient expressed understanding and agreed to proceed.    HPI: Initial visit 10/15/2017 :  Jeffery Young is a 53 y.o. with PMH of COPD, CAD s/p stents, ESRD on HD, HTN, depression, chronic combined systolic and diastolic heart failure, prior tobacco use, HLD and DM.  He complained of slurred speech, gait and balance difficulties and underwent CT head on 07/04/2017 which showed small right pontine and bilateral basal ganglia lacunar infarcts with mild degree of generalized cerebral atrophy.  He was initially evaluated in our office by Dr Leonie Man on 10/15/2017 with residual gait difficulty with use of rolling walker.  No further work-up has been done prior to initial office visit.  Recommended to complete stroke work-up with MRI brain on 11/20/2017 which showed remote H bilateral basal ganglia, thalamic and brainstem infarcts.  MRA of neck unremarkable.  MRA head showed diffuse intracranial arthrosclerotic changes in anterior circulation.  2D echo unremarkable.  Cardiac event monitoring negative for A. Fib.  At follow-up visit on 12/26/2017 he continues to have residual gait difficulties and new complaints of memory difficulties which she has been experiencing since her stroke.   During today's visit, he continued to have gait difficulties and cognitive deficits.  He currently lives with his wife (currently separated) and adult children.  Prior to his stroke, he was living independently and working full-time but moved back in after his stroke as he needed assistance.  Wife endorses frustration with his lack of motivation to improve and states that he will only ambulate to the bathroom but then will sit back on the couch where he spends most of the day.  He was previously receiving home therapy but due to his lack of participation, he was discharged from services.  Over the past few months, he has become more incontinent of urine and occasionally stool.  Patient states he was previously having difficulty getting to the bathroom in time and would end up urinating prior to getting to the bathroom and therefore they started placing him in briefs.  Since he has been in briefs, he has become more incontinent and wife is unsure if this is due to "laziness".  He is aware of when he needs to void and is unable to state whether incontinence is due to "laziness" or urgency sensation.  He will occasionally be incontinent of stool but wife endorses constipation.  He uses a wheelchair for long distance.  He denies any falls with rolling walker.  He states his memory has been stable in regards to his short-term memory which wife agrees with but also believes he has long-term memory issues such as not remembering important events from prior years.  His cognitive deficit has been stable without any worsening but denies improvement.  He continues on aspirin  and Plavix and atorvastatin 10 mg daily without reported side effects.  He denies new or worsening stroke/TIA symptoms.    Further evaluation regarding cognition and possible underlying depression which could be relating to his lack of motivation and willingness to care for himself, incontinence and motivation.  He does sleep well throughout the night  and will only nap occasionally.  Denies snoring.  He does undergo HD 3 times per week.  PHQ 9 and GAD 7 listed below but question accuracy due to variation in answers.  Per wife, questionable underlying depression prior to his stroke after he and his wife became separated.  She has been told by his daughter that he was "drinking a lot and stayed in a dark room".  While wife was initially speaking about her frustrations and is even currently looking into a long-term care facility due to his consistent daily care needs, he was laughing and smirking.  When she started speaking of their separation and potential underlying depression, he became tearful and very upset.  Her wife, he does not have family outside of herself and his children therefore his care relies mainly on them.  His wife feels as though he has just "given up hope" and has no desire to become independent with his daily functioning.  Attempted to question further but unable to adequately assess.       ROS:   14 system review of systems is positive for gait difficulty, incontinence, constipation, memory loss, confusion, depression, anxiety and all other systems negative  PMH:  Past Medical History:  Diagnosis Date  . Chest pain 01/10/2016  . Chronic combined systolic and diastolic heart failure ([place])   . Coronary artery disease    a.12/22/15: NSTEMI s/p overlapping DES x2 and balloon angioplasty to distal AV groove Circumflex (too small for a stent).   . Coronary artery disease due to lipid rich plaque 01/10/2016  . Dehydration 08/22/2012  . Depression   . Elevated troponin   . ESRD (end stage renal disease) on dialysis ([place])   . Essential hypertension   . History of completed stroke 05/28/2017  . Hypercholesteremia   . Hypertension   . Hypertensive heart disease with heart failure ([place])   . Ischemic cardiomyopathy 03/13/2016  . Normocytic anemia 05/28/2017  . NSTEMI (non-ST elevated myocardial infarction) ([place]) 12/22/2015  .  Prostate infection   . Status post coronary artery stent placement   . Syncope 03/13/2016  . Tobacco abuse   . Type 2 diabetes mellitus with diabetic nephropathy, without long-term current use of insulin ([place])   . Type II diabetes mellitus ([place])   . Uncontrolled hypertension 05/28/2017  . Unstable angina pectoris ([place]) 01/11/2016    Social History:  Social History   Socioeconomic History  . Marital status: Married    Spouse name: Not on file  . Number of children: Not on file  . Years of education: Not on file  . Highest education level: Not on file  Occupational History  . Occupation: International aid/development worker: [employer]  Social Needs  . Financial resource strain: Not on file  . Food insecurity:    Worry: Not on file    Inability: Not on file  . Transportation needs:    Medical: Not on file    Non-medical: Not on file  Tobacco Use  . Smoking status: Former Smoker    Packs/day: 0.75    Years: 29.00    Pack years: 21.75    Types:  Cigarettes    Last attempt to quit: 05/28/2017    Years since quitting: 1.0  . Smokeless tobacco: Never Used  Substance and Sexual Activity  . Alcohol use: Not Currently    Alcohol/week: 7.0 standard drinks    Types: 7 Cans of beer per week    Frequency: Never    Comment: last drink was prior to last hospitalization  . Drug use: No  . Sexual activity: Not Currently  Lifestyle  . Physical activity:    Days per week: Not on file    Minutes per session: Not on file  . Stress: Not on file  Relationships  . Social connections:    Talks on phone: Not on file    Gets together: Not on file    Attends religious service: Not on file    Active member of club or organization: Not on file    Attends meetings of clubs or organizations: Not on file    Relationship status: Not on file  . Intimate partner violence:    Fear of current or ex partner: Not on file    Emotionally abused: Not on file    Physically abused: Not on file    Forced sexual  activity: Not on file  Other Topics Concern  . Not on file  Social History Narrative  . Not on file    Medications:   Current Outpatient Medications on File Prior to Visit  Medication Sig Dispense Refill  . aspirin 81 MG chewable tablet CHEW ONE TABLET BY MOUTH DAILY 90 tablet 0  . atorvastatin (LIPITOR) 10 MG tablet Take 1 tablet (10 mg total) by mouth daily. 90 tablet 3  . B Complex-C-Folic Acid (RENA-VITE RX) 1 MG TABS TAKE ONE TABLET BY MOUTH EVERY NIGHT AT BEDTIME 90 tablet 0  . calcitRIOL (ROCALTROL) 0.5 MCG capsule TAKE 1 CAPSULE BY MOUTH ON MONDAY, WEDNESDAY, AND FRIDAY WITH HEMODIALYSIS 30 capsule 0  . carvedilol (COREG) 6.25 MG tablet Take 1 tablet (6.25 mg total) by mouth 2 (two) times daily with a meal. 180 tablet 3  . clopidogrel (PLAVIX) 75 MG tablet Take 1 tablet (75 mg total) by mouth daily. 90 tablet 2  . glucose blood (ONETOUCH VERIO) test strip 1 each by Other route as needed for other. Use as instructed 1-2 times per day. 100 each 2  . nitroGLYCERIN (NITROSTAT) 0.4 MG SL tablet Place 1 tablet (0.4 mg total) under the tongue every 5 (five) minutes x 3 doses as needed for chest pain. 25 tablet 3  . sevelamer carbonate (RENVELA) 800 MG tablet Take 1 tablet (800 mg total) by mouth 3 (three) times daily with meals. 90 tablet 1  . STOOL SOFTENER 100 MG capsule TAKE ONE CAPSULE BY MOUTH TWICE A DAY 60 capsule 2  . traZODone (DESYREL) 50 MG tablet Take 1 tablet (50 mg total) by mouth at bedtime. 90 tablet 1   No current facility-administered medications on file prior to visit.     Allergies:  No Known Allergies  Depression screen [facility] 2/9 06/26/2018  Decreased Interest 0  Down, Depressed, Hopeless 2  PHQ - 2 Score 2  Altered sleeping 0  Tired, decreased energy 0  Change in appetite 0  Feeling bad or failure about yourself  1  Trouble concentrating 0  Moving slowly or fidgety/restless 0  Suicidal thoughts 0  PHQ-9 Score 3  Difficult doing work/chores -   GAD 7 :  Generalized Anxiety Score 06/26/2018  Nervous, Anxious, on Edge 0  Control/stop worrying 3  Worry too much - different things 3  Trouble relaxing 0  Restless 0  Easily annoyed or irritable 0  Afraid - awful might happen 0  Total GAD 7 Score 6     Physical Exam General: Pleasant middle-aged Hispanic male, seated, in no evident distress Head: head normocephalic and atraumatic.     Neurologic Exam Mental Status: Awake and fully alert.  Mild dysarthria.  Oriented to place and time. Recent and remote memory diminished.  Attention span, concentration and fund of knowledge mainly appropriate but did rely on wife to answer some questions. Mood and affect overall appropriate but inappropriate at times (see note above) Cranial Nerves:  Extraocular movements full without nystagmus but has exotropia of the left eye.  Hearing intact to voice. Facial sensation intact. Face, tongue, palate moves normally and symmetrically.  Shoulder shrug symmetric. Motor: Mild LUE weakness with decreased finger tapping and orbiting of right arm over left arm.  Very mild left upper extremity drift.  Very mild left lower extremity drift and decreased left toe tapping. Sensory.: intact to light touch Coordination: Rapid alternating movements decreased LUE and LLE.  Finger-to-nose and heel-to-shin performed accurately bilaterally but slower on the left side. Gait and Station: Arises from chair without difficulty. Stance is slightly broad-based. Gait demonstrates dragging of the left leg with slight circumduction and assistance of rolling walker. Unable to walk tandem Reflexes: UTA     ASSESSMENT: 53 year old male with slurred speech, gait difficulty and left-sided weakness from lacunar brainstem and  Bilateral subcortical infarcts likely from May 2019 due to small vessel disease. Vascular risk factors of hypertension, diabetes, hyperlipidemia , coronary artery disease,and smoking.  Stable from stroke standpoint with  residual left hemiparesis, mild dysarthria, cognitive deficits and post stroke depression.     PLAN: -Referral placed to neuropsychology for further evaluation of cognition and depression/anxiety which has worsened since his stroke.  Unable to adequately evaluate if lack of motivation for independence and improvement can be contributed to depression versus cognition -Increase Zoloft dose to 100 mg daily -previously prescribed at hospital discharge without recent dosage adjustments -Referral placed for home health PT/OT/ST due to ongoing deficits with patient agreeing to participate in therapy sessions with long discussion of likely deconditioning if he continues to remain inactive severely limiting his functioning -Long discussion regarding importance of attempts to stay continent and to only use brief for emergency uses.  Recommended use of urinal and to increase daily activity due to complaints of constipation.  Incontinence likely related to depression/anxiety and cognition and less likely related to medical condition that would warrant further work-up at this time -Continue aspirin and plavix for secondary stroke prevention given h/o cardiac stents  -Continue atorvastatin 10 mg daily and ongoing follow-up with PCP -maintain strict control of hypertension with blood pressure goal below 130/90, diabetes with hemoglobin A1c goal below 6.5% and lipids with LDL cholesterol goal below 70 mg/dL. I also advised the patient to eat a healthy diet with plenty of whole grains, cereals, fruits and vegetables, exercise regularly and maintain ideal body weight    Follow-up in 3 months or call earlier if needed  Greater than 50% of time during this 60 minute non-face-to-face visit was spent on discussion of post stroke depression/anxiety and ongoing cognitive deficits, discussion regarding residual left hemiparesis with possible deconditioning, importance of staying active, reviewing risk factor management of  HTN and HLD, planning of further management, discussion with patient and family and coordination of care  Venancio Poisson, AGNP-BC  [facility] [address] [place], [place] [address]  Phone [phone] Fax [phone] Note: This document was prepared with digital dictation and possible smart phrase technology. Any transcriptional errors that result from this process are unintentional.

## 2018-06-30 ENCOUNTER — Encounter: Payer: Self-pay | Admitting: Gastroenterology

## 2018-07-01 ENCOUNTER — Encounter: Payer: Self-pay | Admitting: Adult Health

## 2018-07-03 ENCOUNTER — Telehealth: Payer: Self-pay | Admitting: Adult Health

## 2018-07-03 NOTE — Telephone Encounter (Signed)
Patient denied Home Health Referral .

## 2018-07-07 ENCOUNTER — Telehealth: Payer: Self-pay | Admitting: Family Medicine

## 2018-07-07 NOTE — Telephone Encounter (Signed)
Spoke with patient wife. They spoke to you about filling out a disability form at last visit. Dr Carlota Raspberry portion is the only portion left to be filled out. Patient will drop this form off tomorrow and patient need form filled out and faxed asap

## 2018-07-07 NOTE — Progress Notes (Signed)
I agree with the above plan 

## 2018-07-07 NOTE — Telephone Encounter (Signed)
Copied from [place] [id number]. Topic: General - Other >> Jul 07, 2018  1:33 PM Antonieta Iba C wrote: Reason for CRM: Mrs Julia called in to request a call back from Dr. Carlota Raspberry assistant. She said that in pt visit they discussed getting disability paperwork completed. She would like a call back to discuss how to go about things.    CB: [phone] Marland Kitchen Rennie Plowman

## 2018-07-08 ENCOUNTER — Other Ambulatory Visit: Payer: Self-pay | Admitting: Cardiology

## 2018-07-08 DIAGNOSIS — Z8673 Personal history of transient ischemic attack (TIA), and cerebral infarction without residual deficits: Secondary | ICD-10-CM

## 2018-07-09 NOTE — Telephone Encounter (Signed)
Form appears to have been completed by nephrologist, but I signed as well.  Form given to my assistant.

## 2018-07-14 ENCOUNTER — Encounter: Payer: Self-pay | Admitting: Family Medicine

## 2018-08-05 ENCOUNTER — Other Ambulatory Visit: Payer: Self-pay | Admitting: Family Medicine

## 2018-08-05 DIAGNOSIS — I509 Heart failure, unspecified: Secondary | ICD-10-CM

## 2018-08-18 NOTE — Telephone Encounter (Signed)
Patient wife called and said that she needs additional notes, labs and images from office and Dr. Carlota Raspberry about additional clinical work that its required for his approval for his long term disability. She would like a call back [phone]. Please call back as soon as possible because its a urgent request.

## 2018-08-23 NOTE — Telephone Encounter (Signed)
Left message on voicemail to return office call. Dgaddy, CMA 

## 2018-08-29 ENCOUNTER — Other Ambulatory Visit: Payer: Self-pay | Admitting: Family Medicine

## 2018-08-29 NOTE — Telephone Encounter (Signed)
Requested medications are due for refill today?  yes  Requested medications are on the active medication list?  No  Last refill 04/25/2018  Future visit scheduled?  No  Notes to clinic   Requested Prescriptions  Pending Prescriptions Disp Refills   Lancets (ONETOUCH DELICA PLUS [id number]) [place] [Pharmacy Med Name: ONETOUCH DELICA PLUS [id number] LANCT] 100 each 1    Sig: USE ONELANCET TO TEST - FOUR TIMES A DAY     There is no refill protocol information for this order

## 2018-09-30 ENCOUNTER — Ambulatory Visit: Payer: BC Managed Care – PPO | Admitting: Adult Health

## 2018-10-05 ENCOUNTER — Other Ambulatory Visit: Payer: Self-pay | Admitting: Family Medicine

## 2018-10-07 ENCOUNTER — Ambulatory Visit: Payer: BC Managed Care – PPO | Admitting: Adult Health

## 2018-10-07 ENCOUNTER — Other Ambulatory Visit: Payer: Self-pay

## 2018-10-07 ENCOUNTER — Encounter: Payer: Self-pay | Admitting: Adult Health

## 2018-10-07 VITALS — BP 120/68 | HR 63 | Temp 98.6°F | Ht 65.0 in | Wt 121.4 lb

## 2018-10-07 DIAGNOSIS — E785 Hyperlipidemia, unspecified: Secondary | ICD-10-CM

## 2018-10-07 DIAGNOSIS — I69354 Hemiplegia and hemiparesis following cerebral infarction affecting left non-dominant side: Secondary | ICD-10-CM

## 2018-10-07 DIAGNOSIS — R4189 Other symptoms and signs involving cognitive functions and awareness: Secondary | ICD-10-CM

## 2018-10-07 DIAGNOSIS — E1121 Type 2 diabetes mellitus with diabetic nephropathy: Secondary | ICD-10-CM

## 2018-10-07 DIAGNOSIS — I6381 Other cerebral infarction due to occlusion or stenosis of small artery: Secondary | ICD-10-CM

## 2018-10-07 DIAGNOSIS — I69322 Dysarthria following cerebral infarction: Secondary | ICD-10-CM

## 2018-10-07 DIAGNOSIS — F0631 Mood disorder due to known physiological condition with depressive features: Secondary | ICD-10-CM

## 2018-10-07 DIAGNOSIS — R262 Difficulty in walking, not elsewhere classified: Secondary | ICD-10-CM

## 2018-10-07 DIAGNOSIS — I69398 Other sequelae of cerebral infarction: Secondary | ICD-10-CM

## 2018-10-07 DIAGNOSIS — I10 Essential (primary) hypertension: Secondary | ICD-10-CM

## 2018-10-07 NOTE — Patient Instructions (Signed)
Referral placed to outpatient physical, occupational and speech therapy -it is imperative that you participate in therapies if you want to see improvement of your walking.  If you do not participate in therapies or do exercises as recommended at home, you will continue to see worsening due to deconditioning of your muscles  Continue Zoloft 100 mg daily -if any concern with ongoing depression or anxiety, please follow-up with PCP for potential need of increasing  Continue aspirin 81 mg daily  and Lipitor for secondary stroke prevention  Continue to follow up with PCP regarding cholesterol, blood pressure and diabetes management   Continue to monitor blood pressure at home  Maintain strict control of hypertension with blood pressure goal below 130/90, diabetes with hemoglobin A1c goal below 6.5% and cholesterol with LDL cholesterol (bad cholesterol) goal below 70 mg/dL. I also advised the patient to eat a healthy diet with plenty of whole grains, cereals, fruits and vegetables, exercise regularly and maintain ideal body weight.  Followup in the future with me in 4 months or call earlier if needed       Thank you for coming to see [place] at [facility]. I hope we have been able to provide you high quality care today.  You may receive a patient satisfaction survey over the next few weeks. We would appreciate your feedback and comments so that we may continue to improve ourselves and the health of our patients.

## 2018-10-07 NOTE — Progress Notes (Signed)
I agree with the above plan 

## 2018-10-07 NOTE — Progress Notes (Signed)
[facility] [address] [place]. [place] [address] [phone]       FOLLOW UP NOTE  Mr. Buchanan Housen Date of Birth:  [birth year] Medical Record Number:  [id number]   Referring MD:  Jamison Neighbor Reason for visit: Stroke     HPI:   Initial visit 10/15/2017 : Bailey Waldecker is a 53 y.o. with PMH of COPD, CAD s/p stents, ESRD on HD, HTN, depression, chronic combined systolic and diastolic heart failure, prior tobacco use, HLD and DM.  He complained of slurred speech, gait and balance difficulties and underwent CT head on 07/04/2017 which showed small right pontine and bilateral basal ganglia lacunar infarcts with mild degree of generalized cerebral atrophy.  He was initially evaluated in our office by Dr Leonie Man on 10/15/2017 with residual gait difficulty with use of rolling walker.  No further work-up has been done prior to initial office visit.  Recommended to complete stroke work-up with MRI brain on 11/20/2017 which showed remote H bilateral basal ganglia, thalamic and brainstem infarcts.  MRA of neck unremarkable.  MRA head showed diffuse intracranial arthrosclerotic changes in anterior circulation.  2D echo unremarkable.  Cardiac event monitoring negative for A. Fib.  At follow-up visit on 12/26/2017 he continues to have residual gait difficulties and new complaints of memory difficulties which she has been experiencing since her stroke.  Virtual update 06/26/2018: During today's visit, he continued to have gait difficulties and cognitive deficits.  He currently lives with his wife (currently separated) and adult children.  Prior to his stroke, he was living independently and working full-time but moved back in after his stroke as he needed assistance.  Wife endorses frustration with his lack of motivation to improve and states that he will only ambulate to the bathroom but then will sit back on the couch where he spends most of the day.  He was previously receiving home therapy  but due to his lack of participation, he was discharged from services.  Over the past few months, he has become more incontinent of urine and occasionally stool.  Patient states he was previously having difficulty getting to the bathroom in time and would end up urinating prior to getting to the bathroom and therefore they started placing him in briefs.  Since he has been in briefs, he has become more incontinent and wife is unsure if this is due to "laziness".  He is aware of when he needs to void and is unable to state whether incontinence is due to "laziness" or urgency sensation.  He will occasionally be incontinent of stool but wife endorses constipation.  He uses a wheelchair for long distance.  He denies any falls with rolling walker.  He states his memory has been stable in regards to his short-term memory which wife agrees with but also believes he has long-term memory issues such as not remembering important events from prior years.  His cognitive deficit has been stable without any worsening but denies improvement.  He continues on aspirin and Plavix and atorvastatin 10 mg daily without reported side effects.  He denies new or worsening stroke/TIA symptoms. Further evaluation regarding cognition and possible underlying depression which could be relating to his lack of motivation and willingness to care for himself, incontinence and motivation.  He does sleep well throughout the night and will only nap occasionally.  Denies snoring.  He does undergo HD 3 times per week.  PHQ 9 and GAD 7 listed below but question accuracy due to variation in  answers.  Per wife, questionable underlying depression prior to his stroke after he and his wife became separated.  She has been told by his daughter that he was "drinking a lot and stayed in a dark room".  While wife was initially speaking about her frustrations and is even currently looking into a long-term care facility due to his consistent daily care needs, he was  laughing and smirking.  When she started speaking of their separation and potential underlying depression, he became tearful and very upset.  Her wife, he does not have family outside of herself and his children therefore his care relies mainly on them.  His wife feels as though he has just "given up hope" and has no desire to become independent with his daily functioning.  Attempted to question further but unable to adequately assess.  Update 10/07/2018: Mr. Decrane is a 53 year old male who is being seen today for stroke follow-up accompanied by his son.  Residual deficits of left hemiparesis, mild dysarthria, gait difficulties and decreased cognition.  Patient is frustrated regarding lack of improvement as he is now nonambulatory.  Referral for home health placed at prior visit which he was agreeable to but apparently refused when they called to schedule visits.  He spends his day in a wheelchair or on the couch without any activity and decreased function is likely secondary to deconditioning.  Discussion regarding need of regular exercise and therapy in order to see improvement as he will likely continue to see worsening with current activity level.  He continues to live with his son and daughter along with his ex-wife.  Son does provide history regarding ongoing medication use as patient unable to verify.  He also states he does not remember declining home health therapy but after discussion with replacing referral for home health versus outpatient therapy, he declines interest initially.  He has continued on aspirin and Plavix without bleeding or bruising.  Continues on atorvastatin without myalgias.  Blood pressure stable at 120/68.  Zoloft dosage increased at prior visit due to depression and anxiety without ongoing concerns.  No further concerns at this time.      ROS:   14 system review of systems is positive for memory loss, numbness, speech difficulties and tremors and all other systems  negative  PMH:  Past Medical History:  Diagnosis Date   Chest pain 01/10/2016   Chronic combined systolic and diastolic heart failure ([place])    Coronary artery disease    a.12/22/15: NSTEMI s/p overlapping DES x2 and balloon angioplasty to distal AV groove Circumflex (too small for a stent).    Coronary artery disease due to lipid rich plaque 01/10/2016   Dehydration 08/22/2012   Depression    Elevated troponin    ESRD (end stage renal disease) on dialysis [facility])    Essential hypertension    History of completed stroke 05/28/2017   Hypercholesteremia    Hypertension    Hypertensive heart disease with heart failure ([place])    Ischemic cardiomyopathy 03/13/2016   Normocytic anemia 05/28/2017   NSTEMI (non-ST elevated myocardial infarction) ([place]) 12/22/2015   Prostate infection    Status post coronary artery stent placement    Syncope 03/13/2016   Tobacco abuse    Type 2 diabetes mellitus with diabetic nephropathy, without long-term current use of insulin ([facility])    Type II diabetes mellitus ([place])    Uncontrolled hypertension 05/28/2017   Unstable angina pectoris ([place]) 01/11/2016    Social History:  Social History   Socioeconomic  History   Marital status: Married    Spouse name: Not on file   Number of children: Not on file   Years of education: Not on file   Highest education level: Not on file  Occupational History   Occupation: CUSTODIAN    Employer: [employer] resource strain: Not on file   Food insecurity    Worry: Not on file    Inability: Not on file   Transportation needs    Medical: Not on file    Non-medical: Not on file  Tobacco Use   Smoking status: Former Smoker    Packs/day: 0.75    Years: 29.00    Pack years: 21.75    Types: Cigarettes    Quit date: 05/28/2017    Years since quitting: 1.3   Smokeless tobacco: Never Used  Substance and Sexual Activity   Alcohol use: Not Currently     Alcohol/week: 7.0 standard drinks    Types: 7 Cans of beer per week    Frequency: Never    Comment: last drink was prior to last hospitalization   Drug use: No   Sexual activity: Not Currently  Lifestyle   Physical activity    Days per week: Not on file    Minutes per session: Not on file   Stress: Not on file  Relationships   Social connections    Talks on phone: Not on file    Gets together: Not on file    Attends religious service: Not on file    Active member of club or organization: Not on file    Attends meetings of clubs or organizations: Not on file    Relationship status: Not on file   Intimate partner violence    Fear of current or ex partner: Not on file    Emotionally abused: Not on file    Physically abused: Not on file    Forced sexual activity: Not on file  Other Topics Concern   Not on file  Social History Narrative   Not on file    Medications:   Current Outpatient Medications on File Prior to Visit  Medication Sig Dispense Refill   aspirin 81 MG chewable tablet CHEW ONE TABLET BY MOUTH DAILY 90 tablet 0   atorvastatin (LIPITOR) 10 MG tablet Take 1 tablet (10 mg total) by mouth daily. 90 tablet 3   B Complex-C-Folic Acid (RENA-VITE RX) 1 MG TABS TAKE ONE TABLET BY MOUTH EVERY NIGHT AT BEDTIME 90 tablet 0   calcitRIOL (ROCALTROL) 0.5 MCG capsule TAKE 1 CAPSULE BY MOUTH ON MONDAY, WEDNESDAY, AND FRIDAY WITH HEMODIALYSIS 30 capsule 0   carvedilol (COREG) 6.25 MG tablet Take 1 tablet (6.25 mg total) by mouth 2 (two) times daily with a meal. 180 tablet 3   clopidogrel (PLAVIX) 75 MG tablet TAKE ONE TABLET BY MOUTH DAILY 90 tablet 1   glucose blood (ONETOUCH VERIO) test strip 1 each by Other route as needed for other. Use as instructed 1-2 times per day. 100 each 2   Lancets (ONETOUCH DELICA PLUS 123XX123) MISC USE ONELANCET TO TEST - FOUR TIMES A DAY 100 each 1   nitroGLYCERIN (NITROSTAT) 0.4 MG SL tablet Place 1 tablet (0.4 mg total) under the  tongue every 5 (five) minutes x 3 doses as needed for chest pain. 25 tablet 3   sertraline (ZOLOFT) 100 MG tablet Take 1 tablet (100 mg total) by mouth daily. 30 tablet 2  sevelamer carbonate (RENVELA) 800 MG tablet Take 1 tablet (800 mg total) by mouth 3 (three) times daily with meals. 90 tablet 1   STOOL SOFTENER 100 MG capsule TAKE ONE CAPSULE BY MOUTH TWICE A DAY 60 capsule 1   traZODone (DESYREL) 50 MG tablet Take 1 tablet (50 mg total) by mouth at bedtime. 90 tablet 1   No current facility-administered medications on file prior to visit.     Allergies:  No Known Allergies  Today's Vitals   10/07/18 0924  BP: 120/68  Pulse: 63  Temp: 98.6 F (37 C)  Weight: 121 lb 6.4 oz (55.1 kg)  Height: 5\' 5"  (1.651 m)   Body mass index is 20.2 kg/m.  Physical Exam  General: well developed, well nourished,  pleasant middle-aged male, seated, in no evident distress Head: head normocephalic and atraumatic.   Neck: supple with no carotid or supraclavicular bruits Cardiovascular: regular rate and rhythm, no murmurs Musculoskeletal: no deformity Skin:  no rash/petichiae Vascular:  Normal pulses all extremities   Neurologic Exam Mental Status: Awake and fully alert.   Mild dysarthria.  Oriented to place and time. Recent and remote memory diminished. Attention span, concentration and fund of knowledge diminished. Mood and affect appropriate.  Cranial Nerves: Pupils equal, briskly reactive to light. Extraocular movements full without nystagmus. Visual fields full to confrontation. Hearing intact. Facial sensation intact.  Left-sided facial weakness Motor:  LUE: 3+/5 with weak grip strength LLE: 4-/5 greatest in hip flexor and weak ankle dorsiflexion Full strength right upper and lower extremity Sensory.: intact to touch , pinprick , position and vibratory sensation.  Coordination: Rapid alternating diminished on left side. Finger-to-nose performed accurately bilaterally and heel-to-shin  performed accurately on right side but unable to perform on left side Gait and Station: Nonambulatory sitting in wheelchair Reflexes: 1+ right side and 2+ left side. Toes downgoing.       ASSESSMENT: 53 year old male with slurred speech, gait difficulty and left-sided weakness from lacunar brainstem and  Bilateral subcortical infarcts likely from May 2019 due to small vessel disease. Vascular risk factors of hypertension, diabetes, hyperlipidemia , coronary artery disease,and smoking.  Residual deficits of left hemiparesis, gait difficulty, dysarthria and cognitive impairment.  He is now nonambulatory with potential worsening of left-sided weakness likely due to deconditioning with lack of therapy and daytime activity.  Referral placed at prior visit from home health therapies but he apparently refused participation when they called to schedule initial visit     PLAN: -Long discussion with patient regarding participation in therapies as he is overly frustrated with residual deficits and decreasing function.  Family declined home health therapy at this visit due to COVID-19 but will provide transportation to participate in outpatient PT/OT/ST -orders placed to neuro rehab  -Continue Zoloft 100 mg daily and recommend follow-up with PCP for ongoing monitoring and management -Continue aspirin and plavix for secondary stroke prevention given h/o cardiac stents  -Continue atorvastatin 10 mg daily and ongoing follow-up with PCP -maintain strict control of hypertension with blood pressure goal below 130/90, diabetes with hemoglobin A1c goal below 6.5% and lipids with LDL cholesterol goal below 70 mg/dL. I also advised the patient to eat a healthy diet with plenty of whole grains, cereals, fruits and vegetables, exercise regularly and maintain ideal body weight    Follow-up in 4 months or call earlier if needed  Greater than 50% of time during this 25-minute visit was spent on discussion of post stroke  depression/anxiety and ongoing cognitive deficits, highly  stressed the importance of participation in therapies as he desires improvement, discussion regarding residual left hemiparesis with possible deconditioning, importance of staying active, reviewing risk factor management of HTN and HLD, planning of further management, discussion with patient and family and coordination of care    Frann Rider [facility]), AGNP-BC  [facility] [address] [place], [place] [address]  Phone [phone] Fax [phone] Note: This document was prepared with digital dictation and possible smart phrase technology. Any transcriptional errors that result from this process are unintentional.

## 2018-10-16 ENCOUNTER — Encounter: Payer: Self-pay | Admitting: Family Medicine

## 2018-10-16 ENCOUNTER — Other Ambulatory Visit: Payer: Self-pay

## 2018-10-16 ENCOUNTER — Ambulatory Visit: Payer: BC Managed Care – PPO | Admitting: Family Medicine

## 2018-10-16 VITALS — BP 128/68 | HR 63 | Temp 99.0°F | Resp 16

## 2018-10-16 DIAGNOSIS — Z992 Dependence on renal dialysis: Secondary | ICD-10-CM

## 2018-10-16 DIAGNOSIS — I10 Essential (primary) hypertension: Secondary | ICD-10-CM | POA: Diagnosis not present

## 2018-10-16 DIAGNOSIS — I6930 Unspecified sequelae of cerebral infarction: Secondary | ICD-10-CM

## 2018-10-16 DIAGNOSIS — E1122 Type 2 diabetes mellitus with diabetic chronic kidney disease: Secondary | ICD-10-CM

## 2018-10-16 DIAGNOSIS — F32A Depression, unspecified: Secondary | ICD-10-CM

## 2018-10-16 DIAGNOSIS — F329 Major depressive disorder, single episode, unspecified: Secondary | ICD-10-CM

## 2018-10-16 DIAGNOSIS — N186 End stage renal disease: Secondary | ICD-10-CM

## 2018-10-16 DIAGNOSIS — E7800 Pure hypercholesterolemia, unspecified: Secondary | ICD-10-CM

## 2018-10-16 NOTE — Patient Instructions (Addendum)
  Continue zoloft same dose for now, as I expect the physical/occupational therapy at home to help. Counseling can also help - here is a number. Follow up in 6 weeks for telemed visit for depression symptoms.  [facility]: [phone]  I will review form to complete.  Return to the clinic or go to the nearest emergency room if any of your symptoms worsen or new symptoms occur.    If you have lab work done today you will be contacted with your lab results within the next 2 weeks.  If you have not heard from [place] then please contact us. The fastest way to get your results is to register for My Chart.   IF you received an x-ray today, you will receive an invoice from [facility]. Please contact [facility] at [phone] with questions or concerns regarding your invoice.   IF you received labwork today, you will receive an invoice from [place] [facility]. Please contact [facility] at [phone] with questions or concerns regarding your invoice.   Our billing staff will not be able to assist you with questions regarding bills from these companies.  You will be contacted with the lab results as soon as they are available. The fastest way to get your results is to activate your My Chart account. Instructions are located on the last page of this paperwork. If you have not heard from [place] regarding the results in 2 weeks, please contact this office.

## 2018-10-16 NOTE — Progress Notes (Signed)
Subjective:    Patient ID: Jeffery Young, male    DOB: 03-04-1965, 53 y.o.   MRN: [id number]  HPI Jeffery Young is a 53 y.o. male Presents today for: Chief Complaint  Patient presents with  . Follow-up    here for 6 month f/u diabetes/labs and get Adult home care forms filled out   Diabetes: Complicated by diabetic nephropathy.  History of CVA Diet-controlled diabetes.  No home readings.   Optho, foot exam, pneumovax: Thinks had pneumovax and flu vaccine at dialysis.    Lab Results  Component Value Date   HGBA1C 6.7 (H) 03/06/2018   HGBA1C 6.8 (H) 09/05/2017   HGBA1C 6.9 (H) 05/29/2017   Lab Results  Component Value Date   LDLCALC 62 04/24/2018   CREATININE 4.54 (H) 01/26/2018   History of CVA  Residual left hemiparesis, mild dysarthria, gait difficulties and decreased cognition.  Neurology appointment September 1.  less activity, primarily in wheelchair or on the couch.  Continue on aspirin and Plavix.  Continue home atorvastatin, blood pressure 120/68.  Referral placed for outpatient physical therapy, Occupational Therapy, speech therapy.  Continue on Zoloft 100 mg daily for depression symptoms.  Depression: Stress with not working and being home all day.  Still on Zoloft 100mg  QD (increased from 50mg   Last few months more down.  No SI. Sleeping ok with trazodone.  No counseing in past.   Depression screen [facility] 2/9 10/16/2018 06/26/2018 06/24/2018 04/24/2018 03/11/2018  Decreased Interest 0 0 0 0 0  Down, Depressed, Hopeless 0 2 0 0 0  PHQ - 2 Score 0 2 0 0 0  Altered sleeping - 0 - - -  Tired, decreased energy - 0 - - -  Change in appetite - 0 - - -  Feeling bad or failure about yourself  - 1 - - -  Trouble concentrating - 0 - - -  Moving slowly or fidgety/restless - 0 - - -  Suicidal thoughts - 0 - - -  PHQ-9 Score - 3 - - -  Difficult doing work/chores - - - - -      Hypertension: BP Readings from Last 3 Encounters:  10/16/18 (!) 152/73  10/07/18 120/68   04/24/18 118/62   Lab Results  Component Value Date   CREATININE 4.54 (H) [id number]  Complicated by end-stage renal disease on hemodialysis MWF.  BP previously controlled, including at neurology visit 9 days ago carvedilol 6.25 mg twice daily, Telemedicine visit with cardiology May 5.  No changes, recheck in 6 months.   Living with ex-wife.  Has FL2 form to complete.- possibly plan for skilled nursing facility.- spoke to ex-wife - plan to go to same facility as prior as no new outpatients d/t Covid, but now planning on restarting process. Had prior FL2 completed in March.   Patient Active Problem List   Diagnosis Date Noted  . COPD GOLD III  10/24/2017  . CAD (coronary artery disease), native coronary artery 10/10/2017  . ESRD (end stage renal disease) on dialysis ([place]) 06/25/2017  . Uncontrolled hypertension 05/28/2017  . Depression 05/28/2017  . Normocytic anemia 05/28/2017  . History of completed stroke 05/28/2017  . Syncope 03/13/2016  . Ischemic cardiomyopathy 03/13/2016  . Unstable angina pectoris ([place]) 01/11/2016  . Hypertensive heart disease with heart failure ([place])   . Chronic combined systolic and diastolic heart failure ([place])   . Elevated troponin   . Chest pain 01/10/2016  . Coronary artery disease due to lipid rich plaque 01/10/2016  .  Status post coronary artery stent placement   . Type 2 diabetes mellitus with diabetic nephropathy, without long-term current use of insulin ([place])   . Tobacco abuse   . Essential hypertension   . Hypercholesteremia   . NSTEMI (non-ST elevated myocardial infarction) ([place]) 12/22/2015  . Lightheadedness 08/22/2012  . Dehydration 08/22/2012   Past Medical History:  Diagnosis Date  . Chest pain 01/10/2016  . Chronic combined systolic and diastolic heart failure ([place])   . Coronary artery disease    a.12/22/15: NSTEMI s/p overlapping DES x2 and balloon angioplasty to distal AV groove Circumflex (too small for a stent).   . Coronary  artery disease due to lipid rich plaque 01/10/2016  . Dehydration 08/22/2012  . Depression   . Elevated troponin   . ESRD (end stage renal disease) on dialysis ([place])   . Essential hypertension   . History of completed stroke 05/28/2017  . Hypercholesteremia   . Hypertension   . Hypertensive heart disease with heart failure ([place])   . Ischemic cardiomyopathy 03/13/2016  . Normocytic anemia 05/28/2017  . NSTEMI (non-ST elevated myocardial infarction) ([place]) 12/22/2015  . Prostate infection   . Status post coronary artery stent placement   . Syncope 03/13/2016  . Tobacco abuse   . Type 2 diabetes mellitus with diabetic nephropathy, without long-term current use of insulin ([place])   . Type II diabetes mellitus ([place])   . Uncontrolled hypertension 05/28/2017  . Unstable angina pectoris ([place]) 01/11/2016   Past Surgical History:  Procedure Laterality Date  . BASCILIC VEIN TRANSPOSITION Left 06/28/2017   Procedure: LEFT UPPER ARM ARTERIOVENOUS GORTEX-GRAFT PLACEMENT;  Surgeon: Angelia Mould, MD;  Location: [place];  Service: Vascular;  Laterality: Left;  . CARDIAC CATHETERIZATION N/A 12/22/2015   Procedure: Left Heart Cath and Coronary Angiography;  Surgeon: Burnell Blanks, MD;  Location: [place] [facility];  Service: Cardiovascular;  Laterality: N/A;  . CARDIAC CATHETERIZATION N/A 12/22/2015   Procedure: Coronary Stent Intervention;  Surgeon: Burnell Blanks, MD;  Location: [place] [facility];  Service: Cardiovascular;  Laterality: N/A;  . CARDIAC CATHETERIZATION N/A 01/11/2016   Procedure: Left Heart Cath and Coronary Angiography;  Surgeon: Sherren Mocha, MD;  Location: [place] [facility];  Service: Cardiovascular;  Laterality: N/A;  . CARDIAC CATHETERIZATION N/A 01/11/2016   Procedure: Intravascular Pressure Wire/FFR Study;  Surgeon: Sherren Mocha, MD;  Location: [place] [facility];  Service: Cardiovascular;  Laterality: N/A;  . CARDIAC CATHETERIZATION N/A 01/11/2016   Procedure:  Coronary Stent Intervention;  Surgeon: Sherren Mocha, MD;  Location: [place] [facility];  Service: Cardiovascular;  Laterality: N/A;  . CORONARY ANGIOPLASTY WITH STENT PLACEMENT  12/22/2015  . INSERTION OF DIALYSIS CATHETER Right 06/28/2017   Procedure: INSERTION OF DIALYSIS CATHETER RIGHT INTERNAL JUGULAR;  Surgeon: Angelia Mould, MD;  Location: [facility];  Service: Vascular;  Laterality: Right;   No Known Allergies Prior to Admission medications   Medication Sig Start Date End Date Taking? Authorizing Provider  aspirin 81 MG chewable tablet CHEW ONE TABLET BY MOUTH DAILY 08/05/18  Yes [name], MD  atorvastatin (LIPITOR) 10 MG tablet Take 1 tablet (10 mg total) by mouth daily. 06/10/18  Yes Imogene Burn, PA-C  B Complex-C-Folic Acid (RENA-VITE RX) 1 MG TABS TAKE ONE TABLET BY MOUTH EVERY NIGHT AT BEDTIME 06/24/18  Yes [name], MD  calcitRIOL (ROCALTROL) 0.5 MCG capsule TAKE 1 CAPSULE BY MOUTH ON MONDAY, WEDNESDAY, AND FRIDAY WITH HEMODIALYSIS 03/17/18  Yes Merri Ray  R, MD  carvedilol (COREG) 6.25 MG tablet Take 1 tablet (6.25 mg total) by mouth 2 (two) times daily with a meal. 06/10/18  Yes Imogene Burn, PA-C  clopidogrel (PLAVIX) 75 MG tablet TAKE ONE TABLET BY MOUTH DAILY 07/08/18  Yes Burnell Blanks, MD  glucose blood (ONETOUCH VERIO) test strip 1 each by Other route as needed for other. Use as instructed 1-2 times per day. 04/24/18  Yes [name], MD  Lancets [facility] DELICA PLUS [id number]) MISC USE ONELANCET TO TEST - FOUR TIMES A DAY 08/29/18  Yes [name], MD  nitroGLYCERIN (NITROSTAT) 0.4 MG SL tablet Place 1 tablet (0.4 mg total) under the tongue every 5 (five) minutes x 3 doses as needed for chest pain. 12/26/15  Yes Reino Bellis B, NP  sertraline (ZOLOFT) 100 MG tablet Take 1 tablet (100 mg total) by mouth daily. 06/26/18  Yes Frann Rider, NP  sevelamer carbonate (RENVELA) 800 MG tablet Take 1 tablet (800 mg total) by mouth 3  (three) times daily with meals. 08/15/17  Yes [name], MD  STOOL SOFTENER 100 MG capsule TAKE ONE CAPSULE BY MOUTH TWICE A DAY 10/06/18  Yes [name], MD  traZODone (DESYREL) 50 MG tablet Take 1 tablet (50 mg total) by mouth at bedtime. 04/24/18  Yes [name], MD   Social History   Socioeconomic History  . Marital status: Married    Spouse name: Not on file  . Number of children: Not on file  . Years of education: Not on file  . Highest education level: Not on file  Occupational History  . Occupation: International aid/development worker: [employer]  Social Needs  . Financial resource strain: Not on file  . Food insecurity    Worry: Not on file    Inability: Not on file  . Transportation needs    Medical: Not on file    Non-medical: Not on file  Tobacco Use  . Smoking status: Former Smoker    Packs/day: 0.75    Years: 29.00    Pack years: 21.75    Types: Cigarettes    Quit date: 05/28/2017    Years since quitting: 1.3  . Smokeless tobacco: Never Used  Substance and Sexual Activity  . Alcohol use: Not Currently    Alcohol/week: 7.0 standard drinks    Types: 7 Cans of beer per week    Frequency: Never    Comment: last drink was prior to last hospitalization  . Drug use: No  . Sexual activity: Not Currently  Lifestyle  . Physical activity    Days per week: Not on file    Minutes per session: Not on file  . Stress: Not on file  Relationships  . Social Herbalist on phone: Not on file    Gets together: Not on file    Attends religious service: Not on file    Active member of club or organization: Not on file    Attends meetings of clubs or organizations: Not on file    Relationship status: Not on file  . Intimate partner violence    Fear of current or ex partner: Not on file    Emotionally abused: Not on file    Physically abused: Not on file    Forced sexual activity: Not on file  Other Topics Concern  . Not on file  Social History  Narrative  . Not on file  Review of Systems Per HPI.     Objective:   Physical Exam Vitals signs reviewed.  Constitutional:      Appearance: He is well-developed.  HENT:     Head: Normocephalic and atraumatic.  Eyes:     Pupils: Pupils are equal, round, and reactive to light.  Neck:     Vascular: No carotid bruit or JVD.  Cardiovascular:     Rate and Rhythm: Normal rate and regular rhythm.     Heart sounds: Normal heart sounds. No murmur.  Pulmonary:     Effort: Pulmonary effort is normal.     Breath sounds: Normal breath sounds. No rales.  Skin:    General: Skin is warm and dry.  Neurological:     Mental Status: He is alert and oriented to person, place, and time.  Psychiatric:        Attention and Perception: Attention normal.        Mood and Affect: Affect is flat.        Speech: Speech normal.        Behavior: Behavior normal.        Thought Content: Thought content normal.    Vitals:   10/16/18 1337 10/16/18 1421  BP: (!) 152/73 128/68  Pulse: 63   Resp: 16   Temp: 99 F (37.2 C)   TempSrc: Oral   SpO2: 97%        Assessment & Plan:  Jeffery Young is a 53 y.o. male Type 2 diabetes mellitus with chronic kidney disease on chronic dialysis, without long-term current use of insulin ([place]) - Plan: Lipid Panel, Comprehensive metabolic panel, Hemoglobin A1c  -Previously diet-controlled, check labs.  Essential hypertension - Plan: Comprehensive metabolic panel  -Stable on recheck, no changes.  History of CVA with residual deficit - Plan: Lipid Panel  -Therapy ordered as above.  Anticipate that will help with mood as well.  Continue same dose of sertraline for now.  Counseling may also be helpful, family planning  -FL 2 will be completed for anticipation of possible skilled nursing facility.  Hypercholesteremia - Plan: Lipid Panel   No orders of the defined types were placed in this encounter.  Patient Instructions    Continue zoloft same dose for now,  as I expect the physical/occupational therapy at home to help. Counseling can also help - here is a number. Follow up in 6 weeks for telemed visit for depression symptoms.  [facility]: [phone]  I will review form to complete.  Return to the clinic or go to the nearest emergency room if any of your symptoms worsen or new symptoms occur.    If you have lab work done today you will be contacted with your lab results within the next 2 weeks.  If you have not heard from [place] then please contact us. The fastest way to get your results is to register for My Chart.   IF you received an x-ray today, you will receive an invoice from [facility]. Please contact [facility] at [phone] with questions or concerns regarding your invoice.   IF you received labwork today, you will receive an invoice from [facility]. Please contact [facility] at [phone] with questions or concerns regarding your invoice.   Our billing staff will not be able to assist you with questions regarding bills from these companies.  You will be contacted with the lab results as soon as they are available. The fastest way to get your results is to activate your My  Chart account. Instructions are located on the last page of this paperwork. If you have not heard from [place] regarding the results in 2 weeks, please contact this office.       Signed,   Merri Ray, MD [facility] [place].  10/18/18 12:33 PM

## 2018-10-17 LAB — LIPID PANEL
Chol/HDL Ratio: 3.2 ratio (ref 0.0–5.0)
Cholesterol, Total: 149 mg/dL (ref 100–199)
HDL: 47 mg/dL (ref >39)
LDL Chol Calc (NIH): 70 mg/dL (ref 0–99)
Triglycerides: 194 mg/dL — ABNORMAL HIGH (ref 0–149)
VLDL Cholesterol Cal: 32 mg/dL (ref 5–40)

## 2018-10-17 LAB — HEMOGLOBIN A1C
Est. average glucose Bld gHb Est-mCnc: 137 mg/dL
Hgb A1c MFr Bld: 6.4 % — ABNORMAL HIGH (ref 4.8–5.6)

## 2018-10-17 LAB — COMPREHENSIVE METABOLIC PANEL
ALT: 16 IU/L (ref 0–44)
AST: 19 IU/L (ref 0–40)
Albumin/Globulin Ratio: 1.4 (ref 1.2–2.2)
Albumin: 4.5 g/dL (ref 3.8–4.9)
Alkaline Phosphatase: 142 IU/L — ABNORMAL HIGH (ref 39–117)
BUN/Creatinine Ratio: 9 (ref 9–20)
BUN: 53 mg/dL — ABNORMAL HIGH (ref 6–24)
Bilirubin Total: <0.2 mg/dL (ref 0.0–1.2)
CO2: 27 mmol/L (ref 20–29)
Calcium: 8.8 mg/dL (ref 8.7–10.2)
Chloride: 91 mmol/L — ABNORMAL LOW (ref 96–106)
Creatinine, Ser: 5.58 mg/dL (ref 0.76–1.27)
GFR calc Af Amer: 12 mL/min/{1.73_m2} — ABNORMAL LOW (ref >59)
GFR calc non Af Amer: 11 mL/min/{1.73_m2} — ABNORMAL LOW (ref >59)
Globulin, Total: 3.3 g/dL (ref 1.5–4.5)
Glucose: 95 mg/dL (ref 65–99)
Potassium: 5.1 mmol/L (ref 3.5–5.2)
Sodium: 136 mmol/L (ref 134–144)
Total Protein: 7.8 g/dL (ref 6.0–8.5)

## 2018-10-18 ENCOUNTER — Encounter: Payer: Self-pay | Admitting: Family Medicine

## 2018-10-27 ENCOUNTER — Other Ambulatory Visit: Payer: Self-pay | Admitting: Family Medicine

## 2018-10-27 DIAGNOSIS — F32A Depression, unspecified: Secondary | ICD-10-CM

## 2018-10-27 DIAGNOSIS — F329 Major depressive disorder, single episode, unspecified: Secondary | ICD-10-CM

## 2018-10-27 NOTE — Telephone Encounter (Signed)
Patient has appointment 10/22- refill until then.

## 2018-10-29 ENCOUNTER — Encounter

## 2018-10-31 ENCOUNTER — Other Ambulatory Visit: Payer: Self-pay | Admitting: Family Medicine

## 2018-10-31 DIAGNOSIS — I509 Heart failure, unspecified: Secondary | ICD-10-CM

## 2018-11-03 ENCOUNTER — Telehealth: Payer: Self-pay | Admitting: Family Medicine

## 2018-11-03 ENCOUNTER — Other Ambulatory Visit: Payer: Self-pay | Admitting: Adult Health

## 2018-11-03 DIAGNOSIS — F0631 Mood disorder due to known physiological condition with depressive features: Secondary | ICD-10-CM

## 2018-11-03 NOTE — Telephone Encounter (Signed)
Pt's wife called needing an update on pprwrk left at pt's last appt. Provider stated it would be completed in 1 week or so and pt's wife states this is week 3. Requesting cb from assistant wit update of sl2 pprwrk

## 2018-11-05 ENCOUNTER — Telehealth: Payer: Self-pay

## 2018-11-05 NOTE — Telephone Encounter (Signed)
Completed, given to [place] today.

## 2018-11-05 NOTE — Telephone Encounter (Signed)
Spoke with wife about picking up paper work today and it was placed at the front for pick-up. Paperwork was picked up today by the wife son.

## 2018-11-07 ENCOUNTER — Other Ambulatory Visit: Payer: Self-pay | Admitting: Adult Health

## 2018-11-07 DIAGNOSIS — F0631 Mood disorder due to known physiological condition with depressive features: Secondary | ICD-10-CM

## 2018-11-07 DIAGNOSIS — I69398 Other sequelae of cerebral infarction: Secondary | ICD-10-CM

## 2018-11-10 ENCOUNTER — Emergency Department (HOSPITAL_COMMUNITY): Payer: BC Managed Care – PPO

## 2018-11-10 ENCOUNTER — Other Ambulatory Visit: Payer: Self-pay | Admitting: Adult Health

## 2018-11-10 ENCOUNTER — Telehealth: Payer: Self-pay | Admitting: Adult Health

## 2018-11-10 ENCOUNTER — Emergency Department (HOSPITAL_COMMUNITY)
Admission: EM | Admit: 2018-11-10 | Discharge: 2018-11-10 | Disposition: A | Discharge status: Home / Self Care | Payer: BC Managed Care – PPO | Attending: Emergency Medicine | Admitting: Emergency Medicine

## 2018-11-10 ENCOUNTER — Other Ambulatory Visit: Payer: Self-pay

## 2018-11-10 ENCOUNTER — Encounter (HOSPITAL_COMMUNITY): Payer: Self-pay | Admitting: Emergency Medicine

## 2018-11-10 DIAGNOSIS — Z79899 Other long term (current) drug therapy: Secondary | ICD-10-CM | POA: Diagnosis not present

## 2018-11-10 DIAGNOSIS — Z20828 Contact with and (suspected) exposure to other viral communicable diseases: Secondary | ICD-10-CM | POA: Diagnosis not present

## 2018-11-10 DIAGNOSIS — I5042 Chronic combined systolic (congestive) and diastolic (congestive) heart failure: Secondary | ICD-10-CM | POA: Diagnosis not present

## 2018-11-10 DIAGNOSIS — E1122 Type 2 diabetes mellitus with diabetic chronic kidney disease: Secondary | ICD-10-CM | POA: Insufficient documentation

## 2018-11-10 DIAGNOSIS — Z992 Dependence on renal dialysis: Secondary | ICD-10-CM | POA: Diagnosis not present

## 2018-11-10 DIAGNOSIS — R112 Nausea with vomiting, unspecified: Secondary | ICD-10-CM | POA: Diagnosis present

## 2018-11-10 DIAGNOSIS — R509 Fever, unspecified: Secondary | ICD-10-CM | POA: Insufficient documentation

## 2018-11-10 DIAGNOSIS — I132 Hypertensive heart and chronic kidney disease with heart failure and with stage 5 chronic kidney disease, or end stage renal disease: Secondary | ICD-10-CM | POA: Diagnosis not present

## 2018-11-10 DIAGNOSIS — N186 End stage renal disease: Secondary | ICD-10-CM | POA: Diagnosis not present

## 2018-11-10 DIAGNOSIS — I2510 Atherosclerotic heart disease of native coronary artery without angina pectoris: Secondary | ICD-10-CM | POA: Insufficient documentation

## 2018-11-10 DIAGNOSIS — J449 Chronic obstructive pulmonary disease, unspecified: Secondary | ICD-10-CM | POA: Insufficient documentation

## 2018-11-10 DIAGNOSIS — F0631 Mood disorder due to known physiological condition with depressive features: Secondary | ICD-10-CM

## 2018-11-10 DIAGNOSIS — Z87891 Personal history of nicotine dependence: Secondary | ICD-10-CM | POA: Insufficient documentation

## 2018-11-10 LAB — CBC
HCT: 34.4 % — ABNORMAL LOW (ref 39.0–52.0)
Hemoglobin: 11 g/dL — ABNORMAL LOW (ref 13.0–17.0)
MCH: 31 pg (ref 26.0–34.0)
MCHC: 32 g/dL (ref 30.0–36.0)
MCV: 96.9 fL (ref 80.0–100.0)
Platelets: 125 10*3/uL — ABNORMAL LOW (ref 150–400)
RBC: 3.55 MIL/uL — ABNORMAL LOW (ref 4.22–5.81)
RDW: 13.8 % (ref 11.5–15.5)
WBC: 8.4 10*3/uL (ref 4.0–10.5)
nRBC: 0 % (ref 0.0–0.2)

## 2018-11-10 LAB — COMPREHENSIVE METABOLIC PANEL
ALT: 16 U/L (ref 0–44)
AST: 15 U/L (ref 15–41)
Albumin: 3.5 g/dL (ref 3.5–5.0)
Alkaline Phosphatase: 113 U/L (ref 38–126)
Anion gap: 15 (ref 5–15)
BUN: 88 mg/dL — ABNORMAL HIGH (ref 6–20)
CO2: 24 mmol/L (ref 22–32)
Calcium: 8.8 mg/dL — ABNORMAL LOW (ref 8.9–10.3)
Chloride: 94 mmol/L — ABNORMAL LOW (ref 98–111)
Creatinine, Ser: 7.51 mg/dL — ABNORMAL HIGH (ref 0.61–1.24)
GFR calc Af Amer: 9 mL/min — ABNORMAL LOW (ref >60)
GFR calc non Af Amer: 8 mL/min — ABNORMAL LOW (ref >60)
Glucose, Bld: 180 mg/dL — ABNORMAL HIGH (ref 70–99)
Potassium: 4.1 mmol/L (ref 3.5–5.1)
Sodium: 133 mmol/L — ABNORMAL LOW (ref 135–145)
Total Bilirubin: 0.6 mg/dL (ref 0.3–1.2)
Total Protein: 7.5 g/dL (ref 6.5–8.1)

## 2018-11-10 LAB — LIPASE, BLOOD: Lipase: 52 U/L — ABNORMAL HIGH (ref 11–51)

## 2018-11-10 LAB — SARS CORONAVIRUS 2 BY RT PCR (HOSPITAL ORDER, PERFORMED IN ~~LOC~~ HOSPITAL LAB): SARS Coronavirus 2: NEGATIVE

## 2018-11-10 MED ORDER — SODIUM CHLORIDE 0.9% FLUSH: 3.0000 mL | Freq: Once | INTRAVENOUS | Status: DC

## 2018-11-10 NOTE — ED Notes (Signed)
ED Provider at bedside. 

## 2018-11-10 NOTE — ED Provider Notes (Signed)
[facility] EMERGENCY DEPARTMENT Provider Note   CSN: [id number] Arrival date & time: 11/10/18  0242     History   Chief Complaint Chief Complaint  Patient presents with  . Emesis  . Fever    HPI Hadley Handrich is a 53 y.o. male.     Patient presents to the emergency department with a chief complaint of vomiting.  He reports 2 episodes of vomiting.  Patient states that he had a temperature of 95.4 at home and then it was 102.2 at home.  He was told by an answering service to come to the ER for evaluation.  He denies any pain.  He states that he has chronic cough.  He is a dialysis patient and is scheduled to be dialyzed today.  Denies any other complaints.  The history is provided by the patient. No language interpreter was used.    Past Medical History:  Diagnosis Date  . Chest pain 01/10/2016  . Chronic combined systolic and diastolic heart failure ([place])   . Coronary artery disease    a.12/22/15: NSTEMI s/p overlapping DES x2 and balloon angioplasty to distal AV groove Circumflex (too small for a stent).   . Coronary artery disease due to lipid rich plaque 01/10/2016  . Dehydration 08/22/2012  . Depression   . Elevated troponin   . ESRD (end stage renal disease) on dialysis ([place])   . Essential hypertension   . History of completed stroke 05/28/2017  . Hypercholesteremia   . Hypertension   . Hypertensive heart disease with heart failure ([place])   . Ischemic cardiomyopathy 03/13/2016  . Normocytic anemia 05/28/2017  . NSTEMI (non-ST elevated myocardial infarction) ([place]) 12/22/2015  . Prostate infection   . Status post coronary artery stent placement   . Syncope 03/13/2016  . Tobacco abuse   . Type 2 diabetes mellitus with diabetic nephropathy, without long-term current use of insulin ([place])   . Type II diabetes mellitus ([place])   . Uncontrolled hypertension 05/28/2017  . Unstable angina pectoris ([place]) 01/11/2016    Patient Active Problem List   Diagnosis Date  Noted  . COPD GOLD III  10/24/2017  . CAD (coronary artery disease), native coronary artery 10/10/2017  . ESRD (end stage renal disease) on dialysis ([place]) 06/25/2017  . Uncontrolled hypertension 05/28/2017  . Depression 05/28/2017  . Normocytic anemia 05/28/2017  . History of completed stroke 05/28/2017  . Syncope 03/13/2016  . Ischemic cardiomyopathy 03/13/2016  . Unstable angina pectoris ([place]) 01/11/2016  . Hypertensive heart disease with heart failure ([place])   . Chronic combined systolic and diastolic heart failure ([place])   . Elevated troponin   . Chest pain 01/10/2016  . Coronary artery disease due to lipid rich plaque 01/10/2016  . Status post coronary artery stent placement   . Type 2 diabetes mellitus with diabetic nephropathy, without long-term current use of insulin ([place])   . Tobacco abuse   . Essential hypertension   . Hypercholesteremia   . NSTEMI (non-ST elevated myocardial infarction) ([place]) 12/22/2015  . Lightheadedness 08/22/2012  . Dehydration 08/22/2012    Past Surgical History:  Procedure Laterality Date  . BASCILIC VEIN TRANSPOSITION Left 06/28/2017   Procedure: LEFT UPPER ARM ARTERIOVENOUS GORTEX-GRAFT PLACEMENT;  Surgeon: Angelia Mould, MD;  Location: [facility];  Service: Vascular;  Laterality: Left;  . CARDIAC CATHETERIZATION N/A 12/22/2015   Procedure: Left Heart Cath and Coronary Angiography;  Surgeon: Burnell Blanks, MD;  Location: [facility];  Service: Cardiovascular;  Laterality:  N/A;  . CARDIAC CATHETERIZATION N/A 12/22/2015   Procedure: Coronary Stent Intervention;  Surgeon: Burnell Blanks, MD;  Location: [facility];  Service: Cardiovascular;  Laterality: N/A;  . CARDIAC CATHETERIZATION N/A 01/11/2016   Procedure: Left Heart Cath and Coronary Angiography;  Surgeon: Sherren Mocha, MD;  Location: [facility];  Service: Cardiovascular;  Laterality: N/A;  . CARDIAC CATHETERIZATION N/A 01/11/2016   Procedure:  Intravascular Pressure Wire/FFR Study;  Surgeon: Sherren Mocha, MD;  Location: [facility];  Service: Cardiovascular;  Laterality: N/A;  . CARDIAC CATHETERIZATION N/A 01/11/2016   Procedure: Coronary Stent Intervention;  Surgeon: Sherren Mocha, MD;  Location: [facility];  Service: Cardiovascular;  Laterality: N/A;  . CORONARY ANGIOPLASTY WITH STENT PLACEMENT  12/22/2015  . INSERTION OF DIALYSIS CATHETER Right 06/28/2017   Procedure: INSERTION OF DIALYSIS CATHETER RIGHT INTERNAL JUGULAR;  Surgeon: Angelia Mould, MD;  Location: [facility] OR;  Service: Vascular;  Laterality: Right;        Home Medications    Prior to Admission medications   Medication Sig Start Date End Date Taking? Authorizing Provider  aspirin 81 MG chewable tablet CHEW ONE TABLET BY MOUTH DAILY 10/31/18   [name], MD  atorvastatin (LIPITOR) 10 MG tablet Take 1 tablet (10 mg total) by mouth daily. 06/10/18   Imogene Burn, PA-C  B Complex-C-Folic Acid (RENA-VITE RX) 1 MG TABS TAKE ONE TABLET BY MOUTH EVERY NIGHT AT BEDTIME 06/24/18   [name], MD  calcitRIOL (ROCALTROL) 0.5 MCG capsule TAKE 1 CAPSULE BY MOUTH ON MONDAY, WEDNESDAY, AND FRIDAY WITH HEMODIALYSIS 03/17/18   [name], MD  carvedilol (COREG) 6.25 MG tablet Take 1 tablet (6.25 mg total) by mouth 2 (two) times daily with a meal. 06/10/18   Imogene Burn, PA-C  clopidogrel (PLAVIX) 75 MG tablet TAKE ONE TABLET BY MOUTH DAILY 07/08/18   Burnell Blanks, MD  glucose blood (ONETOUCH VERIO) test strip 1 each by Other route as needed for other. Use as instructed 1-2 times per day. 04/24/18   [name], MD  Lancets [facility] DELICA PLUS [id number]) MISC USE ONELANCET TO TEST - FOUR TIMES A DAY 08/29/18   [name], MD  nitroGLYCERIN (NITROSTAT) 0.4 MG SL tablet Place 1 tablet (0.4 mg total) under the tongue every 5 (five) minutes x 3 doses as needed for chest pain. 12/26/15   Cheryln Manly, NP  sertraline  (ZOLOFT) 100 MG tablet Take 1 tablet (100 mg total) by mouth daily. 06/26/18   Frann Rider, NP  sevelamer carbonate (RENVELA) 800 MG tablet Take 1 tablet (800 mg total) by mouth 3 (three) times daily with meals. 08/15/17   [name], MD  STOOL SOFTENER 100 MG capsule TAKE ONE CAPSULE BY MOUTH TWICE A DAY 10/06/18   [name], MD  traZODone (DESYREL) 50 MG tablet TAKE ONE TABLET BY MOUTH EVERY NIGHT AT BEDTIME 10/27/18   [name], MD    Family History Family History  Problem Relation Age of Onset  . CAD Brother   . CVA Brother   . Chronic Renal Failure Brother 50       on HD  . CAD Brother     Social History Social History   Tobacco Use  . Smoking status: Former Smoker    Packs/day: 0.75    Years: 29.00    Pack years: 21.75    Types: Cigarettes    Quit date: 05/28/2017  Years since quitting: 1.4  . Smokeless tobacco: Never Used  Substance Use Topics  . Alcohol use: Not Currently    Alcohol/week: 7.0 standard drinks    Types: 7 Cans of beer per week    Frequency: Never    Comment: last drink was prior to last hospitalization  . Drug use: No     Allergies   Patient has no known allergies.   Review of Systems Review of Systems  All other systems reviewed and are negative.    Physical Exam Updated Vital Signs BP (!) 144/71 (BP Location: Right Arm)   Pulse 60   Temp 98.2 F (36.8 C) (Oral)   Resp (!) 23   SpO2 100%   Physical Exam Vitals signs and nursing note reviewed.  Constitutional:      Appearance: He is well-developed.  HENT:     Head: Normocephalic and atraumatic.  Eyes:     Conjunctiva/sclera: Conjunctivae normal.  Neck:     Musculoskeletal: Neck supple.  Cardiovascular:     Rate and Rhythm: Normal rate and regular rhythm.     Heart sounds: No murmur.  Pulmonary:     Effort: Pulmonary effort is normal. No respiratory distress.     Breath sounds: Normal breath sounds.     Comments: CTAB Abdominal:     Palpations:  Abdomen is soft.     Tenderness: There is no abdominal tenderness.     Comments: No focal abdominal tenderness, no RLQ tenderness or pain at McBurney's point, no RUQ tenderness or Murphy's sign, no left-sided abdominal tenderness, no fluid wave, or signs of peritonitis   Musculoskeletal: Normal range of motion.  Skin:    General: Skin is warm and dry.  Neurological:     Mental Status: He is alert and oriented to person, place, and time.  Psychiatric:        Mood and Affect: Mood normal.        Behavior: Behavior normal.      ED Treatments / Results  Labs (all labs ordered are listed, but only abnormal results are displayed) Labs Reviewed  LIPASE, BLOOD - Abnormal; Notable for the following components:      Result Value   Lipase 52 (*)    All other components within normal limits  COMPREHENSIVE METABOLIC PANEL - Abnormal; Notable for the following components:   Sodium 133 (*)    Chloride 94 (*)    Glucose, Bld 180 (*)    BUN 88 (*)    Creatinine, Ser 7.51 (*)    Calcium 8.8 (*)    GFR calc non Af Amer 8 (*)    GFR calc Af Amer 9 (*)    All other components within normal limits  CBC - Abnormal; Notable for the following components:   RBC 3.55 (*)    Hemoglobin 11.0 (*)    HCT 34.4 (*)    Platelets 125 (*)    All other components within normal limits  SARS CORONAVIRUS 2 (HOSPITAL ORDER, [facility] LAB)  URINALYSIS, ROUTINE W REFLEX MICROSCOPIC    EKG None  Radiology Dg Chest Portable 1 View  Result Date: 11/10/2018 CLINICAL DATA:  Fever EXAM: PORTABLE CHEST 1 VIEW COMPARISON:  01/26/2018 FINDINGS: Normal heart size and mediastinal contours. No acute infiltrate or edema. When correlated with priors, presumed nipple shadow at the left base. No effusion or pneumothorax. No acute osseous findings. Incidental bifid left anterior eighth rib. IMPRESSION: No active disease. Electronically Signed   By:  Monte Fantasia M.D.   On: 11/10/2018 05:42     Procedures Procedures (including critical care time)  Medications Ordered in ED Medications  sodium chloride flush (NS) 0.9 % injection 3 mL (has no administration in time range)     Initial Impression / Assessment and Plan / ED Course  I have reviewed the triage vital signs and the nursing notes.  Pertinent labs & imaging results that were available during my care of the patient were reviewed by me and considered in my medical decision making (see chart for details).        Patient with reported fever at home.  Reports that he had a temperature of 95 102.  Denies any recent illnesses.  States that he had an episode of vomiting, but otherwise denies any symptoms.  He denies being in any pain.  He is afebrile here.  Vital signs are stable.  Chest x-ray is negative.  COVID test is negative.  Laboratory work-up is reassuring and is at or near baseline for patient.  He is in no acute distress.  Plan for discharge to home with close follow-up.  Patient understands and agrees with plan.  He is stable and ready for discharge.  Final Clinical Impressions(s) / ED Diagnoses   Final diagnoses:  None    ED Discharge Orders    None       Montine Circle, PA-C 11/10/18 [id number]    Veryl Speak, MD 11/10/18 [id number]

## 2018-11-10 NOTE — ED Triage Notes (Signed)
C/o vomiting x 2 since 7pm.  Fever 102.2 at home.  Took Ibuprofen at 8pm.  Denies abd pain and diarrhea.

## 2018-11-10 NOTE — ED Notes (Addendum)
Pt states does not have to urinate at this time- unable to collect urine sample.

## 2018-11-10 NOTE — Telephone Encounter (Signed)
Pt is needing a refill on his sertraline (ZOLOFT) 100 MG tablet sent to the [facility]

## 2018-11-10 NOTE — Discharge Instructions (Addendum)
Please contact you dialysis center and let them know you had reassuring labs and workup in the ER today.  No clear explanation for your symptoms was found.  Return for new or worsening symptoms.

## 2018-11-11 ENCOUNTER — Other Ambulatory Visit: Payer: Self-pay | Admitting: Adult Health

## 2018-11-11 DIAGNOSIS — F0631 Mood disorder due to known physiological condition with depressive features: Secondary | ICD-10-CM

## 2018-11-11 NOTE — Telephone Encounter (Signed)
Refilled x 30 days.  Will need further refills from pcp.

## 2018-11-25 ENCOUNTER — Encounter: Payer: Self-pay | Admitting: Podiatry

## 2018-11-25 ENCOUNTER — Ambulatory Visit: Payer: BC Managed Care – PPO

## 2018-11-25 ENCOUNTER — Ambulatory Visit: Payer: BC Managed Care – PPO | Admitting: Podiatry

## 2018-11-25 ENCOUNTER — Other Ambulatory Visit: Payer: Self-pay

## 2018-11-25 VITALS — BP 137/73 | HR 69 | Resp 16

## 2018-11-25 DIAGNOSIS — E1149 Type 2 diabetes mellitus with other diabetic neurological complication: Secondary | ICD-10-CM

## 2018-11-25 DIAGNOSIS — E119 Type 2 diabetes mellitus without complications: Secondary | ICD-10-CM | POA: Diagnosis not present

## 2018-11-25 DIAGNOSIS — B351 Tinea unguium: Secondary | ICD-10-CM

## 2018-11-25 DIAGNOSIS — M79674 Pain in right toe(s): Secondary | ICD-10-CM

## 2018-11-25 DIAGNOSIS — M79675 Pain in left toe(s): Secondary | ICD-10-CM | POA: Diagnosis not present

## 2018-11-25 DIAGNOSIS — T1490XA Injury, unspecified, initial encounter: Secondary | ICD-10-CM

## 2018-11-25 NOTE — Progress Notes (Signed)
Subjective:   Patient ID: Jeffery Young, male   DOB: 53 y.o.   MRN: [id number]   HPI 53 year old male presents the office for concerns of his right big toenail cracking and becoming discolored.  He denies any drainage strengthen the area.  He states he has fungus.  Denies any pain but he has been diagnosed with neuropathy.  He also has a history of stroke. He is on dialysis.   Last A1c 6.4- diet controlled   On Plavix.   Review of Systems  All other systems reviewed and are negative.  Past Medical History:  Diagnosis Date  . Chest pain 01/10/2016  . Chronic combined systolic and diastolic heart failure ([place])   . Coronary artery disease    a.12/22/15: NSTEMI s/p overlapping DES x2 and balloon angioplasty to distal AV groove Circumflex (too small for a stent).   . Coronary artery disease due to lipid rich plaque 01/10/2016  . Dehydration 08/22/2012  . Depression   . Elevated troponin   . ESRD (end stage renal disease) on dialysis ([place])   . Essential hypertension   . History of completed stroke 05/28/2017  . Hypercholesteremia   . Hypertension   . Hypertensive heart disease with heart failure ([place])   . Ischemic cardiomyopathy 03/13/2016  . Normocytic anemia 05/28/2017  . NSTEMI (non-ST elevated myocardial infarction) ([place]) 12/22/2015  . Prostate infection   . Status post coronary artery stent placement   . Syncope 03/13/2016  . Tobacco abuse   . Type 2 diabetes mellitus with diabetic nephropathy, without long-term current use of insulin ([place])   . Type II diabetes mellitus ([place])   . Uncontrolled hypertension 05/28/2017  . Unstable angina pectoris ([place]) 01/11/2016    Past Surgical History:  Procedure Laterality Date  . BASCILIC VEIN TRANSPOSITION Left 06/28/2017   Procedure: LEFT UPPER ARM ARTERIOVENOUS GORTEX-GRAFT PLACEMENT;  Surgeon: Angelia Mould, MD;  Location: [facility];  Service: Vascular;  Laterality: Left;  . CARDIAC CATHETERIZATION N/A 12/22/2015   Procedure: Left Heart  Cath and Coronary Angiography;  Surgeon: Burnell Blanks, MD;  Location: [facility];  Service: Cardiovascular;  Laterality: N/A;  . CARDIAC CATHETERIZATION N/A 12/22/2015   Procedure: Coronary Stent Intervention;  Surgeon: Burnell Blanks, MD;  Location: [facility];  Service: Cardiovascular;  Laterality: N/A;  . CARDIAC CATHETERIZATION N/A 01/11/2016   Procedure: Left Heart Cath and Coronary Angiography;  Surgeon: Sherren Mocha, MD;  Location: [facility];  Service: Cardiovascular;  Laterality: N/A;  . CARDIAC CATHETERIZATION N/A 01/11/2016   Procedure: Intravascular Pressure Wire/FFR Study;  Surgeon: Sherren Mocha, MD;  Location: [facility];  Service: Cardiovascular;  Laterality: N/A;  . CARDIAC CATHETERIZATION N/A 01/11/2016   Procedure: Coronary Stent Intervention;  Surgeon: Sherren Mocha, MD;  Location: [facility];  Service: Cardiovascular;  Laterality: N/A;  . CORONARY ANGIOPLASTY WITH STENT PLACEMENT  12/22/2015  . INSERTION OF DIALYSIS CATHETER Right 06/28/2017   Procedure: INSERTION OF DIALYSIS CATHETER RIGHT INTERNAL JUGULAR;  Surgeon: Angelia Mould, MD;  Location: [facility] OR;  Service: Vascular;  Laterality: Right;     Current Outpatient Medications:  .  aspirin 81 MG chewable tablet, CHEW ONE TABLET BY MOUTH DAILY, Disp: 90 tablet, Rfl: 0 .  atorvastatin (LIPITOR) 10 MG tablet, Take 1 tablet (10 mg total) by mouth daily., Disp: 90 tablet, Rfl: 3 .  B Complex-C-Folic Acid (RENA-VITE RX) 1 MG TABS, TAKE ONE TABLET BY MOUTH EVERY NIGHT AT BEDTIME, Disp: 90  tablet, Rfl: 0 .  calcitRIOL (ROCALTROL) 0.5 MCG capsule, TAKE 1 CAPSULE BY MOUTH ON MONDAY, WEDNESDAY, AND FRIDAY WITH HEMODIALYSIS, Disp: 30 capsule, Rfl: 0 .  carvedilol (COREG) 6.25 MG tablet, Take 1 tablet (6.25 mg total) by mouth 2 (two) times daily with a meal., Disp: 180 tablet, Rfl: 3 .  clopidogrel (PLAVIX) 75 MG tablet, TAKE ONE TABLET BY MOUTH DAILY, Disp: 90 tablet,  Rfl: 1 .  glucose blood (ONETOUCH VERIO) test strip, 1 each by Other route as needed for other. Use as instructed 1-2 times per day., Disp: 100 each, Rfl: 2 .  Lancets (ONETOUCH DELICA PLUS [id number]) MISC, USE ONELANCET TO TEST - FOUR TIMES A DAY, Disp: 100 each, Rfl: 1 .  nitroGLYCERIN (NITROSTAT) 0.4 MG SL tablet, Place 1 tablet (0.4 mg total) under the tongue every 5 (five) minutes x 3 doses as needed for chest pain., Disp: 25 tablet, Rfl: 3 .  sertraline (ZOLOFT) 100 MG tablet, TAKE ONE TABLET BY MOUTH DAILY, Disp: 30 tablet, Rfl: 0 .  sevelamer carbonate (RENVELA) 800 MG tablet, Take 1 tablet (800 mg total) by mouth 3 (three) times daily with meals., Disp: 90 tablet, Rfl: 1 .  STOOL SOFTENER 100 MG capsule, TAKE ONE CAPSULE BY MOUTH TWICE A DAY, Disp: 60 capsule, Rfl: 1 .  traZODone (DESYREL) 50 MG tablet, TAKE ONE TABLET BY MOUTH EVERY NIGHT AT BEDTIME, Disp: 30 tablet, Rfl: 0  No Known Allergies     Objective:  Physical Exam  General: AAO x3, NAD  Dermatological: Nails are hypertrophic, dystrophic with yellow-brown discoloration postnatally the right hallux toenail has a vertical splitting the nail has yellow discoloration.  There is no dark discoloration.  No edema, erythema or any clinical signs of bacterial infection.  No open lesions identified today.       Vascular: DP pulses 2/4, PT pulses 1/4, CRT less than 3 seconds.  There is no pain with calf compression, swelling, warmth, erythema.   Neruologic: Sensation intact with Thornell Mule monofilament but he has been recently diagnosed with neuropathy.  He states he does not have much feeling to his feet.  Musculoskeletal: In wheelchair     Assessment:   Symptomatic onychomycosis, currently on Plavix    Plan:  -Treatment options discussed including all alternatives, risks, and complications -Etiology of symptoms were discussed -Nails debrided 10 without complications or bleeding.  He was interested in treatment for  nail fungus.  Discussed topical medications both over-the-counter and prescription.  He can start with over-the-counter Fungi-Nail. -Daily foot inspection -Follow-up in 3 months or sooner if any problems arise. In the meantime, encouraged to call the office with any questions, concerns, change in symptoms.   Celesta Gentile, DPM

## 2018-11-25 NOTE — Patient Instructions (Signed)
Look at getting fungi-nail to apply to the toenails  If was nice to meet you today. If you have any questions or any further concerns, please feel fee to give me a call. You can call our office at [phone] or please feel fee to send me a message through [facility].    Diabetes Mellitus and Foot Care Foot care is an important part of your health, especially when you have diabetes. Diabetes may cause you to have problems because of poor blood flow (circulation) to your feet and legs, which can cause your skin to:  Become thinner and drier.  Break more easily.  Heal more slowly.  Peel and crack. You may also have nerve damage (neuropathy) in your legs and feet, causing decreased feeling in them. This means that you may not notice minor injuries to your feet that could lead to more serious problems. Noticing and addressing any potential problems early is the best way to prevent future foot problems. How to care for your feet Foot hygiene  Wash your feet daily with warm water and mild soap. Do not use hot water. Then, pat your feet and the areas between your toes until they are completely dry. Do not soak your feet as this can dry your skin.  Trim your toenails straight across. Do not dig under them or around the cuticle. File the edges of your nails with an emery board or nail file.  Apply a moisturizing lotion or petroleum jelly to the skin on your feet and to dry, brittle toenails. Use lotion that does not contain alcohol and is unscented. Do not apply lotion between your toes. Shoes and socks  Wear clean socks or stockings every day. Make sure they are not too tight. Do not wear knee-high stockings since they may decrease blood flow to your legs.  Wear shoes that fit properly and have enough cushioning. Always look in your shoes before you put them on to be sure there are no objects inside.  To break in new shoes, wear them for just a few hours a day. This prevents injuries on your  feet. Wounds, scrapes, corns, and calluses  Check your feet daily for blisters, cuts, bruises, sores, and redness. If you cannot see the bottom of your feet, use a mirror or ask someone for help.  Do not cut corns or calluses or try to remove them with medicine.  If you find a minor scrape, cut, or break in the skin on your feet, keep it and the skin around it clean and dry. You may clean these areas with mild soap and water. Do not clean the area with peroxide, alcohol, or iodine.  If you have a wound, scrape, corn, or callus on your foot, look at it several times a day to make sure it is healing and not infected. Check for: ? Redness, swelling, or pain. ? Fluid or blood. ? Warmth. ? Pus or a bad smell. General instructions  Do not cross your legs. This may decrease blood flow to your feet.  Do not use heating pads or hot water bottles on your feet. They may burn your skin. If you have lost feeling in your feet or legs, you may not know this is happening until it is too late.  Protect your feet from hot and cold by wearing shoes, such as at the beach or on hot pavement.  Schedule a complete foot exam at least once a year (annually) or more often if you have foot  problems. If you have foot problems, report any cuts, sores, or bruises to your health care provider immediately. Contact a health care provider if:  You have a medical condition that increases your risk of infection and you have any cuts, sores, or bruises on your feet.  You have an injury that is not healing.  You have redness on your legs or feet.  You feel burning or tingling in your legs or feet.  You have pain or cramps in your legs and feet.  Your legs or feet are numb.  Your feet always feel cold.  You have pain around a toenail. Get help right away if:  You have a wound, scrape, corn, or callus on your foot and: ? You have pain, swelling, or redness that gets worse. ? You have fluid or blood coming from  the wound, scrape, corn, or callus. ? Your wound, scrape, corn, or callus feels warm to the touch. ? You have pus or a bad smell coming from the wound, scrape, corn, or callus. ? You have a fever. ? You have a red line going up your leg. Summary  Check your feet every day for cuts, sores, red spots, swelling, and blisters.  Moisturize feet and legs daily.  Wear shoes that fit properly and have enough cushioning.  If you have foot problems, report any cuts, sores, or bruises to your health care provider immediately.  Schedule a complete foot exam at least once a year (annually) or more often if you have foot problems. This information is not intended to replace advice given to you by your health care provider. Make sure you discuss any questions you have with your health care provider. Document Released: 01/20/2000 Document Revised: 03/06/2017 Document Reviewed: 02/24/2016 Elsevier Patient Education  2020 [employer].

## 2018-11-27 ENCOUNTER — Encounter: Payer: Self-pay | Admitting: Family Medicine

## 2018-11-27 ENCOUNTER — Telehealth (INDEPENDENT_AMBULATORY_CARE_PROVIDER_SITE_OTHER): Payer: BC Managed Care – PPO | Admitting: Family Medicine

## 2018-11-27 ENCOUNTER — Other Ambulatory Visit: Payer: Self-pay

## 2018-11-27 DIAGNOSIS — F32A Depression, unspecified: Secondary | ICD-10-CM

## 2018-11-27 DIAGNOSIS — I69398 Other sequelae of cerebral infarction: Secondary | ICD-10-CM

## 2018-11-27 DIAGNOSIS — F329 Major depressive disorder, single episode, unspecified: Secondary | ICD-10-CM

## 2018-11-27 DIAGNOSIS — F0631 Mood disorder due to known physiological condition with depressive features: Secondary | ICD-10-CM

## 2018-11-27 DIAGNOSIS — G4700 Insomnia, unspecified: Secondary | ICD-10-CM | POA: Diagnosis not present

## 2018-11-27 MED ORDER — TRAZODONE HCL 50 MG PO TABS: 50.0000 mg | ORAL_TABLET | Freq: Every day | ORAL | 1 refills | Status: DC

## 2018-11-27 MED ORDER — SERTRALINE HCL 100 MG PO TABS: 100.0000 mg | ORAL_TABLET | Freq: Every day | ORAL | 1 refills | Status: DC

## 2018-11-27 NOTE — Progress Notes (Signed)
Virtual Visit via Telephone Note  I connected with Jeffery Young on 11/27/18 at 1:37 PM by telephone and verified that I am speaking with the correct person using two identifiers.   I discussed the limitations, risks, security and privacy concerns of performing an evaluation and management service by telephone and the availability of in person appointments. I also discussed with the patient that there may be a patient responsible charge related to this service. The patient expressed understanding and agreed to proceed, consent obtained  Chief complaint:  depression  History of Present Illness: Jeffery Young is a 53 y.o. male  History of multiple medical problems including chronic kidney disease on hemodialysis, prior CVA with residual deficit.   Depression:  Depression screen [facility] 2/9 11/27/2018 10/16/2018 06/26/2018 06/24/2018 04/24/2018  Decreased Interest 0 0 0 0 0  Down, Depressed, Hopeless 0 0 2 0 0  PHQ - 2 Score 0 0 2 0 0  Altered sleeping - - 0 - -  Tired, decreased energy - - 0 - -  Change in appetite - - 0 - -  Feeling bad or failure about yourself  - - 1 - -  Trouble concentrating - - 0 - -  Moving slowly or fidgety/restless - - 0 - -  Suicidal thoughts - - 0 - -  PHQ-9 Score - - 3 - -  Difficult doing work/chores - - - - -   Depression discussed last visit in September.  Had increased symptoms with not working and being home all day.  He had been increased from 50 mg of Zoloft to 100 mg by neurology, but still taking trazodone for sleep which was helpful.  Plan for home PT/OT, and number for counseling provided.  Some history provided by ex wife.   Gad7-0, Phq9. Still taking zoloft 100mg  qd.  Taking trazodone nightly, but some difficulty getting to sleep on Sunday nights and 2 other days per week trouble getting to sleep.  No daytime naps.  Feels about the same. Has not yet had PT/OT - planning on trying outpatient instead of in home. Therapy has been coordinating  this.   appt in late December with therapist.   More info may be needed for assisted living/placement "work order?"  Advised Ms. Hovel to let us know information of specific facility or contact person to clarify needed info.     Patient Active Problem List   Diagnosis Date Noted  . COPD GOLD III  10/24/2017  . CAD (coronary artery disease), native coronary artery 10/10/2017  . ESRD (end stage renal disease) on dialysis ([place]) 06/25/2017  . Uncontrolled hypertension 05/28/2017  . Depression 05/28/2017  . Normocytic anemia 05/28/2017  . History of completed stroke 05/28/2017  . Syncope 03/13/2016  . Ischemic cardiomyopathy 03/13/2016  . Unstable angina pectoris ([place]) 01/11/2016  . Hypertensive heart disease with heart failure ([place])   . Chronic combined systolic and diastolic heart failure ([place])   . Elevated troponin   . Chest pain 01/10/2016  . Coronary artery disease due to lipid rich plaque 01/10/2016  . Status post coronary artery stent placement   . Type 2 diabetes mellitus with diabetic nephropathy, without long-term current use of insulin ([place])   . Tobacco abuse   . Essential hypertension   . Hypercholesteremia   . NSTEMI (non-ST elevated myocardial infarction) ([place]) 12/22/2015  . Lightheadedness 08/22/2012  . Dehydration 08/22/2012   Past Medical History:  Diagnosis Date  . Chest pain 01/10/2016  . Chronic combined systolic and  diastolic heart failure ([place])   . Coronary artery disease    a.12/22/15: NSTEMI s/p overlapping DES x2 and balloon angioplasty to distal AV groove Circumflex (too small for a stent).   . Coronary artery disease due to lipid rich plaque 01/10/2016  . Dehydration 08/22/2012  . Depression   . Elevated troponin   . ESRD (end stage renal disease) on dialysis ([place])   . Essential hypertension   . History of completed stroke 05/28/2017  . Hypercholesteremia   . Hypertension   . Hypertensive heart disease with heart failure ([place])   . Ischemic  cardiomyopathy 03/13/2016  . Normocytic anemia 05/28/2017  . NSTEMI (non-ST elevated myocardial infarction) ([place]) 12/22/2015  . Prostate infection   . Status post coronary artery stent placement   . Syncope 03/13/2016  . Tobacco abuse   . Type 2 diabetes mellitus with diabetic nephropathy, without long-term current use of insulin ([place])   . Type II diabetes mellitus ([place])   . Uncontrolled hypertension 05/28/2017  . Unstable angina pectoris ([place]) 01/11/2016   Past Surgical History:  Procedure Laterality Date  . BASCILIC VEIN TRANSPOSITION Left 06/28/2017   Procedure: LEFT UPPER ARM ARTERIOVENOUS GORTEX-GRAFT PLACEMENT;  Surgeon: Angelia Mould, MD;  Location: [facility];  Service: Vascular;  Laterality: Left;  . CARDIAC CATHETERIZATION N/A 12/22/2015   Procedure: Left Heart Cath and Coronary Angiography;  Surgeon: Burnell Blanks, MD;  Location: [place] [facility];  Service: Cardiovascular;  Laterality: N/A;  . CARDIAC CATHETERIZATION N/A 12/22/2015   Procedure: Coronary Stent Intervention;  Surgeon: Burnell Blanks, MD;  Location: [place] [facility];  Service: Cardiovascular;  Laterality: N/A;  . CARDIAC CATHETERIZATION N/A 01/11/2016   Procedure: Left Heart Cath and Coronary Angiography;  Surgeon: Sherren Mocha, MD;  Location: [place] [facility];  Service: Cardiovascular;  Laterality: N/A;  . CARDIAC CATHETERIZATION N/A 01/11/2016   Procedure: Intravascular Pressure Wire/FFR Study;  Surgeon: Sherren Mocha, MD;  Location: [place] [facility];  Service: Cardiovascular;  Laterality: N/A;  . CARDIAC CATHETERIZATION N/A 01/11/2016   Procedure: Coronary Stent Intervention;  Surgeon: Sherren Mocha, MD;  Location: [place] [facility];  Service: Cardiovascular;  Laterality: N/A;  . CORONARY ANGIOPLASTY WITH STENT PLACEMENT  12/22/2015  . INSERTION OF DIALYSIS CATHETER Right 06/28/2017   Procedure: INSERTION OF DIALYSIS CATHETER RIGHT INTERNAL JUGULAR;  Surgeon: Angelia Mould, MD;   Location: [facility];  Service: Vascular;  Laterality: Right;   No Known Allergies Prior to Admission medications   Medication Sig Start Date End Date Taking? Authorizing Provider  aspirin 81 MG chewable tablet CHEW ONE TABLET BY MOUTH DAILY 10/31/18  Yes [name], MD  atorvastatin (LIPITOR) 10 MG tablet Take 1 tablet (10 mg total) by mouth daily. 06/10/18  Yes Imogene Burn, PA-C  B Complex-C-Folic Acid (RENA-VITE RX) 1 MG TABS TAKE ONE TABLET BY MOUTH EVERY NIGHT AT BEDTIME 06/24/18  Yes [name], MD  calcitRIOL (ROCALTROL) 0.5 MCG capsule TAKE 1 CAPSULE BY MOUTH ON MONDAY, WEDNESDAY, AND FRIDAY WITH HEMODIALYSIS 03/17/18  Yes [name], MD  carvedilol (COREG) 6.25 MG tablet Take 1 tablet (6.25 mg total) by mouth 2 (two) times daily with a meal. 06/10/18  Yes Imogene Burn, PA-C  clopidogrel (PLAVIX) 75 MG tablet TAKE ONE TABLET BY MOUTH DAILY 07/08/18  Yes Burnell Blanks, MD  glucose blood (ONETOUCH VERIO) test strip 1 each by Other route as needed for other. Use as instructed 1-2 times per day. 04/24/18  Yes [name], MD  Lancets [facility] DELICA PLUS [id number]) MISC USE ONELANCET TO TEST - FOUR TIMES A DAY 08/29/18  Yes [name], MD  nitroGLYCERIN (NITROSTAT) 0.4 MG SL tablet Place 1 tablet (0.4 mg total) under the tongue every 5 (five) minutes x 3 doses as needed for chest pain. 12/26/15  Yes Reino Bellis B, NP  sertraline (ZOLOFT) 100 MG tablet TAKE ONE TABLET BY MOUTH DAILY 11/11/18  Yes Frann Rider, NP  sevelamer carbonate (RENVELA) 800 MG tablet Take 1 tablet (800 mg total) by mouth 3 (three) times daily with meals. 08/15/17  Yes [name], MD  STOOL SOFTENER 100 MG capsule TAKE ONE CAPSULE BY MOUTH TWICE A DAY 10/06/18  Yes [name], MD  traZODone (DESYREL) 50 MG tablet TAKE ONE TABLET BY MOUTH EVERY NIGHT AT BEDTIME 10/27/18  Yes [name], MD   Social History   Socioeconomic History  . Marital status: Married     Spouse name: Not on file  . Number of children: Not on file  . Years of education: Not on file  . Highest education level: Not on file  Occupational History  . Occupation: International aid/development worker: [employer]  Social Needs  . Financial resource strain: Not on file  . Food insecurity    Worry: Not on file    Inability: Not on file  . Transportation needs    Medical: Not on file    Non-medical: Not on file  Tobacco Use  . Smoking status: Former Smoker    Packs/day: 0.75    Years: 29.00    Pack years: 21.75    Types: Cigarettes    Quit date: 05/28/2017    Years since quitting: 1.5  . Smokeless tobacco: Never Used  Substance and Sexual Activity  . Alcohol use: Not Currently    Alcohol/week: 7.0 standard drinks    Types: 7 Cans of beer per week    Frequency: Never    Comment: last drink was prior to last hospitalization  . Drug use: No  . Sexual activity: Not Currently  Lifestyle  . Physical activity    Days per week: Not on file    Minutes per session: Not on file  . Stress: Not on file  Relationships  . Social Herbalist on phone: Not on file    Gets together: Not on file    Attends religious service: Not on file    Active member of club or organization: Not on file    Attends meetings of clubs or organizations: Not on file    Relationship status: Not on file  . Intimate partner violence    Fear of current or ex partner: Not on file    Emotionally abused: Not on file    Physically abused: Not on file    Forced sexual activity: Not on file  Other Topics Concern  . Not on file  Social History Narrative  . Not on file     Observations/Objective: No distress. Appropriate responses.  Denies SI.   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  Handout on insomnia. PT/OT pending.  New number provided for counseling.  Recheck in  3 weeks.   If other info needed for care facility/placement, can discuss with specific facility. Previously completed FL2.    Follow Up Instructions:  3 weeks.   I discussed the assessment and treatment plan with the patient. The patient was provided an opportunity to ask questions and all were answered. The patient agreed with the plan and demonstrated an understanding of the instructions.   The patient was advised to call back or seek an in-person evaluation if the symptoms worsen or if the condition fails to improve as anticipated.  I provided 14 minutes of non-face-to-face time during this encounter.  Signed,   Merri Ray, MD [facility].  11/27/18

## 2018-11-27 NOTE — Progress Notes (Signed)
CC- 6 weeks f/u on depression- GAD7=0PHQ9=3

## 2018-11-27 NOTE — Patient Instructions (Addendum)
See information on sleep hygiene and insomnia. No change in meds for now.  Meeting with counselor should help but I also think that physical therapy/Occupational Therapy will help once that has started. Recheck in 3 weeks.  Please let me know on my staff know of specific care facility or contact person at the care facility if other information needed for placement.  If we do need to complete a new FL 2, please let me know.  Let me know if there are other questions.  Take care.   Here is a number for other counseling to set up appointment sooner if needed.  Address: [address], [place], [place] [address]  Phone: [phone]   Insomnia Insomnia is a sleep disorder that makes it difficult to fall asleep or stay asleep. Insomnia can cause fatigue, low energy, difficulty concentrating, mood swings, and poor performance at work or school. There are three different ways to classify insomnia:  Difficulty falling asleep.  Difficulty staying asleep.  Waking up too early in the morning. Any type of insomnia can be long-term (chronic) or short-term (acute). Both are common. Short-term insomnia usually lasts for three months or less. Chronic insomnia occurs at least three times a week for longer than three months. What are the causes? Insomnia may be caused by another condition, situation, or substance, such as:  Anxiety.  Certain medicines.  Gastroesophageal reflux disease (GERD) or other gastrointestinal conditions.  Asthma or other breathing conditions.  Restless legs syndrome, sleep apnea, or other sleep disorders.  Chronic pain.  Menopause.  Stroke.  Abuse of alcohol, tobacco, or illegal drugs.  Mental health conditions, such as depression.  Caffeine.  Neurological disorders, such as Alzheimer's disease.  An overactive thyroid (hyperthyroidism). Sometimes, the cause of insomnia may not be known. What increases the risk? Risk factors for insomnia include:  Gender.  Women are affected more often than men.  Age. Insomnia is more common as you get older.  Stress.  Lack of exercise.  Irregular work schedule or working night shifts.  Traveling between different time zones.  Certain medical and mental health conditions. What are the signs or symptoms? If you have insomnia, the main symptom is having trouble falling asleep or having trouble staying asleep. This may lead to other symptoms, such as:  Feeling fatigued or having low energy.  Feeling nervous about going to sleep.  Not feeling rested in the morning.  Having trouble concentrating.  Feeling irritable, anxious, or depressed. How is this diagnosed? This condition may be diagnosed based on:  Your symptoms and medical history. Your health care provider may ask about: ? Your sleep habits. ? Any medical conditions you have. ? Your mental health.  A physical exam. How is this treated? Treatment for insomnia depends on the cause. Treatment may focus on treating an underlying condition that is causing insomnia. Treatment may also include:  Medicines to help you sleep.  Counseling or therapy.  Lifestyle adjustments to help you sleep better. Follow these instructions at home: Eating and drinking   Limit or avoid alcohol, caffeinated beverages, and cigarettes, especially close to bedtime. These can disrupt your sleep.  Do not eat a large meal or eat spicy foods right before bedtime. This can lead to digestive discomfort that can make it hard for you to sleep. Sleep habits   Keep a sleep diary to help you and your health care provider figure out what could be causing your insomnia. Write down: ? When you sleep. ? When you wake  up during the night. ? How well you sleep. ? How rested you feel the next day. ? Any side effects of medicines you are taking. ? What you eat and drink.  Make your bedroom a dark, comfortable place where it is easy to fall asleep. ? Put up shades or  blackout curtains to block light from outside. ? Use a white noise machine to block noise. ? Keep the temperature cool.  Limit screen use before bedtime. This includes: ? Watching TV. ? Using your smartphone, tablet, or computer.  Stick to a routine that includes going to bed and waking up at the same times every day and night. This can help you fall asleep faster. Consider making a quiet activity, such as reading, part of your nighttime routine.  Try to avoid taking naps during the day so that you sleep better at night.  Get out of bed if you are still awake after 15 minutes of trying to sleep. Keep the lights down, but try reading or doing a quiet activity. When you feel sleepy, go back to bed. General instructions  Take over-the-counter and prescription medicines only as told by your health care provider.  Exercise regularly, as told by your health care provider. Avoid exercise starting several hours before bedtime.  Use relaxation techniques to manage stress. Ask your health care provider to suggest some techniques that may work well for you. These may include: ? Breathing exercises. ? Routines to release muscle tension. ? Visualizing peaceful scenes.  Make sure that you drive carefully. Avoid driving if you feel very sleepy.  Keep all follow-up visits as told by your health care provider. This is important. Contact a health care provider if:  You are tired throughout the day.  You have trouble in your daily routine due to sleepiness.  You continue to have sleep problems, or your sleep problems get worse. Get help right away if:  You have serious thoughts about hurting yourself or someone else. If you ever feel like you may hurt yourself or others, or have thoughts about taking your own life, get help right away. You can go to your nearest emergency department or call:  Your local emergency services (911 in the [place]  A suicide crisis helpline, such as the [facility] at 732-564-2126. This is open 24 hours a day. Summary  Insomnia is a sleep disorder that makes it difficult to fall asleep or stay asleep.  Insomnia can be long-term (chronic) or short-term (acute).  Treatment for insomnia depends on the cause. Treatment may focus on treating an underlying condition that is causing insomnia.  Keep a sleep diary to help you and your health care provider figure out what could be causing your insomnia. This information is not intended to replace advice given to you by your health care provider. Make sure you discuss any questions you have with your health care provider. Document Released: 01/20/2000 Document Revised: 01/04/2017 Document Reviewed: 11/01/2016 Elsevier Patient Education  2020 [employer].

## 2018-12-07 ENCOUNTER — Other Ambulatory Visit: Payer: Self-pay | Admitting: Family Medicine

## 2018-12-07 DIAGNOSIS — N186 End stage renal disease: Secondary | ICD-10-CM

## 2018-12-13 ENCOUNTER — Other Ambulatory Visit: Payer: Self-pay | Admitting: Family Medicine

## 2018-12-13 DIAGNOSIS — N186 End stage renal disease: Secondary | ICD-10-CM

## 2018-12-13 DIAGNOSIS — Z992 Dependence on renal dialysis: Secondary | ICD-10-CM

## 2018-12-13 NOTE — Telephone Encounter (Signed)
Forwarding medication refill request to the clinical pool for review. 

## 2018-12-25 ENCOUNTER — Other Ambulatory Visit: Payer: Self-pay | Admitting: Emergency Medicine

## 2018-12-25 DIAGNOSIS — K5900 Constipation, unspecified: Secondary | ICD-10-CM

## 2018-12-25 DIAGNOSIS — N186 End stage renal disease: Secondary | ICD-10-CM

## 2018-12-25 MED ORDER — DOCUSATE SODIUM 100 MG PO CAPS: 100.0000 mg | ORAL_CAPSULE | Freq: Two times a day (BID) | ORAL | 2 refills | Status: DC

## 2018-12-25 MED ORDER — RENA-VITE RX 1 MG PO TABS: 1.0000 | ORAL_TABLET | Freq: Every day | ORAL | 2 refills | Status: DC

## 2019-02-02 DIAGNOSIS — Z0271 Encounter for disability determination: Secondary | ICD-10-CM

## 2019-02-03 ENCOUNTER — Encounter: Payer: BC Managed Care – PPO | Attending: Psychology | Admitting: Psychology

## 2019-02-12 ENCOUNTER — Encounter: Payer: BC Managed Care – PPO | Admitting: Family

## 2019-02-17 ENCOUNTER — Ambulatory Visit: Payer: BC Managed Care – PPO | Admitting: Adult Health

## 2019-03-03 ENCOUNTER — Ambulatory Visit: Payer: BC Managed Care – PPO | Admitting: Podiatry

## 2019-03-05 ENCOUNTER — Other Ambulatory Visit: Payer: Self-pay

## 2019-03-05 ENCOUNTER — Encounter: Payer: Self-pay | Admitting: Family Medicine

## 2019-03-05 ENCOUNTER — Ambulatory Visit (INDEPENDENT_AMBULATORY_CARE_PROVIDER_SITE_OTHER): Payer: BC Managed Care – PPO | Admitting: Family Medicine

## 2019-03-05 ENCOUNTER — Ambulatory Visit: Payer: Medicare Other

## 2019-03-05 VITALS — BP 140/80 | HR 7 | Temp 98.7°F | Ht 65.0 in | Wt 130.0 lb

## 2019-03-05 DIAGNOSIS — W19XXXA Unspecified fall, initial encounter: Secondary | ICD-10-CM | POA: Diagnosis not present

## 2019-03-05 DIAGNOSIS — M25649 Stiffness of unspecified hand, not elsewhere classified: Secondary | ICD-10-CM

## 2019-03-05 DIAGNOSIS — R1111 Vomiting without nausea: Secondary | ICD-10-CM

## 2019-03-05 DIAGNOSIS — M79642 Pain in left hand: Secondary | ICD-10-CM

## 2019-03-05 DIAGNOSIS — M79641 Pain in right hand: Secondary | ICD-10-CM

## 2019-03-05 DIAGNOSIS — R0981 Nasal congestion: Secondary | ICD-10-CM

## 2019-03-05 DIAGNOSIS — R05 Cough: Secondary | ICD-10-CM

## 2019-03-05 DIAGNOSIS — R053 Chronic cough: Secondary | ICD-10-CM

## 2019-03-05 DIAGNOSIS — Y92009 Unspecified place in unspecified non-institutional (private) residence as the place of occurrence of the external cause: Secondary | ICD-10-CM

## 2019-03-05 MED ORDER — FLUTICASONE PROPIONATE 50 MCG/ACT NA SUSP: 1.0000 | Freq: Every day | NASAL | 3 refills | Status: AC

## 2019-03-05 NOTE — Patient Instructions (Addendum)
Let me know f you change your mind with home physical therapy evaluation. I am concerned about your falls.   Try flonase nasal spray for nasal congestion as that may be causing some drainage and then vomiting.  If any return of vomiting, or any associated fever, nausea, diarrhea or abdominal pain be seen right away. Recheck in 3 weeks. I also ordered chest xray.   X-ray was ordered for your  fingers, but I suspect some arthritis in those joints.  Tylenol on occasion is okay for now. Can recheck those areas in 2 weeks as well.   Fall Prevention in the Home, Adult Falls can cause injuries and can affect people from all age groups. There are many simple things that you can do to make your home safe and to help prevent falls. Ask for help when making these changes, if needed. What actions can I take to prevent falls? General instructions  Use good lighting in all rooms. Replace any light bulbs that burn out.  Turn on lights if it is dark. Use night-lights.  Place frequently used items in easy-to-reach places. Lower the shelves around your home if necessary.  Set up furniture so that there are clear paths around it. Avoid moving your furniture around.  Remove throw rugs and other tripping hazards from the floor.  Avoid walking on wet floors.  Fix any uneven floor surfaces.  Add color or contrast paint or tape to grab bars and handrails in your home. Place contrasting color strips on the first and last steps of stairways.  When you use a stepladder, make sure that it is completely opened and that the sides are firmly locked. Have someone hold the ladder while you are using it. Do not climb a closed stepladder.  Be aware of any and all pets. What can I do in the bathroom?      Keep the floor dry. Immediately clean up any water that spills onto the floor.  Remove soap buildup in the tub or shower on a regular basis.  Use non-skid mats or decals on the floor of the tub or  shower.  Attach bath mats securely with double-sided, non-slip rug tape.  If you need to sit down while you are in the shower, use a plastic, non-slip stool.  Install grab bars by the toilet and in the tub and shower. Do not use towel bars as grab bars. What can I do in the bedroom?  Make sure that a bedside light is easy to reach.  Do not use oversized bedding that drapes onto the floor.  Have a firm chair that has side arms to use for getting dressed. What can I do in the kitchen?  Clean up any spills right away.  If you need to reach for something above you, use a sturdy step stool that has a grab bar.  Keep electrical cables out of the way.  Do not use floor polish or wax that makes floors slippery. If you must use wax, make sure that it is non-skid floor wax. What can I do in the stairways?  Do not leave any items on the stairs.  Make sure that you have a light switch at the top of the stairs and the bottom of the stairs. Have them installed if you do not have them.  Make sure that there are handrails on both sides of the stairs. Fix handrails that are broken or loose. Make sure that handrails are as long as the stairways.  Install non-slip stair treads on all stairs in your home.  Avoid having throw rugs at the top or bottom of stairways, or secure the rugs with carpet tape to prevent them from moving.  Choose a carpet design that does not hide the edge of steps on the stairway.  Check any carpeting to make sure that it is firmly attached to the stairs. Fix any carpet that is loose or worn. What can I do on the outside of my home?  Use bright outdoor lighting.  Regularly repair the edges of walkways and driveways and fix any cracks.  Remove high doorway thresholds.  Trim any shrubbery on the main path into your home.  Regularly check that handrails are securely fastened and in good repair. Both sides of any steps should have handrails.  Install guardrails along  the edges of any raised decks or porches.  Clear walkways of debris and clutter, including tools and rocks.  Have leaves, snow, and ice cleared regularly.  Use sand or salt on walkways during winter months.  In the garage, clean up any spills right away, including grease or oil spills. What other actions can I take?  Wear closed-toe shoes that fit well and support your feet. Wear shoes that have rubber soles or low heels.  Use mobility aids as needed, such as canes, walkers, scooters, and crutches.  Review your medicines with your health care provider. Some medicines can cause dizziness or changes in blood pressure, which increase your risk of falling. Talk with your health care provider about other ways that you can decrease your risk of falls. This may include working with a physical therapist or trainer to improve your strength, balance, and endurance. Where to find more information  Centers for Disease Control and Prevention, [facility]AD[employer]: WebmailGuide.co.za  [employer] on Aging: BrainJudge.co.uk Contact a health care provider if:  You are afraid of falling at home.  You feel weak, drowsy, or dizzy at home.  You fall at home. Summary  There are many simple things that you can do to make your home safe and to help prevent falls.  Ways to make your home safe include removing tripping hazards and installing grab bars in the bathroom.  Ask for help when making these changes in your home. This information is not intended to replace advice given to you by your health care provider. Make sure you discuss any questions you have with your health care provider. Document Revised: 01/04/2017 Document Reviewed: 09/06/2016 Elsevier Patient Education  [employer].    If you have lab work done today you will be contacted with your lab results within the next 2 weeks.  If you have not heard from [place] then please contact us. The fastest way to get your results is to  register for My Chart.   IF you received an x-ray today, you will receive an invoice from [facility]. Please contact [facility] at [phone] with questions or concerns regarding your invoice.   IF you received labwork today, you will receive an invoice from [facility]. Please contact [facility] at [phone] with questions or concerns regarding your invoice.   Our billing staff will not be able to assist you with questions regarding bills from these companies.  You will be contacted with the lab results as soon as they are available. The fastest way to get your results is to activate your My Chart account. Instructions are located on the last page of this paperwork. If you have not heard from  [place] regarding the results in 2 weeks, please contact this office.

## 2019-03-05 NOTE — Progress Notes (Signed)
Subjective:  Patient ID: Jeffery Young, male    DOB: January 07, 1966  Age: 54 y.o. MRN: [id number]  CC:  Chief Complaint  Patient presents with  . Hand Pain    pt states his Fingers are soure on both hands, and pt cant make a full finst on either hand. pt cant fully close his L index finger or his R middle finger. started a few months ago stated the pt. pt also states nothing has happened to triger this condition.    HPI Jeffery Young presents for   Here with daughter - providing some history.   Bilateral hand pain: Notes symptoms have occurred over the last few months.  Trouble closing his left index and right middle finger, no known injury, no change in activity. No new hobbies. R hand dominant.   Falls at home.  Fell 3 times last week - feet got crossed. Using walker, but feet got tangled. No syncope/near syncope.  Hurt R elbow, but pain resolved quickly. No other injuries - using elbow normally now.  Denies new unsteadiness/balance issues. Weak on days after dialysis.  No new weakness. Drags left leg - chronic since CVA. Physical therapy has been discussed in past. Declines again today, and refuses home safety eval.   At end of visit - notes having episodes of vomiting Noted 2 weeks ago initially 2 episodes total. Last episode 2 days ago. Marland Kitchen No preceeding nausea. No abd pain. No fever.  No diarrhea, not constipated.  Chronic cough, no recent changes. Productive cough for years. Noted since quit smoking. Last CXR - 1 view in 11/2018.  Denies posttussive emesis.  Some chronic nasal congestion, possibly swallowing mucus.  No heartburn.     History Patient Active Problem List   Diagnosis Date Noted  . COPD GOLD III  10/24/2017  . CAD (coronary artery disease), native coronary artery 10/10/2017  . ESRD (end stage renal disease) on dialysis ([place]) 06/25/2017  . Uncontrolled hypertension 05/28/2017  . Depression 05/28/2017  . Normocytic anemia 05/28/2017  . History of completed  stroke 05/28/2017  . Syncope 03/13/2016  . Ischemic cardiomyopathy 03/13/2016  . Unstable angina pectoris ([place]) 01/11/2016  . Hypertensive heart disease with heart failure ([place])   . Chronic combined systolic and diastolic heart failure ([place])   . Elevated troponin   . Chest pain 01/10/2016  . Coronary artery disease due to lipid rich plaque 01/10/2016  . Status post coronary artery stent placement   . Type 2 diabetes mellitus with diabetic nephropathy, without long-term current use of insulin ([place])   . Tobacco abuse   . Essential hypertension   . Hypercholesteremia   . NSTEMI (non-ST elevated myocardial infarction) ([place]) 12/22/2015  . Lightheadedness 08/22/2012  . Dehydration 08/22/2012   Past Medical History:  Diagnosis Date  . Chest pain 01/10/2016  . Chronic combined systolic and diastolic heart failure ([place])   . Coronary artery disease    a.12/22/15: NSTEMI s/p overlapping DES x2 and balloon angioplasty to distal AV groove Circumflex (too small for a stent).   . Coronary artery disease due to lipid rich plaque 01/10/2016  . Dehydration 08/22/2012  . Depression   . Elevated troponin   . ESRD (end stage renal disease) on dialysis ([place])   . Essential hypertension   . History of completed stroke 05/28/2017  . Hypercholesteremia   . Hypertension   . Hypertensive heart disease with heart failure ([place])   . Ischemic cardiomyopathy 03/13/2016  . Normocytic anemia 05/28/2017  . NSTEMI (non-ST  elevated myocardial infarction) ([place]) 12/22/2015  . Prostate infection   . Status post coronary artery stent placement   . Syncope 03/13/2016  . Tobacco abuse   . Type 2 diabetes mellitus with diabetic nephropathy, without long-term current use of insulin ([place])   . Type II diabetes mellitus ([place])   . Uncontrolled hypertension 05/28/2017  . Unstable angina pectoris ([place]) 01/11/2016   Past Surgical History:  Procedure Laterality Date  . BASCILIC VEIN TRANSPOSITION Left 06/28/2017   Procedure: LEFT  UPPER ARM ARTERIOVENOUS GORTEX-GRAFT PLACEMENT;  Surgeon: Angelia Mould, MD;  Location: [facility];  Service: Vascular;  Laterality: Left;  . CARDIAC CATHETERIZATION N/A 12/22/2015   Procedure: Left Heart Cath and Coronary Angiography;  Surgeon: Burnell Blanks, MD;  Location: [facility] [facility];  Service: Cardiovascular;  Laterality: N/A;  . CARDIAC CATHETERIZATION N/A 12/22/2015   Procedure: Coronary Stent Intervention;  Surgeon: Burnell Blanks, MD;  Location: [facility];  Service: Cardiovascular;  Laterality: N/A;  . CARDIAC CATHETERIZATION N/A 01/11/2016   Procedure: Left Heart Cath and Coronary Angiography;  Surgeon: Sherren Mocha, MD;  Location: [facility];  Service: Cardiovascular;  Laterality: N/A;  . CARDIAC CATHETERIZATION N/A 01/11/2016   Procedure: Intravascular Pressure Wire/FFR Study;  Surgeon: Sherren Mocha, MD;  Location: [facility];  Service: Cardiovascular;  Laterality: N/A;  . CARDIAC CATHETERIZATION N/A 01/11/2016   Procedure: Coronary Stent Intervention;  Surgeon: Sherren Mocha, MD;  Location: [facility];  Service: Cardiovascular;  Laterality: N/A;  . CORONARY ANGIOPLASTY WITH STENT PLACEMENT  12/22/2015  . INSERTION OF DIALYSIS CATHETER Right 06/28/2017   Procedure: INSERTION OF DIALYSIS CATHETER RIGHT INTERNAL JUGULAR;  Surgeon: Angelia Mould, MD;  Location: [facility];  Service: Vascular;  Laterality: Right;   No Known Allergies Prior to Admission medications   Medication Sig Start Date End Date Taking? Authorizing Provider  aspirin 81 MG chewable tablet CHEW ONE TABLET BY MOUTH DAILY 10/31/18  Yes [name], MD  atorvastatin (LIPITOR) 10 MG tablet Take 1 tablet (10 mg total) by mouth daily. 06/10/18  Yes Imogene Burn, PA-C  B Complex-C-Folic Acid (RENA-VITE RX) 1 MG TABS TAKE ONE TABLET BY MOUTH EVERY NIGHT AT BEDTIME 12/29/18  Yes [name], MD  B Complex-C-Folic Acid (RENA-VITE RX) 1 MG TABS Take 1  tablet by mouth at bedtime. 12/25/18  Yes [name], MD  calcitRIOL (ROCALTROL) 0.5 MCG capsule TAKE 1 CAPSULE BY MOUTH ON MONDAY, WEDNESDAY, AND FRIDAY WITH HEMODIALYSIS 03/17/18  Yes [name], MD  carvedilol (COREG) 6.25 MG tablet Take 1 tablet (6.25 mg total) by mouth 2 (two) times daily with a meal. 06/10/18  Yes Imogene Burn, PA-C  clopidogrel (PLAVIX) 75 MG tablet TAKE ONE TABLET BY MOUTH DAILY 07/08/18  Yes Burnell Blanks, MD  docusate sodium (STOOL SOFTENER) 100 MG capsule Take 1 capsule (100 mg total) by mouth 2 (two) times daily. 12/25/18  Yes [name], MD  glucose blood [facility] VERIO) test strip 1 each by Other route as needed for other. Use as instructed 1-2 times per day. 04/24/18  Yes [name], MD  Lancets [facility] DELICA PLUS [id number]) MISC USE ONELANCET TO TEST - FOUR TIMES A DAY 08/29/18  Yes [name], MD  nitroGLYCERIN (NITROSTAT) 0.4 MG SL tablet Place 1 tablet (0.4 mg total) under the tongue every 5 (five) minutes x 3 doses as needed for chest pain. 12/26/15  Yes Cheryln Manly,  NP  sertraline (ZOLOFT) 100 MG tablet Take 1 tablet (100 mg total) by mouth daily. 11/27/18  Yes [name], MD  sevelamer carbonate (RENVELA) 800 MG tablet Take 1 tablet (800 mg total) by mouth 3 (three) times daily with meals. 08/15/17  Yes [name], MD  STOOL SOFTENER 100 MG capsule TAKE ONE CAPSULE BY MOUTH TWICE A DAY 12/29/18  Yes [name], MD  traZODone (DESYREL) 50 MG tablet Take 1 tablet (50 mg total) by mouth at bedtime. 11/27/18  Yes [name], MD   Social History   Socioeconomic History  . Marital status: Married    Spouse name: Not on file  . Number of children: Not on file  . Years of education: Not on file  . Highest education level: Not on file  Occupational History  . Occupation: International aid/development worker: [employer]  Tobacco Use  . Smoking status: Former Smoker    Packs/day: 0.75     Years: 29.00    Pack years: 21.75    Types: Cigarettes    Quit date: 05/28/2017    Years since quitting: 1.7  . Smokeless tobacco: Never Used  Substance and Sexual Activity  . Alcohol use: Not Currently    Alcohol/week: 7.0 standard drinks    Types: 7 Cans of beer per week    Comment: last drink was prior to last hospitalization  . Drug use: No  . Sexual activity: Not Currently  Other Topics Concern  . Not on file  Social History Narrative  . Not on file   Social Determinants of Health   Financial Resource Strain:   . Difficulty of Paying Living Expenses: Not on file  Food Insecurity:   . Worried About Charity fundraiser in the Last Year: Not on file  . Ran Out of Food in the Last Year: Not on file  Transportation Needs:   . Lack of Transportation (Medical): Not on file  . Lack of Transportation (Non-Medical): Not on file  Physical Activity:   . Days of Exercise per Week: Not on file  . Minutes of Exercise per Session: Not on file  Stress:   . Feeling of Stress : Not on file  Social Connections:   . Frequency of Communication with Friends and Family: Not on file  . Frequency of Social Gatherings with Friends and Family: Not on file  . Attends Religious Services: Not on file  . Active Member of Clubs or Organizations: Not on file  . Attends Archivist Meetings: Not on file  . Marital Status: Not on file  Intimate Partner Violence:   . Fear of Current or Ex-Partner: Not on file  . Emotionally Abused: Not on file  . Physically Abused: Not on file  . Sexually Abused: Not on file    Review of Systems   Objective:   Vitals:   03/05/19 1152  BP: 140/80  Pulse: (!) 7  Temp: 98.7 F (37.1 C)  TempSrc: Temporal  SpO2: 97%  Weight: 130 lb (59 kg)  Height: 5\' 5"  (1.651 m)     Physical Exam Vitals reviewed.  Constitutional:      Appearance: He is well-developed.  HENT:     Head: Normocephalic and atraumatic.     Nose: Congestion (min on left. )  present. No rhinorrhea.     Comments: Sinuses nontender.  Eyes:     Pupils: Pupils are equal, round, and reactive to light.  Neck:     Vascular: No carotid bruit or JVD.  Cardiovascular:     Rate and Rhythm: Normal rate and regular rhythm.     Heart sounds: Normal heart sounds. No murmur.  Pulmonary:     Effort: Pulmonary effort is normal. No respiratory distress.     Breath sounds: Normal breath sounds. No wheezing, rhonchi or rales.  Abdominal:     General: There is no distension.     Tenderness: There is no abdominal tenderness. There is no guarding.  Musculoskeletal:     Comments: R index finger: Full extension, slight decreased flexion at the PIP by approximately 15 degrees compared to other joints.  Similar exam on left middle finger.  Minimal tenderness over the PIP only.  DIP full range of motion, nontender, no triggering of fingers appreciated.  Able to grip normally with other fingers.  No rash, no warmth.  Skin:    General: Skin is warm and dry.  Neurological:     Mental Status: He is alert and oriented to person, place, and time.     Comments: Requires assistance to stand. Min weight on left foot.       Assessment & Plan:  Jeffery Young is a 54 y.o. male . Stiffness of finger joint, unspecified laterality - Plan: DG Finger Index Right, DG Finger Middle Left Pain in both hands - Plan: DG Finger Index Right, DG Finger Middle Left  -No apparent triggering of fingers, possible slight prominence of PIP.  Osteoarthritis possible, check x-ray.  Tylenol as needed, recheck 2 weeks  Fall in home, initial encounter  -Physical therapy evaluation, home safety eval recommended but declined.  Fall precautions discussed with handout given.  Non-intractable vomiting without nausea, unspecified vomiting type Nasal congestion  -Potentially could have postnasal drip, triggering nausea/vomiting.  Reassuring abdominal exam, asymptomatic at present.  Trial of Flonase nasal spray, recheck 2  weeks.  ER/RTC precautions given regarding nausea/vomiting.  No orders of the defined types were placed in this encounter.  Patient Instructions   Let me know f you change your mind with home physical therapy evaluation. I am concerned about your falls.   Try flonase nasal spray for nasal congestion as that may be causing some drainage and then vomiting.  If any return of vomiting, or any associated fever, nausea, diarrhea or abdominal pain be seen right away. Recheck in 3 weeks.  X-ray was ordered for your  fingers, but I suspect some arthritis in those joints.  Tylenol on occasion is okay for now. Can recheck those areas in 2 weeks as well.   Fall Prevention in the Home, Adult Falls can cause injuries and can affect people from all age groups. There are many simple things that you can do to make your home safe and to help prevent falls. Ask for help when making these changes, if needed. What actions can I take to prevent falls? General instructions  Use good lighting in all rooms. Replace any light bulbs that burn out.  Turn on lights if it is dark. Use night-lights.  Place frequently used items in easy-to-reach places. Lower the shelves around your home if necessary.  Set up furniture so that there are clear paths around it. Avoid moving your furniture around.  Remove throw rugs and other tripping hazards from the floor.  Avoid walking on wet floors.  Fix any uneven floor surfaces.  Add color or contrast paint or tape to grab bars and handrails in your home. Place contrasting color  strips on the first and last steps of stairways.  When you use a stepladder, make sure that it is completely opened and that the sides are firmly locked. Have someone hold the ladder while you are using it. Do not climb a closed stepladder.  Be aware of any and all pets. What can I do in the bathroom?      Keep the floor dry. Immediately clean up any water that spills onto the floor.  Remove  soap buildup in the tub or shower on a regular basis.  Use non-skid mats or decals on the floor of the tub or shower.  Attach bath mats securely with double-sided, non-slip rug tape.  If you need to sit down while you are in the shower, use a plastic, non-slip stool.  Install grab bars by the toilet and in the tub and shower. Do not use towel bars as grab bars. What can I do in the bedroom?  Make sure that a bedside light is easy to reach.  Do not use oversized bedding that drapes onto the floor.  Have a firm chair that has side arms to use for getting dressed. What can I do in the kitchen?  Clean up any spills right away.  If you need to reach for something above you, use a sturdy step stool that has a grab bar.  Keep electrical cables out of the way.  Do not use floor polish or wax that makes floors slippery. If you must use wax, make sure that it is non-skid floor wax. What can I do in the stairways?  Do not leave any items on the stairs.  Make sure that you have a light switch at the top of the stairs and the bottom of the stairs. Have them installed if you do not have them.  Make sure that there are handrails on both sides of the stairs. Fix handrails that are broken or loose. Make sure that handrails are as long as the stairways.  Install non-slip stair treads on all stairs in your home.  Avoid having throw rugs at the top or bottom of stairways, or secure the rugs with carpet tape to prevent them from moving.  Choose a carpet design that does not hide the edge of steps on the stairway.  Check any carpeting to make sure that it is firmly attached to the stairs. Fix any carpet that is loose or worn. What can I do on the outside of my home?  Use bright outdoor lighting.  Regularly repair the edges of walkways and driveways and fix any cracks.  Remove high doorway thresholds.  Trim any shrubbery on the main path into your home.  Regularly check that handrails are  securely fastened and in good repair. Both sides of any steps should have handrails.  Install guardrails along the edges of any raised decks or porches.  Clear walkways of debris and clutter, including tools and rocks.  Have leaves, snow, and ice cleared regularly.  Use sand or salt on walkways during winter months.  In the garage, clean up any spills right away, including grease or oil spills. What other actions can I take?  Wear closed-toe shoes that fit well and support your feet. Wear shoes that have rubber soles or low heels.  Use mobility aids as needed, such as canes, walkers, scooters, and crutches.  Review your medicines with your health care provider. Some medicines can cause dizziness or changes in blood pressure, which increase your risk of  falling. Talk with your health care provider about other ways that you can decrease your risk of falls. This may include working with a physical therapist or trainer to improve your strength, balance, and endurance. Where to find more information  Centers for Disease Control and Prevention, [employer]: WebmailGuide.co.za  [employer] on Aging: BrainJudge.co.uk Contact a health care provider if:  You are afraid of falling at home.  You feel weak, drowsy, or dizzy at home.  You fall at home. Summary  There are many simple things that you can do to make your home safe and to help prevent falls.  Ways to make your home safe include removing tripping hazards and installing grab bars in the bathroom.  Ask for help when making these changes in your home. This information is not intended to replace advice given to you by your health care provider. Make sure you discuss any questions you have with your health care provider. Document Revised: 01/04/2017 Document Reviewed: 09/06/2016 Elsevier Patient Education  [employer].    If you have lab work done today you will be contacted with your lab results within the  next 2 weeks.  If you have not heard from [place] then please contact us. The fastest way to get your results is to register for My Chart.   IF you received an x-ray today, you will receive an invoice from [facility]. Please contact [facility] at [phone] with questions or concerns regarding your invoice.   IF you received labwork today, you will receive an invoice from [facility]. Please contact [facility] at [phone] with questions or concerns regarding your invoice.   Our billing staff will not be able to assist you with questions regarding bills from these companies.  You will be contacted with the lab results as soon as they are available. The fastest way to get your results is to activate your My Chart account. Instructions are located on the last page of this paperwork. If you have not heard from [place] regarding the results in 2 weeks, please contact this office.         Signed, Merri Ray, MD [facility]

## 2019-03-07 ENCOUNTER — Encounter: Payer: Self-pay | Admitting: Family Medicine

## 2019-03-10 ENCOUNTER — Ambulatory Visit: Payer: BC Managed Care – PPO | Admitting: Adult Health

## 2019-03-10 ENCOUNTER — Telehealth: Payer: Self-pay | Admitting: Adult Health

## 2019-03-10 NOTE — Progress Notes (Deleted)
[facility] [address] [place]. [place] [address] [phone]       FOLLOW UP NOTE  [name] Date of Birth:  [birth year] Medical Record Number:  [id number]   Referring MD:  Jamison Neighbor Reason for visit: Stroke     HPI:   Initial visit 10/15/2017 : Jeffery Young is a 54 y.o. with PMH of COPD, CAD s/p stents, ESRD on HD, HTN, depression, chronic combined systolic and diastolic heart failure, prior tobacco use, HLD and DM.  He complained of slurred speech, gait and balance difficulties and underwent CT head on 07/04/2017 which showed small right pontine and bilateral basal ganglia lacunar infarcts with mild degree of generalized cerebral atrophy.  He was initially evaluated in our office by Dr Leonie Man on 10/15/2017 with residual gait difficulty with use of rolling walker.  No further work-up has been done prior to initial office visit.  Recommended to complete stroke work-up with MRI brain on 11/20/2017 which showed remote H bilateral basal ganglia, thalamic and brainstem infarcts.  MRA of neck unremarkable.  MRA head showed diffuse intracranial arthrosclerotic changes in anterior circulation.  2D echo unremarkable.  Cardiac event monitoring negative for A. Fib.  At follow-up visit on 12/26/2017 he continues to have residual gait difficulties and new complaints of memory difficulties which she has been experiencing since her stroke.  Virtual update 06/26/2018: During today's visit, he continued to have gait difficulties and cognitive deficits.  He currently lives with his wife (currently separated) and adult children.  Prior to his stroke, he was living independently and working full-time but moved back in after his stroke as he needed assistance.  Wife endorses frustration with his lack of motivation to improve and states that he will only ambulate to the bathroom but then will sit back on the couch where he spends most of the day.  He was previously receiving home therapy  but due to his lack of participation, he was discharged from services.  Over the past few months, he has become more incontinent of urine and occasionally stool.  Patient states he was previously having difficulty getting to the bathroom in time and would end up urinating prior to getting to the bathroom and therefore they started placing him in briefs.  Since he has been in briefs, he has become more incontinent and wife is unsure if this is due to "laziness".  He is aware of when he needs to void and is unable to state whether incontinence is due to "laziness" or urgency sensation.  He will occasionally be incontinent of stool but wife endorses constipation.  He uses a wheelchair for long distance.  He denies any falls with rolling walker.  He states his memory has been stable in regards to his short-term memory which wife agrees with but also believes he has long-term memory issues such as not remembering important events from prior years.  His cognitive deficit has been stable without any worsening but denies improvement.  He continues on aspirin and Plavix and atorvastatin 10 mg daily without reported side effects.  He denies new or worsening stroke/TIA symptoms. Further evaluation regarding cognition and possible underlying depression which could be relating to his lack of motivation and willingness to care for himself, incontinence and motivation.  He does sleep well throughout the night and will only nap occasionally.  Denies snoring.  He does undergo HD 3 times per week.  PHQ 9 and GAD 7 listed below but question accuracy due to variation in  answers.  Per wife, questionable underlying depression prior to his stroke after he and his wife became separated.  She has been told by his daughter that he was "drinking a lot and stayed in a dark room".  While wife was initially speaking about her frustrations and is even currently looking into a long-term care facility due to his consistent daily care needs, he was  laughing and smirking.  When she started speaking of their separation and potential underlying depression, he became tearful and very upset.  Her wife, he does not have family outside of herself and his children therefore his care relies mainly on them.  His wife feels as though he has just "given up hope" and has no desire to become independent with his daily functioning.  Attempted to question further but unable to adequately assess.  Update 10/07/2018: Jeffery Young is a 54 year old male who is being seen today for stroke follow-up accompanied by his son.  Residual deficits of left hemiparesis, mild dysarthria, gait difficulties and decreased cognition.  Patient is frustrated regarding lack of improvement as he is now nonambulatory.  Referral for home health placed at prior visit which he was agreeable to but apparently refused when they called to schedule visits.  He spends his day in a wheelchair or on the couch without any activity and decreased function is likely secondary to deconditioning.  Discussion regarding need of regular exercise and therapy in order to see improvement as he will likely continue to see worsening with current activity level.  He continues to live with his son and daughter along with his ex-wife.  Son does provide history regarding ongoing medication use as patient unable to verify.  He also states he does not remember declining home health therapy but after discussion with replacing referral for home health versus outpatient therapy, he declines interest initially.  He has continued on aspirin and Plavix without bleeding or bruising.  Continues on atorvastatin without myalgias.  Blood pressure stable at 120/68.  Zoloft dosage increased at prior visit due to depression and anxiety without ongoing concerns.  No further concerns at this time.  Update 03/10/2019: Jeffery Young is a 54 year old male who is being seen today for stroke follow-up.       ROS:   14 system review of systems is  positive for memory loss, numbness, speech difficulties and tremors and all other systems negative  PMH:  Past Medical History:  Diagnosis Date  . Chest pain 01/10/2016  . Chronic combined systolic and diastolic heart failure ([place])   . Coronary artery disease    a.12/22/15: NSTEMI s/p overlapping DES x2 and balloon angioplasty to distal AV groove Circumflex (too small for a stent).   . Coronary artery disease due to lipid rich plaque 01/10/2016  . Dehydration 08/22/2012  . Depression   . Elevated troponin   . ESRD (end stage renal disease) on dialysis ([place])   . Essential hypertension   . History of completed stroke 05/28/2017  . Hypercholesteremia   . Hypertension   . Hypertensive heart disease with heart failure ([place])   . Ischemic cardiomyopathy 03/13/2016  . Normocytic anemia 05/28/2017  . NSTEMI (non-ST elevated myocardial infarction) ([place]) 12/22/2015  . Prostate infection   . Status post coronary artery stent placement   . Syncope 03/13/2016  . Tobacco abuse   . Type 2 diabetes mellitus with diabetic nephropathy, without long-term current use of insulin ([place])   . Type II diabetes mellitus ([place])   . Uncontrolled hypertension 05/28/2017  .  Unstable angina pectoris ([place]) 01/11/2016    Social History:  Social History   Socioeconomic History  . Marital status: Married    Spouse name: Not on file  . Number of children: Not on file  . Years of education: Not on file  . Highest education level: Not on file  Occupational History  . Occupation: International aid/development worker: [employer]  Tobacco Use  . Smoking status: Former Smoker    Packs/day: 0.75    Years: 29.00    Pack years: 21.75    Types: Cigarettes    Quit date: 05/28/2017    Years since quitting: 1.7  . Smokeless tobacco: Never Used  Substance and Sexual Activity  . Alcohol use: Not Currently    Alcohol/week: 7.0 standard drinks    Types: 7 Cans of beer per week    Comment: last drink was prior to last hospitalization    . Drug use: No  . Sexual activity: Not Currently  Other Topics Concern  . Not on file  Social History Narrative  . Not on file   Social Determinants of Health   Financial Resource Strain:   . Difficulty of Paying Living Expenses: Not on file  Food Insecurity:   . Worried About Charity fundraiser in the Last Year: Not on file  . Ran Out of Food in the Last Year: Not on file  Transportation Needs:   . Lack of Transportation (Medical): Not on file  . Lack of Transportation (Non-Medical): Not on file  Physical Activity:   . Days of Exercise per Week: Not on file  . Minutes of Exercise per Session: Not on file  Stress:   . Feeling of Stress : Not on file  Social Connections:   . Frequency of Communication with Friends and Family: Not on file  . Frequency of Social Gatherings with Friends and Family: Not on file  . Attends Religious Services: Not on file  . Active Member of Clubs or Organizations: Not on file  . Attends Archivist Meetings: Not on file  . Marital Status: Not on file  Intimate Partner Violence:   . Fear of Current or Ex-Partner: Not on file  . Emotionally Abused: Not on file  . Physically Abused: Not on file  . Sexually Abused: Not on file    Medications:   Current Outpatient Medications on File Prior to Visit  Medication Sig Dispense Refill  . aspirin 81 MG chewable tablet CHEW ONE TABLET BY MOUTH DAILY 90 tablet 0  . atorvastatin (LIPITOR) 10 MG tablet Take 1 tablet (10 mg total) by mouth daily. 90 tablet 3  . B Complex-C-Folic Acid (RENA-VITE RX) 1 MG TABS TAKE ONE TABLET BY MOUTH EVERY NIGHT AT BEDTIME 30 tablet 0  . B Complex-C-Folic Acid (RENA-VITE RX) 1 MG TABS Take 1 tablet by mouth at bedtime. 30 tablet 2  . calcitRIOL (ROCALTROL) 0.5 MCG capsule TAKE 1 CAPSULE BY MOUTH ON MONDAY, WEDNESDAY, AND FRIDAY WITH HEMODIALYSIS 30 capsule 0  . carvedilol (COREG) 6.25 MG tablet Take 1 tablet (6.25 mg total) by mouth 2 (two) times daily with a meal.  180 tablet 3  . clopidogrel (PLAVIX) 75 MG tablet TAKE ONE TABLET BY MOUTH DAILY 90 tablet 1  . docusate sodium (STOOL SOFTENER) 100 MG capsule Take 1 capsule (100 mg total) by mouth 2 (two) times daily. 60 capsule 2  . fluticasone (FLONASE) 50 MCG/ACT nasal spray Place 1-2 sprays into both nostrils  daily. 16 g 3  . glucose blood (ONETOUCH VERIO) test strip 1 each by Other route as needed for other. Use as instructed 1-2 times per day. 100 each 2  . Lancets (ONETOUCH DELICA PLUS 123XX123) MISC USE ONELANCET TO TEST - FOUR TIMES A DAY 100 each 1  . nitroGLYCERIN (NITROSTAT) 0.4 MG SL tablet Place 1 tablet (0.4 mg total) under the tongue every 5 (five) minutes x 3 doses as needed for chest pain. 25 tablet 3  . sertraline (ZOLOFT) 100 MG tablet Take 1 tablet (100 mg total) by mouth daily. 90 tablet 1  . sevelamer carbonate (RENVELA) 800 MG tablet Take 1 tablet (800 mg total) by mouth 3 (three) times daily with meals. 90 tablet 1  . STOOL SOFTENER 100 MG capsule TAKE ONE CAPSULE BY MOUTH TWICE A DAY 60 capsule 0  . traZODone (DESYREL) 50 MG tablet Take 1 tablet (50 mg total) by mouth at bedtime. 90 tablet 1   No current facility-administered medications on file prior to visit.    Allergies:  No Known Allergies  There were no vitals filed for this visit. There is no height or weight on file to calculate BMI.  Physical Exam  General: well developed, well nourished,  pleasant middle-aged male, seated, in no evident distress Head: head normocephalic and atraumatic.   Neck: supple with no carotid or supraclavicular bruits Cardiovascular: regular rate and rhythm, no murmurs Musculoskeletal: no deformity Skin:  no rash/petichiae Vascular:  Normal pulses all extremities   Neurologic Exam Mental Status: Awake and fully alert.   Mild dysarthria.  Oriented to place and time. Recent and remote memory diminished. Attention span, concentration and fund of knowledge diminished. Mood and affect  appropriate.  Cranial Nerves: Pupils equal, briskly reactive to light. Extraocular movements full without nystagmus. Visual fields full to confrontation. Hearing intact. Facial sensation intact.  Left-sided facial weakness Motor:  LUE: 3+/5 with weak grip strength LLE: 4-/5 greatest in hip flexor and weak ankle dorsiflexion Full strength right upper and lower extremity Sensory.: intact to touch , pinprick , position and vibratory sensation.  Coordination: Rapid alternating diminished on left side. Finger-to-nose performed accurately bilaterally and heel-to-shin performed accurately on right side but unable to perform on left side Gait and Station: Nonambulatory sitting in wheelchair Reflexes: 1+ right side and 2+ left side. Toes downgoing.       ASSESSMENT: 54 year old male with slurred speech, gait difficulty and left-sided weakness from lacunar brainstem and  Bilateral subcortical infarcts likely from May 2019 due to small vessel disease. Vascular risk factors of hypertension, diabetes, hyperlipidemia , coronary artery disease,and smoking.  Residual deficits of left hemiparesis, gait difficulty, dysarthria and cognitive impairment.  He is now nonambulatory with potential worsening of left-sided weakness likely due to deconditioning with lack of therapy and daytime activity.  Referral placed at prior visit from home health therapies but he apparently refused participation when they called to schedule initial visit     PLAN: -Long discussion with patient regarding participation in therapies as he is overly frustrated with residual deficits and decreasing function.  Family declined home health therapy at this visit due to COVID-19 but will provide transportation to participate in outpatient PT/OT/ST -orders placed to neuro rehab  -Continue Zoloft 100 mg daily and recommend follow-up with PCP for ongoing monitoring and management -Continue aspirin and plavix for secondary stroke prevention given  h/o cardiac stents  -Continue atorvastatin 10 mg daily and ongoing follow-up with PCP -maintain strict control of hypertension with  blood pressure goal below 130/90, diabetes with hemoglobin A1c goal below 6.5% and lipids with LDL cholesterol goal below 70 mg/dL. I also advised the patient to eat a healthy diet with plenty of whole grains, cereals, fruits and vegetables, exercise regularly and maintain ideal body weight    Follow-up in 4 months or call earlier if needed  Greater than 50% of time during this 25-minute visit was spent on discussion of post stroke depression/anxiety and ongoing cognitive deficits, highly stressed the importance of participation in therapies as he desires improvement, discussion regarding residual left hemiparesis with possible deconditioning, importance of staying active, reviewing risk factor management of HTN and HLD, planning of further management, discussion with patient and family and coordination of care    Frann Rider, [facility]  [facility] [address] [place], [place] [address]  Phone [phone] Fax [phone] Note: This document was prepared with digital dictation and possible smart phrase technology. Any transcriptional errors that result from this process are unintentional.

## 2019-03-10 NOTE — Telephone Encounter (Signed)
Patient was 15 minutes late for apt today and resch for March.They could not come in tomorrow for an open apt in the afternoon. Patient's wife, Marval Regal, would like a call back from nurse or NP regarding needing MRI scheduled. Best call back [phone]

## 2019-03-11 ENCOUNTER — Encounter: Payer: Self-pay | Admitting: Surgery

## 2019-03-11 NOTE — Telephone Encounter (Signed)
I called spoke to wife.  She fees like pt needs to be sooner then March.  She states that he has no motivation, not progressing,  Having issues with cognition/memory, sometimes feels like he spaces out.   Sx more so since last few weeks.  Refuses to go to PT. Had fall last week.  She would like to have MRI.  Has dialysis MWF,  Was able to move appt to Thursday at 1545, arrive 1515, son to bring.  She will write her concerns to bring with them.

## 2019-03-12 ENCOUNTER — Other Ambulatory Visit: Payer: Self-pay

## 2019-03-12 ENCOUNTER — Encounter: Payer: Self-pay | Admitting: Adult Health

## 2019-03-12 ENCOUNTER — Ambulatory Visit (INDEPENDENT_AMBULATORY_CARE_PROVIDER_SITE_OTHER): Payer: BC Managed Care – PPO | Admitting: Adult Health

## 2019-03-12 VITALS — BP 136/80 | HR 72 | Temp 97.3°F | Ht 65.0 in | Wt 122.0 lb

## 2019-03-12 DIAGNOSIS — E785 Hyperlipidemia, unspecified: Secondary | ICD-10-CM

## 2019-03-12 DIAGNOSIS — I69398 Other sequelae of cerebral infarction: Secondary | ICD-10-CM

## 2019-03-12 DIAGNOSIS — F0631 Mood disorder due to known physiological condition with depressive features: Secondary | ICD-10-CM

## 2019-03-12 DIAGNOSIS — I6930 Unspecified sequelae of cerebral infarction: Secondary | ICD-10-CM | POA: Diagnosis not present

## 2019-03-12 DIAGNOSIS — I69319 Unspecified symptoms and signs involving cognitive functions following cerebral infarction: Secondary | ICD-10-CM

## 2019-03-12 DIAGNOSIS — I10 Essential (primary) hypertension: Secondary | ICD-10-CM

## 2019-03-12 DIAGNOSIS — R5381 Other malaise: Secondary | ICD-10-CM

## 2019-03-12 NOTE — Patient Instructions (Signed)
No concerning findings during today's visit.  Some mild cognitive impairment but also appears to have moderate to severe depression/anxiety which can contribute to cognitive impairment, being extremely fatigued and lack of motivation.  We have had conversations in the past regarding importance of participating with therapies with orders placed at prior visit but it appears as though these were refused back in September as he was going to be placed in a facility.  Lack of physical therapy and routine exercise can easily cause deconditioning which is likely the cause of his falls.  I highly encourage participation with therapist as recommended by Dr. Carlota Raspberry -he did leave a message on my chart providing office phone number.  Continue aspirin 81 mg daily and clopidogrel 75 mg daily  and lipitor  for secondary stroke prevention  Continue to follow up with PCP regarding cholesterol and blood pressure management   Continue to monitor blood pressure at home  Maintain strict control of hypertension with blood pressure goal below 130/90, diabetes with hemoglobin A1c goal below 6.5% and cholesterol with LDL cholesterol (bad cholesterol) goal below 70 mg/dL. I also advised the patient to eat a healthy diet with plenty of whole grains, cereals, fruits and vegetables, exercise regularly and maintain ideal body weight.  Followup in the future with me in 6 months or call earlier if needed       Thank you for coming to see [place] at [facility]. I hope we have been able to provide you high quality care today.  You may receive a patient satisfaction survey over the next few weeks. We would appreciate your feedback and comments so that we may continue to improve ourselves and the health of our patients.

## 2019-03-12 NOTE — Progress Notes (Signed)
[facility] [address] [place]. [place] [address] [phone]       FOLLOW UP NOTE  Jeffery Young Date of Birth:  01-18-1966 Medical Record Number:  [id number]   Referring MD:  Jamison Neighbor Reason for visit: Stroke     HPI:   Initial visit 10/15/2017 : Jeffery Young is a 54 y.o. with PMH of COPD, CAD s/p stents, ESRD on HD, HTN, depression, chronic combined systolic and diastolic heart failure, prior tobacco use, HLD and DM.  He complained of slurred speech, gait and balance difficulties and underwent CT head on 07/04/2017 which showed small right pontine and bilateral basal ganglia lacunar infarcts with mild degree of generalized cerebral atrophy.  He was initially evaluated in our office by Dr Leonie Man on 10/15/2017 with residual gait difficulty with use of rolling walker.  No further work-up has been done prior to initial office visit.  Recommended to complete stroke work-up with MRI brain on 11/20/2017 which showed remote H bilateral basal ganglia, thalamic and brainstem infarcts.  MRA of neck unremarkable.  MRA head showed diffuse intracranial arthrosclerotic changes in anterior circulation.  2D echo unremarkable.  Cardiac event monitoring negative for A. Fib.  At follow-up visit on 12/26/2017 he continues to have residual gait difficulties and new complaints of memory difficulties which she has been experiencing since her stroke.  Virtual update 06/26/2018: During today's visit, he continued to have gait difficulties and cognitive deficits.  He currently lives with his wife (currently separated) and adult children.  Prior to his stroke, he was living independently and working full-time but moved back in after his stroke as he needed assistance.  Wife endorses frustration with his lack of motivation to improve and states that he will only ambulate to the bathroom but then will sit back on the couch where he spends most of the day.  He was previously receiving home therapy  but due to his lack of participation, he was discharged from services.  Over the past few months, he has become more incontinent of urine and occasionally stool.  Patient states he was previously having difficulty getting to the bathroom in time and would end up urinating prior to getting to the bathroom and therefore they started placing him in briefs.  Since he has been in briefs, he has become more incontinent and wife is unsure if this is due to "laziness".  He is aware of when he needs to void and is unable to state whether incontinence is due to "laziness" or urgency sensation.  He will occasionally be incontinent of stool but wife endorses constipation.  He uses a wheelchair for long distance.  He denies any falls with rolling walker.  He states his memory has been stable in regards to his short-term memory which wife agrees with but also believes he has long-term memory issues such as not remembering important events from prior years.  His cognitive deficit has been stable without any worsening but denies improvement.  He continues on aspirin and Plavix and atorvastatin 10 mg daily without reported side effects.  He denies new or worsening stroke/TIA symptoms. Further evaluation regarding cognition and possible underlying depression which could be relating to his lack of motivation and willingness to care for himself, incontinence and motivation.  He does sleep well throughout the night and will only nap occasionally.  Denies snoring.  He does undergo HD 3 times per week.  PHQ 9 and GAD 7 listed below but question accuracy due to variation in  answers.  Per wife, questionable underlying depression prior to his stroke after he and his wife became separated.  She has been told by his daughter that he was "drinking a lot and stayed in a dark room".  While wife was initially speaking about her frustrations and is even currently looking into a long-term care facility due to his consistent daily care needs, he was  laughing and smirking.  When she started speaking of their separation and potential underlying depression, he became tearful and very upset.  Her wife, he does not have family outside of herself and his children therefore his care relies mainly on them.  His wife feels as though he has just "given up hope" and has no desire to become independent with his daily functioning.  Attempted to question further but unable to adequately assess.  Update 10/07/2018: Jeffery Young is a 54 year old male who is being seen today for stroke follow-up accompanied by his son.  Residual deficits of left hemiparesis, mild dysarthria, gait difficulties and decreased cognition.  Patient is frustrated regarding lack of improvement as he is now nonambulatory.  Referral for home health placed at prior visit which he was agreeable to but apparently refused when they called to schedule visits.  He spends his day in a wheelchair or on the couch without any activity and decreased function is likely secondary to deconditioning.  Discussion regarding need of regular exercise and therapy in order to see improvement as he will likely continue to see worsening with current activity level.  He continues to live with his son and daughter along with his ex-wife.  Son does provide history regarding ongoing medication use as patient unable to verify.  He also states he does not remember declining home health therapy but after discussion with replacing referral for home health versus outpatient therapy, he declines interest initially.  He has continued on aspirin and Plavix without bleeding or bruising.  Continues on atorvastatin without myalgias.  Blood pressure stable at 120/68.  Zoloft dosage increased at prior visit due to depression and anxiety without ongoing concerns.  No further concerns at this time.  Update 03/11/2019: Jeffery Young is a 54 year old male who is being seen today for stroke follow-up accompanied by his son. Recent concerns of increased  fatigue, falls, short term memory loss and decreased motivation. After prior visit, order placed to participate in PT/OT/ST but per epic review, when neuro rehab called ex wife to reschedule evaluations, therapy was declined as she was pursuing facility placement.  Recent falls appear to be mechanical falls without injury.  Continues to use walker for ambulation.  He does endorse ongoing/increased depression and anxiety and has had multiple conversations regarding this with his PCP.  At prior visits, he was provided with counseling/therapy office number to schedule evaluation but has not done so at this time.  He does admit to sedentary lifestyle without routine exercise and lack of activity which was discussed at length at multiple prior visits.  Currently on sertraline 100 mg daily for depression and trazodone due to insomnia.  Concerns regarding memory loss has also been discussed at prior visits and patient denies any worsening but has not improved.  MMSE today 21/30.  Son also endorses stiffness of middle finger bilaterally which he was evaluated by PCP and has current orders for x-ray but has not been done at this time. Son questions if an MRI is needed due to finger stiffness and symptoms as noted above. Also observed hoarse non productive cough frequently  during visit and when questioned, son stated it was a chronic "smokers cough" and has had it for years. He continues on plavix, aspirin and atorvastatin for secondary stroke prevention without side effects.  Blood pressure today 136/80.  No further concerns at this time.     ROS:   14 system review of systems is positive for memory loss, depression, anxiety, finger stiffness, gait difficulty and all other systems negative  PMH:  Past Medical History:  Diagnosis Date  . Chest pain 01/10/2016  . Chronic combined systolic and diastolic heart failure ([place])   . Coronary artery disease    a.12/22/15: NSTEMI s/p overlapping DES x2 and balloon  angioplasty to distal AV groove Circumflex (too small for a stent).   . Coronary artery disease due to lipid rich plaque 01/10/2016  . Dehydration 08/22/2012  . Depression   . Elevated troponin   . ESRD (end stage renal disease) on dialysis ([place])   . Essential hypertension   . History of completed stroke 05/28/2017  . Hypercholesteremia   . Hypertension   . Hypertensive heart disease with heart failure ([place])   . Ischemic cardiomyopathy 03/13/2016  . Normocytic anemia 05/28/2017  . NSTEMI (non-ST elevated myocardial infarction) ([place]) 12/22/2015  . Prostate infection   . Status post coronary artery stent placement   . Syncope 03/13/2016  . Tobacco abuse   . Type 2 diabetes mellitus with diabetic nephropathy, without long-term current use of insulin ([place])   . Type II diabetes mellitus ([place])   . Uncontrolled hypertension 05/28/2017  . Unstable angina pectoris ([place]) 01/11/2016    Social History:  Social History   Socioeconomic History  . Marital status: Married    Spouse name: Not on file  . Number of children: Not on file  . Years of education: Not on file  . Highest education level: Not on file  Occupational History  . Occupation: International aid/development worker: [employer]  Tobacco Use  . Smoking status: Former Smoker    Packs/day: 0.75    Years: 29.00    Pack years: 21.75    Types: Cigarettes    Quit date: 05/28/2017    Years since quitting: 1.7  . Smokeless tobacco: Never Used  Substance and Sexual Activity  . Alcohol use: Not Currently    Alcohol/week: 7.0 standard drinks    Types: 7 Cans of beer per week    Comment: last drink was prior to last hospitalization  . Drug use: No  . Sexual activity: Not Currently  Other Topics Concern  . Not on file  Social History Narrative  . Not on file   Social Determinants of Health   Financial Resource Strain:   . Difficulty of Paying Living Expenses: Not on file  Food Insecurity:   . Worried About Charity fundraiser in the Last  Year: Not on file  . Ran Out of Food in the Last Year: Not on file  Transportation Needs:   . Lack of Transportation (Medical): Not on file  . Lack of Transportation (Non-Medical): Not on file  Physical Activity:   . Days of Exercise per Week: Not on file  . Minutes of Exercise per Session: Not on file  Stress:   . Feeling of Stress : Not on file  Social Connections:   . Frequency of Communication with Friends and Family: Not on file  . Frequency of Social Gatherings with Friends and Family: Not on file  . Attends Religious Services:  Not on file  . Active Member of Clubs or Organizations: Not on file  . Attends Archivist Meetings: Not on file  . Marital Status: Not on file  Intimate Partner Violence:   . Fear of Current or Ex-Partner: Not on file  . Emotionally Abused: Not on file  . Physically Abused: Not on file  . Sexually Abused: Not on file    Medications:   Current Outpatient Medications on File Prior to Visit  Medication Sig Dispense Refill  . aspirin 81 MG chewable tablet CHEW ONE TABLET BY MOUTH DAILY 90 tablet 0  . atorvastatin (LIPITOR) 10 MG tablet Take 1 tablet (10 mg total) by mouth daily. 90 tablet 3  . B Complex-C-Folic Acid (RENA-VITE RX) 1 MG TABS TAKE ONE TABLET BY MOUTH EVERY NIGHT AT BEDTIME 30 tablet 0  . calcitRIOL (ROCALTROL) 0.5 MCG capsule TAKE 1 CAPSULE BY MOUTH ON MONDAY, WEDNESDAY, AND FRIDAY WITH HEMODIALYSIS 30 capsule 0  . carvedilol (COREG) 6.25 MG tablet Take 1 tablet (6.25 mg total) by mouth 2 (two) times daily with a meal. 180 tablet 3  . clopidogrel (PLAVIX) 75 MG tablet TAKE ONE TABLET BY MOUTH DAILY 90 tablet 1  . docusate sodium (STOOL SOFTENER) 100 MG capsule Take 1 capsule (100 mg total) by mouth 2 (two) times daily. 60 capsule 2  . fluticasone (FLONASE) 50 MCG/ACT nasal spray Place 1-2 sprays into both nostrils daily. 16 g 3  . glucose blood (ONETOUCH VERIO) test strip 1 each by Other route as needed for other. Use as instructed  1-2 times per day. 100 each 2  . Lancets (ONETOUCH DELICA PLUS 123XX123) MISC USE ONELANCET TO TEST - FOUR TIMES A DAY 100 each 1  . nitroGLYCERIN (NITROSTAT) 0.4 MG SL tablet Place 1 tablet (0.4 mg total) under the tongue every 5 (five) minutes x 3 doses as needed for chest pain. 25 tablet 3  . sertraline (ZOLOFT) 100 MG tablet Take 1 tablet (100 mg total) by mouth daily. 90 tablet 1  . sevelamer carbonate (RENVELA) 800 MG tablet Take 1 tablet (800 mg total) by mouth 3 (three) times daily with meals. 90 tablet 1  . STOOL SOFTENER 100 MG capsule TAKE ONE CAPSULE BY MOUTH TWICE A DAY 60 capsule 0  . traZODone (DESYREL) 50 MG tablet Take 1 tablet (50 mg total) by mouth at bedtime. 90 tablet 1   No current facility-administered medications on file prior to visit.    Allergies:  No Known Allergies  Today's Vitals   03/12/19 1528  BP: 136/80  Pulse: 72  Temp: (!) 97.3 F (36.3 C)  Weight: 122 lb (55.3 kg)  Height: 5\' 5"  (1.651 m)   Body mass index is 20.3 kg/m.  Physical Exam  General: well developed, well nourished,  pleasant middle-aged male, seated, in no evident distress Head: head normocephalic and atraumatic.   Neck: supple with no carotid or supraclavicular bruits Cardiovascular: regular rate and rhythm, no murmurs Musculoskeletal: no deformity Skin:  no rash/petichiae Vascular:  Normal pulses all extremities   Neurologic Exam Mental Status: Awake and fully alert. Mild dysarthria - stable from prior visits with possible improvement.  Oriented to place and time. Recent and remote memory diminished. Attention span, concentration and fund of knowledge mildly diminished but able to answer questions appropriately. Mood and affect appropriate.  MMSE - Mini Mental State Exam 03/12/2019  Orientation to time 0  Orientation to Place 3  Registration 3  Attention/ Calculation 3  Recall 3  Language- name 2 objects 2  Language- repeat 1  Language- follow 3 step command 3  Language-  read & follow direction 1  Write a sentence 1  Copy design 1  Copy design-comments named 6 animals  Total score 21   Cranial Nerves: Pupils equal, briskly reactive to light. Extraocular movements full without nystagmus. Visual fields full to confrontation. Hearing intact. Facial sensation intact.  Mild Left-sided facial weakness Motor:  LUE: 4/5 with weak grip strength - improved since prior visit LLE: 4-/5 greatest in hip flexor and weak ankle dorsiflexion - stable since prior visit Full strength right upper and lower extremity Sensory.: intact to touch , pinprick , position and vibratory sensation.  Coordination: Rapid alternating diminished on left side. Finger-to-nose performed accurately bilaterally and heel-to-shin performed accurately on right side but unable to perform on left side Gait and Station: deferred as RW not present at appt - currently in w/c Reflexes: 1+ right side and 2+ left side. Toes downgoing.       ASSESSMENT: 54 year old male with slurred speech, gait difficulty, mild cognitive impairment and left-sided weakness from lacunar brainstem and  Bilateral subcortical infarcts likely from small vessel disease in May 2019. Vascular risk factors of hypertension, diabetes, hyperlipidemia , coronary artery disease,and history of smoking.  Residual deficits of left hemiparesis, gait difficulty, dysarthria and cognitive impairment.  Multiple prior conversations regarding deconditioning, denial/lack of participation in recommended therapies and worsening underlying depression/anxiety.Unable to appreciate any differences from prior visit regarding exam or new/worsening stroke/TIA symptoms.     PLAN: -Worsening memory and increased falls are likely due to deconditioning and sedentary lifestyle with lack of physical activity or exercise.  Multiple discussions previously regarding participation in home health therapy and outpatient therapy which he agreed with during visit but once  they called to schedule initial evaluations, he declined.  He has also been recommended by PCP to be evaluated by psychiatry but he has not called to schedule initial visit.  Highly encouraged him to do so and to schedule PT/OT/ST evaluations which were previously ordered.  No indication at this time for additional testing or imaging which was discussed with son and verbalized understanding.  Again, discussed high chance of ongoing deconditioning and worsening if he continues to refuse therapies or additional help/support that is being offered.  Unfortunately, unable to determine amount of support by family as son states "he just sits there all day and does not do anything".  He may highly benefit from family therapy as well -finger pain - defer to PCP as this was previously evaluated but has not done recommended imaging  -Continue Zoloft 100 mg daily and recommend follow-up with PCP for ongoing monitoring and management -Continue aspirin and plavix for secondary stroke prevention and h/o cardiac stents  -Continue atorvastatin 10 mg daily and ongoing follow-up with PCP -maintain strict control of hypertension with blood pressure goal below 130/90, diabetes with hemoglobin A1c goal below 6.5% and lipids with LDL cholesterol goal below 70 mg/dL. I also advised the patient to eat a healthy diet with plenty of whole grains, cereals, fruits and vegetables, exercise regularly and maintain ideal body weight    Follow-up in 6 months or call earlier if needed  Greater than 50% of time during this 30-minute visit was spent on discussion of post stroke depression/anxiety and ongoing/subjective worsening of cognitive deficits, highly stressed the importance of participation in therapies as he desires improvement, discussion regarding residual left hemiparesis with possible deconditioning, importance of staying active, reviewing risk  factor management of HTN and HLD, planning of further management, discussion with  patient and family and coordination of care    Frann Rider, [facility]  [facility] [address] [place], [place] [address]  Phone [phone] Fax [phone] Note: This document was prepared with digital dictation and possible smart phrase technology. Any transcriptional errors that result from this process are unintentional.

## 2019-03-15 ENCOUNTER — Encounter: Payer: Self-pay | Admitting: Adult Health

## 2019-03-16 NOTE — Progress Notes (Signed)
I agree with the above plan 

## 2019-03-19 ENCOUNTER — Ambulatory Visit (INDEPENDENT_AMBULATORY_CARE_PROVIDER_SITE_OTHER): Payer: BC Managed Care – PPO

## 2019-03-19 ENCOUNTER — Other Ambulatory Visit: Payer: Self-pay

## 2019-03-19 ENCOUNTER — Ambulatory Visit (INDEPENDENT_AMBULATORY_CARE_PROVIDER_SITE_OTHER): Payer: BC Managed Care – PPO | Admitting: Family Medicine

## 2019-03-19 ENCOUNTER — Encounter: Payer: Self-pay | Admitting: Family Medicine

## 2019-03-19 VITALS — BP 129/71 | HR 65 | Temp 98.9°F | Resp 16 | Ht 65.0 in | Wt 120.0 lb

## 2019-03-19 DIAGNOSIS — M79644 Pain in right finger(s): Secondary | ICD-10-CM | POA: Diagnosis not present

## 2019-03-19 DIAGNOSIS — R059 Cough, unspecified: Secondary | ICD-10-CM

## 2019-03-19 DIAGNOSIS — R1110 Vomiting, unspecified: Secondary | ICD-10-CM

## 2019-03-19 DIAGNOSIS — R05 Cough: Secondary | ICD-10-CM | POA: Diagnosis not present

## 2019-03-19 DIAGNOSIS — Z9181 History of falling: Secondary | ICD-10-CM

## 2019-03-19 DIAGNOSIS — E1122 Type 2 diabetes mellitus with diabetic chronic kidney disease: Secondary | ICD-10-CM | POA: Diagnosis not present

## 2019-03-19 DIAGNOSIS — I10 Essential (primary) hypertension: Secondary | ICD-10-CM

## 2019-03-19 DIAGNOSIS — N186 End stage renal disease: Secondary | ICD-10-CM

## 2019-03-19 DIAGNOSIS — M79645 Pain in left finger(s): Secondary | ICD-10-CM | POA: Diagnosis not present

## 2019-03-19 DIAGNOSIS — E1121 Type 2 diabetes mellitus with diabetic nephropathy: Secondary | ICD-10-CM

## 2019-03-19 DIAGNOSIS — I6930 Unspecified sequelae of cerebral infarction: Secondary | ICD-10-CM

## 2019-03-19 DIAGNOSIS — Z135 Encounter for screening for eye and ear disorders: Secondary | ICD-10-CM

## 2019-03-19 DIAGNOSIS — Z992 Dependence on renal dialysis: Secondary | ICD-10-CM

## 2019-03-19 DIAGNOSIS — R5381 Other malaise: Secondary | ICD-10-CM

## 2019-03-19 NOTE — Patient Instructions (Addendum)
I recommend meeting with therapist.continue zoloft same dose for now. Physical therapy may help as well. I placed that referral.   Here are a few options for counseling:  [facility]:  [place] [phone]  I will check some labs today. Call cardiology to schedule follow up appointment (6 months planned at may 2020 visit).    Hand stiffness may be some arthritis - tylenol is ok if needed. I will check xray and let you know if there are concerns.   Cough can be due to some drainage down the back of the throat although it sounds that that has improved.  If you do notice some nasal discharge, nasal congestion, or postnasal drip, I would recommend a trial of Flonase nasal spray.  That can be purchased over-the-counter, or let me know and I can send in a prescription if needed.  I will complete the FL2 form and should have some notes available for you by Tuesday of next week.  If additional information needed, can call you on Tuesday to obtain that information.  Return to the clinic or go to the nearest emergency room if any of your symptoms worsen or new symptoms occur.  Cough, Adult Coughing is a reflex that clears your throat and your airways (respiratory system). Coughing helps to heal and protect your lungs. It is normal to cough occasionally, but a cough that happens with other symptoms or lasts a long time may be a sign of a condition that needs treatment. An acute cough may only last 2-3 weeks, while a chronic cough may last 8 or more weeks. Coughing is commonly caused by:  Infection of the respiratory systemby viruses or bacteria.  Breathing in substances that irritate your lungs.  Allergies.  Asthma.  Mucus that runs down the back of your throat (postnasal drip).  Smoking.  Acid backing up from the stomach into the esophagus (gastroesophageal reflux).  Certain medicines.  Chronic lung problems.  Other medical  conditions such as heart failure or a blood clot in the lung (pulmonary embolism). Follow these instructions at home: Medicines  Take over-the-counter and prescription medicines only as told by your health care provider.  Talk with your health care provider before you take a cough suppressant medicine. Lifestyle   Avoid cigarette smoke. Do not use any products that contain nicotine or tobacco, such as cigarettes, e-cigarettes, and chewing tobacco. If you need help quitting, ask your health care provider.  Drink enough fluid to keep your urine pale yellow.  Avoid caffeine.  Do not drink alcohol if your health care provider tells you not to drink. General instructions   Pay close attention to changes in your cough. Tell your health care provider about them.  Always cover your mouth when you cough.  Avoid things that make you cough, such as perfume, candles, cleaning products, or campfire or tobacco smoke.  If the air is dry, use a cool mist vaporizer or humidifier in your bedroom or your home to help loosen secretions.  If your cough is worse at night, try to sleep in a semi-upright position.  Rest as needed.  Keep all follow-up visits as told by your health care provider. This is important. Contact a health care provider if you:  Have new symptoms.  Cough up pus.  Have a cough that does not get better after 2-3 weeks or gets worse.  Cannot control your cough with cough suppressant medicines and you are losing sleep.  Have pain  that gets worse or pain that is not helped with medicine.  Have a fever.  Have unexplained weight loss.  Have night sweats. Get help right away if:  You cough up blood.  You have difficulty breathing.  Your heartbeat is very fast. These symptoms may represent a serious problem that is an emergency. Do not wait to see if the symptoms will go away. Get medical help right away. Call your local emergency services (911 in the [place] Do not  drive yourself to the hospital. Summary  Coughing is a reflex that clears your throat and your airways. It is normal to cough occasionally, but a cough that happens with other symptoms or lasts a long time may be a sign of a condition that needs treatment.  Take over-the-counter and prescription medicines only as told by your health care provider.  Always cover your mouth when you cough.  Contact a health care provider if you have new symptoms or a cough that does not get better after 2-3 weeks or gets worse. This information is not intended to replace advice given to you by your health care provider. Make sure you discuss any questions you have with your health care provider. Document Revised: 02/10/2018 Document Reviewed: 02/10/2018 Elsevier Patient Education  [employer].    If you have lab work done today you will be contacted with your lab results within the next 2 weeks.  If you have not heard from [place] then please contact us. The fastest way to get your results is to register for My Chart.   IF you received an x-ray today, you will receive an invoice from [facility]. Please contact [facility] at [phone] with questions or concerns regarding your invoice.   IF you received labwork today, you will receive an invoice from [facility]. Please contact [facility] at [phone] with questions or concerns regarding your invoice.   Our billing staff will not be able to assist you with questions regarding bills from these companies.  You will be contacted with the lab results as soon as they are available. The fastest way to get your results is to activate your My Chart account. Instructions are located on the last page of this paperwork. If you have not heard from [place] regarding the results in 2 weeks, please contact this office.

## 2019-03-19 NOTE — Progress Notes (Signed)
Subjective:  Patient ID: Jeffery Young, male    DOB: Jan 03, 1966  Age: 54 y.o. MRN: [id number]  CC:  Chief Complaint  Patient presents with  . f/u joint stiffness in fingers    per pt stiffness and pain is the same.  Pain level 1/10 and is not taking any otc meds for pain  . FL2 form completion    daughter has form  . Depression    score 4    HPI Jeffery Young presents for   Hand joint stiffness: Left middle, R index.  Past few months. NKI.  Some soreness at times. Tx: none.   FL2 form, deconditioning, gen weakness, falls.  History of chronic kidney disease on hemodialysis, residual left-sided weakness from lacunar brainstem and bilateral subcortical infarcts from small vessel disease in May 2019.  Residual left hemiparesis, gait difficulty, dysarthria, cognitive impairment.  Previously discussed assisted living facility/nursing facility.  Currently living with his wife with whom he is separated, and his adult children. Plan for [facility] rehabilitation - have some openings.   History of falls. See last visit. 2 falls since last visit - no injuries.  Refused home health PT prior - agrees to have referral now,, but does not want anyone in home, but ok with outpatient physical therapy.  Using walker, but feet tangled at times, drags left leg with residual weakness.   Cough: Chronic. Less cough than last time - family feels like same.  No fever/dyspnea.  See last visit. Recommended flonase nasal spray -did not try. Denies nasal congestion/postnasal drip at this time.  Vomited once last week during coughing fit. Planned xrays after last visit - has not had done.  Denies heartburn.  Quit smoking 2 years ago. Cough since that time - small amount.   Depression: Depressed about not working. Hard to get around.   Did not call therapist.  Denies suicidal ideation.  Taking sertraline 100mg  qd.    Evaluated by neurology February 4.  Note reviewed.  Mild cognitive impairment.  History  depression/anxiety thought to be contributing.  Michael physical therapy activity, exercise thought to be contributing to deconditioning and prevent falls.  Previously recommended to meet with therapist.   DM2: Off meds.  No recent optho eval.  Taking lipitor 10mg  qd .  No home readings.  Lab Results  Component Value Date   HGBA1C 6.4 (H) 10/16/2018   Lab Results  Component Value Date   CHOL 149 10/16/2018   HDL 47 10/16/2018   LDLCALC 70 10/16/2018   TRIG 194 (H) 10/16/2018   CHOLHDL 3.2 10/16/2018   Hx of CAD Cards: Dr. Angelena Form - appt with cardiology in 06/2018. Continued coreg, lipitor, 6 months follow up planned.    Depression screen [facility] 2/9 03/19/2019 03/05/2019 11/27/2018 10/16/2018 06/26/2018  Decreased Interest 1 0 0 0 0  Down, Depressed, Hopeless 2 0 0 0 2  PHQ - 2 Score 3 0 0 0 2  Altered sleeping 0 - - - 0  Tired, decreased energy 1 - - - 0  Change in appetite 0 - - - 0  Feeling bad or failure about yourself  0 - - - 1  Trouble concentrating 0 - - - 0  Moving slowly or fidgety/restless 0 - - - 0  Suicidal thoughts 0 - - - 0  PHQ-9 Score 4 - - - 3  Difficult doing work/chores Somewhat difficult - - - -      History Patient Active Problem List   Diagnosis Date Noted  .  COPD GOLD III  10/24/2017  . CAD (coronary artery disease), native coronary artery 10/10/2017  . ESRD (end stage renal disease) on dialysis ([place]) 06/25/2017  . Uncontrolled hypertension 05/28/2017  . Depression 05/28/2017  . Normocytic anemia 05/28/2017  . History of completed stroke 05/28/2017  . Syncope 03/13/2016  . Ischemic cardiomyopathy 03/13/2016  . Unstable angina pectoris ([place]) 01/11/2016  . Hypertensive heart disease with heart failure ([place])   . Chronic combined systolic and diastolic heart failure ([place])   . Elevated troponin   . Chest pain 01/10/2016  . Coronary artery disease due to lipid rich plaque 01/10/2016  . Status post coronary artery stent placement   . Type 2  diabetes mellitus with diabetic nephropathy, without long-term current use of insulin ([place])   . Tobacco abuse   . Essential hypertension   . Hypercholesteremia   . NSTEMI (non-ST elevated myocardial infarction) ([place]) 12/22/2015  . Lightheadedness 08/22/2012  . Dehydration 08/22/2012   Past Medical History:  Diagnosis Date  . Chest pain 01/10/2016  . Chronic combined systolic and diastolic heart failure ([place])   . Coronary artery disease    a.12/22/15: NSTEMI s/p overlapping DES x2 and balloon angioplasty to distal AV groove Circumflex (too small for a stent).   . Coronary artery disease due to lipid rich plaque 01/10/2016  . Dehydration 08/22/2012  . Depression   . Elevated troponin   . ESRD (end stage renal disease) on dialysis ([place])   . Essential hypertension   . History of completed stroke 05/28/2017  . Hypercholesteremia   . Hypertension   . Hypertensive heart disease with heart failure ([place])   . Ischemic cardiomyopathy 03/13/2016  . Normocytic anemia 05/28/2017  . NSTEMI (non-ST elevated myocardial infarction) ([place]) 12/22/2015  . Prostate infection   . Status post coronary artery stent placement   . Syncope 03/13/2016  . Tobacco abuse   . Type 2 diabetes mellitus with diabetic nephropathy, without long-term current use of insulin ([place])   . Type II diabetes mellitus ([place])   . Uncontrolled hypertension 05/28/2017  . Unstable angina pectoris ([place]) 01/11/2016   Past Surgical History:  Procedure Laterality Date  . BASCILIC VEIN TRANSPOSITION Left 06/28/2017   Procedure: LEFT UPPER ARM ARTERIOVENOUS GORTEX-GRAFT PLACEMENT;  Surgeon: Angelia Mould, MD;  Location: [place];  Service: Vascular;  Laterality: Left;  . CARDIAC CATHETERIZATION N/A 12/22/2015   Procedure: Left Heart Cath and Coronary Angiography;  Surgeon: Burnell Blanks, MD;  Location: [place] [facility];  Service: Cardiovascular;  Laterality: N/A;  . CARDIAC CATHETERIZATION N/A 12/22/2015   Procedure: Coronary  Stent Intervention;  Surgeon: Burnell Blanks, MD;  Location: [place] [facility];  Service: Cardiovascular;  Laterality: N/A;  . CARDIAC CATHETERIZATION N/A 01/11/2016   Procedure: Left Heart Cath and Coronary Angiography;  Surgeon: Sherren Mocha, MD;  Location: [place] [facility];  Service: Cardiovascular;  Laterality: N/A;  . CARDIAC CATHETERIZATION N/A 01/11/2016   Procedure: Intravascular Pressure Wire/FFR Study;  Surgeon: Sherren Mocha, MD;  Location: [place] [facility];  Service: Cardiovascular;  Laterality: N/A;  . CARDIAC CATHETERIZATION N/A 01/11/2016   Procedure: Coronary Stent Intervention;  Surgeon: Sherren Mocha, MD;  Location: [place] [facility];  Service: Cardiovascular;  Laterality: N/A;  . CORONARY ANGIOPLASTY WITH STENT PLACEMENT  12/22/2015  . INSERTION OF DIALYSIS CATHETER Right 06/28/2017   Procedure: INSERTION OF DIALYSIS CATHETER RIGHT INTERNAL JUGULAR;  Surgeon: Angelia Mould, MD;  Location: [place];  Service: Vascular;  Laterality: Right;  No Known Allergies Prior to Admission medications   Medication Sig Start Date End Date Taking? Authorizing Provider  aspirin 81 MG chewable tablet CHEW ONE TABLET BY MOUTH DAILY 10/31/18  Yes [name], MD  atorvastatin (LIPITOR) 10 MG tablet Take 1 tablet (10 mg total) by mouth daily. 06/10/18  Yes Imogene Burn, PA-C  B Complex-C-Folic Acid (RENA-VITE RX) 1 MG TABS TAKE ONE TABLET BY MOUTH EVERY NIGHT AT BEDTIME 12/29/18  Yes [name], MD  calcitRIOL (ROCALTROL) 0.5 MCG capsule TAKE 1 CAPSULE BY MOUTH ON MONDAY, WEDNESDAY, AND FRIDAY WITH HEMODIALYSIS 03/17/18  Yes [name], MD  carvedilol (COREG) 6.25 MG tablet Take 1 tablet (6.25 mg total) by mouth 2 (two) times daily with a meal. 06/10/18  Yes Imogene Burn, PA-C  clopidogrel (PLAVIX) 75 MG tablet TAKE ONE TABLET BY MOUTH DAILY 07/08/18  Yes Burnell Blanks, MD  docusate sodium (STOOL SOFTENER) 100 MG capsule Take 1 capsule (100 mg  total) by mouth 2 (two) times daily. 12/25/18  Yes [name], MD  fluticasone (FLONASE) 50 MCG/ACT nasal spray Place 1-2 sprays into both nostrils daily. 03/05/19  Yes [name], MD  glucose blood [facility] VERIO) test strip 1 each by Other route as needed for other. Use as instructed 1-2 times per day. 04/24/18  Yes [name], MD  Lancets [facility] DELICA PLUS [id number]) MISC USE ONELANCET TO TEST - FOUR TIMES A DAY 08/29/18  Yes [name], MD  nitroGLYCERIN (NITROSTAT) 0.4 MG SL tablet Place 1 tablet (0.4 mg total) under the tongue every 5 (five) minutes x 3 doses as needed for chest pain. 12/26/15  Yes Reino Bellis B, NP  sertraline (ZOLOFT) 100 MG tablet Take 1 tablet (100 mg total) by mouth daily. 11/27/18  Yes [name], MD  sevelamer carbonate (RENVELA) 800 MG tablet Take 1 tablet (800 mg total) by mouth 3 (three) times daily with meals. 08/15/17  Yes [name], MD  STOOL SOFTENER 100 MG capsule TAKE ONE CAPSULE BY MOUTH TWICE A DAY 12/29/18  Yes [name], MD  traZODone (DESYREL) 50 MG tablet Take 1 tablet (50 mg total) by mouth at bedtime. 11/27/18  Yes [name], MD   Social History   Socioeconomic History  . Marital status: Married    Spouse name: Not on file  . Number of children: Not on file  . Years of education: Not on file  . Highest education level: Not on file  Occupational History  . Occupation: International aid/development worker: [employer]  Tobacco Use  . Smoking status: Former Smoker    Packs/day: 0.75    Years: 29.00    Pack years: 21.75    Types: Cigarettes    Quit date: 05/28/2017    Years since quitting: 1.8  . Smokeless tobacco: Never Used  Substance and Sexual Activity  . Alcohol use: Not Currently    Alcohol/week: 7.0 standard drinks    Types: 7 Cans of beer per week    Comment: last drink was prior to last hospitalization  . Drug use: No  . Sexual activity: Not Currently  Other Topics Concern    . Not on file  Social History Narrative  . Not on file   Social Determinants of Health   Financial Resource Strain:   . Difficulty of Paying Living Expenses: Not on file  Food Insecurity:   . Worried About [employer] of  Food in the Last Year: Not on file  . Ran Out of Food in the Last Year: Not on file  Transportation Needs:   . Lack of Transportation (Medical): Not on file  . Lack of Transportation (Non-Medical): Not on file  Physical Activity:   . Days of Exercise per Week: Not on file  . Minutes of Exercise per Session: Not on file  Stress:   . Feeling of Stress : Not on file  Social Connections:   . Frequency of Communication with Friends and Family: Not on file  . Frequency of Social Gatherings with Friends and Family: Not on file  . Attends Religious Services: Not on file  . Active Member of Clubs or Organizations: Not on file  . Attends Archivist Meetings: Not on file  . Marital Status: Not on file  Intimate Partner Violence:   . Fear of Current or Ex-Partner: Not on file  . Emotionally Abused: Not on file  . Physically Abused: Not on file  . Sexually Abused: Not on file    Review of Systems  Per HPI Objective:   Vitals:   03/19/19 1213  BP: 129/71  Pulse: 65  Resp: 16  Temp: 98.9 F (37.2 C)  TempSrc: Oral  SpO2: 94%  Weight: 120 lb (54.4 kg)  Height: 5\' 5"  (1.651 m)     Physical Exam Vitals reviewed.  Constitutional:      General: He is not in acute distress.    Appearance: He is well-developed.  HENT:     Head: Normocephalic and atraumatic.  Eyes:     Pupils: Pupils are equal, round, and reactive to light.  Neck:     Vascular: No carotid bruit or JVD.  Cardiovascular:     Rate and Rhythm: Normal rate and regular rhythm.     Heart sounds: Normal heart sounds. No murmur.  Pulmonary:     Effort: Pulmonary effort is normal. No respiratory distress.     Breath sounds: Normal breath sounds. No wheezing or rales.     Comments:  Cough few times during visit, no respiratory distress.  Somewhat distant breath sounds but no apparent rhonchi/rales, wheeze. Musculoskeletal:     Comments: Right index finger, full extension, slight decreased flexion at the PIP, minimally at MCP.  DIP range of motion intact.  Left middle finger, slight decreaseed PIP/MCP flexion compared to surrounding joints.  Possible slight bony prominence of the PIP.  No finger swelling, erythema, NVI distally.  Skin:    General: Skin is warm and dry.  Neurological:     Mental Status: He is alert and oriented to person, place, and time.  Psychiatric:        Attention and Perception: Attention normal.        Mood and Affect: Affect is flat.        Speech: Speech is delayed (slow speech at times, slight dysarthria. ).        Behavior: Behavior is slowed.        Thought Content: Thought content does not include suicidal ideation.   4/5 strength left arm, left leg.  DG Chest 2 View  Result Date: 03/19/2019 CLINICAL DATA:  Shortness of breath. Cough and congestion. EXAM: CHEST - 2 VIEW COMPARISON:  Radiograph 11/10/2018 FINDINGS: Artifact projects over the right hemithorax with resultant apparent increased density. This appears external to the patient. The cardiomediastinal contours are normal. Coronary stent is visualized. Aortic atherosclerosis. Pulmonary vasculature is normal. No consolidation, pleural effusion, or pneumothorax.  No acute osseous abnormalities are seen. IMPRESSION: 1. No acute abnormalities. 2. Aortic Atherosclerosis (ICD10-I70.0). Coronary stent visualized. Electronically Signed   By: Keith Rake M.D.   On: 03/19/2019 13:57   DG Finger Index Right  Result Date: 03/19/2019 CLINICAL DATA:  Laceration, rule out fracture. Finger pain post fall. EXAM: RIGHT INDEX FINGER 2+V COMPARISON:  None. FINDINGS: There is no evidence of fracture or dislocation. Alignment and joint spaces are maintained. Mild generalized soft tissue edema. No tracking  soft tissue air. No radiopaque foreign body. IMPRESSION: Soft tissue edema without acute osseous abnormality. No radiopaque foreign body or tracking soft tissue air. Electronically Signed   By: Keith Rake M.D.   On: 03/19/2019 13:59   DG Finger Middle Left  Result Date: 03/19/2019 CLINICAL DATA:  Finger pain, injury EXAM: LEFT MIDDLE FINGER 2+V COMPARISON:  None. FINDINGS: Images are centered on the left fourth digit. There is no evidence of fracture or dislocation. There is no evidence of arthropathy or other focal bone abnormality. Soft tissues are unremarkable. IMPRESSION: Negative radiographs of the left fourth digit. Electronically Signed   By: Audie Pinto M.D.   On: 03/19/2019 13:56     Assessment & Plan:  Noam Arutyunyan is a 54 y.o. male . Essential hypertension - Plan: Comprehensive metabolic panel, Lipid panel, DG Chest 2 View  -  Stable, tolerating current regimen. Appears to be due for cardiology follow up.   Type 2 diabetes mellitus with chronic kidney disease on chronic dialysis, without long-term current use of insulin ([employer]) - Plan: Comprehensive metabolic panel, HM Diabetes Foot Exam, Hemoglobin A1c Type 2 diabetes mellitus with diabetic nephropathy, without long-term current use of insulin ([place]) - Plan: HM Diabetes Foot Exam, Hemoglobin A1c  -Previously stable off medications, repeat A1c.  Screening for diabetic retinopathy - Plan: Ambulatory referral to Ophthalmology  History of CVA with residual deficit - Plan: Ambulatory referral to Physical Therapy Physical deconditioning - Plan: Ambulatory referral to Physical Therapy Personal history of fall - Plan: DG Finger Index Right, DG Finger Middle Left  -Recent neurology note reviewed.  Suspect there is some component of depression along with physical deconditioning, which both may be impacting each other.  Decreased motivation from depression, some depression from decreased ability, reliance on others.  -Continue  Zoloft, recommended counseling again.  Trial of outpatient physical therapy to help with deconditioning, strength, could consider adjustment in medication as well.  Cough - Plan: DG Chest 2 View Post-tussive emesis.   -reassuring xray - no infiltrate, or acute findings.  Still wonder if he may have some postnasal discharge triggering cough.  Option of Flonase discussed. No reflux symptoms, but option of PPI if persists or pulmonary eval  Previous emesis sounds like posttussive emesis, abdomen is nontender today.  RTC precautions if worsening vomiting or any associated abdominal pain. labs as above.    No orders of the defined types were placed in this encounter.  Patient Instructions    I recommend meeting with therapist.continue zoloft same dose for now. Physical therapy may help as well. I placed that referral.   Here are a few options for counseling:  [facility]:  [place] [phone]  I will check some labs today. Call cardiology to schedule follow up appointment (6 months planned at may 2020 visit).    Hand stiffness may be some arthritis - tylenol is ok if needed. I will check xray and let you know if there are concerns.   Cough  can be due to some drainage down the back of the throat although it sounds that that has improved.  If you do notice some nasal discharge, nasal congestion, or postnasal drip, I would recommend a trial of Flonase nasal spray.  That can be purchased over-the-counter, or let me know and I can send in a prescription if needed.  I will complete the FL2 form and should have some notes available for you by Tuesday of next week.  If additional information needed, can call you on Tuesday to obtain that information.  Return to the clinic or go to the nearest emergency room if any of your symptoms worsen or new symptoms occur.  Cough, Adult Coughing is a reflex that clears your throat and your airways  (respiratory system). Coughing helps to heal and protect your lungs. It is normal to cough occasionally, but a cough that happens with other symptoms or lasts a long time may be a sign of a condition that needs treatment. An acute cough may only last 2-3 weeks, while a chronic cough may last 8 or more weeks. Coughing is commonly caused by:  Infection of the respiratory systemby viruses or bacteria.  Breathing in substances that irritate your lungs.  Allergies.  Asthma.  Mucus that runs down the back of your throat (postnasal drip).  Smoking.  Acid backing up from the stomach into the esophagus (gastroesophageal reflux).  Certain medicines.  Chronic lung problems.  Other medical conditions such as heart failure or a blood clot in the lung (pulmonary embolism). Follow these instructions at home: Medicines  Take over-the-counter and prescription medicines only as told by your health care provider.  Talk with your health care provider before you take a cough suppressant medicine. Lifestyle   Avoid cigarette smoke. Do not use any products that contain nicotine or tobacco, such as cigarettes, e-cigarettes, and chewing tobacco. If you need help quitting, ask your health care provider.  Drink enough fluid to keep your urine pale yellow.  Avoid caffeine.  Do not drink alcohol if your health care provider tells you not to drink. General instructions   Pay close attention to changes in your cough. Tell your health care provider about them.  Always cover your mouth when you cough.  Avoid things that make you cough, such as perfume, candles, cleaning products, or campfire or tobacco smoke.  If the air is dry, use a cool mist vaporizer or humidifier in your bedroom or your home to help loosen secretions.  If your cough is worse at night, try to sleep in a semi-upright position.  Rest as needed.  Keep all follow-up visits as told by your health care provider. This is  important. Contact a health care provider if you:  Have new symptoms.  Cough up pus.  Have a cough that does not get better after 2-3 weeks or gets worse.  Cannot control your cough with cough suppressant medicines and you are losing sleep.  Have pain that gets worse or pain that is not helped with medicine.  Have a fever.  Have unexplained weight loss.  Have night sweats. Get help right away if:  You cough up blood.  You have difficulty breathing.  Your heartbeat is very fast. These symptoms may represent a serious problem that is an emergency. Do not wait to see if the symptoms will go away. Get medical help right away. Call your local emergency services (911 in the [place] Do not drive yourself to the hospital. Summary  Coughing  is a reflex that clears your throat and your airways. It is normal to cough occasionally, but a cough that happens with other symptoms or lasts a long time may be a sign of a condition that needs treatment.  Take over-the-counter and prescription medicines only as told by your health care provider.  Always cover your mouth when you cough.  Contact a health care provider if you have new symptoms or a cough that does not get better after 2-3 weeks or gets worse. This information is not intended to replace advice given to you by your health care provider. Make sure you discuss any questions you have with your health care provider. Document Revised: 02/10/2018 Document Reviewed: 02/10/2018 Elsevier Patient Education  [employer].    If you have lab work done today you will be contacted with your lab results within the next 2 weeks.  If you have not heard from [place] then please contact us. The fastest way to get your results is to register for My Chart.   IF you received an x-ray today, you will receive an invoice from [facility]. Please contact [facility] at [phone] with questions or concerns regarding your invoice.    IF you received labwork today, you will receive an invoice from [facility]. Please contact [facility] at [phone] with questions or concerns regarding your invoice.   Our billing staff will not be able to assist you with questions regarding bills from these companies.  You will be contacted with the lab results as soon as they are available. The fastest way to get your results is to activate your My Chart account. Instructions are located on the last page of this paperwork. If you have not heard from [place] regarding the results in 2 weeks, please contact this office.         Signed, Merri Ray, MD [facility]

## 2019-03-20 LAB — COMPREHENSIVE METABOLIC PANEL
ALT: 12 IU/L (ref 0–44)
AST: 13 IU/L (ref 0–40)
Albumin/Globulin Ratio: 1.2 (ref 1.2–2.2)
Albumin: 4.2 g/dL (ref 3.8–4.9)
Alkaline Phosphatase: 133 IU/L — ABNORMAL HIGH (ref 39–117)
BUN/Creatinine Ratio: 10 (ref 9–20)
BUN: 57 mg/dL — ABNORMAL HIGH (ref 6–24)
Bilirubin Total: <0.2 mg/dL (ref 0.0–1.2)
CO2: 26 mmol/L (ref 20–29)
Calcium: 8.8 mg/dL (ref 8.7–10.2)
Chloride: 92 mmol/L — ABNORMAL LOW (ref 96–106)
Creatinine, Ser: 5.96 mg/dL (ref 0.76–1.27)
GFR calc Af Amer: 11 mL/min/{1.73_m2} — ABNORMAL LOW (ref >59)
GFR calc non Af Amer: 10 mL/min/{1.73_m2} — ABNORMAL LOW (ref >59)
Globulin, Total: 3.6 g/dL (ref 1.5–4.5)
Glucose: 141 mg/dL — ABNORMAL HIGH (ref 65–99)
Potassium: 4.7 mmol/L (ref 3.5–5.2)
Sodium: 137 mmol/L (ref 134–144)
Total Protein: 7.8 g/dL (ref 6.0–8.5)

## 2019-03-20 LAB — LIPID PANEL
Chol/HDL Ratio: 3.1 ratio (ref 0.0–5.0)
Cholesterol, Total: 141 mg/dL (ref 100–199)
HDL: 46 mg/dL (ref >39)
LDL Chol Calc (NIH): 65 mg/dL (ref 0–99)
Triglycerides: 178 mg/dL — ABNORMAL HIGH (ref 0–149)
VLDL Cholesterol Cal: 30 mg/dL (ref 5–40)

## 2019-03-20 LAB — HEMOGLOBIN A1C
Est. average glucose Bld gHb Est-mCnc: 163 mg/dL
Hgb A1c MFr Bld: 7.3 % — ABNORMAL HIGH (ref 4.8–5.6)

## 2019-03-22 ENCOUNTER — Encounter: Payer: Self-pay | Admitting: Family Medicine

## 2019-03-24 ENCOUNTER — Telehealth: Payer: Self-pay | Admitting: Family Medicine

## 2019-03-24 DIAGNOSIS — R05 Cough: Secondary | ICD-10-CM

## 2019-03-24 DIAGNOSIS — J449 Chronic obstructive pulmonary disease, unspecified: Secondary | ICD-10-CM

## 2019-03-24 DIAGNOSIS — R059 Cough, unspecified: Secondary | ICD-10-CM

## 2019-03-24 MED ORDER — BREO ELLIPTA 100-25 MCG/INH IN AEPB: 1.0000 | INHALATION_SPRAY | Freq: Every day | RESPIRATORY_TRACT | 1 refills | Status: DC

## 2019-03-24 NOTE — Telephone Encounter (Signed)
With cough - prior hx reviewed. COPD - unsure if Breo helpful in past.  will try again. Has follow up planned 04/16/19 with me.   FL2 completed with discussion with Ayesho over phone. Ready for pickup in am.

## 2019-04-14 ENCOUNTER — Ambulatory Visit: Payer: BC Managed Care – PPO | Admitting: Adult Health

## 2019-04-16 ENCOUNTER — Encounter: Payer: Self-pay | Admitting: Family Medicine

## 2019-04-16 ENCOUNTER — Other Ambulatory Visit: Payer: Self-pay

## 2019-04-16 ENCOUNTER — Ambulatory Visit (INDEPENDENT_AMBULATORY_CARE_PROVIDER_SITE_OTHER): Payer: BC Managed Care – PPO | Admitting: Family Medicine

## 2019-04-16 VITALS — BP 113/68 | HR 68 | Temp 99.0°F | Ht 65.0 in

## 2019-04-16 DIAGNOSIS — M25649 Stiffness of unspecified hand, not elsewhere classified: Secondary | ICD-10-CM

## 2019-04-16 DIAGNOSIS — M79645 Pain in left finger(s): Secondary | ICD-10-CM

## 2019-04-16 DIAGNOSIS — J449 Chronic obstructive pulmonary disease, unspecified: Secondary | ICD-10-CM | POA: Diagnosis not present

## 2019-04-16 DIAGNOSIS — F329 Major depressive disorder, single episode, unspecified: Secondary | ICD-10-CM | POA: Diagnosis not present

## 2019-04-16 DIAGNOSIS — F32A Depression, unspecified: Secondary | ICD-10-CM

## 2019-04-16 DIAGNOSIS — R05 Cough: Secondary | ICD-10-CM

## 2019-04-16 DIAGNOSIS — M79644 Pain in right finger(s): Secondary | ICD-10-CM

## 2019-04-16 DIAGNOSIS — R059 Cough, unspecified: Secondary | ICD-10-CM

## 2019-04-16 MED ORDER — BREO ELLIPTA 100-25 MCG/INH IN AEPB: 1.0000 | INHALATION_SPRAY | Freq: Every day | RESPIRATORY_TRACT | 1 refills | Status: DC

## 2019-04-16 NOTE — Progress Notes (Signed)
Subjective:  Patient ID: Jeffery Young, male    DOB: 01-29-1966  Age: 54 y.o. MRN: [id number]  CC:  Chief Complaint  Patient presents with  . Follow-up    on falls, deprresion, and cough. pt hasn't had any falls since last vist. pt still has his cough with no change since last visit. pt states he feels a little better as far as his depression since last vist.     HPI Jeffery Young presents for   Cough: See previous visits.  Had reported persistent cough since quitting smoking 2 years ago.  Possible upper airway component, recommended Flonase, but also with history of COPD, without much improvement on Breo in the past.  Option of PPI discussed as well as pulmonary eval.  Discussed repeat trial of Breo for possible sleep apnea contributor of cough.  100/25 mg prescription provided February 16.  Has not filled breo.  Still same cough per family member in office. Not using nasal spray either.   History of falls Discussed at last visit.  Has been evaluated by neurology, mild cognitive impairment, component of deconditioning.  Physical therapy had previously been recommended, agreed to PT at last visit.  Outpatient PT requested.  Continued on walker, home fall precautions discussed.  Residual left leg weakness with previous CVA. Has not had PT - not sure if they called.  No falls since last visit.  Family had discussed getting outside, he declined.  Finger swelling Tylenol discussed last time for possible component of arthritis.  X-rays obtained on index finger on the right, middle finger on the left as below.  There was some soft tissue edema on the right index finger without bony abnormality.  Negative left middle finger.  Still some soreness in R index, left middle finger. No swelling, just sore., hard to make a fist.    DG Finger Index Right  Result Date: 03/19/2019 CLINICAL DATA:  Laceration, rule out fracture. Finger pain post fall. EXAM: RIGHT INDEX FINGER 2+V COMPARISON:  None.  FINDINGS: There is no evidence of fracture or dislocation. Alignment and joint spaces are maintained. Mild generalized soft tissue edema. No tracking soft tissue air. No radiopaque foreign body. IMPRESSION: Soft tissue edema without acute osseous abnormality. No radiopaque foreign body or tracking soft tissue air. Electronically Signed   By: Keith Rake M.D.   On: 03/19/2019 13:59   DG Finger Middle Left  Result Date: 03/19/2019 CLINICAL DATA:  Finger pain, injury EXAM: LEFT MIDDLE FINGER 2+V COMPARISON:  None. FINDINGS: Images are centered on the left fourth digit. There is no evidence of fracture or dislocation. There is no evidence of arthropathy or other focal bone abnormality. Soft tissues are unremarkable. IMPRESSION: Negative radiographs of the left fourth digit. Electronically Signed   By: Audie Pinto M.D.   On: 03/19/2019 13:56   Depression Discussed last visit, thought to be component from deconditioning, reliance of others.  He was continued on Zoloft, counseling recommended, outpatient physical therapy ordered as above. Feels like depression is improved since last visit, PHQ score improved as below. Did not meet with counselor. Would like to do so at this time.  Denies SI.  Would like to get back to walking.    Depression screen [facility] 2/9 04/16/2019 03/19/2019 03/05/2019 11/27/2018 10/16/2018  Decreased Interest 0 1 0 0 0  Down, Depressed, Hopeless 0 2 0 0 0  PHQ - 2 Score 0 3 0 0 0  Altered sleeping - 0 - - -  Tired, decreased energy -  1 - - -  Change in appetite - 0 - - -  Feeling bad or failure about yourself  - 0 - - -  Trouble concentrating - 0 - - -  Moving slowly or fidgety/restless - 0 - - -  Suicidal thoughts - 0 - - -  PHQ-9 Score - 4 - - -  Difficult doing work/chores - Somewhat difficult - - -        History Patient Active Problem List   Diagnosis Date Noted  . COPD GOLD III  10/24/2017  . CAD (coronary artery disease), native coronary artery  10/10/2017  . ESRD (end stage renal disease) on dialysis ([place]) 06/25/2017  . Uncontrolled hypertension 05/28/2017  . Depression 05/28/2017  . Normocytic anemia 05/28/2017  . History of completed stroke 05/28/2017  . Syncope 03/13/2016  . Ischemic cardiomyopathy 03/13/2016  . Unstable angina pectoris ([place]) 01/11/2016  . Hypertensive heart disease with heart failure ([place])   . Chronic combined systolic and diastolic heart failure ([place])   . Elevated troponin   . Chest pain 01/10/2016  . Coronary artery disease due to lipid rich plaque 01/10/2016  . Status post coronary artery stent placement   . Type 2 diabetes mellitus with diabetic nephropathy, without long-term current use of insulin ([place])   . Tobacco abuse   . Essential hypertension   . Hypercholesteremia   . NSTEMI (non-ST elevated myocardial infarction) ([place]) 12/22/2015  . Lightheadedness 08/22/2012  . Dehydration 08/22/2012   Past Medical History:  Diagnosis Date  . Chest pain 01/10/2016  . Chronic combined systolic and diastolic heart failure ([place])   . Coronary artery disease    a.12/22/15: NSTEMI s/p overlapping DES x2 and balloon angioplasty to distal AV groove Circumflex (too small for a stent).   . Coronary artery disease due to lipid rich plaque 01/10/2016  . Dehydration 08/22/2012  . Depression   . Elevated troponin   . ESRD (end stage renal disease) on dialysis ()   . Essential hypertension   . History of completed stroke 05/28/2017  . Hypercholesteremia   . Hypertension   . Hypertensive heart disease with heart failure ([place])   . Ischemic cardiomyopathy 03/13/2016  . Normocytic anemia 05/28/2017  . NSTEMI (non-ST elevated myocardial infarction) ([place]) 12/22/2015  . Prostate infection   . Status post coronary artery stent placement   . Syncope 03/13/2016  . Tobacco abuse   . Type 2 diabetes mellitus with diabetic nephropathy, without long-term current use of insulin ([place])   . Type II diabetes mellitus ([place])   .  Uncontrolled hypertension 05/28/2017  . Unstable angina pectoris ([place]) 01/11/2016   Past Surgical History:  Procedure Laterality Date  . BASCILIC VEIN TRANSPOSITION Left 06/28/2017   Procedure: LEFT UPPER ARM ARTERIOVENOUS GORTEX-GRAFT PLACEMENT;  Surgeon: Angelia Mould, MD;  Location: [facility];  Service: Vascular;  Laterality: Left;  . CARDIAC CATHETERIZATION N/A 12/22/2015   Procedure: Left Heart Cath and Coronary Angiography;  Surgeon: Burnell Blanks, MD;  Location: [place] [facility];  Service: Cardiovascular;  Laterality: N/A;  . CARDIAC CATHETERIZATION N/A 12/22/2015   Procedure: Coronary Stent Intervention;  Surgeon: Burnell Blanks, MD;  Location: [place] [facility];  Service: Cardiovascular;  Laterality: N/A;  . CARDIAC CATHETERIZATION N/A 01/11/2016   Procedure: Left Heart Cath and Coronary Angiography;  Surgeon: Sherren Mocha, MD;  Location: [place] [facility];  Service: Cardiovascular;  Laterality: N/A;  . CARDIAC CATHETERIZATION N/A 01/11/2016   Procedure: Intravascular Pressure Wire/FFR Study;  Surgeon:  Sherren Mocha, MD;  Location: [place] [facility];  Service: Cardiovascular;  Laterality: N/A;  . CARDIAC CATHETERIZATION N/A 01/11/2016   Procedure: Coronary Stent Intervention;  Surgeon: Sherren Mocha, MD;  Location: [facility] [facility];  Service: Cardiovascular;  Laterality: N/A;  . CORONARY ANGIOPLASTY WITH STENT PLACEMENT  12/22/2015  . INSERTION OF DIALYSIS CATHETER Right 06/28/2017   Procedure: INSERTION OF DIALYSIS CATHETER RIGHT INTERNAL JUGULAR;  Surgeon: Angelia Mould, MD;  Location: [facility] OR;  Service: Vascular;  Laterality: Right;   No Known Allergies Prior to Admission medications   Medication Sig Start Date End Date Taking? Authorizing Provider  aspirin 81 MG chewable tablet CHEW ONE TABLET BY MOUTH DAILY 10/31/18  Yes [name], MD  atorvastatin (LIPITOR) 10 MG tablet Take 1 tablet (10 mg total) by mouth daily. 06/10/18  Yes Imogene Burn, PA-C  B Complex-C-Folic Acid (RENA-VITE RX) 1 MG TABS TAKE ONE TABLET BY MOUTH EVERY NIGHT AT BEDTIME 12/29/18  Yes [name], MD  calcitRIOL (ROCALTROL) 0.5 MCG capsule TAKE 1 CAPSULE BY MOUTH ON MONDAY, WEDNESDAY, AND FRIDAY WITH HEMODIALYSIS 03/17/18  Yes [name], MD  carvedilol (COREG) 6.25 MG tablet Take 1 tablet (6.25 mg total) by mouth 2 (two) times daily with a meal. 06/10/18  Yes Imogene Burn, PA-C  clopidogrel (PLAVIX) 75 MG tablet TAKE ONE TABLET BY MOUTH DAILY 07/08/18  Yes Burnell Blanks, MD  docusate sodium (STOOL SOFTENER) 100 MG capsule Take 1 capsule (100 mg total) by mouth 2 (two) times daily. 12/25/18  Yes [name], MD  fluticasone (FLONASE) 50 MCG/ACT nasal spray Place 1-2 sprays into both nostrils daily. 03/05/19  Yes [name], MD  fluticasone furoate-vilanterol (BREO ELLIPTA) 100-25 MCG/INH AEPB Inhale 1 puff into the lungs daily. 03/24/19  Yes [name], MD  glucose blood [facility] VERIO) test strip 1 each by Other route as needed for other. Use as instructed 1-2 times per day. 04/24/18  Yes [name], MD  Lancets [facility] DELICA PLUS [id number]) MISC USE ONELANCET TO TEST - FOUR TIMES A DAY 08/29/18  Yes [name], MD  nitroGLYCERIN (NITROSTAT) 0.4 MG SL tablet Place 1 tablet (0.4 mg total) under the tongue every 5 (five) minutes x 3 doses as needed for chest pain. 12/26/15  Yes Reino Bellis B, NP  sertraline (ZOLOFT) 100 MG tablet Take 1 tablet (100 mg total) by mouth daily. 11/27/18  Yes [name], MD  sevelamer carbonate (RENVELA) 800 MG tablet Take 1 tablet (800 mg total) by mouth 3 (three) times daily with meals. 08/15/17  Yes [name], MD  STOOL SOFTENER 100 MG capsule TAKE ONE CAPSULE BY MOUTH TWICE A DAY 12/29/18  Yes [name], MD  traZODone (DESYREL) 50 MG tablet Take 1 tablet (50 mg total) by mouth at bedtime. 11/27/18  Yes [name], MD   Social History    Socioeconomic History  . Marital status: Married    Spouse name: Not on file  . Number of children: Not on file  . Years of education: Not on file  . Highest education level: Not on file  Occupational History  . Occupation: International aid/development worker: [employer]  Tobacco Use  . Smoking status: Former Smoker    Packs/day: 0.75    Years: 29.00    Pack years: 21.75    Types: Cigarettes    Quit date: 05/28/2017    Years  since quitting: 1.8  . Smokeless tobacco: Never Used  Substance and Sexual Activity  . Alcohol use: Not Currently    Alcohol/week: 7.0 standard drinks    Types: 7 Cans of beer per week    Comment: last drink was prior to last hospitalization  . Drug use: No  . Sexual activity: Not Currently  Other Topics Concern  . Not on file  Social History Narrative  . Not on file   Social Determinants of Health   Financial Resource Strain:   . Difficulty of Paying Living Expenses:   Food Insecurity:   . Worried About Charity fundraiser in the Last Year:   . Arboriculturist in the Last Year:   Transportation Needs:   . Film/video editor (Medical):   Marland Kitchen Lack of Transportation (Non-Medical):   Physical Activity:   . Days of Exercise per Week:   . Minutes of Exercise per Session:   Stress:   . Feeling of Stress :   Social Connections:   . Frequency of Communication with Friends and Family:   . Frequency of Social Gatherings with Friends and Family:   . Attends Religious Services:   . Active Member of Clubs or Organizations:   . Attends Archivist Meetings:   Marland Kitchen Marital Status:   Intimate Partner Violence:   . Fear of Current or Ex-Partner:   . Emotionally Abused:   Marland Kitchen Physically Abused:   . Sexually Abused:     Review of Systems   Objective:   Vitals:   04/16/19 1101  BP: 113/68  Pulse: 68  Temp: 99 F (37.2 C)  TempSrc: Temporal  SpO2: 96%  Height: 5\' 5"  (1.651 m)     Physical Exam Vitals reviewed.  Constitutional:       Appearance: He is well-developed.  HENT:     Head: Normocephalic and atraumatic.  Eyes:     Pupils: Pupils are equal, round, and reactive to light.  Neck:     Vascular: No carotid bruit or JVD.  Cardiovascular:     Rate and Rhythm: Normal rate and regular rhythm.     Heart sounds: Normal heart sounds. No murmur.  Pulmonary:     Effort: Pulmonary effort is normal. No respiratory distress.     Breath sounds: Rhonchi (Few scattered coarse breath sounds bilaterally, normal effort, no distress.) present. No wheezing or rales.  Musculoskeletal:     Comments: Right index finger, no apparent pain but slight decreased flexion at the PIP thanks slight discomfort with flexion at PIP only.  Extension intact.  Cap refill less than 1 second fingertip.  Left hand middle finger slight decreased flexion at PIP, full extension.  Slight discomfort at PIP with flexion.  Neurovascular intact distally.  Skin:    General: Skin is warm and dry.  Neurological:     Mental Status: He is alert and oriented to person, place, and time.  Psychiatric:        Attention and Perception: Attention normal.        Mood and Affect: Mood is depressed. Affect is flat.        Behavior: Behavior is slowed.        Thought Content: Thought content is not paranoid. Thought content does not include homicidal or suicidal ideation.        Assessment & Plan:  Jeffery Young is a 54 y.o. male . Depression, unspecified depression type  -Recommended counseling.  I do suspect that physical therapy  will help with his feeling of reliance on others as well as depression.  Continue same dose Zoloft for now.  -We will check into status of physical therapy for deconditioning, weakness.  Cough - Plan: fluticasone furoate-vilanterol (BREO ELLIPTA) 100-25 MCG/INH AEPB Chronic obstructive pulmonary disease, unspecified COPD type (HCC) - Plan: fluticasone furoate-vilanterol (BREO ELLIPTA) 100-25 MCG/INH AEPB  -Possible COPD component versus  postnasal drip.  Still recommended Flonase nasal spray, but restart Breo Ellipta to see if that helps cough.  RTC precautions  Finger pain, left - Plan: Ambulatory referral to Hand Surgery Finger pain, right - Plan: Ambulatory referral to Hand Surgery Stiffness of finger joint, unspecified laterality - Plan: Ambulatory referral to Hand Surgery  -Appears to be primarily issue at his PIP of both fingers.  Reassuring x-rays.  Will refer to hand surgeon for further evaluation.  Meds ordered this encounter  Medications  . fluticasone furoate-vilanterol (BREO ELLIPTA) 100-25 MCG/INH AEPB    Sig: Inhale 1 puff into the lungs daily.    Dispense:  60 each    Refill:  1   Patient Instructions    I will check into physical therapy. I do think that will help with fall prevention and independence and depression.  Please call counselor as I do believe this will help you reach your goals. No change in Zoloft dose at this time.  Try breo ellipta for cough. flonase nasal spray if any nasal congestion or drainage.   Tylenol if needed for finger pain, but I will  refer you to hand specialist.   Return to the clinic or go to the nearest emergency room if any of your symptoms worsen or new symptoms occur.  Here are a few options for counseling:  [facility]:  [place] [phone]      If you have lab work done today you will be contacted with your lab results within the next 2 weeks.  If you have not heard from [place] then please contact us. The fastest way to get your results is to register for My Chart.   IF you received an x-ray today, you will receive an invoice from [facility]. Please contact [facility] at [phone] with questions or concerns regarding your invoice.   IF you received labwork today, you will receive an invoice from [facility]. Please contact [facility] at [phone] with questions or concerns  regarding your invoice.   Our billing staff will not be able to assist you with questions regarding bills from these companies.  You will be contacted with the lab results as soon as they are available. The fastest way to get your results is to activate your My Chart account. Instructions are located on the last page of this paperwork. If you have not heard from [place] regarding the results in 2 weeks, please contact this office.         Signed, Merri Ray, MD [facility]

## 2019-04-16 NOTE — Patient Instructions (Addendum)
  I will check into physical therapy. I do think that will help with fall prevention and independence and depression.  Please call counselor as I do believe this will help you reach your goals. No change in Zoloft dose at this time.  Try breo ellipta for cough. flonase nasal spray if any nasal congestion or drainage.   Tylenol if needed for finger pain, but I will  refer you to hand specialist.   Return to the clinic or go to the nearest emergency room if any of your symptoms worsen or new symptoms occur.  Here are a few options for counseling:  [facility]:  [place] [phone]      If you have lab work done today you will be contacted with your lab results within the next 2 weeks.  If you have not heard from [place] then please contact us. The fastest way to get your results is to register for My Chart.   IF you received an x-ray today, you will receive an invoice from [facility]. Please contact [facility] at [phone] with questions or concerns regarding your invoice.   IF you received labwork today, you will receive an invoice from [facility]. Please contact [facility] at [phone] with questions or concerns regarding your invoice.   Our billing staff will not be able to assist you with questions regarding bills from these companies.  You will be contacted with the lab results as soon as they are available. The fastest way to get your results is to activate your My Chart account. Instructions are located on the last page of this paperwork. If you have not heard from [place] regarding the results in 2 weeks, please contact this office.

## 2019-04-17 ENCOUNTER — Encounter: Payer: Self-pay | Admitting: Family Medicine

## 2019-04-22 ENCOUNTER — Other Ambulatory Visit: Payer: Self-pay | Admitting: Family Medicine

## 2019-04-22 DIAGNOSIS — I509 Heart failure, unspecified: Secondary | ICD-10-CM

## 2019-04-22 NOTE — Telephone Encounter (Signed)
Requested Prescriptions  Pending Prescriptions Disp Refills  . aspirin 81 MG chewable tablet [Pharmacy Med Name: ASPIRIN 81 MG CHEWABLE TABLET] 90 tablet 3    Sig: CHEW ONE TABLET BY MOUTH DAILY     Analgesics:  NSAIDS - aspirin Passed - 04/22/2019  6:14 AM      Passed - Patient is not pregnant      Passed - Valid encounter within last 12 months    Recent Outpatient Visits          6 days ago Depression, unspecified depression type   [facility], Ranell Patrick, MD   1 month ago Finger pain, left   [facility], Ranell Patrick, MD   1 month ago Stiffness of finger joint, unspecified laterality   [facility], Ranell Patrick, MD   4 months ago Insomnia, unspecified type   [facility], Ranell Patrick, MD   6 months ago Type 2 diabetes mellitus with chronic kidney disease on chronic dialysis, without long-term current use of insulin [facility])   [facility], Ranell Patrick, MD      Future Appointments            In 3 weeks Carlota Raspberry Ranell Patrick, MD [facility] at [place], [facility]

## 2019-04-26 ENCOUNTER — Other Ambulatory Visit: Payer: Self-pay | Admitting: Family Medicine

## 2019-04-26 DIAGNOSIS — N186 End stage renal disease: Secondary | ICD-10-CM

## 2019-04-26 DIAGNOSIS — I509 Heart failure, unspecified: Secondary | ICD-10-CM

## 2019-04-26 NOTE — Telephone Encounter (Signed)
Requested Prescriptions  Pending Prescriptions Disp Refills  . aspirin 81 MG chewable tablet [Pharmacy Med Name: ASPIRIN 81 MG CHEWABLE TABLET] 90 tablet     Sig: CHEW ONE TABLET BY MOUTH DAILY     Analgesics:  NSAIDS - aspirin Passed - 04/26/2019 11:11 AM      Passed - Patient is not pregnant      Passed - Valid encounter within last 12 months    Recent Outpatient Visits          1 week ago Depression, unspecified depression type   [facility], Ranell Patrick, MD   1 month ago Finger pain, left   [facility] [place] [facility], Ranell Patrick, MD   1 month ago Stiffness of finger joint, unspecified laterality   [facility] [place], Ranell Patrick, MD   5 months ago Insomnia, unspecified type   [facility] [place], Ranell Patrick, MD   6 months ago Type 2 diabetes mellitus with chronic kidney disease on chronic dialysis, without long-term current use of insulin [facility])   [facility] [place], Ranell Patrick, MD      Future Appointments            In 2 weeks Carlota Raspberry Ranell Patrick, MD [facility] at [place], [facility]           . B Complex-C-Folic Acid (RENA-VITE RX) 1 MG TABS [Pharmacy Med Name: RENA-VITE RX TABLET] 90 tablet 1    Sig: TAKE ONE TABLET BY MOUTH AT BEDTIME     Off-Protocol Failed - 04/26/2019 11:11 AM      Failed - Medication not assigned to a protocol, review manually.      Passed - Valid encounter within last 12 months    Recent Outpatient Visits          1 week ago Depression, unspecified depression type   [facility] [place] [facility], Ranell Patrick, MD   1 month ago Finger pain, left   [facility] [place] [facility], Ranell Patrick, MD   1 month ago Stiffness of finger joint, unspecified laterality   [facility] [place], Ranell Patrick, MD   5 months ago Insomnia, unspecified type   [facility] [place] [facility], Ranell Patrick, MD   6 months ago Type 2 diabetes mellitus with chronic kidney disease on chronic dialysis, without long-term current use of  insulin [facility])   [facility] [place], Ranell Patrick, MD      Future Appointments            In 2 weeks Carlota Raspberry Ranell Patrick, MD Primary [facility] at [place], [facility]

## 2019-04-28 ENCOUNTER — Encounter: Payer: Self-pay | Admitting: Orthopaedic Surgery

## 2019-04-28 ENCOUNTER — Other Ambulatory Visit: Payer: Self-pay

## 2019-04-28 ENCOUNTER — Ambulatory Visit (INDEPENDENT_AMBULATORY_CARE_PROVIDER_SITE_OTHER): Payer: BC Managed Care – PPO | Admitting: Orthopaedic Surgery

## 2019-04-28 DIAGNOSIS — M65332 Trigger finger, left middle finger: Secondary | ICD-10-CM | POA: Diagnosis not present

## 2019-04-28 DIAGNOSIS — M65321 Trigger finger, right index finger: Secondary | ICD-10-CM

## 2019-04-28 MED ORDER — BUPIVACAINE HCL 0.5 % IJ SOLN
0.3300 mL | INTRAMUSCULAR | Status: AC | PRN
  Administered 2019-04-28: .33 mL

## 2019-04-28 MED ORDER — LIDOCAINE HCL 1 % IJ SOLN
0.3000 mL | INTRAMUSCULAR | Status: AC | PRN
  Administered 2019-04-28: .3 mL

## 2019-04-28 MED ORDER — METHYLPREDNISOLONE ACETATE 40 MG/ML IJ SUSP
13.3300 mg | INTRAMUSCULAR | Status: AC | PRN
  Administered 2019-04-28: 13.33 mg

## 2019-04-28 NOTE — Progress Notes (Signed)
Office Visit Note   Patient: Jeffery Young           Date of Birth: March 02, 1965           MRN: [id number] Visit Date: 04/28/2019              Requested by: [name], MD [address] [place],  [place] [address] PCP: [name], MD   Assessment & Plan: Visit Diagnoses:  1. Trigger finger, left middle finger   2. Trigger index finger of right hand     Plan: Impression is questionable early trigger fingers to the left middle and right index fingers versus arthritis flareup with questionable rheumatoid component.  We proceeded with a diagnostic and hopefully therapeutic cortisone injection to the left long finger A1 pulley.  We are also drawing the autoimmune panel today.  We will call him with results.  If he does not notice relief of symptoms following the trigger finger injection, he will follow up with [place] we will look further into possible impending tendon rupture if inflammatory markers are positive.  Call with concerns or questions in the meantime.  Follow-Up Instructions: Return if symptoms worsen or fail to improve.   Orders:  No orders of the defined types were placed in this encounter.  No orders of the defined types were placed in this encounter.     Procedures: Hand/UE Inj: L long A1 for trigger finger on 04/28/2019 7:35 PM Indications: pain Details: 25 G needle Medications: 0.3 mL lidocaine 1 %; 0.33 mL bupivacaine 0.5 %; 13.33 mg methylPREDNISolone acetate 40 MG/ML Outcome: tolerated well, no immediate complications Consent was given by the patient. Patient was prepped and draped in the usual sterile fashion.       Clinical Data: No additional findings.   Subjective: Chief Complaint  Patient presents with  . Left Middle Finger - Pain  . Right Index Finger - Pain    HPI patient is a pleasant 54 year old gentleman who comes in today with his daughter.  He has been complaining of left long finger and right index finger pain for the past few  months.  No known injury.  He does note that he has had some locking, but his daughter who is present today does not think he has actually had any triggering.  He does have slight trouble fully flexing these fingers.  No history of trigger finger injections, but he does have a history of diabetes.  No personal or family history of autoimmune disease.  Review of Systems was detailed in HPI.  All others reviewed and are negative.   Objective: Vital Signs: There were no vitals taken for this visit.  Physical Exam well-developed well-nourished gentleman in no acute distress.  Alert and oriented x3.  Ortho Exam examination of the left long finger reveals no active triggering.  He does have moderate tenderness with a palpable nodule at the A1 pulley.  Right index finger shows no active triggering.  No tenderness to the A1 pulley.  Mild tenderness throughout.  Fingers are warm and well-perfused.  Specialty Comments:  No specialty comments available.  Imaging: Previous x-rays reviewed by me in canopy reveal generalized osteopenia to both hands    PMFS History: Patient Active Problem List   Diagnosis Date Noted  . COPD GOLD III  10/24/2017  . CAD (coronary artery disease), native coronary artery 10/10/2017  . ESRD (end stage renal disease) on dialysis ([place]) 06/25/2017  . Uncontrolled hypertension 05/28/2017  . Depression 05/28/2017  .  Normocytic anemia 05/28/2017  . History of completed stroke 05/28/2017  . Syncope 03/13/2016  . Ischemic cardiomyopathy 03/13/2016  . Unstable angina pectoris ([place]) 01/11/2016  . Hypertensive heart disease with heart failure ([place])   . Chronic combined systolic and diastolic heart failure ([place])   . Elevated troponin   . Chest pain 01/10/2016  . Coronary artery disease due to lipid rich plaque 01/10/2016  . Status post coronary artery stent placement   . Type 2 diabetes mellitus with diabetic nephropathy, without long-term current use of insulin ([place])   .  Tobacco abuse   . Essential hypertension   . Hypercholesteremia   . NSTEMI (non-ST elevated myocardial infarction) ([place]) 12/22/2015  . Lightheadedness 08/22/2012  . Dehydration 08/22/2012   Past Medical History:  Diagnosis Date  . Chest pain 01/10/2016  . Chronic combined systolic and diastolic heart failure ([place])   . Coronary artery disease    a.12/22/15: NSTEMI s/p overlapping DES x2 and balloon angioplasty to distal AV groove Circumflex (too small for a stent).   . Coronary artery disease due to lipid rich plaque 01/10/2016  . Dehydration 08/22/2012  . Depression   . Elevated troponin   . ESRD (end stage renal disease) on dialysis ([place])   . Essential hypertension   . History of completed stroke 05/28/2017  . Hypercholesteremia   . Hypertension   . Hypertensive heart disease with heart failure ([place])   . Ischemic cardiomyopathy 03/13/2016  . Normocytic anemia 05/28/2017  . NSTEMI (non-ST elevated myocardial infarction) ([place]) 12/22/2015  . Prostate infection   . Status post coronary artery stent placement   . Syncope 03/13/2016  . Tobacco abuse   . Type 2 diabetes mellitus with diabetic nephropathy, without long-term current use of insulin ([place])   . Type II diabetes mellitus ([place])   . Uncontrolled hypertension 05/28/2017  . Unstable angina pectoris ([place]) 01/11/2016    Family History  Problem Relation Age of Onset  . CAD Brother   . CVA Brother   . Chronic Renal Failure Brother 50       on HD  . CAD Brother     Past Surgical History:  Procedure Laterality Date  . BASCILIC VEIN TRANSPOSITION Left 06/28/2017   Procedure: LEFT UPPER ARM ARTERIOVENOUS GORTEX-GRAFT PLACEMENT;  Surgeon: Angelia Mould, MD;  Location: [facility];  Service: Vascular;  Laterality: Left;  . CARDIAC CATHETERIZATION N/A 12/22/2015   Procedure: Left Heart Cath and Coronary Angiography;  Surgeon: Burnell Blanks, MD;  Location: [facility];  Service: Cardiovascular;  Laterality: N/A;  . CARDIAC  CATHETERIZATION N/A 12/22/2015   Procedure: Coronary Stent Intervention;  Surgeon: Burnell Blanks, MD;  Location: [facility];  Service: Cardiovascular;  Laterality: N/A;  . CARDIAC CATHETERIZATION N/A 01/11/2016   Procedure: Left Heart Cath and Coronary Angiography;  Surgeon: Sherren Mocha, MD;  Location: [facility];  Service: Cardiovascular;  Laterality: N/A;  . CARDIAC CATHETERIZATION N/A 01/11/2016   Procedure: Intravascular Pressure Wire/FFR Study;  Surgeon: Sherren Mocha, MD;  Location: [facility];  Service: Cardiovascular;  Laterality: N/A;  . CARDIAC CATHETERIZATION N/A 01/11/2016   Procedure: Coronary Stent Intervention;  Surgeon: Sherren Mocha, MD;  Location: [facility];  Service: Cardiovascular;  Laterality: N/A;  . CORONARY ANGIOPLASTY WITH STENT PLACEMENT  12/22/2015  . INSERTION OF DIALYSIS CATHETER Right 06/28/2017   Procedure: INSERTION OF DIALYSIS CATHETER RIGHT INTERNAL JUGULAR;  Surgeon: Angelia Mould, MD;  Location: [facility];  Service: Vascular;  Laterality: Right;   Social History   Occupational History  . Occupation: International aid/development worker: [employer]  Tobacco Use  . Smoking status: Former Smoker    Packs/day: 0.75    Years: 29.00    Pack years: 21.75    Types: Cigarettes    Quit date: 05/28/2017    Years since quitting: 1.9  . Smokeless tobacco: Never Used  Substance and Sexual Activity  . Alcohol use: Not Currently    Alcohol/week: 7.0 standard drinks    Types: 7 Cans of beer per week    Comment: last drink was prior to last hospitalization  . Drug use: No  . Sexual activity: Not Currently

## 2019-04-30 LAB — URIC ACID: Uric Acid, Serum: 5.6 mg/dL (ref 4.0–8.0)

## 2019-04-30 LAB — ANTI-NUCLEAR AB-TITER (ANA TITER)
ANA TITER: 1:80 {titer} — ABNORMAL HIGH
ANA Titer 1: 1:80 {titer} — ABNORMAL HIGH

## 2019-04-30 LAB — ANA: Anti Nuclear Antibody (ANA): POSITIVE — AB

## 2019-04-30 LAB — RHEUMATOID FACTOR: Rheumatoid fact SerPl-aCnc: <14 IU/mL (ref <14)

## 2019-04-30 LAB — SEDIMENTATION RATE: Sed Rate: 65 mm/h — ABNORMAL HIGH (ref 0–20)

## 2019-05-01 NOTE — Progress Notes (Signed)
Will you call patient and see how he feels following trigger finger inj?  Can you also let him know that inflammatory markers are elevated and we will refer to rheumatologist.  Can you make referral for this also please!

## 2019-05-04 ENCOUNTER — Telehealth: Payer: Self-pay

## 2019-05-04 DIAGNOSIS — R768 Other specified abnormal immunological findings in serum: Secondary | ICD-10-CM

## 2019-05-04 NOTE — Telephone Encounter (Signed)
Referral Made for Rheumatology.

## 2019-05-04 NOTE — Progress Notes (Signed)
Ok, thanks.

## 2019-05-14 ENCOUNTER — Encounter: Payer: Self-pay | Admitting: Family Medicine

## 2019-05-14 ENCOUNTER — Ambulatory Visit (INDEPENDENT_AMBULATORY_CARE_PROVIDER_SITE_OTHER): Payer: BC Managed Care – PPO | Admitting: Family Medicine

## 2019-05-14 ENCOUNTER — Other Ambulatory Visit: Payer: Self-pay

## 2019-05-14 VITALS — BP 136/74 | HR 82 | Wt 130.0 lb

## 2019-05-14 DIAGNOSIS — R059 Cough, unspecified: Secondary | ICD-10-CM

## 2019-05-14 DIAGNOSIS — J449 Chronic obstructive pulmonary disease, unspecified: Secondary | ICD-10-CM | POA: Diagnosis not present

## 2019-05-14 DIAGNOSIS — F329 Major depressive disorder, single episode, unspecified: Secondary | ICD-10-CM

## 2019-05-14 DIAGNOSIS — R05 Cough: Secondary | ICD-10-CM

## 2019-05-14 DIAGNOSIS — F32A Depression, unspecified: Secondary | ICD-10-CM

## 2019-05-14 NOTE — Patient Instructions (Addendum)
  Try flonase nasal spray - 1-2 sprays daily in each nostril. Continue Breo. If no change in cough in next 2-3 weeks, I would like you to meet with pulmonary - let me know and I can arrange an appointment.   I will work on the [employer] form.   No change in Zoloft for now. I do think counseling and physical therapy will help depression as well. I sent a message to referrals about the physical therapy, but counselors ask that you cann and schedule appointment. Here are a few options, but let me know if other numbers needed.   Here are a few options for counseling:  [facility]:  [place] [phone]      If you have lab work done today you will be contacted with your lab results within the next 2 weeks.  If you have not heard from [place] then please contact us. The fastest way to get your results is to register for My Chart.   IF you received an x-ray today, you will receive an invoice from [facility]. Please contact [facility] at [phone] with questions or concerns regarding your invoice.   IF you received labwork today, you will receive an invoice from [place] [facility]. Please contact [facility] at [phone] with questions or concerns regarding your invoice.   Our billing staff will not be able to assist you with questions regarding bills from these companies.  You will be contacted with the lab results as soon as they are available. The fastest way to get your results is to activate your My Chart account. Instructions are located on the last page of this paperwork. If you have not heard from [place] regarding the results in 2 weeks, please contact this office.

## 2019-05-14 NOTE — Progress Notes (Signed)
Subjective:  Patient ID: Jeffery Young, male    DOB: December 04, 1965  Age: 54 y.o. MRN: [id number]  CC:  Chief Complaint  Patient presents with  . Depression    pt clames he is doing much better as since last visit.   Marland Kitchen Cough    pt states he no longer having the cough or congeted. pt's familys states he still dose have the cough and congeted.    HPI Jeffery Young presents for   Depression: See previous visits.  Physical therapy has been recommended as part of his reported depression symptoms more reliance on others.  He was continued on Zoloft.  Also recommended counseling. "it's ok".  Dealing with not working. Same concerns.  Has not called counseling (numbers provided last visit).  Has not had PT - referral notes form 04/17/19 - "pt does not want this referral". He does agree to this referral. Message sent to referrals to proceed.   Depression screen [facility] 2/9 05/14/2019 04/16/2019 03/19/2019 03/05/2019 11/27/2018  Decreased Interest 0 0 1 0 0  Down, Depressed, Hopeless 0 0 2 0 0  PHQ - 2 Score 0 0 3 0 0  Altered sleeping 1 - 0 - -  Tired, decreased energy 0 - 1 - -  Change in appetite 2 - 0 - -  Feeling bad or failure about yourself  0 - 0 - -  Trouble concentrating 0 - 0 - -  Moving slowly or fidgety/restless 0 - 0 - -  Suicidal thoughts 0 - 0 - -  PHQ-9 Score 3 - 4 - -  Difficult doing work/chores - - Somewhat difficult - -   Cough See previous visits.  Found to have possible component of COPD and upper airway cough syndrome with postnasal drip.  Recommended Flonase nasal spray, recommended restart of Earlie Server on March 11. Not using flonase - no side effects, just not using - no specific reason. Using Breo ellipta I puff per day.  Son in office - feels like same cough, but Jeffery Young feels like the same.  No fever. No shortness of breath.    Still living with family, ex wife. FL2 completed prior. Needs new form - prior one expired.   Had trigger finger injection 3/23 - able to use  hand better/make a fist.    History Patient Active Problem List   Diagnosis Date Noted  . COPD GOLD III  10/24/2017  . CAD (coronary artery disease), native coronary artery 10/10/2017  . ESRD (end stage renal disease) on dialysis (HCC) 06/25/2017  . Uncontrolled hypertension 05/28/2017  . Depression 05/28/2017  . Normocytic anemia 05/28/2017  . History of completed stroke 05/28/2017  . Syncope 03/13/2016  . Ischemic cardiomyopathy 03/13/2016  . Unstable angina pectoris (HCC) 01/11/2016  . Hypertensive heart disease with heart failure (HCC)   . Chronic combined systolic and diastolic heart failure (HCC)   . Elevated troponin   . Chest pain 01/10/2016  . Coronary artery disease due to lipid rich plaque 01/10/2016  . Status post coronary artery stent placement   . Type 2 diabetes mellitus with diabetic nephropathy, without long-term current use of insulin (HCC)   . Tobacco abuse   . Essential hypertension   . Hypercholesteremia   . NSTEMI (non-ST elevated myocardial infarction) (HCC) 12/22/2015  . Lightheadedness 08/22/2012  . Dehydration 08/22/2012   Past Medical History:  Diagnosis Date  . Chest pain 01/10/2016  . Chronic combined systolic and diastolic heart failure (HCC)   . Coronary artery  disease    a.12/22/15: NSTEMI s/p overlapping DES x2 and balloon angioplasty to distal AV groove Circumflex (too small for a stent).   . Coronary artery disease due to lipid rich plaque 01/10/2016  . Dehydration 08/22/2012  . Depression   . Elevated troponin   . ESRD (end stage renal disease) on dialysis (HCC)   . Essential hypertension   . History of completed stroke 05/28/2017  . Hypercholesteremia   . Hypertension   . Hypertensive heart disease with heart failure (HCC)   . Ischemic cardiomyopathy 03/13/2016  . Normocytic anemia 05/28/2017  . NSTEMI (non-ST elevated myocardial infarction) (HCC) 12/22/2015  . Prostate infection   . Status post coronary artery stent placement   .  Syncope 03/13/2016  . Tobacco abuse   . Type 2 diabetes mellitus with diabetic nephropathy, without long-term current use of insulin (HCC)   . Type II diabetes mellitus (HCC)   . Uncontrolled hypertension 05/28/2017  . Unstable angina pectoris (HCC) 01/11/2016   Past Surgical History:  Procedure Laterality Date  . BASCILIC VEIN TRANSPOSITION Left 06/28/2017   Procedure: LEFT UPPER ARM ARTERIOVENOUS GORTEX-GRAFT PLACEMENT;  Surgeon: Chuck Hint, MD;  [facility];  Service: Vascular;  Laterality: Left;  . CARDIAC CATHETERIZATION N/A 12/22/2015   Procedure: Left Heart Cath and Coronary Angiography;  Surgeon: Kathleene Hazel, MD;  Location: [facility] INVASIVE CV LAB;  Service: Cardiovascular;  Laterality: N/A;  . CARDIAC CATHETERIZATION N/A 12/22/2015   Procedure: Coronary Stent Intervention;  Surgeon: Kathleene Hazel, MD;  Location: [facility] INVASIVE CV LAB;  Service: Cardiovascular;  Laterality: N/A;  . CARDIAC CATHETERIZATION N/A 01/11/2016   Procedure: Left Heart Cath and Coronary Angiography;  Surgeon: Tonny Bollman, MD;  Location: [facility] INVASIVE CV LAB;  Service: Cardiovascular;  Laterality: N/A;  . CARDIAC CATHETERIZATION N/A 01/11/2016   Procedure: Intravascular Pressure Wire/FFR Study;  Surgeon: Tonny Bollman, MD;  Location: [facility] INVASIVE CV LAB;  Service: Cardiovascular;  Laterality: N/A;  . CARDIAC CATHETERIZATION N/A 01/11/2016   Procedure: Coronary Stent Intervention;  Surgeon: Tonny Bollman, MD;  Location: [facility] INVASIVE CV LAB;  Service: Cardiovascular;  Laterality: N/A;  . CORONARY ANGIOPLASTY WITH STENT PLACEMENT  12/22/2015  . INSERTION OF DIALYSIS CATHETER Right 06/28/2017   Procedure: INSERTION OF DIALYSIS CATHETER RIGHT INTERNAL JUGULAR;  Surgeon: Chuck Hint, MD;  Location: [facility] OR;  Service: Vascular;  Laterality: Right;   No Known Allergies Prior to Admission medications   Medication Sig Start Date End Date Taking? Authorizing Provider  aspirin 81 MG  chewable tablet CHEW ONE TABLET BY MOUTH DAILY 04/22/19  Yes Shade Flood, MD  atorvastatin (LIPITOR) 10 MG tablet Take 1 tablet (10 mg total) by mouth daily. 06/10/18  Yes Dyann Kief, PA-C  B Complex-C-Folic Acid (RENA-VITE RX) 1 MG TABS TAKE ONE TABLET BY MOUTH AT BEDTIME 04/26/19  Yes Shade Flood, MD  calcitRIOL (ROCALTROL) 0.5 MCG capsule TAKE 1 CAPSULE BY MOUTH ON MONDAY, WEDNESDAY, AND FRIDAY WITH HEMODIALYSIS 03/17/18  Yes Shade Flood, MD  carvedilol (COREG) 6.25 MG tablet Take 1 tablet (6.25 mg total) by mouth 2 (two) times daily with a meal. 06/10/18  Yes Dyann Kief, PA-C  clopidogrel (PLAVIX) 75 MG tablet TAKE ONE TABLET BY MOUTH DAILY 07/08/18  Yes Kathleene Hazel, MD  docusate sodium (STOOL SOFTENER) 100 MG capsule Take 1 capsule (100 mg total) by mouth 2 (two) times daily. 12/25/18  Yes Shade Flood, MD  fluticasone (FLONASE) 50 MCG/ACT nasal spray Place 1-2  sprays into both nostrils daily. 03/05/19  Yes Shade Flood, MD  fluticasone furoate-vilanterol (BREO ELLIPTA) 100-25 MCG/INH AEPB Inhale 1 puff into the lungs daily. 04/16/19  Yes Shade Flood, MD  glucose blood [facility] VERIO) test strip 1 each by Other route as needed for other. Use as instructed 1-2 times per day. 04/24/18  Yes Shade Flood, MD  Lancets [facility] DELICA PLUS LANCET33G) MISC USE ONELANCET TO TEST - FOUR TIMES A DAY 08/29/18  Yes Shade Flood, MD  nitroGLYCERIN (NITROSTAT) 0.4 MG SL tablet Place 1 tablet (0.4 mg total) under the tongue every 5 (five) minutes x 3 doses as needed for chest pain. 12/26/15  Yes Laverda Page B, NP  sertraline (ZOLOFT) 100 MG tablet Take 1 tablet (100 mg total) by mouth daily. 11/27/18  Yes Shade Flood, MD  sevelamer carbonate (RENVELA) 800 MG tablet Take 1 tablet (800 mg total) by mouth 3 (three) times daily with meals. 08/15/17  Yes Shade Flood, MD  STOOL SOFTENER 100 MG capsule TAKE ONE CAPSULE BY MOUTH TWICE A DAY  12/29/18  Yes Shade Flood, MD  traZODone (DESYREL) 50 MG tablet Take 1 tablet (50 mg total) by mouth at bedtime. 11/27/18  Yes Shade Flood, MD   Social History   Socioeconomic History  . Marital status: Married    Spouse name: Not on file  . Number of children: Not on file  . Years of education: Not on file  . Highest education level: Not on file  Occupational History  . Occupation: Systems analyst: [employer]  Tobacco Use  . Smoking status: Former Smoker    Packs/day: 0.75    Years: 29.00    Pack years: 21.75    Types: Cigarettes    Quit date: 05/28/2017    Years since quitting: 1.9  . Smokeless tobacco: Never Used  Substance and Sexual Activity  . Alcohol use: Not Currently    Alcohol/week: 7.0 standard drinks    Types: 7 Cans of beer per week    Comment: last drink was prior to last hospitalization  . Drug use: No  . Sexual activity: Not Currently  Other Topics Concern  . Not on file  Social History Narrative  . Not on file   Social Determinants of Health   Financial Resource Strain:   . Difficulty of Paying Living Expenses:   Food Insecurity:   . Worried About Programme researcher, broadcasting/film/video in the Last Year:   . Barista in the Last Year:   Transportation Needs:   . Freight forwarder (Medical):   Marland Kitchen Lack of Transportation (Non-Medical):   Physical Activity:   . Days of Exercise per Week:   . Minutes of Exercise per Session:   Stress:   . Feeling of Stress :   Social Connections:   . Frequency of Communication with Friends and Family:   . Frequency of Social Gatherings with Friends and Family:   . Attends Religious Services:   . Active Member of Clubs or Organizations:   . Attends Banker Meetings:   Marland Kitchen Marital Status:   Intimate Partner Violence:   . Fear of Current or Ex-Partner:   . Emotionally Abused:   Marland Kitchen Physically Abused:   . Sexually Abused:     Review of Systems   Objective:   Vitals:   05/14/19 1103   BP: 136/74  Pulse: 82  SpO2: 94%  Weight:  130 lb (59 kg)     Physical Exam Vitals reviewed.  Constitutional:      Appearance: He is well-developed.  HENT:     Head: Normocephalic and atraumatic.  Eyes:     Pupils: Pupils are equal, round, and reactive to light.  Neck:     Vascular: No carotid bruit or JVD.  Cardiovascular:     Rate and Rhythm: Normal rate and regular rhythm.     Heart sounds: Normal heart sounds. No murmur.  Pulmonary:     Effort: Pulmonary effort is normal. No respiratory distress.     Breath sounds: Normal breath sounds. No wheezing or rales.     Comments: Few coarse breath sounds in the left greater than right base but overall clear, normal effort no distress Skin:    General: Skin is warm and dry.  Neurological:     Mental Status: He is alert and oriented to person, place, and time.        Assessment & Plan:  Jeffery Young is a 54 y.o. male . Cough Chronic obstructive pulmonary disease, unspecified COPD type (HCC)  -Unfortunately has not tried Flonase nasal spray, suspect there may be a component of postnasal drip.  Minimal change with Breo but will continue same regimen for now.  If no improvement in cough with use of Flonase and Breo, would refer to pulmonary, they will call and I can place that referral without follow-up visit.  RTC precautions if acute changes  Depression, unspecified depression type  -Still suspect deconditioning, reliance on others as component of depression.  Unfortunately has not followed up with physical therapy or counseling.  He does agree to physical therapy and message sent to referrals.  Discussed need to call therapist and phone numbers were again provided.  Continue Zoloft same dose for now.  New FL 2 form to be completed.  They have had some difficulty with finding facility/visitation of facilities.  No orders of the defined types were placed in this encounter.  Patient Instructions    Try flonase nasal spray -  1-2 sprays daily in each nostril. Continue Breo. If no change in cough in next 2-3 weeks, I would like you to meet with pulmonary - let me know and I can arrange an appointment.   I will work on the [employer] form.   No change in Zoloft for now. I do think counseling and physical therapy will help depression as well. I sent a message to referrals about the physical therapy, but counselors ask that you cann and schedule appointment. Here are a few options, but let me know if other numbers needed.   Here are a few options for counseling:  [facility]:  [phone]  [facility] [phone]      If you have lab work done today you will be contacted with your lab results within the next 2 weeks.  If you have not heard from [place] then please contact us. The fastest way to get your results is to register for My Chart.   IF you received an x-ray today, you will receive an invoice from [facility]. Please contact [facility] at [phone] with questions or concerns regarding your invoice.   IF you received labwork today, you will receive an invoice from [place]. Please contact [facility] at [phone] with questions or concerns regarding your invoice.   Our billing staff will not be able to assist you with questions regarding bills from these companies.  You will be contacted  with the lab results as soon as they are available. The fastest way to get your results is to activate your My Chart account. Instructions are located on the last page of this paperwork. If you have not heard from [place] regarding the results in 2 weeks, please contact this office.         Signed, Meredith Staggers, MD [facility]

## 2019-05-21 ENCOUNTER — Telehealth: Payer: Self-pay

## 2019-05-21 NOTE — Telephone Encounter (Signed)
Following up on form completion. Sent message to Greene's asst to follow up with pt

## 2019-05-25 ENCOUNTER — Telehealth: Payer: Self-pay | Admitting: Family Medicine

## 2019-05-25 NOTE — Telephone Encounter (Signed)
Pt wife called again regarding the forms drom 4/8 visit. She is wanting a call when they are ready for her to pick up. Pts wife stated these forms are time sensitive and needs them as soon as possible. [phone] Please advise

## 2019-05-27 NOTE — Telephone Encounter (Signed)
Please Advise

## 2019-06-01 NOTE — Telephone Encounter (Signed)
I have called and spoke with pt's wife. Informed that Dr.Greene has Jeffery Young paper work and will fill it out and I will be giving them a call back once it is complete

## 2019-06-01 NOTE — Telephone Encounter (Signed)
Patient's wife called in stating she is still waiting to hear from office in regards to form. Pt's wife states this is the 4th time she is calling and still has yet to hear. Pt's family is frustrated and is wanting a call back today and to speak with Engineer, building services, as this form is holding up the process to find patient placement. Please advise and call back.

## 2019-06-03 NOTE — Telephone Encounter (Signed)
Spoke with pt's wife. They will come pick up [facility] paperwork as soon as they can.

## 2019-06-03 NOTE — Telephone Encounter (Signed)
Attempted to contact pt to inform him that his Fl2 form has been completed and is ready to be picked up. Left a VM for pt.

## 2019-06-08 ENCOUNTER — Telehealth: Payer: Self-pay | Admitting: Family Medicine

## 2019-06-08 NOTE — Telephone Encounter (Signed)
Unsure of reason for Rehab and type of facility she is looking into, called to clarify with her no answer, left a voicmail to return the call.

## 2019-06-08 NOTE — Telephone Encounter (Signed)
Pt daughter calling in putting father in a rehab center and needs more info please call her at [facility] [phone]

## 2019-06-09 ENCOUNTER — Other Ambulatory Visit: Payer: Self-pay | Admitting: Family Medicine

## 2019-06-09 DIAGNOSIS — K5900 Constipation, unspecified: Secondary | ICD-10-CM

## 2019-06-09 DIAGNOSIS — F0631 Mood disorder due to known physiological condition with depressive features: Secondary | ICD-10-CM

## 2019-06-09 DIAGNOSIS — I69398 Other sequelae of cerebral infarction: Secondary | ICD-10-CM

## 2019-06-09 NOTE — Telephone Encounter (Signed)
Requested Prescriptions  Pending Prescriptions Disp Refills  . STOOL SOFTENER 100 MG capsule [Pharmacy Med Name: STOOL SOFTENER 100 MG SOFTGEL] 90 capsule 3    Sig: TAKE ONE CAPSULE BY MOUTH TWICE A DAY     Over the Counter:  OTC Passed - 06/09/2019 10:25 PM      Passed - Valid encounter within last 12 months    Recent Outpatient Visits          3 weeks ago Cough   [facility], Ranell Patrick, MD   1 month ago Depression, unspecified depression type   [facility], Ranell Patrick, MD   2 months ago Finger pain, left   [facility], Ranell Patrick, MD   3 months ago Stiffness of finger joint, unspecified laterality   [facility], Ranell Patrick, MD   6 months ago Insomnia, unspecified type   [facility], Ranell Patrick, MD      Future Appointments            In 2 weeks Carlota Raspberry Ranell Patrick, MD [facility] [place], [facility]   In 2 months Bo Merino, MD [facility]           . sertraline (ZOLOFT) 100 MG tablet [Pharmacy Med Name: SERTRALINE HCL 100 MG TABLET] 90 tablet 0    Sig: TAKE ONE TABLET BY MOUTH DAILY     Psychiatry:  Antidepressants - SSRI Passed - 06/09/2019 10:25 PM      Passed - Completed PHQ-2 or PHQ-9 in the last 360 days.      Passed - Valid encounter within last 6 months    Recent Outpatient Visits          3 weeks ago Cough   [facility], Ranell Patrick, MD   1 month ago Depression, unspecified depression type   [facility], Ranell Patrick, MD   2 months ago Finger pain, left   [facility], Ranell Patrick, MD   3 months ago Stiffness of finger joint, unspecified laterality   [facility] [place], MD   6 months ago Insomnia, unspecified type   [facility], Ranell Patrick, MD      Future Appointments            In 2 weeks Carlota Raspberry Ranell Patrick, MD [facility] [place], [facility]   In 2 months Bo Merino, MD  [facility]

## 2019-06-09 NOTE — Telephone Encounter (Signed)
Requested Prescriptions  Pending Prescriptions Disp Refills  . sertraline (ZOLOFT) 100 MG tablet [Pharmacy Med Name: SERTRALINE HCL 100 MG TABLET] 90 tablet 1    Sig: TAKE ONE TABLET BY MOUTH DAILY     Psychiatry:  Antidepressants - SSRI Passed - 06/09/2019  6:14 AM      Passed - Completed PHQ-2 or PHQ-9 in the last 360 days.      Passed - Valid encounter within last 6 months    Recent Outpatient Visits          3 weeks ago Cough   [facility], Ranell Patrick, MD   1 month ago Depression, unspecified depression type   [facility], Ranell Patrick, MD   2 months ago Finger pain, left   [facility], Ranell Patrick, MD   3 months ago Stiffness of finger joint, unspecified laterality   [facility] [place], MD   6 months ago Insomnia, unspecified type   [facility], Ranell Patrick, MD      Future Appointments            In 2 weeks Carlota Raspberry Ranell Patrick, MD [facility] [place], [facility]   In 2 months Bo Merino, MD [facility]

## 2019-06-11 ENCOUNTER — Telehealth: Payer: Self-pay | Admitting: Family Medicine

## 2019-06-11 ENCOUNTER — Telehealth: Payer: Self-pay

## 2019-06-11 NOTE — Telephone Encounter (Signed)
Received the correct form long term fl2 for the doctor to complete. The last form that was completed by the pcp was the incorrect forr. Form has been placed at nurses station in provider box.

## 2019-06-11 NOTE — Telephone Encounter (Signed)
Please call pt back.

## 2019-06-11 NOTE — Telephone Encounter (Signed)
Patients daughter is calling back returning [facility] call  Needing info for rehab placement . Please try calling her back again. She is sorry she missed your call   Patient is needing office notes and demographics faxed to  [phone]   Peterson Ao number is [phone]

## 2019-06-11 NOTE — Telephone Encounter (Signed)
Patient daughter called again requesting to speak with McKenzie. Cb# [phone]

## 2019-06-12 NOTE — Telephone Encounter (Signed)
Paper work has been received and put into Providers box to be filled out. Pt will be contacted once paperwork is complete

## 2019-06-15 NOTE — Telephone Encounter (Signed)
No ation required at this time, closing encounter from inbox

## 2019-06-16 NOTE — Telephone Encounter (Signed)
Patient Daughter is calling  to confirm that office notes and demographics have been faxed over to rehab center ,also wants an update on new forms . Please reach out to her at [phone]

## 2019-06-16 NOTE — Telephone Encounter (Signed)
Called back and spoke to daughter just waiting on forms according to her

## 2019-06-16 NOTE — Telephone Encounter (Signed)
Spoke to pt daughter about these and updated her that they were in process.

## 2019-06-19 NOTE — Telephone Encounter (Signed)
Denton Ar is checking on status of this message concerning fl2 paperwork . Please advise at [phone].

## 2019-06-22 NOTE — Telephone Encounter (Signed)
Pt daughter called regarding the status of this paperwork. Pt would like a call [phone] Please advise.

## 2019-06-23 ENCOUNTER — Telehealth: Payer: Self-pay

## 2019-06-23 ENCOUNTER — Telehealth: Payer: Self-pay | Admitting: Family Medicine

## 2019-06-23 NOTE — Telephone Encounter (Signed)
Patient FL2 forms are in Dr Carlota Raspberry box to be signed

## 2019-06-23 NOTE — Telephone Encounter (Signed)
Pts wife calling looking for [facility] paperwork forms that need to be  completed . Patients wife  needs a phone call from someone  . Patient needs the help and we are holding up his care . She is not received any a follow up from [place] has been working on this for 3 weeks    Please call pts wife and let her know where we are on paperwork   Please call Jamis Kryder at [phone] or [phone] Daughter Denton Ar    Pt needs to pick up paperwork by tomorrow. Mid afternoon at the latest . Would

## 2019-06-23 NOTE — Telephone Encounter (Signed)
Paperwork is in the provider's urgent to be signed box.

## 2019-06-24 ENCOUNTER — Telehealth: Payer: Self-pay | Admitting: Emergency Medicine

## 2019-06-24 NOTE — Telephone Encounter (Signed)
Pt daughter called and stated that the office notes attached to those forms. She stated the fa

## 2019-06-24 NOTE — Telephone Encounter (Signed)
Pt Daughter called state she what her paper wok went she get here the paper  Need to be Fax and she will get the copies she olso what to talk to [place]  Please be Advice

## 2019-06-24 NOTE — Telephone Encounter (Signed)
Pt daughter called and stated that the office notes attached to those forms. She stated the fax is on paperwork that it needs to be sent to. Please advise.

## 2019-06-24 NOTE — Telephone Encounter (Signed)
Patient wife was informed that I have filled out patient's FL2 form and has personally given to Dr Carlota Raspberry to look over and sign. Soon as it is ready for pick up I will give her a call back.

## 2019-06-24 NOTE — Telephone Encounter (Signed)
I spoke to patient wife and she was informed we will call her once forms has been filled out

## 2019-06-25 ENCOUNTER — Ambulatory Visit: Payer: BC Managed Care – PPO | Admitting: Family Medicine

## 2019-07-05 ENCOUNTER — Other Ambulatory Visit: Payer: Self-pay | Admitting: Physician Assistant

## 2019-07-05 DIAGNOSIS — I10 Essential (primary) hypertension: Secondary | ICD-10-CM

## 2019-07-05 DIAGNOSIS — Z8673 Personal history of transient ischemic attack (TIA), and cerebral infarction without residual deficits: Secondary | ICD-10-CM

## 2019-07-16 ENCOUNTER — Ambulatory Visit: Payer: BC Managed Care – PPO | Admitting: Family Medicine

## 2019-07-17 ENCOUNTER — Encounter: Payer: Self-pay | Admitting: Family Medicine

## 2019-07-24 ENCOUNTER — Other Ambulatory Visit: Payer: Self-pay | Admitting: Physician Assistant

## 2019-07-24 DIAGNOSIS — I2510 Atherosclerotic heart disease of native coronary artery without angina pectoris: Secondary | ICD-10-CM

## 2019-08-07 NOTE — Progress Notes (Signed)
Office Visit Note  Patient: Jeffery Young             Date of Birth: August 24, 1965           MRN: [id number]             PCP: [name], MD Referring: Leandrew Koyanagi, MD Visit Date: 08/18/2019 Occupation: '@GUAROCC' @  Subjective:  No chief complaint on file.  Accompanied by his daughter Jeffery Young  History of Present Illness: Jeffery Young is a 54 y.o. male seen in consultation per request of Dr.Xu.  According to patient about 3 months ago he started having stiffness in his bilateral hands which she describes as trigger finger in his right index finger and his left middle finger.  He was having difficulty making a fist.  He was seen by Dr. Erlinda Hong who injected his left middle finger.  He states his symptoms are better but not completely resolved.  At the time he also obtained some blood work which came positive for ANA and his sedimentation rate was elevated.  He denies pain or swelling in any other joints.  There is no history of oral ulcers, nasal ulcers, malar rash, photosensitivity, Raynaud's phenomenon or joint inflammation.  There is no family history of lupus or autoimmune disease.  Activities of Daily Living:  Patient reports morning stiffness for 0 minutes.   Patient Denies nocturnal pain.  Difficulty dressing/grooming: Denies Difficulty climbing stairs: Denies Difficulty getting out of chair: Reports Difficulty using hands for taps, buttons, cutlery, and/or writing: Denies  Review of Systems  Constitutional: Negative for fatigue and night sweats.  HENT: Negative for mouth sores, mouth dryness and nose dryness.   Eyes: Negative for redness, itching and dryness.  Respiratory: Positive for cough. Negative for shortness of breath and difficulty breathing.   Cardiovascular: Negative for chest pain, palpitations, hypertension, irregular heartbeat and swelling in legs/feet.  Gastrointestinal: Negative for blood in stool, constipation and diarrhea.  Endocrine: Negative for increased  urination.  Genitourinary: Negative for difficulty urinating.  Musculoskeletal: Positive for arthralgias and joint pain. Negative for joint swelling, myalgias, muscle weakness, morning stiffness, muscle tenderness and myalgias.  Skin: Negative for color change, rash, hair loss, nodules/bumps, redness, skin tightness, ulcers and sensitivity to sunlight.  Allergic/Immunologic: Negative for susceptible to infections.  Neurological: Positive for numbness and weakness. Negative for dizziness, fainting, headaches, memory loss and night sweats.  Hematological: Negative for bruising/bleeding tendency and swollen glands.  Psychiatric/Behavioral: Negative for depressed mood, confusion and sleep disturbance. The patient is not nervous/anxious.     PMFS History:  Patient Active Problem List   Diagnosis Date Noted  . COPD GOLD III  10/24/2017  . CAD (coronary artery disease), native coronary artery 10/10/2017  . ESRD (end stage renal disease) on dialysis ([place]) 06/25/2017  . Uncontrolled hypertension 05/28/2017  . Depression 05/28/2017  . Normocytic anemia 05/28/2017  . History of completed stroke 05/28/2017  . Syncope 03/13/2016  . Ischemic cardiomyopathy 03/13/2016  . Unstable angina pectoris ([place]) 01/11/2016  . Hypertensive heart disease with heart failure ([place])   . Chronic combined systolic and diastolic heart failure ([place])   . Elevated troponin   . Chest pain 01/10/2016  . Coronary artery disease due to lipid rich plaque 01/10/2016  . Status post coronary artery stent placement   . Type 2 diabetes mellitus with diabetic nephropathy, without long-term current use of insulin ([place])   . Tobacco abuse   . Essential hypertension   . Hypercholesteremia   . NSTEMI (  non-ST elevated myocardial infarction) ([place]) 12/22/2015  . Lightheadedness 08/22/2012  . Dehydration 08/22/2012    Past Medical History:  Diagnosis Date  . Chest pain 01/10/2016  . Chronic combined systolic and diastolic heart  failure ([place])   . Coronary artery disease    a.12/22/15: NSTEMI s/p overlapping DES x2 and balloon angioplasty to distal AV groove Circumflex (too small for a stent).   . Coronary artery disease due to lipid rich plaque 01/10/2016  . Dehydration 08/22/2012  . Depression   . Elevated troponin   . ESRD (end stage renal disease) on dialysis ([place])   . Essential hypertension   . History of completed stroke 05/28/2017  . Hypercholesteremia   . Hypertension   . Hypertensive heart disease with heart failure ([place])   . Ischemic cardiomyopathy 03/13/2016  . Normocytic anemia 05/28/2017  . NSTEMI (non-ST elevated myocardial infarction) ([place]) 12/22/2015  . Prostate infection   . Status post coronary artery stent placement   . Syncope 03/13/2016  . Tobacco abuse   . Type 2 diabetes mellitus with diabetic nephropathy, without long-term current use of insulin ([place])   . Type II diabetes mellitus ([place])   . Uncontrolled hypertension 05/28/2017  . Unstable angina pectoris ([place]) 01/11/2016    Family History  Problem Relation Age of Onset  . CAD Brother   . CVA Brother   . Chronic Renal Failure Brother 50       on HD  . Hypertension Mother   . Diabetes Mother   . Rheum arthritis Mother   . Healthy Daughter   . Healthy Son    Past Surgical History:  Procedure Laterality Date  . BASCILIC VEIN TRANSPOSITION Left 06/28/2017   Procedure: LEFT UPPER ARM ARTERIOVENOUS GORTEX-GRAFT PLACEMENT;  Surgeon: Angelia Mould, MD;  Location: [facility];  Service: Vascular;  Laterality: Left;  . CARDIAC CATHETERIZATION N/A 12/22/2015   Procedure: Left Heart Cath and Coronary Angiography;  Surgeon: Burnell Blanks, MD;  Location: [facility];  Service: Cardiovascular;  Laterality: N/A;  . CARDIAC CATHETERIZATION N/A 12/22/2015   Procedure: Coronary Stent Intervention;  Surgeon: Burnell Blanks, MD;  Location: [facility];  Service: Cardiovascular;  Laterality: N/A;  . CARDIAC CATHETERIZATION  N/A 01/11/2016   Procedure: Left Heart Cath and Coronary Angiography;  Surgeon: Sherren Mocha, MD;  Location: [facility];  Service: Cardiovascular;  Laterality: N/A;  . CARDIAC CATHETERIZATION N/A 01/11/2016   Procedure: Intravascular Pressure Wire/FFR Study;  Surgeon: Sherren Mocha, MD;  Location: [facility];  Service: Cardiovascular;  Laterality: N/A;  . CARDIAC CATHETERIZATION N/A 01/11/2016   Procedure: Coronary Stent Intervention;  Surgeon: Sherren Mocha, MD;  Location: [facility];  Service: Cardiovascular;  Laterality: N/A;  . CORONARY ANGIOPLASTY WITH STENT PLACEMENT  12/22/2015  . INSERTION OF DIALYSIS CATHETER Right 06/28/2017   Procedure: INSERTION OF DIALYSIS CATHETER RIGHT INTERNAL JUGULAR;  Surgeon: Angelia Mould, MD;  Location: [facility] OR;  Service: Vascular;  Laterality: Right;   Social History   Social History Narrative  . Not on file   Immunization History  Administered Date(s) Administered  . Pneumococcal Conjugate-13 06/06/2018  . Tdap 03/09/2016     Objective: Vital Signs: BP 113/63 (BP Location: Right Arm, Patient Position: Sitting, Cuff Size: Normal)   Pulse 73   Resp 15   Ht '5\' 5"'  (1.651 m)   Wt 126 lb (57.2 kg)   BMI 20.97 kg/m    Physical Exam Vitals and nursing note reviewed.  Constitutional:      Appearance: He is well-developed.  HENT:     Head: Normocephalic and atraumatic.  Eyes:     Conjunctiva/sclera: Conjunctivae normal.     Pupils: Pupils are equal, round, and reactive to light.  Cardiovascular:     Rate and Rhythm: Normal rate and regular rhythm.     Heart sounds: Normal heart sounds.  Pulmonary:     Effort: Pulmonary effort is normal.     Breath sounds: Normal breath sounds.  Abdominal:     General: Bowel sounds are normal.     Palpations: Abdomen is soft.  Musculoskeletal:     Cervical back: Normal range of motion and neck supple.  Skin:    General: Skin is warm and dry.     Capillary Refill: Capillary  refill takes less than 2 seconds.  Neurological:     Mental Status: He is alert and oriented to person, place, and time.     Comments: Left-sided hemiparesis  Psychiatric:        Behavior: Behavior normal.      Musculoskeletal Exam: Patient has left-sided hemiparesis.  C-spine was in good range of motion.  Left shoulder joint abduction was limited 220 degrees.  Elbow joints with good range of motion.  He had no MCP PIP or DIP synovitis.  He had right index finger flexor tenosynovitis.  Left third trigger finger is better.  Knee joints ankles MTPs with good range of motion with no synovitis.  CDAI Exam: CDAI Score: -- Patient Global: --; Provider Global: -- Swollen: --; Tender: -- Joint Exam 08/18/2019   No joint exam has been documented for this visit   There is currently no information documented on the homunculus. Go to the Rheumatology activity and complete the homunculus joint exam.  Investigation: No additional findings.  Imaging: No results found.  Recent Labs: Lab Results  Component Value Date   WBC 8.4 11/10/2018   HGB 11.0 (L) 11/10/2018   PLT 125 (L) 11/10/2018   NA 137 03/19/2019   K 4.7 03/19/2019   CL 92 (L) 03/19/2019   CO2 26 03/19/2019   GLUCOSE 141 (H) 03/19/2019   BUN 57 (H) 03/19/2019   CREATININE 5.96 (HH) 03/19/2019   BILITOT <0.2 03/19/2019   ALKPHOS 133 (H) 03/19/2019   AST 13 03/19/2019   ALT 12 03/19/2019   PROT 7.8 03/19/2019   ALBUMIN 4.2 03/19/2019   CALCIUM 8.8 03/19/2019   GFRAA 11 (L) 03/19/2019    Speciality Comments: No specialty comments available.  Procedures:  No procedures performed Allergies: Patient has no known allergies.   Assessment / Plan:     Visit Diagnoses: Positive ANA (antinuclear antibody) - 04/28/19: ANA 1:80cytoplasmic, 1:80NS, ESR 65, RF-, Uric acid 5.6 -patient has no synovitis on examination.  He denies any history of oral ulcers, nasal ulcers, malar rash, photosensitivity, Raynaud's phenomenon or  lymphadenopathy.  He has had history of MI and a stroke in the past.  I will obtain additional antibodies today.  His sed rate was also elevated which I am uncertain about the etiology.  Plan: RNP Antibody, Anti-Smith antibody, Sjogrens syndrome-A extractable nuclear antibody, Sjogrens syndrome-B extractable nuclear antibody, Anti-DNA antibody, double-stranded, C3 and C4, Beta-2 glycoprotein antibodies, Cardiolipin antibodies, IgG, IgM, IgA, Lupus Anticoagulant Eval w/Reflex, Sedimentation rate  Trigger finger, left middle finger-improved after the injection.  Trigger finger, right index finger-he is intermittent triggering of the right  index finger.  Other medical problems are listed as follows:  Coronary artery disease  involving native coronary artery of native heart without angina pectoris  Status post coronary artery stent placement  Chronic combined systolic and diastolic heart failure (HCC)  Essential hypertension  Ischemic cardiomyopathy  NSTEMI (non-ST elevated myocardial infarction) ([place])  History of stroke - 2019, left-sided hemiparesis  COPD GOLD III   Type 2 diabetes mellitus with diabetic nephropathy, without long-term current use of insulin (HCC)  ESRD (end stage renal disease) on dialysis ([place])  Hypercholesteremia  Normocytic anemia  Former smoker - Quit smoking 2019.  Twopacks per day for 20 years.  Orders: Orders Placed This Encounter  Procedures  . RNP Antibody  . Anti-Smith antibody  . Sjogrens syndrome-A extractable nuclear antibody  . Sjogrens syndrome-B extractable nuclear antibody  . Anti-DNA antibody, double-stranded  . C3 and C4  . Beta-2 glycoprotein antibodies  . Cardiolipin antibodies, IgG, IgM, IgA  . Lupus Anticoagulant Eval w/Reflex  . Sedimentation rate   No orders of the defined types were placed in this encounter.     Follow-Up Instructions: Return for +ANA.   Bo Merino, MD  Note - This record has been created using  Editor, commissioning.  Chart creation errors have been sought, but may not always  have been located. Such creation errors do not reflect on  the standard of medical care.

## 2019-08-10 ENCOUNTER — Other Ambulatory Visit: Payer: Self-pay | Admitting: Physician Assistant

## 2019-08-10 ENCOUNTER — Other Ambulatory Visit: Payer: Self-pay | Admitting: Family Medicine

## 2019-08-10 DIAGNOSIS — I2510 Atherosclerotic heart disease of native coronary artery without angina pectoris: Secondary | ICD-10-CM

## 2019-08-10 DIAGNOSIS — F32A Depression, unspecified: Secondary | ICD-10-CM

## 2019-08-13 ENCOUNTER — Ambulatory Visit: Payer: BC Managed Care – PPO | Admitting: Rheumatology

## 2019-08-18 ENCOUNTER — Other Ambulatory Visit: Payer: Self-pay

## 2019-08-18 ENCOUNTER — Encounter: Payer: Self-pay | Admitting: Rheumatology

## 2019-08-18 ENCOUNTER — Ambulatory Visit (INDEPENDENT_AMBULATORY_CARE_PROVIDER_SITE_OTHER): Payer: BC Managed Care – PPO | Admitting: Rheumatology

## 2019-08-18 VITALS — BP 113/63 | HR 73 | Resp 15 | Ht 65.0 in | Wt 126.0 lb

## 2019-08-18 DIAGNOSIS — J449 Chronic obstructive pulmonary disease, unspecified: Secondary | ICD-10-CM

## 2019-08-18 DIAGNOSIS — N186 End stage renal disease: Secondary | ICD-10-CM

## 2019-08-18 DIAGNOSIS — I2510 Atherosclerotic heart disease of native coronary artery without angina pectoris: Secondary | ICD-10-CM

## 2019-08-18 DIAGNOSIS — R768 Other specified abnormal immunological findings in serum: Secondary | ICD-10-CM | POA: Diagnosis not present

## 2019-08-18 DIAGNOSIS — M65321 Trigger finger, right index finger: Secondary | ICD-10-CM | POA: Diagnosis not present

## 2019-08-18 DIAGNOSIS — D649 Anemia, unspecified: Secondary | ICD-10-CM

## 2019-08-18 DIAGNOSIS — I10 Essential (primary) hypertension: Secondary | ICD-10-CM

## 2019-08-18 DIAGNOSIS — M65332 Trigger finger, left middle finger: Secondary | ICD-10-CM | POA: Diagnosis not present

## 2019-08-18 DIAGNOSIS — I5042 Chronic combined systolic (congestive) and diastolic (congestive) heart failure: Secondary | ICD-10-CM

## 2019-08-18 DIAGNOSIS — I255 Ischemic cardiomyopathy: Secondary | ICD-10-CM

## 2019-08-18 DIAGNOSIS — E1121 Type 2 diabetes mellitus with diabetic nephropathy: Secondary | ICD-10-CM

## 2019-08-18 DIAGNOSIS — E7800 Pure hypercholesterolemia, unspecified: Secondary | ICD-10-CM

## 2019-08-18 DIAGNOSIS — Z955 Presence of coronary angioplasty implant and graft: Secondary | ICD-10-CM

## 2019-08-18 DIAGNOSIS — Z87891 Personal history of nicotine dependence: Secondary | ICD-10-CM

## 2019-08-18 DIAGNOSIS — I214 Non-ST elevation (NSTEMI) myocardial infarction: Secondary | ICD-10-CM

## 2019-08-18 DIAGNOSIS — Z992 Dependence on renal dialysis: Secondary | ICD-10-CM

## 2019-08-18 DIAGNOSIS — Z8673 Personal history of transient ischemic attack (TIA), and cerebral infarction without residual deficits: Secondary | ICD-10-CM

## 2019-08-19 LAB — SJOGRENS SYNDROME-B EXTRACTABLE NUCLEAR ANTIBODY: SSB (La) (ENA) Antibody, IgG: <1 AI

## 2019-08-19 LAB — SEDIMENTATION RATE: Sed Rate: 46 mm/h — ABNORMAL HIGH (ref 0–20)

## 2019-08-19 LAB — ANTI-SMITH ANTIBODY: ENA SM Ab Ser-aCnc: <1 AI

## 2019-08-19 LAB — RNP ANTIBODY: Ribonucleic Protein(ENA) Antibody, IgG: <1 AI

## 2019-08-19 LAB — CARDIOLIPIN ANTIBODIES, IGG, IGM, IGA
Anticardiolipin IgA: 4.3 APL-U/mL
Anticardiolipin IgG: <2 GPL-U/mL
Anticardiolipin IgM: 3.8 MPL-U/mL

## 2019-08-19 LAB — SJOGRENS SYNDROME-A EXTRACTABLE NUCLEAR ANTIBODY: SSA (Ro) (ENA) Antibody, IgG: <1 AI

## 2019-08-19 LAB — BETA-2 GLYCOPROTEIN ANTIBODIES
Beta-2 Glyco 1 IgA: 3.8 U/mL
Beta-2 Glyco 1 IgM: 4.7 U/mL
Beta-2 Glyco I IgG: <2 U/mL

## 2019-08-19 LAB — C3 AND C4
C3 Complement: 100 mg/dL (ref 82–185)
C4 Complement: 30 mg/dL (ref 15–53)

## 2019-08-19 LAB — ANTI-DNA ANTIBODY, DOUBLE-STRANDED: ds DNA Ab: 3 IU/mL

## 2019-08-19 NOTE — Progress Notes (Signed)
I will discuss results at the follow-up visit.

## 2019-09-03 ENCOUNTER — Other Ambulatory Visit: Payer: Self-pay

## 2019-09-03 ENCOUNTER — Encounter: Payer: Self-pay | Admitting: Rheumatology

## 2019-09-03 ENCOUNTER — Ambulatory Visit (INDEPENDENT_AMBULATORY_CARE_PROVIDER_SITE_OTHER): Payer: BC Managed Care – PPO | Admitting: Rheumatology

## 2019-09-03 VITALS — BP 117/67 | HR 66 | Resp 15 | Ht 65.0 in | Wt 124.0 lb

## 2019-09-03 DIAGNOSIS — R700 Elevated erythrocyte sedimentation rate: Secondary | ICD-10-CM | POA: Diagnosis not present

## 2019-09-03 DIAGNOSIS — M65321 Trigger finger, right index finger: Secondary | ICD-10-CM

## 2019-09-03 DIAGNOSIS — R768 Other specified abnormal immunological findings in serum: Secondary | ICD-10-CM | POA: Diagnosis not present

## 2019-09-03 DIAGNOSIS — I2510 Atherosclerotic heart disease of native coronary artery without angina pectoris: Secondary | ICD-10-CM

## 2019-09-03 DIAGNOSIS — D649 Anemia, unspecified: Secondary | ICD-10-CM

## 2019-09-03 DIAGNOSIS — N186 End stage renal disease: Secondary | ICD-10-CM

## 2019-09-03 DIAGNOSIS — I5042 Chronic combined systolic (congestive) and diastolic (congestive) heart failure: Secondary | ICD-10-CM

## 2019-09-03 DIAGNOSIS — I10 Essential (primary) hypertension: Secondary | ICD-10-CM

## 2019-09-03 DIAGNOSIS — J449 Chronic obstructive pulmonary disease, unspecified: Secondary | ICD-10-CM

## 2019-09-03 DIAGNOSIS — M65332 Trigger finger, left middle finger: Secondary | ICD-10-CM

## 2019-09-03 DIAGNOSIS — E7800 Pure hypercholesterolemia, unspecified: Secondary | ICD-10-CM

## 2019-09-03 DIAGNOSIS — Z955 Presence of coronary angioplasty implant and graft: Secondary | ICD-10-CM

## 2019-09-03 DIAGNOSIS — Z8673 Personal history of transient ischemic attack (TIA), and cerebral infarction without residual deficits: Secondary | ICD-10-CM

## 2019-09-03 DIAGNOSIS — I255 Ischemic cardiomyopathy: Secondary | ICD-10-CM

## 2019-09-03 DIAGNOSIS — Z87891 Personal history of nicotine dependence: Secondary | ICD-10-CM

## 2019-09-03 DIAGNOSIS — Z992 Dependence on renal dialysis: Secondary | ICD-10-CM

## 2019-09-03 DIAGNOSIS — E1121 Type 2 diabetes mellitus with diabetic nephropathy: Secondary | ICD-10-CM

## 2019-09-03 DIAGNOSIS — I214 Non-ST elevation (NSTEMI) myocardial infarction: Secondary | ICD-10-CM

## 2019-09-03 NOTE — Progress Notes (Signed)
Office Visit Note  Patient: Jeffery Young             Date of Birth: Jan 13, 1966           MRN: [id number]             PCP: [name], MD Referring: [name], MD Visit Date: 09/03/2019 Occupation: '@GUAROCC' @  Subjective:  Positive ANA.   History of Present Illness: Jeffery Young is a 54 y.o. male with history of trigger finger and positive ANA.  He denies any history of oral ulcers, nasal ulcers, malar rash, photosensitivity or Raynaud's phenomenon.  He still has trouble with trigger finger.  He was referred by Dr. Erlinda Hong for evaluation of positive ANA.  Activities of Daily Living:  Patient reports morning stiffness for 0 minutes.   Patient Denies nocturnal pain.  Difficulty dressing/grooming: Denies Difficulty climbing stairs: Reports Difficulty getting out of chair: Reports Difficulty using hands for taps, buttons, cutlery, and/or writing: Reports  Review of Systems  Constitutional: Positive for fatigue.  HENT: Negative for mouth sores, mouth dryness and nose dryness.   Eyes: Negative for itching and dryness.  Respiratory: Negative for shortness of breath and difficulty breathing.   Cardiovascular: Negative for chest pain and palpitations.  Gastrointestinal: Negative for blood in stool, constipation and diarrhea.  Endocrine: Negative for increased urination.  Genitourinary: Negative for difficulty urinating.  Musculoskeletal: Negative for arthralgias, joint pain, joint swelling, myalgias, morning stiffness, muscle tenderness and myalgias.  Skin: Negative for color change, rash and redness.  Allergic/Immunologic: Negative for susceptible to infections.  Neurological: Negative for dizziness, numbness, headaches, memory loss and weakness.  Hematological: Negative for bruising/bleeding tendency.  Psychiatric/Behavioral: Negative for confusion.    PMFS History:  Patient Active Problem List   Diagnosis Date Noted  . COPD GOLD III  10/24/2017  . CAD (coronary artery  disease), native coronary artery 10/10/2017  . ESRD (end stage renal disease) on dialysis ([facility]) 06/25/2017  . Uncontrolled hypertension 05/28/2017  . Depression 05/28/2017  . Normocytic anemia 05/28/2017  . History of completed stroke 05/28/2017  . Syncope 03/13/2016  . Ischemic cardiomyopathy 03/13/2016  . Unstable angina pectoris ([facility]) 01/11/2016  . Hypertensive heart disease with heart failure ([facility])   . Chronic combined systolic and diastolic heart failure ([place])   . Elevated troponin   . Chest pain 01/10/2016  . Coronary artery disease due to lipid rich plaque 01/10/2016  . Status post coronary artery stent placement   . Type 2 diabetes mellitus with diabetic nephropathy, without long-term current use of insulin ([facility])   . Tobacco abuse   . Essential hypertension   . Hypercholesteremia   . NSTEMI (non-ST elevated myocardial infarction) ([facility] [place]) 12/22/2015  . Lightheadedness 08/22/2012  . Dehydration 08/22/2012    Past Medical History:  Diagnosis Date  . Chest pain 01/10/2016  . Chronic combined systolic and diastolic heart failure ([facility])   . Coronary artery disease    a.12/22/15: NSTEMI s/p overlapping DES x2 and balloon angioplasty to distal AV groove Circumflex (too small for a stent).   . Coronary artery disease due to lipid rich plaque 01/10/2016  . Dehydration 08/22/2012  . Depression   . Elevated troponin   . ESRD (end stage renal disease) on dialysis ([place])   . Essential hypertension   . History of completed stroke 05/28/2017  . Hypercholesteremia   . Hypertension   . Hypertensive heart disease with heart failure ([facility])   . Ischemic cardiomyopathy 03/13/2016  . Normocytic anemia 05/28/2017  .  NSTEMI (non-ST elevated myocardial infarction) ([facility]) 12/22/2015  . Prostate infection   . Status post coronary artery stent placement   . Syncope 03/13/2016  . Tobacco abuse   . Type 2 diabetes mellitus with diabetic nephropathy, without long-term current use of insulin ([place])   . Type II  diabetes mellitus ([place])   . Uncontrolled hypertension 05/28/2017  . Unstable angina pectoris ([facility]) 01/11/2016    Family History  Problem Relation Age of Onset  . CAD Brother   . CVA Brother   . Chronic Renal Failure Brother 50       on HD  . Hypertension Mother   . Diabetes Mother   . Rheum arthritis Mother   . Healthy Daughter   . Healthy Son    Past Surgical History:  Procedure Laterality Date  . BASCILIC VEIN TRANSPOSITION Left 06/28/2017   Procedure: LEFT UPPER ARM ARTERIOVENOUS GORTEX-GRAFT PLACEMENT;  Surgeon: Angelia Mould, MD;  Location: [facility];  Service: Vascular;  Laterality: Left;  . CARDIAC CATHETERIZATION N/A 12/22/2015   Procedure: Left Heart Cath and Coronary Angiography;  Surgeon: Burnell Blanks, MD;  Location: [facility];  Service: Cardiovascular;  Laterality: N/A;  . CARDIAC CATHETERIZATION N/A 12/22/2015   Procedure: Coronary Stent Intervention;  Surgeon: Burnell Blanks, MD;  Location: [facility];  Service: Cardiovascular;  Laterality: N/A;  . CARDIAC CATHETERIZATION N/A 01/11/2016   Procedure: Left Heart Cath and Coronary Angiography;  Surgeon: Sherren Mocha, MD;  Location: [facility];  Service: Cardiovascular;  Laterality: N/A;  . CARDIAC CATHETERIZATION N/A 01/11/2016   Procedure: Intravascular Pressure Wire/FFR Study;  Surgeon: Sherren Mocha, MD;  Location: [facility];  Service: Cardiovascular;  Laterality: N/A;  . CARDIAC CATHETERIZATION N/A 01/11/2016   Procedure: Coronary Stent Intervention;  Surgeon: Sherren Mocha, MD;  Location: [facility];  Service: Cardiovascular;  Laterality: N/A;  . CORONARY ANGIOPLASTY WITH STENT PLACEMENT  12/22/2015  . INSERTION OF DIALYSIS CATHETER Right 06/28/2017   Procedure: INSERTION OF DIALYSIS CATHETER RIGHT INTERNAL JUGULAR;  Surgeon: Angelia Mould, MD;  Location: [facility];  Service: Vascular;  Laterality: Right;   Social History   Social History Narrative    . Not on file   Immunization History  Administered Date(s) Administered  . Pneumococcal Conjugate-13 06/06/2018  . Tdap 03/09/2016     Objective: Vital Signs: BP 117/67 (BP Location: Right Arm, Patient Position: Sitting, Cuff Size: Normal)   Pulse 66   Resp 15   Ht '5\' 5"'  (1.651 m)   Wt 124 lb (56.2 kg)   BMI 20.63 kg/m    Physical Exam Vitals (He came in a wheelchair.  He usually uses walker at home.) and nursing note reviewed.  Constitutional:      Appearance: He is well-developed.  HENT:     Head: Normocephalic and atraumatic.  Eyes:     Conjunctiva/sclera: Conjunctivae normal.     Pupils: Pupils are equal, round, and reactive to light.  Cardiovascular:     Rate and Rhythm: Normal rate and regular rhythm.     Heart sounds: Normal heart sounds.  Pulmonary:     Effort: Pulmonary effort is normal.     Breath sounds: Normal breath sounds.  Abdominal:     General: Bowel sounds are normal.     Palpations: Abdomen is soft.  Musculoskeletal:     Cervical back: Normal range of motion and neck supple.  Skin:    General: Skin is warm and dry.  Capillary Refill: Capillary refill takes less than 2 seconds.  Neurological:     Mental Status: He is alert and oriented to person, place, and time.     Comments: Neurological deficit due to previous stroke.  Psychiatric:        Behavior: Behavior normal.      Musculoskeletal Exam: C-spine was in good range of motion.  Shoulder joints, elbow joints, wrist joints, MCPs PIPs and DIPs with good range of motion with no synovitis.  He had good range of motion of his knee joints and ankle joints.  No synovitis was noted.  CDAI Exam: CDAI Score: -- Patient Global: --; Provider Global: -- Swollen: --; Tender: -- Joint Exam 09/03/2019   No joint exam has been documented for this visit   There is currently no information documented on the homunculus. Go to the Rheumatology activity and complete the homunculus joint  exam.  Investigation: No additional findings.  Imaging: No results found.  Recent Labs: Lab Results  Component Value Date   WBC 8.4 11/10/2018   HGB 11.0 (L) 11/10/2018   PLT 125 (L) 11/10/2018   NA 137 03/19/2019   K 4.7 03/19/2019   CL 92 (L) 03/19/2019   CO2 26 03/19/2019   GLUCOSE 141 (H) 03/19/2019   BUN 57 (H) 03/19/2019   CREATININE 5.96 (HH) 03/19/2019   BILITOT <0.2 03/19/2019   ALKPHOS 133 (H) 03/19/2019   AST 13 03/19/2019   ALT 12 03/19/2019   PROT 7.8 03/19/2019   ALBUMIN 4.2 03/19/2019   CALCIUM 8.8 03/19/2019   GFRAA 11 (L) 03/19/2019   August 18, 2019 ESR 46, ENA negative, C3-C4 normal, anticardiolipin negative, beta-2 GP 1 - 04/28/19: ANA 1:80cytoplasmic, 1:80NS, ESR 65, RF-, Uric acid 5.6  Speciality Comments: No specialty comments available.  Procedures:  No procedures performed Allergies: Patient has no known allergies.   Assessment / Plan:     Visit Diagnoses: Positive ANA (antinuclear antibody) - ENA negative, C3-C4 normal, beta-2 negative, anticardiolipin negative.  Patient had no clinical features of autoimmune disease.  I detailed discussion with the patient regarding his lab work.  No further evaluation is needed.  I have advised him to contact me in case he develops any new symptoms.  Elevated sed rate-repeat sed rate fits better but is still elevated.  Trigger finger, left middle finger-followed by Dr. Erlinda Hong.  Trigger finger, right index finger  Coronary artery disease involving native coronary artery of native heart without angina pectoris  Status post coronary artery stent placement  Chronic combined systolic and diastolic heart failure ([place])  Essential hypertension  Ischemic cardiomyopathy  NSTEMI (non-ST elevated myocardial infarction) ([place])  History of stroke  COPD GOLD III   Type 2 diabetes mellitus with diabetic nephropathy, without long-term current use of insulin ([facility])  ESRD (end stage renal disease) on dialysis  ([place])  Hypercholesteremia  Normocytic anemia  Former smoker  Orders: No orders of the defined types were placed in this encounter.  No orders of the defined types were placed in this encounter.    Follow-Up Instructions: Return for ANA+.   Bo Merino, MD  Note - This record has been created using Editor, commissioning.  Chart creation errors have been sought, but may not always  have been located. Such creation errors do not reflect on  the standard of medical care.

## 2019-09-10 ENCOUNTER — Ambulatory Visit: Payer: BC Managed Care – PPO | Admitting: Adult Health

## 2019-10-05 ENCOUNTER — Other Ambulatory Visit (HOSPITAL_COMMUNITY): Payer: Self-pay | Admitting: *Deleted

## 2019-10-05 DIAGNOSIS — R1310 Dysphagia, unspecified: Secondary | ICD-10-CM

## 2019-10-08 ENCOUNTER — Ambulatory Visit (HOSPITAL_COMMUNITY)
Admission: RE | Admit: 2019-10-08 | Discharge: 2019-10-08 | Disposition: A | Discharge status: Home / Self Care | Payer: BC Managed Care – PPO | Source: Ambulatory Visit | Attending: Family Medicine | Admitting: Family Medicine

## 2019-10-08 ENCOUNTER — Other Ambulatory Visit: Payer: Self-pay

## 2019-10-08 DIAGNOSIS — R1310 Dysphagia, unspecified: Secondary | ICD-10-CM | POA: Insufficient documentation

## 2019-10-08 DIAGNOSIS — R1313 Dysphagia, pharyngeal phase: Secondary | ICD-10-CM | POA: Insufficient documentation

## 2019-10-08 DIAGNOSIS — N186 End stage renal disease: Secondary | ICD-10-CM | POA: Insufficient documentation

## 2019-10-08 DIAGNOSIS — Z8673 Personal history of transient ischemic attack (TIA), and cerebral infarction without residual deficits: Secondary | ICD-10-CM | POA: Insufficient documentation

## 2019-10-08 NOTE — Progress Notes (Signed)
Modified Barium Swallow Progress Note  Patient Details  Name: Jeffery Young MRN: [id number] Date of Birth: October 17, 1965  Today's Date: 10/08/2019  Modified Barium Swallow completed.  Full report located under Chart Review in the Imaging Section.  Brief recommendations include the following:  Clinical Impression  Pt presents with pharyngeal dysphagia characterized by a pharyngeal delay which facilitated penetration (PAS 3) of thin liquids via straw when large boluses were consumed. The swallow was triggered at the level of the pyriform sinuses with liquids and at the vallculae with the solids. His swallow mechanism was otherwise within functional limits without other instances of laryngeal invasion or any pharyngeal residue. A regular texture diet with thin liquids is recommended at this time with observance of swallowing precautions to reduce aspiration risk. Pt was educated regarding the results of the modified barium swallow study and swallowing precautions listed below. He verbalized understanding as well as agreement.    Swallow Evaluation Recommendations       SLP Diet Recommendations: Regular solids;Thin liquid   Liquid Administration via: Cup; Avoid straws   Medication Administration: Whole meds with liquid   Supervision: Patient able to self feed   Compensations: Slow rate;Small sips/bites   Postural Changes: Seated upright at 90 degrees   Oral Care Recommendations: Oral care BID      Jeffery Young, Jeffery Young, Jeffery Young Office number [phone] Pager [phone]   Horton Marshall 10/08/2019,1:27 PM

## 2019-10-13 ENCOUNTER — Encounter: Payer: Self-pay | Admitting: Adult Health

## 2019-10-13 ENCOUNTER — Other Ambulatory Visit: Payer: Self-pay

## 2019-10-13 ENCOUNTER — Ambulatory Visit (INDEPENDENT_AMBULATORY_CARE_PROVIDER_SITE_OTHER): Payer: BC Managed Care – PPO | Admitting: Adult Health

## 2019-10-13 VITALS — BP 118/71 | HR 71 | Ht 65.0 in | Wt 127.6 lb

## 2019-10-13 DIAGNOSIS — I69354 Hemiplegia and hemiparesis following cerebral infarction affecting left non-dominant side: Secondary | ICD-10-CM | POA: Diagnosis not present

## 2019-10-13 DIAGNOSIS — E785 Hyperlipidemia, unspecified: Secondary | ICD-10-CM

## 2019-10-13 DIAGNOSIS — I10 Essential (primary) hypertension: Secondary | ICD-10-CM

## 2019-10-13 DIAGNOSIS — F0631 Mood disorder due to known physiological condition with depressive features: Secondary | ICD-10-CM

## 2019-10-13 DIAGNOSIS — I6930 Unspecified sequelae of cerebral infarction: Secondary | ICD-10-CM | POA: Diagnosis not present

## 2019-10-13 DIAGNOSIS — I69398 Other sequelae of cerebral infarction: Secondary | ICD-10-CM

## 2019-10-13 DIAGNOSIS — Z8673 Personal history of transient ischemic attack (TIA), and cerebral infarction without residual deficits: Secondary | ICD-10-CM

## 2019-10-13 DIAGNOSIS — I69322 Dysarthria following cerebral infarction: Secondary | ICD-10-CM

## 2019-10-13 DIAGNOSIS — I69319 Unspecified symptoms and signs involving cognitive functions following cerebral infarction: Secondary | ICD-10-CM

## 2019-10-13 MED ORDER — CLOPIDOGREL BISULFATE 75 MG PO TABS: 75.0000 mg | ORAL_TABLET | Freq: Every day | ORAL | 0 refills | Status: AC

## 2019-10-13 NOTE — Patient Instructions (Addendum)
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 Will continue sertraline 100 mg daily at this time  Continue aspirin, Plavix and atorvastatin for secondary stroke prevention and history of cardiac stents Ensure close PCP/facility follow-up for aggressive stroke risk factor management including HTN with BP goal<130/90 and HLD with LDL goal<70    Overall stable from a stroke standpoint recommend follow-up on an as-needed basis      Thank you for coming to see [place] at [facility]. I hope we have been able to provide you high quality care today.  You may receive a patient satisfaction survey over the next few weeks. We would appreciate your feedback and comments so that we may continue to improve ourselves and the health of our patients.

## 2019-10-13 NOTE — Progress Notes (Signed)
[facility] [address] [place]. [place] [address] [phone]       FOLLOW UP NOTE  Jeffery Young Date of Birth:  22-Mar-1965 Medical Record Number:  [id number]   Referring MD:  Jamison Neighbor Reason for visit: Stroke   Chief Complaint  Patient presents with  . Follow-up    6 month f/u, states he has been doing well since last visit. Denies any new issues.   . treatment room    with son      HPI:  Today, 10/13/2019, [name] returns for stroke follow-up accompanied by his son  Since prior visit, he has transitioned to [facility]  Patient reports residual deficits of left-sided weakness, dysarthria, gait impairment and cognitive impairment.  All deficits have been stable without worsening. He is currently working with therapies at facility but is frustrated as he is currently nonambulatory transfers via wheelchair due to frequent falls as well as continued residual deficits. He also reports worsening depression since transitioning to SNF -apparently transferred approximately 2 weeks ago.  Currently on sertraline 100 mg daily. Denies new stroke/TIA symptoms  Remains on Plavix and aspirin without bleeding or bruising Remains on atorvastatin without myalgias Blood pressure today 118/71 Continues to follow closely with PCP for HTN and HLD management  No further concerns at this time     History provided for reference purposes only Update 03/11/2019 JM: [name] is a 54 year old male who is being seen today for stroke follow-up accompanied by his son. Recent concerns of increased fatigue, falls, short term memory loss and decreased motivation. After prior visit, order placed to participate in PT/OT/ST but per epic review, when neuro rehab called ex wife to reschedule evaluations, therapy was declined as she was pursuing facility placement.  Recent falls appear to be mechanical falls without injury.  Continues to use walker for ambulation.  He does  endorse ongoing/increased depression and anxiety and has had multiple conversations regarding this with his PCP.  At prior visits, he was provided with counseling/therapy office number to schedule evaluation but has not done so at this time.  He does admit to sedentary lifestyle without routine exercise and lack of activity which was discussed at length at multiple prior visits.  Currently on sertraline 100 mg daily for depression and trazodone due to insomnia.  Concerns regarding memory loss has also been discussed at prior visits and patient denies any worsening but has not improved.  MMSE today 21/30.  Son also endorses stiffness of middle finger bilaterally which he was evaluated by PCP and has current orders for x-ray but has not been done at this time. Son questions if an MRI is needed due to finger stiffness and symptoms as noted above. Also observed hoarse non productive cough frequently during visit and when questioned, son stated it was a chronic "smokers cough" and has had it for years. He continues on plavix, aspirin and atorvastatin for secondary stroke prevention without side effects.  Blood pressure today 136/80.  No further concerns at this time.  Update 10/07/2018 JM: [name] is a 54 year old male who is being seen today for stroke follow-up accompanied by his son.  Residual deficits of left hemiparesis, mild dysarthria, gait difficulties and decreased cognition.  Patient is frustrated regarding lack of improvement as he is now nonambulatory.  Referral for home health placed at prior visit which he was agreeable to but apparently refused when they called to schedule visits.  He spends his day in a wheelchair or  on the couch without any activity and decreased function is likely secondary to deconditioning.  Discussion regarding need of regular exercise and therapy in order to see improvement as he will likely continue to see worsening with current activity level.  He continues to live with his son  and daughter along with his ex-wife.  Son does provide history regarding ongoing medication use as patient unable to verify.  He also states he does not remember declining home health therapy but after discussion with replacing referral for home health versus outpatient therapy, he declines interest initially.  He has continued on aspirin and Plavix without bleeding or bruising.  Continues on atorvastatin without myalgias.  Blood pressure stable at 120/68.  Zoloft dosage increased at prior visit due to depression and anxiety without ongoing concerns.  No further concerns at this time.  Virtual update 06/26/2018 JM: During today's visit, he continued to have gait difficulties and cognitive deficits.  He currently lives with his wife (currently separated) and adult children.  Prior to his stroke, he was living independently and working full-time but moved back in after his stroke as he needed assistance.  Wife endorses frustration with his lack of motivation to improve and states that he will only ambulate to the bathroom but then will sit back on the couch where he spends most of the day.  He was previously receiving home therapy but due to his lack of participation, he was discharged from services.  Over the past few months, he has become more incontinent of urine and occasionally stool.  Patient states he was previously having difficulty getting to the bathroom in time and would end up urinating prior to getting to the bathroom and therefore they started placing him in briefs.  Since he has been in briefs, he has become more incontinent and wife is unsure if this is due to "laziness".  He is aware of when he needs to void and is unable to state whether incontinence is due to "laziness" or urgency sensation.  He will occasionally be incontinent of stool but wife endorses constipation.  He uses a wheelchair for long distance.  He denies any falls with rolling walker.  He states his memory has been stable in regards  to his short-term memory which wife agrees with but also believes he has long-term memory issues such as not remembering important events from prior years.  His cognitive deficit has been stable without any worsening but denies improvement.  He continues on aspirin and Plavix and atorvastatin 10 mg daily without reported side effects.  He denies new or worsening stroke/TIA symptoms. Further evaluation regarding cognition and possible underlying depression which could be relating to his lack of motivation and willingness to care for himself, incontinence and motivation.  He does sleep well throughout the night and will only nap occasionally.  Denies snoring.  He does undergo HD 3 times per week.  PHQ 9 and GAD 7 listed below but question accuracy due to variation in answers.  Per wife, questionable underlying depression prior to his stroke after he and his wife became separated.  She has been told by his daughter that he was "drinking a lot and stayed in a dark room".  While wife was initially speaking about her frustrations and is even currently looking into a long-term care facility due to his consistent daily care needs, he was laughing and smirking.  When she started speaking of their separation and potential underlying depression, he became tearful and very upset.  Her wife, he does not have family outside of herself and his children therefore his care relies mainly on them.  His wife feels as though he has just "given up hope" and has no desire to become independent with his daily functioning.  Attempted to question further but unable to adequately assess.   Initial visit 10/15/2017 : Jakyron Fabro is a 54 y.o. with PMH of COPD, CAD s/p stents, ESRD on HD, HTN, depression, chronic combined systolic and diastolic heart failure, prior tobacco use, HLD and DM.  He complained of slurred speech, gait and balance difficulties and underwent CT head on 07/04/2017 which showed small right pontine and bilateral basal  ganglia lacunar infarcts with mild degree of generalized cerebral atrophy.  He was initially evaluated in our office by Dr Leonie Man on 10/15/2017 with residual gait difficulty with use of rolling walker.  No further work-up has been done prior to initial office visit.  Recommended to complete stroke work-up with MRI brain on 11/20/2017 which showed remote H bilateral basal ganglia, thalamic and brainstem infarcts.  MRA of neck unremarkable.  MRA head showed diffuse intracranial arthrosclerotic changes in anterior circulation.  2D echo unremarkable.  Cardiac event monitoring negative for A. Fib.  At follow-up visit on 12/26/2017 he continues to have residual gait difficulties and new complaints of memory difficulties which she has been experiencing since her stroke.    ROS:   14 system review of systems is positive for those reported in HPI and all other systems negative  PMH:  Past Medical History:  Diagnosis Date  . Chest pain 01/10/2016  . Chronic combined systolic and diastolic heart failure ([place])   . Coronary artery disease    a.12/22/15: NSTEMI s/p overlapping DES x2 and balloon angioplasty to distal AV groove Circumflex (too small for a stent).   . Coronary artery disease due to lipid rich plaque 01/10/2016  . Dehydration 08/22/2012  . Depression   . Elevated troponin   . ESRD (end stage renal disease) on dialysis ([place])   . Essential hypertension   . History of completed stroke 05/28/2017  . Hypercholesteremia   . Hypertension   . Hypertensive heart disease with heart failure ([place])   . Ischemic cardiomyopathy 03/13/2016  . Normocytic anemia 05/28/2017  . NSTEMI (non-ST elevated myocardial infarction) ([place]) 12/22/2015  . Prostate infection   . Status post coronary artery stent placement   . Syncope 03/13/2016  . Tobacco abuse   . Type 2 diabetes mellitus with diabetic nephropathy, without long-term current use of insulin ([place])   . Type II diabetes mellitus ([place])   . Uncontrolled hypertension  05/28/2017  . Unstable angina pectoris ([place]) 01/11/2016    Social History:  Social History   Socioeconomic History  . Marital status: Married    Spouse name: Not on file  . Number of children: Not on file  . Years of education: Not on file  . Highest education level: Not on file  Occupational History  . Occupation: International aid/development worker: [employer]  Tobacco Use  . Smoking status: Former Smoker    Packs/day: 0.75    Years: 29.00    Pack years: 21.75    Types: Cigarettes    Quit date: 05/28/2017    Years since quitting: 2.3  . Smokeless tobacco: Never Used  Vaping Use  . Vaping Use: Never used  Substance and Sexual Activity  . Alcohol use: Not Currently    Alcohol/week: 7.0 standard drinks  Types: 7 Cans of beer per week    Comment: last drink was prior to last hospitalization  . Drug use: No  . Sexual activity: Not Currently  Other Topics Concern  . Not on file  Social History Narrative  . Not on file   Social Determinants of Health   Financial Resource Strain:   . Difficulty of Paying Living Expenses: Not on file  Food Insecurity:   . Worried About Charity fundraiser in the Last Year: Not on file  . Ran Out of Food in the Last Year: Not on file  Transportation Needs:   . Lack of Transportation (Medical): Not on file  . Lack of Transportation (Non-Medical): Not on file  Physical Activity:   . Days of Exercise per Week: Not on file  . Minutes of Exercise per Session: Not on file  Stress:   . Feeling of Stress : Not on file  Social Connections:   . Frequency of Communication with Friends and Family: Not on file  . Frequency of Social Gatherings with Friends and Family: Not on file  . Attends Religious Services: Not on file  . Active Member of Clubs or Organizations: Not on file  . Attends Archivist Meetings: Not on file  . Marital Status: Not on file  Intimate Partner Violence:   . Fear of Current or Ex-Partner: Not on file  . Emotionally  Abused: Not on file  . Physically Abused: Not on file  . Sexually Abused: Not on file    Medications:   Current Outpatient Medications on File Prior to Visit  Medication Sig Dispense Refill  . aspirin 81 MG chewable tablet CHEW ONE TABLET BY MOUTH DAILY 90 tablet 3  . atorvastatin (LIPITOR) 10 MG tablet Take 1 tablet (10 mg total) by mouth daily. Please call to schedule appointment for future refills. 90 tablet 0  . B Complex-C-Folic Acid (RENA-VITE RX) 1 MG TABS TAKE ONE TABLET BY MOUTH AT BEDTIME 90 tablet 1  . calcitRIOL (ROCALTROL) 0.5 MCG capsule TAKE 1 CAPSULE BY MOUTH ON MONDAY, WEDNESDAY, AND FRIDAY WITH HEMODIALYSIS 30 capsule 0  . carvedilol (COREG) 6.25 MG tablet TAKE ONE TABLET BY MOUTH TWICE A DAY WITH A MEAL 180 tablet 3  . fluticasone (FLONASE) 50 MCG/ACT nasal spray Place 1-2 sprays into both nostrils daily. 16 g 3  . Fluticasone-Salmeterol (ADVAIR DISKUS IN) Inhale 2 puffs into the lungs 2 (two) times daily.    Marland Kitchen glucose blood (ONETOUCH VERIO) test strip 1 each by Other route as needed for other. Use as instructed 1-2 times per day. 100 each 2  . guaiFENesin (MUCINEX) 600 MG 12 hr tablet Take by mouth 2 (two) times daily as needed.    . Ipratropium-Albuterol (COMBIVENT RESPIMAT IN) Inhale 1-2 puffs into the lungs every 6 (six) hours as needed.    . Lancets (ONETOUCH DELICA PLUS [id number]) MISC USE ONELANCET TO TEST - FOUR TIMES A DAY 100 each 1  . nitroGLYCERIN (NITROSTAT) 0.4 MG SL tablet Place 1 tablet (0.4 mg total) under the tongue every 5 (five) minutes x 3 doses as needed for chest pain. 25 tablet 3  . sertraline (ZOLOFT) 100 MG tablet TAKE ONE TABLET BY MOUTH DAILY 90 tablet 1  . sevelamer carbonate (RENVELA) 800 MG tablet Take 1 tablet (800 mg total) by mouth 3 (three) times daily with meals. 90 tablet 1  . STOOL SOFTENER 100 MG capsule TAKE ONE CAPSULE BY MOUTH TWICE A DAY 90 capsule 3  .  traZODone (DESYREL) 50 MG tablet TAKE ONE TABLET BY MOUTH EVERY NIGHT AT BEDTIME  90 tablet 0   No current facility-administered medications on file prior to visit.    Allergies:  No Known Allergies  Today's Vitals   10/13/19 1246  BP: 118/71  Pulse: 71  Weight: 127 lb 9.6 oz (57.9 kg)  Height: 5\' 5"  (1.651 m)   Body mass index is 21.23 kg/m.  Physical Exam  General: well developed, well nourished,  pleasant middle-aged male, seated, in no evident distress Head: head normocephalic and atraumatic.   Neck: supple with no carotid or supraclavicular bruits Cardiovascular: regular rate and rhythm, no murmurs Musculoskeletal: no deformity Skin:  no rash/petichiae Vascular:  Normal pulses all extremities   Neurologic Exam Mental Status: Awake and fully alert. Mild dysarthria.  No evidence of aphasia.  Oriented to place and time. Recent and remote memory diminished. Attention span, concentration and fund of knowledge mildly diminished but able to answer questions appropriately. Mood and affect appropriate.  Cranial Nerves: Pupils equal, briskly reactive to light. Extraocular movements full without nystagmus. Visual fields full to confrontation. Hearing intact. Facial sensation intact.  Mild Left-sided facial weakness Motor: 4/5 left spastic hemiparesis.  Full strength right upper and lower extremity Sensory.: intact to touch , pinprick , position and vibratory sensation.  Coordination: Rapid alternating diminished on left side. Finger-to-nose performed accurately bilaterally and heel-to-shin performed accurately on right side but unable to perform on left side Gait and Station: Deferred as patient nonambulatory due to increased fall risk Reflexes: 1+ right side and 2+ left side. Toes downgoing.       ASSESSMENT/PLAN: 54 year old male with slurred speech, gait difficulty, mild cognitive impairment and left-sided weakness from lacunar brainstem and  Bilateral subcortical infarcts likely from small vessel disease in May 2019. Vascular risk factors of hypertension,  diabetes, hyperlipidemia , coronary artery disease,and history of smoking.       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  He has since been transferred to SNF but thankfully now participating in PT/OT/SLP.  Discussed importance of continued participation mainly for strengthening but low suspicion for further stroke recovery as his stroke occurred over 2 years ago -Continue aspirin and plavix for secondary stroke prevention and h/o cardiac stents  -Continue atorvastatin 10 mg daily and ongoing follow-up with PCP -maintain strict control of hypertension with blood pressure goal below 130/90 and lipids with LDL cholesterol goal below 70 mg/dL. I also advised the patient to eat a healthy diet with plenty of whole grains, cereals, fruits and vegetables, exercise regularly and maintain ideal body weight   Adjustment disorder Post stroke depression/anxiety -Previously stable on sertraline 100 mg daily -Reports worsening since transfer to SNF.  Discussed monitoring for now due to hopeful improvement as he becomes more adjusted and routinely participating in therapy -Deferred further monitoring and management to facility -Multiple prior discussions by myself and with PCP regarding participation in psychotherapy but patient continued to decline   Overall stable from stroke standpoint currently being followed closely by facility therefore recommend follow-up on an as-needed basis but advised to call with any questions or concerns regarding his prior stroke   I spent 30 minutes of face-to-face and non-face-to-face time with patient and son.  This included previsit chart review, lab review, study review, order entry, electronic health  record documentation, patient  education/discussion regarding prior strokes with residual deficits, recent transfer to SNF and importance of therapy participation, importance of managing stroke risk factors, adjustment disorder with poststroke depression/anxiety and answered all their questions to patient and son satisfaction     Frann Rider, AGNP-BC  [facility] [address] [place], [place] [address]  Phone [phone] Fax [phone] Note: This document was prepared with digital dictation and possible smart phrase technology. Any transcriptional errors that result from this process are unintentional.

## 2019-10-14 NOTE — Progress Notes (Signed)
I agree with the above plan 

## 2019-11-07 ENCOUNTER — Other Ambulatory Visit: Payer: Self-pay | Admitting: Family Medicine

## 2019-11-07 DIAGNOSIS — F32A Depression, unspecified: Secondary | ICD-10-CM

## 2019-11-07 NOTE — Telephone Encounter (Signed)
Requested Prescriptions  Pending Prescriptions Disp Refills  . traZODone (DESYREL) 50 MG tablet [Pharmacy Med Name: traZODone 50 MG TABLET] 90 tablet 0    Sig: TAKE ONE TABLET BY MOUTH EVERY NIGHT AT BEDTIME     Psychiatry: Antidepressants - Serotonin Modulator Passed - 11/07/2019  7:39 AM      Passed - Completed PHQ-2 or PHQ-9 in the last 360 days.      Passed - Valid encounter within last 6 months    Recent Outpatient Visits          5 months ago Cough   [facility], Ranell Patrick, MD   6 months ago Depression, unspecified depression type   [facility], Ranell Patrick, MD   7 months ago Finger pain, left   [facility], Ranell Patrick, MD   8 months ago Stiffness of finger joint, unspecified laterality   [facility], Ranell Patrick, MD   11 months ago Insomnia, unspecified type   [facility], Ranell Patrick, MD

## 2019-11-19 ENCOUNTER — Other Ambulatory Visit: Payer: Self-pay | Admitting: Family Medicine

## 2019-11-19 DIAGNOSIS — Z992 Dependence on renal dialysis: Secondary | ICD-10-CM

## 2019-11-19 DIAGNOSIS — N186 End stage renal disease: Secondary | ICD-10-CM

## 2019-11-19 NOTE — Telephone Encounter (Signed)
Requested medication (s) are due for refill today: yes  Requested medication (s) are on the active medication list: yes  Last refill:  08/10/19  Future visit scheduled: no  Notes to clinic:  no assigned protocol    Requested Prescriptions  Pending Prescriptions Disp Refills   B Complex-C-Folic Acid (RENA-VITE RX) 1 MG TABS [Pharmacy Med Name: RENA-VITE RX TABLET] 90 tablet 1    Sig: TAKE ONE TABLET BY MOUTH AT BEDTIME      Off-Protocol Failed - 11/19/2019  6:14 AM      Failed - Medication not assigned to a protocol, review manually.      Passed - Valid encounter within last 12 months    Recent Outpatient Visits           6 months ago Cough   [facility], Ranell Patrick, MD   7 months ago Depression, unspecified depression type   [facility], Ranell Patrick, MD   8 months ago Finger pain, left   [facility], Ranell Patrick, MD   8 months ago Stiffness of finger joint, unspecified laterality   [facility], Ranell Patrick, MD   11 months ago Insomnia, unspecified type   [facility], Ranell Patrick, MD

## 2019-11-30 ENCOUNTER — Encounter (HOSPITAL_COMMUNITY): Payer: Self-pay | Admitting: Pharmacy Technician

## 2019-11-30 ENCOUNTER — Inpatient Hospital Stay (HOSPITAL_COMMUNITY)
Admission: EM | Admit: 2019-11-30 | Discharge: 2019-12-04 | DRG: 166 | Disposition: A | Discharge status: Skilled Nursing Facility | Payer: BC Managed Care – PPO | Source: Skilled Nursing Facility | Attending: Family Medicine | Admitting: Family Medicine

## 2019-11-30 ENCOUNTER — Emergency Department (HOSPITAL_COMMUNITY): Payer: BC Managed Care – PPO

## 2019-11-30 DIAGNOSIS — Z79899 Other long term (current) drug therapy: Secondary | ICD-10-CM

## 2019-11-30 DIAGNOSIS — T82868A Thrombosis of vascular prosthetic devices, implants and grafts, initial encounter: Secondary | ICD-10-CM | POA: Diagnosis not present

## 2019-11-30 DIAGNOSIS — N186 End stage renal disease: Secondary | ICD-10-CM

## 2019-11-30 DIAGNOSIS — J9601 Acute respiratory failure with hypoxia: Secondary | ICD-10-CM | POA: Diagnosis present

## 2019-11-30 DIAGNOSIS — Z20822 Contact with and (suspected) exposure to covid-19: Secondary | ICD-10-CM | POA: Diagnosis present

## 2019-11-30 DIAGNOSIS — I255 Ischemic cardiomyopathy: Secondary | ICD-10-CM | POA: Diagnosis present

## 2019-11-30 DIAGNOSIS — J9612 Chronic respiratory failure with hypercapnia: Secondary | ICD-10-CM | POA: Diagnosis present

## 2019-11-30 DIAGNOSIS — Z992 Dependence on renal dialysis: Secondary | ICD-10-CM | POA: Diagnosis not present

## 2019-11-30 DIAGNOSIS — Z823 Family history of stroke: Secondary | ICD-10-CM

## 2019-11-30 DIAGNOSIS — Z8673 Personal history of transient ischemic attack (TIA), and cerebral infarction without residual deficits: Secondary | ICD-10-CM

## 2019-11-30 DIAGNOSIS — N2581 Secondary hyperparathyroidism of renal origin: Secondary | ICD-10-CM | POA: Diagnosis present

## 2019-11-30 DIAGNOSIS — K5900 Constipation, unspecified: Secondary | ICD-10-CM | POA: Diagnosis present

## 2019-11-30 DIAGNOSIS — I132 Hypertensive heart and chronic kidney disease with heart failure and with stage 5 chronic kidney disease, or end stage renal disease: Secondary | ICD-10-CM | POA: Diagnosis present

## 2019-11-30 DIAGNOSIS — E1169 Type 2 diabetes mellitus with other specified complication: Secondary | ICD-10-CM | POA: Diagnosis present

## 2019-11-30 DIAGNOSIS — F05 Delirium due to known physiological condition: Secondary | ICD-10-CM | POA: Diagnosis present

## 2019-11-30 DIAGNOSIS — R1310 Dysphagia, unspecified: Secondary | ICD-10-CM | POA: Diagnosis present

## 2019-11-30 DIAGNOSIS — E1121 Type 2 diabetes mellitus with diabetic nephropathy: Secondary | ICD-10-CM | POA: Diagnosis present

## 2019-11-30 DIAGNOSIS — I10 Essential (primary) hypertension: Secondary | ICD-10-CM | POA: Diagnosis present

## 2019-11-30 DIAGNOSIS — D631 Anemia in chronic kidney disease: Secondary | ICD-10-CM | POA: Diagnosis present

## 2019-11-30 DIAGNOSIS — E871 Hypo-osmolality and hyponatremia: Secondary | ICD-10-CM | POA: Diagnosis present

## 2019-11-30 DIAGNOSIS — J690 Pneumonitis due to inhalation of food and vomit: Principal | ICD-10-CM | POA: Diagnosis present

## 2019-11-30 DIAGNOSIS — J449 Chronic obstructive pulmonary disease, unspecified: Secondary | ICD-10-CM | POA: Diagnosis not present

## 2019-11-30 DIAGNOSIS — Z792 Long term (current) use of antibiotics: Secondary | ICD-10-CM

## 2019-11-30 DIAGNOSIS — Y832 Surgical operation with anastomosis, bypass or graft as the cause of abnormal reaction of the patient, or of later complication, without mention of misadventure at the time of the procedure: Secondary | ICD-10-CM | POA: Diagnosis not present

## 2019-11-30 DIAGNOSIS — T380X5A Adverse effect of glucocorticoids and synthetic analogues, initial encounter: Secondary | ICD-10-CM | POA: Diagnosis present

## 2019-11-30 DIAGNOSIS — Z7982 Long term (current) use of aspirin: Secondary | ICD-10-CM

## 2019-11-30 DIAGNOSIS — E1165 Type 2 diabetes mellitus with hyperglycemia: Secondary | ICD-10-CM | POA: Diagnosis present

## 2019-11-30 DIAGNOSIS — Z955 Presence of coronary angioplasty implant and graft: Secondary | ICD-10-CM

## 2019-11-30 DIAGNOSIS — Z79891 Long term (current) use of opiate analgesic: Secondary | ICD-10-CM

## 2019-11-30 DIAGNOSIS — E785 Hyperlipidemia, unspecified: Secondary | ICD-10-CM | POA: Diagnosis present

## 2019-11-30 DIAGNOSIS — J189 Pneumonia, unspecified organism: Secondary | ICD-10-CM | POA: Diagnosis present

## 2019-11-30 DIAGNOSIS — Z841 Family history of disorders of kidney and ureter: Secondary | ICD-10-CM

## 2019-11-30 DIAGNOSIS — E889 Metabolic disorder, unspecified: Secondary | ICD-10-CM | POA: Diagnosis present

## 2019-11-30 DIAGNOSIS — I69354 Hemiplegia and hemiparesis following cerebral infarction affecting left non-dominant side: Secondary | ICD-10-CM

## 2019-11-30 DIAGNOSIS — I252 Old myocardial infarction: Secondary | ICD-10-CM

## 2019-11-30 DIAGNOSIS — E1122 Type 2 diabetes mellitus with diabetic chronic kidney disease: Secondary | ICD-10-CM | POA: Diagnosis present

## 2019-11-30 DIAGNOSIS — Z833 Family history of diabetes mellitus: Secondary | ICD-10-CM

## 2019-11-30 DIAGNOSIS — R778 Other specified abnormalities of plasma proteins: Secondary | ICD-10-CM

## 2019-11-30 DIAGNOSIS — I2510 Atherosclerotic heart disease of native coronary artery without angina pectoris: Secondary | ICD-10-CM | POA: Diagnosis present

## 2019-11-30 DIAGNOSIS — I5042 Chronic combined systolic (congestive) and diastolic (congestive) heart failure: Secondary | ICD-10-CM | POA: Diagnosis present

## 2019-11-30 DIAGNOSIS — Z7902 Long term (current) use of antithrombotics/antiplatelets: Secondary | ICD-10-CM

## 2019-11-30 DIAGNOSIS — Z789 Other specified health status: Secondary | ICD-10-CM

## 2019-11-30 DIAGNOSIS — M898X9 Other specified disorders of bone, unspecified site: Secondary | ICD-10-CM | POA: Diagnosis present

## 2019-11-30 DIAGNOSIS — N189 Chronic kidney disease, unspecified: Secondary | ICD-10-CM | POA: Diagnosis not present

## 2019-11-30 DIAGNOSIS — Z7951 Long term (current) use of inhaled steroids: Secondary | ICD-10-CM

## 2019-11-30 DIAGNOSIS — T82511A Breakdown (mechanical) of surgically created arteriovenous shunt, initial encounter: Secondary | ICD-10-CM | POA: Diagnosis not present

## 2019-11-30 DIAGNOSIS — E8889 Other specified metabolic disorders: Secondary | ICD-10-CM | POA: Diagnosis present

## 2019-11-30 DIAGNOSIS — Z87891 Personal history of nicotine dependence: Secondary | ICD-10-CM

## 2019-11-30 DIAGNOSIS — Z8249 Family history of ischemic heart disease and other diseases of the circulatory system: Secondary | ICD-10-CM

## 2019-11-30 DIAGNOSIS — F32A Depression, unspecified: Secondary | ICD-10-CM | POA: Diagnosis present

## 2019-11-30 DIAGNOSIS — Z7952 Long term (current) use of systemic steroids: Secondary | ICD-10-CM

## 2019-11-30 DIAGNOSIS — F0390 Unspecified dementia without behavioral disturbance: Secondary | ICD-10-CM | POA: Diagnosis present

## 2019-11-30 DIAGNOSIS — Z593 Problems related to living in residential institution: Secondary | ICD-10-CM

## 2019-11-30 LAB — CBC
HCT: 36.1 % — ABNORMAL LOW (ref 39.0–52.0)
Hemoglobin: 11.4 g/dL — ABNORMAL LOW (ref 13.0–17.0)
MCH: 31.2 pg (ref 26.0–34.0)
MCHC: 31.6 g/dL (ref 30.0–36.0)
MCV: 98.9 fL (ref 80.0–100.0)
Platelets: 396 10*3/uL (ref 150–400)
RBC: 3.65 MIL/uL — ABNORMAL LOW (ref 4.22–5.81)
RDW: 13.3 % (ref 11.5–15.5)
WBC: 23.7 10*3/uL — ABNORMAL HIGH (ref 4.0–10.5)
nRBC: 0 % (ref 0.0–0.2)

## 2019-11-30 LAB — I-STAT VENOUS BLOOD GAS, ED
Acid-Base Excess: 9 mmol/L — ABNORMAL HIGH (ref 0.0–2.0)
Bicarbonate: 33.5 mmol/L — ABNORMAL HIGH (ref 20.0–28.0)
Calcium, Ion: 1.01 mmol/L — ABNORMAL LOW (ref 1.15–1.40)
HCT: 37 % — ABNORMAL LOW (ref 39.0–52.0)
Hemoglobin: 12.6 g/dL — ABNORMAL LOW (ref 13.0–17.0)
O2 Saturation: 98 %
Potassium: 4.4 mmol/L (ref 3.5–5.1)
Sodium: 125 mmol/L — ABNORMAL LOW (ref 135–145)
TCO2: 35 mmol/L — ABNORMAL HIGH (ref 22–32)
pCO2, Ven: 44.2 mmHg (ref 44.0–60.0)
pH, Ven: 7.487 — ABNORMAL HIGH (ref 7.250–7.430)
pO2, Ven: 93 mmHg — ABNORMAL HIGH (ref 32.0–45.0)

## 2019-11-30 LAB — COMPREHENSIVE METABOLIC PANEL
ALT: 21 U/L (ref 0–44)
AST: 20 U/L (ref 15–41)
Albumin: 2.9 g/dL — ABNORMAL LOW (ref 3.5–5.0)
Alkaline Phosphatase: 81 U/L (ref 38–126)
Anion gap: 19 — ABNORMAL HIGH (ref 5–15)
BUN: 86 mg/dL — ABNORMAL HIGH (ref 6–20)
CO2: 28 mmol/L (ref 22–32)
Calcium: 9.5 mg/dL (ref 8.9–10.3)
Chloride: 81 mmol/L — ABNORMAL LOW (ref 98–111)
Creatinine, Ser: 8.16 mg/dL — ABNORMAL HIGH (ref 0.61–1.24)
GFR, Estimated: 7 mL/min — ABNORMAL LOW (ref >60)
Glucose, Bld: 330 mg/dL — ABNORMAL HIGH (ref 70–99)
Potassium: 4.5 mmol/L (ref 3.5–5.1)
Sodium: 128 mmol/L — ABNORMAL LOW (ref 135–145)
Total Bilirubin: 1.3 mg/dL — ABNORMAL HIGH (ref 0.3–1.2)
Total Protein: 7.9 g/dL (ref 6.5–8.1)

## 2019-11-30 LAB — CBG MONITORING, ED: Glucose-Capillary: 347 mg/dL — ABNORMAL HIGH (ref 70–99)

## 2019-11-30 LAB — LACTIC ACID, PLASMA
Lactic Acid, Venous: 1.4 mmol/L (ref 0.5–1.9)
Lactic Acid, Venous: 1.2 mmol/L (ref 0.5–1.9)

## 2019-11-30 LAB — TROPONIN I (HIGH SENSITIVITY)
Troponin I (High Sensitivity): 174 ng/L (ref <18)
Troponin I (High Sensitivity): 195 ng/L (ref <18)

## 2019-11-30 LAB — RESPIRATORY PANEL BY RT PCR (FLU A&B, COVID)
Influenza A by PCR: NEGATIVE
Influenza B by PCR: NEGATIVE
SARS Coronavirus 2 by RT PCR: NEGATIVE

## 2019-11-30 LAB — HIV ANTIBODY (ROUTINE TESTING W REFLEX): HIV Screen 4th Generation wRfx: NONREACTIVE

## 2019-11-30 MED ORDER — ACETAMINOPHEN 650 MG RE SUPP: 650.0000 mg | Freq: Four times a day (QID) | RECTAL | Status: DC | PRN

## 2019-11-30 MED ORDER — VANCOMYCIN HCL 1250 MG/250ML IV SOLN
1250.0000 mg | Freq: Once | INTRAVENOUS | Status: AC
  Administered 2019-11-30: 1250 mg via INTRAVENOUS
  Filled 2019-11-30: qty 250

## 2019-11-30 MED ORDER — VANCOMYCIN VARIABLE DOSE PER UNSTABLE RENAL FUNCTION (PHARMACIST DOSING): Status: DC

## 2019-11-30 MED ORDER — RENA-VITE PO TABS
1.0000 | ORAL_TABLET | Freq: Every day | ORAL | Status: DC
  Filled 2019-11-30: qty 1

## 2019-11-30 MED ORDER — HEPARIN SODIUM (PORCINE) 5000 UNIT/ML IJ SOLN
5000.0000 [IU] | Freq: Three times a day (TID) | INTRAMUSCULAR | Status: DC
  Administered 2019-11-30 – 2019-12-02 (×7): 5000 [IU] via SUBCUTANEOUS
  Filled 2019-11-30 (×7): qty 1

## 2019-11-30 MED ORDER — VANCOMYCIN HCL 1250 MG/250ML IV SOLN: 1250.0000 mg | Freq: Once | INTRAVENOUS | Status: DC

## 2019-11-30 MED ORDER — ASPIRIN 81 MG PO CHEW
81.0000 mg | CHEWABLE_TABLET | Freq: Every day | ORAL | Status: DC
  Administered 2019-11-30 – 2019-12-04 (×4): 81 mg via ORAL
  Filled 2019-11-30 (×4): qty 1

## 2019-11-30 MED ORDER — CARVEDILOL 6.25 MG PO TABS
6.2500 mg | ORAL_TABLET | Freq: Two times a day (BID) | ORAL | Status: DC
  Administered 2019-11-30: 6.25 mg via ORAL
  Filled 2019-11-30: qty 2

## 2019-11-30 MED ORDER — ATORVASTATIN CALCIUM 10 MG PO TABS
10.0000 mg | ORAL_TABLET | Freq: Every day | ORAL | Status: DC
  Administered 2019-12-02 – 2019-12-04 (×3): 10 mg via ORAL
  Filled 2019-11-30 (×4): qty 1

## 2019-11-30 MED ORDER — MOMETASONE FURO-FORMOTEROL FUM 200-5 MCG/ACT IN AERO
2.0000 | INHALATION_SPRAY | Freq: Two times a day (BID) | RESPIRATORY_TRACT | Status: DC
  Administered 2019-12-02 – 2019-12-04 (×6): 2 via RESPIRATORY_TRACT
  Filled 2019-11-30 (×4): qty 8.8

## 2019-11-30 MED ORDER — SODIUM CHLORIDE 0.9% FLUSH
3.0000 mL | Freq: Two times a day (BID) | INTRAVENOUS | Status: DC
  Administered 2019-11-30 – 2019-12-04 (×9): 3 mL via INTRAVENOUS

## 2019-11-30 MED ORDER — INSULIN ASPART 100 UNIT/ML ~~LOC~~ SOLN
0.0000 [IU] | Freq: Three times a day (TID) | SUBCUTANEOUS | Status: DC
  Administered 2019-11-30: 7 [IU] via SUBCUTANEOUS
  Administered 2019-12-01 (×2): 2 [IU] via SUBCUTANEOUS
  Administered 2019-12-02: 9 [IU] via SUBCUTANEOUS
  Administered 2019-12-02: 5 [IU] via SUBCUTANEOUS

## 2019-11-30 MED ORDER — METHYLPREDNISOLONE SODIUM SUCC 125 MG IJ SOLR
125.0000 mg | Freq: Four times a day (QID) | INTRAMUSCULAR | Status: DC
  Administered 2019-12-01 (×2): 125 mg via INTRAVENOUS
  Filled 2019-11-30 (×2): qty 2

## 2019-11-30 MED ORDER — SODIUM CHLORIDE 0.9 % IV SOLN
1.0000 g | INTRAVENOUS | Status: DC
  Filled 2019-11-30: qty 1

## 2019-11-30 MED ORDER — CHLORHEXIDINE GLUCONATE CLOTH 2 % EX PADS
6.0000 | MEDICATED_PAD | Freq: Every day | CUTANEOUS | Status: DC
  Administered 2019-12-01 – 2019-12-04 (×4): 6 via TOPICAL

## 2019-11-30 MED ORDER — ENOXAPARIN SODIUM 30 MG/0.3ML ~~LOC~~ SOLN: 30.0000 mg | SUBCUTANEOUS | Status: DC

## 2019-11-30 MED ORDER — METOCLOPRAMIDE HCL 5 MG/ML IJ SOLN
5.0000 mg | INTRAMUSCULAR | Status: AC
  Administered 2019-11-30: 5 mg via INTRAVENOUS
  Filled 2019-11-30: qty 2

## 2019-11-30 MED ORDER — SODIUM CHLORIDE 0.9 % IV SOLN: 2.0000 g | Freq: Once | INTRAVENOUS | Status: DC

## 2019-11-30 MED ORDER — CALCITRIOL 0.5 MCG PO CAPS: 0.5000 ug | ORAL_CAPSULE | ORAL | Status: DC

## 2019-11-30 MED ORDER — CALCITRIOL 0.25 MCG PO CAPS
0.5000 ug | ORAL_CAPSULE | ORAL | Status: DC
  Filled 2019-11-30: qty 1

## 2019-11-30 MED ORDER — ACETAMINOPHEN 325 MG PO TABS: 650.0000 mg | ORAL_TABLET | Freq: Four times a day (QID) | ORAL | Status: DC | PRN

## 2019-11-30 MED ORDER — THIAMINE HCL 100 MG PO TABS
100.0000 mg | ORAL_TABLET | Freq: Every day | ORAL | Status: DC
  Filled 2019-11-30: qty 1

## 2019-11-30 MED ORDER — METOCLOPRAMIDE HCL 5 MG/ML IJ SOLN: 5.0000 mg | Freq: Four times a day (QID) | INTRAMUSCULAR | Status: DC | PRN

## 2019-11-30 MED ORDER — POLYETHYLENE GLYCOL 3350 17 G PO PACK: 17.0000 g | PACK | Freq: Every day | ORAL | Status: DC | PRN

## 2019-11-30 MED ORDER — SODIUM CHLORIDE 0.9 % IV SOLN
500.0000 mg | INTRAVENOUS | Status: DC
  Administered 2019-11-30: 500 mg via INTRAVENOUS
  Filled 2019-11-30: qty 500

## 2019-11-30 MED ORDER — SODIUM CHLORIDE 0.9 % IV SOLN
2.0000 g | Freq: Once | INTRAVENOUS | Status: AC
  Administered 2019-11-30: 2 g via INTRAVENOUS
  Filled 2019-11-30: qty 2

## 2019-11-30 MED ORDER — SERTRALINE HCL 100 MG PO TABS
100.0000 mg | ORAL_TABLET | Freq: Every day | ORAL | Status: DC
  Administered 2019-12-02 – 2019-12-04 (×3): 100 mg via ORAL
  Filled 2019-11-30 (×5): qty 1

## 2019-11-30 MED ORDER — UMECLIDINIUM BROMIDE 62.5 MCG/INH IN AEPB
1.0000 | INHALATION_SPRAY | Freq: Every day | RESPIRATORY_TRACT | Status: DC
  Filled 2019-11-30: qty 7

## 2019-11-30 MED ORDER — FLUTICASONE-SALMETEROL 250-50 MCG/DOSE IN AEPB
1.0000 | INHALATION_SPRAY | Freq: Two times a day (BID) | RESPIRATORY_TRACT | Status: DC
  Filled 2019-11-30: qty 14

## 2019-11-30 NOTE — Consult Note (Addendum)
[facility] Renal Consultation Note    Indication for Consultation:  Management of ESRD/hemodialysis, anemia, hypertension/volume, and secondary hyperparathyroidism. PCP:  HPI: Jarman Litton is a 54 y.o. male with ESRD, T2DM, HTN, CAD (hx stents 2017), Hx CVA who is being admitted with pneumonia.  Has been living in SNF x 2 months d/t mild dementia. Per notes, they were getting him ready for dialysis this morning when EMS was called d/t worsened dyspnea. Had been treated for pneumonia recently and has been coughing x 2 days. EMS found O2 sat 55% - he was started on high flow nasal cannula with improved saturations.  Intake vitals shwoed hypertension, tachycardia, and tachypnea. He was afebrile. Labs showed Na 125 -> 128, K 4.5, Glu 330, Alb 2.9, Trop 174 -> 195, LA 1.4 -> 1.2, WBC 23.7, Hgb 11.4 -> 12.6. Flu/COVID were negative. CXR with RLL pneumonia. EKG with sinus tachycardia. BCx drawn and he was started on Vanc/Cefepime.  He looks ill when seen in ED bed. He is dyspneic -- having issues speaking due to sputum production, but able to understand and nod to questions. Denies abdominal pain, diarrhea. + nausea and has vomited twice this morning.  Dialyzes at [facility] on MWF schedule. He had a full HD last Friday - he is due for dialysis today. Uses L AVF as access - no recent issues noted, does not appear to be infected.   Past Medical History:  Diagnosis Date  . Chest pain 01/10/2016  . Chronic combined systolic and diastolic heart failure ([place])   . Coronary artery disease    a.12/22/15: NSTEMI s/p overlapping DES x2 and balloon angioplasty to distal AV groove Circumflex (too small for a stent).   . Coronary artery disease due to lipid rich plaque 01/10/2016  . Dehydration 08/22/2012  . Depression   . Elevated troponin   . ESRD (end stage renal disease) on dialysis ([place])   . Essential hypertension   . History of completed stroke 05/28/2017  . Hypercholesteremia   .  Hypertension   . Hypertensive heart disease with heart failure ([place])   . Ischemic cardiomyopathy 03/13/2016  . Normocytic anemia 05/28/2017  . NSTEMI (non-ST elevated myocardial infarction) ([place]) 12/22/2015  . Prostate infection   . Status post coronary artery stent placement   . Syncope 03/13/2016  . Tobacco abuse   . Type 2 diabetes mellitus with diabetic nephropathy, without long-term current use of insulin ([place])   . Type II diabetes mellitus ([place])   . Uncontrolled hypertension 05/28/2017  . Unstable angina pectoris ([place]) 01/11/2016   Past Surgical History:  Procedure Laterality Date  . BASCILIC VEIN TRANSPOSITION Left 06/28/2017   Procedure: LEFT UPPER ARM ARTERIOVENOUS GORTEX-GRAFT PLACEMENT;  Surgeon: Angelia Mould, MD;  Location: [place];  Service: Vascular;  Laterality: Left;  . CARDIAC CATHETERIZATION N/A 12/22/2015   Procedure: Left Heart Cath and Coronary Angiography;  Surgeon: Burnell Blanks, MD;  Location: [place] CV [facility];  Service: Cardiovascular;  Laterality: N/A;  . CARDIAC CATHETERIZATION N/A 12/22/2015   Procedure: Coronary Stent Intervention;  Surgeon: Burnell Blanks, MD;  Location: [place] [facility];  Service: Cardiovascular;  Laterality: N/A;  . CARDIAC CATHETERIZATION N/A 01/11/2016   Procedure: Left Heart Cath and Coronary Angiography;  Surgeon: Sherren Mocha, MD;  Location: [place] [facility];  Service: Cardiovascular;  Laterality: N/A;  . CARDIAC CATHETERIZATION N/A 01/11/2016   Procedure: Intravascular Pressure Wire/FFR Study;  Surgeon: Sherren Mocha, MD;  Location: [place] [facility];  Service:  Cardiovascular;  Laterality: N/A;  . CARDIAC CATHETERIZATION N/A 01/11/2016   Procedure: Coronary Stent Intervention;  Surgeon: Sherren Mocha, MD;  Location: [place] [facility];  Service: Cardiovascular;  Laterality: N/A;  . CORONARY ANGIOPLASTY WITH STENT PLACEMENT  12/22/2015  . INSERTION OF DIALYSIS CATHETER Right 06/28/2017   Procedure: INSERTION  OF DIALYSIS CATHETER RIGHT INTERNAL JUGULAR;  Surgeon: Angelia Mould, MD;  Location: [facility] OR;  Service: Vascular;  Laterality: Right;   Family History  Problem Relation Age of Onset  . CAD Brother   . CVA Brother   . Chronic Renal Failure Brother 50       on HD  . Hypertension Mother   . Diabetes Mother   . Rheum arthritis Mother   . Healthy Daughter   . Healthy Son    Social History:  reports that he quit smoking about 2 years ago. His smoking use included cigarettes. He has a 21.75 pack-year smoking history. He has never used smokeless tobacco. He reports previous alcohol use of about 7.0 standard drinks of alcohol per week. He reports that he does not use drugs.  ROS: As per HPI otherwise negative.  Physical Exam: Vitals:   11/30/19 1210 11/30/19 1215 11/30/19 1230 11/30/19 1245  BP:  (!) 202/83 (!) 153/78 (!) 166/89  Pulse: (!) 113 (!) 119 (!) 109 (!) 109  Resp: (!) 36 (!) 28 (!) 34 (!) 39  Temp:      TempSrc:      SpO2: 94% 91% 95% 95%  Weight:      Height:         General: Ill appearing man, work of breathing present - high flow [place] in place Head: Normocephalic, atraumatic, sclera non-icteric, mucus membranes are moist. Neck: Supple without lymphadenopathy/masses.  Lungs: Diffuse rhonchi throughout Heart: Tachycardic, no murmur Abdomen: Soft, non-tender, non-distended with normoactive bowel sounds.  Musculoskeletal:  Strength and tone appear normal for age. Lower extremities: No edema or ischemic changes, no open wounds. Neuro: Alert and oriented X 3. Moves all extremities spontaneously. Nods head to questions. Dialysis Access: L AVF + bruit/thrill  No Known Allergies Prior to Admission medications   Medication Sig Start Date End Date Taking? Authorizing Provider  aspirin 81 MG chewable tablet CHEW ONE TABLET BY MOUTH DAILY Patient taking differently: Chew 81 mg by mouth daily.  04/22/19   [name], MD  atorvastatin (LIPITOR) 10 MG tablet Take 1  tablet (10 mg total) by mouth daily. Please call to schedule appointment for future refills. 08/11/19   Imogene Burn, PA-C  B Complex-C-Folic Acid (RENA-VITE RX) 1 MG TABS TAKE ONE TABLET BY MOUTH AT BEDTIME 11/19/19   [name], MD  calcitRIOL (ROCALTROL) 0.5 MCG capsule TAKE 1 CAPSULE BY MOUTH ON MONDAY, WEDNESDAY, AND FRIDAY WITH HEMODIALYSIS 03/17/18   [name], MD  carvedilol (COREG) 6.25 MG tablet TAKE ONE TABLET BY MOUTH TWICE A DAY WITH A MEAL 07/07/19   Imogene Burn, PA-C  clopidogrel (PLAVIX) 75 MG tablet Take 1 tablet (75 mg total) by mouth daily. Please make overdue appt with Dr. Angelena Form before anymore refills. 1st attempt 10/13/19   Burnell Blanks, MD  fluticasone (FLONASE) 50 MCG/ACT nasal spray Place 1-2 sprays into both nostrils daily. 03/05/19   [name], MD  Fluticasone-Salmeterol (ADVAIR DISKUS IN) Inhale 2 puffs into the lungs 2 (two) times daily.    [provider]  glucose blood (ONETOUCH VERIO) test strip 1 each by Other route as  needed for other. Use as instructed 1-2 times per day. 04/24/18   [name], MD  guaiFENesin (MUCINEX) 600 MG 12 hr tablet Take by mouth 2 (two) times daily as needed.    [provider]  Ipratropium-Albuterol (COMBIVENT RESPIMAT IN) Inhale 1-2 puffs into the lungs every 6 (six) hours as needed.    [provider]  Lancets (ONETOUCH DELICA PLUS [id number]) MISC USE ONELANCET TO TEST - FOUR TIMES A DAY 08/29/18   [name], MD  nitroGLYCERIN (NITROSTAT) 0.4 MG SL tablet Place 1 tablet (0.4 mg total) under the tongue every 5 (five) minutes x 3 doses as needed for chest pain. 12/26/15   Cheryln Manly, NP  sertraline (ZOLOFT) 100 MG tablet TAKE ONE TABLET BY MOUTH DAILY 06/09/19   [name], MD  sevelamer carbonate (RENVELA) 800 MG tablet Take 1 tablet (800 mg total) by mouth 3 (three) times daily with meals. 08/15/17   [name], MD  STOOL SOFTENER 100 MG capsule  TAKE ONE CAPSULE BY MOUTH TWICE A DAY 06/09/19   [name], MD  traZODone (DESYREL) 50 MG tablet TAKE ONE TABLET BY MOUTH EVERY NIGHT AT BEDTIME 11/07/19   [name], MD   Current Facility-Administered Medications  Medication Dose Route Frequency Provider Last Rate Last Admin  . vancomycin (VANCOREADY) IVPB 1250 mg/250 mL  1,250 mg Intravenous Once von Dohlen, Haley B, RPH 166.7 mL/hr at 11/30/19 1259 1,250 mg at 11/30/19 1259  . [START ON 12/01/2019] vancomycin variable dose per unstable renal function (pharmacist dosing)   Does not apply See admin instructions von Caren Griffins, [facility]       Current Outpatient Medications  Medication Sig Dispense Refill  . aspirin 81 MG chewable tablet CHEW ONE TABLET BY MOUTH DAILY (Patient taking differently: Chew 81 mg by mouth daily. ) 90 tablet 3  . atorvastatin (LIPITOR) 10 MG tablet Take 1 tablet (10 mg total) by mouth daily. Please call to schedule appointment for future refills. 90 tablet 0  . B Complex-C-Folic Acid (RENA-VITE RX) 1 MG TABS TAKE ONE TABLET BY MOUTH AT BEDTIME 90 tablet 1  . calcitRIOL (ROCALTROL) 0.5 MCG capsule TAKE 1 CAPSULE BY MOUTH ON MONDAY, WEDNESDAY, AND FRIDAY WITH HEMODIALYSIS 30 capsule 0  . carvedilol (COREG) 6.25 MG tablet TAKE ONE TABLET BY MOUTH TWICE A DAY WITH A MEAL 180 tablet 3  . clopidogrel (PLAVIX) 75 MG tablet Take 1 tablet (75 mg total) by mouth daily. Please make overdue appt with Dr. Angelena Form before anymore refills. 1st attempt 30 tablet 0  . fluticasone (FLONASE) 50 MCG/ACT nasal spray Place 1-2 sprays into both nostrils daily. 16 g 3  . Fluticasone-Salmeterol (ADVAIR DISKUS IN) Inhale 2 puffs into the lungs 2 (two) times daily.    Marland Kitchen glucose blood (ONETOUCH VERIO) test strip 1 each by Other route as needed for other. Use as instructed 1-2 times per day. 100 each 2  . guaiFENesin (MUCINEX) 600 MG 12 hr tablet Take by mouth 2 (two) times daily as needed.    . Ipratropium-Albuterol (COMBIVENT RESPIMAT  IN) Inhale 1-2 puffs into the lungs every 6 (six) hours as needed.    . Lancets (ONETOUCH DELICA PLUS [id number]) MISC USE ONELANCET TO TEST - FOUR TIMES A DAY 100 each 1  . nitroGLYCERIN (NITROSTAT) 0.4 MG SL tablet Place 1 tablet (0.4 mg total) under the tongue every 5 (five) minutes x 3 doses as needed for chest pain. 25 tablet 3  .  sertraline (ZOLOFT) 100 MG tablet TAKE ONE TABLET BY MOUTH DAILY 90 tablet 1  . sevelamer carbonate (RENVELA) 800 MG tablet Take 1 tablet (800 mg total) by mouth 3 (three) times daily with meals. 90 tablet 1  . STOOL SOFTENER 100 MG capsule TAKE ONE CAPSULE BY MOUTH TWICE A DAY 90 capsule 3  . traZODone (DESYREL) 50 MG tablet TAKE ONE TABLET BY MOUTH EVERY NIGHT AT BEDTIME 90 tablet 0   Labs: Basic Metabolic Panel: Recent Labs  Lab 11/30/19 1209  NA 125*  K 4.4   CBC: Recent Labs  Lab 11/30/19 1138 11/30/19 1209  WBC 23.7*  --   HGB 11.4* 12.6*  HCT 36.1* 37.0*  MCV 98.9  --   PLT 396  --    Studies/Results: DG Chest Portable 1 View  Result Date: 11/30/2019 CLINICAL DATA:  Shortness of breath EXAM: PORTABLE CHEST 1 VIEW COMPARISON:  03/19/2019 FINDINGS: The heart size and mediastinal contours are within normal limits. Dense airspace consolidation within the medial aspect of the right lung base compatible with pneumonia. Left lung is clear. No pleural effusion or pneumothorax. The visualized skeletal structures are unremarkable. IMPRESSION: Right lower lobe pneumonia. Recommend radiographic follow-up to resolution. Electronically Signed   By: Davina Poke D.O.   On: 11/30/2019 12:03    Dialysis Orders:  MWF at [facility] - full HD 10/22, left 1kg up 3:45hr, 400/600, EDW 56kg, 2K/2Ca, AVG, no heparin - Mircera 86mcg IV q 4 weeks - Calcitriol 1.74mcg PO q HD - Venofer 50mg  IV weekly  Assessment/Plan: 1.  RLL pneumonia: On imaging. BCx drawn, started on Vanc/Cefepime. 2.  Acute Hypoxic Resp Failure: Very dyspneic - felt d/t #1, but would  consider chest CT to rule out PE? Doesn't appear to have pulm edema on CXR. Per weights , he is AT his dry weight. 3.  ^ Troponin: ?demand ischemia - per primary team. 4.  ESRD: Usual MWF schedule - due for HD. He appears a little unstable to run overnight - will plan to dialyze him first thing in AM. 5.  Hypertension/volume: BP high - no edema on exam. See above. 6.  Anemia of ESRD: Hgb > 12, no ESA needed for now. 7.  Metabolic bone disease: Labs pending - resume home binders once eating. 8.  T2DM 9.  CAD/Hx stents 10.  Hx CVA  Pollyann Kennedy 11/30/2019, 1:17 PM  [employer]

## 2019-11-30 NOTE — ED Provider Notes (Signed)
[facility] EMERGENCY DEPARTMENT Provider Note   CSN: [id number] Arrival date & time: 11/30/19  1123     History Chief Complaint  Patient presents with  . Shortness of Breath    Jeffery Young is a 54 y.o. male.  Per EMS, they were called out for shortness of breath.  On their arrival, patient was 55% on room air and improved to 95% on 10 L nonrebreather.  Patient was recently diagnosed with pneumonia.  He is ESRD on HD MWF and has been fully compliant.  He has not been to dialysis yet today due to his shortness of breath, so last dialysis was 3 days ago.  He has had a frequent cough productive of yellow thick mucousy sputum.  EMS provided  The history is provided by the patient and the EMS personnel.  Shortness of Breath Severity:  Severe Onset quality:  Gradual Duration:  3 days Timing:  Constant Progression:  Worsening Chronicity:  New Relieved by:  Oxygen Worsened by:  Nothing Associated symptoms: cough and wheezing        Past Medical History:  Diagnosis Date  . Chest pain 01/10/2016  . Chronic combined systolic and diastolic heart failure ([place])   . Coronary artery disease    a.12/22/15: NSTEMI s/p overlapping DES x2 and balloon angioplasty to distal AV groove Circumflex (too small for a stent).   . Coronary artery disease due to lipid rich plaque 01/10/2016  . Dehydration 08/22/2012  . Depression   . Elevated troponin   . ESRD (end stage renal disease) on dialysis ([place])   . Essential hypertension   . History of completed stroke 05/28/2017  . Hypercholesteremia   . Hypertension   . Hypertensive heart disease with heart failure ([place])   . Ischemic cardiomyopathy 03/13/2016  . Normocytic anemia 05/28/2017  . NSTEMI (non-ST elevated myocardial infarction) ([place]) 12/22/2015  . Prostate infection   . Status post coronary artery stent placement   . Syncope 03/13/2016  . Tobacco abuse   . Type 2 diabetes mellitus with diabetic nephropathy, without long-term  current use of insulin ([place])   . Type II diabetes mellitus ([place])   . Uncontrolled hypertension 05/28/2017  . Unstable angina pectoris ([place]) 01/11/2016    Patient Active Problem List   Diagnosis Date Noted  . COPD GOLD III  10/24/2017  . CAD (coronary artery disease), native coronary artery 10/10/2017  . ESRD (end stage renal disease) on dialysis ([place]) 06/25/2017  . Uncontrolled hypertension 05/28/2017  . Depression 05/28/2017  . Normocytic anemia 05/28/2017  . History of completed stroke 05/28/2017  . Syncope 03/13/2016  . Ischemic cardiomyopathy 03/13/2016  . Unstable angina pectoris ([place]) 01/11/2016  . Hypertensive heart disease with heart failure ([place])   . Chronic combined systolic and diastolic heart failure ([place])   . Elevated troponin   . Chest pain 01/10/2016  . Coronary artery disease due to lipid rich plaque 01/10/2016  . Status post coronary artery stent placement   . Type 2 diabetes mellitus with diabetic nephropathy, without long-term current use of insulin ([place])   . Tobacco abuse   . Essential hypertension   . Hypercholesteremia   . NSTEMI (non-ST elevated myocardial infarction) ([place]) 12/22/2015  . Lightheadedness 08/22/2012  . Dehydration 08/22/2012    Past Surgical History:  Procedure Laterality Date  . BASCILIC VEIN TRANSPOSITION Left 06/28/2017   Procedure: LEFT UPPER ARM ARTERIOVENOUS GORTEX-GRAFT PLACEMENT;  Surgeon: Angelia Mould, MD;  Location: [facility];  Service: Vascular;  Laterality: Left;  .  CARDIAC CATHETERIZATION N/A 12/22/2015   Procedure: Left Heart Cath and Coronary Angiography;  Surgeon: Burnell Blanks, MD;  Location: [facility];  Service: Cardiovascular;  Laterality: N/A;  . CARDIAC CATHETERIZATION N/A 12/22/2015   Procedure: Coronary Stent Intervention;  Surgeon: Burnell Blanks, MD;  Location: [facility];  Service: Cardiovascular;  Laterality: N/A;  . CARDIAC CATHETERIZATION N/A 01/11/2016   Procedure: Left  Heart Cath and Coronary Angiography;  Surgeon: Sherren Mocha, MD;  Location: [facility];  Service: Cardiovascular;  Laterality: N/A;  . CARDIAC CATHETERIZATION N/A 01/11/2016   Procedure: Intravascular Pressure Wire/FFR Study;  Surgeon: Sherren Mocha, MD;  Location: [facility];  Service: Cardiovascular;  Laterality: N/A;  . CARDIAC CATHETERIZATION N/A 01/11/2016   Procedure: Coronary Stent Intervention;  Surgeon: Sherren Mocha, MD;  Location: [facility];  Service: Cardiovascular;  Laterality: N/A;  . CORONARY ANGIOPLASTY WITH STENT PLACEMENT  12/22/2015  . INSERTION OF DIALYSIS CATHETER Right 06/28/2017   Procedure: INSERTION OF DIALYSIS CATHETER RIGHT INTERNAL JUGULAR;  Surgeon: Angelia Mould, MD;  Location: [facility] OR;  Service: Vascular;  Laterality: Right;       Family History  Problem Relation Age of Onset  . CAD Brother   . CVA Brother   . Chronic Renal Failure Brother 50       on HD  . Hypertension Mother   . Diabetes Mother   . Rheum arthritis Mother   . Healthy Daughter   . Healthy Son     Social History   Tobacco Use  . Smoking status: Former Smoker    Packs/day: 0.75    Years: 29.00    Pack years: 21.75    Types: Cigarettes    Quit date: 05/28/2017    Years since quitting: 2.5  . Smokeless tobacco: Never Used  Vaping Use  . Vaping Use: Never used  Substance Use Topics  . Alcohol use: Not Currently    Alcohol/week: 7.0 standard drinks    Types: 7 Cans of beer per week    Comment: last drink was prior to last hospitalization  . Drug use: No    Home Medications Prior to Admission medications   Medication Sig Start Date End Date Taking? Authorizing Provider  albuterol (VENTOLIN HFA) 108 (90 Base) MCG/ACT inhaler Inhale 2 puffs into the lungs every 6 (six) hours as needed for wheezing or shortness of breath.   Yes [provider]  amoxicillin-clavulanate (AUGMENTIN) 500-125 MG tablet Take 1 tablet by mouth at bedtime.   Yes  [provider]  aspirin 81 MG chewable tablet CHEW ONE TABLET BY MOUTH DAILY Patient taking differently: Chew 81 mg by mouth daily.  04/22/19  Yes [name], MD  atorvastatin (LIPITOR) 10 MG tablet Take 1 tablet (10 mg total) by mouth daily. Please call to schedule appointment for future refills. Patient taking differently: Take 10 mg by mouth daily.  08/11/19  Yes Imogene Burn, PA-C  calcitRIOL (ROCALTROL) 0.5 MCG capsule TAKE 1 CAPSULE BY MOUTH ON MONDAY, WEDNESDAY, AND FRIDAY WITH HEMODIALYSIS Patient taking differently: Take 0.5 mcg by mouth See admin instructions. Take 1 capsule (0.5 mcg) by mouth every Mon, Wed, and Friday 03/17/18  Yes [name], MD  carvedilol (COREG) 6.25 MG tablet TAKE ONE TABLET BY MOUTH TWICE A DAY WITH A MEAL Patient taking differently: Take 6.25 mg by mouth 2 (two) times daily with a meal.  07/07/19  Yes Imogene Burn, PA-C  clopidogrel (  PLAVIX) 75 MG tablet Take 1 tablet (75 mg total) by mouth daily. Please make overdue appt with Dr. Angelena Form before anymore refills. 1st attempt Patient taking differently: Take 75 mg by mouth daily.  10/13/19  Yes Burnell Blanks, MD  docusate sodium (COLACE) 100 MG capsule Take 100 mg by mouth 2 (two) times daily.   Yes [provider]  Fluticasone-Salmeterol (ADVAIR DISKUS IN) Inhale 1 puff into the lungs 2 (two) times daily.    Yes [provider]  guaiFENesin (MUCINEX) 600 MG 12 hr tablet Take 600 mg by mouth 2 (two) times daily.    Yes [provider]  nitroGLYCERIN (NITROSTAT) 0.4 MG SL tablet Place 1 tablet (0.4 mg total) under the tongue every 5 (five) minutes x 3 doses as needed for chest pain. Patient taking differently: Place 0.4 mg under the tongue every 5 (five) minutes as needed for chest pain (max 3 doses).  12/26/15  Yes Cheryln Manly, NP  predniSONE (DELTASONE) 20 MG tablet Take 20 mg by mouth daily with breakfast.   Yes [provider]    Probiotic Product (PROBIOTIC ACIDOPHILUS BEADS PO) Take 1 capsule by mouth 2 (two) times daily.   Yes [provider]  sertraline (ZOLOFT) 100 MG tablet TAKE ONE TABLET BY MOUTH DAILY Patient taking differently: Take 100 mg by mouth daily.  06/09/19  Yes [name], MD  sevelamer carbonate (RENVELA) 800 MG tablet Take 1 tablet (800 mg total) by mouth 3 (three) times daily with meals. Patient taking differently: Take 2,400 mg by mouth 3 (three) times daily with meals.  08/15/17  Yes [name], MD  traZODone (DESYREL) 50 MG tablet TAKE ONE TABLET BY MOUTH EVERY NIGHT AT BEDTIME Patient taking differently: Take 50 mg by mouth at bedtime.  11/07/19  Yes [name], MD  umeclidinium bromide (INCRUSE ELLIPTA) 62.5 MCG/INH AEPB Inhale 1 puff into the lungs daily.    Yes [provider]  B Complex-C-Folic Acid (RENA-VITE RX) 1 MG TABS TAKE ONE TABLET BY MOUTH AT BEDTIME Patient not taking: Reported on 11/30/2019 11/19/19   [name], MD  fluticasone [facility]) 50 MCG/ACT nasal spray Place 1-2 sprays into both nostrils daily. Patient not taking: Reported on 11/30/2019 03/05/19   [name], MD  glucose blood [facility] VERIO) test strip 1 each by Other route as needed for other. Use as instructed 1-2 times per day. 04/24/18   [name], MD  Lancets (ONETOUCH DELICA PLUS [id number]) [place] USE ONELANCET TO TEST - FOUR TIMES A DAY 08/29/18   [name], MD  STOOL SOFTENER 100 MG capsule TAKE ONE CAPSULE BY MOUTH TWICE A DAY Patient not taking: Reported on 11/30/2019 06/09/19   [name], MD    Allergies    Patient has no known allergies.  Review of Systems   Review of Systems  Unable to perform ROS: Severe respiratory distress  Respiratory: Positive for cough, shortness of breath and wheezing.     Physical Exam Updated Vital Signs BP (!) 156/76   Pulse (!) 104   Temp 98.7 F (37.1 C) (Oral)   Resp (!) 26   Ht 5\' 5"  (1.651 m)    Wt 56 kg   SpO2 98%   BMI 20.54 kg/m   Physical Exam Vitals and nursing note reviewed.  Constitutional:      General: He is in acute distress.     Appearance: He is ill-appearing.  HENT:  Head: Normocephalic and atraumatic.     Mouth/Throat:     Mouth: Mucous membranes are moist.     Pharynx: Oropharynx is clear. No oropharyngeal exudate.  Eyes:     Conjunctiva/sclera: Conjunctivae normal.  Cardiovascular:     Rate and Rhythm: Regular rhythm. Tachycardia present.     Heart sounds: No murmur heard.  No gallop.   Pulmonary:     Effort: Tachypnea, accessory muscle usage and respiratory distress present.     Breath sounds: Decreased air movement present. Wheezing (diffuse) and rhonchi (diffuse) present.     Comments: Patient frequently coughing on exam with thick yellow sputum production noted Abdominal:     Palpations: Abdomen is soft.     Tenderness: There is no abdominal tenderness.  Musculoskeletal:     Cervical back: Neck supple.     Right lower leg: No tenderness. No edema.     Left lower leg: No tenderness. No edema.  Skin:    General: Skin is warm and dry.  Neurological:     Mental Status: He is alert.     ED Results / Procedures / Treatments   Labs (all labs ordered are listed, but only abnormal results are displayed) Labs Reviewed  CBC - Abnormal; Notable for the following components:      Result Value   WBC 23.7 (*)    RBC 3.65 (*)    Hemoglobin 11.4 (*)    HCT 36.1 (*)    All other components within normal limits  COMPREHENSIVE METABOLIC PANEL - Abnormal; Notable for the following components:   Sodium 128 (*)    Chloride 81 (*)    Glucose, Bld 330 (*)    BUN 86 (*)    Creatinine, Ser 8.16 (*)    Albumin 2.9 (*)    Total Bilirubin 1.3 (*)    GFR, Estimated 7 (*)    Anion gap 19 (*)    All other components within normal limits  I-STAT VENOUS BLOOD GAS, ED - Abnormal; Notable for the following components:   pH, Ven 7.487 (*)    pO2, Ven 93.0 (*)     Bicarbonate 33.5 (*)    TCO2 35 (*)    Acid-Base Excess 9.0 (*)    Sodium 125 (*)    Calcium, Ion 1.01 (*)    HCT 37.0 (*)    Hemoglobin 12.6 (*)    All other components within normal limits  TROPONIN I (HIGH SENSITIVITY) - Abnormal; Notable for the following components:   Troponin I (High Sensitivity) 174 (*)    All other components within normal limits  TROPONIN I (HIGH SENSITIVITY) - Abnormal; Notable for the following components:   Troponin I (High Sensitivity) 195 (*)    All other components within normal limits  RESPIRATORY PANEL BY RT PCR (FLU A&B, COVID)  CULTURE, BLOOD (ROUTINE X 2)  CULTURE, BLOOD (ROUTINE X 2)  LACTIC ACID, PLASMA  LACTIC ACID, PLASMA  URINALYSIS, ROUTINE W REFLEX MICROSCOPIC    EKG EKG Interpretation  Date/Time:  Monday November 30 2019 11:36:51 EDT Ventricular Rate:  114 PR Interval:    QRS Duration: 90 QT Interval:  370 QTC Calculation: 510 R Axis:   65 Text Interpretation: Sinus tachycardia Anteroseptal infarct, age indeterminate new Prolonged QT interval Confirmed by Blanchie Dessert [phone]) on 11/30/2019 11:55:10 AM   Radiology DG Chest Portable 1 View  Result Date: 11/30/2019 CLINICAL DATA:  Shortness of breath EXAM: PORTABLE CHEST 1 VIEW COMPARISON:  03/19/2019 FINDINGS: The heart size and  mediastinal contours are within normal limits. Dense airspace consolidation within the medial aspect of the right lung base compatible with pneumonia. Left lung is clear. No pleural effusion or pneumothorax. The visualized skeletal structures are unremarkable. IMPRESSION: Right lower lobe pneumonia. Recommend radiographic follow-up to resolution. Electronically Signed   By: Davina Poke D.O.   On: 11/30/2019 12:03    Procedures Procedures (including critical care time)  Medications Ordered in ED Medications  vancomycin variable dose per unstable renal function (pharmacist dosing) (has no administration in time range)  ceFEPIme (MAXIPIME) 2 g  in sodium chloride 0.9 % 100 mL IVPB (0 g Intravenous Stopped 11/30/19 1242)  vancomycin (VANCOREADY) IVPB 1250 mg/250 mL (1,250 mg Intravenous New Bag/Given 11/30/19 1259)    ED Course  I have reviewed the triage vital signs and the nursing notes.  Pertinent labs & imaging results that were available during my care of the patient were reviewed by me and considered in my medical decision making (see chart for details).    MDM Rules/Calculators/A&P                          The patient is a 54yo male, PMH ESRD on HD MWF, CHF, HTN, HLD, recent diagnosis of pneumonia who presents to the ED via EMS for SOB.  On my initial evaluation, the patient is mildly tachycardic but otherwise hemodynamically stable, afebrile, ill-appearing. Physical exam remarkable for decreased breath sounds in the right lower lobe but diffuse wheezing and rhonchi, tachypnea with increased work of breathing.  Likely acute hypoxic respiratory failure secondary to pneumonia.  Patient was recently diagnosed with pneumonia and was Covid negative.  Ordered IV vancomycin and cefepime for pneumonia but deferred fluids since patient is ESRD on HD and is due for HD. VBG with mild respiratory alkalosis.  Covid negative.  Leukocytosis with elevated troponin, likely from demand from hypoxia, but lactic acid normal.  Chest x-ray remarkable for right lower lobe pneumonia.  RT place patient on high flow nasal cannula. On reevaluation, patient satting well and has improved work of breathing and and some improvement in tachypnea. Advised patient of need for admission, and he initially requested to go home. Informed patient that he was requiring a lot of oxygen and if he went back to his SNF, he would quite possibly die today from lack of oxygen. Patient then seemed more amenable to admission.   Consulted family medicine for admission for acute hypoxic respiratory failure secondary to RLL pneumonia. Patient accepted to their service and admitted  in stable condition.   The care of this patient was overseen by Dr. Maryan Rued, who agreed with evaluation and plan of care.    Final Clinical Impression(s) / ED Diagnoses Final diagnoses:  HCAP (healthcare-associated pneumonia)    Rx / DC Orders ED Discharge Orders    None       Launa Flight, MD 11/30/19 [place], [place], MD 12/03/19 2218

## 2019-11-30 NOTE — Progress Notes (Signed)
Pharmacy Antibiotic Note  Jeffery Young is a 54 y.o. male admitted on 11/30/2019 with pneumonia.  Pharmacy has been consulted for vancomycin dosing. ESRD on HD MWF - last HD 10/22 PTA. 1x dose of cefepime also ordered per EDP.  Plan: Cefepime 2g IV x 1 per EDP - f/u if to continue Vancomycin 1250mg  IV x 1; then 500mg  IV qHD Monitor clinical progress, c/s, abx plan/LOT Pre-HD vancomycin level as indicated F/u HD schedule/tolerance inpatient for antibiotic maintenance doses      Temp (24hrs), Avg:98.7 F (37.1 C), Min:98.7 F (37.1 C), Max:98.7 F (37.1 C)  No results for input(s): WBC, CREATININE, LATICACIDVEN, VANCOTROUGH, VANCOPEAK, VANCORANDOM, GENTTROUGH, GENTPEAK, GENTRANDOM, TOBRATROUGH, TOBRAPEAK, TOBRARND, AMIKACINPEAK, AMIKACINTROU, AMIKACIN in the last 168 hours.  CrCl cannot be calculated (Patient's most recent lab result is older than the maximum 21 days allowed.).    No Known Allergies   Jeffery Young, PharmD, BCPS Please check AMION for all [name] contact numbers Clinical Pharmacist 11/30/2019 12:00 PM

## 2019-11-30 NOTE — ED Notes (Signed)
Pt continuous sliding self to the edge of bed, pt notably desating with movement, pt removed hi flow nasal cannula from nares SPO2 dropped to 70s on RA. Pt readjusted in bed to safe position and nasal cannula placed in nose, SPO2 slow to increase. Provider team at bedside with respiratory post this RN assessment, pt has productive cough and crackles upon assessment.

## 2019-11-30 NOTE — H&P (Addendum)
[facility] Admission History and Physical Service Pager: [phone]  Patient name: Jeffery Young Medical record number: [id number] Date of birth: 11-20-1965 Age: 54 y.o. Gender: male  Primary Care Provider: [name], MD Consultants: Nephrology  Code Status: Full Preferred Emergency Contact: Jarrad Mclees (spouse) [phone]  Chief Complaint: hypoxia   Assessment and Plan: [name] is a 54 y.o. male presenting with hypoxia secondary to PNA . PMH is significant for ESRD, HTN, HLD, Type 2 DM and history of CVA.   Acute Hypoxic Respiratory Failure 2/2 CAP Patient presented with worsening dyspnea, afebrile and productive cough with clear sputum for at least two days. Presented from [facility], EMS called and O2 sat noted to be 55% on room air. Placed on nonrebreather mask and sats significantly improved Currently on 20L HFNC. Leukocytosis noted on admission of 23.7. At baseline patient does not require any additional oxygen supplementation. COVID negative. Lactic acid wnl.  In the ED, patient  Given vancomycin and cefepime. On exam diffuse rhonchi noted with decreased air movement within all lung fields.  Increasing oxygen requirements and hypoxia likely primarily due to pneumonia in the setting of chronic COPD. Will treat as Community Acquired Pneumonia.  Consider CHF but less likely given inconsistency of imaging on admission, can consider BNP if higher suspicion. Also concern is PE given clinical presentation but Wells score of 1.5 along with no LE edema and no calf tenderness makes this less likely. Consider CTA if higher concern noted.  -Admit to FPTS, attending Dr. McDiarmid  -f/u blood cultures -monitor respiratory status -tylenol 650 mg prn pain  -wean O2 as tolerated  -continue home advair 1 puff bid  -continue home incruse ellipta  -pending UA -continue cefepime and vancomycin - will add azithromycin - will likely stop  cefepime and vanc in am and switch to CTX -pending am CBC, CMP, Mg, Phosph -consider BNP and CTA chest  -PT/OT   ESRD on HD Presents from rehab facility and gets picked up for HD with MWF schedule. Is compliant with his dialysis sessions. Unable to attend dialysis today due to dyspnea. On admission, Cr 8.16 and GFR 7.  -nephrology consult for HD inpatient  - AM renal function panel  Elevated troponins 174>195.  Likely demand 2/2 highly elevated BP and tachycardia w/ ESRD causing some degree of false elevation.  No obvious ST changes on EKG although not a great study.  Will repeat trops.  - f/u troponins. Cards consult if necessary.   Hyponatremia  On admission, noted to have Na 128. Likely in the setting of dehydration and missed dialysis session.  -monitor BMP  HTN BP noted to be hypertensive at 147/38 on admission. Home med includes coreg 6.25 mg bid. Per patient, he is compliant on medication.  -continue home coreg  Normocytic anemia Admitted with Hgb 11.4, baseline appears to be in the 9-11 ranges. Likely secondary to anemia of chronic disease and ESRD.  -monitor Hgb  Type 2 DM Last A1c 7.3 03/2019. On admission, blood glucose 330. Currently not on any diabetic regimen at home.  -sSSI -monitor CBGs -f/u with PCP to determine home regimen   HLD Home meds include atorvastatin 10 mg daily. Most recent lipid panel on 10/16/2018 demonstrated elevated triglycerides of 194. Home med includes atorvastatin 10 mg. -continue home statin -lifestyle modification counseling outpatient   History of tobacco use Admits to smoker 1PPD, quit 1 year ago. -continued education on maintaining smoking cessation   History of CVA  Patient reports recent CVA about a year ago although prior imaging may suggest CVA occurrence well before a year ago. MRI brain in 11/2017 demonstrated chronic lacunar infarctions in the bilateral basal ganglia, bilateral thalami and bilateral pons with moderate  periventricular and subcortical chronic small vessel ischemic disease. No acute findings noted. Residual left-sided hemiplegia, ambulates with assistance from walker and wheelchair at baseline. Home meds include aspirin 81 mg daily.  -continue home aspirin   Alcohol use Endorses drinking a 6-pack of beer every weekend.  -thiamine -multivitamin daily  -continued education   Depression Home meds include sertraline 100 mg. -continue sertraline   Constipation  -miralax 17 g prn   FEN/GI: renal and carb modified diet  Prophylaxis: Lovenox  Disposition: admit to progressive, attending Dr. McDiarmid   History of Present Illness:  Jeffery Young is a 54 y.o. male presenting with 2 day history of cough and non-bloody emesis. Denies hemoptysis, headache, weakness, diarrhea, chest pain and any generalized or localized pain. Denies any sick contacts. Patient denies having any illnesses or hospitalizations recently. Per chart review, was prescribed augmentin to start on 10/19. This seems inconsistent with patient reporting 2 day history of symptoms. Does not use oxygen at baseline. Uses a walker and wheelchair at home. Previously lived with wife but over the past month has been living at a rehab facility [facility]) because it became difficult for wife to continuously care for him. Uses a walker and wheelchair at baseline to ambulate. Former smoker 1PPD, quit 1 year ago. Admits to alcohol use of 6 pack beer every week.    Review Of Systems: Per HPI with the following additions:   Review of Systems  Constitutional: Negative for chills and fever.  Respiratory: Positive for cough and shortness of breath.   Cardiovascular: Negative for chest pain and leg swelling.  Gastrointestinal: Positive for nausea and vomiting. Negative for diarrhea.  Skin: Negative for rash.  Neurological: Negative for dizziness, weakness and headaches.     Patient Active Problem List   Diagnosis Date Noted   . COPD GOLD III  10/24/2017  . CAD (coronary artery disease), native coronary artery 10/10/2017  . ESRD (end stage renal disease) on dialysis ([place]) 06/25/2017  . Uncontrolled hypertension 05/28/2017  . Depression 05/28/2017  . Normocytic anemia 05/28/2017  . History of completed stroke 05/28/2017  . Syncope 03/13/2016  . Ischemic cardiomyopathy 03/13/2016  . Unstable angina pectoris ([place]) 01/11/2016  . Hypertensive heart disease with heart failure ([place])   . Chronic combined systolic and diastolic heart failure ([place])   . Elevated troponin   . Chest pain 01/10/2016  . Coronary artery disease due to lipid rich plaque 01/10/2016  . Status post coronary artery stent placement   . Type 2 diabetes mellitus with diabetic nephropathy, without long-term current use of insulin ([place])   . Tobacco abuse   . Essential hypertension   . Hypercholesteremia   . NSTEMI (non-ST elevated myocardial infarction) ([place]) 12/22/2015  . Lightheadedness 08/22/2012  . Dehydration 08/22/2012    Past Medical History: Past Medical History:  Diagnosis Date  . Chest pain 01/10/2016  . Chronic combined systolic and diastolic heart failure ([place])   . Coronary artery disease    a.12/22/15: NSTEMI s/p overlapping DES x2 and balloon angioplasty to distal AV groove Circumflex (too small for a stent).   . Coronary artery disease due to lipid rich plaque 01/10/2016  . Dehydration 08/22/2012  . Depression   . Elevated troponin   . ESRD (  end stage renal disease) on dialysis ([place])   . Essential hypertension   . History of completed stroke 05/28/2017  . Hypercholesteremia   . Hypertension   . Hypertensive heart disease with heart failure ([place])   . Ischemic cardiomyopathy 03/13/2016  . Normocytic anemia 05/28/2017  . NSTEMI (non-ST elevated myocardial infarction) ([place]) 12/22/2015  . Prostate infection   . Status post coronary artery stent placement   . Syncope 03/13/2016  . Tobacco abuse   . Type 2 diabetes mellitus with  diabetic nephropathy, without long-term current use of insulin ([place])   . Type II diabetes mellitus ([place])   . Uncontrolled hypertension 05/28/2017  . Unstable angina pectoris ([place]) 01/11/2016    Past Surgical History: Past Surgical History:  Procedure Laterality Date  . BASCILIC VEIN TRANSPOSITION Left 06/28/2017   Procedure: LEFT UPPER ARM ARTERIOVENOUS GORTEX-GRAFT PLACEMENT;  Surgeon: Angelia Mould, MD;  Location: [place];  Service: Vascular;  Laterality: Left;  . CARDIAC CATHETERIZATION N/A 12/22/2015   Procedure: Left Heart Cath and Coronary Angiography;  Surgeon: Burnell Blanks, MD;  Location: [place] [facility];  Service: Cardiovascular;  Laterality: N/A;  . CARDIAC CATHETERIZATION N/A 12/22/2015   Procedure: Coronary Stent Intervention;  Surgeon: Burnell Blanks, MD;  Location: [place] [facility];  Service: Cardiovascular;  Laterality: N/A;  . CARDIAC CATHETERIZATION N/A 01/11/2016   Procedure: Left Heart Cath and Coronary Angiography;  Surgeon: Sherren Mocha, MD;  Location: [place] [facility];  Service: Cardiovascular;  Laterality: N/A;  . CARDIAC CATHETERIZATION N/A 01/11/2016   Procedure: Intravascular Pressure Wire/FFR Study;  Surgeon: Sherren Mocha, MD;  Location: [place] [facility];  Service: Cardiovascular;  Laterality: N/A;  . CARDIAC CATHETERIZATION N/A 01/11/2016   Procedure: Coronary Stent Intervention;  Surgeon: Sherren Mocha, MD;  Location: [place] [facility];  Service: Cardiovascular;  Laterality: N/A;  . CORONARY ANGIOPLASTY WITH STENT PLACEMENT  12/22/2015  . INSERTION OF DIALYSIS CATHETER Right 06/28/2017   Procedure: INSERTION OF DIALYSIS CATHETER RIGHT INTERNAL JUGULAR;  Surgeon: Angelia Mould, MD;  [facility];  Service: Vascular;  Laterality: Right;    Social History: Social History   Tobacco Use  . Smoking status: Former Smoker    Packs/day: 0.75    Years: 29.00    Pack years: 21.75    Types: Cigarettes    Quit date:  05/28/2017    Years since quitting: 2.5  . Smokeless tobacco: Never Used  Vaping Use  . Vaping Use: Never used  Substance Use Topics  . Alcohol use: Not Currently    Alcohol/week: 7.0 standard drinks    Types: 7 Cans of beer per week    Comment: last drink was prior to last hospitalization  . Drug use: No    Please also refer to relevant sections of EMR.  Family History: Family History  Problem Relation Age of Onset  . CAD Brother   . CVA Brother   . Chronic Renal Failure Brother 50       on HD  . Hypertension Mother   . Diabetes Mother   . Rheum arthritis Mother   . Healthy Daughter   . Healthy Son      Allergies and Medications: No Known Allergies No current facility-administered medications on file prior to encounter.   Current Outpatient Medications on File Prior to Encounter  Medication Sig Dispense Refill  . albuterol (VENTOLIN HFA) 108 (90 Base) MCG/ACT inhaler Inhale 2 puffs into the lungs every 6 (six) hours as needed for  wheezing or shortness of breath.    Marland Kitchen amoxicillin-clavulanate (AUGMENTIN) 500-125 MG tablet Take 1 tablet by mouth at bedtime.    Marland Kitchen aspirin 81 MG chewable tablet CHEW ONE TABLET BY MOUTH DAILY (Patient taking differently: Chew 81 mg by mouth daily. ) 90 tablet 3  . atorvastatin (LIPITOR) 10 MG tablet Take 1 tablet (10 mg total) by mouth daily. Please call to schedule appointment for future refills. (Patient taking differently: Take 10 mg by mouth daily. ) 90 tablet 0  . calcitRIOL (ROCALTROL) 0.5 MCG capsule TAKE 1 CAPSULE BY MOUTH ON MONDAY, WEDNESDAY, AND FRIDAY WITH HEMODIALYSIS (Patient taking differently: Take 0.5 mcg by mouth See admin instructions. Take 1 capsule (0.5 mcg) by mouth every Mon, Wed, and Friday) 30 capsule 0  . carvedilol (COREG) 6.25 MG tablet TAKE ONE TABLET BY MOUTH TWICE A DAY WITH A MEAL (Patient taking differently: Take 6.25 mg by mouth 2 (two) times daily with a meal. ) 180 tablet 3  . clopidogrel (PLAVIX) 75 MG tablet  Take 1 tablet (75 mg total) by mouth daily. Please make overdue appt with Dr. Angelena Form before anymore refills. 1st attempt (Patient taking differently: Take 75 mg by mouth daily. ) 30 tablet 0  . docusate sodium (COLACE) 100 MG capsule Take 100 mg by mouth 2 (two) times daily.    . Fluticasone-Salmeterol (ADVAIR DISKUS IN) Inhale 1 puff into the lungs 2 (two) times daily.     Marland Kitchen guaiFENesin (MUCINEX) 600 MG 12 hr tablet Take 600 mg by mouth 2 (two) times daily.     . nitroGLYCERIN (NITROSTAT) 0.4 MG SL tablet Place 1 tablet (0.4 mg total) under the tongue every 5 (five) minutes x 3 doses as needed for chest pain. (Patient taking differently: Place 0.4 mg under the tongue every 5 (five) minutes as needed for chest pain (max 3 doses). ) 25 tablet 3  . predniSONE (DELTASONE) 20 MG tablet Take 20 mg by mouth daily with breakfast.    . Probiotic Product (PROBIOTIC ACIDOPHILUS BEADS PO) Take 1 capsule by mouth 2 (two) times daily.    . sertraline (ZOLOFT) 100 MG tablet TAKE ONE TABLET BY MOUTH DAILY (Patient taking differently: Take 100 mg by mouth daily. ) 90 tablet 1  . sevelamer carbonate (RENVELA) 800 MG tablet Take 1 tablet (800 mg total) by mouth 3 (three) times daily with meals. (Patient taking differently: Take 2,400 mg by mouth 3 (three) times daily with meals. ) 90 tablet 1  . traZODone (DESYREL) 50 MG tablet TAKE ONE TABLET BY MOUTH EVERY NIGHT AT BEDTIME (Patient taking differently: Take 50 mg by mouth at bedtime. ) 90 tablet 0  . umeclidinium bromide (INCRUSE ELLIPTA) 62.5 MCG/INH AEPB Inhale 1 puff into the lungs daily.     . B Complex-C-Folic Acid (RENA-VITE RX) 1 MG TABS TAKE ONE TABLET BY MOUTH AT BEDTIME (Patient not taking: Reported on 11/30/2019) 90 tablet 1  . fluticasone (FLONASE) 50 MCG/ACT nasal spray Place 1-2 sprays into both nostrils daily. (Patient not taking: Reported on 11/30/2019) 16 g 3  . glucose blood (ONETOUCH VERIO) test strip 1 each by Other route as needed for other. Use  as instructed 1-2 times per day. 100 each 2  . Lancets (ONETOUCH DELICA PLUS [id number]) MISC USE ONELANCET TO TEST - FOUR TIMES A DAY 100 each 1  . STOOL SOFTENER 100 MG capsule TAKE ONE CAPSULE BY MOUTH TWICE A DAY (Patient not taking: Reported on 11/30/2019) 90 capsule 3    Objective:  BP (!) 156/76   Pulse (!) 104   Temp 98.7 F (37.1 C) (Oral)   Resp (!) 26   Ht 5\' 5"  (1.651 m)   Wt 56 kg   SpO2 98%   BMI 20.54 kg/m  Exam: General: Patient laying in bed, mild to moderate respiratory distress.  Eyes: right pupil constricted and mildly reactive to light. Left pupil 52mm and reactive. EOMI.  Neck: supple neck without evidence of lymphadenopathy  Cardiovascular: tachycardic, no murmurs or gallops auscultated  Respiratory: diffuse rhonchi noted, overall decrease air movement noted along all lung fields, with increased work of breathing noted with belly breathing on 20L HFNC Gastrointestinal: soft, nontender, nondistended, presence of active bowel sounds Derm: skin warm and dry to touch, no rashes or lesions noted Ext: radial and dorsalis pedis pulses strong and equal bilaterally, no LE edema noted, no calf tenderness bilaterally   Neuro: Alert and follows commands appropriately  Psych: mood appropriate  Labs and Imaging: CBC BMET  Recent Labs  Lab 11/30/19 1138 11/30/19 1138 11/30/19 1209  WBC 23.7*  --   --   HGB 11.4*   < > 12.6*  HCT 36.1*   < > 37.0*  PLT 396  --   --    < > = values in this interval not displayed.   Recent Labs  Lab 11/30/19 1350  NA 128*  K 4.5  CL 81*  CO2 28  BUN 86*  CREATININE 8.16*  GLUCOSE 330*  CALCIUM 9.5     EKG: sinus tachycardia, ST elevations notable in V3 lead  DG Chest Portable 1 View  Result Date: 11/30/2019 CLINICAL DATA:  Shortness of breath EXAM: PORTABLE CHEST 1 VIEW COMPARISON:  03/19/2019 FINDINGS: The heart size and mediastinal contours are within normal limits. Dense airspace consolidation within the medial aspect of  the right lung base compatible with pneumonia. Left lung is clear. No pleural effusion or pneumothorax. The visualized skeletal structures are unremarkable. IMPRESSION: Right lower lobe pneumonia. Recommend radiographic follow-up to resolution. Electronically Signed   By: Davina Poke D.O.   On: 11/30/2019 12:03    Donney Dice, DO 11/30/2019, 3:21 PM PGY-1, [place] Intern pager: [phone], text pages welcome  Resident Addendum I have separately seen and examined the patient.  I have discussed the findings and exam with the resident and agree with the above note.  I helped develop the management plan that is described in the resident's note and I agree with the content.  Changes have been made in BLUE.    Addison Naegeli, MD PGY-3 [facility] residency program

## 2019-11-30 NOTE — Progress Notes (Addendum)
FPTS Interim Progress Note  S:Dr Paige and I reviewed patient again at 11pm. Was was laying in bed with increased WOB compared to before. Able to mouth a few words to [place] but no more than this. Saying he wanted to get off the bed and use the rest room.  O: BP 129/68   Pulse 87   Temp 98.7 F (37.1 C) (Oral)   Resp (!) 22   Ht 5\' 5"  (1.651 m)   Wt 56 kg   SpO2 98%   BMI 20.54 kg/m    General: Alert, frail appearing 54 yr old male, moderate distress, HFNC Cardio: Normal S1 and S2, tachycardia, no murmurs or rubs.   Pulm: diffuse crackles, wheeze and poor air entry. Increased WOB with subcostal retractions, coughing Extremities: No peripheral edema. Neuro: Cranial nerves grossly intact  A/P: On our second examination this evening pt is clinically worse. Although he is now only requiring 5L HFNC with sats >95%  (improvement from 20L HFNC earlier in the day, he does have increased WOB with accessory muscle use and worsening pulm exam. I spoke with Dr Milas Hock Attending regarding his management.  He recommended: -CCM consult  -Monitor oxygen requirement closely  -Discontinuing Azithryomcyin (already received one dose) due to prolonged QTc 554ms and covering for atypical organisms tomorrow by adding doxycycline  - IV solumedrol 150mg  IV Q6H, covering for possible exacerbation of COPD  -Follow up VQ scan -Follow up BNP, echo  -Trend troponins    Lattie Haw, MD 11/30/2019, 11:40 PM PGY-2, [place] Service pager [phone]

## 2019-11-30 NOTE — Progress Notes (Signed)
RT titrated HHFNC to 15L/80%. Patient is tolerating settings well. RT will continue to monitor patient as needed.

## 2019-11-30 NOTE — Progress Notes (Addendum)
FPTS Interim Progress Note  S: Patient resting in bed on HHFNC 15L/80 when Dr. Posey Pronto and I examined him. Patient not able to give history because he was unable to speak beyond giving [place] one word answers. Stated he lost his voice 2 days ago. He stated that dizziness is what brought him into the hospital initially. Denis current dizziness, cough, chest pain. States he is hungry and denies any other complaints.   While in the room we turned down patien'ts oxygen from 15L to 10L and he was satting around 97-100%. Patient was attempting to sit up at the end of the bed and subsequently desatted to 80% and when we had patient take some deep breaths and stay on the bed his O2 sat went back up. Notified RT who saw him again and will be weaning patient down as tolerated. We will also see patient again tonight to check on his breathing and oxygen requirement.   O: BP 139/73    Pulse 99    Temp 98.7 F (37.1 C) (Oral)    Resp (!) 22    Ht 5\' 5"  (1.651 m)    Wt 56 kg    SpO2 96%    BMI 20.54 kg/m   General: alert, NAD Cardiac: Tachycardic. No murmurs Respiratory: course breath sounds diffusely and decreased air movement in all fields. Productive cough on exam  GI: Abdomen soft, non distended  Extremities: warm, dry. No edema. Distal pulses 2+   A/P: Acute hypoxic respiratory failure Likely secondary to pneumonia and being volume overloaded due to ESRD. - Patient was supposed to get HD today but was unable to because of increased dyspnea. Will go tomorrow morning  - V/Q scan ordered tonight - F/u BMP - F/u echo - continue to wean O2 as tolerated  - Continue plans per day team's note     Volume overload and pneumonia   Shary Key, DO 11/30/2019, 9:44 PM PGY-1, [place] Medicine Service pager [phone]

## 2019-11-30 NOTE — Progress Notes (Signed)
RT removed heated HFNC and placed patient on 10L [place] HFNC. Patient tolerating well at this time. Heated HFNC is at bedside if needed. RN is aware. RT will monitor as needed.

## 2019-11-30 NOTE — ED Notes (Signed)
Pt refusing to use bedpan and requesting to be ambulated to pt restroom alth

## 2019-11-30 NOTE — ED Triage Notes (Addendum)
Pt here via ems from [facility] with reports of oxygen saturations of 55% on room air. Pt recently dx with PNA. Pt with accessory muscle use on arrival. Simple mask at 10L with oxygen saturations 90%. Pt given 5mg  albuterol, 1 duoneb, 125mg  solumedrol en route. Pt due for dialysis today.

## 2019-12-01 ENCOUNTER — Encounter (HOSPITAL_COMMUNITY): Payer: Self-pay | Admitting: Family Medicine

## 2019-12-01 ENCOUNTER — Inpatient Hospital Stay (HOSPITAL_COMMUNITY): Payer: BC Managed Care – PPO

## 2019-12-01 DIAGNOSIS — J9612 Chronic respiratory failure with hypercapnia: Secondary | ICD-10-CM | POA: Diagnosis present

## 2019-12-01 DIAGNOSIS — J9601 Acute respiratory failure with hypoxia: Secondary | ICD-10-CM | POA: Diagnosis not present

## 2019-12-01 DIAGNOSIS — E785 Hyperlipidemia, unspecified: Secondary | ICD-10-CM | POA: Diagnosis present

## 2019-12-01 DIAGNOSIS — Z992 Dependence on renal dialysis: Secondary | ICD-10-CM

## 2019-12-01 DIAGNOSIS — E889 Metabolic disorder, unspecified: Secondary | ICD-10-CM | POA: Diagnosis present

## 2019-12-01 DIAGNOSIS — F05 Delirium due to known physiological condition: Secondary | ICD-10-CM | POA: Diagnosis present

## 2019-12-01 DIAGNOSIS — E1169 Type 2 diabetes mellitus with other specified complication: Secondary | ICD-10-CM | POA: Diagnosis present

## 2019-12-01 DIAGNOSIS — E1121 Type 2 diabetes mellitus with diabetic nephropathy: Secondary | ICD-10-CM

## 2019-12-01 DIAGNOSIS — M898X9 Other specified disorders of bone, unspecified site: Secondary | ICD-10-CM | POA: Diagnosis present

## 2019-12-01 DIAGNOSIS — J189 Pneumonia, unspecified organism: Secondary | ICD-10-CM

## 2019-12-01 DIAGNOSIS — N186 End stage renal disease: Secondary | ICD-10-CM

## 2019-12-01 DIAGNOSIS — Z593 Problems related to living in residential institution: Secondary | ICD-10-CM

## 2019-12-01 LAB — COMPREHENSIVE METABOLIC PANEL
ALT: 19 U/L (ref 0–44)
AST: 21 U/L (ref 15–41)
Albumin: 2.6 g/dL — ABNORMAL LOW (ref 3.5–5.0)
Alkaline Phosphatase: 75 U/L (ref 38–126)
Anion gap: 20 — ABNORMAL HIGH (ref 5–15)
BUN: 105 mg/dL — ABNORMAL HIGH (ref 6–20)
CO2: 25 mmol/L (ref 22–32)
Calcium: 9.2 mg/dL (ref 8.9–10.3)
Chloride: 84 mmol/L — ABNORMAL LOW (ref 98–111)
Creatinine, Ser: 8.98 mg/dL — ABNORMAL HIGH (ref 0.61–1.24)
GFR, Estimated: 6 mL/min — ABNORMAL LOW (ref >60)
Glucose, Bld: 280 mg/dL — ABNORMAL HIGH (ref 70–99)
Potassium: 4.5 mmol/L (ref 3.5–5.1)
Sodium: 129 mmol/L — ABNORMAL LOW (ref 135–145)
Total Bilirubin: 1.3 mg/dL — ABNORMAL HIGH (ref 0.3–1.2)
Total Protein: 6.9 g/dL (ref 6.5–8.1)

## 2019-12-01 LAB — ECHOCARDIOGRAM COMPLETE
AR max vel: 3.11 cm2
AV Area VTI: 2.67 cm2
AV Area mean vel: 3.17 cm2
AV Mean grad: 3 mmHg
AV Peak grad: 6.6 mmHg
Ao pk vel: 1.28 m/s
Area-P 1/2: 2.71 cm2
Height: 65 in
S' Lateral: 2.9 cm
Weight: 1844.81 oz

## 2019-12-01 LAB — GLUCOSE, CAPILLARY
Glucose-Capillary: 179 mg/dL — ABNORMAL HIGH (ref 70–99)
Glucose-Capillary: 192 mg/dL — ABNORMAL HIGH (ref 70–99)
Glucose-Capillary: 180 mg/dL — ABNORMAL HIGH (ref 70–99)

## 2019-12-01 LAB — I-STAT ARTERIAL BLOOD GAS, ED
Acid-Base Excess: 5 mmol/L — ABNORMAL HIGH (ref 0.0–2.0)
Bicarbonate: 31.1 mmol/L — ABNORMAL HIGH (ref 20.0–28.0)
Calcium, Ion: 1.15 mmol/L (ref 1.15–1.40)
HCT: 37 % — ABNORMAL LOW (ref 39.0–52.0)
Hemoglobin: 12.6 g/dL — ABNORMAL LOW (ref 13.0–17.0)
O2 Saturation: 95 %
Patient temperature: 98.6
Potassium: 4.1 mmol/L (ref 3.5–5.1)
Sodium: 126 mmol/L — ABNORMAL LOW (ref 135–145)
TCO2: 33 mmol/L — ABNORMAL HIGH (ref 22–32)
pCO2 arterial: 50 mmHg — ABNORMAL HIGH (ref 32.0–48.0)
pH, Arterial: 7.402 (ref 7.350–7.450)
pO2, Arterial: 75 mmHg — ABNORMAL LOW (ref 83.0–108.0)

## 2019-12-01 LAB — HEPATITIS B CORE ANTIBODY, TOTAL: Hep B Core Total Ab: NONREACTIVE

## 2019-12-01 LAB — CBC
HCT: 29.7 % — ABNORMAL LOW (ref 39.0–52.0)
Hemoglobin: 9.5 g/dL — ABNORMAL LOW (ref 13.0–17.0)
MCH: 32.1 pg (ref 26.0–34.0)
MCHC: 32 g/dL (ref 30.0–36.0)
MCV: 100.3 fL — ABNORMAL HIGH (ref 80.0–100.0)
Platelets: 298 10*3/uL (ref 150–400)
RBC: 2.96 MIL/uL — ABNORMAL LOW (ref 4.22–5.81)
RDW: 13.6 % (ref 11.5–15.5)
WBC: 21 10*3/uL — ABNORMAL HIGH (ref 4.0–10.5)
nRBC: 0 % (ref 0.0–0.2)

## 2019-12-01 LAB — HEMOGLOBIN AND HEMATOCRIT, BLOOD
HCT: 31.4 % — ABNORMAL LOW (ref 39.0–52.0)
Hemoglobin: 10.1 g/dL — ABNORMAL LOW (ref 13.0–17.0)

## 2019-12-01 LAB — BRAIN NATRIURETIC PEPTIDE: B Natriuretic Peptide: 230.9 pg/mL — ABNORMAL HIGH (ref 0.0–100.0)

## 2019-12-01 LAB — MRSA PCR SCREENING: MRSA by PCR: NEGATIVE

## 2019-12-01 LAB — PHOSPHORUS: Phosphorus: 6.6 mg/dL — ABNORMAL HIGH (ref 2.5–4.6)

## 2019-12-01 LAB — TROPONIN I (HIGH SENSITIVITY)
Troponin I (High Sensitivity): 221 ng/L (ref <18)
Troponin I (High Sensitivity): 186 ng/L (ref <18)

## 2019-12-01 LAB — HEPATITIS B SURFACE ANTIGEN: Hepatitis B Surface Ag: NONREACTIVE

## 2019-12-01 LAB — MAGNESIUM: Magnesium: 2.5 mg/dL — ABNORMAL HIGH (ref 1.7–2.4)

## 2019-12-01 MED ORDER — METHYLPREDNISOLONE SODIUM SUCC 125 MG IJ SOLR
60.0000 mg | Freq: Every day | INTRAMUSCULAR | Status: DC
  Administered 2019-12-02: 60 mg via INTRAVENOUS
  Filled 2019-12-01: qty 2

## 2019-12-01 MED ORDER — SODIUM CHLORIDE 0.9 % IV SOLN
100.0000 mg | Freq: Two times a day (BID) | INTRAVENOUS | Status: DC
  Administered 2019-12-01 – 2019-12-03 (×5): 100 mg via INTRAVENOUS
  Filled 2019-12-01 (×6): qty 100

## 2019-12-01 MED ORDER — DOXYCYCLINE HYCLATE 100 MG PO TABS
100.0000 mg | ORAL_TABLET | Freq: Two times a day (BID) | ORAL | Status: DC
  Filled 2019-12-01: qty 1

## 2019-12-01 MED ORDER — SODIUM CHLORIDE 0.9 % IV SOLN
2.0000 g | INTRAVENOUS | Status: DC
  Filled 2019-12-01: qty 20

## 2019-12-01 MED ORDER — IPRATROPIUM-ALBUTEROL 0.5-2.5 (3) MG/3ML IN SOLN
3.0000 mL | RESPIRATORY_TRACT | Status: DC
  Administered 2019-12-01 (×4): 3 mL via RESPIRATORY_TRACT
  Filled 2019-12-01 (×3): qty 3

## 2019-12-01 MED ORDER — VANCOMYCIN HCL IN DEXTROSE 500-5 MG/100ML-% IV SOLN
INTRAVENOUS | Status: AC
  Filled 2019-12-01: qty 100

## 2019-12-01 MED ORDER — VANCOMYCIN HCL IN DEXTROSE 500-5 MG/100ML-% IV SOLN: 500.0000 mg | Freq: Once | INTRAVENOUS | Status: DC

## 2019-12-01 MED ORDER — PREDNISONE 50 MG PO TABS: 60.0000 mg | ORAL_TABLET | Freq: Every day | ORAL | Status: DC

## 2019-12-01 MED ORDER — VANCOMYCIN HCL IN DEXTROSE 500-5 MG/100ML-% IV SOLN
500.0000 mg | Freq: Once | INTRAVENOUS | Status: AC
  Administered 2019-12-01: 500 mg via INTRAVENOUS
  Filled 2019-12-01: qty 100

## 2019-12-01 MED ORDER — INSULIN DETEMIR 100 UNIT/ML ~~LOC~~ SOLN
10.0000 [IU] | Freq: Every day | SUBCUTANEOUS | Status: DC
  Administered 2019-12-01 – 2019-12-02 (×2): 10 [IU] via SUBCUTANEOUS
  Filled 2019-12-01 (×3): qty 0.1

## 2019-12-01 MED ORDER — METOPROLOL TARTRATE 5 MG/5ML IV SOLN
2.5000 mg | Freq: Two times a day (BID) | INTRAVENOUS | Status: DC
  Administered 2019-12-01 – 2019-12-02 (×2): 2.5 mg via INTRAVENOUS
  Filled 2019-12-01 (×2): qty 5

## 2019-12-01 MED ORDER — IOHEXOL 350 MG/ML SOLN
100.0000 mL | Freq: Once | INTRAVENOUS | Status: AC | PRN
  Administered 2019-12-01: 58 mL via INTRAVENOUS

## 2019-12-01 MED ORDER — SODIUM CHLORIDE 0.9 % IV SOLN
1.0000 g | INTRAVENOUS | Status: DC
  Administered 2019-12-01 – 2019-12-02 (×2): 1 g via INTRAVENOUS
  Filled 2019-12-01 (×3): qty 1

## 2019-12-01 MED ORDER — SODIUM CHLORIDE 0.9 % IV SOLN
500.0000 mg | INTRAVENOUS | Status: DC
  Filled 2019-12-01: qty 500

## 2019-12-01 NOTE — Progress Notes (Signed)
FPTS Interim Progress Note  S: Reviewed pt this morning after he came to the floor. He was alert and looking more comfortable compared to before. Denies concerns.   O: BP (!) 143/69 (BP Location: Right Arm)   Pulse 85   Temp 98.4 F (36.9 C) (Oral)   Resp 20   Ht 5\' 5"  (1.651 m)   Wt 56 kg   SpO2 100%   BMI 20.54 kg/m    General: Alert, frail appearing 54 yr old male, mild distress Cardio: Normal S1 and S2, RRR Pulm: bilateral crackles and wheeze throughout lung fields, mild respiratory distress Extremities: No peripheral edema.  Neuro: Cranial nerves grossly intact  A/P: Plan per previous note. Seen by CCM overnight, ABG shows chronic co2 retention. Not for ICU right now.   Lattie Haw, MD 12/01/2019, 6:21 AM PGY-2, [place] Medicine Service pager [phone]

## 2019-12-01 NOTE — Progress Notes (Signed)
   12/01/19 1130  Vitals  Temp 98.9 F (37.2 C)  Temp Source Oral  BP 122/70  BP Location Right Arm  BP Method Automatic  Patient Position (if appropriate) Lying  Pulse Rate 91  Pulse Rate Source Monitor  ECG Heart Rate 100  Resp 20  Oxygen Therapy  SpO2 98 %  O2 Device Nasal Cannula  Heater temperature 39.2 F (4 C)  Dialysis Weight  Weight 52.3 kg  Type of Weight Post-Dialysis  During Hemodialysis Assessment  Intra-Hemodialysis Comments Tx completed  Post-Hemodialysis Assessment  Rinseback Volume (mL) 250 mL  KECN 270 V  Dialyzer Clearance Lightly streaked  Duration of HD Treatment -hour(s) 4 hour(s)  Hemodialysis Intake (mL) 500 mL  UF Total -Machine (mL) 3000 mL  Net UF (mL) 2500 mL  Tolerated HD Treatment Yes  Post-Hemodialysis Comments tx complete-pt stable  AVG/AVF Arterial Site Held (minutes) 10 minutes  AVG/AVF Venous Site Held (minutes) 10 minutes  Fistula / Graft Left Upper arm Arteriovenous vein graft  Placement Date/Time: 06/28/17 1503   Placed prior to admission: No  Orientation: Left  Access Location: Upper arm  Access Type: Arteriovenous vein graft  Site Condition No complications  Fistula / Graft Assessment Present;Thrill;Bruit  Status Deaccessed  Drainage Description None  HD tx complete-pt stable.

## 2019-12-01 NOTE — Evaluation (Signed)
Clinical/Bedside Swallow Evaluation Patient Details  Name: Jeffery Young MRN: [id number] Date of Birth: Dec 22, 1965  Today's Date: 12/01/2019 Time: SLP Start Time (ACUTE ONLY): 1638 SLP Stop Time (ACUTE ONLY): 1417 SLP Time Calculation (min) (ACUTE ONLY): 22 min  Past Medical History:  Past Medical History:  Diagnosis Date  . Anemia of renal disease 05/28/2017  . Chest pain 01/10/2016  . Chronic combined systolic and diastolic heart failure ([place])   . Coronary artery disease    a.12/22/15: NSTEMI s/p overlapping DES x2 and balloon angioplasty to distal AV groove Circumflex (too small for a stent).   . Coronary artery disease due to lipid rich plaque 01/10/2016  . Dehydration 08/22/2012  . Depression   . Elevated troponin   . ESRD (end stage renal disease) on dialysis ([place])   . Essential hypertension   . History of completed stroke 05/28/2017  . Hypercholesteremia   . Hypertension   . Hypertensive heart disease with heart failure ([place])   . Ischemic cardiomyopathy 03/13/2016  . Normocytic anemia 05/28/2017  . NSTEMI (non-ST elevated myocardial infarction) ([place]) 12/22/2015  . Prostate infection   . Status post coronary artery stent placement   . Syncope 03/13/2016  . Tobacco abuse   . Type 2 diabetes mellitus with diabetic nephropathy, without long-term current use of insulin ([place])   . Type II diabetes mellitus ([place])   . Uncontrolled hypertension 05/28/2017  . Unstable angina pectoris ([place]) 01/11/2016   Past Surgical History:  Past Surgical History:  Procedure Laterality Date  . BASCILIC VEIN TRANSPOSITION Left 06/28/2017   Procedure: LEFT UPPER ARM ARTERIOVENOUS GORTEX-GRAFT PLACEMENT;  Surgeon: Angelia Mould, MD;  Location: [place];  Service: Vascular;  Laterality: Left;  . CARDIAC CATHETERIZATION N/A 12/22/2015   Procedure: Left Heart Cath and Coronary Angiography;  Surgeon: Burnell Blanks, MD;  Location: [place] [facility];  Service: Cardiovascular;  Laterality: N/A;  .  CARDIAC CATHETERIZATION N/A 12/22/2015   Procedure: Coronary Stent Intervention;  Surgeon: Burnell Blanks, MD;  Location: [place] [facility];  Service: Cardiovascular;  Laterality: N/A;  . CARDIAC CATHETERIZATION N/A 01/11/2016   Procedure: Left Heart Cath and Coronary Angiography;  Surgeon: Sherren Mocha, MD;  Location: [place] [facility] LAB;  Service: Cardiovascular;  Laterality: N/A;  . CARDIAC CATHETERIZATION N/A 01/11/2016   Procedure: Intravascular Pressure Wire/FFR Study;  Surgeon: Sherren Mocha, MD;  Location: [place] [facility];  Service: Cardiovascular;  Laterality: N/A;  . CARDIAC CATHETERIZATION N/A 01/11/2016   Procedure: Coronary Stent Intervention;  Surgeon: Sherren Mocha, MD;  Location: [place] [facility];  Service: Cardiovascular;  Laterality: N/A;  . CORONARY ANGIOPLASTY WITH STENT PLACEMENT  12/22/2015  . INSERTION OF DIALYSIS CATHETER Right 06/28/2017   Procedure: INSERTION OF DIALYSIS CATHETER RIGHT INTERNAL JUGULAR;  Surgeon: Angelia Mould, MD;  Location[facility];  Service: Vascular;  Laterality: Right;   HPI:  54 yr old admitted with worsening dyspnea, afebrile and productive cough from [facility], Per chart O2 sat noted to be 55% on room air. COVID negative. Found to have CAP. PMH: basal ganglia/pons stroke 2019, ESRD, HTN, DM, NSTEMI, CAD. CXR RRL pna. Outpatient MBS 10/08/19 >pt reported hx of dysphagia in 2019. Found to have delayed pharyngeal swallow with penetration (PAS 3) of large boluses. Reg/thin recommended. Pt admitted to fast rate of eating.    Assessment / Plan / Recommendation Clinical Impression  Pt demonstating significant indicators for pharyngeal dysphagia marked by poor saliva management, ineffective cough, wet vocal quality with  dysphonia and aphonia intermittently. He was seen by ST for outpatient MBS 10/08/19 finding instance of penetration (no aspiration) with large sip thin. Prior to po trial today he had intermittent hiccuping.  SLP cleaned and applied to loose upper denture plate that was not adhesive but did want want SLP to remove. Immediate, consistent and explosive coughs after water. Multiple swallows after puree. Swallow presentation resembles a medullary stroke from clinical observation. Recommend MBS tomorrow and continue NPO (can have several ice chips after oral care).    SLP Visit Diagnosis: Dysphagia, unspecified (R13.10)    Aspiration Risk  Moderate aspiration risk;Severe aspiration risk    Diet Recommendation NPO   Medication Administration: Via alternative means (IV)    Other  Recommendations Oral Care Recommendations: Oral care QID   Follow up Recommendations Skilled Nursing facility      Frequency and Duration            Prognosis        Swallow Study   General HPI: 54 yr old admitted with worsening dyspnea, afebrile and productive cough from [facility], Per chart O2 sat noted to be 55% on room air. COVID negative. Found to have CAP. PMH: basal ganglia/pons stroke 2019, ESRD, HTN, DM, NSTEMI, CAD. CXR RRL pna. Outpatient MBS 10/08/19 >pt reported hx of dysphagia in 2019. Found to have delayed pharyngeal swallow with penetration (PAS 3) of large boluses. Reg/thin recommended. Pt admitted to fast rate of eating.  Type of Study: Bedside Swallow Evaluation Previous Swallow Assessment:  (see HPI) Diet Prior to this Study: NPO Temperature Spikes Noted: No Respiratory Status: Nasal cannula History of Recent Intubation: No Behavior/Cognition: Alert;Requires cueing;Cooperative Oral Cavity Assessment: Other (comment) (lingual candidias) Oral Care Completed by SLP: No Oral Cavity - Dentition: Dentures, top;Dentures, bottom Vision: Functional for self-feeding Self-Feeding Abilities: Able to feed self Patient Positioning: Upright in bed Baseline Vocal Quality: Low vocal intensity;Other (comment);Wet (severe dysphonia almost aphonic) Volitional Cough: Weak;Other (Comment) (not  effective) Volitional Swallow: Able to elicit    Oral/Motor/Sensory Function Overall Oral Motor/Sensory Function: Within functional limits   Ice Chips Ice chips: Not tested   Thin Liquid Thin Liquid: Impaired Presentation: Cup;Self Fed Pharyngeal  Phase Impairments: Cough - Immediate;Cough - Delayed    Nectar Thick Nectar Thick Liquid: Not tested   Honey Thick Honey Thick Liquid: Not tested   Puree Puree: Impaired Pharyngeal Phase Impairments: Throat Clearing - Delayed   Solid     Solid: Not tested      Houston Siren 12/01/2019,2:54 PM  Orbie Pyo [place].Ed Risk analyst [phone] Office [phone]

## 2019-12-01 NOTE — Consult Note (Signed)
NAME:  Jeffery Young, MRN:  [id number], DOB:  1965-12-19, LOS: 1 ADMISSION DATE:  11/30/2019, CONSULTATION DATE: 12/01/2019 REFERRING MD: Dr. Posey Pronto, CHIEF COMPLAINT: Worsening dyspnea  Brief History   54 year old male with a history of COPD none FEV1 1.23 and a right lower lobe pneumonia just medial to the right heart border.  History of present illness   Patient is a 54 year old who began having increased dyspnea about 2 days ago.  He presented to the emergency room earlier on the 25th planes of cough scantly productive of phlegm no hemoptysis and increased exertional dyspnea.  He tells me normally he could walk about 2 city blocks without significant dyspnea although such as not been the case for the last 2 days. Pulmonary function tests 12/18/2027 show FEV1 of 1.23 or 41% of predicted with an FVC of 51 or 1.96 L.  Arterial blood gas obtained about 12 hours ago shows a pH of 7.487 PCO2 of 44 PO2 of 93 O2 saturation 98%.  Blood glucose is 330.  He is a chronic dialysis patient, MWF, with creatinine of 8.16 with a baseline of about 6.  Lactic acid of 1.2 with WBC of 23.7.    Patient is on 5 l [place] with sats of 98%, comfortable appearing.  Repeat ABG shows pH 7.4 with PCO2 of 50 PO2 of 75 O2 saturation 98%.  The age and PCO2 indicate chronic CO2 retainer.  No previous ABG available for comparison.  Past Medical History   . Chest pain 01/10/2016  . Chronic combined systolic and diastolic heart failure ([place])   . Coronary artery disease    a.12/22/15: NSTEMI s/p overlapping DES x2 and balloon angioplasty to distal AV groove Circumflex (too small for a stent).   . Coronary artery disease due to lipid rich plaque 01/10/2016  . Dehydration 08/22/2012  . Depression   . Elevated troponin   . ESRD (end stage renal disease) on dialysis ([place])   . Essential hypertension   . History of completed stroke 05/28/2017  . Hypercholesteremia   . Hypertension   . Hypertensive heart disease with heart  failure ([place])   . Ischemic cardiomyopathy 03/13/2016  . Normocytic anemia 05/28/2017  . NSTEMI (non-ST elevated myocardial infarction) ([place]) 12/22/2015  . Prostate infection   . Status post coronary artery stent placement   . Syncope 03/13/2016  . Tobacco abuse   . Type 2 diabetes mellitus with diabetic nephropathy, without long-term current use of insulin ([place])   . Type II diabetes mellitus ([place])   . Uncontrolled hypertension 05/28/2017  . Unstable angina pectoris ([place]) 01/11/2016     Significant Hospital Events   NA  Consults:  Nephrology, family practice  Procedures:  NA  Significant Diagnostic Tests:  As per HPI  Micro Data:  Blood pending, history or prior klebsiella PNA sensitive to cephalosporins.  Antimicrobials:  Started on cefepime and vanco  Interim history/subjective:  NA  Objective   Blood pressure 129/68, pulse 87, temperature 98.7 F (37.1 C), temperature source Oral, resp. rate (!) 22, height 5\' 5"  (1.651 m), weight 56 kg, SpO2 98 %.    FiO2 (%):  [80 %-100 %] 80 %   Intake/Output Summary (Last 24 hours) at 12/01/2019 0040 Last data filed at 11/30/2019 2301 Gross per 24 hour  Intake 250 ml  Output --  Net 250 ml   Filed Weights   11/30/19 1205  Weight: 56 kg    Examination: General: Pleasant male in no acute distress HENT: Wnl Lungs: Bilateral wheezing  Cardiovascular: Regular rate and rhythm Abdomen: Benign Extremities: Within normal limits Neuro: Awake alert nonfocal GU: N/A  Resolved Hospital Problem list   NA  Assessment & Plan:  1.  Right lower lobe pneumonia: Agree with cefepime based on previous Klebsiella pneumonia, started on vancomycin.  Would consider the addition of Zithromax to current regimen to cover for atypical organisms.  2.  Chronic renal failure on dialysis: Nephrology consulted they will arrange.  3.  History of congestive heart failure: Monitor fluid status closely.  4.  COPD: Patient with bilateral  wheezing.  FEV1 is 1.23 L which would put him in the mild to moderate category.  Is wheezing profusely.  With his hyperglycemia steroids will necessitate close glucose monitoring.  Would start duo nebs every 4 hours while awake.  5.  Hyperglycemia: On sliding scale insulin  Patient is not in acute distress.  He does not need ICU monitoring.  ABG reflects chronic PCO2 retention.  Follow on the floor and consultation with family practice.  Best practice:  Diet: Renal diet Pain/Anxiety/Delirium protocol (if indicated): N/A VAP protocol (if indicated): N/A DVT prophylaxis: Lovenox GI prophylaxis: Pepcid Glucose control: We will need monitoring with supplemental insulin as necessary Mobility: Bedrest Code Status: Full Family Communication: Discussed with patient Disposition: We will repeat ABG.  If not acidotic by ABG with adequate oxygen saturation on 5 L nasal cannula patient could be admitted to the floor.  Labs   CBC: Recent Labs  Lab 11/30/19 1138 11/30/19 1209  WBC 23.7*  --   HGB 11.4* 12.6*  HCT 36.1* 37.0*  MCV 98.9  --   PLT 396  --     Basic Metabolic Panel: Recent Labs  Lab 11/30/19 1209 11/30/19 1350  NA 125* 128*  K 4.4 4.5  CL  --  81*  CO2  --  28  GLUCOSE  --  330*  BUN  --  86*  CREATININE  --  8.16*  CALCIUM  --  9.5   GFR: Estimated Creatinine Clearance: 8.3 mL/min (A) (by C-G formula based on SCr of 8.16 mg/dL (H)). Recent Labs  Lab 11/30/19 1138 11/30/19 1350  WBC 23.7*  --   LATICACIDVEN 1.4 1.2    Liver Function Tests: Recent Labs  Lab 11/30/19 1350  AST 20  ALT 21  ALKPHOS 81  BILITOT 1.3*  PROT 7.9  ALBUMIN 2.9*   No results for input(s): LIPASE, AMYLASE in the last 168 hours. No results for input(s): AMMONIA in the last 168 hours.  ABG    Component Value Date/Time   HCO3 33.5 (H) 11/30/2019 1209   TCO2 35 (H) 11/30/2019 1209   O2SAT 98.0 11/30/2019 1209     Coagulation Profile: No results for input(s): INR, PROTIME in  the last 168 hours.  Cardiac Enzymes: No results for input(s): CKTOTAL, CKMB, CKMBINDEX, TROPONINI in the last 168 hours.  HbA1C: Hgb A1c MFr Bld  Date/Time Value Ref Range Status  03/19/2019 03:24 PM 7.3 (H) 4.8 - 5.6 % Final    Comment:             Prediabetes: 5.7 - 6.4          Diabetes: >6.4          Glycemic control for adults with diabetes: <7.0   10/16/2018 03:00 PM 6.4 (H) 4.8 - 5.6 % Final    Comment:             Prediabetes: 5.7 - 6.4  Diabetes: >6.4          Glycemic control for adults with diabetes: <7.0     CBG: Recent Labs  Lab 11/30/19 1741  GLUCAP 347*    Review of Systems:   Patient with difficulty with hoarseness, unable to obtain full ROS  Past Medical History  He,  has a past medical history of Chest pain (01/10/2016), Chronic combined systolic and diastolic heart failure ([place]), Coronary artery disease, Coronary artery disease due to lipid rich plaque (01/10/2016), Dehydration (08/22/2012), Depression, Elevated troponin, ESRD (end stage renal disease) on dialysis [facility]), Essential hypertension, History of completed stroke (05/28/2017), Hypercholesteremia, Hypertension, Hypertensive heart disease with heart failure ([place]), Ischemic cardiomyopathy (03/13/2016), Normocytic anemia (05/28/2017), NSTEMI (non-ST elevated myocardial infarction) ([place]) (12/22/2015), Prostate infection, Status post coronary artery stent placement, Syncope (03/13/2016), Tobacco abuse, Type 2 diabetes mellitus with diabetic nephropathy, without long-term current use of insulin ([place]), Type II diabetes mellitus ([place]), Uncontrolled hypertension (05/28/2017), and Unstable angina pectoris ([place]) (01/11/2016).   Surgical History    Past Surgical History:  Procedure Laterality Date  . BASCILIC VEIN TRANSPOSITION Left 06/28/2017   Procedure: LEFT UPPER ARM ARTERIOVENOUS GORTEX-GRAFT PLACEMENT;  Surgeon: Angelia Mould, MD;  Location: [place];  Service: Vascular;  Laterality: Left;  . CARDIAC  CATHETERIZATION N/A 12/22/2015   Procedure: Left Heart Cath and Coronary Angiography;  Surgeon: Burnell Blanks, MD;  Location: [place] CV LAB;  Service: Cardiovascular;  Laterality: N/A;  . CARDIAC CATHETERIZATION N/A 12/22/2015   Procedure: Coronary Stent Intervention;  Surgeon: Burnell Blanks, MD;  Location: [place] [facility];  Service: Cardiovascular;  Laterality: N/A;  . CARDIAC CATHETERIZATION N/A 01/11/2016   Procedure: Left Heart Cath and Coronary Angiography;  Surgeon: Sherren Mocha, MD;  Location: [place] [facility];  Service: Cardiovascular;  Laterality: N/A;  . CARDIAC CATHETERIZATION N/A 01/11/2016   Procedure: Intravascular Pressure Wire/FFR Study;  Surgeon: Sherren Mocha, MD;  Location: [place] CV LAB;  Service: Cardiovascular;  Laterality: N/A;  . CARDIAC CATHETERIZATION N/A 01/11/2016   Procedure: Coronary Stent Intervention;  Surgeon: Sherren Mocha, MD;  Location: [place] CV LAB;  Service: Cardiovascular;  Laterality: N/A;  . CORONARY ANGIOPLASTY WITH STENT PLACEMENT  12/22/2015  . INSERTION OF DIALYSIS CATHETER Right 06/28/2017   Procedure: INSERTION OF DIALYSIS CATHETER RIGHT INTERNAL JUGULAR;  Surgeon: Angelia Mould, MD;  Location: [place];  Service: Vascular;  Laterality: Right;     Social History   reports that he quit smoking about 2 years ago. His smoking use included cigarettes. He has a 21.75 pack-year smoking history. He has never used smokeless tobacco. He reports previous alcohol use of about 7.0 standard drinks of alcohol per week. He reports that he does not use drugs.   Family History   His family history includes CAD in his brother; CVA in his brother; Chronic Renal Failure (age of onset: 67) in his brother; Diabetes in his mother; Healthy in his daughter and son; Hypertension in his mother; Rheum arthritis in his mother.   Allergies No Known Allergies   Home Medications  Prior to Admission medications   Medication Sig  Start Date End Date Taking? Authorizing Provider  albuterol (VENTOLIN HFA) 108 (90 Base) MCG/ACT inhaler Inhale 2 puffs into the lungs every 6 (six) hours as needed for wheezing or shortness of breath.   Yes [provider]  amoxicillin-clavulanate (AUGMENTIN) 500-125 MG tablet Take 1 tablet by mouth at bedtime.   Yes [provider]  aspirin 81 MG chewable  tablet CHEW ONE TABLET BY MOUTH DAILY Patient taking differently: Chew 81 mg by mouth daily.  04/22/19  Yes [name], MD  atorvastatin (LIPITOR) 10 MG tablet Take 1 tablet (10 mg total) by mouth daily. Please call to schedule appointment for future refills. Patient taking differently: Take 10 mg by mouth daily.  08/11/19  Yes Imogene Burn, PA-C  calcitRIOL (ROCALTROL) 0.5 MCG capsule TAKE 1 CAPSULE BY MOUTH ON MONDAY, WEDNESDAY, AND FRIDAY WITH HEMODIALYSIS Patient taking differently: Take 0.5 mcg by mouth See admin instructions. Take 1 capsule (0.5 mcg) by mouth every Mon, Wed, and Friday 03/17/18  Yes [name], MD  carvedilol (COREG) 6.25 MG tablet TAKE ONE TABLET BY MOUTH TWICE A DAY WITH A MEAL Patient taking differently: Take 6.25 mg by mouth 2 (two) times daily with a meal.  07/07/19  Yes Imogene Burn, PA-C  clopidogrel (PLAVIX) 75 MG tablet Take 1 tablet (75 mg total) by mouth daily. Please make overdue appt with Dr. Angelena Form before anymore refills. 1st attempt Patient taking differently: Take 75 mg by mouth daily.  10/13/19  Yes Burnell Blanks, MD  docusate sodium (COLACE) 100 MG capsule Take 100 mg by mouth 2 (two) times daily.   Yes [provider]  Fluticasone-Salmeterol (ADVAIR DISKUS IN) Inhale 1 puff into the lungs 2 (two) times daily.    Yes [provider]  guaiFENesin (MUCINEX) 600 MG 12 hr tablet Take 600 mg by mouth 2 (two) times daily.    Yes [provider]  nitroGLYCERIN (NITROSTAT) 0.4 MG SL tablet Place 1 tablet (0.4 mg total) under the tongue every 5  (five) minutes x 3 doses as needed for chest pain. Patient taking differently: Place 0.4 mg under the tongue every 5 (five) minutes as needed for chest pain (max 3 doses).  12/26/15  Yes Cheryln Manly, NP  predniSONE (DELTASONE) 20 MG tablet Take 20 mg by mouth daily with breakfast.   Yes [provider]  Probiotic Product (PROBIOTIC ACIDOPHILUS BEADS PO) Take 1 capsule by mouth 2 (two) times daily.   Yes [provider]  sertraline (ZOLOFT) 100 MG tablet TAKE ONE TABLET BY MOUTH DAILY Patient taking differently: Take 100 mg by mouth daily.  06/09/19  Yes [name], MD  sevelamer carbonate (RENVELA) 800 MG tablet Take 1 tablet (800 mg total) by mouth 3 (three) times daily with meals. Patient taking differently: Take 2,400 mg by mouth 3 (three) times daily with meals.  08/15/17  Yes [name], MD  traZODone (DESYREL) 50 MG tablet TAKE ONE TABLET BY MOUTH EVERY NIGHT AT BEDTIME Patient taking differently: Take 50 mg by mouth at bedtime.  11/07/19  Yes [name], MD  umeclidinium bromide (INCRUSE ELLIPTA) 62.5 MCG/INH AEPB Inhale 1 puff into the lungs daily.    Yes [provider]  B Complex-C-Folic Acid (RENA-VITE RX) 1 MG TABS TAKE ONE TABLET BY MOUTH AT BEDTIME Patient not taking: Reported on 11/30/2019 11/19/19   [name], MD  fluticasone [facility]) 50 MCG/ACT nasal spray Place 1-2 sprays into both nostrils daily. Patient not taking: Reported on 11/30/2019 03/05/19   [name], MD  glucose blood [facility] VERIO) test strip 1 each by Other route as needed for other. Use as instructed 1-2 times per day. 04/24/18   [name], MD  Lancets (ONETOUCH DELICA PLUS [id number]) [place] USE ONELANCET TO TEST - FOUR TIMES A DAY 08/29/18   [name], MD  STOOL SOFTENER 100 MG capsule TAKE ONE CAPSULE BY MOUTH TWICE A DAY Patient not taking: Reported on 11/30/2019 06/09/19   [name], MD     Critical care time: 35 minutes was  spent in bedside evaluation and critical care planning.

## 2019-12-01 NOTE — Progress Notes (Signed)
Inpatient Diabetes Program Recommendations  AACE/ADA: New Consensus Statement on Inpatient Glycemic Control (2015)  Target Ranges:  Prepandial:   less than 140 mg/dL      Peak postprandial:   less than 180 mg/dL (1-2 hours)      Critically ill patients:  140 - 180 mg/dL   Lab Results  Component Value Date   GLUCAP 347 (H) 11/30/2019   HGBA1C 7.3 (H) 03/19/2019    Review of Glycemic Control Results for Jeffery Young, Jeffery Young (MRN [id number]) as of 12/01/2019 09:50  Ref. Range 11/30/2019 17:41  Glucose-Capillary Latest Ref Range: 70 - 99 mg/dL 347 (H)   Diabetes history: Type 2 DM Outpatient Diabetes medications: none Current orders for Inpatient glycemic control: Novolog 0-9 units TID Prednisone 20 mg QD (started on 10/20) Solumedrol 125 mg Q6H  Inpatient Diabetes Program Recommendations:    Admitting serum 330 mg/dL.  Consider adding Levemir 10 units QD.  AM hemoglobin 9.5, thus would encourage patient to follow up with PCP regarding A1C.   Thanks, Bronson Curb, MSN, RNC-OB Diabetes Coordinator [phone] (8a-5p)

## 2019-12-01 NOTE — Evaluation (Signed)
Physical Therapy Evaluation Patient Details Name: Jeffery Young MRN: [id number] DOB: 09-21-1965 Today's Date: 12/01/2019   History of Present Illness  Pt is a 54 y.o. male admitted from [place] facility on 11/30/19 with worsening dyspnea. Found to have RLL PNA. Chest angio 10/26 negative for acute PE; findingsare consistent with infectious bronchiolitis or aspiration. 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    Clinical Impression  Pt presents with an overall decrease in functional mobility secondary to above. PTA, pt with h/o stroke and residual weakness, reports he resides at a "program" and mod indep with RW; pt poor historian and not forthcoming with details regarding PLOF. Today, pt able to initiate transfer and gait training with RW and min-modA (+2 safety). Pt with LLE weakness, which he reports is weaker than baseline. Pt would benefit from continued acute PT services to maximize functional mobility and independence prior to d/c with SNF-level therapies pending progression and assist available at prior facility.     Follow Up Recommendations SNF;Supervision for mobility/OOB    Equipment Recommendations   (defer)    Recommendations for Other Services       Precautions / Restrictions Precautions Precautions: Fall;Other (comment) Precaution Comments: H/o stroke with chronic L-side weakness; h/o bladder/bowel incontinence (pt reports typically wears diaper) Restrictions Weight Bearing Restrictions: No      Mobility  Bed Mobility Overal bed mobility: Needs Assistance Bed Mobility: Supine to Sit     Supine to sit: Supervision;HOB elevated     General bed mobility comments: Increased time and effort, use of bed rail, supervision for safety    Transfers Overall transfer level: Needs assistance Equipment used: Rolling walker (2 wheeled) Transfers: Sit  to/from Stand Sit to Stand: Mod assist;+2 safety/equipment         General transfer comment: ModA to assist trunk elevation; +2 assist for safety with initial L knee block due to weakness, but no overt knee instability noted after initiation to stand; poor eccentric control into sitting  Ambulation/Gait Ambulation/Gait assistance: Min assist;Mod assist Gait Distance (Feet): 2 Feet     Gait velocity: Decreased   General Gait Details: Pivotal steps to recliner with RW and min-modA for stability; modA for RW navigation, repeated cues for sequencing  Stairs            Wheelchair Mobility    Modified Rankin (Stroke Patients Only)       Balance Overall balance assessment: Needs assistance   Sitting balance-Leahy Scale: Fair Sitting balance - Comments: Able to reach bilateral feet to adjust socks     Standing balance-Leahy Scale: Poor Standing balance comment: Reliant on UE support                             Pertinent Vitals/Pain Pain Assessment: Faces Faces Pain Scale: Hurts a little bit Pain Location: LLE Pain Descriptors / Indicators: Discomfort Pain Intervention(s): Monitored during session    Home Living Family/patient expects to be discharged to:: Group home                 Additional Comments: Pt poor historian (not forthcoming with details) - reports he's from a "program" but his wife is still at home    Prior Function Level of Independence: Needs assistance   Gait / Transfers Assistance Needed: Reports mod indep ambulating with RW; h/o LLE weakness since stroke  ADL's / Homemaking Assistance Needed: Reports bladder/bowel incontinence and wears a diaper; likely requires assist from staff for ADL tasks        Hand Dominance        Extremity/Trunk Assessment   Upper Extremity Assessment Upper Extremity Assessment: Defer to OT evaluation    Lower Extremity Assessment Lower Extremity Assessment: LLE deficits/detail;RLE  deficits/detail RLE Deficits / Details: Functionally at least 4/5 throughout LLE Deficits / Details: H/o stroke with L-side weakness; hip flex <3/5, knee ext <3/5, knee flex 3/5, ankle DF <3/5; no overt knee buckling in standing LLE Coordination: decreased gross motor;decreased fine motor       Communication   Communication: Expressive difficulties  Cognition Arousal/Alertness: Awake/alert Behavior During Therapy: Flat affect Overall Cognitive Status: No family/caregiver present to determine baseline cognitive functioning Area of Impairment: Attention;Following commands;Safety/judgement;Problem solving                   Current Attention Level: Sustained;Selective   Following Commands: Follows one step commands consistently;Follows one step commands with increased time Safety/Judgement: Decreased awareness of deficits;Decreased awareness of safety   Problem Solving: Decreased initiation;Requires verbal cues;Difficulty sequencing General Comments: Answering majority of questions and following simple commands appropriately. Perseverating on "I'm hungry... I want food" despite reorientation to NPO and needing MBS      General Comments General comments (skin integrity, edema, etc.): SpO2 down to 84% on RA, maintaining >/88% on 2L O2. Wet-sounding cough but pt unable to clear secretions despite multiple attempts; desaturation with coughing on RA    Exercises     Assessment/Plan    PT Assessment Patient needs continued PT services  PT Problem List Decreased strength;Decreased activity tolerance;Decreased balance;Decreased mobility;Decreased cognition;Decreased knowledge of use of DME;Decreased safety awareness;Decreased knowledge of precautions;Cardiopulmonary status limiting activity       PT Treatment Interventions DME instruction;Gait training;Stair training;Functional mobility training;Therapeutic activities;Therapeutic exercise;Balance training;Patient/family  education;Cognitive remediation    PT Goals (Current goals can be found in the Care Plan section)  Acute Rehab PT Goals Patient Stated Goal: "I want to eat" PT Goal Formulation: With patient Time For Goal Achievement: 12/15/19 Potential to Achieve Goals: Good    Frequency Min 3X/week   Barriers to discharge        Co-evaluation PT/OT/SLP Co-Evaluation/Treatment: Yes Reason for Co-Treatment: Complexity of the patient's impairments (multi-system involvement);For patient/therapist safety;To address functional/ADL transfers PT goals addressed during session: Mobility/safety with mobility;Balance;Proper use of DME         AM-PAC PT "6 Clicks" Mobility  Outcome Measure Help needed turning from your back to your side while in a flat bed without using bedrails?: A Little Help needed moving from lying on your back to sitting on the side of a flat bed without using bedrails?: A Little Help needed moving to and from a bed to a chair (including a wheelchair)?: A Little Help needed standing up from a chair using your arms (e.g., wheelchair or bedside chair)?: A Lot Help needed to walk in hospital room?: A Lot Help needed climbing 3-5 steps with a railing? : A Lot 6 Click Score: 15    End of Session Equipment Utilized During Treatment: Gait belt;Oxygen Activity Tolerance: Patient tolerated treatment well;Patient limited by fatigue Patient left: in chair;with call bell/phone within reach;with chair alarm set Nurse Communication: Mobility status PT Visit Diagnosis: Other abnormalities of gait and mobility (R26.89);Muscle weakness (generalized) (M62.81)    Time: 3016-0109 PT Time Calculation (min) (ACUTE ONLY): 21 min   Charges:   PT Evaluation $  PT Eval Moderate Complexity: [place], [place], DPT Acute Rehabilitation Services  Pager [phone] Office [place] 12/01/2019, 5:16 PM

## 2019-12-01 NOTE — Progress Notes (Signed)
Pharmacy Antibiotic Note  Jeffery Young is a 54 y.o. male admitted on 11/30/2019 with pneumonia.  Pharmacy has been consulted for vancomycin dosing. ESRD on HD MWF - last HD 10/26.   Plan: Cefepime 1g IV Q24h per primary team Vancomycin 500mg  IV post-hemodialysis sessions Monitor clinical progress, culture and sensitivities, antibiotics plan and length of therapy  Pre-HD vancomycin level as indicated F/u HD schedule/tolerance inpatient for antibiotic maintenance doses F/u for de-escalation of therapy   Height: 5\' 5"  (165.1 cm) Weight: 52.3 kg (115 lb 4.8 oz) IBW/kg (Calculated) : 61.5  Temp (24hrs), Avg:98.6 F (37 C), Min:98.4 F (36.9 C), Max:98.9 F (37.2 C)  Recent Labs  Lab 11/30/19 1138 11/30/19 1350 12/01/19 0512  WBC 23.7*  --  21.0*  CREATININE  --  8.16* 8.98*  LATICACIDVEN 1.4 1.2  --     Estimated Creatinine Clearance: 7 mL/min (A) (by C-G formula based on SCr of 8.98 mg/dL (H)).    No Known Allergies   Adria Dill, PharmD- Candidate  12/01/2019 2:31 PM

## 2019-12-01 NOTE — Progress Notes (Signed)
  Echocardiogram 2D Echocardiogram has been performed.  Jeffery Young 12/01/2019, 4:22 PM

## 2019-12-01 NOTE — Progress Notes (Signed)
[facility] Progress Note   Subjective:  Seen on HD this morning - 2.5L net UFG and tolerating. He is still quite dyspneic - using accessory muscles with breathing. CCM consulted overnight - antibiotics broadened and started on IV solumedrol + duo-nebs. VQ scan ordered/pending.    Objective Vitals:   12/01/19 0136 12/01/19 0200 12/01/19 0330 12/01/19 0543  BP:  (!) 147/70 (!) 148/92 (!) 143/69  Pulse: 91 95 94 85  Resp: (!) 22 (!) 23 (!) 21 20  Temp:    98.4 F (36.9 C)  TempSrc:    Oral  SpO2: 92% 96% 96% 100%  Weight:      Height:       Physical Exam General: Ill appearing man. + WOB with abdominal heaving. On HFNC 5L/min. Nods to questions. Heart: RRR; no murmur Lungs: Diffuse rhonchi/wheezing throughout Abdomen: soft, non-tender Extremities: No RLE edema, no L stump edema Dialysis Access: L AVF + thrill (cannulated)  Additional Objective Labs: Basic Metabolic Panel: Recent Labs  Lab 11/30/19 1350 12/01/19 0137 12/01/19 0512  NA 128* 126* 129*  K 4.5 4.1 4.5  CL 81*  --  84*  CO2 28  --  25  GLUCOSE 330*  --  280*  BUN 86*  --  105*  CREATININE 8.16*  --  8.98*  CALCIUM 9.5  --  9.2  PHOS  --   --  6.6*   Liver Function Tests: Recent Labs  Lab 11/30/19 1350 12/01/19 0512  AST 20 21  ALT 21 19  ALKPHOS 81 75  BILITOT 1.3* 1.3*  PROT 7.9 6.9  ALBUMIN 2.9* 2.6*   CBC: Recent Labs  Lab 11/30/19 1138 11/30/19 1138 11/30/19 1209 12/01/19 0137 12/01/19 0512  WBC 23.7*  --   --   --  21.0*  HGB 11.4*   < > 12.6* 12.6* 9.5*  HCT 36.1*   < > 37.0* 37.0* 29.7*  MCV 98.9  --   --   --  100.3*  PLT 396  --   --   --  298   < > = values in this interval not displayed.   Blood Culture    Component Value Date/Time   SDES BLOOD RIGHT ANTECUBITAL 11/30/2019 1235   SPECREQUEST  11/30/2019 1235    BOTTLES DRAWN AEROBIC AND ANAEROBIC Blood Culture results may not be optimal due to an inadequate volume of blood received in culture bottles    CULT  11/30/2019 1235    NO GROWTH < 24 HOURS Performed at [facility], [place] [address], [place], [place] [address]    REPTSTATUS PENDING 11/30/2019 1235   Studies/Results: DG Chest Portable 1 View  Result Date: 11/30/2019 CLINICAL DATA:  Shortness of breath EXAM: PORTABLE CHEST 1 VIEW COMPARISON:  03/19/2019 FINDINGS: The heart size and mediastinal contours are within normal limits. Dense airspace consolidation within the medial aspect of the right lung base compatible with pneumonia. Left lung is clear. No pleural effusion or pneumothorax. The visualized skeletal structures are unremarkable. IMPRESSION: Right lower lobe pneumonia. Recommend radiographic follow-up to resolution. Electronically Signed   By: Davina Poke D.O.   On: 11/30/2019 12:03   Medications: . cefTRIAXone (ROCEPHIN)  IV     . aspirin  81 mg Oral Daily  . atorvastatin  10 mg Oral Daily  . calcitRIOL  0.5 mcg Oral Once per day on Mon Wed Fri  . carvedilol  6.25 mg Oral BID WC  . Chlorhexidine Gluconate Cloth  6 each  Topical Q0600  . doxycycline  100 mg Oral Q12H  . heparin injection (subcutaneous)  5,000 Units Subcutaneous Q8H  . insulin aspart  0-9 Units Subcutaneous TID WC  . ipratropium-albuterol  3 mL Nebulization Q4H  . methylPREDNISolone (SOLU-MEDROL) injection  125 mg Intravenous Q6H  . mometasone-formoterol  2 puff Inhalation BID  . multivitamin  1 tablet Oral QHS  . sertraline  100 mg Oral Daily  . sodium chloride flush  3 mL Intravenous Q12H  . thiamine  100 mg Oral Daily    Dialysis Orders: MWF at [facility] - full HD 10/22, left 1kg up 3:45hr, 400/600, EDW 56kg, 2K/2Ca, AVG, no heparin - Mircera 88mcg IV q 4 weeks - Calcitriol 1.57mcg PO q HD - Venofer 50mg  IV weekly  Assessment/Plan: 1.  RLL pneumonia: BCx drawn, started on Vanc/Cefepime/Doxy + steroids + duo-nebs. Hx COPD. 2.  Acute Hypoxic Resp Failure: Very dyspneic - felt d/t #1, VQ scan ordered/pending. Doesn't appear to  have pulm edema on CXR.  3.  ^ Troponin: ?demand ischemia - per primary team. 4.  ESRD: Usual MWF schedule - dialyzing now - 2.5L UFG. Back to usual sched tomorrow. 5.  Hypertension/volume: BP high - no edema on exam. See above. 6.  Anemia of ESRD: Hgb 9.5 today - follow. 7.  Metabolic bone disease: CorrCa high, Phos high. Resume home binders once eating, holding VDRA for now. 8.  T2DM 9.  CAD/Hx stents 10.  Hx CVA 11. COPD/Former heavy tobacco use   Veneta Penton, PA-C 12/01/2019, 8:04 AM  [employer]

## 2019-12-01 NOTE — ED Notes (Signed)
Primary MD given update at this time.

## 2019-12-01 NOTE — Plan of Care (Signed)

## 2019-12-01 NOTE — Progress Notes (Signed)
OT Cancellation Note  Patient Details Name: Jeffery Young MRN: [id number] DOB: 1965-11-22   Cancelled Treatment:    Reason Eval/Treat Not Completed: Patient at procedure or test/ unavailable (HD. Will return as schedule allows. )  [place], OTR/L Acute Rehab Pager: [phone] Office: [phone] 12/01/2019, 7:25 AM

## 2019-12-01 NOTE — ED Notes (Addendum)
Pt continuously attempting to get on floor after numerous redirections from RN and staff, pt attempted to get on floor, staff stopped pt. Staff got pt repositioned and in bed, rails up x2. RN giving 1-1 from nurse station. MD paged at this time.

## 2019-12-01 NOTE — Progress Notes (Signed)
Spoke with Dr [facility] Attending overnight regarding consult. CCM will see the patient. He may need BIPAP overnight. Appreciate recommendations.  Lattie Haw MD  PGY-2, [facility]

## 2019-12-01 NOTE — Progress Notes (Addendum)
Family Medicine Teaching Service Daily Progress Note Intern Pager: [phone]  Patient name: Jeffery Young Medical record number: [id number] Date of birth: November 02, 1965 Age: 54 y.o. Gender: male  Primary Care Provider: [name], MD Consultants: Nephrology, CCM   Code Status: Full Code  Pt Overview and Major Events to Date:  10/25 Admitted  Assessment and Plan: Mohid Furuya is a 54 y.o. male presenting with hypoxia secondary to PNA . PMH is significant for ESRD, HTN, HLD, Type 2 DM and history of CVA.   Acute hypoxic respiratory failure  Community acquired pneumonia Hypoxic to 55% on room air prior to arrival, initially up to 20L HFNC. Leukocytosis and CXR with evidence of RLL pneumonia. Chronic COPD and volume overload (missed HD yesterday) possibly contributing. Started on broad spectrum antibiotics in the ED. Also started on steroids to cover for potential COPD exacerbation. Seen by CCM overnight, stable for floor for now. ABG with evidence of chronic CO2 retention, normal pH. This morning, improved O2 requirement (5L) but with increased WOB. Pending V/Q scan to rule out PE and TTE to rule out CHF. Swallow screen failed, pending SLP eval. - decrease IV methylprednisolone to 60 mg daily - doxycycline (10/26-) - cefepime (10/25-) - vancomycin (10/25-) - s/p azithromycin 10/25), prolonged QTc 510 - f/u MRSA swab - V/Q scan, consider CTPA instead if able to be performed - f/u TTE - wean O2 as tolerated - continue home COPD meds: Dulera (formulary) - DuoNeb prn  ESRD On MWF HD. Missed HD yesterday due to dyspnea, currently undergoing HD. - HD per nephrology, appreciate involvement  Elevated troponin Likely demand ischemia in the setting of ESRD. No obvious ST changes on EKG, though poor study. Troponins have flattened. [phone] - consider cardiology consult if needed  Hyponatremia Na 125 on admission, likely secondary to dehydration. Improving. Na 129 this AM. -  monitor  HTN Home meds: carvedilol 6.25 mg BID. Normotensive this AM. - continue home meds (pending SLP eval)  Normocytic anemia Likely secondary to ESRD. Drop in Hgb from 12.6 to 9.5 this AM in a span of 4 hours, suspect hemodilution vs. Lab error. - recheck H/H in PM  T2DM Last HbA1c 7.3 in 03/2019. No home antihyperglycemics. AM glucose 280. Some hyperglycemia due to steroids. - sSSI - start Levemir 10u daily - monitor CBG  Other problems chronic and stable: HLD History of tobacco use History of CVA Alcohol use Depression  FEN/GI: NPO, pending SLP eval PPx: [place]  Disposition: progressive  Subjective:  Reports feeling tired this morning.  Denies SOB.  Objective: Temp:  [98.4 F (36.9 C)-98.7 F (37.1 C)] 98.5 F (36.9 C) (10/26 0725) Pulse Rate:  [79-119] 104 (10/26 0830) Resp:  [19-39] 29 (10/26 0830) BP: (129-202)/(68-92) 131/73 (10/26 0830) SpO2:  [90 %-100 %] 94 % (10/26 0725) FiO2 (%):  [80 %-100 %] 80 % (10/25 1918) Weight:  [56 kg-58 kg] 58 kg (10/26 0725) Physical Exam: General: Frail, mild distress Cardiovascular: Mildly tachycardic, regular rhythm, no murmurs Respiratory: Diffuse rhonchi and rales throughout, supraclavicular retractions and thoracoabdominal breathing noted Abdomen: soft, non-tender, +BS Extremities: WWP, no edema  Laboratory: Recent Labs  Lab 11/30/19 1138 11/30/19 1138 11/30/19 1209 12/01/19 0137 12/01/19 0512  WBC 23.7*  --   --   --  21.0*  HGB 11.4*   < > 12.6* 12.6* 9.5*  HCT 36.1*   < > 37.0* 37.0* 29.7*  PLT 396  --   --   --  298   < > =  values in this interval not displayed.   Recent Labs  Lab 11/30/19 1350 12/01/19 0137 12/01/19 0512  NA 128* 126* 129*  K 4.5 4.1 4.5  CL 81*  --  84*  CO2 28  --  25  BUN 86*  --  105*  CREATININE 8.16*  --  8.98*  CALCIUM 9.5  --  9.2  PROT 7.9  --  6.9  BILITOT 1.3*  --  1.3*  ALKPHOS 81  --  75  ALT 21  --  19  AST 20  --  21  GLUCOSE 330*  --  280*     Imaging/Diagnostic Tests: DG Chest Portable 1 View  Result Date: 11/30/2019 CLINICAL DATA:  Shortness of breath EXAM: PORTABLE CHEST 1 VIEW COMPARISON:  03/19/2019 FINDINGS: The heart size and mediastinal contours are within normal limits. Dense airspace consolidation within the medial aspect of the right lung base compatible with pneumonia. Left lung is clear. No pleural effusion or pneumothorax. The visualized skeletal structures are unremarkable. IMPRESSION: Right lower lobe pneumonia. Recommend radiographic follow-up to resolution. Electronically Signed   By: Davina Poke D.O.   On: 11/30/2019 12:03    Zola Button, MD 12/01/2019, 8:42 AM PGY-1, [place] Intern pager: [phone], text pages welcome

## 2019-12-01 NOTE — Evaluation (Signed)
Occupational Therapy Evaluation Patient Details Name: Jeffery Young MRN: [id number] DOB: 03/05/1965 Today's Date: 12/01/2019    History of Present Illness Pt is a 54 y.o. male admitted from [facility] facility on 11/30/19 with worsening dyspnea. Found to have RLL PNA. Chest angio 10/26 negative for acute PE; findingsare consistent with infectious bronchiolitis or aspiration. 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   Clinical Impression   PTA, pt reports he was living at a "program" and performed BADLs and used RW for mobility; unsure of accuracy as pt presenting with cognitive deficits. Presenting with decreased strength, functional use of LUE, cognition, and activity tolerance.  Pt currently requiring Min guard A for UB ADLs, Mod A for LB ADLs, and Mod A +2 for functional transfers. SpO2 dropping to 84% on RA with coughing; maintained >88% on 2L. Notified RN. Pt would benefit from further acute OT to facilitate safe dc. Recommend dc to SNF for further OT to optimize safety, independence with ADLs, and return to PLOF.     Follow Up Recommendations  SNF    Equipment Recommendations  Other (comment) (Defer to next venue)    Recommendations for Other Services PT consult     Precautions / Restrictions Precautions Precautions: Fall;Other (comment) Precaution Comments: H/o stroke with chronic L-side weakness; h/o bladder/bowel incontinence (pt reports typically wears diaper) Restrictions Weight Bearing Restrictions: No      Mobility Bed Mobility Overal bed mobility: Needs Assistance Bed Mobility: Supine to Sit     Supine to sit: Supervision;HOB elevated     General bed mobility comments: Increased time and effort, use of bed rail, supervision for safety    Transfers Overall transfer level: Needs assistance Equipment used: Rolling walker (2 wheeled) Transfers:  Sit to/from Stand Sit to Stand: Mod assist;+2 safety/equipment         General transfer comment: ModA to assist trunk elevation; +2 assist for safety with initial L knee block due to weakness, but no overt knee instability noted after initiation to stand; poor eccentric control into sitting    Balance Overall balance assessment: Needs assistance   Sitting balance-Leahy Scale: Fair Sitting balance - Comments: Able to reach bilateral feet to adjust socks     Standing balance-Leahy Scale: Poor Standing balance comment: Reliant on UE support                           ADL either performed or assessed with clinical judgement   ADL Overall ADL's : Needs assistance/impaired Eating/Feeding: NPO   Grooming: Supervision/safety;Set up;Sitting   Upper Body Bathing: Min guard;Sitting   Lower Body Bathing: Moderate assistance;Sit to/from stand   Upper Body Dressing : Min guard;Sitting   Lower Body Dressing: Moderate assistance;Sit to/from stand Lower Body Dressing Details (indicate cue type and reason): Pt able to bend forward and adjust sock without LOB. Mod A for standing balance Toilet Transfer: Moderate assistance;+2 for physical assistance;+2 for safety/equipment;Stand-pivot;RW (simulated to recliner)           Functional mobility during ADLs: Moderate assistance;+2 for physical assistance;+2 for safety/equipment;Rolling walker General ADL Comments: Pt presenting with poor balance, cognition, strength, and activity tolerance.     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 Disconjugate gaze but feel this is baseline. Tracking to all four quad     Perception  Praxis      Pertinent Vitals/Pain Pain Assessment: Faces Faces Pain Scale: Hurts a little bit Pain Location: LLE Pain Descriptors / Indicators: Discomfort Pain Intervention(s): Monitored during session;Repositioned     Hand Dominance Right    Extremity/Trunk Assessment Upper Extremity Assessment Upper Extremity Assessment: LUE deficits/detail LUE Deficits / Details: Decreased grasp strength and gross motor coordination. Limited shoulder ROM. LUE Coordination: decreased gross motor;decreased fine motor   Lower Extremity Assessment Lower Extremity Assessment: Defer to PT evaluation RLE Deficits / Details: Functionally at least 4/5 throughout LLE Deficits / Details: H/o stroke with L-side weakness; hip flex <3/5, knee ext <3/5, knee flex 3/5, ankle DF <3/5; no overt knee buckling in standing LLE Coordination: decreased gross motor;decreased fine motor       Communication Communication Communication: Expressive difficulties   Cognition Arousal/Alertness: Awake/alert Behavior During Therapy: Flat affect Overall Cognitive Status: No family/caregiver present to determine baseline cognitive functioning Area of Impairment: Attention;Following commands;Safety/judgement;Problem solving                   Current Attention Level: Sustained;Selective   Following Commands: Follows one step commands consistently;Follows one step commands with increased time Safety/Judgement: Decreased awareness of deficits;Decreased awareness of safety   Problem Solving: Decreased initiation;Requires verbal cues;Difficulty sequencing General Comments: Answering majority of questions and following simple commands appropriately. Perseverating on "I'm hungry... I want food" despite reorientation to NPO and needing MBS   General Comments  SpO2 down to 84% on RA, maintaining >/88% on 2L O2. Wet-sounding cough but pt unable to clear secretions despite multiple attempts; desaturation with coughing on RA    Exercises     Shoulder Instructions      Home Living Family/patient expects to be discharged to:: Group home                                 Additional Comments: Pt poor historian (not forthcoming with details) - reports he's  from a "program" but his wife is still at home      Prior Functioning/Environment Level of Independence: Needs assistance  Gait / Transfers Assistance Needed: Reports mod indep ambulating with RW; h/o LLE weakness since stroke ADL's / Homemaking Assistance Needed: Reports bladder/bowel incontinence and wears a diaper; likely requires assist from staff for ADL tasks            OT Problem List: Decreased strength;Decreased range of motion;Decreased activity tolerance;Impaired balance (sitting and/or standing);Decreased knowledge of use of DME or AE;Decreased knowledge of precautions;Impaired UE functional use      OT Treatment/Interventions: Self-care/ADL training;Therapeutic exercise;Energy conservation;DME and/or AE instruction;Therapeutic activities;Patient/family education    OT Goals(Current goals can be found in the care plan section) Acute Rehab OT Goals Patient Stated Goal: "I want to eat" OT Goal Formulation: With patient Time For Goal Achievement: 12/15/19 Potential to Achieve Goals: Good  OT Frequency: Min 2X/week   Barriers to D/C:            Co-evaluation PT/OT/SLP Co-Evaluation/Treatment: Yes Reason for Co-Treatment: For patient/therapist safety;To address functional/ADL transfers PT goals addressed during session: Mobility/safety with mobility;Balance;Proper use of DME OT goals addressed during session: ADL's and self-care      AM-PAC OT "6 Clicks" Daily Activity     Outcome Measure Help from another person eating meals?: Total Help from another person taking care of personal grooming?: A Little Help from another person toileting, which includes using toliet, bedpan, or urinal?: A  Lot Help from another person bathing (including washing, rinsing, drying)?: A Lot Help from another person to put on and taking off regular upper body clothing?: A Little Help from another person to put on and taking off regular lower body clothing?: A Lot 6 Click Score: 13   End  of Session Equipment Utilized During Treatment: Rolling walker;Oxygen (2L) Nurse Communication: Mobility status;Other (comment) (No chair alarm cord, but chair alarm on)  Activity Tolerance: Patient limited by fatigue Patient left: in chair;with call bell/phone within reach;with chair alarm set  OT Visit Diagnosis: Unsteadiness on feet (R26.81);Other abnormalities of gait and mobility (R26.89);Muscle weakness (generalized) (M62.81)                Time: 8979-1504 OT Time Calculation (min): 21 min Charges:  OT General Charges $OT Visit: 1 Visit OT Evaluation $OT Eval Moderate Complexity: [place], OTR/L Acute Rehab Pager: [phone] Office: [place] 12/01/2019, 5:35 PM

## 2019-12-01 NOTE — CV Procedure (Signed)
Echocardiogram not completed, patient going to CT at 14:24.  Darlina Sicilian RDCS

## 2019-12-01 NOTE — Progress Notes (Signed)
PT Cancellation Note  Patient Details Name: Jeffery Young MRN: [id number] DOB: 12-Oct-1965   Cancelled Treatment:    Reason Eval/Treat Not Completed: Patient at procedure or test/unavailable (HD). Will follow-up for PT Evaluation as schedule permits.  Mabeline Caras, PT, DPT Acute Rehabilitation Services  Pager [phone] Office [place] 12/01/2019, 7:32 AM

## 2019-12-02 ENCOUNTER — Inpatient Hospital Stay (HOSPITAL_COMMUNITY): Payer: BC Managed Care – PPO

## 2019-12-02 DIAGNOSIS — D631 Anemia in chronic kidney disease: Secondary | ICD-10-CM

## 2019-12-02 DIAGNOSIS — N189 Chronic kidney disease, unspecified: Secondary | ICD-10-CM

## 2019-12-02 DIAGNOSIS — I5042 Chronic combined systolic (congestive) and diastolic (congestive) heart failure: Secondary | ICD-10-CM

## 2019-12-02 DIAGNOSIS — F32A Depression, unspecified: Secondary | ICD-10-CM

## 2019-12-02 LAB — GLUCOSE, CAPILLARY
Glucose-Capillary: 160 mg/dL — ABNORMAL HIGH (ref 70–99)
Glucose-Capillary: 128 mg/dL — ABNORMAL HIGH (ref 70–99)
Glucose-Capillary: 289 mg/dL — ABNORMAL HIGH (ref 70–99)
Glucose-Capillary: 387 mg/dL — ABNORMAL HIGH (ref 70–99)
Glucose-Capillary: 322 mg/dL — ABNORMAL HIGH (ref 70–99)

## 2019-12-02 LAB — CBC
HCT: 31.3 % — ABNORMAL LOW (ref 39.0–52.0)
Hemoglobin: 9.9 g/dL — ABNORMAL LOW (ref 13.0–17.0)
MCH: 31.3 pg (ref 26.0–34.0)
MCHC: 31.6 g/dL (ref 30.0–36.0)
MCV: 99.1 fL (ref 80.0–100.0)
Platelets: 304 10*3/uL (ref 150–400)
RBC: 3.16 MIL/uL — ABNORMAL LOW (ref 4.22–5.81)
RDW: 13.6 % (ref 11.5–15.5)
WBC: 19.8 10*3/uL — ABNORMAL HIGH (ref 4.0–10.5)
nRBC: 0 % (ref 0.0–0.2)

## 2019-12-02 LAB — RENAL FUNCTION PANEL
Albumin: 2.6 g/dL — ABNORMAL LOW (ref 3.5–5.0)
Anion gap: 15 (ref 5–15)
BUN: 52 mg/dL — ABNORMAL HIGH (ref 6–20)
CO2: 26 mmol/L (ref 22–32)
Calcium: 8.8 mg/dL — ABNORMAL LOW (ref 8.9–10.3)
Chloride: 94 mmol/L — ABNORMAL LOW (ref 98–111)
Creatinine, Ser: 5.19 mg/dL — ABNORMAL HIGH (ref 0.61–1.24)
GFR, Estimated: 12 mL/min — ABNORMAL LOW (ref >60)
Glucose, Bld: 158 mg/dL — ABNORMAL HIGH (ref 70–99)
Phosphorus: 5.8 mg/dL — ABNORMAL HIGH (ref 2.5–4.6)
Potassium: 3.5 mmol/L (ref 3.5–5.1)
Sodium: 135 mmol/L (ref 135–145)

## 2019-12-02 LAB — HEPATITIS B SURFACE ANTIBODY, QUANTITATIVE: Hep B S AB Quant (Post): >1000 m[IU]/mL (ref >9.9)

## 2019-12-02 MED ORDER — RESOURCE THICKENUP CLEAR PO POWD
ORAL | Status: DC | PRN
  Filled 2019-12-02: qty 125

## 2019-12-02 MED ORDER — INSULIN ASPART 100 UNIT/ML ~~LOC~~ SOLN
0.0000 [IU] | Freq: Every day | SUBCUTANEOUS | Status: DC
  Administered 2019-12-02: 4 [IU] via SUBCUTANEOUS
  Administered 2019-12-04: 5 [IU] via SUBCUTANEOUS

## 2019-12-02 MED ORDER — INSULIN ASPART 100 UNIT/ML ~~LOC~~ SOLN
0.0000 [IU] | Freq: Three times a day (TID) | SUBCUTANEOUS | Status: DC
  Administered 2019-12-03: 2 [IU] via SUBCUTANEOUS
  Administered 2019-12-03: 3 [IU] via SUBCUTANEOUS
  Administered 2019-12-04: 2 [IU] via SUBCUTANEOUS

## 2019-12-02 MED ORDER — IPRATROPIUM-ALBUTEROL 0.5-2.5 (3) MG/3ML IN SOLN: 3.0000 mL | RESPIRATORY_TRACT | Status: DC | PRN

## 2019-12-02 MED ORDER — HEPARIN SODIUM (PORCINE) 5000 UNIT/ML IJ SOLN
5000.0000 [IU] | Freq: Three times a day (TID) | INTRAMUSCULAR | Status: DC
  Administered 2019-12-04 (×3): 5000 [IU] via SUBCUTANEOUS
  Filled 2019-12-02 (×3): qty 1

## 2019-12-02 MED ORDER — CARVEDILOL 6.25 MG PO TABS
6.2500 mg | ORAL_TABLET | Freq: Two times a day (BID) | ORAL | Status: DC
  Administered 2019-12-02 – 2019-12-04 (×5): 6.25 mg via ORAL
  Filled 2019-12-02 (×5): qty 1

## 2019-12-02 MED ORDER — IPRATROPIUM-ALBUTEROL 0.5-2.5 (3) MG/3ML IN SOLN
3.0000 mL | Freq: Three times a day (TID) | RESPIRATORY_TRACT | Status: DC
  Administered 2019-12-02 – 2019-12-03 (×3): 3 mL via RESPIRATORY_TRACT
  Filled 2019-12-02 (×3): qty 3

## 2019-12-02 NOTE — Progress Notes (Signed)
  Speech Language Pathology  Patient Details Name: Jeffery Young MRN: [id number] DOB: 07-Feb-1965 Today's Date: 12/02/2019 Time:  -     Pt's MBS scheduled for this morning at 9:00             [place] Siren 12/02/2019, 8:25 AM  Orbie Pyo Colvin Caroli.Ed Risk analyst [phone] Office [phone]

## 2019-12-02 NOTE — Progress Notes (Signed)
Modified Barium Swallow Progress Note  Patient Details  Name: Jeffery Young MRN: [id number] Date of Birth: [birth year]  Today's Date: 12/02/2019  Modified Barium Swallow completed.  Full report located under Chart Review in the Imaging Section.  Brief recommendations include the following:  Clinical Impression  Pt has a history of oropharyngeal dysphagia following stroke in 2019 and continued during today's study. Significant pharyngeal congestion noted yesterday during BSE however today respirations subjectively clear without audible congestion. Oral impairments are mild and marked by difficulty coordinating and timing propulsion. Motor strength was within normal limits. Timing is delayed in regards to reflexive onset of laryngeal closure resulting in barium reaching and remaining in valleculae for 7 seconds and pyriform sinuses up to 10 seconds. His pyriform sinuses filled leading to barium spilling into laryngeal vestibule and vocal cords during swallow. The thicker the consistency, the less barium able to reach lower pharynx. Verbal cues given to propel and attempt to initiate swallow faster without significant difference and chick tuck posture ineffective strategy. Sensation and awareness of vocal cord penetration was inconsistent and decreased. Honey thick texture better coordinated, barium upper pharynx when initiated and no pharyngeal residue throughout this study. Recommended honey thick liquids, small sips, Dys 3 texture, volitional cough, crush meds and full supervision.        Swallow Evaluation Recommendations       SLP Diet Recommendations: Dysphagia 3 (Mech soft) solids;Honey thick liquids   Liquid Administration via: Cup   Medication Administration: Crushed with puree   Supervision: Patient able to self feed;Full supervision/cueing for compensatory strategies   Compensations: Minimize environmental distractions;Slow rate;Small sips/bites;Lingual sweep for clearance of  pocketing;Clear throat intermittently   Postural Changes: Seated upright at 90 degrees   Oral Care Recommendations: Oral care BID   Other Recommendations: Order thickener from pharmacy    [place] Siren 12/02/2019,10:42 AM   Orbie Pyo Colvin Caroli.Ed Risk analyst [phone] Office [phone]

## 2019-12-02 NOTE — Progress Notes (Signed)
  Speech Language Pathology Treatment: Dysphagia  Patient Details Name: Jeffery Young MRN: [id number] DOB: [birth year] Today's Date: 12/02/2019 Time: 1031-5945 SLP Time Calculation (min) (ACUTE ONLY): 11 min  Assessment / Plan / Recommendation Clinical Impression  Pt observed with honey thickened coffee and grape juice (informed by PT of request). Appeared to tolerate initial sip honey thick coffee but strong immediate cough after subsequent sip. Therapist determined coffee had thinned out to closer to a thin liquid. He needed reminders for smaller sips. During MBS his reflexive cough is very strong in relation to amount of penetration observed (notified RN of this). No s/s aspiration with applesauce. ST will follow closely. He needs full supervision.   HPI HPI: 54 yr old admitted with worsening dyspnea, afebrile and productive cough from [facility], Per chart O2 sat noted to be 55% on room air. COVID negative. Found to have CAP. PMH: basal ganglia/pons stroke 2019, ESRD, HTN, DM, NSTEMI, CAD. CXR RRL pna. Outpatient MBS 10/08/19 >pt reported hx of dysphagia in 2019. Found to have delayed pharyngeal swallow with penetration (PAS 3) of large boluses. Reg/thin recommended. Pt admitted to fast rate of eating.       SLP Plan  Continue with current plan of care       Recommendations  Diet recommendations: Dysphagia 3 (mechanical soft);Honey-thick liquid Liquids provided via: Cup;No straw Medication Administration: Crushed with puree Supervision: Patient able to self feed;Full supervision/cueing for compensatory strategies Compensations: Slow rate;Small sips/bites;Clear throat intermittently Postural Changes and/or Swallow Maneuvers: Seated upright 90 degrees                Oral Care Recommendations: Oral care BID Follow up Recommendations: Skilled Nursing facility SLP Visit Diagnosis: Dysphagia, oropharyngeal phase (R13.12) Plan: Continue with current plan of  care                       Jeffery Young 12/02/2019, 11:13 AM Jeffery Young.Ed Risk analyst [phone] Office [phone]

## 2019-12-02 NOTE — Progress Notes (Signed)
Pt brought up to dialysis unit for his treatment - found that his LUE AVG is clotted. No bruit throughout - attempted to stick anyway - no blood return. Will send back to room and call IR to see about declot procedure. He has already eaten today. Suspect will have to be scheduled tomorrow morning.  Veneta Penton, PA-C [employer] Pager [phone]

## 2019-12-02 NOTE — Progress Notes (Signed)
Family Medicine Teaching Service Daily Progress Note Intern Pager: [phone]  Patient name: Jeffery Young Medical record number: [id number] Date of birth: [birth year] Age: 54 y.o. Gender: male  Primary Care Provider: [name], MD Consultants: Nephrology, CCM   Code Status: Full Code  Pt Overview and Major Events to Date:  10/25 Admitted  Assessment and Plan: Jeffery Young is a 54 y.o. male presenting with hypoxia secondary to PNA . PMH is significant for ESRD, HTN, HLD, Type 2 DM and history of CVA.   Acute hypoxic respiratory failure  Community acquired pneumonia Hypoxic to 55% on room air prior to arrival, initially up to 20L HFNC. Leukocytosis and CXR with evidence of RLL pneumonia, possible aspiration. Chronic COPD possibly contributing, started on steroids for potential COPD exacerbation. Started on broad spectrum antibiotics in the ED. ABG with evidence of chronic CO2 retention, normal pH. CTPA negative for PE. EF 55-60%, G1DD on TTE. Improving, currently on 2L O2 this AM without increased WOB. - IV methylprednisolone to 60 mg daily (10/25-) - doxycycline (10/26-) - cefepime (10/25-), consider de-escalating to CTX today or tomorrow - vancomycin (10/25-), consider discontinuing (negative MRSA swab) - s/p azithromycin 10/25) - wean O2 as tolerated - continue home COPD meds: Dulera (formulary) - DuoNeb prn, consider restarting LAMA tomorrow  ESRD On MWF HD.  - HD per nephrology, appreciate involvement  Dysphagia Failed swallow screen and bedside SLP eval. Converted some medications to IV formulation. - MBS today per SLP - NPO pending MBS  QTc prolongation QTc 510 on admission. - avoid QT prolonging medications  Elevated troponin Likely demand ischemia in the setting of ESRD. No obvious ST changes on EKG, though poor study. Troponins have flattened. No chest pain. - monitor for symptoms  Hyponatremia Resolved.  HTN Home meds: carvedilol 6.25 mg BID. Mildly  hypertensive 153/71 this AM. - IV metoprolol tartrate 2.5 mg q12h, switch back to home carvedilol when able to tolerate PO  Normocytic anemia Likely secondary to ESRD. Hgb stable.  T2DM Last HbA1c 7.3 in 03/2019. No home antihyperglycemics. Some hyperglycemia due to steroids. Good glycemic control. - sSSI - Levemir 10u daily - monitor CBG  Other problems chronic and stable: HLD History of tobacco use History of CVA Alcohol use Depression  FEN/GI: NPO PPx: [place]  Disposition: progressive  Subjective:  Reports feeling okay this morning.  Denies shortness of breath.  He reports feeling hungry as he has been n.p.o.  Objective: Temp:  [97.5 F (36.4 C)-99 F (37.2 C)] 97.7 F (36.5 C) (10/27 0725) Pulse Rate:  [64-104] 77 (10/27 0725) Resp:  [17-31] 17 (10/27 0725) BP: (104-153)/(60-77) 153/71 (10/27 0725) SpO2:  [90 %-98 %] 90 % (10/27 0725) FiO2 (%):  [28 %] 28 % (10/26 2329) Weight:  [52.3 kg] 52.3 kg (10/26 1130) Physical Exam: General: NAD Cardiovascular: RRR, no murmurs Respiratory: Rales heard in the RLL, no rhonchi, no signs of respiratory distress, breathing comfortably on 2L [place] Abdomen: soft, non-tender, +BS Extremities: WWP, no edema  Laboratory: Recent Labs  Lab 11/30/19 1138 11/30/19 1209 12/01/19 0512 12/01/19 1537 12/02/19 0300  WBC 23.7*  --  21.0*  --  19.8*  HGB 11.4*   < > 9.5* 10.1* 9.9*  HCT 36.1*   < > 29.7* 31.4* 31.3*  PLT 396  --  298  --  304   < > = values in this interval not displayed.   Recent Labs  Lab 11/30/19 1350 11/30/19 1350 12/01/19 0137 12/01/19 0512 12/02/19 0300  NA 128*   < >  126* 129* 135  K 4.5   < > 4.1 4.5 3.5  CL 81*  --   --  84* 94*  CO2 28  --   --  25 26  BUN 86*  --   --  105* 52*  CREATININE 8.16*  --   --  8.98* 5.19*  CALCIUM 9.5  --   --  9.2 8.8*  PROT 7.9  --   --  6.9  --   BILITOT 1.3*  --   --  1.3*  --   ALKPHOS 81  --   --  75  --   ALT 21  --   --  19  --   AST 20  --   --  21  --    GLUCOSE 330*  --   --  280* 158*   < > = values in this interval not displayed.    Imaging/Diagnostic Tests: CT ANGIO CHEST PE W OR WO CONTRAST  Result Date: 12/01/2019 CLINICAL DATA:  Chest pain.  Shortness of breath. EXAM: CT ANGIOGRAPHY CHEST WITH CONTRAST TECHNIQUE: Multidetector CT imaging of the chest was performed using the standard protocol during bolus administration of intravenous contrast. Multiplanar CT image reconstructions and MIPs were obtained to evaluate the vascular anatomy. CONTRAST:  36mL OMNIPAQUE IOHEXOL 350 MG/ML SOLN COMPARISON:  None. FINDINGS: Cardiovascular: Contrast injection is sufficient to demonstrate satisfactory opacification of the pulmonary arteries to the segmental level. There is no pulmonary embolus or evidence of right heart strain. The size of the main pulmonary artery is normal. Normal heart size with coronary artery calcification. Mild atherosclerotic changes are noted of the thoracic aorta without evidence for an aneurysm or dissection. There is no significant pericardial effusion. Mediastinum/Nodes: --there are mildly enlarged mediastinal lymph nodes. For example there is a precarinal lymph node measuring 1.2 cm (axial series 5, image 72). --there are mildly enlarged hilar lymph nodes bilaterally. For example there is a right hilar lymph node measuring 1.1 cm (axial series 5, image 82). -- No axillary lymphadenopathy. -- No supraclavicular lymphadenopathy. -- Normal thyroid gland where visualized. -  Unremarkable esophagus. Lungs/Pleura: There are diffuse bilateral tree-in-bud airspace opacities with areas of bronchial wall thickening and significant mucous plugging, especially in the lower lobes. There is debris within the trachea and right mainstem bronchus. There is an area of consolidation within the right lower lobe measuring approximately 4.8 cm. There is no pneumothorax. No large pleural effusion. Upper Abdomen: Contrast bolus timing is not optimized for  evaluation of the abdominal organs. The visualized portions of the organs of the upper abdomen are normal. Musculoskeletal: No chest wall abnormality. No bony spinal canal stenosis. Review of the MIP images confirms the above findings. IMPRESSION: 1. No acute pulmonary embolism. 2. Diffuse bilateral tree-in-bud airspace opacities with areas of bronchial wall thickening and significant mucous plugging, especially in the lower lobes. There is an area of consolidation within the right lower lobe measuring approximately 4.8 cm. Findings are consistent with infectious bronchiolitis or aspiration. Follow-up to radiologic resolution for the area of consolidation in the right lower lobe is recommended. 3. Mediastinal and hilar adenopathy, likely reactive. 4. Coronary artery disease. Aortic Atherosclerosis (ICD10-I70.0). Electronically Signed   By: Constance Holster M.D.   On: 12/01/2019 15:16   ECHOCARDIOGRAM COMPLETE  Result Date: 12/01/2019    ECHOCARDIOGRAM REPORT   Patient Name:   VELMER WOELFEL Date of Exam: 12/01/2019 Medical Rec #:  [id number]    Height:  65.0 in Accession #:    [id number]   Weight:       115.3 lb Date of Birth:  1965-03-27    BSA:          1.565 m Patient Age:    55 years     BP:           109/66 mmHg Patient Gender: M            HR:           86 bpm. Exam Location:  Inpatient Procedure: 2D Echo, Cardiac Doppler and Color Doppler Indications:    Dyspnea  History:        Patient has prior history of Echocardiogram examinations, most                 recent 10/17/2017. CHF, CAD, COPD; Risk Factors:Diabetes,                 Hypertension, Former Smoker and Dyslipidemia. H/O stroke. ESRD.  Sonographer:    Clayton Lefort RDCS (AE) Referring Phys: [place]  1. Left ventricular ejection fraction, by estimation, is 55 to 60%. The left ventricle has normal function. The left ventricle has no regional wall motion abnormalities. Left ventricular diastolic parameters are consistent  with Grade I diastolic dysfunction (impaired relaxation).  2. Right ventricular systolic function is normal. The right ventricular size is normal. Tricuspid regurgitation signal is inadequate for assessing PA pressure.  3. The mitral valve is normal in structure. No evidence of mitral valve regurgitation. No evidence of mitral stenosis.  4. The aortic valve is normal in structure. Aortic valve regurgitation is not visualized. Mild aortic valve sclerosis is present, with no evidence of aortic valve stenosis.  5. Aortic dilatation noted. There is borderline dilatation of the aortic root, measuring 39 mm.  6. The inferior vena cava is normal in size with greater than 50% respiratory variability, suggesting right atrial pressure of 3 mmHg. FINDINGS  Left Ventricle: Left ventricular ejection fraction, by estimation, is 55 to 60%. The left ventricle has normal function. The left ventricle has no regional wall motion abnormalities. The left ventricular internal cavity size was normal in size. There is  no left ventricular hypertrophy. Left ventricular diastolic parameters are consistent with Grade I diastolic dysfunction (impaired relaxation). Normal left ventricular filling pressure. Right Ventricle: The right ventricular size is normal. No increase in right ventricular wall thickness. Right ventricular systolic function is normal. Tricuspid regurgitation signal is inadequate for assessing PA pressure. Left Atrium: Left atrial size was normal in size. Right Atrium: Right atrial size was normal in size. Pericardium: There is no evidence of pericardial effusion. Mitral Valve: The mitral valve is normal in structure. No evidence of mitral valve regurgitation. No evidence of mitral valve stenosis. Tricuspid Valve: The tricuspid valve is normal in structure. Tricuspid valve regurgitation is not demonstrated. No evidence of tricuspid stenosis. Aortic Valve: The aortic valve is normal in structure. Aortic valve regurgitation is  not visualized. Mild aortic valve sclerosis is present, with no evidence of aortic valve stenosis. Aortic valve mean gradient measures 3.0 mmHg. Aortic valve peak gradient measures 6.6 mmHg. Aortic valve area, by VTI measures 2.67 cm. Pulmonic Valve: The pulmonic valve was normal in structure. Pulmonic valve regurgitation is not visualized. No evidence of pulmonic stenosis. Aorta: The aortic root is normal in size and structure and aortic dilatation noted. There is borderline dilatation of the aortic root, measuring 39 mm. Venous: The inferior vena cava  is normal in size with greater than 50% respiratory variability, suggesting right atrial pressure of 3 mmHg. IAS/Shunts: No atrial level shunt detected by color flow Doppler.  LEFT VENTRICLE PLAX 2D LVIDd:         4.10 cm  Diastology LVIDs:         2.90 cm  LV e' medial:    6.53 cm/s LV PW:         1.10 cm  LV E/e' medial:  10.7 LV IVS:        1.10 cm  LV e' lateral:   12.50 cm/s LVOT diam:     2.10 cm  LV E/e' lateral: 5.6 LV SV:         52 LV SV Index:   33 LVOT Area:     3.46 cm  RIGHT VENTRICLE             IVC RV Basal diam:  3.00 cm     IVC diam: 1.40 cm RV S prime:     13.50 cm/s TAPSE (M-mode): 2.0 cm LEFT ATRIUM           Index       RIGHT ATRIUM           Index LA diam:      2.60 cm 1.66 cm/m  RA Area:     14.40 cm LA Vol (A4C): 24.7 ml 15.78 ml/m RA Volume:   35.50 ml  22.68 ml/m  AORTIC VALVE AV Area (Vmax):    3.11 cm AV Area (Vmean):   3.17 cm AV Area (VTI):     2.67 cm AV Vmax:           128.00 cm/s AV Vmean:          84.500 cm/s AV VTI:            0.196 m AV Peak Grad:      6.6 mmHg AV Mean Grad:      3.0 mmHg LVOT Vmax:         115.00 cm/s LVOT Vmean:        77.300 cm/s LVOT VTI:          0.151 m LVOT/AV VTI ratio: 0.77  AORTA Ao Root diam: 3.90 cm MITRAL VALVE MV Area (PHT): 2.71 cm    SHUNTS MV Decel Time: 280 msec    Systemic VTI:  0.15 m MV E velocity: 69.90 cm/s  Systemic Diam: 2.10 cm MV A velocity: 71.30 cm/s MV E/A ratio:  0.98 Mihai  Croitoru MD Electronically signed by Sanda Klein MD Signature Date/Time: 12/01/2019/5:29:12 PM    Final     Zola Button, MD 12/02/2019, 7:47 AM PGY-1, [place] Intern pager: [phone], text pages welcome

## 2019-12-02 NOTE — Progress Notes (Signed)
[facility] Progress Note   Subjective:  Seen in room, much better in appearance, calm and not coughing, on 2L [place] . No new c/o  Objective Vitals:   12/01/19 2329 12/02/19 0320 12/02/19 0725 12/02/19 1212  BP:  (!) 144/66 (!) 153/71 132/66  Pulse:  64 77 65  Resp:  20 17 17   Temp:  98.9 F (37.2 C) 97.7 F (36.5 C) 98 F (36.7 C)  TempSrc:  Oral Oral   SpO2: 95% 95% 90% 97%  Weight:      Height:       Physical Exam General: chron ill , looks much better today, min coughing, respirations appear more wnl Heart: RRR; no murmur Lungs: occ exp wheezes, no rales, occ rhonchi Abdomen: soft, non-tender Extremities: No RLE edema, no L stump edema Dialysis Access: L AVF + thrill   Additional Objective Labs: Basic Metabolic Panel: Recent Labs  Lab 11/30/19 1350 11/30/19 1350 12/01/19 0137 12/01/19 0512 12/02/19 0300  NA 128*   < > 126* 129* 135  K 4.5   < > 4.1 4.5 3.5  CL 81*  --   --  84* 94*  CO2 28  --   --  25 26  GLUCOSE 330*  --   --  280* 158*  BUN 86*  --   --  105* 52*  CREATININE 8.16*  --   --  8.98* 5.19*  CALCIUM 9.5  --   --  9.2 8.8*  PHOS  --   --   --  6.6* 5.8*   < > = values in this interval not displayed.   Liver Function Tests: Recent Labs  Lab 11/30/19 1350 12/01/19 0512 12/02/19 0300  AST 20 21  --   ALT 21 19  --   ALKPHOS 81 75  --   BILITOT 1.3* 1.3*  --   PROT 7.9 6.9  --   ALBUMIN 2.9* 2.6* 2.6*   CBC: Recent Labs  Lab 11/30/19 1138 11/30/19 1209 12/01/19 0512 12/01/19 1537 12/02/19 0300  WBC 23.7*  --  21.0*  --  19.8*  HGB 11.4*   < > 9.5* 10.1* 9.9*  HCT 36.1*   < > 29.7* 31.4* 31.3*  MCV 98.9  --  100.3*  --  99.1  PLT 396  --  298  --  304   < > = values in this interval not displayed.   Blood Culture    Component Value Date/Time   SDES BLOOD RIGHT ANTECUBITAL 11/30/2019 1235   SPECREQUEST  11/30/2019 1235    BOTTLES DRAWN AEROBIC AND ANAEROBIC Blood Culture results may not be optimal due to an  inadequate volume of blood received in culture bottles   CULT  11/30/2019 1235    NO GROWTH 2 DAYS Performed at [facility], [place] [address], [place], [place] [address]    REPTSTATUS PENDING 11/30/2019 1235   Medications: . ceFEPime (MAXIPIME) IV 1 g (12/02/19 1226)  . doxycycline (VIBRAMYCIN) IV Stopped (12/02/19 1107)   . aspirin  81 mg Oral Daily  . atorvastatin  10 mg Oral Daily  . carvedilol  6.25 mg Oral BID WC  . Chlorhexidine Gluconate Cloth  6 each Topical Q0600  . heparin injection (subcutaneous)  5,000 Units Subcutaneous Q8H  . insulin aspart  0-9 Units Subcutaneous TID WC  . insulin detemir  10 Units Subcutaneous Q2200  . ipratropium-albuterol  3 mL Nebulization TID  . methylPREDNISolone (SOLU-MEDROL) injection  60 mg Intravenous Daily  .  mometasone-formoterol  2 puff Inhalation BID  . sertraline  100 mg Oral Daily  . sodium chloride flush  3 mL Intravenous Q12H    Dialysis Orders: MWF at [facility] - full HD 10/22, left 1kg up 3:45hr, 400/600, EDW 56kg, 2K/2Ca, AVG, no heparin - Mircera 46mcg IV q 4 weeks - Calcitriol 1.64mcg PO q HD - Venofer 50mg  IV weekly  Assessment/Plan: 1.  RLL pneumonia: BCx drawn, started on Vanc/Cefepime/Doxy + steroids + duo-nebs. Hx COPD. 2.  Acute/ chronic hypoxic Resp Failure: much better, on 2L [place] now and coughing sig better.   3.  ^ Troponin: ?demand ischemia - per primary team. 4.  ESRD: MWF HD. Had HD yest, plan HD today to get back on sched 5.  Hypertension/volume: BP high - no edema on exam. Below dry wt. 6.  Anemia of ESRD: Hgb 9.5 today - follow. 7.  Metabolic bone disease: CorrCa high, Phos high. Resume home binders once eating, holding VDRA for now. 8.  T2DM 9.  CAD/Hx stents 10.  Hx CVA 11. COPD/Former heavy tobacco use    Kelly Splinter  MD 12/02/2019, 2:19 PM

## 2019-12-02 NOTE — NC FL2 (Signed)
[place] LEVEL OF CARE SCREENING TOOL     IDENTIFICATION  Patient Name: Jeffery Young Birthdate: [birth year] Sex: male Admission Date (Current Location): 11/30/2019  [place] and [place] Number:  Kathleen Argue [phone] [place] and Address:  [facility] [place], [place] [address], [place], [place] [address]      Provider Number: [id number]  Attending Physician Name and Address:  McDiarmid, Blane Ohara, MD  Relative Name and Phone Number:  Mishael, Haran [phone]    Current Level of Care: Hospital Recommended Level of Care: [place] Prior Approval Number:    Date Approved/Denied:   PASRR Number: [id number]  Discharge Plan: SNF    Current Diagnoses: Patient Active Problem List   Diagnosis Date Noted  . Acute respiratory failure with hypoxemia ([place]) 12/01/2019  . Metabolic bone disease 00/93/8182  . Lives in long-term care facility 12/01/2019  . Delirium due to general medical condition 12/01/2019  . Chronic hypercapnic respiratory failure ([place]) 12/01/2019  . Hyperlipidemia associated with type 2 diabetes mellitus ([place]) 12/01/2019  . HCAP (healthcare-associated pneumonia)   . COPD GOLD III  10/24/2017  . CAD (coronary artery disease), native coronary artery 10/10/2017  . ESRD (end stage renal disease) on dialysis ([place]) 06/25/2017  . Depression 05/28/2017  . Anemia of renal disease 05/28/2017  . History of completed stroke 05/28/2017  . Chronic combined systolic and diastolic heart failure ([place])   . Status post coronary artery stent placement   . Type 2 diabetes mellitus with diabetic nephropathy, without long-term current use of insulin ([place])   . Essential hypertension     Orientation RESPIRATION BLADDER Height & Weight     Self, Time, Situation, Place  O2 Continent Weight: 115 lb 4.8 oz (52.3 kg) Height:  5\' 5"  (165.1 cm)  BEHAVIORAL SYMPTOMS/MOOD NEUROLOGICAL BOWEL NUTRITION STATUS      Continent Diet (Current NPO.   See discharge summary)  AMBULATORY STATUS COMMUNICATION OF NEEDS Skin   Limited Assist Verbally Normal                       Personal Care Assistance Level of Assistance  Bathing, Feeding, Dressing Bathing Assistance: Limited assistance Feeding assistance: Independent Dressing Assistance: Limited assistance     Functional Limitations Info  Sight, Hearing, Speech Sight Info: Adequate Hearing Info: Adequate Speech Info: Adequate    SPECIAL CARE FACTORS FREQUENCY  PT (By licensed PT), OT (By licensed OT), Speech therapy     PT Frequency: 5x week OT Frequency: 5x week     Speech Therapy Frequency: 2x week      Contractures Contractures Info: Not present    Additional Factors Info  Code Status, Allergies, Insulin Sliding Scale Code Status Info: full Allergies Info: NKA   Insulin Sliding Scale Info: Novolog 0-9 units 3x day with meals       Current Medications (12/02/2019):  This is the current hospital active medication list Current Facility-Administered Medications  Medication Dose Route Frequency Provider Last Rate Last Admin  . acetaminophen (TYLENOL) tablet 650 mg  650 mg Oral Q6H PRN Ganta, Anupa, DO       Or  . acetaminophen (TYLENOL) suppository 650 mg  650 mg Rectal Q6H PRN Ganta, Anupa, DO      . aspirin chewable tablet 81 mg  81 mg Oral Daily Ganta, Anupa, DO   81 mg at 12/02/19 1110  . atorvastatin (LIPITOR) tablet 10 mg  10 mg Oral Daily Ganta, Anupa, DO   10 mg  at 12/02/19 1110  . carvedilol (COREG) tablet 6.25 mg  6.25 mg Oral BID WC Brimage, Vondra, DO      . ceFEPIme (MAXIPIME) 1 g in sodium chloride 0.9 % 100 mL IVPB  1 g Intravenous Q24H Benay Pike, MD 200 mL/hr at 12/02/19 1226 1 g at 12/02/19 1226  . Chlorhexidine Gluconate Cloth 2 % PADS 6 each  6 each Topical Q0600 Loren Racer, PA-C   6 each at 12/02/19 0536  . doxycycline (VIBRAMYCIN) 100 mg in sodium chloride 0.9 % 250 mL IVPB  100 mg Intravenous Q12H Christy Gentles, RPH    Stopped at 12/02/19 1107  . [START ON 12/04/2019] heparin injection 5,000 Units  5,000 Units Subcutaneous Q8H Monia Sabal, PA-C      . insulin aspart (novoLOG) injection 0-9 Units  0-9 Units Subcutaneous TID WC Ganta, Anupa, DO   5 Units at 12/02/19 1227  . insulin detemir (LEVEMIR) injection 10 Units  10 Units Subcutaneous Q2200 Brimage, Vondra, DO   10 Units at 12/01/19 2226  . ipratropium-albuterol (DUONEB) 0.5-2.5 (3) MG/3ML nebulizer solution 3 mL  3 mL Nebulization TID McDiarmid, Blane Ohara, MD   3 mL at 12/02/19 1505  . ipratropium-albuterol (DUONEB) 0.5-2.5 (3) MG/3ML nebulizer solution 3 mL  3 mL Nebulization Q4H PRN McDiarmid, Blane Ohara, MD      . methylPREDNISolone sodium succinate (SOLU-MEDROL) 125 mg/2 mL injection 60 mg  60 mg Intravenous Daily Zola Button, MD   60 mg at 12/02/19 0825  . mometasone-formoterol (DULERA) 200-5 MCG/ACT inhaler 2 puff  2 puff Inhalation BID McDiarmid, Blane Ohara, MD   2 puff at 12/02/19 1113  . polyethylene glycol (MIRALAX / GLYCOLAX) packet 17 g  17 g Oral Daily PRN Ganta, Anupa, DO      . Resource ThickenUp Clear   Oral PRN McDiarmid, Blane Ohara, MD      . sertraline (ZOLOFT) tablet 100 mg  100 mg Oral Daily Ganta, Anupa, DO   100 mg at 12/02/19 1110  . sodium chloride flush (NS) 0.9 % injection 3 mL  3 mL Intravenous Q12H Ganta, Anupa, DO   3 mL at 12/02/19 0825     Discharge Medications: Please see discharge summary for a list of discharge medications.  Relevant Imaging Results:  Relevant Lab Results:   Additional Information SSN: [id number]  Joanne Chars, LCSW

## 2019-12-02 NOTE — Progress Notes (Signed)
Physical Therapy Treatment Patient Details Name: Jeffery Young MRN: [id number] DOB: November 11, 1965 Today's Date: 12/02/2019    History of Present Illness Pt is a 54 y.o. male admitted from [facility] facility on 11/30/19 with worsening dyspnea. Found to have RLL PNA. Chest angio 10/26 negative for acute PE; findingsare consistent with infectious bronchiolitis or aspiration. 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   PT Comments    Pt progressing with mobility. Today's session focused on transfer training and strengthening; pt requiring up to [facility] for mobility with RW. Pt easily distracted and stating multiple times, "I want to eat," requiring frequent redirection to participate. Overall, pleasant and agreeable despite this. Pt still unable to provide any further details regarding the "program" he states he's from/where he lives - unable to say if it's SNF/LTC, if he lives alone or what assist he has available. Continue to recommend SNF-level therapies to maximize functional mobility and independence.    Follow Up Recommendations  SNF;Supervision for mobility/OOB     Equipment Recommendations   (defer)    Recommendations for Other Services       Precautions / Restrictions Precautions Precautions: Fall;Other (comment) Precaution Comments: H/o stroke with chronic L-side weakness; h/o bladder/bowel incontinence (pt reports typically wears diaper) Restrictions Weight Bearing Restrictions: No    Mobility  Bed Mobility Overal bed mobility: Needs Assistance Bed Mobility: Supine to Sit     Supine to sit: Min assist;HOB elevated     General bed mobility comments: MinA for HHA to elevate trunk  Transfers Overall transfer level: Needs assistance Equipment used: Rolling walker (2 wheeled) Transfers: Sit to/from Stand Sit to Stand: Min assist         General transfer  comment: Multiple sit<>stands from EOB and recliner, cues for hand placement as pt attempting to pull with UE support from therapist; consistent minA to maintain balance with transition of UE support to/from RW; poor eccentric control into sitting but able to improve with verbal cues  Ambulation/Gait Ambulation/Gait assistance: Min assist;Mod assist Gait Distance (Feet): 8 Feet Assistive device: Rolling walker (2 wheeled) Gait Pattern/deviations: Step-to pattern;Decreased weight shift to left;Decreased dorsiflexion - left;Trunk flexed Gait velocity: Decreased   General Gait Details: Slow, unsteady gait to sink with RW and frequent minA to maintain balance; pt keeping L knee in partially flexed position with decreased weight shift to LLE; intermittent modA with static standing for balance; required seated rest at sink before completed ADL tasks   Stairs             Wheelchair Mobility    Modified Rankin (Stroke Patients Only)       Balance Overall balance assessment: Needs assistance   Sitting balance-Leahy Scale: Fair       Standing balance-Leahy Scale: Poor Standing balance comment: Reliant on single UE support and external assist to maintain balance with ADL tasks at sink; forward flexed posture with L knee flexion                            Cognition Arousal/Alertness: Awake/alert Behavior During Therapy: Flat affect Overall Cognitive Status: No family/caregiver present to determine baseline cognitive functioning Area of Impairment: Attention;Following commands;Safety/judgement;Problem solving                   Current Attention Level: Sustained;Selective   Following Commands: Follows one step commands consistently;Follows one step  commands with increased time Safety/Judgement: Decreased awareness of deficits;Decreased awareness of safety   Problem Solving: Decreased initiation;Requires verbal cues;Difficulty sequencing General Comments:  Answering majority of questions and following simple commands appropriately. Perseverating on "I'm hungry... I want food" despite reorientation to thickened liquids diet per SLP. Pt asked more details about "program" he reports he's from, but unable to answer any details or clarify set-up (i.e. SNF, one level, assist available...)      Exercises General Exercises - Lower Extremity Long Arc Quad: AROM;Right;AAROM;Left;Seated Other Exercises Other Exercises: LLE hamstring stretching (AAROM) with forward lean from chair, pt with difficulty achieving full knee extension due to muscle tightness Other Exercises: 5x repeated sit<>stand from recliner (heavy reliance on UE support, minA)    General Comments General comments (skin integrity, edema, etc.): SpO2 90-92% on 2L O2 [place] with activity; wet-sounding cough but pt unable to produce secretions. Alerted SLP of pt's request for coffee - SLP to provide      Pertinent Vitals/Pain Pain Assessment: No/denies pain Faces Pain Scale: No hurt Pain Intervention(s): Monitored during session    Home Living                      Prior Function            PT Goals (current goals can now be found in the care plan section) Acute Rehab PT Goals Patient Stated Goal: "I want to eat" Progress towards PT goals: Progressing toward goals    Frequency    Min 3X/week      PT Plan Current plan remains appropriate    Co-evaluation              AM-PAC PT "6 Clicks" Mobility   Outcome Measure  Help needed turning from your back to your side while in a flat bed without using bedrails?: A Little Help needed moving from lying on your back to sitting on the side of a flat bed without using bedrails?: A Little Help needed moving to and from a bed to a chair (including a wheelchair)?: A Little Help needed standing up from a chair using your arms (e.g., wheelchair or bedside chair)?: A Little Help needed to walk in hospital room?: A Lot Help  needed climbing 3-5 steps with a railing? : A Lot 6 Click Score: 16    End of Session Equipment Utilized During Treatment: Gait belt;Oxygen Activity Tolerance: Patient tolerated treatment well;Patient limited by fatigue Patient left: in chair;with call bell/phone within reach;with chair alarm set Nurse Communication: Mobility status PT Visit Diagnosis: Other abnormalities of gait and mobility (R26.89);Muscle weakness (generalized) (M62.81)     Time: 1030-1053 PT Time Calculation (min) (ACUTE ONLY): 23 min  Charges:  $Therapeutic Exercise: 8-22 mins $Therapeutic Activity: 8-22 mins                     Mabeline Caras, PT, DPT Acute Rehabilitation Services  Pager [phone] Office [place] 12/02/2019, 12:16 PM

## 2019-12-02 NOTE — Progress Notes (Signed)
Patient attempted to get out of bed multiple times, continuously throughout night. Easily re-directed but restlessness was evident. No c/o pain, currently patient is asleep in bed.

## 2019-12-02 NOTE — TOC Initial Note (Signed)
Transition of Care [facility]) - Initial/Assessment Note    Patient Details  Name: Jeffery Young MRN: [id number] Date of Birth: Sep 11, 1965  [facility] CM/SW Contact:    Joanne Chars, LCSW Phone Number: 12/02/2019, 3:14 PM  Clinical Narrative:   CSW met with pt to discuss discharge recommendation for SNF.  Pt current long term resident at [facility], would like to return to their rehab side.  Permission given to speak to [place] and to wife and children.  Pt is vaccinated.  CSW spoke to [place] at [facility] and pt can come to rehab side upon discharge from the hospital.                 Expected Discharge Plan: Skilled Nursing Facility Barriers to Discharge: Insurance Authorization, Continued Medical Work up   Patient Goals and CMS Choice Patient states their goals for this hospitalization and ongoing recovery are:: get back to facility CMS Medicare.gov Compare Post Acute Care list provided to::  (pt is from [facility] and wants to return)    Expected Discharge Plan and Services Expected Discharge Plan: [place] Choice: [place] Living arrangements for the past 2 months: [place]                                      Prior Living Arrangements/Services Living arrangements for the past 2 months: [place] Lives with:: Facility Resident [facility]) Patient language and need for interpreter reviewed:: Yes Do you feel safe going back to the place where you live?: Yes      Need for Family Participation in Patient Care: Yes (Comment) Care giver support system in place?: Yes (comment)   Criminal Activity/Legal Involvement Pertinent to Current Situation/Hospitalization: No - Comment as needed  Activities of Daily Living      Permission Sought/Granted Permission sought to share information with : Facility Sport and exercise psychologist, Family Supports Permission granted to share  information with : Yes, Verbal Permission Granted  Share Information with NAME: wife Marval Regal, children  Permission granted to share info w AGENCY: [facility]        Emotional Assessment Appearance:: Appears older than stated age Attitude/Demeanor/Rapport: Engaged Affect (typically observed): Appropriate Orientation: : Oriented to Self, Oriented to Place, Oriented to  Time, Oriented to Situation Alcohol / Substance Use: Not Applicable Psych Involvement: No (comment)  Admission diagnosis:  Pneumonia [J18.9] HCAP (healthcare-associated pneumonia) [J18.9] Patient Active Problem List   Diagnosis Date Noted  . Acute respiratory failure with hypoxemia ([place]) 12/01/2019  . Metabolic bone disease 73/53/2992  . Lives in long-term care facility 12/01/2019  . Delirium due to general medical condition 12/01/2019  . Chronic hypercapnic respiratory failure ([place]) 12/01/2019  . Hyperlipidemia associated with type 2 diabetes mellitus ([place]) 12/01/2019  . HCAP (healthcare-associated pneumonia)   . COPD GOLD III  10/24/2017  . CAD (coronary artery disease), native coronary artery 10/10/2017  . ESRD (end stage renal disease) on dialysis ([place]) 06/25/2017  . Depression 05/28/2017  . Anemia of renal disease 05/28/2017  . History of completed stroke 05/28/2017  . Chronic combined systolic and diastolic heart failure ([place])   . Status post coronary artery stent placement   . Type 2 diabetes mellitus with diabetic nephropathy, without long-term current use of insulin ([place])   . Essential hypertension    PCP:  [name], MD Pharmacy:   [facility]  [place] [place] [address] [place] [address] Phone: [phone] Fax: [phone]     Social Determinants of Health (SDOH) Interventions    Readmission Risk Interventions No flowsheet data found.

## 2019-12-02 NOTE — Consult Note (Signed)
Chief Complaint: Patient was seen in consultation today for left upper arm dialysis graft evaluation and intervention Chief Complaint  Patient presents with  . Shortness of Breath   at the request of Dr Mickel Crow   Supervising Physician: Dr Ruthann Cancer  Patient Status: [facility] - In-pt  History of Present Illness: Jeffery Young is a 54 y.o. male   Hx HTN; DM; CAD; COPD; CVA Lives in [facility] facility Being treated for PNA-still with cough Acute on chronic resp failure ESRD LUE dialysis graft clotted in dialysis today Nephrology requesting declot  Scheduled now for same in IR tomorrow am   Past Medical History:  Diagnosis Date  . Anemia of renal disease 05/28/2017  . Chest pain 01/10/2016  . Chronic combined systolic and diastolic heart failure ([place])   . Coronary artery disease    a.12/22/15: NSTEMI s/p overlapping DES x2 and balloon angioplasty to distal AV groove Circumflex (too small for a stent).   . Coronary artery disease due to lipid rich plaque 01/10/2016  . Dehydration 08/22/2012  . Depression   . Elevated troponin   . ESRD (end stage renal disease) on dialysis ([place])   . Essential hypertension   . History of completed stroke 05/28/2017  . Hypercholesteremia   . Hypertension   . Hypertensive heart disease with heart failure ([place])   . Ischemic cardiomyopathy 03/13/2016  . Normocytic anemia 05/28/2017  . NSTEMI (non-ST elevated myocardial infarction) ([place]) 12/22/2015  . Prostate infection   . Status post coronary artery stent placement   . Syncope 03/13/2016  . Tobacco abuse   . Type 2 diabetes mellitus with diabetic nephropathy, without long-term current use of insulin ([place])   . Type II diabetes mellitus ([place])   . Uncontrolled hypertension 05/28/2017  . Unstable angina pectoris ([place]) 01/11/2016    Past Surgical History:  Procedure Laterality Date  . BASCILIC VEIN TRANSPOSITION Left 06/28/2017   Procedure: LEFT UPPER ARM ARTERIOVENOUS GORTEX-GRAFT PLACEMENT;  Surgeon:  Angelia Mould, MD;  Location: [place];  Service: Vascular;  Laterality: Left;  . CARDIAC CATHETERIZATION N/A 12/22/2015   Procedure: Left Heart Cath and Coronary Angiography;  Surgeon: Burnell Blanks, MD;  Location: [place] [facility];  Service: Cardiovascular;  Laterality: N/A;  . CARDIAC CATHETERIZATION N/A 12/22/2015   Procedure: Coronary Stent Intervention;  Surgeon: Burnell Blanks, MD;  Location: [place] [facility];  Service: Cardiovascular;  Laterality: N/A;  . CARDIAC CATHETERIZATION N/A 01/11/2016   Procedure: Left Heart Cath and Coronary Angiography;  Surgeon: Sherren Mocha, MD;  Location: [place] [facility];  Service: Cardiovascular;  Laterality: N/A;  . CARDIAC CATHETERIZATION N/A 01/11/2016   Procedure: Intravascular Pressure Wire/FFR Study;  Surgeon: Sherren Mocha, MD;  Location: [place] [facility];  Service: Cardiovascular;  Laterality: N/A;  . CARDIAC CATHETERIZATION N/A 01/11/2016   Procedure: Coronary Stent Intervention;  Surgeon: Sherren Mocha, MD;  Location: [place] [facility];  Service: Cardiovascular;  Laterality: N/A;  . CORONARY ANGIOPLASTY WITH STENT PLACEMENT  12/22/2015  . INSERTION OF DIALYSIS CATHETER Right 06/28/2017   Procedure: INSERTION OF DIALYSIS CATHETER RIGHT INTERNAL JUGULAR;  Surgeon: Angelia Mould, MD;  Location: [place];  Service: Vascular;  Laterality: Right;    Allergies: Patient has no known allergies.  Medications: Prior to Admission medications   Medication Sig Start Date End Date Taking? Authorizing Provider  albuterol (VENTOLIN HFA) 108 (90 Base) MCG/ACT inhaler Inhale 2 puffs into the lungs every 6 (six) hours as needed for wheezing or shortness  of breath.   Yes [provider]  amoxicillin-clavulanate (AUGMENTIN) 500-125 MG tablet Take 1 tablet by mouth at bedtime.   Yes [provider]  aspirin 81 MG chewable tablet CHEW ONE TABLET BY MOUTH DAILY Patient taking differently: Chew 81 mg by mouth  daily.  04/22/19  Yes [name], MD  atorvastatin (LIPITOR) 10 MG tablet Take 1 tablet (10 mg total) by mouth daily. Please call to schedule appointment for future refills. Patient taking differently: Take 10 mg by mouth daily.  08/11/19  Yes Imogene Burn, PA-C  calcitRIOL (ROCALTROL) 0.5 MCG capsule TAKE 1 CAPSULE BY MOUTH ON MONDAY, WEDNESDAY, AND FRIDAY WITH HEMODIALYSIS Patient taking differently: Take 0.5 mcg by mouth See admin instructions. Take 1 capsule (0.5 mcg) by mouth every Mon, Wed, and Friday 03/17/18  Yes [name], MD  carvedilol (COREG) 6.25 MG tablet TAKE ONE TABLET BY MOUTH TWICE A DAY WITH A MEAL Patient taking differently: Take 6.25 mg by mouth 2 (two) times daily with a meal.  07/07/19  Yes Imogene Burn, PA-C  clopidogrel (PLAVIX) 75 MG tablet Take 1 tablet (75 mg total) by mouth daily. Please make overdue appt with Dr. Angelena Form before anymore refills. 1st attempt Patient taking differently: Take 75 mg by mouth daily.  10/13/19  Yes Burnell Blanks, MD  docusate sodium (COLACE) 100 MG capsule Take 100 mg by mouth 2 (two) times daily.   Yes [provider]  Fluticasone-Salmeterol (ADVAIR DISKUS IN) Inhale 1 puff into the lungs 2 (two) times daily.    Yes [provider]  guaiFENesin (MUCINEX) 600 MG 12 hr tablet Take 600 mg by mouth 2 (two) times daily.    Yes [provider]  nitroGLYCERIN (NITROSTAT) 0.4 MG SL tablet Place 1 tablet (0.4 mg total) under the tongue every 5 (five) minutes x 3 doses as needed for chest pain. Patient taking differently: Place 0.4 mg under the tongue every 5 (five) minutes as needed for chest pain (max 3 doses).  12/26/15  Yes Cheryln Manly, NP  predniSONE (DELTASONE) 20 MG tablet Take 20 mg by mouth daily with breakfast.   Yes [provider]  Probiotic Product (PROBIOTIC ACIDOPHILUS BEADS PO) Take 1 capsule by mouth 2 (two) times daily.   Yes [provider]  sertraline  (ZOLOFT) 100 MG tablet TAKE ONE TABLET BY MOUTH DAILY Patient taking differently: Take 100 mg by mouth daily.  06/09/19  Yes [name], MD  sevelamer carbonate (RENVELA) 800 MG tablet Take 1 tablet (800 mg total) by mouth 3 (three) times daily with meals. Patient taking differently: Take 2,400 mg by mouth 3 (three) times daily with meals.  08/15/17  Yes [name], MD  traZODone (DESYREL) 50 MG tablet TAKE ONE TABLET BY MOUTH EVERY NIGHT AT BEDTIME Patient taking differently: Take 50 mg by mouth at bedtime.  11/07/19  Yes [name], MD  umeclidinium bromide (INCRUSE ELLIPTA) 62.5 MCG/INH AEPB Inhale 1 puff into the lungs daily.    Yes [provider]  B Complex-C-Folic Acid (RENA-VITE RX) 1 MG TABS TAKE ONE TABLET BY MOUTH AT BEDTIME Patient not taking: Reported on 11/30/2019 11/19/19   [name], MD  fluticasone [facility]) 50 MCG/ACT nasal spray Place 1-2 sprays into both nostrils daily. Patient not taking: Reported on 11/30/2019 03/05/19   [name], MD  glucose blood [facility] VERIO) test strip 1 each by Other route as needed for other. Use as instructed 1-2  times per day. 04/24/18   [name], MD  Lancets (ONETOUCH DELICA PLUS [id number]) [place] USE ONELANCET TO TEST - FOUR TIMES A DAY 08/29/18   [name], MD  STOOL SOFTENER 100 MG capsule TAKE ONE CAPSULE BY MOUTH TWICE A DAY Patient not taking: Reported on 11/30/2019 06/09/19   [name], MD     Family History  Problem Relation Age of Onset  . CAD Brother   . CVA Brother   . Chronic Renal Failure Brother 50       on HD  . Hypertension Mother   . Diabetes Mother   . Rheum arthritis Mother   . Healthy Daughter   . Healthy Son     Social History   Socioeconomic History  . Marital status: Married    Spouse name: Not on file  . Number of children: Not on file  . Years of education: Not on file  . Highest education level: Not on file  Occupational History  .  Occupation: International aid/development worker: [employer]  Tobacco Use  . Smoking status: Former Smoker    Packs/day: 0.75    Years: 29.00    Pack years: 21.75    Types: Cigarettes    Quit date: 05/28/2017    Years since quitting: 2.5  . Smokeless tobacco: Never Used  Vaping Use  . Vaping Use: Never used  Substance and Sexual Activity  . Alcohol use: Not Currently    Alcohol/week: 7.0 standard drinks    Types: 7 Cans of beer per week    Comment: last drink was prior to last hospitalization  . Drug use: No  . Sexual activity: Not Currently  Other Topics Concern  . Not on file  Social History Narrative  . Not on file   Social Determinants of Health   Financial Resource Strain:   . Difficulty of Paying Living Expenses: Not on file  Food Insecurity:   . Worried About Charity fundraiser in the Last Year: Not on file  . Ran Out of Food in the Last Year: Not on file  Transportation Needs:   . Lack of Transportation (Medical): Not on file  . Lack of Transportation (Non-Medical): Not on file  Physical Activity:   . Days of Exercise per Week: Not on file  . Minutes of Exercise per Session: Not on file  Stress:   . Feeling of Stress : Not on file  Social Connections:   . Frequency of Communication with Friends and Family: Not on file  . Frequency of Social Gatherings with Friends and Family: Not on file  . Attends Religious Services: Not on file  . Active Member of Clubs or Organizations: Not on file  . Attends Archivist Meetings: Not on file  . Marital Status: Not on file    Review of Systems: A 12 point ROS discussed and pertinent positives are indicated in the HPI above.  All other systems are negative.  Review of Systems  Constitutional: Positive for activity change, appetite change and fatigue. Negative for fever.  Respiratory: Positive for cough, shortness of breath and wheezing.   Neurological: Positive for weakness.  Psychiatric/Behavioral: Negative for  behavioral problems and confusion.    Vital Signs: BP 132/66 (BP Location: Right Arm)   Pulse 65   Temp 98 F (36.7 C)   Resp 17   Ht 5\' 5"  (1.651 m)   Wt 115 lb 4.8 oz (52.3 kg)  SpO2 97%   BMI 19.19 kg/m   Physical Exam Cardiovascular:     Rate and Rhythm: Normal rate and regular rhythm.  Pulmonary:     Effort: No respiratory distress.     Breath sounds: Wheezing and rhonchi present.  Abdominal:     Palpations: Abdomen is soft.  Musculoskeletal:        General: Normal range of motion.     Comments: LUE dialysis graft No thrill No pulses  Skin:    General: Skin is warm.  Neurological:     Mental Status: He is alert and oriented to person, place, and time.  Psychiatric:        Behavior: Behavior normal.     Imaging: CT ANGIO CHEST PE W OR WO CONTRAST  Result Date: 12/01/2019 CLINICAL DATA:  Chest pain.  Shortness of breath. EXAM: CT ANGIOGRAPHY CHEST WITH CONTRAST TECHNIQUE: Multidetector CT imaging of the chest was performed using the standard protocol during bolus administration of intravenous contrast. Multiplanar CT image reconstructions and MIPs were obtained to evaluate the vascular anatomy. CONTRAST:  12mL OMNIPAQUE IOHEXOL 350 MG/ML SOLN COMPARISON:  None. FINDINGS: Cardiovascular: Contrast injection is sufficient to demonstrate satisfactory opacification of the pulmonary arteries to the segmental level. There is no pulmonary embolus or evidence of right heart strain. The size of the main pulmonary artery is normal. Normal heart size with coronary artery calcification. Mild atherosclerotic changes are noted of the thoracic aorta without evidence for an aneurysm or dissection. There is no significant pericardial effusion. Mediastinum/Nodes: --there are mildly enlarged mediastinal lymph nodes. For example there is a precarinal lymph node measuring 1.2 cm (axial series 5, image 72). --there are mildly enlarged hilar lymph nodes bilaterally. For example there is a right  hilar lymph node measuring 1.1 cm (axial series 5, image 82). -- No axillary lymphadenopathy. -- No supraclavicular lymphadenopathy. -- Normal thyroid gland where visualized. -  Unremarkable esophagus. Lungs/Pleura: There are diffuse bilateral tree-in-bud airspace opacities with areas of bronchial wall thickening and significant mucous plugging, especially in the lower lobes. There is debris within the trachea and right mainstem bronchus. There is an area of consolidation within the right lower lobe measuring approximately 4.8 cm. There is no pneumothorax. No large pleural effusion. Upper Abdomen: Contrast bolus timing is not optimized for evaluation of the abdominal organs. The visualized portions of the organs of the upper abdomen are normal. Musculoskeletal: No chest wall abnormality. No bony spinal canal stenosis. Review of the MIP images confirms the above findings. IMPRESSION: 1. No acute pulmonary embolism. 2. Diffuse bilateral tree-in-bud airspace opacities with areas of bronchial wall thickening and significant mucous plugging, especially in the lower lobes. There is an area of consolidation within the right lower lobe measuring approximately 4.8 cm. Findings are consistent with infectious bronchiolitis or aspiration. Follow-up to radiologic resolution for the area of consolidation in the right lower lobe is recommended. 3. Mediastinal and hilar adenopathy, likely reactive. 4. Coronary artery disease. Aortic Atherosclerosis (ICD10-I70.0). Electronically Signed   By: Constance Holster M.D.   On: 12/01/2019 15:16   DG Chest Portable 1 View  Result Date: 11/30/2019 CLINICAL DATA:  Shortness of breath EXAM: PORTABLE CHEST 1 VIEW COMPARISON:  03/19/2019 FINDINGS: The heart size and mediastinal contours are within normal limits. Dense airspace consolidation within the medial aspect of the right lung base compatible with pneumonia. Left lung is clear. No pleural effusion or pneumothorax. The visualized  skeletal structures are unremarkable. IMPRESSION: Right lower lobe pneumonia. Recommend radiographic follow-up to  resolution. Electronically Signed   By: Davina Poke D.O.   On: 11/30/2019 12:03   DG Swallowing Func-Speech Pathology  Result Date: 12/02/2019 Objective Swallowing Evaluation: Type of Study: MBS-Modified Barium Swallow Study  Patient Details Name: Laquentin Loudermilk MRN: [id number] Date of Birth: Dec 25, 1965 Today's Date: 12/02/2019 Time: SLP Start Time (ACUTE ONLY): 0109 -SLP Stop Time (ACUTE ONLY): 0927 SLP Time Calculation (min) (ACUTE ONLY): 23 min Past Medical History: Past Medical History: Diagnosis Date . Anemia of renal disease 05/28/2017 . Chest pain 01/10/2016 . Chronic combined systolic and diastolic heart failure ([place])  . Coronary artery disease   a.12/22/15: NSTEMI s/p overlapping DES x2 and balloon angioplasty to distal AV groove Circumflex (too small for a stent).  . Coronary artery disease due to lipid rich plaque 01/10/2016 . Dehydration 08/22/2012 . Depression  . Elevated troponin  . ESRD (end stage renal disease) on dialysis ([place])  . Essential hypertension  . History of completed stroke 05/28/2017 . Hypercholesteremia  . Hypertension  . Hypertensive heart disease with heart failure ([place])  . Ischemic cardiomyopathy 03/13/2016 . Normocytic anemia 05/28/2017 . NSTEMI (non-ST elevated myocardial infarction) ([place]) 12/22/2015 . Prostate infection  . Status post coronary artery stent placement  . Syncope 03/13/2016 . Tobacco abuse  . Type 2 diabetes mellitus with diabetic nephropathy, without long-term current use of insulin ([place])  . Type II diabetes mellitus ([place])  . Uncontrolled hypertension 05/28/2017 . Unstable angina pectoris ([place]) 01/11/2016 Past Surgical History: Past Surgical History: Procedure Laterality Date . BASCILIC VEIN TRANSPOSITION Left 06/28/2017  Procedure: LEFT UPPER ARM ARTERIOVENOUS GORTEX-GRAFT PLACEMENT;  Surgeon: Angelia Mould, MD;  Location: [place];  Service: Vascular;   Laterality: Left; . CARDIAC CATHETERIZATION N/A 12/22/2015  Procedure: Left Heart Cath and Coronary Angiography;  Surgeon: Burnell Blanks, MD;  Location: [place] [facility];  Service: Cardiovascular;  Laterality: N/A; . CARDIAC CATHETERIZATION N/A 12/22/2015  Procedure: Coronary Stent Intervention;  Surgeon: Burnell Blanks, MD;  Location: [place] [facility];  Service: Cardiovascular;  Laterality: N/A; . CARDIAC CATHETERIZATION N/A 01/11/2016  Procedure: Left Heart Cath and Coronary Angiography;  Surgeon: Sherren Mocha, MD;  Location: [place] [facility];  Service: Cardiovascular;  Laterality: N/A; . CARDIAC CATHETERIZATION N/A 01/11/2016  Procedure: Intravascular Pressure Wire/FFR Study;  Surgeon: Sherren Mocha, MD;  Location: [place] [facility];  Service: Cardiovascular;  Laterality: N/A; . CARDIAC CATHETERIZATION N/A 01/11/2016  Procedure: Coronary Stent Intervention;  Surgeon: Sherren Mocha, MD;  Location: [place] [facility];  Service: Cardiovascular;  Laterality: N/A; . CORONARY ANGIOPLASTY WITH STENT PLACEMENT  12/22/2015 . INSERTION OF DIALYSIS CATHETER Right 06/28/2017  Procedure: INSERTION OF DIALYSIS CATHETER RIGHT INTERNAL JUGULAR;  Surgeon: Angelia Mould, MD;  Location[facility];  Service: Vascular;  Laterality: Right; HPI: 54 yr old admitted with worsening dyspnea, afebrile and productive cough from [facility], Per chart O2 sat noted to be 55% on room air. COVID negative. Found to have CAP. PMH: basal ganglia/pons stroke 2019, ESRD, HTN, DM, NSTEMI, CAD. CXR RRL pna. Outpatient MBS 10/08/19 >pt reported hx of dysphagia in 2019. Found to have delayed pharyngeal swallow with penetration (PAS 3) of large boluses. Reg/thin recommended. Pt admitted to fast rate of eating.  No data recorded Assessment / Plan / Recommendation CHL IP CLINICAL IMPRESSIONS 12/02/2019 Clinical Impression Pt has a history of oropharyngeal dysphagia following stroke in 2019 and continued during  today's study. Significant pharyngeal congestion noted yesterday during BSE however today respirations subjectively clear without audible congestion. Oral  impairments are mild and marked by difficulty coordinating and timing propulsion. Motor strength was within normal limits. Timing is delayed in regards to reflexive onset of laryngeal closure resulting in barium reaching and remaining in valleculae for 7 seconds and pyriform sinuses up to 10 seconds. His pyriform sinuses filled leading to barium spilling into laryngeal vestibule and vocal cords during swallow. The thicker the consistency, the less barium able to reach lower pharynx. Verbal cues given to propel and attempt to initiate swallow faster without significant difference and chick tuck posture ineffective strategy. Sensation and awareness of vocal cord penetration was inconsistent and decreased. Honey thick texture better coordinated, barium upper pharynx when initiated and no pharyngeal residue throughout this study. Recommended honey thick liquids, small sips, Dys 3 texture, volitional cough, crush meds and full supervision.      SLP Visit Diagnosis Dysphagia, oropharyngeal phase (R13.12) Attention and concentration deficit following -- Frontal lobe and executive function deficit following -- Impact on safety and function Moderate aspiration risk   CHL IP TREATMENT RECOMMENDATION 12/02/2019 Treatment Recommendations Therapy as outlined in treatment plan below   Prognosis 12/02/2019 Prognosis for Safe Diet Advancement (No Data) Barriers to Reach Goals Cognitive deficits Barriers/Prognosis Comment -- CHL IP DIET RECOMMENDATION 12/02/2019 SLP Diet Recommendations Dysphagia 3 (Mech soft) solids;Honey thick liquids Liquid Administration via Cup Medication Administration Crushed with puree Compensations Minimize environmental distractions;Slow rate;Small sips/bites;Lingual sweep for clearance of pocketing;Clear throat intermittently Postural Changes Seated  upright at 90 degrees   CHL IP OTHER RECOMMENDATIONS 12/02/2019 Recommended Consults -- Oral Care Recommendations Oral care BID Other Recommendations Order thickener from pharmacy   CHL IP FOLLOW UP RECOMMENDATIONS 12/02/2019 Follow up Recommendations Skilled Nursing facility   [facility] IP FREQUENCY AND DURATION 12/02/2019 Speech Therapy Frequency (ACUTE ONLY) min 2x/week Treatment Duration 2 weeks      CHL IP ORAL PHASE 12/02/2019 Oral Phase Impaired Oral - Pudding Teaspoon -- Oral - Pudding Cup -- Oral - Honey Teaspoon -- Oral - Honey Cup Reduced posterior propulsion Oral - Nectar Teaspoon -- Oral - Nectar Cup Reduced posterior propulsion Oral - Nectar Straw -- Oral - Thin Teaspoon -- Oral - Thin Cup -- Oral - Thin Straw -- Oral - Puree WFL Oral - Mech Soft -- Oral - Regular WFL Oral - Multi-Consistency -- Oral - Pill -- Oral Phase - Comment --  CHL IP PHARYNGEAL PHASE 12/02/2019 Pharyngeal Phase Impaired Pharyngeal- Pudding Teaspoon -- Pharyngeal -- Pharyngeal- Pudding Cup -- Pharyngeal -- Pharyngeal- Honey Teaspoon -- Pharyngeal -- Pharyngeal- Honey Cup Delayed swallow initiation-vallecula Pharyngeal -- Pharyngeal- Nectar Teaspoon -- Pharyngeal -- Pharyngeal- Nectar Cup Delayed swallow initiation-pyriform sinuses;Penetration/Aspiration during swallow Pharyngeal Material enters airway, remains ABOVE vocal cords and not ejected out;Material enters airway, CONTACTS cords and not ejected out Pharyngeal- Nectar Straw -- Pharyngeal -- Pharyngeal- Thin Teaspoon -- Pharyngeal -- Pharyngeal- Thin Cup Penetration/Aspiration during swallow;Delayed swallow initiation-pyriform sinuses Pharyngeal Material enters airway, remains ABOVE vocal cords then ejected out;Material enters airway, remains ABOVE vocal cords and not ejected out Pharyngeal- Thin Straw -- Pharyngeal -- Pharyngeal- Puree Delayed swallow initiation-vallecula Pharyngeal -- Pharyngeal- Mechanical Soft -- Pharyngeal -- Pharyngeal- Regular WFL Pharyngeal -- Pharyngeal-  Multi-consistency -- Pharyngeal -- Pharyngeal- Pill -- Pharyngeal -- Pharyngeal Comment --  CHL IP CERVICAL ESOPHAGEAL PHASE 12/02/2019 Cervical Esophageal Phase WFL Pudding Teaspoon -- Pudding Cup -- Honey Teaspoon -- Honey Cup -- Nectar Teaspoon -- Nectar Cup -- Nectar Straw -- Thin Teaspoon -- Thin Cup -- Thin Straw -- Puree -- Mechanical Soft -- Regular -- Multi-consistency --  Pill -- Cervical Esophageal Comment -- Houston Siren 12/02/2019, 10:42 AM Orbie Pyo Colvin Caroli.Ed Actor Pager [phone] Office [phone]              ECHOCARDIOGRAM COMPLETE  Result Date: 12/01/2019    ECHOCARDIOGRAM REPORT   Patient Name:   EDGARD DEBORD Date of Exam: 12/01/2019 Medical Rec #:  [id number]    Height:       65.0 in Accession #:    [id number]   Weight:       115.3 lb Date of Birth:  [birth year]    BSA:          1.565 m Patient Age:    46 years     BP:           109/66 mmHg Patient Gender: M            HR:           86 bpm. Exam Location:  Inpatient Procedure: 2D Echo, Cardiac Doppler and Color Doppler Indications:    Dyspnea  History:        Patient has prior history of Echocardiogram examinations, most                 recent 10/17/2017. CHF, CAD, COPD; Risk Factors:Diabetes,                 Hypertension, Former Smoker and Dyslipidemia. H/O stroke. ESRD.  Sonographer:    Clayton Lefort RDCS (AE) Referring Phys: [place]  1. Left ventricular ejection fraction, by estimation, is 55 to 60%. The left ventricle has normal function. The left ventricle has no regional wall motion abnormalities. Left ventricular diastolic parameters are consistent with Grade I diastolic dysfunction (impaired relaxation).  2. Right ventricular systolic function is normal. The right ventricular size is normal. Tricuspid regurgitation signal is inadequate for assessing PA pressure.  3. The mitral valve is normal in structure. No evidence of mitral valve regurgitation. No evidence of mitral  stenosis.  4. The aortic valve is normal in structure. Aortic valve regurgitation is not visualized. Mild aortic valve sclerosis is present, with no evidence of aortic valve stenosis.  5. Aortic dilatation noted. There is borderline dilatation of the aortic root, measuring 39 mm.  6. The inferior vena cava is normal in size with greater than 50% respiratory variability, suggesting right atrial pressure of 3 mmHg. FINDINGS  Left Ventricle: Left ventricular ejection fraction, by estimation, is 55 to 60%. The left ventricle has normal function. The left ventricle has no regional wall motion abnormalities. The left ventricular internal cavity size was normal in size. There is  no left ventricular hypertrophy. Left ventricular diastolic parameters are consistent with Grade I diastolic dysfunction (impaired relaxation). Normal left ventricular filling pressure. Right Ventricle: The right ventricular size is normal. No increase in right ventricular wall thickness. Right ventricular systolic function is normal. Tricuspid regurgitation signal is inadequate for assessing PA pressure. Left Atrium: Left atrial size was normal in size. Right Atrium: Right atrial size was normal in size. Pericardium: There is no evidence of pericardial effusion. Mitral Valve: The mitral valve is normal in structure. No evidence of mitral valve regurgitation. No evidence of mitral valve stenosis. Tricuspid Valve: The tricuspid valve is normal in structure. Tricuspid valve regurgitation is not demonstrated. No evidence of tricuspid stenosis. Aortic Valve: The aortic valve is normal in structure. Aortic valve regurgitation is not visualized. Mild aortic valve sclerosis is present, with no evidence of aortic valve  stenosis. Aortic valve mean gradient measures 3.0 mmHg. Aortic valve peak gradient measures 6.6 mmHg. Aortic valve area, by VTI measures 2.67 cm. Pulmonic Valve: The pulmonic valve was normal in structure. Pulmonic valve regurgitation is  not visualized. No evidence of pulmonic stenosis. Aorta: The aortic root is normal in size and structure and aortic dilatation noted. There is borderline dilatation of the aortic root, measuring 39 mm. Venous: The inferior vena cava is normal in size with greater than 50% respiratory variability, suggesting right atrial pressure of 3 mmHg. IAS/Shunts: No atrial level shunt detected by color flow Doppler.  LEFT VENTRICLE PLAX 2D LVIDd:         4.10 cm  Diastology LVIDs:         2.90 cm  LV e' medial:    6.53 cm/s LV PW:         1.10 cm  LV E/e' medial:  10.7 LV IVS:        1.10 cm  LV e' lateral:   12.50 cm/s LVOT diam:     2.10 cm  LV E/e' lateral: 5.6 LV SV:         52 LV SV Index:   33 LVOT Area:     3.46 cm  RIGHT VENTRICLE             IVC RV Basal diam:  3.00 cm     IVC diam: 1.40 cm RV S prime:     13.50 cm/s TAPSE (M-mode): 2.0 cm LEFT ATRIUM           Index       RIGHT ATRIUM           Index LA diam:      2.60 cm 1.66 cm/m  RA Area:     14.40 cm LA Vol (A4C): 24.7 ml 15.78 ml/m RA Volume:   35.50 ml  22.68 ml/m  AORTIC VALVE AV Area (Vmax):    3.11 cm AV Area (Vmean):   3.17 cm AV Area (VTI):     2.67 cm AV Vmax:           128.00 cm/s AV Vmean:          84.500 cm/s AV VTI:            0.196 m AV Peak Grad:      6.6 mmHg AV Mean Grad:      3.0 mmHg LVOT Vmax:         115.00 cm/s LVOT Vmean:        77.300 cm/s LVOT VTI:          0.151 m LVOT/AV VTI ratio: 0.77  AORTA Ao Root diam: 3.90 cm MITRAL VALVE MV Area (PHT): 2.71 cm    SHUNTS MV Decel Time: 280 msec    Systemic VTI:  0.15 m MV E velocity: 69.90 cm/s  Systemic Diam: 2.10 cm MV A velocity: 71.30 cm/s MV E/A ratio:  0.98 Mihai Croitoru MD Electronically signed by Sanda Klein MD Signature Date/Time: 12/01/2019/5:29:12 PM    Final     Labs:  CBC: Recent Labs    11/30/19 1138 11/30/19 1209 12/01/19 0137 12/01/19 0512 12/01/19 1537 12/02/19 0300  WBC 23.7*  --   --  21.0*  --  19.8*  HGB 11.4*   < > 12.6* 9.5* 10.1* 9.9*  HCT 36.1*    < > 37.0* 29.7* 31.4* 31.3*  PLT 396  --   --  298  --  304   < > =  values in this interval not displayed.    COAGS: No results for input(s): INR, APTT in the last 8760 hours.  BMP: Recent Labs    03/19/19 1524 11/30/19 1209 11/30/19 1350 12/01/19 0137 12/01/19 0512 12/02/19 0300  NA 137   < > 128* 126* 129* 135  K 4.7   < > 4.5 4.1 4.5 3.5  CL 92*  --  81*  --  84* 94*  CO2 26  --  28  --  25 26  GLUCOSE 141*  --  330*  --  280* 158*  BUN 57*  --  86*  --  105* 52*  CALCIUM 8.8  --  9.5  --  9.2 8.8*  CREATININE 5.96*  --  8.16*  --  8.98* 5.19*  GFRNONAA 10*  --  7*  --  6* 12*  GFRAA 11*  --   --   --   --   --    < > = values in this interval not displayed.    LIVER FUNCTION TESTS: Recent Labs    03/19/19 1524 11/30/19 1350 12/01/19 0512 12/02/19 0300  BILITOT <0.2 1.3* 1.3*  --   AST 13 20 21   --   ALT 12 21 19   --   ALKPHOS 133* 81 75  --   PROT 7.8 7.9 6.9  --   ALBUMIN 4.2 2.9* 2.6* 2.6*    TUMOR MARKERS: No results for input(s): AFPTM, CEA, CA199, CHROMGRNA in the last 8760 hours.  Assessment and Plan:  Resp failure-- PNA ESRD Clotted LUA dialysis graft Scheduled for evaluation and possible intervention in IT 10/28 Risks and benefits discussed with the patient including, but not limited to bleeding, infection, vascular injury, pulmonary embolism, need for tunneled HD catheter placement or even death.  All of the patient's questions were answered, patient is agreeable to proceed. Consent signed and in chart.   Thank you for this interesting consult.  I greatly enjoyed meeting Emersen Carroll and look forward to participating in their care.  A copy of this report was sent to the requesting provider on this date.  Electronically Signed: Lavonia Drafts, PA-C 12/02/2019, 3:26 PM   I spent a total of 20 Minutes    in face to face in clinical consultation, greater than 50% of which was counseling/coordinating care for LUA graft declot

## 2019-12-03 ENCOUNTER — Inpatient Hospital Stay (HOSPITAL_COMMUNITY): Payer: BC Managed Care – PPO

## 2019-12-03 DIAGNOSIS — J449 Chronic obstructive pulmonary disease, unspecified: Secondary | ICD-10-CM

## 2019-12-03 HISTORY — PX: IR THROMBECTOMY AV FISTULA W/THROMBOLYSIS/PTA/STENT INC/SHUNT/IMG LT: IMG6107

## 2019-12-03 HISTORY — PX: IR US GUIDE VASC ACCESS LEFT: IMG2389

## 2019-12-03 LAB — RENAL FUNCTION PANEL
Albumin: 2.7 g/dL — ABNORMAL LOW (ref 3.5–5.0)
Anion gap: 17 — ABNORMAL HIGH (ref 5–15)
BUN: 83 mg/dL — ABNORMAL HIGH (ref 6–20)
CO2: 23 mmol/L (ref 22–32)
Calcium: 9.3 mg/dL (ref 8.9–10.3)
Chloride: 96 mmol/L — ABNORMAL LOW (ref 98–111)
Creatinine, Ser: 7.09 mg/dL — ABNORMAL HIGH (ref 0.61–1.24)
GFR, Estimated: 9 mL/min — ABNORMAL LOW (ref >60)
Glucose, Bld: 162 mg/dL — ABNORMAL HIGH (ref 70–99)
Phosphorus: 6.2 mg/dL — ABNORMAL HIGH (ref 2.5–4.6)
Potassium: 3.9 mmol/L (ref 3.5–5.1)
Sodium: 136 mmol/L (ref 135–145)

## 2019-12-03 LAB — CBC
HCT: 32.8 % — ABNORMAL LOW (ref 39.0–52.0)
Hemoglobin: 10.2 g/dL — ABNORMAL LOW (ref 13.0–17.0)
MCH: 30.6 pg (ref 26.0–34.0)
MCHC: 31.1 g/dL (ref 30.0–36.0)
MCV: 98.5 fL (ref 80.0–100.0)
Platelets: 314 10*3/uL (ref 150–400)
RBC: 3.33 MIL/uL — ABNORMAL LOW (ref 4.22–5.81)
RDW: 13.6 % (ref 11.5–15.5)
WBC: 17.5 10*3/uL — ABNORMAL HIGH (ref 4.0–10.5)
nRBC: 0 % (ref 0.0–0.2)

## 2019-12-03 LAB — GLUCOSE, CAPILLARY
Glucose-Capillary: 139 mg/dL — ABNORMAL HIGH (ref 70–99)
Glucose-Capillary: 159 mg/dL — ABNORMAL HIGH (ref 70–99)
Glucose-Capillary: 131 mg/dL — ABNORMAL HIGH (ref 70–99)
Glucose-Capillary: 145 mg/dL — ABNORMAL HIGH (ref 70–99)

## 2019-12-03 MED ORDER — INSULIN DETEMIR 100 UNIT/ML ~~LOC~~ SOLN
7.0000 [IU] | Freq: Two times a day (BID) | SUBCUTANEOUS | Status: DC
  Administered 2019-12-03 – 2019-12-04 (×4): 7 [IU] via SUBCUTANEOUS
  Filled 2019-12-03 (×6): qty 0.07

## 2019-12-03 MED ORDER — UMECLIDINIUM BROMIDE 62.5 MCG/INH IN AEPB
1.0000 | INHALATION_SPRAY | Freq: Every day | RESPIRATORY_TRACT | Status: DC
  Administered 2019-12-04: 1 via RESPIRATORY_TRACT
  Filled 2019-12-03: qty 7

## 2019-12-03 MED ORDER — HEPARIN SODIUM (PORCINE) 1000 UNIT/ML IJ SOLN
INTRAMUSCULAR | Status: AC
  Filled 2019-12-03: qty 1

## 2019-12-03 MED ORDER — PREDNISONE 20 MG PO TABS
40.0000 mg | ORAL_TABLET | Freq: Every day | ORAL | Status: DC
  Administered 2019-12-03: 40 mg via ORAL
  Filled 2019-12-03 (×2): qty 2

## 2019-12-03 MED ORDER — HEPARIN SODIUM (PORCINE) 1000 UNIT/ML IJ SOLN
INTRAMUSCULAR | Status: AC | PRN
  Administered 2019-12-03: 3000 [IU] via INTRAVENOUS

## 2019-12-03 MED ORDER — SODIUM CHLORIDE 0.9 % IV SOLN: 100.0000 mL | INTRAVENOUS | Status: DC | PRN

## 2019-12-03 MED ORDER — FENTANYL CITRATE (PF) 100 MCG/2ML IJ SOLN
INTRAMUSCULAR | Status: AC | PRN
Start: 2019-12-03 — End: 2019-12-03
  Administered 2019-12-03: 12.5 ug via INTRAVENOUS
  Administered 2019-12-03: 25 ug via INTRAVENOUS
  Administered 2019-12-03: 12.5 ug via INTRAVENOUS

## 2019-12-03 MED ORDER — LIDOCAINE HCL (PF) 1 % IJ SOLN: 5.0000 mL | INTRAMUSCULAR | Status: DC | PRN

## 2019-12-03 MED ORDER — DOXYCYCLINE HYCLATE 100 MG PO TABS
100.0000 mg | ORAL_TABLET | Freq: Two times a day (BID) | ORAL | Status: AC
  Administered 2019-12-03 – 2019-12-04 (×3): 100 mg via ORAL
  Filled 2019-12-03 (×4): qty 1

## 2019-12-03 MED ORDER — LIDOCAINE HCL (PF) 1 % IJ SOLN
INTRAMUSCULAR | Status: AC | PRN
  Administered 2019-12-03: 5 mL

## 2019-12-03 MED ORDER — MIDAZOLAM HCL 2 MG/2ML IJ SOLN
INTRAMUSCULAR | Status: AC | PRN
  Administered 2019-12-03: 0.5 mg via INTRAVENOUS
  Administered 2019-12-03 (×2): .25 mg via INTRAVENOUS

## 2019-12-03 MED ORDER — HEPARIN SODIUM (PORCINE) 1000 UNIT/ML DIALYSIS: 1000.0000 [IU] | INTRAMUSCULAR | Status: DC | PRN

## 2019-12-03 MED ORDER — MIDAZOLAM HCL 2 MG/2ML IJ SOLN
INTRAMUSCULAR | Status: AC
  Filled 2019-12-03: qty 2

## 2019-12-03 MED ORDER — SEVELAMER CARBONATE 800 MG PO TABS
2400.0000 mg | ORAL_TABLET | Freq: Three times a day (TID) | ORAL | Status: DC
  Administered 2019-12-03 – 2019-12-04 (×3): 2400 mg via ORAL
  Filled 2019-12-03 (×3): qty 3

## 2019-12-03 MED ORDER — PENTAFLUOROPROP-TETRAFLUOROETH EX AERO: 1.0000 "application " | INHALATION_SPRAY | CUTANEOUS | Status: DC | PRN

## 2019-12-03 MED ORDER — ALTEPLASE 2 MG IJ SOLR: 2.0000 mg | Freq: Once | INTRAMUSCULAR | Status: DC | PRN

## 2019-12-03 MED ORDER — IOHEXOL 300 MG/ML  SOLN
100.0000 mL | Freq: Once | INTRAMUSCULAR | Status: AC | PRN
  Administered 2019-12-03: 50 mL via INTRAVENOUS

## 2019-12-03 MED ORDER — IOHEXOL 300 MG/ML  SOLN
100.0000 mL | Freq: Once | INTRAMUSCULAR | Status: AC | PRN
  Administered 2019-12-03: 14 mL via INTRAVENOUS

## 2019-12-03 MED ORDER — CEFDINIR 300 MG PO CAPS
300.0000 mg | ORAL_CAPSULE | ORAL | Status: AC
  Administered 2019-12-03: 300 mg via ORAL
  Filled 2019-12-03 (×2): qty 1

## 2019-12-03 MED ORDER — ALTEPLASE 2 MG IJ SOLR
INTRAMUSCULAR | Status: AC
  Filled 2019-12-03: qty 2

## 2019-12-03 MED ORDER — LIDOCAINE-PRILOCAINE 2.5-2.5 % EX CREA: 1.0000 "application " | TOPICAL_CREAM | CUTANEOUS | Status: DC | PRN

## 2019-12-03 MED ORDER — FENTANYL CITRATE (PF) 100 MCG/2ML IJ SOLN
INTRAMUSCULAR | Status: AC
  Filled 2019-12-03: qty 2

## 2019-12-03 MED ORDER — LIDOCAINE HCL 1 % IJ SOLN
INTRAMUSCULAR | Status: AC
  Filled 2019-12-03: qty 20

## 2019-12-03 NOTE — Progress Notes (Addendum)
[facility] Progress Note   Subjective:  Seen on HD - 3.5L UFG. S/p AVF declot this morning with covered stent placed over pseudoaneurysm. Running ok now with BFR 325. Dyspnea slightly improved.  Objective Vitals:   12/03/19 1215 12/03/19 1220 12/03/19 1225 12/03/19 1240  BP: (!) 147/70 (!) 162/79 (!) 151/70 (!) 144/71  Pulse: 71 71 69 74  Resp: 20 (!) 22 (!) 21 (!) 22  Temp:      TempSrc:      SpO2: 100% 97% 97% 99%  Weight:      Height:       Physical Exam General: Ill appearing much much improved, NAD. Nasal O2 in place. Heart: RRR; no murmur Lungs: occ exp wheezes, no rales, occ rhonchi Abdomen: soft, non-tender Extremities: No RLE edema, no L stump edema Dialysis Access: L AVF + thrill   Additional Objective Labs: Basic Metabolic Panel: Recent Labs  Lab 12/01/19 0512 12/02/19 0300 12/03/19 0349  NA 129* 135 136  K 4.5 3.5 3.9  CL 84* 94* 96*  CO2 25 26 23   GLUCOSE 280* 158* 162*  BUN 105* 52* 83*  CREATININE 8.98* 5.19* 7.09*  CALCIUM 9.2 8.8* 9.3  PHOS 6.6* 5.8* 6.2*   Liver Function Tests: Recent Labs  Lab 11/30/19 1350 11/30/19 1350 12/01/19 0512 12/02/19 0300 12/03/19 0349  AST 20  --  21  --   --   ALT 21  --  19  --   --   ALKPHOS 81  --  75  --   --   BILITOT 1.3*  --  1.3*  --   --   PROT 7.9  --  6.9  --   --   ALBUMIN 2.9*   < > 2.6* 2.6* 2.7*   < > = values in this interval not displayed.   CBC: Recent Labs  Lab 11/30/19 1138 11/30/19 1209 12/01/19 0512 12/01/19 0512 12/01/19 1537 12/02/19 0300 12/03/19 0349  WBC 23.7*   < > 21.0*  --   --  19.8* 17.5*  HGB 11.4*   < > 9.5*   < > 10.1* 9.9* 10.2*  HCT 36.1*   < > 29.7*   < > 31.4* 31.3* 32.8*  MCV 98.9  --  100.3*  --   --  99.1 98.5  PLT 396   < > 298  --   --  304 314   < > = values in this interval not displayed.   Blood Culture    Component Value Date/Time   SDES BLOOD RIGHT ANTECUBITAL 11/30/2019 1235   SPECREQUEST  11/30/2019 1235    BOTTLES DRAWN  AEROBIC AND ANAEROBIC Blood Culture results may not be optimal due to an inadequate volume of blood received in culture bottles   CULT  11/30/2019 1235    NO GROWTH 3 DAYS Performed at [facility], [place] [address], [place], [place] [address]    REPTSTATUS PENDING 11/30/2019 1235   Studies/Results: CT ANGIO CHEST PE W OR WO CONTRAST  Result Date: 12/01/2019 CLINICAL DATA:  Chest pain.  Shortness of breath. EXAM: CT ANGIOGRAPHY CHEST WITH CONTRAST TECHNIQUE: Multidetector CT imaging of the chest was performed using the standard protocol during bolus administration of intravenous contrast. Multiplanar CT image reconstructions and MIPs were obtained to evaluate the vascular anatomy. CONTRAST:  66mL OMNIPAQUE IOHEXOL 350 MG/ML SOLN COMPARISON:  None. FINDINGS: Cardiovascular: Contrast injection is sufficient to demonstrate satisfactory opacification of the pulmonary arteries to the segmental level. There  is no pulmonary embolus or evidence of right heart strain. The size of the main pulmonary artery is normal. Normal heart size with coronary artery calcification. Mild atherosclerotic changes are noted of the thoracic aorta without evidence for an aneurysm or dissection. There is no significant pericardial effusion. Mediastinum/Nodes: --there are mildly enlarged mediastinal lymph nodes. For example there is a precarinal lymph node measuring 1.2 cm (axial series 5, image 72). --there are mildly enlarged hilar lymph nodes bilaterally. For example there is a right hilar lymph node measuring 1.1 cm (axial series 5, image 82). -- No axillary lymphadenopathy. -- No supraclavicular lymphadenopathy. -- Normal thyroid gland where visualized. -  Unremarkable esophagus. Lungs/Pleura: There are diffuse bilateral tree-in-bud airspace opacities with areas of bronchial wall thickening and significant mucous plugging, especially in the lower lobes. There is debris within the trachea and right mainstem bronchus. There is  an area of consolidation within the right lower lobe measuring approximately 4.8 cm. There is no pneumothorax. No large pleural effusion. Upper Abdomen: Contrast bolus timing is not optimized for evaluation of the abdominal organs. The visualized portions of the organs of the upper abdomen are normal. Musculoskeletal: No chest wall abnormality. No bony spinal canal stenosis. Review of the MIP images confirms the above findings. IMPRESSION: 1. No acute pulmonary embolism. 2. Diffuse bilateral tree-in-bud airspace opacities with areas of bronchial wall thickening and significant mucous plugging, especially in the lower lobes. There is an area of consolidation within the right lower lobe measuring approximately 4.8 cm. Findings are consistent with infectious bronchiolitis or aspiration. Follow-up to radiologic resolution for the area of consolidation in the right lower lobe is recommended. 3. Mediastinal and hilar adenopathy, likely reactive. 4. Coronary artery disease. Aortic Atherosclerosis (ICD10-I70.0). Electronically Signed   By: Constance Holster M.D.   On: 12/01/2019 15:16   IR US Guide Vasc Access Left  Result Date: 12/03/2019 INDICATION: 54 year old with end-stage renal disease and occluded left upper arm AV graft. EXAM: DIALYSIS GRAFT DECLOT; GRAFT/VENOUS PTA; PLACEMENT OF VASCULAR STENTS ULTRASOUND GUIDANCE FOR VASCULAR ACCESS Physician: Stephan Minister. Anselm Pancoast, MD MEDICATIONS: None. ANESTHESIA/SEDATION: Heparin 3000 units, TPA 2 mg, Versed 1.0 mg, Fentanyl 50 mcg Moderate Sedation Time:  2 hours and 20 minutes The patient was continuously monitored during the procedure by the interventional radiology nurse under my direct supervision. FLUOROSCOPY TIME:  Fluoroscopy Time: 60 minutes, 42 seconds, 24 mGy CONTRAST:  64 mL Omnipaque 627 COMPLICATIONS: None immediate. PROCEDURE: The procedure was explained to the patient. The risks and benefits of the procedure were discussed and the patient's questions were  addressed. Informed consent was obtained from the patient. Left arm was evaluated with ultrasound. The left arm graft was occluded. Ultrasound image was saved for documentation. Left upper arm was prepped and draped in sterile fashion. Maximal barrier sterile technique was utilized including caps, mask, sterile gowns, sterile gloves, sterile drape, hand hygiene and skin antiseptic. Skin was anesthetized along the proximal and distal aspects of the graft. A 21 gauge needle was directed into the distal aspect of the graft pointing towards the central veins using ultrasound guidance. Micropuncture dilator set was placed. 6 French vascular sheath was placed over a superstiff Amplatz wire. A 5 French catheter and Glidewire were advanced into the central veins and central venogram was performed. A pull-back injection confirmed clot within the graft. 2 mg of tPA was infused along the length of the graft. At this time, a prominent pseudoaneurysm along the proximal aspect of the graft or venous end of the  graft was identified. Pseudoaneurysm location corresponded with previous puncture sites. Mechanical thrombectomy was performed using Angiojet device. Using ultrasound guidance, 21 gauge needle was directed into the graft distal to the pseudoaneurysm area. Micropuncture dilator set was placed and upsized to a 6 [place] vascular sheath. A Fogarty balloon was used to pull the arterial plug and flow was restored within the graft. Multiple shuntogram images were obtained. The venous anastomosis and outflow portion of the graft were treated with 6 mm x 40 mm balloon. At this point, there was marked filling of the large pseudoaneurysm along the proximal or venous end of the graft. Decided the pseudoaneurysm needed to be excluded with a covered stent. The sheath pointing towards the arterial anastomosis was removed using a pursestring suture and there was hemostasis. Additional shuntogram images were performed. At this point, the  patient had a significant coughing spell and it took quite a while for the patient to recover and proceed with the procedure. A covered 7 mm x 60 mm Fluency Plus stent was deployed along the venous limb the graft. Attempted to place the stent in a location that would preserve as much graft as possible for future dialysis access. The stent graft was deployed along the venous limb of the graft but unfortunately there was residual filling of the pseudoaneurysm. Graft was angioplastied with the 6 mm balloon but follow-up shuntogram images again demonstrated filling of the pseudoaneurysm. Therefore, a 7 mm x 60 mm Covera stent was deployed in overlapping fashion through the existing stent. Follow-up shuntogram images demonstrated exclusion of the pseudoaneurysm. The new stent was dilated up to 6 mm. Final shuntogram images were obtained. The 8 French vascular sheath was removed using a pursestring suture. FINDINGS: The left upper arm AV graft was completely occluded at the beginning the procedure. Central veins were patent. A large pseudoaneurysm was identified along the proximal/venous portion of the graft at the beginning of the procedure. The pseudoaneurysm was pre-existing. After flow was restored in the graft, there was significant filling of the pseudoaneurysm. Incomplete exclusion of the pseudoaneurysm after deployment of the first stent. Complete exclusion of the pseudoaneurysm after deployment of the second stent. Graft was widely patent at the end of the procedure. IMPRESSION: Successful declot of the left upper arm AV graft. Pseudoaneurysm along the proximal/venous aspect of the graft was excluded using covered stents. The graft is ready to be used but recommend avoiding puncture of the stented graft. Stented portion of the graft was marked on the skin and there is also a palpable difference between the stented area and the unstented graft on examination. Electronically Signed   By: Markus Daft M.D.   On:  12/03/2019 13:28   DG Swallowing Func-Speech Pathology  Result Date: 12/02/2019 Objective Swallowing Evaluation: Type of Study: MBS-Modified Barium Swallow Study  Patient Details Name: Sparrow Sanzo MRN: [id number] Date of Birth: Feb 11, 1965 Today's Date: 12/02/2019 Time: SLP Start Time (ACUTE ONLY): 2458 -SLP Stop Time (ACUTE ONLY): 0927 SLP Time Calculation (min) (ACUTE ONLY): 23 min Past Medical History: Past Medical History: Diagnosis Date . Anemia of renal disease 05/28/2017 . Chest pain 01/10/2016 . Chronic combined systolic and diastolic heart failure ([place])  . Coronary artery disease   a.12/22/15: NSTEMI s/p overlapping DES x2 and balloon angioplasty to distal AV groove Circumflex (too small for a stent).  . Coronary artery disease due to lipid rich plaque 01/10/2016 . Dehydration 08/22/2012 . Depression  . Elevated troponin  . ESRD (end stage renal disease) on dialysis ([place])  .  Essential hypertension  . History of completed stroke 05/28/2017 . Hypercholesteremia  . Hypertension  . Hypertensive heart disease with heart failure ([place])  . Ischemic cardiomyopathy 03/13/2016 . Normocytic anemia 05/28/2017 . NSTEMI (non-ST elevated myocardial infarction) ([place]) 12/22/2015 . Prostate infection  . Status post coronary artery stent placement  . Syncope 03/13/2016 . Tobacco abuse  . Type 2 diabetes mellitus with diabetic nephropathy, without long-term current use of insulin ([place])  . Type II diabetes mellitus ([place])  . Uncontrolled hypertension 05/28/2017 . Unstable angina pectoris ([place]) 01/11/2016 Past Surgical History: Past Surgical History: Procedure Laterality Date . BASCILIC VEIN TRANSPOSITION Left 06/28/2017  Procedure: LEFT UPPER ARM ARTERIOVENOUS GORTEX-GRAFT PLACEMENT;  Surgeon: Angelia Mould, MD;  Location: [facility];  Service: Vascular;  Laterality: Left; . CARDIAC CATHETERIZATION N/A 12/22/2015  Procedure: Left Heart Cath and Coronary Angiography;  Surgeon: Burnell Blanks, MD;  Location: [facility];   Service: Cardiovascular;  Laterality: N/A; . CARDIAC CATHETERIZATION N/A 12/22/2015  Procedure: Coronary Stent Intervention;  Surgeon: Burnell Blanks, MD;  Location: [facility];  Service: Cardiovascular;  Laterality: N/A; . CARDIAC CATHETERIZATION N/A 01/11/2016  Procedure: Left Heart Cath and Coronary Angiography;  Surgeon: Sherren Mocha, MD;  Location: [facility];  Service: Cardiovascular;  Laterality: N/A; . CARDIAC CATHETERIZATION N/A 01/11/2016  Procedure: Intravascular Pressure Wire/FFR Study;  Surgeon: Sherren Mocha, MD;  Location: [facility];  Service: Cardiovascular;  Laterality: N/A; . CARDIAC CATHETERIZATION N/A 01/11/2016  Procedure: Coronary Stent Intervention;  Surgeon: Sherren Mocha, MD;  Location: [facility];  Service: Cardiovascular;  Laterality: N/A; . CORONARY ANGIOPLASTY WITH STENT PLACEMENT  12/22/2015 . INSERTION OF DIALYSIS CATHETER Right 06/28/2017  Procedure: INSERTION OF DIALYSIS CATHETER RIGHT INTERNAL JUGULAR;  Surgeon: Angelia Mould, MD;  Location: [facility] OR;  Service: Vascular;  Laterality: Right; HPI: 54 yr old admitted with worsening dyspnea, afebrile and productive cough from [facility], Per chart O2 sat noted to be 55% on room air. COVID negative. Found to have CAP. PMH: basal ganglia/pons stroke 2019, ESRD, HTN, DM, NSTEMI, CAD. CXR RRL pna. Outpatient MBS 10/08/19 >pt reported hx of dysphagia in 2019. Found to have delayed pharyngeal swallow with penetration (PAS 3) of large boluses. Reg/thin recommended. Pt admitted to fast rate of eating.  No data recorded Assessment / Plan / Recommendation CHL IP CLINICAL IMPRESSIONS 12/02/2019 Clinical Impression Pt has a history of oropharyngeal dysphagia following stroke in 2019 and continued during today's study. Significant pharyngeal congestion noted yesterday during BSE however today respirations subjectively clear without audible congestion. Oral impairments are mild and marked  by difficulty coordinating and timing propulsion. Motor strength was within normal limits. Timing is delayed in regards to reflexive onset of laryngeal closure resulting in barium reaching and remaining in valleculae for 7 seconds and pyriform sinuses up to 10 seconds. His pyriform sinuses filled leading to barium spilling into laryngeal vestibule and vocal cords during swallow. The thicker the consistency, the less barium able to reach lower pharynx. Verbal cues given to propel and attempt to initiate swallow faster without significant difference and chick tuck posture ineffective strategy. Sensation and awareness of vocal cord penetration was inconsistent and decreased. Honey thick texture better coordinated, barium upper pharynx when initiated and no pharyngeal residue throughout this study. Recommended honey thick liquids, small sips, Dys 3 texture, volitional cough, crush meds and full supervision.      SLP Visit Diagnosis Dysphagia, oropharyngeal phase (R13.12) Attention and concentration deficit following --  Frontal lobe and executive function deficit following -- Impact on safety and function Moderate aspiration risk   CHL IP TREATMENT RECOMMENDATION 12/02/2019 Treatment Recommendations Therapy as outlined in treatment plan below   Prognosis 12/02/2019 Prognosis for Safe Diet Advancement (No Data) Barriers to Reach Goals Cognitive deficits Barriers/Prognosis Comment -- CHL IP DIET RECOMMENDATION 12/02/2019 SLP Diet Recommendations Dysphagia 3 (Mech soft) solids;Honey thick liquids Liquid Administration via Cup Medication Administration Crushed with puree Compensations Minimize environmental distractions;Slow rate;Small sips/bites;Lingual sweep for clearance of pocketing;Clear throat intermittently Postural Changes Seated upright at 90 degrees   CHL IP OTHER RECOMMENDATIONS 12/02/2019 Recommended Consults -- Oral Care Recommendations Oral care BID Other Recommendations Order thickener from pharmacy   CHL IP  FOLLOW UP RECOMMENDATIONS 12/02/2019 Follow up Recommendations Skilled Nursing facility   [facility] IP FREQUENCY AND DURATION 12/02/2019 Speech Therapy Frequency (ACUTE ONLY) min 2x/week Treatment Duration 2 weeks      CHL IP ORAL PHASE 12/02/2019 Oral Phase Impaired Oral - Pudding Teaspoon -- Oral - Pudding Cup -- Oral - Honey Teaspoon -- Oral - Honey Cup Reduced posterior propulsion Oral - Nectar Teaspoon -- Oral - Nectar Cup Reduced posterior propulsion Oral - Nectar Straw -- Oral - Thin Teaspoon -- Oral - Thin Cup -- Oral - Thin Straw -- Oral - Puree WFL Oral - Mech Soft -- Oral - Regular WFL Oral - Multi-Consistency -- Oral - Pill -- Oral Phase - Comment --  CHL IP PHARYNGEAL PHASE 12/02/2019 Pharyngeal Phase Impaired Pharyngeal- Pudding Teaspoon -- Pharyngeal -- Pharyngeal- Pudding Cup -- Pharyngeal -- Pharyngeal- Honey Teaspoon -- Pharyngeal -- Pharyngeal- Honey Cup Delayed swallow initiation-vallecula Pharyngeal -- Pharyngeal- Nectar Teaspoon -- Pharyngeal -- Pharyngeal- Nectar Cup Delayed swallow initiation-pyriform sinuses;Penetration/Aspiration during swallow Pharyngeal Material enters airway, remains ABOVE vocal cords and not ejected out;Material enters airway, CONTACTS cords and not ejected out Pharyngeal- Nectar Straw -- Pharyngeal -- Pharyngeal- Thin Teaspoon -- Pharyngeal -- Pharyngeal- Thin Cup Penetration/Aspiration during swallow;Delayed swallow initiation-pyriform sinuses Pharyngeal Material enters airway, remains ABOVE vocal cords then ejected out;Material enters airway, remains ABOVE vocal cords and not ejected out Pharyngeal- Thin Straw -- Pharyngeal -- Pharyngeal- Puree Delayed swallow initiation-vallecula Pharyngeal -- Pharyngeal- Mechanical Soft -- Pharyngeal -- Pharyngeal- Regular WFL Pharyngeal -- Pharyngeal- Multi-consistency -- Pharyngeal -- Pharyngeal- Pill -- Pharyngeal -- Pharyngeal Comment --  CHL IP CERVICAL ESOPHAGEAL PHASE 12/02/2019 Cervical Esophageal Phase WFL Pudding Teaspoon --  Pudding Cup -- Honey Teaspoon -- Honey Cup -- Nectar Teaspoon -- Nectar Cup -- Nectar Straw -- Thin Teaspoon -- Thin Cup -- Thin Straw -- Puree -- Mechanical Soft -- Regular -- Multi-consistency -- Pill -- Cervical Esophageal Comment -- [place] Siren 12/02/2019, 10:42 AM Orbie Pyo Colvin Caroli.Ed Actor Pager [phone] Office [phone]              IR THROMBECTOMY AV FISTULA W/THROMBOLYSIS/PTA/STENT INC SHUNT IMG LT  Result Date: 12/03/2019 INDICATION: 54 year old with end-stage renal disease and occluded left upper arm AV graft. EXAM: DIALYSIS GRAFT DECLOT; GRAFT/VENOUS PTA; PLACEMENT OF VASCULAR STENTS ULTRASOUND GUIDANCE FOR VASCULAR ACCESS Physician: Stephan Minister. Anselm Pancoast, MD MEDICATIONS: None. ANESTHESIA/SEDATION: Heparin 3000 units, TPA 2 mg, Versed 1.0 mg, Fentanyl 50 mcg Moderate Sedation Time:  2 hours and 20 minutes The patient was continuously monitored during the procedure by the interventional radiology nurse under my direct supervision. FLUOROSCOPY TIME:  Fluoroscopy Time: 60 minutes, 42 seconds, 24 mGy CONTRAST:  64 mL Omnipaque 580 COMPLICATIONS: None immediate. PROCEDURE: The procedure was explained to the patient. The risks and benefits  of the procedure were discussed and the patient's questions were addressed. Informed consent was obtained from the patient. Left arm was evaluated with ultrasound. The left arm graft was occluded. Ultrasound image was saved for documentation. Left upper arm was prepped and draped in sterile fashion. Maximal barrier sterile technique was utilized including caps, mask, sterile gowns, sterile gloves, sterile drape, hand hygiene and skin antiseptic. Skin was anesthetized along the proximal and distal aspects of the graft. A 21 gauge needle was directed into the distal aspect of the graft pointing towards the central veins using ultrasound guidance. Micropuncture dilator set was placed. 6 French vascular sheath was placed over a superstiff  Amplatz wire. A 5 French catheter and Glidewire were advanced into the central veins and central venogram was performed. A pull-back injection confirmed clot within the graft. 2 mg of tPA was infused along the length of the graft. At this time, a prominent pseudoaneurysm along the proximal aspect of the graft or venous end of the graft was identified. Pseudoaneurysm location corresponded with previous puncture sites. Mechanical thrombectomy was performed using Angiojet device. Using ultrasound guidance, 21 gauge needle was directed into the graft distal to the pseudoaneurysm area. Micropuncture dilator set was placed and upsized to a 6 [place] vascular sheath. A Fogarty balloon was used to pull the arterial plug and flow was restored within the graft. Multiple shuntogram images were obtained. The venous anastomosis and outflow portion of the graft were treated with 6 mm x 40 mm balloon. At this point, there was marked filling of the large pseudoaneurysm along the proximal or venous end of the graft. Decided the pseudoaneurysm needed to be excluded with a covered stent. The sheath pointing towards the arterial anastomosis was removed using a pursestring suture and there was hemostasis. Additional shuntogram images were performed. At this point, the patient had a significant coughing spell and it took quite a while for the patient to recover and proceed with the procedure. A covered 7 mm x 60 mm Fluency Plus stent was deployed along the venous limb the graft. Attempted to place the stent in a location that would preserve as much graft as possible for future dialysis access. The stent graft was deployed along the venous limb of the graft but unfortunately there was residual filling of the pseudoaneurysm. Graft was angioplastied with the 6 mm balloon but follow-up shuntogram images again demonstrated filling of the pseudoaneurysm. Therefore, a 7 mm x 60 mm Covera stent was deployed in overlapping fashion through the  existing stent. Follow-up shuntogram images demonstrated exclusion of the pseudoaneurysm. The new stent was dilated up to 6 mm. Final shuntogram images were obtained. The 8 French vascular sheath was removed using a pursestring suture. FINDINGS: The left upper arm AV graft was completely occluded at the beginning the procedure. Central veins were patent. A large pseudoaneurysm was identified along the proximal/venous portion of the graft at the beginning of the procedure. The pseudoaneurysm was pre-existing. After flow was restored in the graft, there was significant filling of the pseudoaneurysm. Incomplete exclusion of the pseudoaneurysm after deployment of the first stent. Complete exclusion of the pseudoaneurysm after deployment of the second stent. Graft was widely patent at the end of the procedure. IMPRESSION: Successful declot of the left upper arm AV graft. Pseudoaneurysm along the proximal/venous aspect of the graft was excluded using covered stents. The graft is ready to be used but recommend avoiding puncture of the stented graft. Stented portion of the graft was marked on the skin  and there is also a palpable difference between the stented area and the unstented graft on examination. Electronically Signed   By: Markus Daft M.D.   On: 12/03/2019 13:28   ECHOCARDIOGRAM COMPLETE  Result Date: 12/01/2019    ECHOCARDIOGRAM REPORT   Patient Name:   Jeffery Young Date of Exam: 12/01/2019 Medical Rec #:  [id number]    Height:       65.0 in Accession #:    [id number]   Weight:       115.3 lb Date of Birth:  [birth year]    BSA:          1.565 m Patient Age:    54 years     BP:           109/66 mmHg Patient Gender: M            HR:           86 bpm. Exam Location:  Inpatient Procedure: 2D Echo, Cardiac Doppler and Color Doppler Indications:    Dyspnea  History:        Patient has prior history of Echocardiogram examinations, most                 recent 10/17/2017. CHF, CAD, COPD; Risk Factors:Diabetes,                  Hypertension, Former Smoker and Dyslipidemia. H/O stroke. ESRD.  Sonographer:    Clayton Lefort RDCS (AE) Referring Phys: [place]  1. Left ventricular ejection fraction, by estimation, is 55 to 60%. The left ventricle has normal function. The left ventricle has no regional wall motion abnormalities. Left ventricular diastolic parameters are consistent with Grade I diastolic dysfunction (impaired relaxation).  2. Right ventricular systolic function is normal. The right ventricular size is normal. Tricuspid regurgitation signal is inadequate for assessing PA pressure.  3. The mitral valve is normal in structure. No evidence of mitral valve regurgitation. No evidence of mitral stenosis.  4. The aortic valve is normal in structure. Aortic valve regurgitation is not visualized. Mild aortic valve sclerosis is present, with no evidence of aortic valve stenosis.  5. Aortic dilatation noted. There is borderline dilatation of the aortic root, measuring 39 mm.  6. The inferior vena cava is normal in size with greater than 50% respiratory variability, suggesting right atrial pressure of 3 mmHg. FINDINGS  Left Ventricle: Left ventricular ejection fraction, by estimation, is 55 to 60%. The left ventricle has normal function. The left ventricle has no regional wall motion abnormalities. The left ventricular internal cavity size was normal in size. There is  no left ventricular hypertrophy. Left ventricular diastolic parameters are consistent with Grade I diastolic dysfunction (impaired relaxation). Normal left ventricular filling pressure. Right Ventricle: The right ventricular size is normal. No increase in right ventricular wall thickness. Right ventricular systolic function is normal. Tricuspid regurgitation signal is inadequate for assessing PA pressure. Left Atrium: Left atrial size was normal in size. Right Atrium: Right atrial size was normal in size. Pericardium: There is no evidence of  pericardial effusion. Mitral Valve: The mitral valve is normal in structure. No evidence of mitral valve regurgitation. No evidence of mitral valve stenosis. Tricuspid Valve: The tricuspid valve is normal in structure. Tricuspid valve regurgitation is not demonstrated. No evidence of tricuspid stenosis. Aortic Valve: The aortic valve is normal in structure. Aortic valve regurgitation is not visualized. Mild aortic valve sclerosis is present, with no evidence of aortic valve stenosis. Aortic  valve mean gradient measures 3.0 mmHg. Aortic valve peak gradient measures 6.6 mmHg. Aortic valve area, by VTI measures 2.67 cm. Pulmonic Valve: The pulmonic valve was normal in structure. Pulmonic valve regurgitation is not visualized. No evidence of pulmonic stenosis. Aorta: The aortic root is normal in size and structure and aortic dilatation noted. There is borderline dilatation of the aortic root, measuring 39 mm. Venous: The inferior vena cava is normal in size with greater than 50% respiratory variability, suggesting right atrial pressure of 3 mmHg. IAS/Shunts: No atrial level shunt detected by color flow Doppler.  LEFT VENTRICLE PLAX 2D LVIDd:         4.10 cm  Diastology LVIDs:         2.90 cm  LV e' medial:    6.53 cm/s LV PW:         1.10 cm  LV E/e' medial:  10.7 LV IVS:        1.10 cm  LV e' lateral:   12.50 cm/s LVOT diam:     2.10 cm  LV E/e' lateral: 5.6 LV SV:         52 LV SV Index:   33 LVOT Area:     3.46 cm  RIGHT VENTRICLE             IVC RV Basal diam:  3.00 cm     IVC diam: 1.40 cm RV S prime:     13.50 cm/s TAPSE (M-mode): 2.0 cm LEFT ATRIUM           Index       RIGHT ATRIUM           Index LA diam:      2.60 cm 1.66 cm/m  RA Area:     14.40 cm LA Vol (A4C): 24.7 ml 15.78 ml/m RA Volume:   35.50 ml  22.68 ml/m  AORTIC VALVE AV Area (Vmax):    3.11 cm AV Area (Vmean):   3.17 cm AV Area (VTI):     2.67 cm AV Vmax:           128.00 cm/s AV Vmean:          84.500 cm/s AV VTI:            0.196 m AV  Peak Grad:      6.6 mmHg AV Mean Grad:      3.0 mmHg LVOT Vmax:         115.00 cm/s LVOT Vmean:        77.300 cm/s LVOT VTI:          0.151 m LVOT/AV VTI ratio: 0.77  AORTA Ao Root diam: 3.90 cm MITRAL VALVE MV Area (PHT): 2.71 cm    SHUNTS MV Decel Time: 280 msec    Systemic VTI:  0.15 m MV E velocity: 69.90 cm/s  Systemic Diam: 2.10 cm MV A velocity: 71.30 cm/s MV E/A ratio:  0.98 Mihai Croitoru MD Electronically signed by Sanda Klein MD Signature Date/Time: 12/01/2019/5:29:12 PM    Final    Medications:  . aspirin  81 mg Oral Daily  . atorvastatin  10 mg Oral Daily  . carvedilol  6.25 mg Oral BID WC  . cefdinir  300 mg Oral Q48H  . Chlorhexidine Gluconate Cloth  6 each Topical Q0600  . doxycycline  100 mg Oral Q12H  . [START ON 12/04/2019] heparin injection (subcutaneous)  5,000 Units Subcutaneous Q8H  . insulin aspart  0-15 Units Subcutaneous TID WC  .  insulin aspart  0-5 Units Subcutaneous QHS  . insulin detemir  7 Units Subcutaneous BID  . mometasone-formoterol  2 puff Inhalation BID  . predniSONE  40 mg Oral Q breakfast  . sertraline  100 mg Oral Daily  . sodium chloride flush  3 mL Intravenous Q12H  . umeclidinium bromide  1 puff Inhalation Daily    Dialysis Orders: MWF at [facility] - full HD 10/22, left 1kg up 3:45hr, 400/600, EDW 56kg, 2K/2Ca, AVG, no heparin - Mircera 94mcg IV q 4 weeks - Calcitriol 1.62mcg PO q HD - Venofer 50mg  IV weekly  Assessment/Plan: 1. RLL pneumonia:BCx drawn, started on Vanc/Cefepime/Doxy + steroids + duo-nebs. Hx COPD. 2. Acute/ chronic hypoxic Resp Failure: Symptoms and O2 requirement improving. 3. ^ Troponin:Suspect demand ischemia - per primary team. 4. ESRD:Usual MWF sched. HD today d/t none yesterday with clotted AVG, back to usual HD tomorrow. Appreciate IR for declotting. 5. Hypertension/volume:BP high - no edema on exam. Below EDW, challenging down as tolerated. 6. Anemiaof ESRD:Hgb 10.2 - follow. 7. Metabolic  bone disease: CorrCa high, Phos high. Resuming binders, holding VDRA for now. 8. T2DM 9. CAD/Hx stents 10. Hx CVA 11. COPD/Former heavy tobacco use  Veneta Penton, PA-C 12/03/2019, 2:47 PM  [facility]  I have seen and examined this patient and agree with the plan of care. Seen on HD  3k 2L (1.5L net) UF goal 123/69 Appreciate VIR (Dr. Anselm Pancoast) successfully declotting the access; we were able to avoid cannulating the stent on HD today. Will evaluate again tomorrow without needles in place to determine if a surgical revision will be necessary.  Dwana Melena, MD 12/03/2019, 3:18 PM

## 2019-12-03 NOTE — Sedation Documentation (Signed)
No sticks above superior bumper per Dr. Anselm Pancoast.

## 2019-12-03 NOTE — Progress Notes (Addendum)
Family Medicine Teaching Service Daily Progress Note Intern Pager: [phone]  Patient name: Jeffery Young Medical record number: [id number] Date of birth: 07-17-1965 Age: 54 y.o. Gender: male  Primary Care Provider: [name], MD Consultants: Nephrology, CCM   Code Status: Full Code  Pt Overview and Major Events to Date:  10/25 Admitted 10/28 LUE AVG declot by IR  Assessment and Plan: [name] is a 54 y.o. male presenting with hypoxia secondary to PNA . PMH is significant for ESRD, HTN, HLD, Type 2 DM and history of CVA.   Acute hypoxic respiratory failure  Community acquired pneumonia Hypoxic to 55% on room air prior to arrival, initially up to 20L HFNC. Leukocytosis and CXR with evidence of RLL pneumonia, possible aspiration.  Improving, breathing comfortably on room air this AM. Can likely discharge if doing well off O2. Considering de-escalating antibiotics to levofloxacin vs. Doxycycline + cefdinir (with HD) - transition to prednisone 40 mg, to finish tomorrow for 5-day course (10/25-) - doxycycline (10/26-) - cefepime (10/25-), plan to descalate - s/p vancomycin (10/25-27) - s/p azithromycin 10/25) - continue home COPD meds: Dulera (formulary) - DuoNeb prn, consider restarting LAMA  ESRD On MWF HD.  - HD per nephrology, appreciate involvement - IR procedure today for LUE AVG declotting  Dysphagia Improved, DYS 3 diet per SLP after MBS. - SLP  QTc prolongation Resolved. QTc 510 on admission, now 454.  Elevated troponin Likely demand ischemia in the setting of ESRD. No obvious ST changes on EKG, though poor study. Troponins have flattened. No chest pain. - monitor for symptoms  HTN Home meds: carvedilol 6.25 mg BID. Hypertensive 169/83 this AM. Unable to perform HD yesterday due to LUE graft clot, will monitor for now and consider adjusting medications if still hypertensive after he is able to receive HD. - home carvedilol  Normocytic anemia Likely  secondary to ESRD. Hgb stable.  T2DM Last HbA1c 7.3 in 03/2019. No home antihyperglycemics. Elevated CBG in the PM yesterday up to 387, received 18 units sliding scale yesterday, Levemir likely wearing off. - moderate SSI + qhs - consider increasing Levemir to 10u BID - monitor CBG  Other problems chronic and stable: HLD History of tobacco use History of CVA Alcohol use Depression  FEN/GI: NPO PPx: [place]  Disposition: med-surg  Subjective:  Reports feeling good this morning.  Denies shortness of breath. Still coughing but improved.  Objective: Temp:  [97.9 F (36.6 C)-98.5 F (36.9 C)] 97.9 F (36.6 C) (10/28 0719) Pulse Rate:  [61-78] 78 (10/28 0719) Resp:  [17-19] 18 (10/28 0719) BP: (132-170)/(66-88) 169/83 (10/28 0719) SpO2:  [93 %-99 %] 94 % (10/28 0724) Physical Exam: General: NAD Cardiovascular: RRR, no murmurs Respiratory: faint rales heard in the RLL, no rhonchi, no signs of respiratory distress, breathing comfortably on room air Abdomen: soft, non-tender, +BS Extremities: WWP, no edema  Laboratory: Recent Labs  Lab 12/01/19 0512 12/01/19 0512 12/01/19 1537 12/02/19 0300 12/03/19 0349  WBC 21.0*  --   --  19.8* 17.5*  HGB 9.5*   < > 10.1* 9.9* 10.2*  HCT 29.7*   < > 31.4* 31.3* 32.8*  PLT 298  --   --  304 314   < > = values in this interval not displayed.   Recent Labs  Lab 11/30/19 1350 12/01/19 0137 12/01/19 0512 12/02/19 0300 12/03/19 0349  NA 128*   < > 129* 135 136  K 4.5   < > 4.5 3.5 3.9  CL 81*  --  84* 94* 96*  CO2 28  --  25 26 23   BUN 86*  --  105* 52* 83*  CREATININE 8.16*  --  8.98* 5.19* 7.09*  CALCIUM 9.5  --  9.2 8.8* 9.3  PROT 7.9  --  6.9  --   --   BILITOT 1.3*  --  1.3*  --   --   ALKPHOS 81  --  75  --   --   ALT 21  --  19  --   --   AST 20  --  21  --   --   GLUCOSE 330*  --  280* 158* 162*   < > = values in this interval not displayed.    Imaging/Diagnostic Tests: DG Swallowing Func-Speech Pathology  Result  Date: 12/02/2019 Objective Swallowing Evaluation: Type of Study: MBS-Modified Barium Swallow Study  Patient Details Name: Jeffery Young MRN: [id number] Date of Birth: January 11, 1966 Today's Date: 12/02/2019 Time: SLP Start Time (ACUTE ONLY): 7062 -SLP Stop Time (ACUTE ONLY): 0927 SLP Time Calculation (min) (ACUTE ONLY): 23 min Past Medical History: Past Medical History: Diagnosis Date . Anemia of renal disease 05/28/2017 . Chest pain 01/10/2016 . Chronic combined systolic and diastolic heart failure ([place])  . Coronary artery disease   a.12/22/15: NSTEMI s/p overlapping DES x2 and balloon angioplasty to distal AV groove Circumflex (too small for a stent).  . Coronary artery disease due to lipid rich plaque 01/10/2016 . Dehydration 08/22/2012 . Depression  . Elevated troponin  . ESRD (end stage renal disease) on dialysis ([place])  . Essential hypertension  . History of completed stroke 05/28/2017 . Hypercholesteremia  . Hypertension  . Hypertensive heart disease with heart failure ([place])  . Ischemic cardiomyopathy 03/13/2016 . Normocytic anemia 05/28/2017 . NSTEMI (non-ST elevated myocardial infarction) ([place]) 12/22/2015 . Prostate infection  . Status post coronary artery stent placement  . Syncope 03/13/2016 . Tobacco abuse  . Type 2 diabetes mellitus with diabetic nephropathy, without long-term current use of insulin ([place])  . Type II diabetes mellitus ([place])  . Uncontrolled hypertension 05/28/2017 . Unstable angina pectoris ([place]) 01/11/2016 Past Surgical History: Past Surgical History: Procedure Laterality Date . BASCILIC VEIN TRANSPOSITION Left 06/28/2017  Procedure: LEFT UPPER ARM ARTERIOVENOUS GORTEX-GRAFT PLACEMENT;  Surgeon: Angelia Mould, MD;  Location: [place];  Service: Vascular;  Laterality: Left; . CARDIAC CATHETERIZATION N/A 12/22/2015  Procedure: Left Heart Cath and Coronary Angiography;  Surgeon: Burnell Blanks, MD;  Location: [place] [facility];  Service: Cardiovascular;  Laterality: N/A; . CARDIAC  CATHETERIZATION N/A 12/22/2015  Procedure: Coronary Stent Intervention;  Surgeon: Burnell Blanks, MD;  Location: [place] [facility];  Service: Cardiovascular;  Laterality: N/A; . CARDIAC CATHETERIZATION N/A 01/11/2016  Procedure: Left Heart Cath and Coronary Angiography;  Surgeon: Sherren Mocha, MD;  Location: [place] [facility];  Service: Cardiovascular;  Laterality: N/A; . CARDIAC CATHETERIZATION N/A 01/11/2016  Procedure: Intravascular Pressure Wire/FFR Study;  Surgeon: Sherren Mocha, MD;  Location: [place] [facility];  Service: Cardiovascular;  Laterality: N/A; . CARDIAC CATHETERIZATION N/A 01/11/2016  Procedure: Coronary Stent Intervention;  Surgeon: Sherren Mocha, MD;  Location: [place] [facility];  Service: Cardiovascular;  Laterality: N/A; . CORONARY ANGIOPLASTY WITH STENT PLACEMENT  12/22/2015 . INSERTION OF DIALYSIS CATHETER Right 06/28/2017  Procedure: INSERTION OF DIALYSIS CATHETER RIGHT INTERNAL JUGULAR;  Surgeon: Angelia Mould, MD;  Location: [facility] OR;  Service: Vascular;  Laterality: Right; HPI: 53 yr old admitted with worsening dyspnea, afebrile and productive cough from [facility], Per chart O2 sat noted to be 55% on room air. COVID negative. Found to have CAP. PMH: basal ganglia/pons stroke 2019, ESRD, HTN, DM, NSTEMI, CAD. CXR RRL pna. Outpatient MBS 10/08/19 >pt reported hx of dysphagia in 2019. Found to have delayed pharyngeal swallow with penetration (PAS 3) of large boluses. Reg/thin recommended. Pt admitted to fast rate of eating.  No data recorded Assessment / Plan / Recommendation CHL IP CLINICAL IMPRESSIONS 12/02/2019 Clinical Impression Pt has a history of oropharyngeal dysphagia following stroke in 2019 and continued during today's study. Significant pharyngeal congestion noted yesterday during BSE however today respirations subjectively clear without audible congestion. Oral impairments are mild and marked by difficulty coordinating and timing propulsion.  Motor strength was within normal limits. Timing is delayed in regards to reflexive onset of laryngeal closure resulting in barium reaching and remaining in valleculae for 7 seconds and pyriform sinuses up to 10 seconds. His pyriform sinuses filled leading to barium spilling into laryngeal vestibule and vocal cords during swallow. The thicker the consistency, the less barium able to reach lower pharynx. Verbal cues given to propel and attempt to initiate swallow faster without significant difference and chick tuck posture ineffective strategy. Sensation and awareness of vocal cord penetration was inconsistent and decreased. Honey thick texture better coordinated, barium upper pharynx when initiated and no pharyngeal residue throughout this study. Recommended honey thick liquids, small sips, Dys 3 texture, volitional cough, crush meds and full supervision.      SLP Visit Diagnosis Dysphagia, oropharyngeal phase (R13.12) Attention and concentration deficit following -- Frontal lobe and executive function deficit following -- Impact on safety and function Moderate aspiration risk   CHL IP TREATMENT RECOMMENDATION 12/02/2019 Treatment Recommendations Therapy as outlined in treatment plan below   Prognosis 12/02/2019 Prognosis for Safe Diet Advancement (No Data) Barriers to Reach Goals Cognitive deficits Barriers/Prognosis Comment -- CHL IP DIET RECOMMENDATION 12/02/2019 SLP Diet Recommendations Dysphagia 3 (Mech soft) solids;Honey thick liquids Liquid Administration via Cup Medication Administration Crushed with puree Compensations Minimize environmental distractions;Slow rate;Small sips/bites;Lingual sweep for clearance of pocketing;Clear throat intermittently Postural Changes Seated upright at 90 degrees   CHL IP OTHER RECOMMENDATIONS 12/02/2019 Recommended Consults -- Oral Care Recommendations Oral care BID Other Recommendations Order thickener from pharmacy   CHL IP FOLLOW UP RECOMMENDATIONS 12/02/2019 Follow up  Recommendations Skilled Nursing facility   [facility] IP FREQUENCY AND DURATION 12/02/2019 Speech Therapy Frequency (ACUTE ONLY) min 2x/week Treatment Duration 2 weeks      CHL IP ORAL PHASE 12/02/2019 Oral Phase Impaired Oral - Pudding Teaspoon -- Oral - Pudding Cup -- Oral - Honey Teaspoon -- Oral - Honey Cup Reduced posterior propulsion Oral - Nectar Teaspoon -- Oral - Nectar Cup Reduced posterior propulsion Oral - Nectar Straw -- Oral - Thin Teaspoon -- Oral - Thin Cup -- Oral - Thin Straw -- Oral - Puree WFL Oral - Mech Soft -- Oral - Regular WFL Oral - Multi-Consistency -- Oral - Pill -- Oral Phase - Comment --  CHL IP PHARYNGEAL PHASE 12/02/2019 Pharyngeal Phase Impaired Pharyngeal- Pudding Teaspoon -- Pharyngeal -- Pharyngeal- Pudding Cup -- Pharyngeal -- Pharyngeal- Honey Teaspoon -- Pharyngeal -- Pharyngeal- Honey Cup Delayed swallow initiation-vallecula Pharyngeal -- Pharyngeal- Nectar Teaspoon -- Pharyngeal -- Pharyngeal- Nectar Cup Delayed swallow initiation-pyriform sinuses;Penetration/Aspiration during swallow Pharyngeal Material enters airway, remains ABOVE vocal cords and not ejected out;Material enters airway, CONTACTS cords and not ejected out Pharyngeal- Nectar Straw -- Pharyngeal -- Pharyngeal- Thin Teaspoon -- Pharyngeal -- Pharyngeal- Thin Cup Penetration/Aspiration  during swallow;Delayed swallow initiation-pyriform sinuses Pharyngeal Material enters airway, remains ABOVE vocal cords then ejected out;Material enters airway, remains ABOVE vocal cords and not ejected out Pharyngeal- Thin Straw -- Pharyngeal -- Pharyngeal- Puree Delayed swallow initiation-vallecula Pharyngeal -- Pharyngeal- Mechanical Soft -- Pharyngeal -- Pharyngeal- Regular WFL Pharyngeal -- Pharyngeal- Multi-consistency -- Pharyngeal -- Pharyngeal- Pill -- Pharyngeal -- Pharyngeal Comment --  CHL IP CERVICAL ESOPHAGEAL PHASE 12/02/2019 Cervical Esophageal Phase WFL Pudding Teaspoon -- Pudding Cup -- Honey Teaspoon -- Honey Cup --  Nectar Teaspoon -- Nectar Cup -- Nectar Straw -- Thin Teaspoon -- Thin Cup -- Thin Straw -- Puree -- Mechanical Soft -- Regular -- Multi-consistency -- Pill -- Cervical Esophageal Comment -- [place] Siren 12/02/2019, 10:42 AM Orbie Pyo Colvin Caroli.Ed Actor Pager [phone] Office [phone]               Zola Button, MD 12/03/2019, 8:36 AM PGY-1, [place] Intern pager: [phone], text pages welcome

## 2019-12-03 NOTE — Progress Notes (Signed)
OT Cancellation Note  Patient Details Name: Fermin Yan MRN: [id number] DOB: [birth year]   Cancelled Treatment:    Reason Eval/Treat Not Completed: Patient at procedure or test/ unavailable;Medical issues which prohibited therapy;Other (comment) Per nursing, pt getting HD fistula repositioned and heading to HD. Will f/u as time allows for OT session.  Lanier Clam., COTA/L Acute Rehabilitation Services [phone] [place] 12/03/2019, 11:15 AM

## 2019-12-03 NOTE — Procedures (Signed)
Interventional Radiology Procedure:   Indications: Occluded left arm AV graft  Procedure: Left arm graft declot with thrombolysis, balloon angioplasty and covered stent placement.  Findings: Occluded left upper arm AV graft.  Pseudoaneurysm along proximal portion/venous side of graft.  Pseudoaneurysm was pre-existing based on [place].   Restored flow in left arm graft and filling of the pseudoaneurysm.  Treated the pseudoaneurysm with covered stents.  Two 7 mm x 60 mm stents deployed in overlapping fashion.  Graft was widely patent after declot and stent placements with successful exclusion of the pseudoaneuyrsm.  Complications: None     EBL: less than 50 ml  Plan: Remove sutures tomorrow at dialysis.  Will mark skin where stents are located (but can feel transition as well).  Recommend avoiding the stented portion of the graft.  If the patient has future problems, may need surgical revision.     Maclean Foister R. Anselm Pancoast, MD  Pager: [phone]

## 2019-12-03 NOTE — Hospital Course (Addendum)
Jeffery Young is a 54 y.o. male with a history of ESRD, HTN, T2DM, HLD, and CVA who presented with acute hypoxic respiratory failure secondary to pneumonia. Hospital course outlined by problem below:  Acute hypoxic respiratory failure  Pneumonia  Patient was initially brought into the ED by EMS for dyspnea, found to be hypoxic to 55% on room air upon EMS arrival, improved to 95% on 10 L NRB.  Patient was recently diagnosed with pneumonia and had been prescribed amoxicillin-clavulanate on 10/19.  On evaluation in the ED, patient was mildly tachycardic and tachypneic with increased work of breathing.  Patient was requiring up to 20 L HFNC initially.  Labs significant for leukocytosis CXR consistent with right lower lobe pneumonia.  CTPA was negative for PE and with findings consistent with right lower lobe pneumonia, possible aspiration component. He gradually improved with antibiotics and steroids (to cover potential COPD exacerbation), and he was able to be weaned to room air prior to discharge. Treated with 5-day course of high-dose steroids and 5-day antibiotic course.  Antibiotics: Vancomycin (10/25-10/27) Cefepime (10/25-10/28) Azithromycin (10/25) Doxycycline (10/26-10/29), final dose 10/29 PM  Steroids: IV methylprednisolone (10/25-10/27) Prednisone 40 mg (10/28-10/29), needs one more dose  ESRD On MWF HD. Missed dialysis on 10/25, so had HD on 10/26 before resuming normal HD schedule. His LUE AVG was clotted on 10/27, so he was not able to complete HD. Patient underwent uncomplicated declotting procedure on 10/28 by IR, then underwent successful HD that day. Resumed normal HD schedule again 10/29.  Dysphagia Evaluated by SLP and underwent MBS, recommended dysphagia 3 diet.  T2DM Not on any home antihyperglycemic medications.  Blood sugar was controlled with sliding scale insulin and basal insulin.  He was adequately controlled with Levemir 7 units twice a day and moderate sliding scale  insulin with nightly correction.  Decreased to Levemir 5 units twice a day on discharge now that patient is completing steroid course.  Other problems chronic and stable.

## 2019-12-03 NOTE — Sedation Documentation (Signed)
QuikClot applied to left upper arm fistula

## 2019-12-03 NOTE — Progress Notes (Signed)
  Speech Language Pathology Treatment: Dysphagia  Patient Details Name: Jeffery Young MRN: [id number] DOB: Oct 11, 1965 Today's Date: 12/03/2019 Time: 4920-1007 SLP Time Calculation (min) (ACUTE ONLY): 20 min  Assessment / Plan / Recommendation Clinical Impression  Pt coughed after consuming pill (whole, with RN) in honey thickened water with straw. Pt's sensation/reaction to vocal cord penetration during yesterday's MBS was greater than actual severity of penetration. He exhibits a strong/explosive cough with reddened face. When straw was removed, and he consumed with sips honey thick coffee, there were no s/s aspiration. Despite cues to avoid mixed textures, he took sip coffee while masticating cracker on 2 occasions without indications of decreased airway protection. Pt educated re: importance of strategies with meals. His awareness of deficits and potential outcomes is decreased. Continue honey thick liquids, Dys 3.     HPI HPI: 54 yr old admitted with worsening dyspnea, afebrile and productive cough from [facility], Per chart O2 sat noted to be 55% on room air. COVID negative. Found to have CAP. PMH: basal ganglia/pons stroke 2019, ESRD, HTN, DM, NSTEMI, CAD. CXR RRL pna. Outpatient MBS 10/08/19 >pt reported hx of dysphagia in 2019. Found to have delayed pharyngeal swallow with penetration (PAS 3) of large boluses. Reg/thin recommended. Pt admitted to fast rate of eating.       SLP Plan  Continue with current plan of care       Recommendations  Diet recommendations: Dysphagia 3 (mechanical soft);Honey-thick liquid Liquids provided via: Cup;No straw Medication Administration: Crushed with puree Supervision: Patient able to self feed;Intermittent supervision to cue for compensatory strategies Compensations: Slow rate;Small sips/bites;Clear throat intermittently Postural Changes and/or Swallow Maneuvers: Seated upright 90 degrees                Oral Care  Recommendations: Oral care BID Follow up Recommendations: Skilled Nursing facility SLP Visit Diagnosis: Dysphagia, oropharyngeal phase (R13.12) Plan: Continue with current plan of care                       Houston Siren 12/03/2019, 1:55 PM  Orbie Pyo Colvin Caroli.Ed Risk analyst [phone] Office [phone]

## 2019-12-04 LAB — CBC
HCT: 28.5 % — ABNORMAL LOW (ref 39.0–52.0)
Hemoglobin: 8.9 g/dL — ABNORMAL LOW (ref 13.0–17.0)
MCH: 31.1 pg (ref 26.0–34.0)
MCHC: 31.2 g/dL (ref 30.0–36.0)
MCV: 99.7 fL (ref 80.0–100.0)
Platelets: 231 10*3/uL (ref 150–400)
RBC: 2.86 MIL/uL — ABNORMAL LOW (ref 4.22–5.81)
RDW: 13.6 % (ref 11.5–15.5)
WBC: 12.6 10*3/uL — ABNORMAL HIGH (ref 4.0–10.5)
nRBC: 0 % (ref 0.0–0.2)

## 2019-12-04 LAB — RENAL FUNCTION PANEL
Albumin: 2.6 g/dL — ABNORMAL LOW (ref 3.5–5.0)
Anion gap: 11 (ref 5–15)
BUN: 37 mg/dL — ABNORMAL HIGH (ref 6–20)
CO2: 25 mmol/L (ref 22–32)
Calcium: 8.3 mg/dL — ABNORMAL LOW (ref 8.9–10.3)
Chloride: 98 mmol/L (ref 98–111)
Creatinine, Ser: 4.29 mg/dL — ABNORMAL HIGH (ref 0.61–1.24)
GFR, Estimated: 16 mL/min — ABNORMAL LOW (ref >60)
Glucose, Bld: 235 mg/dL — ABNORMAL HIGH (ref 70–99)
Phosphorus: 3.9 mg/dL (ref 2.5–4.6)
Potassium: 4.4 mmol/L (ref 3.5–5.1)
Sodium: 134 mmol/L — ABNORMAL LOW (ref 135–145)

## 2019-12-04 LAB — GLUCOSE, CAPILLARY
Glucose-Capillary: 142 mg/dL — ABNORMAL HIGH (ref 70–99)
Glucose-Capillary: 223 mg/dL — ABNORMAL HIGH (ref 70–99)
Glucose-Capillary: 336 mg/dL — ABNORMAL HIGH (ref 70–99)
Glucose-Capillary: 382 mg/dL — ABNORMAL HIGH (ref 70–99)
Glucose-Capillary: 75 mg/dL (ref 70–99)

## 2019-12-04 LAB — SARS CORONAVIRUS 2 BY RT PCR (HOSPITAL ORDER, PERFORMED IN ~~LOC~~ HOSPITAL LAB): SARS Coronavirus 2: NEGATIVE

## 2019-12-04 MED ORDER — INSULIN DETEMIR 100 UNIT/ML ~~LOC~~ SOLN: 5.0000 [IU] | Freq: Two times a day (BID) | SUBCUTANEOUS | 0 refills | Status: AC

## 2019-12-04 MED ORDER — PREDNISONE 20 MG PO TABS
40.0000 mg | ORAL_TABLET | Freq: Once | ORAL | Status: AC
  Administered 2019-12-04: 40 mg via ORAL
  Filled 2019-12-04: qty 2

## 2019-12-04 MED ORDER — CEFDINIR 300 MG PO CAPS: 300.0000 mg | ORAL_CAPSULE | Freq: Once | ORAL | 0 refills | Status: AC

## 2019-12-04 MED ORDER — PREDNISONE 20 MG PO TABS: 40.0000 mg | ORAL_TABLET | Freq: Once | ORAL | 0 refills | Status: AC

## 2019-12-04 MED ORDER — DOXYCYCLINE HYCLATE 100 MG PO TABS: 100.0000 mg | ORAL_TABLET | Freq: Once | ORAL | 0 refills | Status: AC

## 2019-12-04 MED ORDER — CEFDINIR 300 MG PO CAPS
300.0000 mg | ORAL_CAPSULE | Freq: Once | ORAL | Status: AC
  Administered 2019-12-04: 300 mg via ORAL
  Filled 2019-12-04: qty 1

## 2019-12-04 NOTE — Progress Notes (Signed)
Report called to April at [facility].  No distress noted. Packet ready facility. Patient belongings packed.

## 2019-12-04 NOTE — Discharge Instructions (Signed)
Thickening Liquids for Dysphagia Diet If you are on the dysphagia diet, you may need to thicken liquids, including drinks, soups, and foods that melt at room temperature. Thickening liquids makes them easier to swallow. It also reduces the risk of liquid traveling to your lungs. How do I thicken a liquid? To thicken a liquid, you will need to add a commercial food thickening product or a soft food to the liquid until it reaches the desired consistency. Liquid consistencies include:  Thin and slightly thick. These liquids include most drinks (such as water, milk, tea, soda, juice, carbonated drinks), as well as melted ice cream, sherbet, sorbet and ice pops, and broth-based soups.  Mild to moderately thick. This includes nectar-like liquids such as maple syrup and creamy soup, and honey-like liquids. These liquids are runny, but thick enough that they cannot be sipped through a straw.  Extremely thick. Also called spoon-thick, these liquids are thick like pudding. How to thicken a liquid with a commercial food and beverage thickener  A commercial food and beverage thickener is a powder or gel that makes a food or beverage thicker. You can buy thickeners at pharmacies, medical supply stores, some grocery stores, and online. They can be added to both hot and cold liquids and do not change the taste of the liquid. Ask your health care provider or dietitian for a complete list of commercial thickeners. Each thickening product is different. Some need to be blended into a liquid with a blender while others can be stirred into a liquid with a fork or spoon. To thicken a liquid using a thickening product, follow the instructions on the product label. How to thicken a liquid with soft foods Some foods such as soups, casseroles, and gravies can be thickened with soft foods. Soft foods include:  Baby cereal.  Gravy powder.  Mashed potato.  Pureed baby food.  Instant potato flakes.  Powdered sauce  mixes (such as cheese mixes).  Flour or corn starch. To thicken a liquid using one of these soft food items, stir or mix the soft food into the liquid until it reaches the desired thickness. Start with a small amount and make adjustments as necessary. Note that flour and corn starch work best with warm liquids, such as broth. To thicken a liquid with flour or corn starch: 1. Make a paste out of flour and water. 2. Cook or warm the liquid and add the paste to it. 3. Stir until the mixture thickens. What are some tips for using thickened liquids?  Take thickeners with you when eating out or traveling.  If a liquid gets too thick, add more of the thinner liquid until the desired consistency is reached.  Consider purchasing premade thickened drinks.  Consider using a thickening product to make your own frozen desserts. What are tips for following this plan?  I should thicken my liquids to a _______________ consistency.  Follow your health care provider or dietitian's recommendation on how to thicken your liquids.  Thicken liquids as told by your health care provider or nutrition specialist (dietitian).  See your health care provider or dietitian regularly for help with your dietary changes. Summary  Thickening liquids makes them easier to swallow. It also reduces the risk of liquid traveling to your lungs. Thicken liquids as told by your health care provider or dietitian.  You can thicken a liquid using a commercial thickening product or by adding a soft food to the liquid until it reaches the desired consistency. This information  is not intended to replace advice given to you by your health care provider. Make sure you discuss any questions you have with your health care provider. Document Revised: 05/15/2018 Document Reviewed: 03/28/2016 Elsevier Patient Education  2020 [place]. Dear [name],   Thank you so much for allowing [place] to be part of your care!  You were admitted  to [facility] for pneumonia.  You were treated with oxygen, steroids, and antibiotics.   POST-HOSPITAL & CARE INSTRUCTIONS 1. You will need to take one more dose of the steroid (prednisone) and one more dose of the antibiotics (cefdinir and doxycycline) tonight. 2. Please let PCP/Specialists know of any changes that were made.  3. Please see medications section of this packet for any medication changes.   DOCTOR'S APPOINTMENT & FOLLOW UP CARE INSTRUCTIONS  No future appointments. Aspiration Precautions, Adult Aspiration is the breathing in (inhalation) of a liquid or object into the lungs. Things that can be inhaled into the lungs include:  Food.  Any type of liquid, such as drinks or saliva.  Stomach contents, such as vomit or stomach acid. What are the signs of aspiration? Signs of aspiration include:  Coughing after swallowing food or liquids.  Clearing the throat often while eating.  Trouble breathing. This may include: ? Breathing quickly. ? Breathing very slowly. ? Loud breathing. ? Rumbling sounds from the lungs while breathing.  Coughing up phlegm (sputum) that: ? Is yellow, tan, or green. ? Has pieces of food in it. ? Is bad-smelling.  Having a hoarse, barky cough.  Not being able to speak.  A hoarse voice.  Drooling while eating.  A feeling of fullness in the throat or a feeling that something is stuck in the throat.  Choking often.  Having a runny noise while eating.  Coughing when lying down or having to sit up quickly after lying down.  A change in skin color. The skin may look red or blue.  Fever.  Watery eyes.  Pain in the chest or back.  A pained look on the face. What are the complications of aspiration? Complications of aspiration include:  Losing weight because the person is not absorbing needed nutrients.  Loss of enjoyment and the social benefits of eating.  Choking.  Lung irritation, if someone aspirates acidic food  or drinks.  Lung infection (pneumonia).  Collection of infected liquid (pus) in the lungs (lung abscess). In serious cases, death can occur. What can I do to prevent aspiration? Caring for someone who has a feeding tube If you are caring for someone who has a feeding tube who cannot eat or drink safely through his or her mouth:  Keep the person in an upright position as much as possible.  Do not lay the person flat if he or she is getting continuous feedings. If you need to lay the person flat for any reason, turn the feeding pump off.  Check feeding tube residuals as told by your health care provider. Ask your health care provider what residual amount is too high. Caring for someone who can eat and drink safely by mouth If you are caring for someone who can eat and drink safely through his or her mouth:  Have the person sit in an upright position when eating food or drinking fluids. This can be done in two ways: ? Have the person sit up in a chair. ? If sitting in a chair is not possible, position the person in bed so he or  she is upright.  Remind the person to eat slowly and chew well. Make sure the person is awake and alert while eating.  Do not distract the person. This is especially important for people with thinking or memory (cognitive) problems.  Allow foods to cool. Hot foods may be more difficult to swallow.  Provide small meals more frequently, instead of 3 large meals. This may reduce fatigue during eating.  Check the person's mouth thoroughly for leftover food after eating.  Keep the person sitting upright for 30-45 minutes after eating.  Do not serve food or drink during 2 hours or more before bedtime. General instructions Follow these general guidelines to prevent aspiration in someone who can eat and drink safely by mouth:  Never put food or liquids in the mouth of a person who is not fully alert.  Feed small amounts of food. Do not force feed.  For a person  who is on a diet for swallowing difficulty (dysphagia diet), follow the recommended food and drink consistency. For example, in dysphagia diet level 1, thicken liquids to pudding-like consistency.  Use as little water as possible when brushing the person's teeth or cleaning his or her mouth.  Provide oral care before and after meals.  Use adaptive devices such as cut-out cups, straws, or utensils as told by the health care provider.  Crush pills and put them in soft food such as pudding or ice cream. Some pills should not be crushed. Check with the health care provider before crushing any medicine. Contact a health care provider if:  The person has a feeding tube, and the feeding tube residual amount is too high.  The person has a fever.  The person tries to avoid food or water, such as refusing to eat, drink, or be fed, or is eating less than normal.  The person may have aspirated food or liquid.  You notice warning signs, such as choking or coughing, when the person eats or drinks. Get help right away if:  The person has trouble breathing or starts to breathe quickly.  The person is breathing very slowly or stops breathing.  The person coughs a lot after eating or drinking.  The person has a long-lasting (chronic) cough.  The person coughs up thick, yellow, or tan sputum.  If someone is choking on food or an object, perform the Heimlich maneuver (abdominal thrusts).  The person has symptoms of pneumonia, such as: ? Coughing a lot. ? Coughing up mucus with a bad smell or blood in it. ? Feeling short of breath. ? Complaining of chest pain. ? Sweating, fever, and chills. ? Feeling tired. ? Complaining of trouble breathing. ? Wheezing.  The person cannot stop choking.  The person is unable to breathe, turns blue, faints, or seems confused. These symptoms may represent a serious problem that is an emergency. Do not wait to see if the symptoms will go away. Get medical help  right away. Call your local emergency services (911 in the [place]  Summary  Aspiration is the breathing in (inhalation) of a liquid or object into the lungs. Things that can be inhaled into the lungs include food, liquids, saliva, or stomach contents.  Aspiration can cause pneumonia or choking.  One sign of aspiration is coughing after swallowing food or liquids.  Contact a health care provider if you notice signs of aspiration. This information is not intended to replace advice given to you by your health care provider. Make sure you discuss any questions  you have with your health care provider. Document Revised: 01/04/2017 Document Reviewed: 10/20/2015 Elsevier Patient Education  [place]:   Take care and be well!  [facility]  [place], [place] [address] [phone]

## 2019-12-04 NOTE — Progress Notes (Signed)
Spoke to Wife, aware of DC plan.

## 2019-12-04 NOTE — TOC Transition Note (Signed)
Transition of Care [facility]) - CM/SW Discharge Note   Patient Details  Name: Jeffery Young MRN: [id number] Date of Birth: 02/18/1965  [facility] CM/SW Contact:  Joanne Chars, LCSW Phone Number: 12/04/2019, 2:44 PM   Clinical Narrative:   Pt discharging to [facility], room 108.  RN call report to [phone].      Final next level of care: Skilled Nursing Facility Barriers to Discharge: Barriers Resolved   Patient Goals and CMS Choice Patient states their goals for this hospitalization and ongoing recovery are:: get back to facility CMS Medicare.gov Compare Post Acute Care list provided to::  (pt is from [place] and wants to return)    Discharge Placement              Patient chooses bed at: [facility] Patient to be transferred to facility by: [place] Name of family member notified: Marval Regal, wife Patient and family notified of of transfer: 12/04/19  Discharge Plan and Services     Post Acute Care Choice: [place]                               Social Determinants of Health (SDOH) Interventions     Readmission Risk Interventions No flowsheet data found.

## 2019-12-04 NOTE — Progress Notes (Signed)
OT Cancellation Note  Patient Details Name: Jeffery Young MRN: [id number] DOB: 28-May-1965   Cancelled Treatment:    Reason Eval/Treat Not Completed: Patient at procedure or test/ unavailable;Medical issues which prohibited therapy;Other (comment) (Dialysis) Pt currently at dialysis; will follow back when on floor.   Corinne Ports E. [place], [facility] [place] 12/04/2019, 10:43 AM

## 2019-12-04 NOTE — Progress Notes (Addendum)
[facility] Progress Note   Subjective:   Completed dialysis yesterday -1.3L UF  Seen in room. No complaints this am. Wants to go home.   Objective Vitals:   12/03/19 2014 12/03/19 2058 12/04/19 0508 12/04/19 0736  BP:  120/64 (!) 142/80   Pulse:  72 87   Resp:  18 20   Temp:  98.3 F (36.8 C) 98.5 F (36.9 C)   TempSrc:  Oral Oral   SpO2: 100% 94% 95% 95%  Weight:      Height:       Physical Exam General: Sitting up in bed, nad  Heart: RRR; no murmur Lungs: diminshed; no wheeze, no rales  Abdomen: soft, non-tender Extremities: No RLE edema, no L stump edema Dialysis Access: LUE AVG + bruit   Additional Objective Labs: Basic Metabolic Panel: Recent Labs  Lab 12/02/19 0300 12/03/19 0349 12/04/19 0203  NA 135 136 134*  K 3.5 3.9 4.4  CL 94* 96* 98  CO2 26 23 25   GLUCOSE 158* 162* 235*  BUN 52* 83* 37*  CREATININE 5.19* 7.09* 4.29*  CALCIUM 8.8* 9.3 8.3*  PHOS 5.8* 6.2* 3.9   Liver Function Tests: Recent Labs  Lab 11/30/19 1350 11/30/19 1350 12/01/19 0512 12/01/19 0512 12/02/19 0300 12/03/19 0349 12/04/19 0203  AST 20  --  21  --   --   --   --   ALT 21  --  19  --   --   --   --   ALKPHOS 81  --  75  --   --   --   --   BILITOT 1.3*  --  1.3*  --   --   --   --   PROT 7.9  --  6.9  --   --   --   --   ALBUMIN 2.9*   < > 2.6*   < > 2.6* 2.7* 2.6*   < > = values in this interval not displayed.   CBC: Recent Labs  Lab 11/30/19 1138 11/30/19 1209 12/01/19 0512 12/01/19 0512 12/01/19 1537 12/02/19 0300 12/03/19 0349  WBC 23.7*   < > 21.0*  --   --  19.8* 17.5*  HGB 11.4*   < > 9.5*   < > 10.1* 9.9* 10.2*  HCT 36.1*   < > 29.7*   < > 31.4* 31.3* 32.8*  MCV 98.9  --  100.3*  --   --  99.1 98.5  PLT 396   < > 298  --   --  304 314   < > = values in this interval not displayed.   Blood Culture    Component Value Date/Time   SDES BLOOD RIGHT ANTECUBITAL 11/30/2019 1235   SPECREQUEST  11/30/2019 1235    BOTTLES DRAWN AEROBIC  AND ANAEROBIC Blood Culture results may not be optimal due to an inadequate volume of blood received in culture bottles   CULT  11/30/2019 1235    NO GROWTH 3 DAYS Performed at [facility], [place] [address] [place], [place] [address]    REPTSTATUS PENDING 11/30/2019 1235   Studies/Results: IR US Guide Vasc Access Left  Result Date: 12/03/2019 INDICATION: 54 year old with end-stage renal disease and occluded left upper arm AV graft. EXAM: DIALYSIS GRAFT DECLOT; GRAFT/VENOUS PTA; PLACEMENT OF VASCULAR STENTS ULTRASOUND GUIDANCE FOR VASCULAR ACCESS Physician: Stephan Minister. Anselm Pancoast, MD MEDICATIONS: None. ANESTHESIA/SEDATION: Heparin 3000 units, TPA 2 mg, Versed 1.0 mg, Fentanyl 50 mcg Moderate Sedation Time:  2 hours and 20 minutes The patient was continuously monitored during the procedure by the interventional radiology nurse under my direct supervision. FLUOROSCOPY TIME:  Fluoroscopy Time: 60 minutes, 42 seconds, 24 mGy CONTRAST:  64 mL Omnipaque 315 COMPLICATIONS: None immediate. PROCEDURE: The procedure was explained to the patient. The risks and benefits of the procedure were discussed and the patient's questions were addressed. Informed consent was obtained from the patient. Left arm was evaluated with ultrasound. The left arm graft was occluded. Ultrasound image was saved for documentation. Left upper arm was prepped and draped in sterile fashion. Maximal barrier sterile technique was utilized including caps, mask, sterile gowns, sterile gloves, sterile drape, hand hygiene and skin antiseptic. Skin was anesthetized along the proximal and distal aspects of the graft. A 21 gauge needle was directed into the distal aspect of the graft pointing towards the central veins using ultrasound guidance. Micropuncture dilator set was placed. 6 French vascular sheath was placed over a superstiff Amplatz wire. A 5 French catheter and Glidewire were advanced into the central veins and central venogram was performed. A  pull-back injection confirmed clot within the graft. 2 mg of tPA was infused along the length of the graft. At this time, a prominent pseudoaneurysm along the proximal aspect of the graft or venous end of the graft was identified. Pseudoaneurysm location corresponded with previous puncture sites. Mechanical thrombectomy was performed using Angiojet device. Using ultrasound guidance, 21 gauge needle was directed into the graft distal to the pseudoaneurysm area. Micropuncture dilator set was placed and upsized to a 6 [place] vascular sheath. A Fogarty balloon was used to pull the arterial plug and flow was restored within the graft. Multiple shuntogram images were obtained. The venous anastomosis and outflow portion of the graft were treated with 6 mm x 40 mm balloon. At this point, there was marked filling of the large pseudoaneurysm along the proximal or venous end of the graft. Decided the pseudoaneurysm needed to be excluded with a covered stent. The sheath pointing towards the arterial anastomosis was removed using a pursestring suture and there was hemostasis. Additional shuntogram images were performed. At this point, the patient had a significant coughing spell and it took quite a while for the patient to recover and proceed with the procedure. A covered 7 mm x 60 mm Fluency Plus stent was deployed along the venous limb the graft. Attempted to place the stent in a location that would preserve as much graft as possible for future dialysis access. The stent graft was deployed along the venous limb of the graft but unfortunately there was residual filling of the pseudoaneurysm. Graft was angioplastied with the 6 mm balloon but follow-up shuntogram images again demonstrated filling of the pseudoaneurysm. Therefore, a 7 mm x 60 mm Covera stent was deployed in overlapping fashion through the existing stent. Follow-up shuntogram images demonstrated exclusion of the pseudoaneurysm. The new stent was dilated up to 6 mm.  Final shuntogram images were obtained. The 8 French vascular sheath was removed using a pursestring suture. FINDINGS: The left upper arm AV graft was completely occluded at the beginning the procedure. Central veins were patent. A large pseudoaneurysm was identified along the proximal/venous portion of the graft at the beginning of the procedure. The pseudoaneurysm was pre-existing. After flow was restored in the graft, there was significant filling of the pseudoaneurysm. Incomplete exclusion of the pseudoaneurysm after deployment of the first stent. Complete exclusion of the pseudoaneurysm after deployment of the second stent. Graft was widely patent  at the end of the procedure. IMPRESSION: Successful declot of the left upper arm AV graft. Pseudoaneurysm along the proximal/venous aspect of the graft was excluded using covered stents. The graft is ready to be used but recommend avoiding puncture of the stented graft. Stented portion of the graft was marked on the skin and there is also a palpable difference between the stented area and the unstented graft on examination. Electronically Signed   By: Markus Daft M.D.   On: 12/03/2019 13:28   DG Swallowing Func-Speech Pathology  Result Date: 12/02/2019 Objective Swallowing Evaluation: Type of Study: MBS-Modified Barium Swallow Study  Patient Details Name: Jahmeir Geisen MRN: [id number] Date of Birth: October 14, 1965 Today's Date: 12/02/2019 Time: SLP Start Time (ACUTE ONLY): 2951 -SLP Stop Time (ACUTE ONLY): 0927 SLP Time Calculation (min) (ACUTE ONLY): 23 min Past Medical History: Past Medical History: Diagnosis Date . Anemia of renal disease 05/28/2017 . Chest pain 01/10/2016 . Chronic combined systolic and diastolic heart failure ([place])  . Coronary artery disease   a.12/22/15: NSTEMI s/p overlapping DES x2 and balloon angioplasty to distal AV groove Circumflex (too small for a stent).  . Coronary artery disease due to lipid rich plaque 01/10/2016 . Dehydration 08/22/2012 .  Depression  . Elevated troponin  . ESRD (end stage renal disease) on dialysis ([place])  . Essential hypertension  . History of completed stroke 05/28/2017 . Hypercholesteremia  . Hypertension  . Hypertensive heart disease with heart failure ([place])  . Ischemic cardiomyopathy 03/13/2016 . Normocytic anemia 05/28/2017 . NSTEMI (non-ST elevated myocardial infarction) ([place]) 12/22/2015 . Prostate infection  . Status post coronary artery stent placement  . Syncope 03/13/2016 . Tobacco abuse  . Type 2 diabetes mellitus with diabetic nephropathy, without long-term current use of insulin ([place])  . Type II diabetes mellitus ([place])  . Uncontrolled hypertension 05/28/2017 . Unstable angina pectoris ([place]) 01/11/2016 Past Surgical History: Past Surgical History: Procedure Laterality Date . BASCILIC VEIN TRANSPOSITION Left 06/28/2017  Procedure: LEFT UPPER ARM ARTERIOVENOUS GORTEX-GRAFT PLACEMENT;  Surgeon: Angelia Mould, MD;  Location: [place];  Service: Vascular;  Laterality: Left; . CARDIAC CATHETERIZATION N/A 12/22/2015  Procedure: Left Heart Cath and Coronary Angiography;  Surgeon: Burnell Blanks, MD;  Location: [facility];  Service: Cardiovascular;  Laterality: N/A; . CARDIAC CATHETERIZATION N/A 12/22/2015  Procedure: Coronary Stent Intervention;  Surgeon: Burnell Blanks, MD;  Location: [place];  Service: Cardiovascular;  Laterality: N/A; . CARDIAC CATHETERIZATION N/A 01/11/2016  Procedure: Left Heart Cath and Coronary Angiography;  Surgeon: Sherren Mocha, MD;  Location: [place] [facility];  Service: Cardiovascular;  Laterality: N/A; . CARDIAC CATHETERIZATION N/A 01/11/2016  Procedure: Intravascular Pressure Wire/FFR Study;  Surgeon: Sherren Mocha, MD;  Location: [place];  Service: Cardiovascular;  Laterality: N/A; . CARDIAC CATHETERIZATION N/A 01/11/2016  Procedure: Coronary Stent Intervention;  Surgeon: Sherren Mocha, MD;  Location: [place] [facility];  Service: Cardiovascular;   Laterality: N/A; . CORONARY ANGIOPLASTY WITH STENT PLACEMENT  12/22/2015 . INSERTION OF DIALYSIS CATHETER Right 06/28/2017  Procedure: INSERTION OF DIALYSIS CATHETER RIGHT INTERNAL JUGULAR;  Surgeon: Angelia Mould, MD;  Location: [facility] OR;  Service: Vascular;  Laterality: Right; HPI: 54 yr old admitted with worsening dyspnea, afebrile and productive cough from [facility], Per chart O2 sat noted to be 55% on room air. COVID negative. Found to have CAP. PMH: basal ganglia/pons stroke 2019, ESRD, HTN, DM, NSTEMI, CAD. CXR RRL pna. Outpatient MBS 10/08/19 >pt reported hx of dysphagia in 2019.  Found to have delayed pharyngeal swallow with penetration (PAS 3) of large boluses. Reg/thin recommended. Pt admitted to fast rate of eating.  No data recorded Assessment / Plan / Recommendation CHL IP CLINICAL IMPRESSIONS 12/02/2019 Clinical Impression Pt has a history of oropharyngeal dysphagia following stroke in 2019 and continued during today's study. Significant pharyngeal congestion noted yesterday during BSE however today respirations subjectively clear without audible congestion. Oral impairments are mild and marked by difficulty coordinating and timing propulsion. Motor strength was within normal limits. Timing is delayed in regards to reflexive onset of laryngeal closure resulting in barium reaching and remaining in valleculae for 7 seconds and pyriform sinuses up to 10 seconds. His pyriform sinuses filled leading to barium spilling into laryngeal vestibule and vocal cords during swallow. The thicker the consistency, the less barium able to reach lower pharynx. Verbal cues given to propel and attempt to initiate swallow faster without significant difference and chick tuck posture ineffective strategy. Sensation and awareness of vocal cord penetration was inconsistent and decreased. Honey thick texture better coordinated, barium upper pharynx when initiated and no pharyngeal residue throughout this  study. Recommended honey thick liquids, small sips, Dys 3 texture, volitional cough, crush meds and full supervision.      SLP Visit Diagnosis Dysphagia, oropharyngeal phase (R13.12) Attention and concentration deficit following -- Frontal lobe and executive function deficit following -- Impact on safety and function Moderate aspiration risk   CHL IP TREATMENT RECOMMENDATION 12/02/2019 Treatment Recommendations Therapy as outlined in treatment plan below   Prognosis 12/02/2019 Prognosis for Safe Diet Advancement (No Data) Barriers to Reach Goals Cognitive deficits Barriers/Prognosis Comment -- CHL IP DIET RECOMMENDATION 12/02/2019 SLP Diet Recommendations Dysphagia 3 (Mech soft) solids;Honey thick liquids Liquid Administration via Cup Medication Administration Crushed with puree Compensations Minimize environmental distractions;Slow rate;Small sips/bites;Lingual sweep for clearance of pocketing;Clear throat intermittently Postural Changes Seated upright at 90 degrees   CHL IP OTHER RECOMMENDATIONS 12/02/2019 Recommended Consults -- Oral Care Recommendations Oral care BID Other Recommendations Order thickener from pharmacy   CHL IP FOLLOW UP RECOMMENDATIONS 12/02/2019 Follow up Recommendations Skilled Nursing facility   [facility] IP FREQUENCY AND DURATION 12/02/2019 Speech Therapy Frequency (ACUTE ONLY) min 2x/week Treatment Duration 2 weeks      CHL IP ORAL PHASE 12/02/2019 Oral Phase Impaired Oral - Pudding Teaspoon -- Oral - Pudding Cup -- Oral - Honey Teaspoon -- Oral - Honey Cup Reduced posterior propulsion Oral - Nectar Teaspoon -- Oral - Nectar Cup Reduced posterior propulsion Oral - Nectar Straw -- Oral - Thin Teaspoon -- Oral - Thin Cup -- Oral - Thin Straw -- Oral - Puree WFL Oral - Mech Soft -- Oral - Regular WFL Oral - Multi-Consistency -- Oral - Pill -- Oral Phase - Comment --  CHL IP PHARYNGEAL PHASE 12/02/2019 Pharyngeal Phase Impaired Pharyngeal- Pudding Teaspoon -- Pharyngeal -- Pharyngeal- Pudding Cup --  Pharyngeal -- Pharyngeal- Honey Teaspoon -- Pharyngeal -- Pharyngeal- Honey Cup Delayed swallow initiation-vallecula Pharyngeal -- Pharyngeal- Nectar Teaspoon -- Pharyngeal -- Pharyngeal- Nectar Cup Delayed swallow initiation-pyriform sinuses;Penetration/Aspiration during swallow Pharyngeal Material enters airway, remains ABOVE vocal cords and not ejected out;Material enters airway, CONTACTS cords and not ejected out Pharyngeal- Nectar Straw -- Pharyngeal -- Pharyngeal- Thin Teaspoon -- Pharyngeal -- Pharyngeal- Thin Cup Penetration/Aspiration during swallow;Delayed swallow initiation-pyriform sinuses Pharyngeal Material enters airway, remains ABOVE vocal cords then ejected out;Material enters airway, remains ABOVE vocal cords and not ejected out Pharyngeal- Thin Straw -- Pharyngeal -- Pharyngeal- Puree Delayed swallow initiation-vallecula Pharyngeal -- Pharyngeal- Mechanical Soft --  Pharyngeal -- Pharyngeal- Regular WFL Pharyngeal -- Pharyngeal- Multi-consistency -- Pharyngeal -- Pharyngeal- Pill -- Pharyngeal -- Pharyngeal Comment --  CHL IP CERVICAL ESOPHAGEAL PHASE 12/02/2019 Cervical Esophageal Phase WFL Pudding Teaspoon -- Pudding Cup -- Honey Teaspoon -- Honey Cup -- Nectar Teaspoon -- Nectar Cup -- Nectar Straw -- Thin Teaspoon -- Thin Cup -- Thin Straw -- Puree -- Mechanical Soft -- Regular -- Multi-consistency -- Pill -- Cervical Esophageal Comment -- Houston Siren 12/02/2019, 10:42 AM Orbie Pyo Colvin Caroli.Ed Actor Pager [phone] Office [phone]              IR THROMBECTOMY AV FISTULA W/THROMBOLYSIS/PTA/STENT INC SHUNT IMG LT  Result Date: 12/03/2019 INDICATION: 54 year old with end-stage renal disease and occluded left upper arm AV graft. EXAM: DIALYSIS GRAFT DECLOT; GRAFT/VENOUS PTA; PLACEMENT OF VASCULAR STENTS ULTRASOUND GUIDANCE FOR VASCULAR ACCESS Physician: Stephan Minister. Anselm Pancoast, MD MEDICATIONS: None. ANESTHESIA/SEDATION: Heparin 3000 units, TPA 2 mg, Versed 1.0 mg,  Fentanyl 50 mcg Moderate Sedation Time:  2 hours and 20 minutes The patient was continuously monitored during the procedure by the interventional radiology nurse under my direct supervision. FLUOROSCOPY TIME:  Fluoroscopy Time: 60 minutes, 42 seconds, 24 mGy CONTRAST:  64 mL Omnipaque [id number] COMPLICATIONS: None immediate. PROCEDURE: The procedure was explained to the patient. The risks and benefits of the procedure were discussed and the patient's questions were addressed. Informed consent was obtained from the patient. Left arm was evaluated with ultrasound. The left arm graft was occluded. Ultrasound image was saved for documentation. Left upper arm was prepped and draped in sterile fashion. Maximal barrier sterile technique was utilized including caps, mask, sterile gowns, sterile gloves, sterile drape, hand hygiene and skin antiseptic. Skin was anesthetized along the proximal and distal aspects of the graft. A 21 gauge needle was directed into the distal aspect of the graft pointing towards the central veins using ultrasound guidance. Micropuncture dilator set was placed. 6 French vascular sheath was placed over a superstiff Amplatz wire. A 5 French catheter and Glidewire were advanced into the central veins and central venogram was performed. A pull-back injection confirmed clot within the graft. 2 mg of tPA was infused along the length of the graft. At this time, a prominent pseudoaneurysm along the proximal aspect of the graft or venous end of the graft was identified. Pseudoaneurysm location corresponded with previous puncture sites. Mechanical thrombectomy was performed using Angiojet device. Using ultrasound guidance, 21 gauge needle was directed into the graft distal to the pseudoaneurysm area. Micropuncture dilator set was placed and upsized to a 6 [place] vascular sheath. A Fogarty balloon was used to pull the arterial plug and flow was restored within the graft. Multiple shuntogram images were obtained.  The venous anastomosis and outflow portion of the graft were treated with 6 mm x 40 mm balloon. At this point, there was marked filling of the large pseudoaneurysm along the proximal or venous end of the graft. Decided the pseudoaneurysm needed to be excluded with a covered stent. The sheath pointing towards the arterial anastomosis was removed using a pursestring suture and there was hemostasis. Additional shuntogram images were performed. At this point, the patient had a significant coughing spell and it took quite a while for the patient to recover and proceed with the procedure. A covered 7 mm x 60 mm Fluency Plus stent was deployed along the venous limb the graft. Attempted to place the stent in a location that would preserve as much graft as possible for future dialysis access.  The stent graft was deployed along the venous limb of the graft but unfortunately there was residual filling of the pseudoaneurysm. Graft was angioplastied with the 6 mm balloon but follow-up shuntogram images again demonstrated filling of the pseudoaneurysm. Therefore, a 7 mm x 60 mm Covera stent was deployed in overlapping fashion through the existing stent. Follow-up shuntogram images demonstrated exclusion of the pseudoaneurysm. The new stent was dilated up to 6 mm. Final shuntogram images were obtained. The 8 French vascular sheath was removed using a pursestring suture. FINDINGS: The left upper arm AV graft was completely occluded at the beginning the procedure. Central veins were patent. A large pseudoaneurysm was identified along the proximal/venous portion of the graft at the beginning of the procedure. The pseudoaneurysm was pre-existing. After flow was restored in the graft, there was significant filling of the pseudoaneurysm. Incomplete exclusion of the pseudoaneurysm after deployment of the first stent. Complete exclusion of the pseudoaneurysm after deployment of the second stent. Graft was widely patent at the end of the  procedure. IMPRESSION: Successful declot of the left upper arm AV graft. Pseudoaneurysm along the proximal/venous aspect of the graft was excluded using covered stents. The graft is ready to be used but recommend avoiding puncture of the stented graft. Stented portion of the graft was marked on the skin and there is also a palpable difference between the stented area and the unstented graft on examination. Electronically Signed   By: Markus Daft M.D.   On: 12/03/2019 13:28   Medications:  . aspirin  81 mg Oral Daily  . atorvastatin  10 mg Oral Daily  . carvedilol  6.25 mg Oral BID WC  . cefdinir  300 mg Oral Once  . Chlorhexidine Gluconate Cloth  6 each Topical Q0600  . doxycycline  100 mg Oral Q12H  . heparin injection (subcutaneous)  5,000 Units Subcutaneous Q8H  . insulin aspart  0-15 Units Subcutaneous TID WC  . insulin aspart  0-5 Units Subcutaneous QHS  . insulin detemir  7 Units Subcutaneous BID  . mometasone-formoterol  2 puff Inhalation BID  . predniSONE  40 mg Oral Q breakfast  . predniSONE  40 mg Oral Once  . sertraline  100 mg Oral Daily  . sevelamer carbonate  2,400 mg Oral TID WC  . sodium chloride flush  3 mL Intravenous Q12H  . umeclidinium bromide  1 puff Inhalation Daily    Dialysis Orders: MWF at [facility] - full HD 10/22, left 1kg up 3:45hr, 400/600, EDW 56kg, 2K/2Ca, AVG, no heparin - Mircera 44mcg IV q 4 weeks - Calcitriol 1.14mcg PO q HD - Venofer 50mg  IV weekly  Assessment/Plan: 1. RLL pneumonia: started on Vanc/Cefepime/Doxy + steroids + duo-nebs.  BC Neg to date. Hx COPD. Now transitioned to oral antibiotics --per primary team.  2. Acute/ chronic hypoxic Resp Failure: Symptoms and O2 requirement improving. 3. ^ Troponin:Suspect demand ischemia - per primary team. 4. ESRD:Usual MWF sched.  S/p IR declot of AVG 10/28. HD on regular schedule today. Appreciate IR for declotting. 5. Hypertension/volume:BP high - no edema on exam. Below EDW,  challenging down as tolerated. Will lower EDW at discharge.  6. Anemiaof ESRD:Hgb 10.2 - follow. 7. Metabolic bone disease: CorrCa high, Phos high. Resuming binders, holding VDRA for now. 8. T2DM 9. CAD/Hx stents 10. Hx CVA 11. COPD/Former heavy tobacco use  Lynnda Child PA-C [facility] 12/04/2019,8:43 AM  I have seen and examined this patient and agree with the plan of care.  I personally removed the sutures today from the declot; no oozing. Great thrill. Will need rope ladder cannulation and will be close but should be enough length for successful cannulation. I agree with VIR and would not cannulate the stents.  Dwana Melena, MD 12/04/2019, 10:19 AM

## 2019-12-04 NOTE — Discharge Summary (Addendum)
[facility] Discharge Summary  Patient name: Jeffery Young Medical record number: [id number] Date of birth: 1965-09-01 Age: 54 y.o. Gender: male Date of Admission: 11/30/2019  Date of Discharge: 12/04/2019  Admitting Physician: Donney Dice, DO  Primary Care Provider: [name], MD Consultants: Nephrology, CCM  Indication for Hospitalization: Pneumonia  Discharge Diagnoses/Problem List:  Principal Problem:   HCAP (healthcare-associated pneumonia) Active Problems:   Type 2 diabetes mellitus with diabetic nephropathy, without long-term current use of insulin ([place])   Essential hypertension   Chronic combined systolic and diastolic heart failure ([place])   Depression   Anemia of renal disease   History of completed stroke   ESRD (end stage renal disease) on dialysis ([place])   COPD GOLD III    Acute respiratory failure with hypoxemia ([place])   Metabolic bone disease   Lives in long-term care facility   Delirium due to general medical condition   Chronic hypercapnic respiratory failure ([place])   Hyperlipidemia associated with type 2 diabetes mellitus ([place])    Disposition: SNF  Discharge Condition: Stable  Discharge Exam:  General: Sitting up in bed comfortably, NAD Cardiovascular: RRR, no murmurs Respiratory: faint rales heard in the RLL, no rhonchi, no signs of respiratory distress, breathing comfortably on room air  Abdomen: soft, non-tender, +BS Extremities: WWP, no edema Neuro: A&Ox2 (thought year was 2023)   Brief Hospital Course:  Jeffery Young is a 54 y.o. male with a history of ESRD, HTN, T2DM, HLD, and CVA who presented with acute hypoxic respiratory failure secondary to pneumonia. Hospital course outlined by problem below:  Acute hypoxic respiratory failure  Pneumonia  Patient was initially brought into the ED by EMS for dyspnea, found to be hypoxic to 55% on room air upon EMS arrival, improved to 95% on 10 L NRB.  Patient was recently  diagnosed with pneumonia and had been prescribed amoxicillin-clavulanate on 10/19.  On evaluation in the ED, patient was mildly tachycardic and tachypneic with increased work of breathing.  Patient was requiring up to 20L high-flow [place] initially.  Labs significant for leukocytosis (~WBC 24). CXR consistent with right lower lobe pneumonia.  CTA Chest was negative for PE and with findings consistent with right lower lobe pneumonia, possible aspiration component. He gradually improved with antibiotics and steroids (to cover potential COPD exacerbation), and he was able to be weaned to room air prior to discharge. Pt to complete  5-day course of high-dose steroids and 5-day antibiotic course (patient has one additional dose of Cefdinir, doxycycline and prednisone).  Antibiotics: Vancomycin (10/25-10/27) Cefepime (10/25-10/28) Cefdinir (12-02-08/29) (needs one more dose) Azithromycin (10/25) Doxycycline (10/26-10/29), final dose 10/29 PM  Steroids: IV methylprednisolone (10/25-10/27) Prednisone 40 mg (10/28-10/29), needs one more dose  ESRD On MWF HD. Missed dialysis on 10/25, so had HD on 10/26 before resuming normal HD schedule. His LUE AV fisula clotted on 10/27, so he was not able to complete HD. Patient underwent uncomplicated declotting procedure on 10/28 by IR, then underwent successful HD that day and again on 10/29.  Dysphagia Evaluated by SLP and underwent MBS, recommended dysphagia 3 diet with honey thick liquids.  T2DM No antihyperglycemic medications prior to admission.  Blood sugar was controlled with sliding scale insulin and basal insulin.  He was adequately controlled with Levemir 7 units twice a day and moderate sliding scale insulin with nightly correction.  Decreased to Levemir 5 units twice a day on discharge now that patient is completing steroid course. Recommend titrating as needed.  Home medications were continued for other chronic diseases which remained stable.      Issues for Follow Up:  1. Discharged on Levemir 5 units BID (not previously on antihyperglycemics PTA), adjust as necessary now that he is completing steroid course 10/29. 2. Will need one more prednisone 40 mg dose this evening (10/29) to complete 5-day course 3. Will need one dose of cefdinir and one dose of doxycycline this evening (10/29) to complete 5-day course 4. Recommended dysphagia 3 diet per SLP 5. Consider outpatient CXR to evaluate for pneumonia resolution in 1 month  Significant Procedures:  10/28 Left upper extremity AV graft declotting by IR  Significant Labs and Imaging:  Recent Labs  Lab 12/02/19 0300 12/03/19 0349 12/04/19 1144  WBC 19.8* 17.5* 12.6*  HGB 9.9* 10.2* 8.9*  HCT 31.3* 32.8* 28.5*  PLT 304 314 231   Recent Labs  Lab 11/30/19 1350 11/30/19 1350 12/01/19 0137 12/01/19 0137 12/01/19 0512 12/01/19 0512 12/02/19 0300 12/02/19 0300 12/03/19 0349 12/04/19 0203  NA 128*   < > 126*  --  129*  --  135  --  136 134*  K 4.5   < > 4.1   < > 4.5   < > 3.5   < > 3.9 4.4  CL 81*  --   --   --  84*  --  94*  --  96* 98  CO2 28  --   --   --  25  --  26  --  23 25  GLUCOSE 330*  --   --   --  280*  --  158*  --  162* 235*  BUN 86*  --   --   --  105*  --  52*  --  83* 37*  CREATININE 8.16*  --   --   --  8.98*  --  5.19*  --  7.09* 4.29*  CALCIUM 9.5  --   --   --  9.2  --  8.8*  --  9.3 8.3*  MG  --   --   --   --  2.5*  --   --   --   --   --   PHOS  --   --   --   --  6.6*  --  5.8*  --  6.2* 3.9  ALKPHOS 81  --   --   --  75  --   --   --   --   --   AST 20  --   --   --  21  --   --   --   --   --   ALT 21  --   --   --  19  --   --   --   --   --   ALBUMIN 2.9*  --   --   --  2.6*  --  2.6*  --  2.7* 2.6*   < > = values in this interval not displayed.     Results/Tests Pending at Time of Discharge: none  Discharge Medications:  Allergies as of 12/04/2019   No Known Allergies     Medication List    STOP taking these medications    amoxicillin-clavulanate 500-125 MG tablet Commonly known as: AUGMENTIN     TAKE these medications   ADVAIR DISKUS IN Inhale 1 puff into the lungs 2 (two) times daily.   albuterol 108 (90 Base) MCG/ACT inhaler Commonly  known as: VENTOLIN HFA Inhale 2 puffs into the lungs every 6 (six) hours as needed for wheezing or shortness of breath.   aspirin 81 MG chewable tablet CHEW ONE TABLET BY MOUTH DAILY What changed: See the new instructions.   atorvastatin 10 MG tablet Commonly known as: LIPITOR Take 1 tablet (10 mg total) by mouth daily. Please call to schedule appointment for future refills. What changed: additional instructions   calcitRIOL 0.5 MCG capsule Commonly known as: ROCALTROL TAKE 1 CAPSULE BY MOUTH ON MONDAY, WEDNESDAY, AND FRIDAY WITH HEMODIALYSIS What changed: See the new instructions.   carvedilol 6.25 MG tablet Commonly known as: COREG TAKE ONE TABLET BY MOUTH TWICE A DAY WITH A MEAL What changed: See the new instructions.   cefdinir 300 MG capsule Commonly known as: OMNICEF Take 1 capsule (300 mg total) by mouth once for 1 dose.   clopidogrel 75 MG tablet Commonly known as: PLAVIX Take 1 tablet (75 mg total) by mouth daily. Please make overdue appt with Dr. Angelena Form before anymore refills. 1st attempt What changed: additional instructions   docusate sodium 100 MG capsule Commonly known as: COLACE Take 100 mg by mouth 2 (two) times daily. What changed: Another medication with the same name was removed. Continue taking this medication, and follow the directions you see here.   doxycycline 100 MG tablet Commonly known as: VIBRA-TABS Take 1 tablet (100 mg total) by mouth once for 1 dose. Due at 10:00 PM.   fluticasone 50 MCG/ACT nasal spray Commonly known as: FLONASE Place 1-2 sprays into both nostrils daily.   glucose blood test strip Commonly known as: OneTouch Verio 1 each by Other route as needed for other. Use as instructed 1-2 times per day.    guaiFENesin 600 MG 12 hr tablet Commonly known as: MUCINEX Take 600 mg by mouth 2 (two) times daily.   Incruse Ellipta 62.5 MCG/INH Aepb Generic drug: umeclidinium bromide Inhale 1 puff into the lungs daily.   insulin detemir 100 UNIT/ML injection Commonly known as: LEVEMIR Inject 0.05 mLs (5 Units total) into the skin 2 (two) times daily.   nitroGLYCERIN 0.4 MG SL tablet Commonly known as: NITROSTAT Place 1 tablet (0.4 mg total) under the tongue every 5 (five) minutes x 3 doses as needed for chest pain. What changed:   when to take this  reasons to take this   OneTouch Delica Plus [id number] Misc USE ONELANCET TO TEST - FOUR TIMES A DAY   predniSONE 20 MG tablet Commonly known as: DELTASONE Take 2 tablets (40 mg total) by mouth once for 1 dose. What changed:   how much to take  when to take this   PROBIOTIC ACIDOPHILUS BEADS PO Take 1 capsule by mouth 2 (two) times daily.   Rena-Vite Rx 1 MG Tabs TAKE ONE TABLET BY MOUTH AT BEDTIME   sertraline 100 MG tablet Commonly known as: ZOLOFT TAKE ONE TABLET BY MOUTH DAILY   sevelamer carbonate 800 MG tablet Commonly known as: RENVELA Take 1 tablet (800 mg total) by mouth 3 (three) times daily with meals. What changed: how much to take   traZODone 50 MG tablet Commonly known as: [place] TABLET BY MOUTH EVERY NIGHT AT BEDTIME            Discharge Care Instructions  (From admission, onward)         Start     Ordered   12/04/19 0000  If the dressing is still on your incision site when you go  home, remove it on the third day after your surgery date. Remove dressing if it begins to fall off, or if it is dirty or damaged before the third day.        12/04/19 1438          Discharge Instructions: Please refer to Patient Instructions section of EMR for full details.  Patient was counseled important signs and symptoms that should prompt return to medical care, changes in medications, dietary  instructions, activity restrictions, and follow up appointments.   Follow-Up Appointments:  Contact information for after-discharge care    Destination    [facility] .   Service: Skilled Nursing Contact information: [address] [place] [phone]                  Zola Button, MD 12/04/2019, 2:38 PM PGY-1, [place], DO PGY-2, [facility] 12/04/2019 2:52 PM

## 2019-12-04 NOTE — Progress Notes (Signed)
Physical Therapy Treatment Patient Details Name: Jeffery Young MRN: [id number] DOB: 07-27-1965 Today's Date: 12/04/2019    History of Present Illness Pt is a 54 y.o. male admitted from LTC facility on 11/30/19 with worsening dyspnea. Found to have RLL PNA. Chest angio 10/26 negative for acute PE; findingsare consistent with infectious bronchiolitis or aspiration. 10/28 LUE AV graft declot. 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    PT Comments    Pt rising from bed on arrival and very agreeable to gait and mobility. Pt with decreased awareness of wet pad on bed with dry pad provided. Pt able to increase gait and mobility this session with pt very soft spoken. Pt reports he normally doesn't walk much but that he can speak louder. Session limited by transport to HD.     Follow Up Recommendations  SNF;Supervision for mobility/OOB     Equipment Recommendations  Rolling walker with 5" wheels    Recommendations for Other Services       Precautions / Restrictions Precautions Precautions: Fall;Other (comment) Precaution Comments: H/o stroke with chronic L-side weakness; h/o bladder/bowel incontinence (pt reports typically wears diaper) Restrictions Weight Bearing Restrictions: No    Mobility  Bed Mobility Overal bed mobility: Needs Assistance Bed Mobility: Supine to Sit;Sit to Supine     Supine to sit: Supervision Sit to supine: Min assist   General bed mobility comments: pt able to transition to sitting with use of rail and HOB 30 degrees. return to bed pt required assist to lift legs to surface  Transfers Overall transfer level: Needs assistance   Transfers: Sit to/from Stand Sit to Stand: Min guard         General transfer comment: pt able to stand from bed and to bed without assist with cues for safety  Ambulation/Gait Ambulation/Gait  assistance: Min guard Gait Distance (Feet): 60 Feet Assistive device: Rolling walker (2 wheeled) Gait Pattern/deviations: Step-to pattern;Decreased weight shift to left;Decreased dorsiflexion - left;Trunk flexed   Gait velocity interpretation: 1.31 - 2.62 ft/sec, indicative of limited community ambulator General Gait Details: cues for direction and avoiding obstacles as well as direction to room. Pt fatigued quickly with need for cues to limit distance   Stairs             Wheelchair Mobility    Modified Rankin (Stroke Patients Only)       Balance Overall balance assessment: Needs assistance   Sitting balance-Leahy Scale: Fair       Standing balance-Leahy Scale: Poor Standing balance comment: bil UE support on RW in standing                            Cognition Arousal/Alertness: Awake/alert Behavior During Therapy: Flat affect Overall Cognitive Status: No family/caregiver present to determine baseline cognitive functioning Area of Impairment: Following commands;Safety/judgement;Problem solving                       Following Commands: Follows one step commands consistently Safety/Judgement: Decreased awareness of deficits;Decreased awareness of safety   Problem Solving: Requires verbal cues General Comments: pt with cues for safety, moving RW away from obstacles and recognition of fatigue      Exercises      General Comments        Pertinent Vitals/Pain Pain Assessment: No/denies pain    Home Living  Prior Function            PT Goals (current goals can now be found in the care plan section) Progress towards PT goals: Progressing toward goals    Frequency    Min 3X/week      PT Plan Current plan remains appropriate    Co-evaluation              AM-PAC PT "6 Clicks" Mobility   Outcome Measure  Help needed turning from your back to your side while in a flat bed without using  bedrails?: A Little Help needed moving from lying on your back to sitting on the side of a flat bed without using bedrails?: A Little Help needed moving to and from a bed to a chair (including a wheelchair)?: A Little Help needed standing up from a chair using your arms (e.g., wheelchair or bedside chair)?: A Little Help needed to walk in hospital room?: A Little Help needed climbing 3-5 steps with a railing? : A Lot 6 Click Score: 17    End of Session Equipment Utilized During Treatment: Gait belt Activity Tolerance: Patient tolerated treatment well Patient left: in bed (with transport provided to go to HD) Nurse Communication: Mobility status PT Visit Diagnosis: Other abnormalities of gait and mobility (R26.89);Muscle weakness (generalized) (M62.81)     Time: 1014-1030 PT Time Calculation (min) (ACUTE ONLY): 16 min  Charges:  $Gait Training: 8-22 mins                     Merryl Hacker, PT Acute Rehabilitation Services Pager: [phone] Office: [phone]    Enedina Finner Lorri Fukuhara 12/04/2019, 12:53 PM

## 2019-12-05 LAB — CULTURE, BLOOD (ROUTINE X 2)
Culture: NO GROWTH
Culture: NO GROWTH

## 2019-12-07 ENCOUNTER — Other Ambulatory Visit: Payer: Self-pay | Admitting: Family Medicine

## 2019-12-07 DIAGNOSIS — F0631 Mood disorder due to known physiological condition with depressive features: Secondary | ICD-10-CM

## 2021-07-11 ENCOUNTER — Ambulatory Visit: Payer: BC Managed Care – PPO | Admitting: Podiatry

## 2021-07-27 ENCOUNTER — Ambulatory Visit: Payer: BC Managed Care – PPO | Admitting: Podiatry

## 2021-10-06 DEATH — deceased
